# Patient Record
Sex: Female | Born: 1940 | Race: White | Hispanic: No | Smoking: Never smoker
Health system: Southern US, Community
[De-identification: ages and names within clinical notes are randomized; demographics above are authoritative.]

## PROBLEM LIST (undated history)

## (undated) DIAGNOSIS — C801 Malignant (primary) neoplasm, unspecified: Secondary | ICD-10-CM

## (undated) DIAGNOSIS — F329 Major depressive disorder, single episode, unspecified: Secondary | ICD-10-CM

## (undated) DIAGNOSIS — I209 Angina pectoris, unspecified: Secondary | ICD-10-CM

## (undated) DIAGNOSIS — M549 Dorsalgia, unspecified: Secondary | ICD-10-CM

## (undated) DIAGNOSIS — I10 Essential (primary) hypertension: Secondary | ICD-10-CM

## (undated) DIAGNOSIS — F419 Anxiety disorder, unspecified: Secondary | ICD-10-CM

## (undated) DIAGNOSIS — R1013 Epigastric pain: Secondary | ICD-10-CM

## (undated) DIAGNOSIS — C9110 Chronic lymphocytic leukemia of B-cell type not having achieved remission: Secondary | ICD-10-CM

## (undated) DIAGNOSIS — R519 Headache, unspecified: Secondary | ICD-10-CM

## (undated) DIAGNOSIS — N949 Unspecified condition associated with female genital organs and menstrual cycle: Secondary | ICD-10-CM

## (undated) DIAGNOSIS — F32A Depression, unspecified: Secondary | ICD-10-CM

## (undated) DIAGNOSIS — R7303 Prediabetes: Secondary | ICD-10-CM

## (undated) DIAGNOSIS — Z8669 Personal history of other diseases of the nervous system and sense organs: Secondary | ICD-10-CM

## (undated) DIAGNOSIS — D472 Monoclonal gammopathy: Secondary | ICD-10-CM

## (undated) DIAGNOSIS — E7800 Pure hypercholesterolemia, unspecified: Secondary | ICD-10-CM

## (undated) DIAGNOSIS — G4730 Sleep apnea, unspecified: Secondary | ICD-10-CM

## (undated) DIAGNOSIS — I739 Peripheral vascular disease, unspecified: Secondary | ICD-10-CM

## (undated) DIAGNOSIS — K649 Unspecified hemorrhoids: Secondary | ICD-10-CM

## (undated) DIAGNOSIS — H3530 Unspecified macular degeneration: Secondary | ICD-10-CM

## (undated) DIAGNOSIS — E785 Hyperlipidemia, unspecified: Secondary | ICD-10-CM

## (undated) DIAGNOSIS — E669 Obesity, unspecified: Secondary | ICD-10-CM

## (undated) DIAGNOSIS — M51369 Other intervertebral disc degeneration, lumbar region without mention of lumbar back pain or lower extremity pain: Secondary | ICD-10-CM

## (undated) DIAGNOSIS — G2581 Restless legs syndrome: Secondary | ICD-10-CM

## (undated) DIAGNOSIS — M1990 Unspecified osteoarthritis, unspecified site: Secondary | ICD-10-CM

## (undated) DIAGNOSIS — D47Z9 Other specified neoplasms of uncertain behavior of lymphoid, hematopoietic and related tissue: Secondary | ICD-10-CM

## (undated) DIAGNOSIS — N189 Chronic kidney disease, unspecified: Secondary | ICD-10-CM

## (undated) DIAGNOSIS — I2699 Other pulmonary embolism without acute cor pulmonale: Secondary | ICD-10-CM

## (undated) DIAGNOSIS — K219 Gastro-esophageal reflux disease without esophagitis: Secondary | ICD-10-CM

## (undated) DIAGNOSIS — R51 Headache: Secondary | ICD-10-CM

## (undated) HISTORY — PX: APPENDECTOMY: SHX54

## (undated) HISTORY — DX: Unspecified osteoarthritis, unspecified site: M19.90

## (undated) HISTORY — DX: Major depressive disorder, single episode, unspecified: F32.9

## (undated) HISTORY — DX: Chronic lymphocytic leukemia of B-cell type not having achieved remission: C91.10

## (undated) HISTORY — DX: Anxiety disorder, unspecified: F41.9

## (undated) HISTORY — DX: Unspecified hemorrhoids: K64.9

## (undated) HISTORY — DX: Depression, unspecified: F32.A

## (undated) HISTORY — PX: DILATION AND CURETTAGE OF UTERUS: SHX78

## (undated) HISTORY — DX: Monoclonal gammopathy: D47.2

## (undated) HISTORY — PX: COLONOSCOPY: SHX174

## (undated) HISTORY — PX: OPEN REDUCTION INTERNAL FIXATION (ORIF) WRIST WITH ILIAC CREST BONE GRAFT: SHX5669

## (undated) HISTORY — DX: Obesity, unspecified: E66.9

## (undated) HISTORY — DX: Dorsalgia, unspecified: M54.9

## (undated) HISTORY — DX: Hyperlipidemia, unspecified: E78.5

## (undated) HISTORY — PX: OTHER SURGICAL HISTORY: SHX169

## (undated) HISTORY — PX: EYE SURGERY: SHX253

## (undated) HISTORY — DX: Other specified neoplasms of uncertain behavior of lymphoid, hematopoietic and related tissue: D47.Z9

---

## 1998-04-22 HISTORY — PX: WRIST SURGERY: SHX841

## 2004-07-18 ENCOUNTER — Ambulatory Visit: Payer: Self-pay | Admitting: Internal Medicine

## 2004-07-21 ENCOUNTER — Ambulatory Visit: Payer: Self-pay | Admitting: Internal Medicine

## 2004-07-23 ENCOUNTER — Ambulatory Visit: Payer: Self-pay | Admitting: Unknown Physician Specialty

## 2004-07-26 ENCOUNTER — Ambulatory Visit: Payer: Self-pay | Admitting: Unknown Physician Specialty

## 2004-08-19 HISTORY — PX: BREAST BIOPSY: SHX20

## 2004-09-24 ENCOUNTER — Ambulatory Visit: Payer: Self-pay | Admitting: Internal Medicine

## 2004-10-20 ENCOUNTER — Ambulatory Visit: Payer: Self-pay | Admitting: Internal Medicine

## 2004-11-28 ENCOUNTER — Ambulatory Visit: Payer: Self-pay | Admitting: Internal Medicine

## 2004-12-21 ENCOUNTER — Ambulatory Visit: Payer: Self-pay | Admitting: Internal Medicine

## 2005-01-25 ENCOUNTER — Ambulatory Visit: Payer: Self-pay | Admitting: General Surgery

## 2005-05-27 ENCOUNTER — Ambulatory Visit: Payer: Self-pay | Admitting: Internal Medicine

## 2005-06-15 ENCOUNTER — Emergency Department: Payer: Self-pay | Admitting: Emergency Medicine

## 2005-06-20 ENCOUNTER — Ambulatory Visit: Payer: Self-pay | Admitting: Internal Medicine

## 2005-07-30 ENCOUNTER — Ambulatory Visit: Payer: Self-pay | Admitting: Unknown Physician Specialty

## 2005-12-03 ENCOUNTER — Ambulatory Visit: Payer: Self-pay | Admitting: Internal Medicine

## 2005-12-21 ENCOUNTER — Ambulatory Visit: Payer: Self-pay | Admitting: Internal Medicine

## 2006-05-22 ENCOUNTER — Ambulatory Visit: Payer: Self-pay | Admitting: Internal Medicine

## 2006-05-23 ENCOUNTER — Ambulatory Visit: Payer: Self-pay | Admitting: Internal Medicine

## 2006-08-14 ENCOUNTER — Ambulatory Visit: Payer: Self-pay | Admitting: Unknown Physician Specialty

## 2006-08-28 ENCOUNTER — Ambulatory Visit: Payer: Self-pay | Admitting: Gastroenterology

## 2007-04-23 ENCOUNTER — Ambulatory Visit: Payer: Self-pay | Admitting: Internal Medicine

## 2007-05-22 ENCOUNTER — Ambulatory Visit: Payer: Self-pay | Admitting: Internal Medicine

## 2007-05-24 ENCOUNTER — Ambulatory Visit: Payer: Self-pay | Admitting: Internal Medicine

## 2007-07-29 ENCOUNTER — Ambulatory Visit: Payer: Self-pay | Admitting: Oncology

## 2007-08-17 ENCOUNTER — Ambulatory Visit: Payer: Self-pay | Admitting: Unknown Physician Specialty

## 2007-09-21 ENCOUNTER — Ambulatory Visit: Payer: Self-pay | Admitting: Internal Medicine

## 2008-01-01 ENCOUNTER — Other Ambulatory Visit: Payer: Self-pay

## 2008-01-01 ENCOUNTER — Ambulatory Visit: Payer: Self-pay | Admitting: Unknown Physician Specialty

## 2008-01-12 ENCOUNTER — Ambulatory Visit: Payer: Self-pay | Admitting: Unknown Physician Specialty

## 2008-04-22 ENCOUNTER — Ambulatory Visit: Payer: Self-pay | Admitting: Internal Medicine

## 2008-05-16 ENCOUNTER — Ambulatory Visit: Payer: Self-pay | Admitting: Internal Medicine

## 2008-05-23 ENCOUNTER — Ambulatory Visit: Payer: Self-pay | Admitting: Internal Medicine

## 2008-09-01 ENCOUNTER — Ambulatory Visit: Payer: Self-pay | Admitting: Unknown Physician Specialty

## 2009-04-22 ENCOUNTER — Ambulatory Visit: Payer: Self-pay | Admitting: Internal Medicine

## 2009-05-18 ENCOUNTER — Ambulatory Visit: Payer: Self-pay | Admitting: Internal Medicine

## 2009-05-23 ENCOUNTER — Ambulatory Visit: Payer: Self-pay | Admitting: Internal Medicine

## 2009-09-07 ENCOUNTER — Ambulatory Visit: Payer: Self-pay | Admitting: Unknown Physician Specialty

## 2010-01-22 ENCOUNTER — Ambulatory Visit: Payer: Self-pay | Admitting: Unknown Physician Specialty

## 2010-02-01 ENCOUNTER — Ambulatory Visit: Payer: Self-pay | Admitting: Unknown Physician Specialty

## 2010-02-02 LAB — PATHOLOGY REPORT

## 2010-05-17 ENCOUNTER — Ambulatory Visit: Payer: Self-pay | Admitting: Internal Medicine

## 2010-05-23 ENCOUNTER — Ambulatory Visit: Payer: Self-pay | Admitting: Internal Medicine

## 2010-09-11 ENCOUNTER — Ambulatory Visit: Payer: Self-pay | Admitting: Unknown Physician Specialty

## 2010-10-03 ENCOUNTER — Other Ambulatory Visit: Payer: Self-pay | Admitting: Cardiology

## 2010-10-04 ENCOUNTER — Ambulatory Visit: Payer: Self-pay | Admitting: Cardiology

## 2010-12-26 ENCOUNTER — Ambulatory Visit: Payer: Self-pay | Admitting: Unknown Physician Specialty

## 2011-02-26 ENCOUNTER — Inpatient Hospital Stay: Payer: Self-pay | Admitting: Internal Medicine

## 2011-06-24 ENCOUNTER — Ambulatory Visit: Payer: Self-pay | Admitting: Internal Medicine

## 2011-07-04 LAB — CBC CANCER CENTER
Basophil #: 0.1 x10 3/mm (ref 0.0–0.1)
Eosinophil #: 0.4 x10 3/mm (ref 0.0–0.7)
Eosinophil %: 2.2 %
Lymphocyte %: 63.9 %
MCV: 91.2 fL (ref 80–100)
Monocyte %: 4.7 %
Neutrophil %: 28.6 %
Platelet: 221 x10 3/mm (ref 150–440)
RBC: 4.5 10*6/uL (ref 3.80–5.20)
RDW: 14.2 % (ref 11.5–14.5)
WBC: 18.1 x10 3/mm — ABNORMAL HIGH (ref 3.6–11.0)

## 2011-07-04 LAB — CREATININE, SERUM
Creatinine: 1.24 mg/dL (ref 0.60–1.30)
EGFR (African American): 55 — ABNORMAL LOW
EGFR (Non-African Amer.): 46 — ABNORMAL LOW

## 2011-07-22 ENCOUNTER — Ambulatory Visit: Payer: Self-pay | Admitting: Internal Medicine

## 2011-07-24 ENCOUNTER — Ambulatory Visit: Payer: Self-pay | Admitting: Specialist

## 2011-09-13 ENCOUNTER — Ambulatory Visit: Payer: Self-pay | Admitting: Internal Medicine

## 2012-04-29 ENCOUNTER — Emergency Department: Payer: Self-pay | Admitting: Emergency Medicine

## 2012-04-29 LAB — URINALYSIS, COMPLETE
Bilirubin,UR: NEGATIVE
Blood: NEGATIVE
Nitrite: NEGATIVE
Ph: 5 (ref 4.5–8.0)
Protein: NEGATIVE
Specific Gravity: 1.016 (ref 1.003–1.030)
Squamous Epithelial: 2
WBC UR: 4 /HPF (ref 0–5)

## 2012-04-29 LAB — COMPREHENSIVE METABOLIC PANEL
Alkaline Phosphatase: 169 U/L — ABNORMAL HIGH (ref 50–136)
Anion Gap: 6 — ABNORMAL LOW (ref 7–16)
BUN: 21 mg/dL — ABNORMAL HIGH (ref 7–18)
Calcium, Total: 8.7 mg/dL (ref 8.5–10.1)
Chloride: 107 mmol/L (ref 98–107)
Co2: 28 mmol/L (ref 21–32)
EGFR (African American): >60
Glucose: 129 mg/dL — ABNORMAL HIGH (ref 65–99)
Osmolality: 286 (ref 275–301)
Potassium: 3.9 mmol/L (ref 3.5–5.1)
SGOT(AST): 20 U/L (ref 15–37)
Sodium: 141 mmol/L (ref 136–145)
Total Protein: 7.1 g/dL (ref 6.4–8.2)

## 2012-04-29 LAB — CBC WITH DIFFERENTIAL/PLATELET
Comment - H1-Com1: NORMAL
Comment - H1-Com2: NORMAL
HCT: 36.2 % (ref 35.0–47.0)
MCH: 30.8 pg (ref 26.0–34.0)
MCHC: 34.3 g/dL (ref 32.0–36.0)
MCV: 90 fL (ref 80–100)
Monocytes: 2 %
Platelet: 258 10*3/uL (ref 150–440)
RBC: 4.03 10*6/uL (ref 3.80–5.20)
WBC: 20.6 10*3/uL — ABNORMAL HIGH (ref 3.6–11.0)

## 2012-07-09 ENCOUNTER — Ambulatory Visit: Payer: Self-pay | Admitting: Ophthalmology

## 2012-07-16 ENCOUNTER — Ambulatory Visit: Payer: Self-pay | Admitting: Internal Medicine

## 2012-07-16 LAB — CBC CANCER CENTER
Basophil #: 0.1 x10 3/mm (ref 0.0–0.1)
Eosinophil #: 0.6 x10 3/mm (ref 0.0–0.7)
Eosinophil %: 3.1 %
HCT: 39.7 % (ref 35.0–47.0)
HGB: 12.7 g/dL (ref 12.0–16.0)
Lymphocyte #: 14.5 x10 3/mm — ABNORMAL HIGH (ref 1.0–3.6)
MCH: 29.3 pg (ref 26.0–34.0)
MCHC: 32 g/dL (ref 32.0–36.0)
Neutrophil #: 4.5 x10 3/mm (ref 1.4–6.5)
Neutrophil %: 21.8 %
Platelet: 253 x10 3/mm (ref 150–440)
RBC: 4.33 10*6/uL (ref 3.80–5.20)
RDW: 15.4 % — ABNORMAL HIGH (ref 11.5–14.5)

## 2012-07-16 LAB — CREATININE, SERUM
EGFR (African American): 54 — ABNORMAL LOW
EGFR (Non-African Amer.): 46 — ABNORMAL LOW

## 2012-07-16 LAB — LACTATE DEHYDROGENASE: LDH: 370 U/L — ABNORMAL HIGH (ref 81–246)

## 2012-07-21 ENCOUNTER — Ambulatory Visit: Payer: Self-pay | Admitting: Internal Medicine

## 2012-07-21 LAB — PROT IMMUNOELECTROPHORES(ARMC)

## 2012-07-27 ENCOUNTER — Ambulatory Visit: Payer: Self-pay | Admitting: Ophthalmology

## 2012-09-15 ENCOUNTER — Ambulatory Visit: Payer: Self-pay | Admitting: Internal Medicine

## 2013-07-22 ENCOUNTER — Ambulatory Visit: Payer: Self-pay | Admitting: Internal Medicine

## 2013-07-22 LAB — CREATININE, SERUM
Creatinine: 1.13 mg/dL (ref 0.60–1.30)
EGFR (African American): 57 — ABNORMAL LOW
GFR CALC NON AF AMER: 49 — AB

## 2013-07-22 LAB — CBC CANCER CENTER
BASOS ABS: 0.1 x10 3/mm (ref 0.0–0.1)
BASOS PCT: 0.5 %
EOS PCT: 1.5 %
Eosinophil #: 0.4 x10 3/mm (ref 0.0–0.7)
HCT: 39.8 % (ref 35.0–47.0)
HGB: 13 g/dL (ref 12.0–16.0)
LYMPHS ABS: 18.6 x10 3/mm — AB (ref 1.0–3.6)
LYMPHS PCT: 74.7 %
MCH: 30.3 pg (ref 26.0–34.0)
MCHC: 32.7 g/dL (ref 32.0–36.0)
MCV: 93 fL (ref 80–100)
Monocyte #: 1.1 x10 3/mm — ABNORMAL HIGH (ref 0.2–0.9)
Monocyte %: 4.5 %
NEUTROS ABS: 4.7 x10 3/mm (ref 1.4–6.5)
Neutrophil %: 18.8 %
Platelet: 219 x10 3/mm (ref 150–440)
RBC: 4.29 10*6/uL (ref 3.80–5.20)
RDW: 15 % — ABNORMAL HIGH (ref 11.5–14.5)
WBC: 24.9 x10 3/mm — AB (ref 3.6–11.0)

## 2013-07-22 LAB — LACTATE DEHYDROGENASE: LDH: 228 U/L (ref 81–246)

## 2013-08-17 ENCOUNTER — Ambulatory Visit: Payer: Self-pay | Admitting: Internal Medicine

## 2013-08-20 ENCOUNTER — Ambulatory Visit: Payer: Self-pay | Admitting: Internal Medicine

## 2013-08-20 ENCOUNTER — Ambulatory Visit: Admit: 2013-08-20 | Disposition: A | Discharge status: Home / Self Care | Payer: Self-pay | Admitting: Internal Medicine

## 2013-09-16 ENCOUNTER — Ambulatory Visit: Payer: Self-pay | Admitting: Internal Medicine

## 2014-01-20 ENCOUNTER — Ambulatory Visit: Payer: Self-pay | Admitting: Internal Medicine

## 2014-01-20 LAB — CBC CANCER CENTER
Basophil #: 0.1 x10 3/mm (ref 0.0–0.1)
Basophil %: 0.5 %
EOS ABS: 0.4 x10 3/mm (ref 0.0–0.7)
Eosinophil %: 1.6 %
HCT: 40.7 % (ref 35.0–47.0)
HGB: 13.1 g/dL (ref 12.0–16.0)
LYMPHS ABS: 17.5 x10 3/mm — AB (ref 1.0–3.6)
Lymphocyte %: 74 %
MCH: 30 pg (ref 26.0–34.0)
MCHC: 32.1 g/dL (ref 32.0–36.0)
MCV: 94 fL (ref 80–100)
Monocyte #: 1.1 x10 3/mm — ABNORMAL HIGH (ref 0.2–0.9)
Monocyte %: 4.5 %
Neutrophil #: 4.6 x10 3/mm (ref 1.4–6.5)
Neutrophil %: 19.4 %
Platelet: 252 x10 3/mm (ref 150–440)
RBC: 4.36 10*6/uL (ref 3.80–5.20)
RDW: 15.2 % — ABNORMAL HIGH (ref 11.5–14.5)
WBC: 23.6 x10 3/mm — ABNORMAL HIGH (ref 3.6–11.0)

## 2014-01-23 DIAGNOSIS — C9110 Chronic lymphocytic leukemia of B-cell type not having achieved remission: Secondary | ICD-10-CM | POA: Insufficient documentation

## 2014-01-23 DIAGNOSIS — Z8669 Personal history of other diseases of the nervous system and sense organs: Secondary | ICD-10-CM | POA: Insufficient documentation

## 2014-01-23 DIAGNOSIS — K219 Gastro-esophageal reflux disease without esophagitis: Secondary | ICD-10-CM | POA: Insufficient documentation

## 2014-01-23 DIAGNOSIS — I129 Hypertensive chronic kidney disease with stage 1 through stage 4 chronic kidney disease, or unspecified chronic kidney disease: Secondary | ICD-10-CM | POA: Insufficient documentation

## 2014-01-23 DIAGNOSIS — M159 Polyosteoarthritis, unspecified: Secondary | ICD-10-CM | POA: Insufficient documentation

## 2014-01-23 DIAGNOSIS — G4733 Obstructive sleep apnea (adult) (pediatric): Secondary | ICD-10-CM | POA: Insufficient documentation

## 2014-01-23 DIAGNOSIS — E7800 Pure hypercholesterolemia, unspecified: Secondary | ICD-10-CM | POA: Insufficient documentation

## 2014-01-23 DIAGNOSIS — N183 Chronic kidney disease, stage 3 unspecified: Secondary | ICD-10-CM | POA: Insufficient documentation

## 2014-02-06 DIAGNOSIS — F325 Major depressive disorder, single episode, in full remission: Secondary | ICD-10-CM | POA: Insufficient documentation

## 2014-02-20 ENCOUNTER — Ambulatory Visit: Payer: Self-pay | Admitting: Internal Medicine

## 2014-06-07 DIAGNOSIS — N183 Chronic kidney disease, stage 3 (moderate): Secondary | ICD-10-CM | POA: Diagnosis not present

## 2014-06-07 DIAGNOSIS — E780 Pure hypercholesterolemia: Secondary | ICD-10-CM | POA: Diagnosis not present

## 2014-06-07 DIAGNOSIS — I129 Hypertensive chronic kidney disease with stage 1 through stage 4 chronic kidney disease, or unspecified chronic kidney disease: Secondary | ICD-10-CM | POA: Diagnosis not present

## 2014-06-14 DIAGNOSIS — G4733 Obstructive sleep apnea (adult) (pediatric): Secondary | ICD-10-CM | POA: Diagnosis not present

## 2014-06-14 DIAGNOSIS — C9110 Chronic lymphocytic leukemia of B-cell type not having achieved remission: Secondary | ICD-10-CM | POA: Diagnosis not present

## 2014-06-14 DIAGNOSIS — I129 Hypertensive chronic kidney disease with stage 1 through stage 4 chronic kidney disease, or unspecified chronic kidney disease: Secondary | ICD-10-CM | POA: Diagnosis not present

## 2014-06-14 DIAGNOSIS — F3341 Major depressive disorder, recurrent, in partial remission: Secondary | ICD-10-CM | POA: Diagnosis not present

## 2014-07-28 ENCOUNTER — Ambulatory Visit
Admit: 2014-07-28 | Disposition: A | Discharge status: Home / Self Care | Payer: Self-pay | Attending: Internal Medicine | Admitting: Internal Medicine

## 2014-07-28 DIAGNOSIS — D759 Disease of blood and blood-forming organs, unspecified: Secondary | ICD-10-CM | POA: Diagnosis not present

## 2014-07-28 DIAGNOSIS — D472 Monoclonal gammopathy: Secondary | ICD-10-CM | POA: Diagnosis not present

## 2014-07-28 DIAGNOSIS — D72829 Elevated white blood cell count, unspecified: Secondary | ICD-10-CM | POA: Diagnosis not present

## 2014-07-28 DIAGNOSIS — M549 Dorsalgia, unspecified: Secondary | ICD-10-CM | POA: Diagnosis not present

## 2014-07-28 DIAGNOSIS — Z79899 Other long term (current) drug therapy: Secondary | ICD-10-CM | POA: Diagnosis not present

## 2014-07-28 DIAGNOSIS — E785 Hyperlipidemia, unspecified: Secondary | ICD-10-CM | POA: Diagnosis not present

## 2014-07-28 DIAGNOSIS — R232 Flushing: Secondary | ICD-10-CM | POA: Diagnosis not present

## 2014-07-28 DIAGNOSIS — G8929 Other chronic pain: Secondary | ICD-10-CM | POA: Diagnosis not present

## 2014-07-28 DIAGNOSIS — R5383 Other fatigue: Secondary | ICD-10-CM | POA: Diagnosis not present

## 2014-07-28 LAB — CBC CANCER CENTER
BASOS PCT: 0.6 %
Basophil #: 0.1 x10 3/mm (ref 0.0–0.1)
EOS PCT: 1.7 %
Eosinophil #: 0.4 x10 3/mm (ref 0.0–0.7)
HCT: 41.5 % (ref 35.0–47.0)
HGB: 13.3 g/dL (ref 12.0–16.0)
LYMPHS ABS: 17.7 x10 3/mm — AB (ref 1.0–3.6)
Lymphocyte %: 76.6 %
MCH: 29.5 pg (ref 26.0–34.0)
MCHC: 32.2 g/dL (ref 32.0–36.0)
MCV: 92 fL (ref 80–100)
MONOS PCT: 3.3 %
Monocyte #: 0.8 x10 3/mm (ref 0.2–0.9)
NEUTROS ABS: 4.1 x10 3/mm (ref 1.4–6.5)
Neutrophil %: 17.8 %
Platelet: 219 x10 3/mm (ref 150–440)
RBC: 4.52 10*6/uL (ref 3.80–5.20)
RDW: 14.9 % — ABNORMAL HIGH (ref 11.5–14.5)
WBC: 23.1 x10 3/mm — AB (ref 3.6–11.0)

## 2014-07-28 LAB — CREATININE, SERUM
Creatinine: 1.02 mg/dL — ABNORMAL HIGH
EGFR (African American): >60
EGFR (Non-African Amer.): 55 — ABNORMAL LOW

## 2014-07-28 LAB — LACTATE DEHYDROGENASE: LDH: 195 U/L — ABNORMAL HIGH

## 2014-08-01 LAB — PROT IMMUNOELECTROPHORES(ARMC)

## 2014-08-05 ENCOUNTER — Other Ambulatory Visit: Payer: Self-pay | Admitting: Internal Medicine

## 2014-08-05 DIAGNOSIS — Z1231 Encounter for screening mammogram for malignant neoplasm of breast: Secondary | ICD-10-CM

## 2014-08-12 NOTE — Op Note (Signed)
PATIENT NAME:  Jamie Phillips, Jamie Phillips MR#:  [id number] DATE OF BIRTH:  1940-07-14  DATE OF PROCEDURE:  07/27/2012  PREOPERATIVE DIAGNOSIS: Cataract , right eye.   POSTOPERATIVE DIAGNOSIS: Cataract , right eye.   PROCEDURE PERFORMED: Extracapsular cataract extraction using phacoemulsification with placement Alcon [id number], 23.0 diopter posterior chamber lens, serial number [id number].   SURGEON: Loura Back. Kira Hartl, MD  ANESTHESIA: 4% lidocaine and 0.75% Marcaine in a 50:50 mixture with 10 units/mL of Hylenex added, given as a peribulbar.   ANESTHESIOLOGIST: Dr. Andree Elk.   COMPLICATIONS: None.   ESTIMATED BLOOD LOSS: Less than 1 mL.   CDE: 28.70.  DESCRIPTION OF PROCEDURE:  The patient was brought to the operating room and given a peribulbar block.  The patient was then prepped and draped in the usual fashion.  The vertical rectus muscles were imbricated using 5-0 silk sutures.  These sutures were then clamped to the sterile drapes as bridle sutures.  A limbal peritomy was performed extending two clock hours and hemostasis was obtained with cautery.  A partial thickness scleral groove was made at the surgical limbus and dissected anteriorly in a lamellar dissection using an Alcon crescent knife.  The anterior chamber was entered superonasally with a Superblade and through the lamellar dissection with a 2.6 mm keratome.  DisCoVisc was used to replace the aqueous and a continuous tear capsulorrhexis was carried out.  Hydrodissection and hydrodelineation were carried out with balanced salt and a 27 gauge canula.  The nucleus was rotated to confirm the effectiveness of the hydrodissection.  Phacoemulsification was carried out using a divide-and-conquer technique.  Total ultrasound time was 1 minute and 7.6 seconds with an average power of 23 percent.  Irrigation/aspiration was used to remove the residual cortex.  DisCoVisc was used to inflate the capsule and the internal incision was enlarged to 3 mm  with the crescent knife.  The intraocular lens was folded and inserted into the capsular bag using the AcrySert delivery system.  Irrigation/aspiration was used to remove the residual DisCoVisc.  Miostat was injected into the anterior chamber through the paracentesis track to inflate the anterior chamber and induce miosis.  10 mL of cefuroxime was placed in the eye via the paracentesis track.  The wound was checked for leaks and none were found. The conjunctiva was closed with cautery and the bridle sutures were removed.  Two drops of 0.3% Vigamox were placed on the eye.   An eye shield was placed on the eye.  The patient was discharged to the recovery room in good condition.    ____________________________ Loura Back Jacari Kirsten, MD sad:es D: 07/27/2012 14:26:27 ET T: 07/27/2012 14:53:57 ET JOB#: [id number]  cc: Remo Lipps A. Daylene Vandenbosch, MD, <Dictator> Martie Lee MD ELECTRONICALLY SIGNED 08/03/2012 13:28

## 2014-09-20 ENCOUNTER — Ambulatory Visit
Admission: RE | Admit: 2014-09-20 | Discharge: 2014-09-20 | Disposition: A | Discharge status: Home / Self Care | Payer: Commercial Managed Care - HMO | Source: Ambulatory Visit | Attending: Internal Medicine | Admitting: Internal Medicine

## 2014-09-20 DIAGNOSIS — R922 Inconclusive mammogram: Secondary | ICD-10-CM | POA: Insufficient documentation

## 2014-09-20 DIAGNOSIS — Z1231 Encounter for screening mammogram for malignant neoplasm of breast: Secondary | ICD-10-CM | POA: Diagnosis not present

## 2014-09-20 HISTORY — DX: Malignant (primary) neoplasm, unspecified: C80.1

## 2014-10-06 DIAGNOSIS — N183 Chronic kidney disease, stage 3 (moderate): Secondary | ICD-10-CM | POA: Diagnosis not present

## 2014-10-06 DIAGNOSIS — R739 Hyperglycemia, unspecified: Secondary | ICD-10-CM | POA: Diagnosis not present

## 2014-10-06 DIAGNOSIS — I129 Hypertensive chronic kidney disease with stage 1 through stage 4 chronic kidney disease, or unspecified chronic kidney disease: Secondary | ICD-10-CM | POA: Diagnosis not present

## 2014-10-06 DIAGNOSIS — E780 Pure hypercholesterolemia: Secondary | ICD-10-CM | POA: Diagnosis not present

## 2014-10-13 DIAGNOSIS — R739 Hyperglycemia, unspecified: Secondary | ICD-10-CM | POA: Insufficient documentation

## 2014-10-13 DIAGNOSIS — F325 Major depressive disorder, single episode, in full remission: Secondary | ICD-10-CM | POA: Diagnosis not present

## 2014-10-13 DIAGNOSIS — Z6841 Body Mass Index (BMI) 40.0 and over, adult: Secondary | ICD-10-CM | POA: Diagnosis not present

## 2014-10-13 DIAGNOSIS — I129 Hypertensive chronic kidney disease with stage 1 through stage 4 chronic kidney disease, or unspecified chronic kidney disease: Secondary | ICD-10-CM | POA: Diagnosis not present

## 2014-10-13 DIAGNOSIS — E780 Pure hypercholesterolemia: Secondary | ICD-10-CM | POA: Diagnosis not present

## 2014-10-13 DIAGNOSIS — R7303 Prediabetes: Secondary | ICD-10-CM | POA: Insufficient documentation

## 2015-01-10 DIAGNOSIS — M898X6 Other specified disorders of bone, lower leg: Secondary | ICD-10-CM | POA: Diagnosis not present

## 2015-01-10 DIAGNOSIS — M7052 Other bursitis of knee, left knee: Secondary | ICD-10-CM | POA: Diagnosis not present

## 2015-01-10 DIAGNOSIS — M7051 Other bursitis of knee, right knee: Secondary | ICD-10-CM | POA: Diagnosis not present

## 2015-01-24 ENCOUNTER — Telehealth: Payer: Self-pay | Admitting: Internal Medicine

## 2015-01-24 ENCOUNTER — Other Ambulatory Visit: Payer: Self-pay | Admitting: *Deleted

## 2015-01-24 DIAGNOSIS — D47Z9 Other specified neoplasms of uncertain behavior of lymphoid, hematopoietic and related tissue: Secondary | ICD-10-CM

## 2015-01-24 NOTE — Telephone Encounter (Signed)
Jamie Phillips is very concerned because Jamie Phillips has been feeling very weak, sweating, said Jamie Phillips "doesn't feel right" and knows "something is wrong with me." Jamie Phillips said Jamie Phillips really wants to see Dr. Ma Hillock. There is already an appointment for her scheduled for labwork this Thursday. Can I add her on to see MD as well? Please advise.

## 2015-01-24 NOTE — Telephone Encounter (Signed)
Dr. Ma Hillock is ok with you putting her on the schedule.  Can you put her on for 11 am on Thursday. That would be great.

## 2015-01-26 ENCOUNTER — Inpatient Hospital Stay (HOSPITAL_BASED_OUTPATIENT_CLINIC_OR_DEPARTMENT_OTHER): Payer: Commercial Managed Care - HMO | Admitting: Internal Medicine

## 2015-01-26 ENCOUNTER — Encounter: Payer: Self-pay | Admitting: Internal Medicine

## 2015-01-26 ENCOUNTER — Other Ambulatory Visit: Payer: Commercial Managed Care - HMO

## 2015-01-26 ENCOUNTER — Inpatient Hospital Stay: Payer: Commercial Managed Care - HMO | Attending: Internal Medicine

## 2015-01-26 VITALS — BP 150/67 | HR 55 | Temp 97.2°F | Resp 18 | Wt 281.5 lb

## 2015-01-26 DIAGNOSIS — M549 Dorsalgia, unspecified: Secondary | ICD-10-CM | POA: Diagnosis not present

## 2015-01-26 DIAGNOSIS — R61 Generalized hyperhidrosis: Secondary | ICD-10-CM | POA: Insufficient documentation

## 2015-01-26 DIAGNOSIS — F329 Major depressive disorder, single episode, unspecified: Secondary | ICD-10-CM | POA: Insufficient documentation

## 2015-01-26 DIAGNOSIS — R0602 Shortness of breath: Secondary | ICD-10-CM | POA: Insufficient documentation

## 2015-01-26 DIAGNOSIS — Z79899 Other long term (current) drug therapy: Secondary | ICD-10-CM | POA: Diagnosis not present

## 2015-01-26 DIAGNOSIS — G4730 Sleep apnea, unspecified: Secondary | ICD-10-CM | POA: Insufficient documentation

## 2015-01-26 DIAGNOSIS — R5383 Other fatigue: Secondary | ICD-10-CM | POA: Insufficient documentation

## 2015-01-26 DIAGNOSIS — G8929 Other chronic pain: Secondary | ICD-10-CM

## 2015-01-26 DIAGNOSIS — D472 Monoclonal gammopathy: Secondary | ICD-10-CM | POA: Diagnosis not present

## 2015-01-26 DIAGNOSIS — E785 Hyperlipidemia, unspecified: Secondary | ICD-10-CM | POA: Insufficient documentation

## 2015-01-26 DIAGNOSIS — M7989 Other specified soft tissue disorders: Secondary | ICD-10-CM | POA: Insufficient documentation

## 2015-01-26 DIAGNOSIS — F419 Anxiety disorder, unspecified: Secondary | ICD-10-CM | POA: Insufficient documentation

## 2015-01-26 DIAGNOSIS — E669 Obesity, unspecified: Secondary | ICD-10-CM | POA: Diagnosis not present

## 2015-01-26 DIAGNOSIS — D72829 Elevated white blood cell count, unspecified: Secondary | ICD-10-CM

## 2015-01-26 DIAGNOSIS — C9110 Chronic lymphocytic leukemia of B-cell type not having achieved remission: Secondary | ICD-10-CM

## 2015-01-26 DIAGNOSIS — M129 Arthropathy, unspecified: Secondary | ICD-10-CM

## 2015-01-26 DIAGNOSIS — D47Z9 Other specified neoplasms of uncertain behavior of lymphoid, hematopoietic and related tissue: Secondary | ICD-10-CM

## 2015-01-26 LAB — CBC WITH DIFFERENTIAL/PLATELET
Basophils Absolute: 0.1 10*3/uL (ref 0–0.1)
Basophils Relative: 1 %
Eosinophils Absolute: 0.3 10*3/uL (ref 0–0.7)
Eosinophils Relative: 2 %
HCT: 39 % (ref 35.0–47.0)
Hemoglobin: 12.7 g/dL (ref 12.0–16.0)
LYMPHS ABS: 15.6 10*3/uL — AB (ref 1.0–3.6)
LYMPHS PCT: 72 %
MCH: 30.1 pg (ref 26.0–34.0)
MCHC: 32.6 g/dL (ref 32.0–36.0)
MCV: 92.2 fL (ref 80.0–100.0)
MONO ABS: 0.8 10*3/uL (ref 0.2–0.9)
MONOS PCT: 4 %
Neutro Abs: 4.9 10*3/uL (ref 1.4–6.5)
Neutrophils Relative %: 23 %
Platelets: 232 10*3/uL (ref 150–440)
RBC: 4.23 MIL/uL (ref 3.80–5.20)
RDW: 14.7 % — ABNORMAL HIGH (ref 11.5–14.5)
WBC: 21.7 10*3/uL — AB (ref 3.6–11.0)

## 2015-01-26 LAB — CREATININE, SERUM
Creatinine, Ser: 1.02 mg/dL — ABNORMAL HIGH (ref 0.44–1.00)
GFR calc non Af Amer: 53 mL/min — ABNORMAL LOW (ref >60)

## 2015-01-26 LAB — HEPATIC FUNCTION PANEL
ALK PHOS: 90 U/L (ref 38–126)
ALT: 15 U/L (ref 14–54)
AST: 15 U/L (ref 15–41)
Albumin: 3.6 g/dL (ref 3.5–5.0)
TOTAL PROTEIN: 6.4 g/dL — AB (ref 6.5–8.1)
Total Bilirubin: 0.6 mg/dL (ref 0.3–1.2)

## 2015-01-26 LAB — LACTATE DEHYDROGENASE: LDH: 180 U/L (ref 98–192)

## 2015-01-26 NOTE — Progress Notes (Signed)
Pt tired all the time, she sleeps a lot and never feels rested.  She has to take care of her 73 yr old grandchild that is the only thing that keeps her going.  She ahs some bursitis in her knees. She gets up several times at night to urinate which is normal. No infections, no lymph nodes enlarged.  She has hot flashes several times a day and then at night.

## 2015-01-27 LAB — PROTEIN ELECTROPHORESIS, SERUM
A/G Ratio: 1.3 (ref 0.7–1.7)
ALBUMIN ELP: 3 g/dL (ref 2.9–4.4)
ALPHA-1-GLOBULIN: 0.2 g/dL (ref 0.0–0.4)
ALPHA-2-GLOBULIN: 0.8 g/dL (ref 0.4–1.0)
Beta Globulin: 1 g/dL (ref 0.7–1.3)
Gamma Globulin: 0.4 g/dL (ref 0.4–1.8)
Globulin, Total: 2.4 g/dL (ref 2.2–3.9)
TOTAL PROTEIN ELP: 5.4 g/dL — AB (ref 6.0–8.5)

## 2015-01-27 LAB — TSH: TSH: 1.156 u[IU]/mL (ref 0.350–4.500)

## 2015-01-28 NOTE — Progress Notes (Signed)
Jamie Phillips  [phone] [phone]     ID: [name] OB: 10-10-1940  MR#: [id number]  AST#:[id number]  Patient Care Team: Kirk Ruths, MD as PCP - General (Internal Medicine)  CHIEF COMPLAINT/DIAGNOSIS:  1. 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 2. 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 Negative for CD 10, CD 23, 25, and 38.  SIEP showed monoclonal spike of 0.2 gm/dL IgG 698, IgM 34, IgA 412.  WBC 17,300, ALC 10,300.   HPI:   Patient seen as work in appointment today since she came for lab monitoring and c/o progressive fatigue, dyspnea on exertion and continued sweats both during night and during the day. She otherwise denies any fevers, chills or symptoms of infection. Eating steady, no unintentional weight loss. She denies feeling any enlarged lymph node masses on self exam. Has chronic back pain. No recurrent pneumonia, urinary tract infection or other infections. No new bone pains. No new paresthesias  in extremities.    Review of Systems   As in HPI above. In addition, no fevers or chills. Denies new headaches or focal weakness. No sore throat, cough, shortness of breath, sputum, hemoptysis or chest pain. No dizziness or palpitation. No abdominal pain, diarrhea, dysuria or hematuria. No new bone pain. No skin rash or bleeding symptoms. No new paresthesias in extremities. No polyuria polydipsia.  ROS  PAST MEDICAL HISTORY: Reviewed. Past Medical History  Diagnosis Date  . Cancer ([place])     cll  . CLL (chronic lymphocytic leukemia) ([place])   . MGUS (monoclonal gammopathy of unknown significance)   . Hyperlipidemia   . Arthritis   . Hemorrhoids   . Depression   . Obesity   . Anxiety   . Back pain     PAST SURGICAL HISTORY:  Reviewed. Past Surgical History  Procedure Laterality Date  . Breast biopsy Left 08/19/04    lt bx/clip-neg  . Wrist surgery Left 2000    FAMILY HISTORY: Reviewed. Family History  Problem Relation Age of Onset  . Pulmonary embolism Mother   . Congestive Heart Failure Father     SOCIAL HISTORY: Reviewed. Social History  Substance Use Topics  . Smoking status: Never Smoker   . Smokeless tobacco: Never Used  . Alcohol Use: No    Allergies  Allergen Reactions  . Oxycodone     Other reaction(s): Other (See Comments) Hallucinations  . Morphine     Other reaction(s): Hallucination    Current Outpatient Prescriptions  Medication Sig Dispense Refill  . diclofenac sodium (VOLTAREN) 1 % GEL Apply topically.    Marland Kitchen losartan-hydrochlorothiazide (HYZAAR) 100-12.5 MG tablet Take by mouth.    . pantoprazole (PROTONIX) 40 MG tablet Take by mouth.    . pravastatin (PRAVACHOL) 40 MG tablet TAKE 1 TABLET BY MOUTH ONCE A DAY    . propranolol (INDERAL) 40 MG tablet Take by mouth.    . escitalopram (LEXAPRO) 10 MG tablet     . Multiple Vitamin (MULTI-VITAMINS) TABS Take by mouth.    . VOLTAREN 1 % GEL      No current facility-administered medications for this visit.    PHYSICAL EXAM: Filed Vitals:   01/26/15 1054  BP: 150/67  Pulse: 55  Temp: 97.2 F (36.2 C)  Resp: 18     There is no height  on file to calculate BMI.    GENERAL: Patient is alert and oriented and in no acute distress. There is no icterus or pallor. HEENT: EOMs intact. Oral exam negative for thrush or lesions. No cervical lymphadenopathy. CVS: S1S2, regular LUNGS: Bilaterally clear to auscultation, no rhonchi. ABDOMEN: Soft, nontender. No hepatosplenomegaly clinically.  NEURO: grossly nonfocal, cranial nerves are intact.   EXTREMITIES: b/l mild pedal edema. LYMPHATICS: No palpable adenopathy in axillary or inguinal areas.   LAB RESULTS:    Component Value Date/Time   NA 141 04/29/2012 1822   K 3.9 04/29/2012  1822   CL 107 04/29/2012 1822   CO2 28 04/29/2012 1822   GLUCOSE 129* 04/29/2012 1822   BUN 21* 04/29/2012 1822   CREATININE 1.02* 01/26/2015 1004   CREATININE 1.02* 07/28/2014 0950   CALCIUM 8.7 04/29/2012 1822   PROT 6.4* 01/26/2015 1004   PROT 7.1 04/29/2012 1822   ALBUMIN 3.6 01/26/2015 1004   ALBUMIN 3.3* 04/29/2012 1822   AST 15 01/26/2015 1004   AST 20 04/29/2012 1822   ALT 15 01/26/2015 1004   ALT 33 04/29/2012 1822   ALKPHOS 90 01/26/2015 1004   ALKPHOS 169* 04/29/2012 1822   BILITOT 0.6 01/26/2015 1004   BILITOT 0.3 04/29/2012 1822   GFRNONAA 53* 01/26/2015 1004   GFRNONAA 55* 07/28/2014 0950   GFRNONAA 46* 07/04/2011 0949   GFRAA >60 01/26/2015 1004   GFRAA >60 07/28/2014 0950   GFRAA 55* 07/04/2011 0949    Lab Results  Component Value Date   WBC 21.7* 01/26/2015   NEUTROABS 4.9 01/26/2015   HGB 12.7 01/26/2015   HCT 39.0 01/26/2015   MCV 92.2 01/26/2015   PLT 232 01/26/2015  Today - SIEP negative for M-spike, LDH normal (180), LFT unremarkable except slightly low total protein level, Cr 1.02, TSH normal at 1.156.  STUDIES: 01/26/15 - SIEP negative for M spike (M-spike of 0.1 g/dL in March 2014).  07/28/14 - SIEP negative for M-spike, but Immunofixation shows IgA monoclonal protein with kappa light chain specificity.. Serum IgG slightly low at 585. Serum IgA and IgM are normal.   ASSESSMENT / PLAN:   1. 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 Clinically patient does not have any fevers, unintentional weight loss or recurrent infections. Leukocytosis/lymphocytosis is within a steady range. No anemia, thrombocytopenia, lymphadenopathy, or hepatosplenomegaly. Patient was explained that she does not need any CLL treatment at this time, plan is continued surveillance. She does have sweats both during daytime and nighttime, unclear etiology for this but given that she also has fatigue,  dyspnea on exertion, leg swelling, I have advised patient that she will likely need evaluation to rule out cardiac etiology and also needs to make sure her sleep apnea is under control, patient states that she has appointment with primary physician and will discuss these issues. Will repeat CBC upon her next visit in April.   2. MGUS - prior SIEP showed very small monoclonal spike of 0.1 - 0.2 g/dL, likely related to #1. Repeat SIEP done today does not report quantifiable M-spike. Previously, serum IgG slightly below range. Otherwise no new bone pains, anemia. Serum creatinine is minimally above normal range, patient advised to maintain adequate oral fluid intake. Continue to monitor MGUS upon next MD visit in April. 3. In between she was advised to call MD or come to ER in case of any fevers, lymph nodes on self-exam, unintentional weight loss, worsening symptoms or acute sickness.  Patient is agreeable to this plan.    Leia Alf, MD   01/28/2015 8:40 AM

## 2015-01-30 DIAGNOSIS — F325 Major depressive disorder, single episode, in full remission: Secondary | ICD-10-CM | POA: Diagnosis not present

## 2015-01-30 DIAGNOSIS — I129 Hypertensive chronic kidney disease with stage 1 through stage 4 chronic kidney disease, or unspecified chronic kidney disease: Secondary | ICD-10-CM | POA: Diagnosis not present

## 2015-01-30 DIAGNOSIS — R739 Hyperglycemia, unspecified: Secondary | ICD-10-CM | POA: Diagnosis not present

## 2015-01-30 DIAGNOSIS — C9110 Chronic lymphocytic leukemia of B-cell type not having achieved remission: Secondary | ICD-10-CM | POA: Diagnosis not present

## 2015-01-30 DIAGNOSIS — E7800 Pure hypercholesterolemia, unspecified: Secondary | ICD-10-CM | POA: Diagnosis not present

## 2015-01-30 DIAGNOSIS — N183 Chronic kidney disease, stage 3 (moderate): Secondary | ICD-10-CM | POA: Diagnosis not present

## 2015-01-30 DIAGNOSIS — R5383 Other fatigue: Secondary | ICD-10-CM | POA: Diagnosis not present

## 2015-02-09 DIAGNOSIS — H353130 Nonexudative age-related macular degeneration, bilateral, stage unspecified: Secondary | ICD-10-CM | POA: Diagnosis not present

## 2015-02-14 DIAGNOSIS — G4733 Obstructive sleep apnea (adult) (pediatric): Secondary | ICD-10-CM | POA: Diagnosis not present

## 2015-02-27 ENCOUNTER — Emergency Department: Payer: Commercial Managed Care - HMO

## 2015-02-27 ENCOUNTER — Emergency Department
Admission: EM | Admit: 2015-02-27 | Discharge: 2015-02-27 | Disposition: A | Discharge status: Home / Self Care | Payer: Commercial Managed Care - HMO | Attending: Emergency Medicine | Admitting: Emergency Medicine

## 2015-02-27 ENCOUNTER — Encounter: Payer: Self-pay | Admitting: *Deleted

## 2015-02-27 ENCOUNTER — Ambulatory Visit (INDEPENDENT_AMBULATORY_CARE_PROVIDER_SITE_OTHER)
Admission: EM | Admit: 2015-02-27 | Discharge: 2015-02-27 | Disposition: A | Discharge status: Other Acute Inpatient Hospital | Payer: Commercial Managed Care - HMO | Source: Home / Self Care | Attending: Family Medicine | Admitting: Family Medicine

## 2015-02-27 DIAGNOSIS — R51 Headache: Secondary | ICD-10-CM | POA: Diagnosis not present

## 2015-02-27 DIAGNOSIS — R079 Chest pain, unspecified: Secondary | ICD-10-CM | POA: Diagnosis not present

## 2015-02-27 DIAGNOSIS — R0602 Shortness of breath: Secondary | ICD-10-CM | POA: Diagnosis not present

## 2015-02-27 DIAGNOSIS — R519 Headache, unspecified: Secondary | ICD-10-CM

## 2015-02-27 DIAGNOSIS — I209 Angina pectoris, unspecified: Secondary | ICD-10-CM | POA: Diagnosis not present

## 2015-02-27 HISTORY — DX: Gastro-esophageal reflux disease without esophagitis: K21.9

## 2015-02-27 HISTORY — DX: Essential (primary) hypertension: I10

## 2015-02-27 HISTORY — DX: Pure hypercholesterolemia, unspecified: E78.00

## 2015-02-27 LAB — COMPREHENSIVE METABOLIC PANEL
ALBUMIN: 3.4 g/dL — AB (ref 3.5–5.0)
ALK PHOS: 85 U/L (ref 38–126)
ALT: 16 U/L (ref 14–54)
AST: 19 U/L (ref 15–41)
Anion gap: 10 (ref 5–15)
BUN: 24 mg/dL — ABNORMAL HIGH (ref 6–20)
CHLORIDE: 107 mmol/L (ref 101–111)
CO2: 25 mmol/L (ref 22–32)
Calcium: 8.6 mg/dL — ABNORMAL LOW (ref 8.9–10.3)
Creatinine, Ser: 0.87 mg/dL (ref 0.44–1.00)
GFR calc non Af Amer: >60 mL/min (ref >60)
GLUCOSE: 104 mg/dL — AB (ref 65–99)
Potassium: 4 mmol/L (ref 3.5–5.1)
SODIUM: 142 mmol/L (ref 135–145)
Total Bilirubin: 0.7 mg/dL (ref 0.3–1.2)
Total Protein: 5.9 g/dL — ABNORMAL LOW (ref 6.5–8.1)

## 2015-02-27 LAB — CBC
HEMATOCRIT: 39.7 % (ref 35.0–47.0)
HEMOGLOBIN: 12.9 g/dL (ref 12.0–16.0)
MCH: 30.5 pg (ref 26.0–34.0)
MCHC: 32.5 g/dL (ref 32.0–36.0)
MCV: 93.7 fL (ref 80.0–100.0)
Platelets: 200 10*3/uL (ref 150–440)
RBC: 4.24 MIL/uL (ref 3.80–5.20)
RDW: 15.3 % — ABNORMAL HIGH (ref 11.5–14.5)
WBC: 15 10*3/uL — ABNORMAL HIGH (ref 3.6–11.0)

## 2015-02-27 LAB — TROPONIN I

## 2015-02-27 MED ORDER — HYDROMORPHONE HCL 1 MG/ML IJ SOLN
0.5000 mg | Freq: Once | INTRAMUSCULAR | Status: AC
  Administered 2015-02-27: 0.5 mg via INTRAVENOUS
  Filled 2015-02-27: qty 1

## 2015-02-27 MED ORDER — ACETAMINOPHEN 500 MG PO TABS
1000.0000 mg | ORAL_TABLET | Freq: Once | ORAL | Status: AC
  Administered 2015-02-27: 1000 mg via ORAL
  Filled 2015-02-27: qty 2

## 2015-02-27 MED ORDER — GI COCKTAIL ~~LOC~~
30.0000 mL | Freq: Once | ORAL | Status: AC
  Administered 2015-02-27: 30 mL via ORAL
  Filled 2015-02-27: qty 30

## 2015-02-27 NOTE — Discharge Instructions (Signed)
Nonspecific Chest Pain  °Chest pain can be caused by many different conditions. There is always a chance that your pain could be related to something serious, such as a heart attack or a blood clot in your lungs. Chest pain can also be caused by conditions that are not life-threatening. If you have chest pain, it is very important to follow up with your health care provider. °CAUSES  °Chest pain can be caused by: °· Heartburn. °· Pneumonia or bronchitis. °· Anxiety or stress. °· Inflammation around your heart (pericarditis) or lung (pleuritis or pleurisy). °· A blood clot in your lung. °· A collapsed lung (pneumothorax). It can develop suddenly on its own (spontaneous pneumothorax) or from trauma to the chest. °· Shingles infection (varicella-zoster virus). °· Heart attack. °· Damage to the bones, muscles, and cartilage that make up your chest wall. This can include: °¨ Bruised bones due to injury. °¨ Strained muscles or cartilage due to frequent or repeated coughing or overwork. °¨ Fracture to one or more ribs. °¨ Sore cartilage due to inflammation (costochondritis). °RISK FACTORS  °Risk factors for chest pain may include: °· Activities that increase your risk for trauma or injury to your chest. °· Respiratory infections or conditions that cause frequent coughing. °· Medical conditions or overeating that can cause heartburn. °· Heart disease or family history of heart disease. °· Conditions or health behaviors that increase your risk of developing a blood clot. °· Having had chicken pox (varicella zoster). °SIGNS AND SYMPTOMS °Chest pain can feel like: °· Burning or tingling on the surface of your chest or deep in your chest. °· Crushing, pressure, aching, or squeezing pain. °· Dull or sharp pain that is worse when you move, cough, or take a deep breath. °· Pain that is also felt in your back, neck, shoulder, or arm, or pain that spreads to any of these areas. °Your chest pain may come and go, or it may stay  constant. °DIAGNOSIS °Lab tests or other studies may be needed to find the cause of your pain. Your health care provider may have you take a test called an ambulatory ECG (electrocardiogram). An ECG records your heartbeat patterns at the time the test is performed. You may also have other tests, such as: °· Transthoracic echocardiogram (TTE). During echocardiography, sound waves are used to create a picture of all of the heart structures and to look at how blood flows through your heart. °· Transesophageal echocardiogram (TEE). This is a more advanced imaging test that obtains images from inside your body. It allows your health care provider to see your heart in finer detail. °· Cardiac monitoring. This allows your health care provider to monitor your heart rate and rhythm in real time. °· Holter monitor. This is a portable device that records your heartbeat and can help to diagnose abnormal heartbeats. It allows your health care provider to track your heart activity for several days, if needed. °· Stress tests. These can be done through exercise or by taking medicine that makes your heart beat more quickly. °· Blood tests. °· Imaging tests. °TREATMENT  °Your treatment depends on what is causing your chest pain. Treatment may include: °· Medicines. These may include: °¨ Acid blockers for heartburn. °¨ Anti-inflammatory medicine. °¨ Pain medicine for inflammatory conditions. °¨ Antibiotic medicine, if an infection is present. °¨ Medicines to dissolve blood clots. °¨ Medicines to treat coronary artery disease. °· Supportive care for conditions that do not require medicines. This may include: °¨ Resting. °¨ Applying heat   or cold packs to injured areas. °¨ Limiting activities until pain decreases. °HOME CARE INSTRUCTIONS °· If you were prescribed an antibiotic medicine, finish it all even if you start to feel better. °· Avoid any activities that bring on chest pain. °· Do not use any tobacco products, including  cigarettes, chewing tobacco, or electronic cigarettes. If you need help quitting, ask your health care provider. °· Do not drink alcohol. °· Take medicines only as directed by your health care provider. °· Keep all follow-up visits as directed by your health care provider. This is important. This includes any further testing if your chest pain does not go away. °· If heartburn is the cause for your chest pain, you may be told to keep your head raised (elevated) while sleeping. This reduces the chance that acid will go from your stomach into your esophagus. °· Make lifestyle changes as directed by your health care provider. These may include: °¨ Getting regular exercise. Ask your health care provider to suggest some activities that are safe for you. °¨ Eating a heart-healthy diet. A registered dietitian can help you to learn healthy eating options. °¨ Maintaining a healthy weight. °¨ Managing diabetes, if necessary. °¨ Reducing stress. °SEEK MEDICAL CARE IF: °· Your chest pain does not go away after treatment. °· You have a rash with blisters on your chest. °· You have a fever. °SEEK IMMEDIATE MEDICAL CARE IF:  °· Your chest pain is worse. °· You have an increasing cough, or you cough up blood. °· You have severe abdominal pain. °· You have severe weakness. °· You faint. °· You have chills. °· You have sudden, unexplained chest discomfort. °· You have sudden, unexplained discomfort in your arms, back, neck, or jaw. °· You have shortness of breath at any time. °· You suddenly start to sweat, or your skin gets clammy. °· You feel nauseous or you vomit. °· You suddenly feel light-headed or dizzy. °· Your heart begins to beat quickly, or it feels like it is skipping beats. °These symptoms may represent a serious problem that is an emergency. Do not wait to see if the symptoms will go away. Get medical help right away. Call your local emergency services (911 in the [place] Do not drive yourself to the hospital. °  °This  information is not intended to replace advice given to you by your health care provider. Make sure you discuss any questions you have with your health care provider. °  °Document Released: 01/16/2005 Document Revised: 04/29/2014 Document Reviewed: 11/12/2013 °Elsevier Interactive Patient Education ©2016 [employer] ° °

## 2015-02-27 NOTE — ED Provider Notes (Signed)
[facility] Emergency Department Provider Note     Time seen: ----------------------------------------- 11:15 AM on 02/27/2015 -----------------------------------------    I have reviewed the triage vital signs and the nursing notes.   HISTORY  Chief Complaint No chief complaint on file.    HPI Jamie Phillips is a 74 y.o. female who presents ER with chest pain. Patient states chest pain started acutely this morning around 6 AM, nothing made it better or worse. She did receive nitroglycerin around which did not help her symptoms, she had a little bit of nausea and shortness of breath. She states she has never had a heart attack.   Past Medical History  Diagnosis Date  . Cancer ([place])     cll  . CLL (chronic lymphocytic leukemia) ([place])   . MGUS (monoclonal gammopathy of unknown significance)   . Hyperlipidemia   . Arthritis   . Hemorrhoids   . Depression   . Obesity   . Anxiety   . Back pain     There are no active problems to display for this patient.   Past Surgical History  Procedure Laterality Date  . Breast biopsy Left 08/19/04    lt bx/clip-neg  . Wrist surgery Left 2000    Allergies Oxycodone and Morphine  Social History Social History  Substance Use Topics  . Smoking status: Never Smoker   . Smokeless tobacco: Never Used  . Alcohol Use: No    Review of Systems Constitutional: Negative for fever. Eyes: Negative for visual changes. ENT: Negative for sore throat. Cardiovascular: Positive for chest pain Respiratory: Positive shortness of breath Gastrointestinal: Negative for abdominal pain, vomiting and diarrhea. Genitourinary: Negative for dysuria. Musculoskeletal: Negative for back pain. Skin: Negative for rash. Neurological: Negative for headaches, focal weakness or numbness.  10-point ROS otherwise negative.  ____________________________________________   PHYSICAL EXAM:  VITAL SIGNS: ED Triage Vitals  Enc  Vitals Group     BP --      Pulse --      Resp --      Temp --      Temp src --      SpO2 --      Weight --      Height --      Head Cir --      Peak Flow --      Pain Score --      Pain Loc --      Pain Edu? --      Excl. in [place]? --     Constitutional: Alert and oriented. Well appearing and in no distress. Eyes: Conjunctivae are normal. PERRL. Normal extraocular movements. ENT   Head: Normocephalic and atraumatic.   Nose: No congestion/rhinnorhea.   Mouth/Throat: Mucous membranes are moist.   Neck: No stridor. Cardiovascular: Normal rate, regular rhythm. Normal and symmetric distal pulses are present in all extremities. No murmurs, rubs, or gallops. Respiratory: Normal respiratory effort without tachypnea nor retractions. Breath sounds are clear and equal bilaterally. No wheezes/rales/rhonchi. Gastrointestinal: Soft and nontender. No distention. No abdominal bruits.  Musculoskeletal: Nontender with normal range of motion in all extremities. No joint effusions.  No lower extremity tenderness nor edema. Neurologic:  Normal speech and language. No gross focal neurologic deficits are appreciated. Speech is normal. No gait instability. Skin:  Skin is warm, dry and intact. No rash noted. Psychiatric: Mood and affect are normal. Speech and behavior are normal. Patient exhibits appropriate insight and judgment. ____________________________________________  EKG: Interpreted by me. Normal  sinus rhythm with a rate of 60 bpm, normal PR interval, normal QRS with, normal QT interval. Likely inferior infarct age indeterminate.  ____________________________________________  ED COURSE:  Pertinent labs & imaging results that were available during my care of the patient were reviewed by me and considered in my medical decision making (see chart for details). We'll check cardiac labs and reevaluate. Patient received morphine for pain ____________________________________________     LABS (pertinent positives/negatives)  Labs Reviewed  CBC - Abnormal; Notable for the following:    WBC 15.0 (*)    RDW 15.3 (*)    All other components within normal limits  COMPREHENSIVE METABOLIC PANEL - Abnormal; Notable for the following:    Glucose, Bld 104 (*)    BUN 24 (*)    Calcium 8.6 (*)    Total Protein 5.9 (*)    Albumin 3.4 (*)    All other components within normal limits  TROPONIN I  TROPONIN I    RADIOLOGY Chest x-ray IMPRESSION: Mild cardiac prominence. No edema or consolidation. ____________________________________________  FINAL ASSESSMENT AND PLAN  Chest pain  Plan: Patient with labs and imaging as dictated above. Patient is in no acute distress, low risk according to heart score. Troponin is negative 2, she'll be discharged after receiving a GI cocktail and encouraged to have close follow-up with cardiology for reevaluation.    Earleen Newport, MD   Earleen Newport, MD 02/27/15 [phone]

## 2015-02-27 NOTE — ED Notes (Signed)
Yesterday develioped heavy feeling in chest  Lasted few hours, today had mid chest pain radiated to back

## 2015-02-27 NOTE — ED Notes (Signed)
States when woke at 6am with midsternal chest pain and SOB. Pain Non radiating. Sleeps with C-Pap. C/o severe headache on assessment also. + exertional SOB Color good, skin warm and dry

## 2015-02-27 NOTE — ED Provider Notes (Signed)
CSN: [id number]     Arrival date & time 02/27/15  1004 History   First MD Initiated Contact with Patient 02/27/15 1017    Nurses notes were reviewed. Chief Complaint  Patient presents with  . Chest Pain  . Shortness of Breath  patient is here because of chest pain. She states she had chest pain yesterday chest pain went away. She woke up this morning with more chest pain about 6:00 and 7:00 this morning. She states she is now taken 2 full aspirins she has had some relief of the chest pain now since she is on O2 as well. She still has a headache he states the headache is severe. The chest pain started off at about a 7 is now about a 3. She denies having history of heart disease however she did have a stress test several years ago which she can't tell us results of. She also reports having history of sleep apnea and hypertension. She denies any history of diabetes. States she does not smoke. Father died at age 18 because of cardiac disease. (Consider location/radiation/quality/duration/timing/severity/associated sxs/prior Treatment) Patient is a 74 y.o. female presenting with chest pain and shortness of breath. The history is provided by the patient. No language interpreter was used.  Chest Pain Pain location:  L chest and substernal area Pain quality: radiating   Pain radiates to:  Upper back Pain radiates to the back: yes   Pain severity:  Moderate Onset quality:  Sudden Timing:  Constant Context: at rest   Relieved by:  Aspirin Worsened by:  Nothing tried Ineffective treatments:  None tried Associated symptoms: diaphoresis, headache and shortness of breath   Headaches:    Severity:  Severe   Onset quality:  Sudden   Progression:  Partially resolved   Chronicity:  New Risk factors: hypertension and obesity   Risk factors: no smoking   Shortness of Breath Associated symptoms: chest pain, diaphoresis and headaches     Past Medical History  Diagnosis Date  . Hypertension   . GERD  (gastroesophageal reflux disease)   . Hypercholesteremia    Past Surgical History  Procedure Laterality Date  . Neck fusion     Family History  Problem Relation Age of Onset  . Pulmonary embolism Mother   . Heart failure Father    Social History  Substance Use Topics  . Smoking status: Never Smoker   . Smokeless tobacco: None  . Alcohol Use: No   OB History    No data available     Review of Systems  Constitutional: Positive for diaphoresis. Negative for activity change and appetite change.  Respiratory: Positive for shortness of breath.   Cardiovascular: Positive for chest pain and leg swelling.  Neurological: Positive for headaches.  All other systems reviewed and are negative.   Allergies  Morphine and related and Oxycodone  Home Medications   Prior to Admission medications   Medication Sig Start Date End Date Taking? Authorizing Provider  escitalopram (LEXAPRO) 10 MG tablet Take 10 mg by mouth daily.   Yes Historical Provider, MD  losartan-hydrochlorothiazide (HYZAAR) 100-12.5 MG tablet Take 1 tablet by mouth daily.   Yes Historical Provider, MD  pantoprazole (PROTONIX) 40 MG tablet Take 40 mg by mouth daily.   Yes Historical Provider, MD  pravastatin (PRAVACHOL) 40 MG tablet Take 40 mg by mouth daily.   Yes Historical Provider, MD  propranolol (INDERAL) 40 MG tablet Take 40 mg by mouth 3 (three) times daily.   Yes Historical Provider,  MD   Meds Ordered and Administered this Visit  Medications - No data to display  BP 136/84 mmHg  Pulse 72  Resp 24  Ht 5\' 5"  (1.651 m)  Wt 269 lb (122.018 kg)  BMI 44.76 kg/m2  SpO2 95% No data found. obese white female resting comfortably at this time with oxygen going.  Physical Exam  Constitutional: She is oriented to person, place, and time. She appears well-developed.  HENT:  Head: Normocephalic and atraumatic.  Eyes: Pupils are equal, round, and reactive to light.  Neck: Normal range of motion. Neck supple. No  thyromegaly present.  Cardiovascular: Normal rate, regular rhythm and normal heart sounds.   Pulmonary/Chest: Effort normal and breath sounds normal. No respiratory distress.  Musculoskeletal: Normal range of motion.  Neurological: She is alert and oriented to person, place, and time.  Skin: Skin is warm and dry. No erythema.  Psychiatric: She has a normal mood and affect.  Vitals reviewed.   ED Course  Procedures (including critical care time)  Labs Review Labs Reviewed - No data to display  Imaging Review No results found.   Visual Acuity Review  Right Eye Distance:   Left Eye Distance:   Bilateral Distance:    Right Eye Near:   Left Eye Near:    Bilateral Near:      ED ECG REPORT   Date: 02/27/2015  EKG Time: 10:41 AM  Rate: 62  Rhythm: normal sinus rhythm,    Axis:45  Intervals:none  ST&T Change:none   Narrative Interpretation: Voltage criteria for LVH and possible old anterior infarct to an age           MDM   25. Ischemic chest pain ([place])   2. Acute nonintractable headache, unspecified headache type     Patient will be transferred to [facility] ED for further evaluation of heart and headache. Report given to charge nurse Nira Conn. Patient transferred by EMS  Frederich Cha, MD 02/27/15 1041

## 2015-02-27 NOTE — OR Nursing (Signed)
Took two 324 mg asa around 0630.  Chest pain 4 over 10, still has headache.  22 g in right hand sl, oxugen.  EMS called for transport to [facility].  ekg reviewed by Dr Alveta Heimlich.  Family aware of transport to [facility].

## 2015-02-27 NOTE — Discharge Instructions (Signed)
Heart Attack A heart attack (myocardial infarction, MI) causes damage to the heart that cannot be fixed. A heart attack often happens when a blood clot or other blockage cuts blood flow to the heart. When this happens, certain areas of the heart begin to die. This causes the pain you feel during a heart attack. HOME CARE  Take medicine as told by your doctor. You may need medicine to:  Keep your blood from clotting too easily.  Control your blood pressure.  Lower your cholesterol.  Control abnormal heart rhythms.  Change certain behaviors as told by your doctor. This may include:  Quitting smoking.  Being active.  Eating a heart-healthy diet. Ask your doctor for help with this diet.  Keeping a healthy weight.  Keeping your diabetes under control.  Lessening stress.  Limiting how much alcohol you drink. Do not take these medicines unless your doctor says that you can:  Nonsteroidal anti-inflammatory drugs (NSAIDs). These include:  Ibuprofen.  Naproxen.  Celecoxib.  Vitamin supplements that have vitamin A, vitamin E, or both.  Hormone therapy that contains estrogen with or without progestin. GET HELP RIGHT AWAY IF:  You have sudden chest discomfort.  You have sudden discomfort in your:  Arms.  Back.  Neck.  Jaw.  You have shortness of breath at any time.  You have sudden sweating or clammy skin.  You feel sick to your stomach (nauseous) or throw up (vomit).  You suddenly get light-headed or dizzy.  You feel your heart beating fast or skipping beats. These symptoms may be an emergency. Do not wait to see if the symptoms will go away. Get medical help right away. Call your local emergency services (911 in the [place] Do not drive yourself to the hospital.   This information is not intended to replace advice given to you by your health care provider. Make sure you discuss any questions you have with your health care provider.   Document Released:  10/08/2011 Document Revised: 08/23/2014 Document Reviewed: 06/11/2013 Elsevier Interactive Patient Education [employer].

## 2015-02-27 NOTE — ED Notes (Signed)
Wheelchair offered to patient, patient refused.

## 2015-03-01 DIAGNOSIS — G4733 Obstructive sleep apnea (adult) (pediatric): Secondary | ICD-10-CM | POA: Diagnosis not present

## 2015-03-01 DIAGNOSIS — M7051 Other bursitis of knee, right knee: Secondary | ICD-10-CM | POA: Diagnosis not present

## 2015-03-09 DIAGNOSIS — Z6841 Body Mass Index (BMI) 40.0 and over, adult: Secondary | ICD-10-CM | POA: Diagnosis not present

## 2015-03-09 DIAGNOSIS — J9801 Acute bronchospasm: Secondary | ICD-10-CM | POA: Diagnosis not present

## 2015-03-09 DIAGNOSIS — G4733 Obstructive sleep apnea (adult) (pediatric): Secondary | ICD-10-CM | POA: Diagnosis not present

## 2015-03-09 DIAGNOSIS — R0609 Other forms of dyspnea: Secondary | ICD-10-CM | POA: Diagnosis not present

## 2015-04-13 DIAGNOSIS — E7800 Pure hypercholesterolemia, unspecified: Secondary | ICD-10-CM | POA: Diagnosis not present

## 2015-04-13 DIAGNOSIS — R739 Hyperglycemia, unspecified: Secondary | ICD-10-CM | POA: Diagnosis not present

## 2015-04-20 DIAGNOSIS — R739 Hyperglycemia, unspecified: Secondary | ICD-10-CM | POA: Diagnosis not present

## 2015-04-20 DIAGNOSIS — N183 Chronic kidney disease, stage 3 (moderate): Secondary | ICD-10-CM | POA: Diagnosis not present

## 2015-04-20 DIAGNOSIS — M159 Polyosteoarthritis, unspecified: Secondary | ICD-10-CM | POA: Diagnosis not present

## 2015-04-20 DIAGNOSIS — Z6841 Body Mass Index (BMI) 40.0 and over, adult: Secondary | ICD-10-CM | POA: Diagnosis not present

## 2015-04-20 DIAGNOSIS — I129 Hypertensive chronic kidney disease with stage 1 through stage 4 chronic kidney disease, or unspecified chronic kidney disease: Secondary | ICD-10-CM | POA: Diagnosis not present

## 2015-04-20 DIAGNOSIS — Z0000 Encounter for general adult medical examination without abnormal findings: Secondary | ICD-10-CM | POA: Diagnosis not present

## 2015-04-20 DIAGNOSIS — F325 Major depressive disorder, single episode, in full remission: Secondary | ICD-10-CM | POA: Diagnosis not present

## 2015-05-04 ENCOUNTER — Ambulatory Visit (INDEPENDENT_AMBULATORY_CARE_PROVIDER_SITE_OTHER): Payer: Commercial Managed Care - HMO | Admitting: Cardiovascular Disease

## 2015-05-04 ENCOUNTER — Encounter: Payer: Self-pay | Admitting: Cardiovascular Disease

## 2015-05-04 ENCOUNTER — Telehealth: Payer: Self-pay

## 2015-05-04 VITALS — BP 128/68 | HR 53 | Ht 65.0 in | Wt 293.2 lb

## 2015-05-04 DIAGNOSIS — I10 Essential (primary) hypertension: Secondary | ICD-10-CM | POA: Diagnosis not present

## 2015-05-04 DIAGNOSIS — E785 Hyperlipidemia, unspecified: Secondary | ICD-10-CM | POA: Diagnosis not present

## 2015-05-04 DIAGNOSIS — R0602 Shortness of breath: Secondary | ICD-10-CM | POA: Diagnosis not present

## 2015-05-04 DIAGNOSIS — R079 Chest pain, unspecified: Secondary | ICD-10-CM | POA: Diagnosis not present

## 2015-05-04 NOTE — Progress Notes (Signed)
Primary care physician: Dr. Ouida Sills.  HPI  This is a 75 year old female who was referred from the emergency room at [facility] for evaluation of chest pain. She has no previous cardiac history and reports being seen by Dr. Ubaldo Glassing many years ago with negative workup. The details are not available. 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 She has no history of diabetes or tobacco use. She has no family history of premature coronary artery disease. She was evaluated in November in the emergency room at [facility] for chest pain which was described as an elephant sitting on her chest but at the same time she was having a lot of GI symptoms including nausea, vomiting, diarrhea. There was a viral illness circulating in her house and she was sick for 3 days. She had no recurrent symptoms of chest pain. Her basic workup in the emergency room was unremarkable. Her biggest complaint today is dyspnea with minimal activities which has been getting worse. She reports gradual weight gain over the last 10 years which has significantly affected her ability to perform activities of daily living. No orthopnea or PND. She has mild leg edema.  Allergies  Allergen Reactions  . Oxycodone     Other reaction(s): Other (See Comments) Hallucinations  . Morphine     Other reaction(s): Hallucination  . Morphine And Related Other (See Comments)    Hallucinations  . Oxycodone Other (See Comments)    hallucinations     Current Outpatient Prescriptions on File Prior to Visit  Medication Sig Dispense Refill  . aspirin 325 MG EC tablet Take 650 mg by mouth daily.    Marland Kitchen escitalopram (LEXAPRO) 10 MG tablet Take 10 mg by mouth daily.    Marland Kitchen losartan-hydrochlorothiazide (HYZAAR) 100-12.5 MG tablet Take 1 tablet by mouth daily.    . Multiple Vitamin (MULTI-VITAMINS) TABS Take 1 tablet by mouth daily.     . pravastatin (PRAVACHOL) 40 MG  tablet TAKE 1 TABLET BY MOUTH ONCE A DAY    . pravastatin (PRAVACHOL) 40 MG tablet Take 40 mg by mouth daily.    . propranolol (INDERAL) 40 MG tablet Take 40 mg by mouth 2 (two) times daily.     . VOLTAREN 1 % GEL Apply 2 g topically 4 (four) times daily.      No current facility-administered medications on file prior to visit.     Past Medical History  Diagnosis Date  . Hypertension   . GERD (gastroesophageal reflux disease)   . Hypercholesteremia   . Cancer ([place])     cll  . CLL (chronic lymphocytic leukemia) ([place])   . MGUS (monoclonal gammopathy of unknown significance)   . Hyperlipidemia   . Arthritis   . Hemorrhoids   . Depression   . Obesity   . Anxiety   . Back pain      Past Surgical History  Procedure Laterality Date  . Neck fusion    . Breast biopsy Left 08/19/04    lt bx/clip-neg  . Wrist surgery Left 2000     Family History  Problem Relation Age of Onset  . Heart failure Father   . Pulmonary embolism Mother   . Congestive Heart Failure Father      Social History   Social History  . Marital Status: Widowed    Spouse Name: N/A  . Number of Children: N/A  . Years of Education: N/A   Occupational History  . Not on  file.   Social History Main Topics  . Smoking status: Never Smoker   . Smokeless tobacco: Never Used  . Alcohol Use: No  . Drug Use: No  . Sexual Activity: Not on file   Other Topics Concern  . Not on file   Social History Narrative   ** Merged History Encounter **         ROS A 10 point review of system was performed. It is negative other than that mentioned in the history of present illness.   PHYSICAL EXAM   BP 128/68 mmHg  Pulse 53  Ht 5\' 5"  (1.651 m)  Wt 293 lb 4 oz (133.017 kg)  BMI 48.80 kg/m2 Constitutional: She is oriented to person, place, and time. She appears well-developed and well-nourished. No distress.  HENT: No nasal discharge.  Head: Normocephalic and atraumatic.  Eyes: Pupils are equal and round.  No discharge.  Neck: Normal range of motion. Neck supple. No JVD present. No thyromegaly present.  Cardiovascular: Normal rate, regular rhythm, normal heart sounds. Exam reveals no gallop and no friction rub. No murmur heard.  Pulmonary/Chest: Effort normal and breath sounds normal. No stridor. No respiratory distress. She has no wheezes. She has no rales. She exhibits no tenderness.  Abdominal: Soft. Bowel sounds are normal. She exhibits no distension. There is no tenderness. There is no rebound and no guarding.  Musculoskeletal: Normal range of motion. She exhibits no edema and no tenderness.  Neurological: She is alert and oriented to person, place, and time. Coordination normal.  Skin: Skin is warm and dry. No rash noted. She is not diaphoretic. No erythema. No pallor.  Psychiatric: She has a normal mood and affect. Her behavior is normal. Judgment and thought content normal.     EKG: Sinus bradycardia with prolonged QT interval. No significant ST or T wave changes.   ASSESSMENT AND PLAN

## 2015-05-04 NOTE — Assessment & Plan Note (Signed)
Blood pressure is reasonably controlled on current medications. 

## 2015-05-04 NOTE — Patient Instructions (Addendum)
Medication Instructions:  Your physician recommends that you continue on your current medications as directed. Please refer to the Current Medication list given to you today.   Labwork: none  Testing/Procedures: Your physician has requested that you have a lexiscan myoview. For further information please visit [email or web address]. Please follow instruction sheet, as given.  Jamie Phillips  Your caregiver has ordered a Stress Test with nuclear imaging. The purpose of this test is to evaluate the blood supply to your heart muscle. This procedure is referred to as a "Non-Invasive Stress Test." This is because other than having an IV started in your vein, nothing is inserted or "invades" your body. Cardiac stress tests are done to find areas of poor blood flow to the heart by determining the extent of coronary artery disease (CAD). Some patients exercise on a treadmill, which naturally increases the blood flow to your heart, while others who are  unable to walk on a treadmill due to physical limitations have a pharmacologic/chemical stress agent called Lexiscan . This medicine will mimic walking on a treadmill by temporarily increasing your coronary blood flow.   Please note: these test may take anywhere between 2-4 hours to complete  PLEASE REPORT TO [place] AT THE FIRST DESK WILL DIRECT YOU WHERE TO GO  Date of Procedure: Monday, Jan 16  Arrival Time for Procedure: 8:15am  Instructions regarding medication:      __xx__:  Hold other medications as follows: losartan/HCTZ the morning of your procedure. You may resume after the test.  PLEASE NOTIFY THE OFFICE AT LEAST 24 HOURS IN ADVANCE IF YOU ARE UNABLE TO KEEP YOUR APPOINTMENT.  [phone] AND  PLEASE NOTIFY NUCLEAR MEDICINE AT [facility] AT LEAST 24 HOURS IN ADVANCE IF YOU ARE UNABLE TO KEEP YOUR APPOINTMENT. [phone]  How to prepare for your Myoview test:   Do not eat or drink after midnight  No  caffeine for 24 hours prior to test  No smoking 24 hours prior to test.  Your medication may be taken with water.  If your doctor stopped a medication because of this test, do not take that medication.  Ladies, please do not wear dresses.  Skirts or pants are appropriate. Please wear a short sleeve shirt.  No perfume, cologne or lotion.  Wear comfortable walking shoes. No heels!       Your physician has requested that you have an echocardiogram. Echocardiography is a painless test that uses sound waves to create images of your heart. It provides your doctor with information about the size and shape of your heart and how well your heart's chambers and valves are working. This procedure takes approximately one hour. There are no restrictions for this procedure.       Follow-Up: Your physician recommends that you schedule a follow-up appointment as needed.    Any Other Special Instructions Will Be Listed Below (If Applicable).     If you need a refill on your cardiac medications before your next appointment, please call your pharmacy.  Echocardiogram An echocardiogram, or echocardiography, uses sound waves (ultrasound) to produce an image of your heart. The echocardiogram is simple, painless, obtained within a short period of time, and offers valuable information to your health care provider. The images from an echocardiogram can provide information such as:  Evidence of coronary artery disease (CAD).  Heart size.  Heart muscle function.  Heart valve function.  Aneurysm detection.  Evidence of a past heart attack.  Fluid buildup around the  heart.  Heart muscle thickening.  Assess heart valve function. LET [facility] CARE PROVIDER KNOW ABOUT:  Any allergies you have.  All medicines you are taking, including vitamins, herbs, eye drops, creams, and over-the-counter medicines.  Previous problems you or members of your family have had with the use of  anesthetics.  Any blood disorders you have.  Previous surgeries you have had.  Medical conditions you have.  Possibility of pregnancy, if this applies. BEFORE THE PROCEDURE  No special preparation is needed. Eat and drink normally.  PROCEDURE   In order to produce an image of your heart, gel will be applied to your chest and a wand-like tool (transducer) will be moved over your chest. The gel will help transmit the sound waves from the transducer. The sound waves will harmlessly bounce off your heart to allow the heart images to be captured in real-time motion. These images will then be recorded.  You may need an IV to receive a medicine that improves the quality of the pictures. AFTER THE PROCEDURE You may return to your normal schedule including diet, activities, and medicines, unless your health care provider tells you otherwise.   This information is not intended to replace advice given to you by your health care provider. Make sure you discuss any questions you have with your health care provider.   Document Released: 04/05/2000 Document Revised: 04/29/2014 Document Reviewed: 12/14/2012 Elsevier Interactive Patient Education 2016 [place]. Cardiac Nuclear Scanning A cardiac nuclear scan is used to check your heart for problems, such as the following:  A portion of the heart is not getting enough blood.  Part of the heart muscle has died, which happens with a heart attack.  The heart wall is not working normally.  In this test, a radioactive dye (tracer) is injected into your bloodstream. After the tracer has traveled to your heart, a scanning device is used to measure how much of the tracer is absorbed by or distributed to various areas of your heart. LET [facility] CARE PROVIDER KNOW ABOUT:  Any allergies you have.  All medicines you are taking, including vitamins, herbs, eye drops, creams, and over-the-counter medicines.  Previous problems you or members of your  family have had with the use of anesthetics.  Any blood disorders you have.  Previous surgeries you have had.  Medical conditions you have.  RISKS AND COMPLICATIONS Generally, this is a safe procedure. However, as with any procedure, problems can occur. Possible problems include:   Serious chest pain.  Rapid heartbeat.  Sensation of warmth in your chest. This usually passes quickly. BEFORE THE PROCEDURE Ask your health care provider about changing or stopping your regular medicines. PROCEDURE This procedure is usually done at a hospital and takes 2-4 hours.  An IV tube is inserted into one of your veins.  Your health care provider will inject a small amount of radioactive tracer through the tube.  You will then wait for 20-40 minutes while the tracer travels through your bloodstream.  You will lie down on an exam table so images of your heart can be taken. Images will be taken for about 15-20 minutes.  You will exercise on a treadmill or stationary bike. While you exercise, your heart activity will be monitored with an electrocardiogram (ECG), and your blood pressure will be checked.  If you are unable to exercise, you may be given a medicine to make your heart beat faster.  When blood flow to your heart has peaked, tracer will  again be injected through the IV tube.  After 20-40 minutes, you will get back on the exam table and have more images taken of your heart.  When the procedure is over, your IV tube will be removed. AFTER THE PROCEDURE  You will likely be able to leave shortly after the test. Unless your health care provider tells you otherwise, you may return to your normal schedule, including diet, activities, and medicines.  Make sure you find out how and when you will get your test results.   This information is not intended to replace advice given to you by your health care provider. Make sure you discuss any questions you have with your health care provider.    Document Released: 05/03/2004 Document Revised: 04/13/2013 Document Reviewed: 03/17/2013 Elsevier Interactive Patient Education [employer].

## 2015-05-04 NOTE — Assessment & Plan Note (Signed)
The patient's chest pain was in the setting of what seems to be a viral GI illness. She reports no recurrent episodes of chest pain continues to have dyspnea with minimal activities. Given her multiple risk factors for coronary artery disease, I recommend evaluation with a pharmacologic nuclear stress test. She is not able to exercise on a treadmill due to arthritis. Continue treatment of risk factors.

## 2015-05-04 NOTE — Telephone Encounter (Signed)
S/w pt to inform her to hold propranolol the evening before and morning of her lexi myoview. Pt verbalized understanding with no questions.

## 2015-05-04 NOTE — Assessment & Plan Note (Signed)
The patient's biggest complaint is dyspnea with minimal activities which could be due to obesity and physical deconditioning. I requested an echocardiogram to evaluate diastolic function and pulmonary pressure.

## 2015-05-05 ENCOUNTER — Other Ambulatory Visit: Payer: Self-pay

## 2015-05-05 ENCOUNTER — Telehealth: Payer: Self-pay

## 2015-05-05 ENCOUNTER — Ambulatory Visit (INDEPENDENT_AMBULATORY_CARE_PROVIDER_SITE_OTHER): Payer: Commercial Managed Care - HMO

## 2015-05-05 DIAGNOSIS — R0602 Shortness of breath: Secondary | ICD-10-CM

## 2015-05-05 NOTE — Telephone Encounter (Signed)
Left message on pt home VM to call back to review lexi myoview instructions.

## 2015-05-08 ENCOUNTER — Ambulatory Visit
Admission: RE | Admit: 2015-05-08 | Discharge: 2015-05-08 | Disposition: A | Discharge status: Home / Self Care | Payer: Commercial Managed Care - HMO | Source: Ambulatory Visit | Attending: Cardiovascular Disease | Admitting: Cardiovascular Disease

## 2015-05-08 DIAGNOSIS — R001 Bradycardia, unspecified: Secondary | ICD-10-CM | POA: Diagnosis not present

## 2015-05-08 DIAGNOSIS — R6511 Systemic inflammatory response syndrome (SIRS) of non-infectious origin with acute organ dysfunction: Secondary | ICD-10-CM | POA: Diagnosis not present

## 2015-05-08 DIAGNOSIS — J209 Acute bronchitis, unspecified: Secondary | ICD-10-CM | POA: Diagnosis not present

## 2015-05-08 DIAGNOSIS — K219 Gastro-esophageal reflux disease without esophagitis: Secondary | ICD-10-CM | POA: Diagnosis not present

## 2015-05-08 DIAGNOSIS — E7800 Pure hypercholesterolemia, unspecified: Secondary | ICD-10-CM | POA: Diagnosis present

## 2015-05-08 DIAGNOSIS — E669 Obesity, unspecified: Secondary | ICD-10-CM | POA: Diagnosis present

## 2015-05-08 DIAGNOSIS — I10 Essential (primary) hypertension: Secondary | ICD-10-CM | POA: Diagnosis not present

## 2015-05-08 DIAGNOSIS — R6510 Systemic inflammatory response syndrome (SIRS) of non-infectious origin without acute organ dysfunction: Secondary | ICD-10-CM | POA: Diagnosis present

## 2015-05-08 DIAGNOSIS — Z7982 Long term (current) use of aspirin: Secondary | ICD-10-CM | POA: Diagnosis not present

## 2015-05-08 DIAGNOSIS — F329 Major depressive disorder, single episode, unspecified: Secondary | ICD-10-CM | POA: Diagnosis present

## 2015-05-08 DIAGNOSIS — Z981 Arthrodesis status: Secondary | ICD-10-CM | POA: Diagnosis not present

## 2015-05-08 DIAGNOSIS — I959 Hypotension, unspecified: Secondary | ICD-10-CM | POA: Diagnosis not present

## 2015-05-08 DIAGNOSIS — R0602 Shortness of breath: Secondary | ICD-10-CM | POA: Diagnosis not present

## 2015-05-08 DIAGNOSIS — J9601 Acute respiratory failure with hypoxia: Secondary | ICD-10-CM | POA: Diagnosis not present

## 2015-05-08 DIAGNOSIS — R079 Chest pain, unspecified: Secondary | ICD-10-CM | POA: Diagnosis not present

## 2015-05-08 DIAGNOSIS — F419 Anxiety disorder, unspecified: Secondary | ICD-10-CM | POA: Diagnosis present

## 2015-05-08 DIAGNOSIS — M1990 Unspecified osteoarthritis, unspecified site: Secondary | ICD-10-CM | POA: Diagnosis present

## 2015-05-08 DIAGNOSIS — E785 Hyperlipidemia, unspecified: Secondary | ICD-10-CM | POA: Diagnosis present

## 2015-05-08 DIAGNOSIS — C9110 Chronic lymphocytic leukemia of B-cell type not having achieved remission: Secondary | ICD-10-CM | POA: Diagnosis not present

## 2015-05-08 DIAGNOSIS — R05 Cough: Secondary | ICD-10-CM | POA: Diagnosis not present

## 2015-05-08 DIAGNOSIS — Z885 Allergy status to narcotic agent status: Secondary | ICD-10-CM | POA: Diagnosis not present

## 2015-05-08 DIAGNOSIS — J111 Influenza due to unidentified influenza virus with other respiratory manifestations: Secondary | ICD-10-CM | POA: Diagnosis not present

## 2015-05-08 DIAGNOSIS — Z6841 Body Mass Index (BMI) 40.0 and over, adult: Secondary | ICD-10-CM | POA: Diagnosis not present

## 2015-05-08 MED ORDER — TECHNETIUM TC 99M SESTAMIBI - CARDIOLITE
34.7320 | Freq: Once | INTRAVENOUS | Status: AC | PRN
  Administered 2015-05-08: 34.732 via INTRAVENOUS

## 2015-05-08 MED ORDER — REGADENOSON 0.4 MG/5ML IV SOLN
0.4000 mg | Freq: Once | INTRAVENOUS | Status: AC
  Administered 2015-05-08: 0.4 mg via INTRAVENOUS

## 2015-05-08 MED ORDER — TECHNETIUM TC 99M SESTAMIBI - CARDIOLITE
13.4000 | Freq: Once | INTRAVENOUS | Status: AC | PRN
  Administered 2015-05-08: 13.4 via INTRAVENOUS

## 2015-05-09 LAB — NM MYOCAR MULTI W/SPECT W/WALL MOTION / EF
CHL CUP NUCLEAR SRS: 21
CHL CUP RESTING HR STRESS: 64 {beats}/min
CHL CUP STRESS STAGE 5 GRADE: 0 %
CHL CUP STRESS STAGE 5 HR: 68 {beats}/min
CHL CUP STRESS STAGE 5 SBP: 159 mmHg
CHL CUP STRESS STAGE 5 SPEED: 0 mph
CSEPHR: 46 %
CSEPPMHR: 44 %
Estimated workload: 1 METS
LV sys vol: 60 mL
LVDIAVOL: 120 mL
NUC STRESS TID: 0.87
Peak HR: 65 {beats}/min
SDS: 5
SSS: 22
Stage 1 HR: 64 {beats}/min
Stage 2 DBP: 66 mmHg
Stage 2 Grade: 0 %
Stage 2 HR: 63 {beats}/min
Stage 2 SBP: 144 mmHg
Stage 2 Speed: 0 mph
Stage 3 Grade: 0 %
Stage 3 HR: 63 {beats}/min
Stage 3 Speed: 0 mph
Stage 4 Grade: 0 %
Stage 4 HR: 65 {beats}/min
Stage 4 Speed: 0 mph
Stage 5 DBP: 73 mmHg

## 2015-05-11 ENCOUNTER — Encounter: Payer: Self-pay | Admitting: Emergency Medicine

## 2015-05-11 ENCOUNTER — Inpatient Hospital Stay
Admission: EM | Admit: 2015-05-11 | Discharge: 2015-05-12 | DRG: 202 | Disposition: A | Discharge status: Home / Self Care | Payer: Commercial Managed Care - HMO | Attending: Specialist | Admitting: Specialist

## 2015-05-11 ENCOUNTER — Emergency Department: Payer: Commercial Managed Care - HMO

## 2015-05-11 DIAGNOSIS — J9601 Acute respiratory failure with hypoxia: Secondary | ICD-10-CM | POA: Diagnosis present

## 2015-05-11 DIAGNOSIS — E669 Obesity, unspecified: Secondary | ICD-10-CM | POA: Diagnosis present

## 2015-05-11 DIAGNOSIS — Z981 Arthrodesis status: Secondary | ICD-10-CM

## 2015-05-11 DIAGNOSIS — J111 Influenza due to unidentified influenza virus with other respiratory manifestations: Secondary | ICD-10-CM | POA: Diagnosis not present

## 2015-05-11 DIAGNOSIS — K219 Gastro-esophageal reflux disease without esophagitis: Secondary | ICD-10-CM | POA: Diagnosis present

## 2015-05-11 DIAGNOSIS — F419 Anxiety disorder, unspecified: Secondary | ICD-10-CM | POA: Diagnosis present

## 2015-05-11 DIAGNOSIS — M1990 Unspecified osteoarthritis, unspecified site: Secondary | ICD-10-CM | POA: Diagnosis present

## 2015-05-11 DIAGNOSIS — E7800 Pure hypercholesterolemia, unspecified: Secondary | ICD-10-CM | POA: Diagnosis present

## 2015-05-11 DIAGNOSIS — J209 Acute bronchitis, unspecified: Secondary | ICD-10-CM | POA: Diagnosis present

## 2015-05-11 DIAGNOSIS — F329 Major depressive disorder, single episode, unspecified: Secondary | ICD-10-CM | POA: Diagnosis present

## 2015-05-11 DIAGNOSIS — R6510 Systemic inflammatory response syndrome (SIRS) of non-infectious origin without acute organ dysfunction: Secondary | ICD-10-CM

## 2015-05-11 DIAGNOSIS — R69 Illness, unspecified: Secondary | ICD-10-CM

## 2015-05-11 DIAGNOSIS — R001 Bradycardia, unspecified: Secondary | ICD-10-CM | POA: Diagnosis present

## 2015-05-11 DIAGNOSIS — R05 Cough: Secondary | ICD-10-CM | POA: Diagnosis not present

## 2015-05-11 DIAGNOSIS — Z7982 Long term (current) use of aspirin: Secondary | ICD-10-CM

## 2015-05-11 DIAGNOSIS — E785 Hyperlipidemia, unspecified: Secondary | ICD-10-CM | POA: Diagnosis present

## 2015-05-11 DIAGNOSIS — I959 Hypotension, unspecified: Secondary | ICD-10-CM | POA: Diagnosis present

## 2015-05-11 DIAGNOSIS — Z6841 Body Mass Index (BMI) 40.0 and over, adult: Secondary | ICD-10-CM

## 2015-05-11 DIAGNOSIS — I10 Essential (primary) hypertension: Secondary | ICD-10-CM | POA: Diagnosis present

## 2015-05-11 DIAGNOSIS — R6511 Systemic inflammatory response syndrome (SIRS) of non-infectious origin with acute organ dysfunction: Secondary | ICD-10-CM | POA: Diagnosis not present

## 2015-05-11 DIAGNOSIS — Z885 Allergy status to narcotic agent status: Secondary | ICD-10-CM

## 2015-05-11 DIAGNOSIS — Z79899 Other long term (current) drug therapy: Secondary | ICD-10-CM

## 2015-05-11 DIAGNOSIS — C9110 Chronic lymphocytic leukemia of B-cell type not having achieved remission: Secondary | ICD-10-CM | POA: Diagnosis present

## 2015-05-11 DIAGNOSIS — R0602 Shortness of breath: Secondary | ICD-10-CM | POA: Diagnosis not present

## 2015-05-11 LAB — COMPREHENSIVE METABOLIC PANEL
ALBUMIN: 3.4 g/dL — AB (ref 3.5–5.0)
ALK PHOS: 92 U/L (ref 38–126)
ALT: 22 U/L (ref 14–54)
AST: 23 U/L (ref 15–41)
Anion gap: 6 (ref 5–15)
BILIRUBIN TOTAL: 0.2 mg/dL — AB (ref 0.3–1.2)
BUN: 15 mg/dL (ref 6–20)
CALCIUM: 8.5 mg/dL — AB (ref 8.9–10.3)
CO2: 30 mmol/L (ref 22–32)
CREATININE: 1.07 mg/dL — AB (ref 0.44–1.00)
Chloride: 105 mmol/L (ref 101–111)
GFR calc Af Amer: 58 mL/min — ABNORMAL LOW (ref >60)
GFR, EST NON AFRICAN AMERICAN: 50 mL/min — AB (ref >60)
GLUCOSE: 97 mg/dL (ref 65–99)
POTASSIUM: 4 mmol/L (ref 3.5–5.1)
Sodium: 141 mmol/L (ref 135–145)
TOTAL PROTEIN: 6.4 g/dL — AB (ref 6.5–8.1)

## 2015-05-11 LAB — CBC WITH DIFFERENTIAL/PLATELET
BASOS ABS: 0.1 10*3/uL (ref 0–0.1)
BASOS PCT: 1 %
Eosinophils Absolute: 0.3 10*3/uL (ref 0–0.7)
Eosinophils Relative: 3 %
HEMATOCRIT: 38.8 % (ref 35.0–47.0)
HEMOGLOBIN: 12.5 g/dL (ref 12.0–16.0)
LYMPHS ABS: 7 10*3/uL — AB (ref 1.0–3.6)
LYMPHS PCT: 63 %
MCH: 30.1 pg (ref 26.0–34.0)
MCHC: 32.2 g/dL (ref 32.0–36.0)
MCV: 93.4 fL (ref 80.0–100.0)
MONOS PCT: 9 %
Monocytes Absolute: 1 10*3/uL — ABNORMAL HIGH (ref 0.2–0.9)
NEUTROS PCT: 24 %
Neutro Abs: 2.7 10*3/uL (ref 1.4–6.5)
Platelets: 195 10*3/uL (ref 150–440)
RBC: 4.16 MIL/uL (ref 3.80–5.20)
RDW: 15.8 % — AB (ref 11.5–14.5)
WBC: 11.1 10*3/uL — AB (ref 3.6–11.0)

## 2015-05-11 LAB — LACTIC ACID, PLASMA: Lactic Acid, Venous: 0.9 mmol/L (ref 0.5–2.0)

## 2015-05-11 LAB — TROPONIN I

## 2015-05-11 LAB — RAPID INFLUENZA A&B ANTIGENS
Influenza A (ARMC): NEGATIVE
Influenza B (ARMC): NEGATIVE

## 2015-05-11 MED ORDER — SODIUM CHLORIDE 0.9 % IV SOLN: 250.0000 mL | INTRAVENOUS | Status: DC | PRN

## 2015-05-11 MED ORDER — ONDANSETRON HCL 4 MG PO TABS: 4.0000 mg | ORAL_TABLET | Freq: Four times a day (QID) | ORAL | Status: DC | PRN

## 2015-05-11 MED ORDER — DEXTROSE 5 % IV SOLN
1.0000 g | Freq: Once | INTRAVENOUS | Status: AC
  Administered 2015-05-11: 1 g via INTRAVENOUS
  Filled 2015-05-11: qty 10

## 2015-05-11 MED ORDER — DEXTROSE 5 % IV SOLN
500.0000 mg | INTRAVENOUS | Status: DC
  Filled 2015-05-11: qty 500

## 2015-05-11 MED ORDER — SODIUM CHLORIDE 0.9 % IV BOLUS (SEPSIS)
1000.0000 mL | INTRAVENOUS | Status: DC
  Administered 2015-05-11: 1000 mL via INTRAVENOUS

## 2015-05-11 MED ORDER — AZITHROMYCIN 250 MG PO TABS
500.0000 mg | ORAL_TABLET | Freq: Every day | ORAL | Status: DC
Start: 2015-05-12 — End: 2015-05-12
  Administered 2015-05-12: 500 mg via ORAL
  Filled 2015-05-11: qty 2

## 2015-05-11 MED ORDER — ADULT MULTIVITAMIN W/MINERALS CH
1.0000 | ORAL_TABLET | Freq: Every day | ORAL | Status: DC
  Administered 2015-05-12: 1 via ORAL
  Filled 2015-05-11: qty 1

## 2015-05-11 MED ORDER — IPRATROPIUM-ALBUTEROL 0.5-2.5 (3) MG/3ML IN SOLN
3.0000 mL | RESPIRATORY_TRACT | Status: DC
  Administered 2015-05-11 – 2015-05-12 (×4): 3 mL via RESPIRATORY_TRACT
  Filled 2015-05-11 (×4): qty 3

## 2015-05-11 MED ORDER — POLYETHYLENE GLYCOL 3350 17 G PO PACK: 17.0000 g | PACK | Freq: Every day | ORAL | Status: DC | PRN

## 2015-05-11 MED ORDER — ACETAMINOPHEN 650 MG RE SUPP: 650.0000 mg | Freq: Four times a day (QID) | RECTAL | Status: DC | PRN

## 2015-05-11 MED ORDER — ASPIRIN EC 81 MG PO TBEC
81.0000 mg | DELAYED_RELEASE_TABLET | Freq: Every day | ORAL | Status: DC
  Administered 2015-05-11 – 2015-05-12 (×2): 81 mg via ORAL
  Filled 2015-05-11 (×2): qty 1

## 2015-05-11 MED ORDER — GABAPENTIN 100 MG PO CAPS
100.0000 mg | ORAL_CAPSULE | Freq: Three times a day (TID) | ORAL | Status: DC
  Administered 2015-05-11 – 2015-05-12 (×2): 100 mg via ORAL
  Filled 2015-05-11 (×2): qty 1

## 2015-05-11 MED ORDER — DEXTROSE 5 % IV SOLN
500.0000 mg | Freq: Once | INTRAVENOUS | Status: AC
  Administered 2015-05-11: 500 mg via INTRAVENOUS
  Filled 2015-05-11: qty 500

## 2015-05-11 MED ORDER — PRAVASTATIN SODIUM 40 MG PO TABS
40.0000 mg | ORAL_TABLET | Freq: Every day | ORAL | Status: DC
  Administered 2015-05-12: 10:00:00 40 mg via ORAL
  Filled 2015-05-11: qty 1

## 2015-05-11 MED ORDER — SODIUM CHLORIDE 0.9 % IJ SOLN: 3.0000 mL | INTRAMUSCULAR | Status: DC | PRN

## 2015-05-11 MED ORDER — ESCITALOPRAM OXALATE 10 MG PO TABS
10.0000 mg | ORAL_TABLET | Freq: Every day | ORAL | Status: DC
  Administered 2015-05-12: 10:00:00 10 mg via ORAL
  Filled 2015-05-11: qty 1

## 2015-05-11 MED ORDER — ASPIRIN EC 325 MG PO TBEC: 650.0000 mg | DELAYED_RELEASE_TABLET | Freq: Every day | ORAL | Status: DC

## 2015-05-11 MED ORDER — METHYLPREDNISOLONE SODIUM SUCC 125 MG IJ SOLR
60.0000 mg | INTRAMUSCULAR | Status: DC
  Administered 2015-05-11: 60 mg via INTRAVENOUS
  Filled 2015-05-11 (×2): qty 2

## 2015-05-11 MED ORDER — ALBUTEROL SULFATE (2.5 MG/3ML) 0.083% IN NEBU
5.0000 mg | INHALATION_SOLUTION | Freq: Once | RESPIRATORY_TRACT | Status: AC
  Administered 2015-05-11: 5 mg via RESPIRATORY_TRACT
  Filled 2015-05-11: qty 6

## 2015-05-11 MED ORDER — HYDRALAZINE HCL 20 MG/ML IJ SOLN: 10.0000 mg | Freq: Four times a day (QID) | INTRAMUSCULAR | Status: DC | PRN

## 2015-05-11 MED ORDER — SODIUM CHLORIDE 0.9 % IJ SOLN
3.0000 mL | Freq: Two times a day (BID) | INTRAMUSCULAR | Status: DC
  Administered 2015-05-11: 3 mL via INTRAVENOUS

## 2015-05-11 MED ORDER — ONDANSETRON HCL 4 MG/2ML IJ SOLN: 4.0000 mg | Freq: Four times a day (QID) | INTRAMUSCULAR | Status: DC | PRN

## 2015-05-11 MED ORDER — ACETAMINOPHEN 325 MG PO TABS: 650.0000 mg | ORAL_TABLET | Freq: Four times a day (QID) | ORAL | Status: DC | PRN

## 2015-05-11 MED ORDER — METHYLPREDNISOLONE SODIUM SUCC 125 MG IJ SOLR
125.0000 mg | INTRAMUSCULAR | Status: AC
  Administered 2015-05-11: 125 mg via INTRAVENOUS
  Filled 2015-05-11: qty 2

## 2015-05-11 MED ORDER — ENOXAPARIN SODIUM 40 MG/0.4ML ~~LOC~~ SOLN
40.0000 mg | SUBCUTANEOUS | Status: DC
  Administered 2015-05-11: 22:00:00 40 mg via SUBCUTANEOUS
  Filled 2015-05-11 (×2): qty 0.4

## 2015-05-11 MED ORDER — DEXTROSE 5 % IV SOLN: 1.0000 g | INTRAVENOUS | Status: DC

## 2015-05-11 MED ORDER — BISACODYL 5 MG PO TBEC: 5.0000 mg | DELAYED_RELEASE_TABLET | Freq: Every day | ORAL | Status: DC | PRN

## 2015-05-11 MED ORDER — GUAIFENESIN-DM 100-10 MG/5ML PO SYRP: 5.0000 mL | ORAL_SOLUTION | ORAL | Status: DC | PRN

## 2015-05-11 NOTE — ED Notes (Signed)
Patient complaining of not being able to breathe.   Symptoms started 1/16 PM States she can't breathe laying down. Congested cough - non-productive. Body aches, "I don't feel like I've got a temperature"

## 2015-05-11 NOTE — Progress Notes (Signed)
ANTIBIOTIC CONSULT NOTE - INITIAL  Pharmacy Consult for ceftriaxone & azithromycin Indication: pneumonia (CAP)  Allergies  Allergen Reactions  . Oxycodone     Other reaction(s): Other (See Comments) Hallucinations  . Morphine     Other reaction(s): Hallucination  . Morphine And Related Other (See Comments)    Hallucinations  . Oxycodone Other (See Comments)    hallucinations   Patient Measurements: Height: 5\' 5"  (165.1 cm) Weight: 286 lb (129.729 kg) IBW/kg (Calculated) : 57   Vital Signs: Temp: 98 F (36.7 C) (01/19 1252) Temp Source: Oral (01/19 1252) BP: 81/53 mmHg (01/19 1252) Pulse Rate: 65 (01/19 1252)  Labs: No results for input(s): WBC, HGB, PLT, LABCREA, CREATININE in the last 72 hours.  Microbiology: No results found for this or any previous visit (from the past 720 hour(s)).  Assessment: Pharmacy consulted to dose ceftriaxone and azithromycin in this 75 year old female for CAP. Patient was ordered ceftriaxone 1 g and azithromycin 500 mg IV x 1 dose in the ED.  Plan:  Measure antibiotic drug levels at steady state Follow up culture results  Anticipated duration of therapy of 5 days Continue with ceftriaxone 1 g IV daily and azithromycin 500 mg IV daily Monitor for change to PO  Antibiotics when possible  Pharmacy will continue to monitor, thank you for the consult.  Lenis Noon, PharmD Clinical Pharmacist 05/11/2015,2:13 PM

## 2015-05-11 NOTE — ED Provider Notes (Signed)
CSN: [id number]     Arrival date & time 05/11/15  1236 History   First MD Initiated Contact with Patient 05/11/15 1349     Chief Complaint  Patient presents with  . Shortness of Breath     (Consider location/radiation/quality/duration/timing/severity/associated sxs/prior Treatment) The history is provided by the patient.  Jamie Phillips is a 75 y.o. female hx of reflux, CLL in remission, HL, here with cough, trouble breathing, subjective fevers. Patient states that she has been coughing and short of breath for the last 2-3 days. Has some subjective chills as well. Has pretty poor appetite but no vomiting. He called her doctor's office yesterday and the doctor called in Tamiflu for her. She picked up her Tamiflu this morning and took a dose but did not help. She has worsening shortness of breath and cough today. She was noted to be hypotensive, hypoxic in triage    Past Medical History  Diagnosis Date  . Hypertension   . GERD (gastroesophageal reflux disease)   . Hypercholesteremia   . Cancer ([place])     cll  . CLL (chronic lymphocytic leukemia) ([place])   . MGUS (monoclonal gammopathy of unknown significance)   . Hyperlipidemia   . Arthritis   . Hemorrhoids   . Depression   . Obesity   . Anxiety   . Back pain    Past Surgical History  Procedure Laterality Date  . Neck fusion    . Breast biopsy Left 08/19/04    lt bx/clip-neg  . Wrist surgery Left 2000   Family History  Problem Relation Age of Onset  . Heart failure Father   . Pulmonary embolism Mother   . Congestive Heart Failure Father    Social History  Substance Use Topics  . Smoking status: Never Smoker   . Smokeless tobacco: Never Used  . Alcohol Use: No   OB History    Gravida Para Term Preterm AB TAB SAB Ectopic Multiple Living   0 0 0 0 0 0 0 0       Review of Systems  Constitutional: Positive for chills.  Respiratory: Positive for cough, shortness of breath and wheezing.   All other systems reviewed and  are negative.     Allergies  Oxycodone; Morphine; Morphine and related; and Oxycodone  Home Medications   Prior to Admission medications   Medication Sig Start Date End Date Taking? Authorizing Provider  aspirin 325 MG EC tablet Take 650 mg by mouth daily.    Historical Provider, MD  escitalopram (LEXAPRO) 10 MG tablet Take 10 mg by mouth daily.    Historical Provider, MD  gabapentin (NEURONTIN) 100 MG capsule Take 100 mg by mouth 3 (three) times daily.    Historical Provider, MD  losartan-hydrochlorothiazide (HYZAAR) 100-12.5 MG tablet Take 1 tablet by mouth daily.    Historical Provider, MD  Multiple Vitamin (MULTI-VITAMINS) TABS Take 1 tablet by mouth daily.     Historical Provider, MD  Multiple Vitamins-Minerals (PRESERVISION AREDS 2 PO) Take by mouth 2 (two) times daily.    Historical Provider, MD  pravastatin (PRAVACHOL) 40 MG tablet TAKE 1 TABLET BY MOUTH ONCE A DAY 07/01/14   Historical Provider, MD  pravastatin (PRAVACHOL) 40 MG tablet Take 40 mg by mouth daily.    Historical Provider, MD  propranolol (INDERAL) 40 MG tablet Take 40 mg by mouth 2 (two) times daily.  11/24/14   Historical Provider, MD  VOLTAREN 1 % GEL Apply 2 g topically 4 (four) times daily.  12/14/14   Historical Provider, MD   BP 118/59 mmHg  Pulse 51  Temp(Src) 98 F (36.7 C) (Oral)  Resp 17  Ht 5\' 5"  (1.651 m)  Wt 285 lb 4.4 oz (129.4 kg)  BMI 47.47 kg/m2  SpO2 96% Physical Exam  Constitutional: She is oriented to person, place, and time.  Coughing   HENT:  Head: Normocephalic.  Eyes: Conjunctivae are normal. Pupils are equal, round, and reactive to light.  Neck: Normal range of motion. Neck supple.  Cardiovascular: Normal rate, regular rhythm and normal heart sounds.   Pulmonary/Chest:  Slightly tachypneic, mild diffuse wheezing, no retractions   Abdominal: Soft. Bowel sounds are normal. She exhibits no distension. There is no tenderness. There is no rebound.  Musculoskeletal: Normal range of  motion. She exhibits no edema or tenderness.  Neurological: She is alert and oriented to person, place, and time.  Skin: Skin is warm and dry.  Psychiatric: She has a normal mood and affect. Her behavior is normal. Judgment and thought content normal.  Nursing note and vitals reviewed.   ED Course  Procedures (including critical care time) Labs Review Labs Reviewed  CBC WITH DIFFERENTIAL/PLATELET - Abnormal; Notable for the following:    WBC 11.1 (*)    RDW 15.8 (*)    All other components within normal limits  CULTURE, BLOOD (ROUTINE X 2)  CULTURE, BLOOD (ROUTINE X 2)  COMPREHENSIVE METABOLIC PANEL  TROPONIN I  LACTIC ACID, PLASMA  INFLUENZA PANEL BY PCR (TYPE A & B, H1N1)    Imaging Review Dg Chest 2 View  05/11/2015  CLINICAL DATA:  Shortness of breath for 3 days with coughing congestion. EXAM: CHEST  2 VIEW COMPARISON:  02/27/2015 FINDINGS: The cardiac silhouette remains mildly enlarged. There is minimal atelectasis in the right lung base. No confluent airspace opacity, edema, pleural effusion, or pneumothorax is identified. Prior cervical spine fusion and thoracic spondylosis are noted. IMPRESSION: No active cardiopulmonary disease. Electronically Signed   By: Logan Bores M.D.   On: 05/11/2015 13:46   I have personally reviewed and evaluated these images and lab results as part of my medical decision-making.   EKG Interpretation None      MDM   Final diagnoses:  None   Jamie Phillips is a 75 y.o. female here with cough, wheezing, hypoxia, hypotension. I am concerned for pneumonia vs flu. Code sepsis initiated. Will give 30 cc/kg bolus, will start empiric abx. Will check labs, lactate, CXR. Will reassess. Likely need admission for hypoxia, hypotension.   3:16 PM CXR clear. BP improved in the room. Ox 96% on 2L. Flu ordered. Signed out to Dr. Joni Fears. Anticipate admission unless patient improves drastically.    Wandra Arthurs, MD 05/11/15 [phone]

## 2015-05-11 NOTE — ED Notes (Signed)
Patient started on Oseltamivir this AM.

## 2015-05-11 NOTE — ED Provider Notes (Signed)
Assumed care from Dr. Darl Householder at 3:00 PM. Workup essentially negative. Blood pressure remained stable after IV fluids, the patient also remains hypoxic on room air. We'll discuss with hospitalist for admission. Flu test negative but her symptoms do appear to be an influenza-like illness, with which she is having a particularly severe course and appears to warrant in-hospital treatment.  Carrie Mew, MD 05/11/15 [phone]

## 2015-05-11 NOTE — ED Notes (Signed)
Pt ambulated in room independently without oxygen. O2 sat 89% RA upon rest. Pt placed back on 2L oxygen. O2 sat returned to 94%.

## 2015-05-11 NOTE — H&P (Signed)
[facility] [place] NAME: Tierza Stores    MR#:  [id number]  DATE OF BIRTH:  [birth year]  DATE OF ADMISSION:  05/11/2015  PRIMARY CARE PHYSICIAN: Kirk Ruths., MD   REQUESTING/REFERRING PHYSICIAN: Dr. Joni Fears  CHIEF COMPLAINT:   Chief Complaint  Patient presents with  . Shortness of Breath    HISTORY OF PRESENT ILLNESS:  Jamie Phillips  is a 75 y.o. female with a known history of CLL, hypertension, obesity presents to the emergency room complaining of 2 days of shortness of breath, cough and wheezing. Her blood pressure initially on presentation to the emergency room was systolic of [id number] which improved up to systolic of [id number] with IV fluids. Influenza was negative. Chest x-ray showed no pneumonia. She did receive multiple nebulizer therapy in the emergency room. Saturations initially were 86% on room air which improved briefly but patient continues to be hypoxic on room air and is being admitted to the hospitalist service. No recent antibiotic use. No sick contacts. No orthopnea or edema. She does have clear sputum.Marland Kitchen  PAST MEDICAL HISTORY:   Past Medical History  Diagnosis Date  . Hypertension   . GERD (gastroesophageal reflux disease)   . Hypercholesteremia   . Cancer ([place])     cll  . CLL (chronic lymphocytic leukemia) ([place])   . MGUS (monoclonal gammopathy of unknown significance)   . Hyperlipidemia   . Arthritis   . Hemorrhoids   . Depression   . Obesity   . Anxiety   . Back pain     PAST SURGICAL HISTORY:   Past Surgical History  Procedure Laterality Date  . Neck fusion    . Breast biopsy Left 08/19/04    lt bx/clip-neg  . Wrist surgery Left 2000    SOCIAL HISTORY:   Social History  Substance Use Topics  . Smoking status: Never Smoker   . Smokeless tobacco: Never Used  . Alcohol Use: No    FAMILY HISTORY:   Family History  Problem Relation Age of Onset  . Heart failure Father   . Pulmonary  embolism Mother   . Congestive Heart Failure Father     DRUG ALLERGIES:   Allergies  Allergen Reactions  . Oxycodone     Other reaction(s): Other (See Comments) Hallucinations  . Morphine     Other reaction(s): Hallucination    REVIEW OF SYSTEMS:   Review of Systems  Constitutional: Positive for chills and malaise/fatigue. Negative for fever and weight loss.  HENT: Negative for hearing loss and nosebleeds.   Eyes: Negative for blurred vision, double vision and pain.  Respiratory: Positive for cough and shortness of breath. Negative for hemoptysis, sputum production and wheezing.   Cardiovascular: Negative for chest pain, palpitations, orthopnea and leg swelling.  Gastrointestinal: Negative for nausea, vomiting, abdominal pain, diarrhea and constipation.  Genitourinary: Negative for dysuria and hematuria.  Musculoskeletal: Positive for myalgias. Negative for back pain and falls.  Skin: Negative for rash.  Neurological: Positive for weakness. Negative for dizziness, tremors, sensory change, speech change, focal weakness, seizures and headaches.  Endo/Heme/Allergies: Does not bruise/bleed easily.  Psychiatric/Behavioral: Negative for depression and memory loss. The patient is not nervous/anxious.     MEDICATIONS AT HOME:   Prior to Admission medications   Medication Sig Start Date End Date Taking? Authorizing Provider  aspirin 325 MG EC tablet Take 650 mg by mouth daily.   Yes Historical Provider, MD  escitalopram (LEXAPRO) 10 MG tablet Take 10 mg  by mouth daily.   Yes Historical Provider, MD  gabapentin (NEURONTIN) 100 MG capsule Take 100 mg by mouth 3 (three) times daily.   Yes Historical Provider, MD  losartan-hydrochlorothiazide (HYZAAR) 100-12.5 MG tablet Take 1 tablet by mouth daily.   Yes Historical Provider, MD  Multiple Vitamin (MULTI-VITAMINS) TABS Take 1 tablet by mouth daily.    Yes Historical Provider, MD  Multiple Vitamins-Minerals (PRESERVISION AREDS 2 PO) Take by  mouth 2 (two) times daily.   Yes Historical Provider, MD  oseltamivir (TAMIFLU) 75 MG capsule Take 75 mg by mouth 2 (two) times daily.   Yes Historical Provider, MD  pravastatin (PRAVACHOL) 40 MG tablet TAKE 1 TABLET BY MOUTH ONCE A DAY 07/01/14  Yes Historical Provider, MD  propranolol (INDERAL) 40 MG tablet Take 40 mg by mouth 2 (two) times daily.  11/24/14  Yes Historical Provider, MD      VITAL SIGNS:  Blood pressure 154/84, pulse 54, temperature 98 F (36.7 C), temperature source Oral, resp. rate 20, height 5\' 5"  (1.651 m), weight 129.4 kg (285 lb 4.4 oz), SpO2 95 %.  PHYSICAL EXAMINATION:  Physical Exam  GENERAL:  75 y.o.-year-old patient lying in the bed with no acute distress. Morbidly obese. Conversational dyspnea EYES: Pupils equal, round, reactive to light and accommodation. No scleral icterus. Extraocular muscles intact.  HEENT: Head atraumatic, normocephalic. Oropharynx and nasopharynx clear. No oropharyngeal erythema, moist oral mucosa  NECK:  Supple, no jugular venous distention. No thyroid enlargement, no tenderness.  LUNGS: Increased work of breathing. Bilateral wheezing with poor air entry CARDIOVASCULAR: S1, S2 normal. No murmurs, rubs, or gallops.  ABDOMEN: Soft, nontender, nondistended. Bowel sounds present. No organomegaly or mass.  EXTREMITIES: No pedal edema, cyanosis, or clubbing. + 2 pedal & radial pulses b/l.   NEUROLOGIC: Cranial nerves II through XII are intact. No focal Motor or sensory deficits appreciated b/l PSYCHIATRIC: The patient is alert and oriented x 3. Good affect.  SKIN: No obvious rash, lesion, or ulcer.   LABORATORY PANEL:   CBC  Recent Labs Lab 05/11/15 1417  WBC 11.1*  HGB 12.5  HCT 38.8  PLT 195   ------------------------------------------------------------------------------------------------------------------  Chemistries   Recent Labs Lab 05/11/15 1417  NA 141  K 4.0  CL 105  CO2 30  GLUCOSE 97  BUN 15  CREATININE 1.07*   CALCIUM 8.5*  AST 23  ALT 22  ALKPHOS 92  BILITOT 0.2*   ------------------------------------------------------------------------------------------------------------------  Cardiac Enzymes  Recent Labs Lab 05/11/15 1417  TROPONINI <0.03   ------------------------------------------------------------------------------------------------------------------  RADIOLOGY:  Dg Chest 2 View  05/11/2015  CLINICAL DATA:  Shortness of breath for 3 days with coughing congestion. EXAM: CHEST  2 VIEW COMPARISON:  02/27/2015 FINDINGS: The cardiac silhouette remains mildly enlarged. There is minimal atelectasis in the right lung base. No confluent airspace opacity, edema, pleural effusion, or pneumothorax is identified. Prior cervical spine fusion and thoracic spondylosis are noted. IMPRESSION: No active cardiopulmonary disease. Electronically Signed   By: Logan Bores M.D.   On: 05/11/2015 13:46     IMPRESSION AND PLAN:   * Acute bronchitis with acute hypoxic respiratory failure -IV steroids, Antibiotics - Scheduled Nebulizers - Inhalers -Wean O2 as tolerated - Consult pulmonary if no improvement  * HTN patient was hypotensive on presentation. Also has mild bradycardia. Hold her propranolol and other blood pressure medications at this time.  * CLL Stable. Follows with oncology as outpatient.  * DVT prophylaxis with Lovenox   All the records are reviewed and case  discussed with ED provider. Management plans discussed with the patient, family and they are in agreement.  CODE STATUS: FULL  TOTAL TIME TAKING CARE OF THIS PATIENT: 40 minutes.    Hillary Bow R M.D on 05/11/2015 at 6:56 PM  Between 7am to 6pm - Pager - [phone]  After 6pm go to [email or web address] - password [email or web address] [facility]  Office  [phone]  CC: Primary care physician; Kirk Ruths., MD   Note: This dictation was prepared with Dragon dictation along with smaller phrase  technology. Any transcriptional errors that result from this process are unintentional.

## 2015-05-12 MED ORDER — IPRATROPIUM-ALBUTEROL 0.5-2.5 (3) MG/3ML IN SOLN: 3.0000 mL | Freq: Two times a day (BID) | RESPIRATORY_TRACT | Status: DC

## 2015-05-12 MED ORDER — PREDNISONE 10 MG PO TABS: ORAL_TABLET | ORAL | Status: DC

## 2015-05-12 MED ORDER — AZITHROMYCIN 250 MG PO TABS: 250.0000 mg | ORAL_TABLET | Freq: Every day | ORAL | Status: AC

## 2015-05-12 NOTE — Plan of Care (Signed)
Problem: Education: Goal: Knowledge of [facility] Education information/materials will improve Outcome: Progressing Education provided this shift about care plan, nebulizer treatments and medications given.    Problem: Safety: Goal: Ability to remain free from injury will improve Outcome: Progressing Patient with strong dorsi and plantar flexion and steady on feet.  She is stand-by assist because her O2 desats quickly with exertion and she becomes lightheaded.  Patient compliant with calling for assistance.

## 2015-05-12 NOTE — Discharge Summary (Signed)
[facility] [place] NAME: Jamie Phillips    MR#:  [id number]  DATE OF BIRTH:  1941/02/09  DATE OF ADMISSION:  05/11/2015 ADMITTING PHYSICIAN: Hillary Bow, MD  DATE OF DISCHARGE: 05/12/2015  PRIMARY CARE PHYSICIAN: Kirk Ruths., MD    ADMISSION DIAGNOSIS:  SIRS (systemic inflammatory response syndrome) (HCC) [R65.10] Acute respiratory failure with hypoxia (HCC) [J96.01] Influenza-like illness [J11.1]  DISCHARGE DIAGNOSIS:  Active Problems:   Acute bronchitis   SECONDARY DIAGNOSIS:   Past Medical History  Diagnosis Date  . Hypertension   . GERD (gastroesophageal reflux disease)   . Hypercholesteremia   . Cancer ([place])     cll  . CLL (chronic lymphocytic leukemia) ([place])   . MGUS (monoclonal gammopathy of unknown significance)   . Hyperlipidemia   . Arthritis   . Hemorrhoids   . Depression   . Obesity   . Anxiety   . Back pain     HOSPITAL COURSE:   75 year old female with past medical history of hypertension, GERD, hyperlipidemia, history of CLL, depression, obesity who presented to the hospital shortness of breath cough and wheezing and noted to have acute bronchitis.  #1 acute bronchitis-this is the cause of patient's acute hypoxic respiratory failure. -Patient was treated with IV steroids, IV ceftriaxone/Zithromax. Her chest x-ray did not show any evidence of acute pneumonia. -She was also given nebulizers and placed on oxygen supplementation. After aggressive therapy patient's clinical symptoms have improved and she is now being discharged on oral prednisone taper and oral Zithromax. She was ambulating on room air and did not desaturate below 92%.  #2 acute respiratory failure with hypoxia-due to #1. -After aggressive therapy patient is clinically improved. She was no longer hypoxic upon ambulation therefore being discharged home.  #3 hypertension-patient will resume her propranolol, losartan/HCTZ.  #4  history of CLL-patient will continue follow-up with oncology as an outpatient.  #5 depression-she will continue her Lexapro.  #6 hyperlipidemia-she will continue her Pravachol.  DISCHARGE CONDITIONS:   Stable  CONSULTS OBTAINED:     DRUG ALLERGIES:   Allergies  Allergen Reactions  . Oxycodone     Other reaction(s): Other (See Comments) Hallucinations  . Morphine     Other reaction(s): Hallucination    DISCHARGE MEDICATIONS:   Current Discharge Medication List    START taking these medications   Details  azithromycin (ZITHROMAX) 250 MG tablet Take 1 tablet (250 mg total) by mouth daily. Qty: 5 each, Refills: 0    predniSONE (DELTASONE) 10 MG tablet Label  & dispense according to the schedule below. 5 Pills PO for 1 day then, 4 Pills PO for 1 day, 3 Pills PO for 1 day, 2 Pills PO for 1 day, 1 Pill PO for 1 days then STOP. Qty: 15 tablet, Refills: 0      CONTINUE these medications which have NOT CHANGED   Details  aspirin 325 MG EC tablet Take 650 mg by mouth daily.    escitalopram (LEXAPRO) 10 MG tablet Take 10 mg by mouth daily.    gabapentin (NEURONTIN) 100 MG capsule Take 100 mg by mouth 3 (three) times daily.   Associated Diagnoses: Chest pain, unspecified chest pain type; SOB (shortness of breath)    losartan-hydrochlorothiazide (HYZAAR) 100-12.5 MG tablet Take 1 tablet by mouth daily.    Multiple Vitamin (MULTI-VITAMINS) TABS Take 1 tablet by mouth daily.    Associated Diagnoses: CLL (chronic lymphocytic leukemia) (HCC)    Multiple Vitamins-Minerals (PRESERVISION AREDS 2 PO)  Take by mouth 2 (two) times daily.   Associated Diagnoses: Chest pain, unspecified chest pain type; SOB (shortness of breath)    pravastatin (PRAVACHOL) 40 MG tablet TAKE 1 TABLET BY MOUTH ONCE A DAY   Associated Diagnoses: CLL (chronic lymphocytic leukemia) (HCC)    propranolol (INDERAL) 40 MG tablet Take 40 mg by mouth 2 (two) times daily.    Associated Diagnoses: CLL (chronic  lymphocytic leukemia) (HCC)      STOP taking these medications     oseltamivir (TAMIFLU) 75 MG capsule          DISCHARGE INSTRUCTIONS:   DIET:  Cardiac diet  DISCHARGE CONDITION:  Stable  ACTIVITY:  Activity as tolerated  OXYGEN:  Home Oxygen: No.   Oxygen Delivery: room air  DISCHARGE LOCATION:  home   If you experience worsening of your admission symptoms, develop shortness of breath, life threatening emergency, suicidal or homicidal thoughts you must seek medical attention immediately by calling 911 or calling your MD immediately  if symptoms less severe.  You Must read complete instructions/literature along with all the possible adverse reactions/side effects for all the Medicines you take and that have been prescribed to you. Take any new Medicines after you have completely understood and accpet all the possible adverse reactions/side effects.   Please note  You were cared for by a hospitalist during your hospital stay. If you have any questions about your discharge medications or the care you received while you were in the hospital after you are discharged, you can call the unit and asked to speak with the hospitalist on call if the hospitalist that took care of you is not available. Once you are discharged, your primary care physician will handle any further medical issues. Please note that NO REFILLS for any discharge medications will be authorized once you are discharged, as it is imperative that you return to your primary care physician (or establish a relationship with a primary care physician if you do not have one) for your aftercare needs so that they can reassess your need for medications and monitor your lab values.     Today   Shortness of breath, wheezing much improved. Feels much better.  VITAL SIGNS:  Blood pressure 151/74, pulse 61, temperature 98.2 F (36.8 C), temperature source Oral, resp. rate 18, height 5\' 5"  (1.651 m), weight 130.001 kg (286  lb 9.6 oz), SpO2 92 %.  I/O:   Intake/Output Summary (Last 24 hours) at 05/12/15 1545 Last data filed at 05/12/15 1200  Gross per 24 hour  Intake    480 ml  Output      0 ml  Net    480 ml    PHYSICAL EXAMINATION:  GENERAL:  75 y.o.-year-old patient lying in the bed in no acute distress.  EYES: Pupils equal, round, reactive to light and accommodation. No scleral icterus. Extraocular muscles intact.  HEENT: Head atraumatic, normocephalic. Oropharynx and nasopharynx clear.  NECK:  Supple, no jugular venous distention. No thyroid enlargement, no tenderness.  LUNGS: Normal breath sounds bilaterally, minimal end-exp. wheezing, No rales, rhonchi. No use of accessory muscles of respiration.  CARDIOVASCULAR: S1, S2 normal. No murmurs, rubs, or gallops.  ABDOMEN: Soft, non-tender, non-distended. Bowel sounds present. No organomegaly or mass.  EXTREMITIES: No pedal edema, cyanosis, or clubbing.  NEUROLOGIC: Cranial nerves II through XII are intact. No focal motor or sensory defecits b/l.  PSYCHIATRIC: The patient is alert and oriented x 3. Good affect.  SKIN: No obvious rash, lesion, or  ulcer.   DATA REVIEW:   CBC  Recent Labs Lab 05/11/15 1417  WBC 11.1*  HGB 12.5  HCT 38.8  PLT 195    Chemistries   Recent Labs Lab 05/11/15 1417  NA 141  K 4.0  CL 105  CO2 30  GLUCOSE 97  BUN 15  CREATININE 1.07*  CALCIUM 8.5*  AST 23  ALT 22  ALKPHOS 92  BILITOT 0.2*    Cardiac Enzymes  Recent Labs Lab 05/11/15 1417  TROPONINI <0.03    Microbiology Results  Results for orders placed or performed during the hospital encounter of 05/11/15  Blood Culture (routine x 2)     Status: None (Preliminary result)   Collection Time: 05/11/15  2:18 PM  Result Value Ref Range Status   Specimen Description BLOOD RIGHT HAND  Final   Special Requests BOTTLES DRAWN AEROBIC AND ANAEROBIC 4CC  Final   Culture NO GROWTH < 24 HOURS  Final   Report Status PENDING  Incomplete  Rapid  Influenza A&B Antigens ([facility] only)     Status: None   Collection Time: 05/11/15  2:19 PM  Result Value Ref Range Status   Influenza A ([place]) NEGATIVE  Final   Influenza B ([facility]) NEGATIVE  Final  Blood Culture (routine x 2)     Status: None (Preliminary result)   Collection Time: 05/11/15  2:23 PM  Result Value Ref Range Status   Specimen Description BLOOD LEFT FATTY CASTS  Final   Special Requests BOTTLES DRAWN AEROBIC AND ANAEROBIC 1CC  Final   Culture NO GROWTH < 24 HOURS  Final   Report Status PENDING  Incomplete    RADIOLOGY:  Dg Chest 2 View  05/11/2015  CLINICAL DATA:  Shortness of breath for 3 days with coughing congestion. EXAM: CHEST  2 VIEW COMPARISON:  02/27/2015 FINDINGS: The cardiac silhouette remains mildly enlarged. There is minimal atelectasis in the right lung base. No confluent airspace opacity, edema, pleural effusion, or pneumothorax is identified. Prior cervical spine fusion and thoracic spondylosis are noted. IMPRESSION: No active cardiopulmonary disease. Electronically Signed   By: Logan Bores M.D.   On: 05/11/2015 13:46      Management plans discussed with the patient, family and they are in agreement.  CODE STATUS:     Code Status Orders        Start     Ordered   05/11/15 1842  Full code   Continuous     05/11/15 1844    Code Status History    Date Active Date Inactive Code Status Order ID Comments User Context   This patient has a current code status but no historical code status.      TOTAL TIME TAKING CARE OF THIS PATIENT: 40 minutes.    Henreitta Leber M.D on 05/12/2015 at 3:45 PM  Between 7am to 6pm - Pager - [phone]  After 6pm go to [email or web address] - password [email or web address] [facility]  Office  [phone]  CC: Primary care physician; Kirk Ruths., MD

## 2015-05-12 NOTE — Progress Notes (Signed)
Pt on room air. No distress. Vss. Eager to go home. Dc instructions, meds and follow up given. Dc home

## 2015-05-16 LAB — CULTURE, BLOOD (ROUTINE X 2)
Culture: NO GROWTH
Culture: NO GROWTH

## 2015-05-22 DIAGNOSIS — E7800 Pure hypercholesterolemia, unspecified: Secondary | ICD-10-CM | POA: Diagnosis not present

## 2015-05-22 DIAGNOSIS — R739 Hyperglycemia, unspecified: Secondary | ICD-10-CM | POA: Diagnosis not present

## 2015-05-22 DIAGNOSIS — C9110 Chronic lymphocytic leukemia of B-cell type not having achieved remission: Secondary | ICD-10-CM | POA: Diagnosis not present

## 2015-05-22 DIAGNOSIS — I129 Hypertensive chronic kidney disease with stage 1 through stage 4 chronic kidney disease, or unspecified chronic kidney disease: Secondary | ICD-10-CM | POA: Diagnosis not present

## 2015-05-22 DIAGNOSIS — N183 Chronic kidney disease, stage 3 (moderate): Secondary | ICD-10-CM | POA: Diagnosis not present

## 2015-05-22 DIAGNOSIS — J45991 Cough variant asthma: Secondary | ICD-10-CM | POA: Diagnosis not present

## 2015-05-22 DIAGNOSIS — R05 Cough: Secondary | ICD-10-CM | POA: Diagnosis not present

## 2015-05-22 DIAGNOSIS — F325 Major depressive disorder, single episode, in full remission: Secondary | ICD-10-CM | POA: Diagnosis not present

## 2015-05-22 DIAGNOSIS — Z6841 Body Mass Index (BMI) 40.0 and over, adult: Secondary | ICD-10-CM | POA: Diagnosis not present

## 2015-06-28 DIAGNOSIS — M7052 Other bursitis of knee, left knee: Secondary | ICD-10-CM | POA: Diagnosis not present

## 2015-07-25 ENCOUNTER — Other Ambulatory Visit: Payer: Self-pay | Admitting: Orthopedic Surgery

## 2015-07-25 DIAGNOSIS — M898X6 Other specified disorders of bone, lower leg: Secondary | ICD-10-CM | POA: Diagnosis not present

## 2015-07-27 ENCOUNTER — Inpatient Hospital Stay: Payer: Commercial Managed Care - HMO

## 2015-07-27 ENCOUNTER — Ambulatory Visit: Payer: Commercial Managed Care - HMO

## 2015-08-01 ENCOUNTER — Inpatient Hospital Stay: Payer: Commercial Managed Care - HMO | Attending: Internal Medicine

## 2015-08-01 ENCOUNTER — Encounter: Payer: Self-pay | Admitting: *Deleted

## 2015-08-01 ENCOUNTER — Inpatient Hospital Stay (HOSPITAL_BASED_OUTPATIENT_CLINIC_OR_DEPARTMENT_OTHER): Payer: Commercial Managed Care - HMO | Admitting: Internal Medicine

## 2015-08-01 ENCOUNTER — Other Ambulatory Visit: Payer: Self-pay | Admitting: *Deleted

## 2015-08-01 VITALS — BP 191/79 | HR 57 | Temp 95.9°F | Resp 18 | Wt 282.9 lb

## 2015-08-01 DIAGNOSIS — M129 Arthropathy, unspecified: Secondary | ICD-10-CM | POA: Diagnosis not present

## 2015-08-01 DIAGNOSIS — E669 Obesity, unspecified: Secondary | ICD-10-CM

## 2015-08-01 DIAGNOSIS — I898 Other specified noninfective disorders of lymphatic vessels and lymph nodes: Secondary | ICD-10-CM | POA: Insufficient documentation

## 2015-08-01 DIAGNOSIS — N189 Chronic kidney disease, unspecified: Secondary | ICD-10-CM | POA: Diagnosis not present

## 2015-08-01 DIAGNOSIS — D472 Monoclonal gammopathy: Secondary | ICD-10-CM

## 2015-08-01 DIAGNOSIS — K219 Gastro-esophageal reflux disease without esophagitis: Secondary | ICD-10-CM | POA: Insufficient documentation

## 2015-08-01 DIAGNOSIS — F418 Other specified anxiety disorders: Secondary | ICD-10-CM | POA: Insufficient documentation

## 2015-08-01 DIAGNOSIS — M25561 Pain in right knee: Secondary | ICD-10-CM | POA: Diagnosis not present

## 2015-08-01 DIAGNOSIS — Z7982 Long term (current) use of aspirin: Secondary | ICD-10-CM

## 2015-08-01 DIAGNOSIS — E7800 Pure hypercholesterolemia, unspecified: Secondary | ICD-10-CM

## 2015-08-01 DIAGNOSIS — I129 Hypertensive chronic kidney disease with stage 1 through stage 4 chronic kidney disease, or unspecified chronic kidney disease: Secondary | ICD-10-CM | POA: Diagnosis not present

## 2015-08-01 DIAGNOSIS — Z79899 Other long term (current) drug therapy: Secondary | ICD-10-CM | POA: Insufficient documentation

## 2015-08-01 DIAGNOSIS — R5383 Other fatigue: Secondary | ICD-10-CM | POA: Diagnosis present

## 2015-08-01 DIAGNOSIS — D47Z9 Other specified neoplasms of uncertain behavior of lymphoid, hematopoietic and related tissue: Secondary | ICD-10-CM

## 2015-08-01 DIAGNOSIS — E785 Hyperlipidemia, unspecified: Secondary | ICD-10-CM | POA: Diagnosis not present

## 2015-08-01 DIAGNOSIS — G8929 Other chronic pain: Secondary | ICD-10-CM

## 2015-08-01 LAB — COMPREHENSIVE METABOLIC PANEL
ALBUMIN: 3.8 g/dL (ref 3.5–5.0)
ALT: 14 U/L (ref 14–54)
AST: 16 U/L (ref 15–41)
Alkaline Phosphatase: 108 U/L (ref 38–126)
Anion gap: 11 (ref 5–15)
BUN: 24 mg/dL — AB (ref 6–20)
CHLORIDE: 104 mmol/L (ref 101–111)
CO2: 27 mmol/L (ref 22–32)
Calcium: 9.2 mg/dL (ref 8.9–10.3)
Creatinine, Ser: 1.16 mg/dL — ABNORMAL HIGH (ref 0.44–1.00)
GFR calc Af Amer: 53 mL/min — ABNORMAL LOW (ref >60)
GFR calc non Af Amer: 46 mL/min — ABNORMAL LOW (ref >60)
GLUCOSE: 122 mg/dL — AB (ref 65–99)
POTASSIUM: 4.2 mmol/L (ref 3.5–5.1)
Sodium: 142 mmol/L (ref 135–145)
Total Bilirubin: 0.4 mg/dL (ref 0.3–1.2)
Total Protein: 6.7 g/dL (ref 6.5–8.1)

## 2015-08-01 LAB — CBC WITH DIFFERENTIAL/PLATELET
BASOS ABS: 0.1 10*3/uL (ref 0–0.1)
Basophils Relative: 1 %
EOS PCT: 3 %
Eosinophils Absolute: 0.5 10*3/uL (ref 0–0.7)
HEMATOCRIT: 42.1 % (ref 35.0–47.0)
HEMOGLOBIN: 13.4 g/dL (ref 12.0–16.0)
LYMPHS ABS: 14.6 10*3/uL — AB (ref 1.0–3.6)
LYMPHS PCT: 72 %
MCH: 29.3 pg (ref 26.0–34.0)
MCHC: 31.8 g/dL — ABNORMAL LOW (ref 32.0–36.0)
MCV: 92.1 fL (ref 80.0–100.0)
Monocytes Absolute: 0.8 10*3/uL (ref 0.2–0.9)
Monocytes Relative: 4 %
NEUTROS ABS: 4 10*3/uL (ref 1.4–6.5)
NEUTROS PCT: 20 %
PLATELETS: 240 10*3/uL (ref 150–440)
RBC: 4.57 MIL/uL (ref 3.80–5.20)
RDW: 15.7 % — ABNORMAL HIGH (ref 11.5–14.5)
WBC: 20 10*3/uL — AB (ref 3.6–11.0)

## 2015-08-01 LAB — LACTATE DEHYDROGENASE: LDH: 234 U/L — ABNORMAL HIGH (ref 98–192)

## 2015-08-01 NOTE — Progress Notes (Signed)
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 I reviewed the patient's prior charts/pertinent labs/imaging in detail; findings are summarized above.   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  Patient complains of chronic fatigue. Chronic right knee pain. She is awaiting to have an MRI of the right knee/probable surgery. Her appetite is good. She is not losing any weight. She denies any unusual night sweats. She denies any unusual fevers. Denies any frequent infections. She was in the hospital in January 2017 because of bronchitis.   REVIEW OF SYSTEMS:  A complete 10 point review of system is done which is negative except mentioned above/history of present illness.   PAST MEDICAL HISTORY :  Past Medical History  Diagnosis Date  . Hypertension   . GERD (gastroesophageal reflux disease)   . Hypercholesteremia   . Cancer ([place])     cll  . CLL (chronic lymphocytic leukemia) ([place])   . MGUS (monoclonal gammopathy of unknown significance)   . Hyperlipidemia   . Arthritis   . Hemorrhoids   . Depression   . Obesity   . Anxiety   . Back pain   . 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 Neck fusion    . Breast biopsy Left 08/19/04    lt bx/clip-neg  . Wrist surgery Left 2000    FAMILY HISTORY :   Family History  Problem Relation  Age of Onset  . Heart failure Father   . Pulmonary embolism Mother   . Congestive Heart Failure Father     SOCIAL HISTORY:   Social History  Substance Use Topics  . Smoking status: Never Smoker   . Smokeless tobacco: Never Used  . Alcohol Use: No    ALLERGIES:  is allergic to oxycodone and morphine.  MEDICATIONS:  Current Outpatient Prescriptions  Medication Sig Dispense Refill  . albuterol (PROAIR HFA) 108 (90 Base) MCG/ACT inhaler Inhale into the lungs.    Marland Kitchen aspirin 325 MG EC tablet Take 650 mg by mouth daily.    Marland Kitchen escitalopram (LEXAPRO) 10 MG tablet Take 10 mg by mouth daily.    Marland Kitchen gabapentin (NEURONTIN) 100 MG capsule Take 100 mg by mouth 3 (three) times daily.    Marland Kitchen HYDROcodone-acetaminophen (NORCO/VICODIN) 5-325 MG tablet Take by mouth.    . losartan-hydrochlorothiazide (HYZAAR) 100-12.5 MG tablet Take 1 tablet by mouth daily.    . Multiple Vitamin (MULTI-VITAMINS) TABS Take 1 tablet by mouth daily.     . Multiple Vitamins-Minerals (PRESERVISION AREDS 2 PO) Take by mouth 2 (two) times daily.    . pravastatin (PRAVACHOL) 40 MG tablet TAKE 1 TABLET BY MOUTH ONCE A DAY    . propranolol (INDERAL) 40 MG tablet Take 40 mg by mouth 2 (two) times daily.      No current facility-administered medications for this visit.    PHYSICAL EXAMINATION:   BP 191/79 mmHg  Pulse 57  Temp(Src) 95.9 F (35.5 C) (Tympanic)  Resp 18  Wt 282 lb 13.6 oz (128.3 kg)  Filed Weights   08/01/15 1015  Weight: 282 lb 13.6 oz (128.3 kg)    GENERAL: Well-nourished well-developed; Alert, no distress and comfortable.   Obese. Walks with a rolling walker.  EYES: no pallor or icterus OROPHARYNX: no thrush or ulceration; good dentition  NECK: supple, no masses felt LYMPH:  no palpable lymphadenopathy in the cervical, axillary or inguinal regions LUNGS: clear to auscultation and  No wheeze or crackles HEART/CVS: regular rate & rhythm and no murmurs; No lower extremity edema ABDOMEN:abdomen soft,  non-tender and normal bowel sounds Musculoskeletal:no cyanosis of digits and no clubbing  PSYCH: alert & oriented x 3 with fluent speech NEURO: no focal motor/sensory deficits SKIN:  no rashes or significant lesions  LABORATORY DATA:  I have reviewed the data as listed    Component Value Date/Time   NA 142 08/01/2015 0943   NA 141 04/29/2012 1822   K 4.2 08/01/2015 0943   K 3.9 04/29/2012 1822   CL 104 08/01/2015 0943   CL 107 04/29/2012 1822   CO2 27 08/01/2015 0943   CO2 28 04/29/2012 1822   GLUCOSE 122* 08/01/2015 0943   GLUCOSE 129* 04/29/2012 1822   BUN 24* 08/01/2015 0943   BUN 21* 04/29/2012 1822   CREATININE 1.16* 08/01/2015 0943   CREATININE 1.02* 07/28/2014 0950   CALCIUM 9.2 08/01/2015 0943   CALCIUM 8.7 04/29/2012 1822   PROT 6.7 08/01/2015 0943   PROT 7.1 04/29/2012 1822   ALBUMIN 3.8 08/01/2015 0943   ALBUMIN 3.3* 04/29/2012 1822   AST 16 08/01/2015 0943   AST 20 04/29/2012 1822   ALT 14 08/01/2015 0943   ALT 33 04/29/2012 1822   ALKPHOS 108 08/01/2015 0943   ALKPHOS 169* 04/29/2012 1822   BILITOT 0.4 08/01/2015 0943   BILITOT 0.3 04/29/2012 1822   GFRNONAA 46* 08/01/2015 0943   GFRNONAA 55* 07/28/2014 0950   GFRNONAA 46* 07/04/2011 0949   GFRAA 53* 08/01/2015 0943   GFRAA >60 07/28/2014 0950   GFRAA 55* 07/04/2011 0949    No results found for: SPEP, UPEP  Lab Results  Component Value Date   WBC 20.0* 08/01/2015   NEUTROABS 4.0 08/01/2015   HGB 13.4 08/01/2015   HCT 42.1 08/01/2015   MCV 92.1 08/01/2015   PLT 240 08/01/2015      Chemistry      Component Value Date/Time   NA 142 08/01/2015 0943   NA 141 04/29/2012 1822   K 4.2 08/01/2015 0943   K 3.9 04/29/2012 1822   CL 104 08/01/2015 0943   CL 107 04/29/2012 1822   CO2 27 08/01/2015 0943   CO2 28 04/29/2012 1822   BUN 24* 08/01/2015 0943   BUN 21* 04/29/2012 1822   CREATININE 1.16* 08/01/2015 0943   CREATININE 1.02* 07/28/2014 0950      Component Value Date/Time   CALCIUM 9.2  08/01/2015 0943   CALCIUM 8.7 04/29/2012 1822   ALKPHOS 108 08/01/2015 0943   ALKPHOS 169* 04/29/2012 1822   AST 16 08/01/2015 0943   AST 20 04/29/2012 1822   ALT 14 08/01/2015 0943   ALT 33 04/29/2012 1822   BILITOT 0.4 08/01/2015 0943   BILITOT 0.3 04/29/2012 1822        ASSESSMENT & PLAN:   # Chronic low-grade B-cell lymphoproliferative disorder- based on peripheral blood flow cytometry. Today the white count is 20,000/absolute lymphocyte count of 14,000  with normal hemoglobin and platelets. Patient white count has been chronically elevated/unchanged. I do not think patient is vague symptoms of fatigue are related to the B cell proliferative disorder. Continue recommend surveillance at this time.  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 However most recent protein electrophoresis in October 2016 with the normal limits. I think patient has faint positive monoclonal protein only on immunofixation. Labs from today are pending. I think this is again asymptomatic.  # Chronic kidney disease creatinine 1.16/ stable.   # Patient follow-up with [name] in approximately one year/CBC CMP SIEP/repeat flow cytometry prior to next viisit.Cammie Sickle, MD 08/01/2015 10:45 AM

## 2015-08-02 DIAGNOSIS — R6883 Chills (without fever): Secondary | ICD-10-CM | POA: Diagnosis not present

## 2015-08-02 DIAGNOSIS — M25562 Pain in left knee: Secondary | ICD-10-CM | POA: Diagnosis not present

## 2015-08-02 LAB — KAPPA/LAMBDA LIGHT CHAINS
Kappa free light chain: 32.73 mg/L — ABNORMAL HIGH (ref 3.30–19.40)
Kappa, lambda light chain ratio: 4.41 — ABNORMAL HIGH (ref 0.26–1.65)
Lambda free light chains: 7.42 mg/L (ref 5.71–26.30)

## 2015-08-02 LAB — PROTEIN ELECTROPHORESIS, SERUM
A/G RATIO SPE: 1.3 (ref 0.7–1.7)
ALPHA-2-GLOBULIN: 0.9 g/dL (ref 0.4–1.0)
Albumin ELP: 3.5 g/dL (ref 2.9–4.4)
Alpha-1-Globulin: 0.2 g/dL (ref 0.0–0.4)
BETA GLOBULIN: 1.1 g/dL (ref 0.7–1.3)
Gamma Globulin: 0.5 g/dL (ref 0.4–1.8)
Globulin, Total: 2.7 g/dL (ref 2.2–3.9)
Total Protein ELP: 6.2 g/dL (ref 6.0–8.5)

## 2015-08-08 DIAGNOSIS — H353130 Nonexudative age-related macular degeneration, bilateral, stage unspecified: Secondary | ICD-10-CM | POA: Diagnosis not present

## 2015-08-09 ENCOUNTER — Other Ambulatory Visit: Payer: Self-pay | Admitting: Internal Medicine

## 2015-08-09 DIAGNOSIS — Z1231 Encounter for screening mammogram for malignant neoplasm of breast: Secondary | ICD-10-CM

## 2015-08-10 DIAGNOSIS — E7800 Pure hypercholesterolemia, unspecified: Secondary | ICD-10-CM | POA: Diagnosis not present

## 2015-08-10 DIAGNOSIS — R739 Hyperglycemia, unspecified: Secondary | ICD-10-CM | POA: Diagnosis not present

## 2015-08-11 ENCOUNTER — Ambulatory Visit
Admission: RE | Admit: 2015-08-11 | Discharge: 2015-08-11 | Disposition: A | Discharge status: Home / Self Care | Payer: Commercial Managed Care - HMO | Source: Ambulatory Visit | Attending: Orthopedic Surgery | Admitting: Orthopedic Surgery

## 2015-08-11 DIAGNOSIS — M79662 Pain in left lower leg: Secondary | ICD-10-CM | POA: Insufficient documentation

## 2015-08-11 DIAGNOSIS — M1712 Unilateral primary osteoarthritis, left knee: Secondary | ICD-10-CM | POA: Insufficient documentation

## 2015-08-11 DIAGNOSIS — M179 Osteoarthritis of knee, unspecified: Secondary | ICD-10-CM | POA: Diagnosis not present

## 2015-08-11 DIAGNOSIS — M7122 Synovial cyst of popliteal space [Baker], left knee: Secondary | ICD-10-CM | POA: Diagnosis not present

## 2015-08-11 DIAGNOSIS — M898X6 Other specified disorders of bone, lower leg: Secondary | ICD-10-CM

## 2015-08-14 ENCOUNTER — Observation Stay: Payer: Commercial Managed Care - HMO

## 2015-08-14 ENCOUNTER — Inpatient Hospital Stay
Admission: EM | Admit: 2015-08-14 | Discharge: 2015-08-16 | DRG: 175 | Disposition: A | Discharge status: Home / Self Care | Payer: Commercial Managed Care - HMO | Attending: Internal Medicine | Admitting: Internal Medicine

## 2015-08-14 ENCOUNTER — Emergency Department: Payer: Commercial Managed Care - HMO

## 2015-08-14 DIAGNOSIS — C9110 Chronic lymphocytic leukemia of B-cell type not having achieved remission: Secondary | ICD-10-CM | POA: Diagnosis present

## 2015-08-14 DIAGNOSIS — I2699 Other pulmonary embolism without acute cor pulmonale: Principal | ICD-10-CM | POA: Diagnosis present

## 2015-08-14 DIAGNOSIS — J9600 Acute respiratory failure, unspecified whether with hypoxia or hypercapnia: Secondary | ICD-10-CM | POA: Diagnosis not present

## 2015-08-14 DIAGNOSIS — I82441 Acute embolism and thrombosis of right tibial vein: Secondary | ICD-10-CM | POA: Diagnosis present

## 2015-08-14 DIAGNOSIS — G4733 Obstructive sleep apnea (adult) (pediatric): Secondary | ICD-10-CM | POA: Diagnosis present

## 2015-08-14 DIAGNOSIS — Z6837 Body mass index (BMI) 37.0-37.9, adult: Secondary | ICD-10-CM | POA: Diagnosis not present

## 2015-08-14 DIAGNOSIS — I214 Non-ST elevation (NSTEMI) myocardial infarction: Secondary | ICD-10-CM

## 2015-08-14 DIAGNOSIS — D472 Monoclonal gammopathy: Secondary | ICD-10-CM | POA: Diagnosis present

## 2015-08-14 DIAGNOSIS — K219 Gastro-esophageal reflux disease without esophagitis: Secondary | ICD-10-CM | POA: Diagnosis present

## 2015-08-14 DIAGNOSIS — I509 Heart failure, unspecified: Secondary | ICD-10-CM | POA: Diagnosis not present

## 2015-08-14 DIAGNOSIS — E7800 Pure hypercholesterolemia, unspecified: Secondary | ICD-10-CM | POA: Diagnosis present

## 2015-08-14 DIAGNOSIS — R0600 Dyspnea, unspecified: Secondary | ICD-10-CM | POA: Diagnosis not present

## 2015-08-14 DIAGNOSIS — M7122 Synovial cyst of popliteal space [Baker], left knee: Secondary | ICD-10-CM | POA: Diagnosis not present

## 2015-08-14 DIAGNOSIS — E785 Hyperlipidemia, unspecified: Secondary | ICD-10-CM | POA: Diagnosis present

## 2015-08-14 DIAGNOSIS — Z981 Arthrodesis status: Secondary | ICD-10-CM

## 2015-08-14 DIAGNOSIS — J9601 Acute respiratory failure with hypoxia: Secondary | ICD-10-CM | POA: Diagnosis present

## 2015-08-14 DIAGNOSIS — Z9889 Other specified postprocedural states: Secondary | ICD-10-CM

## 2015-08-14 DIAGNOSIS — M1990 Unspecified osteoarthritis, unspecified site: Secondary | ICD-10-CM | POA: Diagnosis present

## 2015-08-14 DIAGNOSIS — R0602 Shortness of breath: Secondary | ICD-10-CM | POA: Diagnosis present

## 2015-08-14 DIAGNOSIS — Z8249 Family history of ischemic heart disease and other diseases of the circulatory system: Secondary | ICD-10-CM | POA: Diagnosis not present

## 2015-08-14 DIAGNOSIS — R748 Abnormal levels of other serum enzymes: Secondary | ICD-10-CM | POA: Diagnosis not present

## 2015-08-14 DIAGNOSIS — Z79899 Other long term (current) drug therapy: Secondary | ICD-10-CM | POA: Diagnosis not present

## 2015-08-14 DIAGNOSIS — Z885 Allergy status to narcotic agent status: Secondary | ICD-10-CM | POA: Diagnosis not present

## 2015-08-14 DIAGNOSIS — E669 Obesity, unspecified: Secondary | ICD-10-CM | POA: Diagnosis present

## 2015-08-14 DIAGNOSIS — I10 Essential (primary) hypertension: Secondary | ICD-10-CM | POA: Diagnosis not present

## 2015-08-14 DIAGNOSIS — I110 Hypertensive heart disease with heart failure: Secondary | ICD-10-CM | POA: Diagnosis present

## 2015-08-14 DIAGNOSIS — I2609 Other pulmonary embolism with acute cor pulmonale: Secondary | ICD-10-CM | POA: Diagnosis not present

## 2015-08-14 DIAGNOSIS — Z7982 Long term (current) use of aspirin: Secondary | ICD-10-CM | POA: Diagnosis not present

## 2015-08-14 LAB — URINALYSIS COMPLETE WITH MICROSCOPIC (ARMC ONLY)
BILIRUBIN URINE: NEGATIVE
Bacteria, UA: NONE SEEN
Glucose, UA: NEGATIVE mg/dL
Hgb urine dipstick: NEGATIVE
KETONES UR: NEGATIVE mg/dL
Nitrite: NEGATIVE
PH: 5 (ref 5.0–8.0)
Protein, ur: NEGATIVE mg/dL
SPECIFIC GRAVITY, URINE: 1.013 (ref 1.005–1.030)

## 2015-08-14 LAB — CBC
HEMATOCRIT: 41.5 % (ref 35.0–47.0)
Hemoglobin: 13.5 g/dL (ref 12.0–16.0)
MCH: 29.6 pg (ref 26.0–34.0)
MCHC: 32.6 g/dL (ref 32.0–36.0)
MCV: 90.6 fL (ref 80.0–100.0)
Platelets: 155 10*3/uL (ref 150–440)
RBC: 4.58 MIL/uL (ref 3.80–5.20)
RDW: 15.8 % — AB (ref 11.5–14.5)
WBC: 18.5 10*3/uL — AB (ref 3.6–11.0)

## 2015-08-14 LAB — CBC WITH DIFFERENTIAL/PLATELET
BASOS ABS: 0 10*3/uL (ref 0–0.1)
Basophils Relative: 0 %
EOS ABS: 0.2 10*3/uL (ref 0–0.7)
Eosinophils Relative: 1 %
HEMATOCRIT: 41.7 % (ref 35.0–47.0)
HEMOGLOBIN: 13.4 g/dL (ref 12.0–16.0)
LYMPHS PCT: 53 %
Lymphs Abs: 10.8 10*3/uL — ABNORMAL HIGH (ref 1.0–3.6)
MCH: 30.1 pg (ref 26.0–34.0)
MCHC: 32.3 g/dL (ref 32.0–36.0)
MCV: 93.2 fL (ref 80.0–100.0)
MONOS PCT: 3 %
Monocytes Absolute: 0.6 10*3/uL (ref 0.2–0.9)
NEUTROS PCT: 43 %
Neutro Abs: 8.7 10*3/uL — ABNORMAL HIGH (ref 1.4–6.5)
Platelets: 176 10*3/uL (ref 150–440)
RBC: 4.47 MIL/uL (ref 3.80–5.20)
RDW: 16.1 % — ABNORMAL HIGH (ref 11.5–14.5)
WBC: 20.3 10*3/uL — ABNORMAL HIGH (ref 3.6–11.0)

## 2015-08-14 LAB — BLOOD GAS, ARTERIAL
ACID-BASE EXCESS: 3.4 mmol/L — AB (ref 0.0–3.0)
Bicarbonate: 26.8 mEq/L (ref 21.0–28.0)
DELIVERY SYSTEMS: POSITIVE
Expiratory PAP: 5
FIO2: 0.3
Inspiratory PAP: 10
O2 Saturation: 94.4 %
PATIENT TEMPERATURE: 37
pCO2 arterial: 36 mmHg (ref 32.0–48.0)
pH, Arterial: 7.48 — ABNORMAL HIGH (ref 7.350–7.450)
pO2, Arterial: 67 mmHg — ABNORMAL LOW (ref 83.0–108.0)

## 2015-08-14 LAB — COMPREHENSIVE METABOLIC PANEL
ALT: 19 U/L (ref 14–54)
ANION GAP: 9 (ref 5–15)
AST: 24 U/L (ref 15–41)
Albumin: 3.6 g/dL (ref 3.5–5.0)
Alkaline Phosphatase: 91 U/L (ref 38–126)
BILIRUBIN TOTAL: 0.4 mg/dL (ref 0.3–1.2)
BUN: 20 mg/dL (ref 6–20)
CO2: 28 mmol/L (ref 22–32)
Calcium: 9.1 mg/dL (ref 8.9–10.3)
Chloride: 107 mmol/L (ref 101–111)
Creatinine, Ser: 0.98 mg/dL (ref 0.44–1.00)
GFR calc Af Amer: >60 mL/min (ref >60)
GFR calc non Af Amer: 56 mL/min — ABNORMAL LOW (ref >60)
GLUCOSE: 159 mg/dL — AB (ref 65–99)
Potassium: 4.3 mmol/L (ref 3.5–5.1)
SODIUM: 144 mmol/L (ref 135–145)
TOTAL PROTEIN: 6.2 g/dL — AB (ref 6.5–8.1)

## 2015-08-14 LAB — BRAIN NATRIURETIC PEPTIDE: B NATRIURETIC PEPTIDE 5: 463 pg/mL — AB (ref 0.0–100.0)

## 2015-08-14 LAB — TROPONIN I
TROPONIN I: 0.16 ng/mL — AB (ref <0.031)
Troponin I: 0.21 ng/mL — ABNORMAL HIGH (ref <0.031)

## 2015-08-14 LAB — CREATININE, SERUM
Creatinine, Ser: 1.11 mg/dL — ABNORMAL HIGH (ref 0.44–1.00)
GFR, EST AFRICAN AMERICAN: 56 mL/min — AB (ref >60)
GFR, EST NON AFRICAN AMERICAN: 48 mL/min — AB (ref >60)

## 2015-08-14 LAB — APTT: APTT: 30 s (ref 24–36)

## 2015-08-14 LAB — MRSA PCR SCREENING: MRSA BY PCR: NEGATIVE

## 2015-08-14 LAB — PROTIME-INR
INR: 1.14
PROTHROMBIN TIME: 14.8 s (ref 11.4–15.0)

## 2015-08-14 MED ORDER — GABAPENTIN 300 MG PO CAPS
300.0000 mg | ORAL_CAPSULE | Freq: Three times a day (TID) | ORAL | Status: DC
  Administered 2015-08-14 – 2015-08-16 (×4): 300 mg via ORAL
  Filled 2015-08-14 (×4): qty 1

## 2015-08-14 MED ORDER — ONDANSETRON HCL 4 MG/2ML IJ SOLN: 4.0000 mg | Freq: Four times a day (QID) | INTRAMUSCULAR | Status: DC | PRN

## 2015-08-14 MED ORDER — ONDANSETRON HCL 4 MG PO TABS: 4.0000 mg | ORAL_TABLET | Freq: Four times a day (QID) | ORAL | Status: DC | PRN

## 2015-08-14 MED ORDER — IPRATROPIUM-ALBUTEROL 0.5-2.5 (3) MG/3ML IN SOLN
3.0000 mL | RESPIRATORY_TRACT | Status: DC
  Administered 2015-08-15 – 2015-08-16 (×8): 3 mL via RESPIRATORY_TRACT
  Filled 2015-08-14 (×8): qty 3

## 2015-08-14 MED ORDER — OCUVITE-LUTEIN PO CAPS
1.0000 | ORAL_CAPSULE | Freq: Two times a day (BID) | ORAL | Status: DC
  Administered 2015-08-14 – 2015-08-16 (×4): 1 via ORAL
  Filled 2015-08-14 (×4): qty 1

## 2015-08-14 MED ORDER — ACETAMINOPHEN 325 MG PO TABS: 650.0000 mg | ORAL_TABLET | Freq: Four times a day (QID) | ORAL | Status: DC | PRN

## 2015-08-14 MED ORDER — SODIUM CHLORIDE 0.9% FLUSH: 3.0000 mL | INTRAVENOUS | Status: DC | PRN

## 2015-08-14 MED ORDER — ASPIRIN 325 MG PO TABS
325.0000 mg | ORAL_TABLET | Freq: Every day | ORAL | Status: DC
  Administered 2015-08-14 – 2015-08-15 (×2): 325 mg via ORAL
  Filled 2015-08-14 (×2): qty 1

## 2015-08-14 MED ORDER — LORAZEPAM 1 MG PO TABS
1.0000 mg | ORAL_TABLET | Freq: Once | ORAL | Status: AC
  Administered 2015-08-14: 1 mg via ORAL

## 2015-08-14 MED ORDER — LEVOFLOXACIN IN D5W 500 MG/100ML IV SOLN
500.0000 mg | INTRAVENOUS | Status: DC
  Filled 2015-08-14: qty 100

## 2015-08-14 MED ORDER — LORAZEPAM 1 MG PO TABS
ORAL_TABLET | ORAL | Status: AC
  Filled 2015-08-14: qty 1

## 2015-08-14 MED ORDER — HEPARIN (PORCINE) IN NACL 100-0.45 UNIT/ML-% IJ SOLN
1350.0000 [IU]/h | INTRAMUSCULAR | Status: DC
  Administered 2015-08-14: 1350 [IU]/h via INTRAVENOUS
  Filled 2015-08-14 (×2): qty 250

## 2015-08-14 MED ORDER — FUROSEMIDE 10 MG/ML IJ SOLN
10.0000 mg | Freq: Once | INTRAMUSCULAR | Status: AC
  Administered 2015-08-14: 10 mg via INTRAVENOUS
  Filled 2015-08-14: qty 4

## 2015-08-14 MED ORDER — IPRATROPIUM-ALBUTEROL 0.5-2.5 (3) MG/3ML IN SOLN
3.0000 mL | Freq: Once | RESPIRATORY_TRACT | Status: AC
  Administered 2015-08-14: 3 mL via RESPIRATORY_TRACT
  Filled 2015-08-14: qty 3

## 2015-08-14 MED ORDER — ADULT MULTIVITAMIN W/MINERALS CH
1.0000 | ORAL_TABLET | Freq: Every day | ORAL | Status: DC
  Administered 2015-08-14 – 2015-08-16 (×3): 1 via ORAL
  Filled 2015-08-14 (×3): qty 1

## 2015-08-14 MED ORDER — PRAVASTATIN SODIUM 20 MG PO TABS
40.0000 mg | ORAL_TABLET | Freq: Every day | ORAL | Status: DC
  Administered 2015-08-14 – 2015-08-16 (×3): 40 mg via ORAL
  Filled 2015-08-14 (×3): qty 2

## 2015-08-14 MED ORDER — ASPIRIN 81 MG PO CHEW
324.0000 mg | CHEWABLE_TABLET | Freq: Once | ORAL | Status: AC
  Administered 2015-08-14: 324 mg via ORAL

## 2015-08-14 MED ORDER — METHYLPREDNISOLONE SODIUM SUCC 125 MG IJ SOLR
60.0000 mg | Freq: Four times a day (QID) | INTRAMUSCULAR | Status: DC
Start: 2015-08-14 — End: 2015-08-15
  Administered 2015-08-15 (×2): 60 mg via INTRAVENOUS
  Filled 2015-08-14 (×2): qty 2

## 2015-08-14 MED ORDER — ASPIRIN 81 MG PO CHEW
CHEWABLE_TABLET | ORAL | Status: AC
  Filled 2015-08-14: qty 4

## 2015-08-14 MED ORDER — HEPARIN BOLUS VIA INFUSION
5000.0000 [IU] | Freq: Once | INTRAVENOUS | Status: AC
  Administered 2015-08-14: 5000 [IU] via INTRAVENOUS
  Filled 2015-08-14: qty 5000

## 2015-08-14 MED ORDER — LEVOFLOXACIN IN D5W 750 MG/150ML IV SOLN
750.0000 mg | Freq: Once | INTRAVENOUS | Status: AC
  Administered 2015-08-14: 750 mg via INTRAVENOUS
  Filled 2015-08-14: qty 150

## 2015-08-14 MED ORDER — ENOXAPARIN SODIUM 40 MG/0.4ML ~~LOC~~ SOLN
40.0000 mg | SUBCUTANEOUS | Status: DC
  Administered 2015-08-14: 40 mg via SUBCUTANEOUS
  Filled 2015-08-14: qty 0.4

## 2015-08-14 MED ORDER — ESCITALOPRAM OXALATE 10 MG PO TABS
10.0000 mg | ORAL_TABLET | Freq: Every day | ORAL | Status: DC
  Administered 2015-08-14 – 2015-08-16 (×3): 10 mg via ORAL
  Filled 2015-08-14 (×3): qty 1

## 2015-08-14 MED ORDER — METHYLPREDNISOLONE SODIUM SUCC 125 MG IJ SOLR
125.0000 mg | Freq: Once | INTRAMUSCULAR | Status: AC
  Administered 2015-08-14: 125 mg via INTRAVENOUS
  Filled 2015-08-14: qty 2

## 2015-08-14 MED ORDER — HYDROCODONE-ACETAMINOPHEN 5-325 MG PO TABS: 1.0000 | ORAL_TABLET | Freq: Four times a day (QID) | ORAL | Status: DC | PRN

## 2015-08-14 MED ORDER — SODIUM CHLORIDE 0.9 % IV SOLN: 250.0000 mL | INTRAVENOUS | Status: DC | PRN

## 2015-08-14 MED ORDER — ACETAMINOPHEN 650 MG RE SUPP: 650.0000 mg | Freq: Four times a day (QID) | RECTAL | Status: DC | PRN

## 2015-08-14 MED ORDER — SODIUM CHLORIDE 0.9% FLUSH
3.0000 mL | Freq: Two times a day (BID) | INTRAVENOUS | Status: DC
Start: 2015-08-14 — End: 2015-08-16
  Administered 2015-08-14 – 2015-08-16 (×3): 3 mL via INTRAVENOUS

## 2015-08-14 MED ORDER — PANTOPRAZOLE SODIUM 40 MG PO TBEC
40.0000 mg | DELAYED_RELEASE_TABLET | Freq: Every day | ORAL | Status: DC
  Administered 2015-08-14 – 2015-08-15 (×2): 40 mg via ORAL
  Filled 2015-08-14 (×2): qty 1

## 2015-08-14 MED ORDER — IOPAMIDOL (ISOVUE-370) INJECTION 76%
75.0000 mL | Freq: Once | INTRAVENOUS | Status: AC | PRN
  Administered 2015-08-14: 75 mL via INTRAVENOUS

## 2015-08-14 NOTE — Progress Notes (Signed)
ANTICOAGULATION CONSULT NOTE - Initial Consult  Pharmacy Consult for heparin Indication: pulmonary embolus  Allergies  Allergen Reactions  . Oxycodone Other (See Comments)    Reaction:  Hallucinations  . Morphine Other (See Comments)    Reaction:  Hallucinations    Patient Measurements: Height: 5\' 5"  (165.1 cm) Weight: 224 lb (101.606 kg) IBW/kg (Calculated) : 57 Heparin Dosing Weight: 80.4 kg  Vital Signs: Temp: 97.6 F (36.4 C) (04/24 1745) Temp Source: Oral (04/24 1745) BP: 139/75 mmHg (04/24 2100) Pulse Rate: 67 (04/24 2100)  Labs:  Recent Labs  08/14/15 1753 08/14/15 2107  HGB 13.4 13.5  HCT 41.7 41.5  PLT 176 155  CREATININE 0.98 1.11*  TROPONINI 0.21*  --     Estimated Creatinine Clearance: 53.3 mL/min (by C-G formula based on Cr of 1.11).   Medical History: Past Medical History  Diagnosis Date  . Hypertension   . GERD (gastroesophageal reflux disease)   . Hypercholesteremia   . Cancer ([place])     cll  . CLL (chronic lymphocytic leukemia) ([place])   . MGUS (monoclonal gammopathy of unknown significance)   . Hyperlipidemia   . Arthritis   . Hemorrhoids   . Depression   . Obesity   . Anxiety   . Back pain   . 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 heparin      Assessment: 49 yof with PE on CTA chest. Pharmacy consulted to dose heparin. Patient did receive single dose of LMWH 40 mg x 1 this evening.  Goal of Therapy:  Heparin level 0.3-0.7 units/ml Monitor platelets by anticoagulation protocol: Yes   Plan:  Give 5000 units bolus x 1 Start heparin infusion at 1350 units/hr Check anti-Xa level in 8 hours and daily while on heparin Continue to monitor H&H and platelets  Laural Benes, Pharm.D., BCPS Clinical Pharmacist 08/14/2015,10:15 PM

## 2015-08-14 NOTE — ED Notes (Signed)
Respiratory called to go with pt to CT - They were to busy at this time and stated that they would call me when they were ready to come down - Ben in [place] aware

## 2015-08-14 NOTE — ED Notes (Signed)
Lab called troponin of 0.21 - reported to Dr Edd Fabian

## 2015-08-14 NOTE — Progress Notes (Addendum)
Contacted by radiology regarding results of CTA chest. Patient has pulmonary embolism with some right heart strain. Full review of imaging pending, however we'll start heparin drip and place routine consult intensivist. Patient's vital signs are stable at this time.    Unsure what Code PE is referred to in radiology note.  However, attempted to page number listed and could not connect.  Jacqulyn Bath [facility] 08/14/2015, 10:10 PM

## 2015-08-14 NOTE — Progress Notes (Signed)
Patient requesting to have bipap taken off.  Weaned patient from bipap post abg results.  Placed on 6 lpm [place].  Patient tol. Well spo2 96%

## 2015-08-14 NOTE — H&P (Signed)
[place] [facility] [place] NAME: Jamie Phillips    MR#:  [id number]  DATE OF BIRTH:  [birth year]  DATE OF ADMISSION:  08/14/2015  PRIMARY CARE PHYSICIAN: Kirk Ruths., MD   REQUESTING/REFERRING PHYSICIAN: Joanne Gavel MD  CHIEF COMPLAINT:   Chief Complaint  Patient presents with  . Shortness of Breath    HISTORY OF PRESENT ILLNESS: Jamie Phillips  is a 75 y.o. female with a known history of Sleep apnea, hypertension, GERD, hyperlipidemia and CLL presents with acute onset of shortness of breath. Patient reports that she's been short of breath for the past 3-4 days. She has not had any fevers chills or cough. She has not been wheezing. Patient had a chest x-ray which is negative. She denies history of having any lung disease but does see Dr. Gust Brooms. And is and is on inhaler.  No fevers chills no chest pains. He complains of recent gout attack     PAST MEDICAL HISTORY:   Past Medical History  Diagnosis Date  . Hypertension   . GERD (gastroesophageal reflux disease)   . Hypercholesteremia   . Cancer ([place])     cll  . CLL (chronic lymphocytic leukemia) ([place])   . MGUS (monoclonal gammopathy of unknown significance)   . Hyperlipidemia   . Arthritis   . Hemorrhoids   . Depression   . Obesity   . Anxiety   . Back pain   . 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 Neck fusion    . Breast biopsy Left 08/19/04    lt bx/clip-neg  . Wrist surgery Left 2000    SOCIAL HISTORY:  Social History  Substance Use Topics  . Smoking status: Never Smoker   . Smokeless tobacco: Never Used  . Alcohol Use: No    FAMILY HISTORY:  Family History  Problem Relation Age of Onset  . Heart failure Father   . Pulmonary embolism Mother   . Congestive Heart Failure Father     DRUG ALLERGIES:  Allergies  Allergen Reactions  . Oxycodone Other (See  Comments)    Reaction:  Hallucinations  . Morphine Other (See Comments)    Reaction:  Hallucinations    REVIEW OF SYSTEMS:   CONSTITUTIONAL: No fever, fatigue or weakness.  EYES: No blurred or double vision.  EARS, NOSE, AND THROAT: No tinnitus or ear pain.  RESPIRATORY: No cough,Positive shortness of breath, no wheezing or hemoptysis.  CARDIOVASCULAR: No chest pain, orthopnea, edema.  GASTROINTESTINAL: No nausea, vomiting, diarrhea or abdominal pain.  GENITOURINARY: No dysuria, hematuria.  ENDOCRINE: No polyuria, nocturia,  HEMATOLOGY: No anemia, easy bruising or bleeding SKIN: No rash or lesion. MUSCULOSKELETAL: No joint pain or arthritis.  Positive gout NEUROLOGIC: No tingling, numbness, weakness.  PSYCHIATRY: No anxiety or depression.   MEDICATIONS AT HOME:  Prior to Admission medications   Medication Sig Start Date End Date Taking? Authorizing Provider  albuterol (PROVENTIL HFA;VENTOLIN HFA) 108 (90 Base) MCG/ACT inhaler Inhale 2 puffs into the lungs every 6 (six) hours as needed for wheezing or shortness of breath.   Yes Historical Provider, MD  aspirin 325 MG tablet Take 325 mg by mouth daily.   Yes Historical Provider, MD  escitalopram (LEXAPRO) 10 MG tablet Take 10 mg by mouth daily.   Yes Historical Provider, MD  gabapentin (NEURONTIN) 300 MG capsule Take 300 mg by mouth 3 (three) times  daily.   Yes Historical Provider, MD  HYDROcodone-acetaminophen (NORCO/VICODIN) 5-325 MG tablet Take 1 tablet by mouth every 6 (six) hours as needed for moderate pain.    Yes Historical Provider, MD  losartan-hydrochlorothiazide (HYZAAR) 100-12.5 MG tablet Take 1 tablet by mouth daily.   Yes Historical Provider, MD  Multiple Vitamin (MULTIVITAMIN WITH MINERALS) TABS tablet Take 1 tablet by mouth daily.   Yes Historical Provider, MD  Multiple Vitamins-Minerals (PRESERVISION AREDS 2 PO) Take 1 capsule by mouth 2 (two) times daily.    Yes Historical Provider, MD  pantoprazole (PROTONIX) 40 MG  tablet Take 40 mg by mouth daily.   Yes Historical Provider, MD  pravastatin (PRAVACHOL) 40 MG tablet Take 40 mg by mouth daily.    Yes Historical Provider, MD  propranolol (INDERAL) 40 MG tablet Take 40 mg by mouth 2 (two) times daily.    Yes Historical Provider, MD      PHYSICAL EXAMINATION:   VITAL SIGNS: Blood pressure 133/81, pulse 66, temperature 97.6 F (36.4 C), temperature source Oral, resp. rate 31, height 5\' 5"  (1.651 m), weight 101.606 kg (224 lb), SpO2 94 %.  GENERAL:  75 y.o.-year-old patient lying in the bed with Acute respiratory distress  EYES: Pupils equal, round, reactive to light and accommodation. No scleral icterus. Extraocular muscles intact.  HEENT: Head atraumatic, normocephalic. Oropharynx and nasopharynx clear.  NECK:  Supple, no jugular venous distention. No thyroid enlargement, no tenderness.  LUNGS:  Decrease b/s , the sensory muscle usage CARDIOVASCULAR: S1, S2 normal. No murmurs, rubs, or gallops.  ABDOMEN: Soft, nontender, nondistended. Bowel sounds present. No organomegaly or mass.  EXTREMITIES: No pedal edema, cyanosis, or clubbing.  NEUROLOGIC: Cranial nerves II through XII are intact. Muscle strength 5/5 in all extremities. Sensation intact. Gait not checked.  PSYCHIATRIC: The patient is alert and oriented x 3.  SKIN: No obvious rash, lesion, or ulcer.   LABORATORY PANEL:   CBC  Recent Labs Lab 08/14/15 1753  WBC 20.3*  HGB 13.4  HCT 41.7  PLT 176  MCV 93.2  MCH 30.1  MCHC 32.3  RDW 16.1*  LYMPHSABS 10.8*  MONOABS 0.6  EOSABS 0.2  BASOSABS 0.0   ------------------------------------------------------------------------------------------------------------------  Chemistries   Recent Labs Lab 08/14/15 1753  NA 144  K 4.3  CL 107  CO2 28  GLUCOSE 159*  BUN 20  CREATININE 0.98  CALCIUM 9.1  AST 24  ALT 19  ALKPHOS 91  BILITOT 0.4    ------------------------------------------------------------------------------------------------------------------ estimated creatinine clearance is 60.4 mL/min (by C-G formula based on Cr of 0.98). ------------------------------------------------------------------------------------------------------------------ No results for input(s): TSH, T4TOTAL, T3FREE, THYROIDAB in the last 72 hours.  Invalid input(s): FREET3   Coagulation profile No results for input(s): INR, PROTIME in the last 168 hours. ------------------------------------------------------------------------------------------------------------------- No results for input(s): DDIMER in the last 72 hours. -------------------------------------------------------------------------------------------------------------------  Cardiac Enzymes  Recent Labs Lab 08/14/15 1753  TROPONINI 0.21*   ------------------------------------------------------------------------------------------------------------------ Invalid input(s): POCBNP  ---------------------------------------------------------------------------------------------------------------  Urinalysis    Component Value Date/Time   COLORURINE Yellow 04/29/2012 1921   APPEARANCEUR Hazy 04/29/2012 1921   LABSPEC 1.016 04/29/2012 1921   PHURINE 5.0 04/29/2012 1921   GLUCOSEU Negative 04/29/2012 1921   HGBUR Negative 04/29/2012 1921   BILIRUBINUR Negative 04/29/2012 1921   KETONESUR Negative 04/29/2012 1921   PROTEINUR Negative 04/29/2012 1921   NITRITE Negative 04/29/2012 1921   LEUKOCYTESUR Trace 04/29/2012 1921     RADIOLOGY: Dg Chest Portable 1 View  08/14/2015  CLINICAL DATA:  Short of breath EXAM: PORTABLE CHEST  1 VIEW COMPARISON:  05/11/2015 FINDINGS: Mild cardiomegaly. Normal vascularity. Lungs clear. No pneumothorax. No pleural effusion. IMPRESSION: No active disease. Electronically Signed   By: Marybelle Killings M.D.   On: 08/14/2015 18:31    EKG: Orders placed  or performed during the hospital encounter of 08/14/15  . EKG 12-Lead  . EKG 12-Lead    IMPRESSION AND PLAN: Patient is a 75 year old white female presenting with acute respiratory distress  1. Acute respiratory failure possibly related to acute COPD flare however no history of COPD no smoking At this time will treat with nebulizers, steroids and antibiotics I have discussed the case with pulmonary they will see the patient Obtain ABG Check a CT to rule out pulmonary embolism Obtain echo to evaluate for pulmonary hypertension  2. Elevated troponin possibly related to acute respiratory failure we'll cycle troponins check echo as patient on aspirin  3. Hypertension hold blood pressure medication in light of hypotension earlier  4. GERD continue PPI  5. Hypercholesterolemia continue Pravachol  6. Misc: lovenox      All the records are reviewed and case discussed with ED provider. Management plans discussed with the patient, family and they are in agreement.  CODE STATUS:    Code Status Orders        Start     Ordered   08/14/15 1947  Full code   Continuous     08/14/15 1948    Code Status History    Date Active Date Inactive Code Status Order ID Comments User Context   05/11/2015  6:45 PM 05/12/2015  7:41 PM Full Code [id number]  Hillary Bow, MD ED       TOTAL TIME TAKING CARE OF THIS PATIENT: 55 minutes.    Dustin Flock M.D on 08/14/2015 at 7:55 PM  Between 7am to 6pm - Pager - [phone]  After 6pm go to [email or web address] - password [email or web address] [facility]  Office  [phone]  CC: Primary care physician; Kirk Ruths., MD

## 2015-08-14 NOTE — ED Provider Notes (Signed)
[facility] Emergency Department Provider Note  ____________________________________________  Time seen: Approximately 5:57 PM  I have reviewed the triage vital signs and the nursing notes.   HISTORY  Chief Complaint Shortness of Breath    HPI Jamie Phillips is a 75 y.o. female history of GERD, hypertension, CLL, obesity, history of dyspnea on exertion with wheezing on albuterol and followed by pulmonology who presents for evaluation of 3 or 4 days of shortness of breath, gradual onset, constant since onset, severe, worse with exertion and lying flat. No chest pain. No cough, no vomiting, diarrhea, fevers or chills. She reports that earlier this month she was treated with prednisone for gout and reports that her breathing had significantly improved. She was on the prednisone but when she finished the prednisone her shortness of breath returned.   Past Medical History  Diagnosis Date  . Hypertension   . GERD (gastroesophageal reflux disease)   . Hypercholesteremia   . Cancer ([place])     cll  . CLL (chronic lymphocytic leukemia) ([place])   . MGUS (monoclonal gammopathy of unknown significance)   . Hyperlipidemia   . Arthritis   . Hemorrhoids   . Depression   . Obesity   . Anxiety   . Back pain   . 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 Acute bronchitis 05/11/2015  . Essential hypertension 05/04/2015  . Hyperlipidemia 05/04/2015  . Pain in the chest 05/04/2015  . SOB (shortness of breath) 05/04/2015    Past Surgical History  Procedure Laterality Date  . Neck fusion    . Breast biopsy Left 08/19/04    lt bx/clip-neg  . Wrist surgery Left 2000    Current Outpatient Rx  Name  Route  Sig  Dispense  Refill  . albuterol (PROAIR HFA) 108 (90 Base) MCG/ACT inhaler   Inhalation   Inhale into the lungs.         Marland Kitchen aspirin 325 MG EC tablet   Oral   Take 650 mg by mouth daily.          Marland Kitchen escitalopram (LEXAPRO) 10 MG tablet   Oral   Take 10 mg by mouth daily.         Marland Kitchen gabapentin (NEURONTIN) 100 MG capsule   Oral   Take 100 mg by mouth 3 (three) times daily.         Marland Kitchen HYDROcodone-acetaminophen (NORCO/VICODIN) 5-325 MG tablet   Oral   Take by mouth.         . losartan-hydrochlorothiazide (HYZAAR) 100-12.5 MG tablet   Oral   Take 1 tablet by mouth daily.         . Multiple Vitamin (MULTI-VITAMINS) TABS   Oral   Take 1 tablet by mouth daily.          . Multiple Vitamins-Minerals (PRESERVISION AREDS 2 PO)   Oral   Take by mouth 2 (two) times daily.         . pravastatin (PRAVACHOL) 40 MG tablet      TAKE 1 TABLET BY MOUTH ONCE A DAY         . propranolol (INDERAL) 40 MG tablet   Oral   Take 40 mg by mouth 2 (two) times daily.            Allergies Oxycodone and Morphine  Family History  Problem Relation Age of Onset  . Heart failure Father   .  Pulmonary embolism Mother   . Congestive Heart Failure Father     Social History Social History  Substance Use Topics  . Smoking status: Never Smoker   . Smokeless tobacco: Never Used  . Alcohol Use: No    Review of Systems Constitutional: No fever/chills Eyes: No visual changes. ENT: No sore throat. Cardiovascular: Denies chest pain. Respiratory: + shortness of breath. Gastrointestinal: No abdominal pain.  No nausea, no vomiting.  No diarrhea.  No constipation. Genitourinary: Negative for dysuria. Musculoskeletal: Negative for back pain. Skin: Negative for rash. Neurological: Negative for headaches, focal weakness or numbness.  10-point ROS otherwise negative.  ____________________________________________   PHYSICAL EXAM:  VITAL SIGNS: ED Triage Vitals  Enc Vitals Group     BP 08/14/15 1745 94/63 mmHg     Pulse Rate 08/14/15 1745 73     Resp 08/14/15 1745 30     Temp 08/14/15 1745 97.6 F (36.4 C)     Temp Source 08/14/15 1745 Oral     SpO2 08/14/15 1745 87 %      Weight 08/14/15 1745 224 lb (101.606 kg)     Height 08/14/15 1745 5\' 5"  (1.651 m)     Head Cir --      Peak Flow --      Pain Score 08/14/15 1748 0     Pain Loc --      Pain Edu? --      Excl. in [place]? --     Constitutional: Alert and oriented. In mild to moderate respiratory distress but able to speak in short sentences. Eyes: Conjunctivae are normal. PERRL. EOMI. Head: Atraumatic. Nose: No congestion/rhinnorhea. Mouth/Throat: Mucous membranes are moist.  Oropharynx non-erythematous. Neck: No stridor.  Supple without meningismus. Cardiovascular: Normal rate, regular rhythm. Grossly normal heart sounds.  Good peripheral circulation. Respiratory: Tachypnea with increased work of breathing, diminished breath sounds throughout all lung fields. Poor air movement. Gastrointestinal: Soft and nontender. No distention.No CVA tenderness. Genitourinary: Deferred Musculoskeletal: No pitting edema, calf swelling/asymmetry or tenderness. Neurologic:  Normal speech and language. No gross focal neurologic deficits are appreciated. No gait instability. Skin:  Skin is warm, dry and intact. No rash noted. Psychiatric: Mood and affect are normal. Speech and behavior are normal.  ____________________________________________   LABS (all labs ordered are listed, but only abnormal results are displayed)  Labs Reviewed  CBC WITH DIFFERENTIAL/PLATELET - Abnormal; Notable for the following:    WBC 20.3 (*)    RDW 16.1 (*)    All other components within normal limits  COMPREHENSIVE METABOLIC PANEL - Abnormal; Notable for the following:    Glucose, Bld 159 (*)    Total Protein 6.2 (*)    GFR calc non Af Amer 56 (*)    All other components within normal limits  TROPONIN I - Abnormal; Notable for the following:    Troponin I 0.21 (*)    All other components within normal limits  BRAIN NATRIURETIC PEPTIDE - Abnormal; Notable for the following:    B Natriuretic Peptide 463.0 (*)    All other  components within normal limits  CULTURE, BLOOD (ROUTINE X 2)  CULTURE, BLOOD (ROUTINE X 2)   ____________________________________________  EKG  ED ECG REPORT I, Joanne Gavel, the attending physician, personally viewed and interpreted this ECG.   Date: 08/14/2015  EKG Time: 17:51  Rate: 70  Rhythm: normal sinus rhythm  Axis: normal  Intervals:none  ST&T Change: No acute ST elevation. Q waves in lead 3 and aVF. T-wave inversion  in V2, V3, V4, V5, V6.  ____________________________________________  RADIOLOGY  CXR IMPRESSION: No active disease. ____________________________________________   PROCEDURES  Procedure(s) performed: None  Critical Care performed: Yes, see critical care note(s). Total critical care time spent 35 minutes.  ____________________________________________   INITIAL IMPRESSION / ASSESSMENT AND PLAN / ED COURSE  Pertinent labs & imaging results that were available during my care of the patient were reviewed by me and considered in my medical decision making (see chart for details).  Jamie Phillips is a 75 y.o. female history of GERD, hypertension, CLL, obesity, history of dyspnea on exertion with wheezing on albuterol and followed by pulmonology who presents for evaluation of 3 or 4 days of shortness of breath. On exam she is in moderate respiratory distress, tachypneic, O2 sat 87% on RA and she has no chronic O2 requirement. O2 sat minimally improved on supplemental oxygen, will start BiPAP, given duonebs, steroids, obtain screening labs and chest x-ray, anticipate admission.  ----------------------------------------- 7:22 PM on 08/14/2015 ----------------------------------------- Patient  with increased air movement after DuoNeb on BiPAP and appears much more comfortable at this time, able to speak over the BiPAP. Her troponin is elevated 0.21, aspirin ordered, no chest pain but this certainly could represent NSTEMI. Her BNP is elevated at 463, she  has no documented history of CHF and given her mild hypotension on arrival, will only give a very small dose of Lasix. Currently her blood pressure is 127/86. I discussed the case with the hospitalist, Dr. Posey Pronto, for admission. ____________________________________________   FINAL CLINICAL IMPRESSION(S) / ED DIAGNOSES  Final diagnoses:  SOB (shortness of breath)  Congestive heart failure, unspecified congestive heart failure chronicity, unspecified congestive heart failure type [facility])  NSTEMI (non-ST elevated myocardial infarction) (HCC)      Joanne Gavel, MD 08/14/15 1926

## 2015-08-14 NOTE — ED Notes (Signed)
Pt c/o being anxious and feels like the mask is causing her to not be able to breath - breathing techniques discussed with pt - pt O2 sat is 95% - Dr Edd Fabian notified of pt anxiousness - order received for Ativan to be given

## 2015-08-14 NOTE — ED Notes (Signed)
Respiratory in room with pt drawing labs - then will call and go to CT

## 2015-08-14 NOTE — ED Notes (Signed)
Pt c/o SOB intermittent over the past couple of weeks, states today the SOB is severe, pt is tachypnic on arrival.. States she is on bipap at night during sleep.. States she has been on prednisone and inhaler without any relief.Marland Kitchen

## 2015-08-15 ENCOUNTER — Encounter
Admission: EM | Disposition: A | Discharge status: Home / Self Care | Payer: Self-pay | Source: Home / Self Care | Attending: Internal Medicine

## 2015-08-15 ENCOUNTER — Observation Stay: Payer: Commercial Managed Care - HMO

## 2015-08-15 ENCOUNTER — Observation Stay (HOSPITAL_COMMUNITY)
Admit: 2015-08-15 | Discharge: 2015-08-15 | Disposition: A | Discharge status: Home / Self Care | Payer: Commercial Managed Care - HMO | Attending: Cardiovascular Disease | Admitting: Cardiovascular Disease

## 2015-08-15 ENCOUNTER — Observation Stay: Admit: 2015-08-15 | Payer: Commercial Managed Care - HMO

## 2015-08-15 DIAGNOSIS — Z79899 Other long term (current) drug therapy: Secondary | ICD-10-CM | POA: Diagnosis not present

## 2015-08-15 DIAGNOSIS — G4733 Obstructive sleep apnea (adult) (pediatric): Secondary | ICD-10-CM | POA: Diagnosis present

## 2015-08-15 DIAGNOSIS — Z885 Allergy status to narcotic agent status: Secondary | ICD-10-CM | POA: Diagnosis not present

## 2015-08-15 DIAGNOSIS — M7122 Synovial cyst of popliteal space [Baker], left knee: Secondary | ICD-10-CM | POA: Diagnosis not present

## 2015-08-15 DIAGNOSIS — E669 Obesity, unspecified: Secondary | ICD-10-CM | POA: Diagnosis present

## 2015-08-15 DIAGNOSIS — R0600 Dyspnea, unspecified: Secondary | ICD-10-CM | POA: Diagnosis not present

## 2015-08-15 DIAGNOSIS — I2699 Other pulmonary embolism without acute cor pulmonale: Secondary | ICD-10-CM | POA: Diagnosis present

## 2015-08-15 DIAGNOSIS — I2609 Other pulmonary embolism with acute cor pulmonale: Secondary | ICD-10-CM | POA: Diagnosis not present

## 2015-08-15 DIAGNOSIS — Z981 Arthrodesis status: Secondary | ICD-10-CM | POA: Diagnosis not present

## 2015-08-15 DIAGNOSIS — Z8249 Family history of ischemic heart disease and other diseases of the circulatory system: Secondary | ICD-10-CM | POA: Diagnosis not present

## 2015-08-15 DIAGNOSIS — E7800 Pure hypercholesterolemia, unspecified: Secondary | ICD-10-CM | POA: Diagnosis present

## 2015-08-15 DIAGNOSIS — R0602 Shortness of breath: Secondary | ICD-10-CM | POA: Diagnosis present

## 2015-08-15 DIAGNOSIS — M1990 Unspecified osteoarthritis, unspecified site: Secondary | ICD-10-CM | POA: Diagnosis present

## 2015-08-15 DIAGNOSIS — I110 Hypertensive heart disease with heart failure: Secondary | ICD-10-CM | POA: Diagnosis present

## 2015-08-15 DIAGNOSIS — I82441 Acute embolism and thrombosis of right tibial vein: Secondary | ICD-10-CM | POA: Diagnosis present

## 2015-08-15 DIAGNOSIS — J9601 Acute respiratory failure with hypoxia: Secondary | ICD-10-CM | POA: Diagnosis present

## 2015-08-15 DIAGNOSIS — C9110 Chronic lymphocytic leukemia of B-cell type not having achieved remission: Secondary | ICD-10-CM | POA: Diagnosis present

## 2015-08-15 DIAGNOSIS — J9600 Acute respiratory failure, unspecified whether with hypoxia or hypercapnia: Secondary | ICD-10-CM | POA: Diagnosis not present

## 2015-08-15 DIAGNOSIS — Z9889 Other specified postprocedural states: Secondary | ICD-10-CM | POA: Diagnosis not present

## 2015-08-15 DIAGNOSIS — K219 Gastro-esophageal reflux disease without esophagitis: Secondary | ICD-10-CM | POA: Diagnosis present

## 2015-08-15 DIAGNOSIS — D472 Monoclonal gammopathy: Secondary | ICD-10-CM | POA: Diagnosis present

## 2015-08-15 DIAGNOSIS — Z7982 Long term (current) use of aspirin: Secondary | ICD-10-CM | POA: Diagnosis not present

## 2015-08-15 DIAGNOSIS — Z6837 Body mass index (BMI) 37.0-37.9, adult: Secondary | ICD-10-CM | POA: Diagnosis not present

## 2015-08-15 DIAGNOSIS — E785 Hyperlipidemia, unspecified: Secondary | ICD-10-CM | POA: Diagnosis present

## 2015-08-15 HISTORY — PX: PERIPHERAL VASCULAR CATHETERIZATION: SHX172C

## 2015-08-15 LAB — BASIC METABOLIC PANEL
ANION GAP: 12 (ref 5–15)
BUN: 22 mg/dL — ABNORMAL HIGH (ref 6–20)
CALCIUM: 9.3 mg/dL (ref 8.9–10.3)
CHLORIDE: 105 mmol/L (ref 101–111)
CO2: 25 mmol/L (ref 22–32)
Creatinine, Ser: 1.03 mg/dL — ABNORMAL HIGH (ref 0.44–1.00)
GFR calc non Af Amer: 53 mL/min — ABNORMAL LOW (ref >60)
Glucose, Bld: 167 mg/dL — ABNORMAL HIGH (ref 65–99)
POTASSIUM: 4.1 mmol/L (ref 3.5–5.1)
Sodium: 142 mmol/L (ref 135–145)

## 2015-08-15 LAB — CBC
HEMATOCRIT: 41.1 % (ref 35.0–47.0)
Hemoglobin: 13.5 g/dL (ref 12.0–16.0)
MCH: 29.8 pg (ref 26.0–34.0)
MCHC: 32.8 g/dL (ref 32.0–36.0)
MCV: 90.6 fL (ref 80.0–100.0)
Platelets: 172 10*3/uL (ref 150–440)
RBC: 4.54 MIL/uL (ref 3.80–5.20)
RDW: 15.8 % — AB (ref 11.5–14.5)
WBC: 19.1 10*3/uL — ABNORMAL HIGH (ref 3.6–11.0)

## 2015-08-15 LAB — ECHOCARDIOGRAM COMPLETE
HEIGHTINCHES: 65 in
Weight: 3584 oz

## 2015-08-15 LAB — HEPARIN LEVEL (UNFRACTIONATED): HEPARIN UNFRACTIONATED: 0.88 [IU]/mL — AB (ref 0.30–0.70)

## 2015-08-15 LAB — GLUCOSE, CAPILLARY: Glucose-Capillary: 146 mg/dL — ABNORMAL HIGH (ref 65–99)

## 2015-08-15 LAB — TROPONIN I: Troponin I: 0.12 ng/mL — ABNORMAL HIGH (ref <0.031)

## 2015-08-15 SURGERY — THROMBECTOMY
Anesthesia: Moderate Sedation

## 2015-08-15 MED ORDER — CHLORHEXIDINE GLUCONATE 0.12 % MT SOLN
15.0000 mL | Freq: Two times a day (BID) | OROMUCOSAL | Status: DC
  Administered 2015-08-15 – 2015-08-16 (×4): 15 mL via OROMUCOSAL
  Filled 2015-08-15 (×3): qty 15

## 2015-08-15 MED ORDER — ALTEPLASE 2 MG IJ SOLR
INTRAMUSCULAR | Status: AC
  Filled 2015-08-15: qty 24

## 2015-08-15 MED ORDER — HEPARIN SODIUM (PORCINE) 1000 UNIT/ML IJ SOLN
INTRAMUSCULAR | Status: AC
  Filled 2015-08-15: qty 1

## 2015-08-15 MED ORDER — HEPARIN (PORCINE) IN NACL 2-0.9 UNIT/ML-% IJ SOLN
INTRAMUSCULAR | Status: AC
  Filled 2015-08-15: qty 1000

## 2015-08-15 MED ORDER — FENTANYL CITRATE (PF) 100 MCG/2ML IJ SOLN
INTRAMUSCULAR | Status: AC
  Filled 2015-08-15: qty 2

## 2015-08-15 MED ORDER — HEPARIN (PORCINE) IN NACL 100-0.45 UNIT/ML-% IJ SOLN
1150.0000 [IU]/h | INTRAMUSCULAR | Status: DC
  Administered 2015-08-15 – 2015-08-16 (×2): 1150 [IU]/h via INTRAVENOUS
  Filled 2015-08-15 (×2): qty 250

## 2015-08-15 MED ORDER — LOSARTAN POTASSIUM-HCTZ 100-12.5 MG PO TABS: 1.0000 | ORAL_TABLET | Freq: Every day | ORAL | Status: DC

## 2015-08-15 MED ORDER — MIDAZOLAM HCL 5 MG/5ML IJ SOLN
INTRAMUSCULAR | Status: AC
  Filled 2015-08-15: qty 5

## 2015-08-15 MED ORDER — FENTANYL CITRATE (PF) 100 MCG/2ML IJ SOLN
INTRAMUSCULAR | Status: DC | PRN
  Administered 2015-08-15: 25 ug via INTRAVENOUS
  Administered 2015-08-15: 50 ug via INTRAVENOUS
  Administered 2015-08-15 (×3): 25 ug via INTRAVENOUS

## 2015-08-15 MED ORDER — PREDNISONE 20 MG PO TABS: 30.0000 mg | ORAL_TABLET | Freq: Every day | ORAL | Status: DC

## 2015-08-15 MED ORDER — CETYLPYRIDINIUM CHLORIDE 0.05 % MT LIQD: 7.0000 mL | Freq: Two times a day (BID) | OROMUCOSAL | Status: DC

## 2015-08-15 MED ORDER — HEPARIN (PORCINE) IN NACL 100-0.45 UNIT/ML-% IJ SOLN
1150.0000 [IU]/h | INTRAMUSCULAR | Status: DC
  Filled 2015-08-15 (×2): qty 250

## 2015-08-15 MED ORDER — IOPAMIDOL (ISOVUE-300) INJECTION 61%
INTRAVENOUS | Status: DC | PRN
  Administered 2015-08-15: 70 mL via INTRA_ARTERIAL

## 2015-08-15 MED ORDER — LIDOCAINE HCL (PF) 1 % IJ SOLN
INTRAMUSCULAR | Status: AC
  Filled 2015-08-15: qty 30

## 2015-08-15 MED ORDER — ZOLPIDEM TARTRATE 5 MG PO TABS
5.0000 mg | ORAL_TABLET | Freq: Every evening | ORAL | Status: DC | PRN
  Administered 2015-08-15: 5 mg via ORAL
  Filled 2015-08-15 (×2): qty 1

## 2015-08-15 MED ORDER — HEPARIN SODIUM (PORCINE) 1000 UNIT/ML IJ SOLN
INTRAMUSCULAR | Status: DC | PRN
  Administered 2015-08-15: 2000 [IU] via INTRAVENOUS

## 2015-08-15 MED ORDER — HYDROCHLOROTHIAZIDE 12.5 MG PO CAPS
12.5000 mg | ORAL_CAPSULE | Freq: Every day | ORAL | Status: DC
  Administered 2015-08-15 – 2015-08-16 (×2): 12.5 mg via ORAL
  Filled 2015-08-15 (×2): qty 1

## 2015-08-15 MED ORDER — MIDAZOLAM HCL 2 MG/2ML IJ SOLN
INTRAMUSCULAR | Status: DC | PRN
  Administered 2015-08-15 (×2): 2 mg via INTRAVENOUS
  Administered 2015-08-15: 1 mg via INTRAVENOUS
  Administered 2015-08-15: 2 mg via INTRAVENOUS
  Administered 2015-08-15: 1 mg via INTRAVENOUS

## 2015-08-15 MED ORDER — LOSARTAN POTASSIUM 50 MG PO TABS
100.0000 mg | ORAL_TABLET | Freq: Every day | ORAL | Status: DC
  Administered 2015-08-15 – 2015-08-16 (×2): 100 mg via ORAL
  Filled 2015-08-15 (×2): qty 2

## 2015-08-15 MED ORDER — PROPRANOLOL HCL 40 MG PO TABS
40.0000 mg | ORAL_TABLET | Freq: Two times a day (BID) | ORAL | Status: DC
  Administered 2015-08-15: 40 mg via ORAL
  Filled 2015-08-15 (×3): qty 1

## 2015-08-15 MED ORDER — ASPIRIN EC 81 MG PO TBEC
81.0000 mg | DELAYED_RELEASE_TABLET | Freq: Every day | ORAL | Status: DC
  Administered 2015-08-16: 81 mg via ORAL
  Filled 2015-08-15: qty 1

## 2015-08-15 MED ORDER — FAMOTIDINE 20 MG PO TABS
20.0000 mg | ORAL_TABLET | Freq: Two times a day (BID) | ORAL | Status: DC
  Administered 2015-08-15 – 2015-08-16 (×2): 20 mg via ORAL
  Filled 2015-08-15 (×2): qty 1

## 2015-08-15 SURGICAL SUPPLY — 15 items
CATH ANGIO 5F 100CM .035 PIG (CATHETERS) ×2 IMPLANT
CATH G 5FX100 (CATHETERS) ×2 IMPLANT
CATH INFINITI 5 FR MPA2 (CATHETERS) ×4 IMPLANT
CATH INFINITI 5FR ANG PIGTAIL (CATHETERS) ×2 IMPLANT
CATH LANGSTON DUAL LUM PIG 6FR (CATHETERS) ×2 IMPLANT
GUIDEWIRE ANGLED .035X260CM (WIRE) ×2 IMPLANT
PACK ANGIOGRAPHY (CUSTOM PROCEDURE TRAY) ×2 IMPLANT
SET INTRO CAPELLA COAXIAL (SET/KITS/TRAYS/PACK) ×2 IMPLANT
SHEATH BRITE TIP 5FRX11 (SHEATH) ×2 IMPLANT
SHEATH RAABE 8FRX55 (SHEATH) ×2 IMPLANT
SYR MEDRAD MARK V 150ML (SYRINGE) ×2 IMPLANT
TUBING CONTRAST HIGH PRESS 72 (TUBING) ×2 IMPLANT
WIRE J 3MM .035X145CM (WIRE) ×2 IMPLANT
WIRE MAGIC TORQUE 260C (WIRE) ×2 IMPLANT
WIRE ROSEN-J .035X260CM (WIRE) ×2 IMPLANT

## 2015-08-15 NOTE — Progress Notes (Signed)
Pharmacist - Prescriber Communication  Per [place], the order for diphenhydramine 50 mg po at bedtime PRN sleep has been changed to zolpidem 5 mg for patient over 74 years old. Please call pharmacy if you have any questions about this substitution.  Laural Benes, Pharm.D., BCPS Clinical Pharmacist 08/15/2015 [phone]

## 2015-08-15 NOTE — Progress Notes (Signed)
ANTICOAGULATION CONSULT NOTE - Follow Up Consult  Pharmacy Consult for heparin drip Indication: pulmonary embolus and DVT  Allergies  Allergen Reactions  . Oxycodone Other (See Comments)    Reaction:  Hallucinations  . Morphine Other (See Comments)    Reaction:  Hallucinations    Patient Measurements: Height: 5\' 5"  (165.1 cm) Weight: 224 lb (101.606 kg) IBW/kg (Calculated) : 57 Heparin Dosing Weight: 80.4 kg  Vital Signs: Temp: 97.3 F (36.3 C) (04/25 1235) Temp Source: Oral (04/25 1235) BP: 165/67 mmHg (04/25 1800) Pulse Rate: 55 (04/25 1800)  Labs:  Recent Labs  08/14/15 1753 08/14/15 2107 08/14/15 2218 08/15/15 0339 08/15/15 0741  HGB 13.4 13.5  --  13.5  --   HCT 41.7 41.5  --  41.1  --   PLT 176 155  --  172  --   APTT  --   --  30  --   --   LABPROT  --   --  14.8  --   --   INR  --   --  1.14  --   --   HEPARINUNFRC  --   --   --   --  0.88*  CREATININE 0.98 1.11*  --  1.03*  --   TROPONINI 0.21* 0.16*  --  0.12*  --    Estimated Creatinine Clearance: 57.4 mL/min (by C-G formula based on Cr of 1.03).  Medications:  Scheduled:  . antiseptic oral rinse  7 mL Mouth Rinse q12n4p  . [START ON 08/16/2015] aspirin EC  81 mg Oral Daily  . chlorhexidine  15 mL Mouth Rinse BID  . escitalopram  10 mg Oral Daily  . famotidine  20 mg Oral BID  . gabapentin  300 mg Oral TID  . losartan  100 mg Oral Daily   And  . hydrochlorothiazide  12.5 mg Oral Daily  . ipratropium-albuterol  3 mL Nebulization Q4H  . multivitamin with minerals  1 tablet Oral Daily  . multivitamin-lutein  1 capsule Oral BID  . pravastatin  40 mg Oral Daily  . propranolol  40 mg Oral BID  . sodium chloride flush  3 mL Intravenous Q12H   Infusions:  . heparin      Assessment: Pharmacy monitoring and dosing heparin drip in this 75 year old female with PE and right calf DVT. Patient was not taking anticoagulants prior to admission.   Goal of Therapy:  Heparin level 0.3-0.7  units/ml Monitor platelets by anticoagulation protocol: Yes   Plan:  Heparin resumed after vascular procedure at previous rate of 1150 units/hr without bolus as per vascular orders. Will check a HL 8 hours after resuming heparin drip.   Napoleon Form, PharmD Clinical Pharmacist 08/15/2015,6:51 PM

## 2015-08-15 NOTE — Progress Notes (Signed)
Patient states she wears cpap at home. Has been using bipap with this admission. Patient c/o unit being too noisy in bipap mode when unit switches between inspiratory and exhalation.  I changed mode to cpap..placed on 10 since bipap setting inhalation was such. Patient did not know what her cpap setting is on her home unit. Patient tolerating well. Will conitnue to monitor

## 2015-08-15 NOTE — Consult Note (Signed)
Name: Jamie Phillips MRN: [id number] DOB: [birth year]    ADMISSION DATE:  08/14/2015 CONSULTATION DATE: 08/15/14  REFERRING MD :  EDP  CHIEF COMPLAINT:  Acute shortness of breath  BRIEF PATIENT DESCRIPTION: Jamie Phillips is a 75 years old female with a history of hypertension ,GERD cancer, hyperlipidemia ,depression ,obesity, anxiety, sleep apnea presenting to the ED with pulmonary embolism .  SIGNIFICANT EVENTS  None  STUDIES:  4/24>> CT angiographically was concerning for acute and subacute PE with right heart strain   HISTORY OF PRESENT ILLNESS:  Jamie Phillips is a 75 years old female with a known history of sleep apnea ,hypertension ,GERD ,cancer(CLL) and hyperlipidemia.  she presents to the ED on 4/24 with acute shortness of breath, tachypneic. She denies any chest pain, no coughing up of blood , no vomiting ,no diarrhea , denies any recent hospitalization, fevers, chills . Her troponins where mildly elevated on arrival .patient was placed on nasal cannula . CT angiographically was positive for acute and subacute pulmonary emboli with evidence of right heart strain .  Therefore PCCM team was consulted on 4/25 for further management.   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  has past surgical history that includes neck fusion; Breast biopsy (Left, 08/19/04); and Wrist surgery (Left, 2000). Prior to Admission medications   Medication Sig Start Date End Date Taking? Authorizing Provider  albuterol (PROVENTIL HFA;VENTOLIN HFA) 108 (90 Base) MCG/ACT inhaler Inhale 2 puffs into the lungs every 6 (six) hours as needed for wheezing or shortness of breath.    Yes Historical Provider, MD  aspirin 325 MG tablet Take 325 mg by mouth daily.   Yes Historical Provider, MD  escitalopram (LEXAPRO) 10 MG tablet Take 10 mg by mouth daily.   Yes Historical Provider, MD  gabapentin (NEURONTIN) 300 MG capsule Take 300 mg by mouth 3 (three) times daily.   Yes Historical Provider, MD  HYDROcodone-acetaminophen (NORCO/VICODIN) 5-325 MG tablet Take 1 tablet by mouth every 6 (six) hours as needed for moderate pain.    Yes Historical Provider, MD  losartan-hydrochlorothiazide (HYZAAR) 100-12.5 MG tablet Take 1 tablet by mouth daily.   Yes Historical Provider, MD  Multiple Vitamin (MULTIVITAMIN WITH MINERALS) TABS tablet Take 1 tablet by mouth daily.   Yes Historical Provider, MD  Multiple Vitamins-Minerals (PRESERVISION AREDS 2 PO) Take 1 capsule by mouth 2 (two) times daily.    Yes Historical Provider, MD  pantoprazole (PROTONIX) 40 MG tablet Take 40 mg by mouth daily.   Yes Historical Provider, MD  pravastatin (PRAVACHOL) 40 MG tablet Take 40 mg by mouth daily.    Yes Historical Provider, MD  propranolol (INDERAL) 40 MG tablet Take 40 mg by mouth 2 (two) times daily.    Yes Historical Provider, MD   Allergies  Allergen Reactions  . Oxycodone Other (See Comments)    Reaction:  Hallucinations  . Morphine Other (See Comments)    Reaction:  Hallucinations    FAMILY HISTORY:  family history includes Congestive Heart Failure in her father; Heart failure in her father; Pulmonary embolism in her mother. SOCIAL HISTORY:  reports that she has never smoked. She has never used smokeless tobacco. She reports that she does not drink alcohol or use illicit drugs.  REVIEW OF SYSTEMS:  Constitutional: Negative for fever, chills, weight loss, malaise/fatigue and diaphoresis.  HENT: Negative for hearing loss, ear pain, nosebleeds, congestion, sore throat, neck pain, tinnitus and ear discharge.   Eyes: Negative for blurred vision, double vision, photophobia, pain, discharge and  redness.  Respiratory: Negative for cough, hemoptysis, sputum production, shortness of breath, wheezing and stridor.   Cardiovascular: Negative for chest pain, palpitations, orthopnea, claudication, leg swelling and PND.  Gastrointestinal: Negative for heartburn, nausea, vomiting, abdominal pain, diarrhea, constipation, blood in stool and melena.  Genitourinary: Negative for dysuria, urgency, frequency, hematuria and flank pain.  Musculoskeletal: Negative for myalgias, back pain, joint pain and falls.  Skin: Negative for itching and rash.  Neurological: Negative for dizziness, tingling, tremors, sensory change, speech change, focal weakness, seizures, loss of consciousness, weakness and headaches.  Endo/Heme/Allergies: Negative for environmental allergies and polydipsia. Does not bruise/bleed easily.  SUBJECTIVE: Patient states that she feels much better and not in any acute distress  VITAL SIGNS: Temp:  [97.5 F (36.4 C)-98.5 F (36.9 C)] 97.5 F (36.4 C) (04/25 0800) Pulse Rate:  [56-102] 58 (04/25 0828) Resp:  [11-35] 11 (04/25 0828) BP: (94-164)/(63-101) 160/82 mmHg (04/25 0800) SpO2:  [87 %-100 %] 100 % (04/25 0828) FiO2 (%):  [40 %] 40 % (04/25 0025) Weight:  [101.606 kg (224 lb)] 101.606 kg (224 lb) (04/24 1745)  PHYSICAL EXAMINATION: General:  Elderly white female, found on the bed in no acute distress  Neuro:  Awake, alert, oriented,, follows commands  HEENT: Atraumatic, normocephalic, no discharge  Cardiovascular: S1 and S2 , regular rate and rhythm .no murmur or gallop noted  Lungs:  Diminished on the right but otherwise clear  Abdomen:  Soft, nontender, good bowel sounds  Musculoskeletal:No inflammation or deformity noted  Skin:  Grossly intact   Recent Labs Lab 08/14/15 1753 08/14/15 2107 08/15/15 0339  NA 144  --  142  K 4.3  --  4.1  CL 107  --  105  CO2 28  --  25  BUN 20  --  22*  CREATININE 0.98 1.11* 1.03*  GLUCOSE 159*  --  167*    Recent Labs Lab  08/14/15 1753 08/14/15 2107 08/15/15 0339  HGB 13.4 13.5 13.5  HCT 41.7 41.5 41.1  WBC 20.3* 18.5* 19.1*  PLT 176 155 172   Ct Angio Chest Pe W/cm &/or Wo Cm  08/14/2015  CLINICAL DATA:  Shortness of breath for the past couple of weeks, severe today. EXAM: CT ANGIOGRAPHY CHEST WITH CONTRAST TECHNIQUE: Multidetector CT imaging of the chest was performed using the standard protocol during bolus administration of intravenous contrast. Multiplanar CT image reconstructions and MIPs were obtained to evaluate the vascular anatomy. CONTRAST:  75 cc Isovue 370 intravenous COMPARISON:  02/25/2011 FINDINGS: THORACIC INLET/BODY WALL: No acute abnormality. MEDIASTINUM: There is central/acute pulmonary embolism in all lobes with bilateral lobar clot and multiple occlusive segmental and subsegmental emboli. Some clot has a peripheral marginated appearance suggesting more subacute timing. Dilated right ventricle compared to the left with a ratio of 1.2. No pericardial effusion. The presence of right heart strain has been associated with an increased risk of morbidity and mortality. Atherosclerosis, including the left coronary circulation. LUNG WINDOWS: No lung infarct or pulmonary edema Right costophrenic sulcus nodule is partly calcified and likely chronic/ stable when allowing for motion on the previous study. 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 UPPER ABDOMEN: No acute findings. OSSEOUS: No acute finding. Critical Value/emergent results were called by telephone at the time of interpretation  on 08/14/2015 at 10:06 pm to Dr. Jannifer Franklin, who verbally acknowledged these results. Review of the MIP images confirms the above findings. IMPRESSION: Positive for acute and subacute pulmonary emboli with evidence of right heart strain (RV/LV Ratio = 1.2) consistent with at least submassive (intermediate risk) PE. Please activate Code PE by paging [phone]. Electronically Signed   By: Monte Fantasia M.D.   On: 08/14/2015 22:08    Dg Chest Portable 1 View  08/14/2015  CLINICAL DATA:  Short of breath EXAM: PORTABLE CHEST 1 VIEW COMPARISON:  05/11/2015 FINDINGS: Mild cardiomegaly. Normal vascularity. Lungs clear. No pneumothorax. No pleural effusion. IMPRESSION: No active disease. Electronically Signed   By: Marybelle Killings M.D.   On: 08/14/2015 18:31    ASSESSMENT / PLAN:  Pulmonary  Embolism and Hx of sleep apnea  Plan  -Continue heparin gtt>> if clinically stable then can transition to oral agent on 4/27   -Clinical status reasonably stable.>> No indication for systemic or catheter direct at thrombolytics -Doppler legs  -Home CPAP as needed     [facility]   Patient seen and examined with the nurse practitioner, I was present for the history, physical and formulated the assessment and plan. The patient was doing better, the lungs were clear to auscultation, the patient's heparin was therapeutic. The case was discussed with vascular surgery in regards to catheter directed thrombolytics, who will assess the patient. Following their assessment. The patient can be transitioned to oral anticoagulation for 3-6 months. Lower extremity Dopplers are pending.  Marda Stalker M.D. Critical Care Attestation.  I have personally obtained a history, examined the patient, evaluated laboratory and imaging results, formulated the assessment and plan and placed orders. The Patient requires high complexity decision making for assessment and support, frequent evaluation and titration of therapies, application of advanced monitoring technologies and extensive interpretation of multiple databases. The patient has critical illness that could lead imminently to failure of 1 or more organ systems and requires the highest level of physician preparedness to intervene.  Critical Care Time devoted to patient care services described in this note is 35 minutes and is exclusive of time spent in  procedures.

## 2015-08-15 NOTE — Op Note (Signed)
[facility]  Percutaneous Study/Intervention Procedural Note   Date of Surgery: 08/15/2015,5:52 PM  Surgeon:Jihaad Bruschi, Dolores Lory   Pre-operative Diagnosis: Submassive bilateral pulmonary emboli with right heart strain; hypoxia; DVT  Post-operative diagnosis:  Same  Procedure(s) Performed:  1.  Introduction catheter into left pulmonary artery  2.  Infusion thrombolysis  3.  Pulmonary angiogram   Anesthesia: Conscious sedation was administered under my direct supervision. IV Versed plus fentanyl were utilized. Continuous ECG, pulse oximetry and blood pressure was monitored throughout the entire procedure. A total of 8 milligrams of Versed and 150 micrograms of fentanyl were utilized.  Conscious sedation was administered for a total of 90 minutes.  Sheath: 8 French right common femoral vein  Contrast: 70 cc   Fluoroscopy Time: 32.3 minutes  Indications:  Patient presented yesterday with the abrupt onset of shortness of breath and hypoxemia. Workup has demonstrated submassive bilateral pulmonary emboli with severe right heart strain. Risks and benefits for thrombolysis were reviewed with the patient and family all questions were answered alternative therapies were discussed. The patient wishes to proceed with angiography and intervention.  Procedure:  Jamie Phillips a 76 y.o. female who was identified and appropriate procedural time out was performed.  The patient was then placed supine on the table and prepped and draped in the usual sterile fashion.  Ultrasound was used to evaluate the right common femoral vein.  It was patent .  A digital ultrasound image was acquired.  Amicropuncture needle was used to access the right common femoral vein under direct ultrasound guidance and a permanent image wassaved for the record.microwire was then advanced under fluoroscopic guidance followed by micro-sheath.  A 0.035 J wire was advanced without resistance and a 5Fr sheath was  placed.    Angled pigtail catheter was then negotiated into the right atrium and subsequently into the ventricle. However given the rather massive dilatation of the right ventricle negotiating the wire into the pulmonary artery was fairly difficult. After several different attempts I elected to position the catheter in the right ventricle and infuse 24 mg of TPA over several minutes. This initiated the thrombolysis and while I continued to work toward getting the catheter into the pulmonary vascular bed the TPA was dwelling. Subsequently using a variety different catheters the the 145 pigtail catheter was negotiated over the wire and into the main pulmonary artery. Bolus injection of contrast was then utilized to demonstrate the main trunk and the right and left main pulmonary arteries. At this point they appeared free of thrombus. The catheter was then negotiated into the left distal pulmonary artery and hand injection contrast demonstrated minimal residual thrombus. There is very little flow limitation noted. Further attempts at negotiating the catheter into the right side were unsuccessful and given the success on the left side I did not feel the persisting in my attempts to select the right would add any advantage and I elected to terminate the case.  Sheath was pulled and pressure was held and a safeguard was applied.  Findings:   Initial image demonstrates both the right atrium and right ventricle are markedly enlarged consistent with the heart strain noted on the previous CT scan. The main pulmonary artery is widely patent as is the right and left pulmonary arteries. Imaging of the distal pulmonary vascular demonstrates significant improvement with minimal thrombus identified    Disposition: Patient was taken to the recovery room in stable condition having tolerated the procedure well.  Jamie Phillips, Dolores Lory 08/15/2015,5:52  PM

## 2015-08-15 NOTE — Progress Notes (Signed)
Hospitalist notified of labs inability to obtain lab stick. Lab techs attempted x 3 orders received to hold labs and reevaluate 4/26 during rounds. Will continue to assess for changes.

## 2015-08-15 NOTE — Progress Notes (Signed)
*  PRELIMINARY RESULTS* Echocardiogram 2D Echocardiogram has been performed.  Jamie Phillips 08/15/2015, 11:54 AM

## 2015-08-15 NOTE — Progress Notes (Signed)
[place] [place] NAME: Jamie Phillips    MR#:  [id number]  DATE OF BIRTH:  April 04, 1941  SUBJECTIVE:   Patient is doing better this morning. Shortness of breath has improved.  REVIEW OF SYSTEMS:    Review of Systems  Constitutional: Negative for fever, chills and malaise/fatigue.  HENT: Negative for ear discharge, ear pain, hearing loss, nosebleeds and sore throat.   Eyes: Negative for blurred vision and pain.  Respiratory: Positive for shortness of breath. Negative for cough, hemoptysis and wheezing.   Cardiovascular: Negative for chest pain, palpitations and leg swelling.  Gastrointestinal: Negative for nausea, vomiting, abdominal pain, diarrhea and blood in stool.  Genitourinary: Negative for dysuria.  Musculoskeletal: Negative for back pain.  Neurological: Negative for dizziness, tremors, speech change, focal weakness, seizures and headaches.  Endo/Heme/Allergies: Does not bruise/bleed easily.  Psychiatric/Behavioral: Negative for depression, suicidal ideas and hallucinations.    Tolerating Diet:yes      DRUG ALLERGIES:   Allergies  Allergen Reactions  . Oxycodone Other (See Comments)    Reaction:  Hallucinations  . Morphine Other (See Comments)    Reaction:  Hallucinations    VITALS:  Blood pressure 155/132, pulse 69, temperature 97.5 F (36.4 C), temperature source Oral, resp. rate 25, height 5\' 5"  (1.651 m), weight 101.606 kg (224 lb), SpO2 100 %.  PHYSICAL EXAMINATION:   Physical Exam  Constitutional: She is oriented to person, place, and time and well-developed, well-nourished, and in no distress. No distress.  HENT:  Head: Normocephalic.  Eyes: No scleral icterus.  Neck: Normal range of motion. Neck supple. No JVD present. No tracheal deviation present.  Cardiovascular: Normal rate, regular rhythm and normal heart sounds.  Exam reveals no gallop and no friction rub.   No murmur heard. Pulmonary/Chest: Effort  normal and breath sounds normal. No respiratory distress. She has no wheezes. She has no rales. She exhibits no tenderness.  Abdominal: Soft. Bowel sounds are normal. She exhibits no distension and no mass. There is no tenderness. There is no rebound and no guarding.  Musculoskeletal: Normal range of motion. She exhibits no edema.  Neurological: She is alert and oriented to person, place, and time.  Skin: Skin is warm. No rash noted. No erythema.  Psychiatric: Affect and judgment normal.      LABORATORY PANEL:   CBC  Recent Labs Lab 08/15/15 0339  WBC 19.1*  HGB 13.5  HCT 41.1  PLT 172   ------------------------------------------------------------------------------------------------------------------  Chemistries   Recent Labs Lab 08/14/15 1753  08/15/15 0339  NA 144  --  142  K 4.3  --  4.1  CL 107  --  105  CO2 28  --  25  GLUCOSE 159*  --  167*  BUN 20  --  22*  CREATININE 0.98  < > 1.03*  CALCIUM 9.1  --  9.3  AST 24  --   --   ALT 19  --   --   ALKPHOS 91  --   --   BILITOT 0.4  --   --   < > = values in this interval not displayed. ------------------------------------------------------------------------------------------------------------------  Cardiac Enzymes  Recent Labs Lab 08/14/15 1753 08/14/15 2107 08/15/15 0339  TROPONINI 0.21* 0.16* 0.12*   ------------------------------------------------------------------------------------------------------------------  RADIOLOGY:  Ct Angio Chest Pe W/cm &/or Wo Cm  08/14/2015  CLINICAL DATA:  Shortness of breath for the past couple of weeks, severe today. EXAM: CT ANGIOGRAPHY CHEST WITH CONTRAST TECHNIQUE: Multidetector CT imaging  of the chest was performed using the standard protocol during bolus administration of intravenous contrast. Multiplanar CT image reconstructions and MIPs were obtained to evaluate the vascular anatomy. CONTRAST:  75 cc Isovue 370 intravenous COMPARISON:  02/25/2011 FINDINGS:  THORACIC INLET/BODY WALL: No acute abnormality. MEDIASTINUM: There is central/acute pulmonary embolism in all lobes with bilateral lobar clot and multiple occlusive segmental and subsegmental emboli. Some clot has a peripheral marginated appearance suggesting more subacute timing. Dilated right ventricle compared to the left with a ratio of 1.2. No pericardial effusion. The presence of right heart strain has been associated with an increased risk of morbidity and mortality. Atherosclerosis, including the left coronary circulation. LUNG WINDOWS: No lung infarct or pulmonary edema Right costophrenic sulcus nodule is partly calcified and likely chronic/ stable when allowing for motion on the previous study. 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 UPPER ABDOMEN: No acute findings. OSSEOUS: No acute finding. Critical Value/emergent results were called by telephone at the time of interpretation on 08/14/2015 at 10:06 pm to Dr. Jannifer Franklin, who verbally acknowledged these results. Review of the MIP images confirms the above findings. IMPRESSION: Positive for acute and subacute pulmonary emboli with evidence of right heart strain (RV/LV Ratio = 1.2) consistent with at least submassive (intermediate risk) PE. Please activate Code PE by paging [phone]. Electronically Signed   By: Monte Fantasia M.D.   On: 08/14/2015 22:08   US Venous Img Lower Bilateral  08/15/2015  CLINICAL DATA:  Shortness of breath. Bilateral pain and edema left greater than right. Positive PE on chest CTA. History of CLL. EXAM: BILATERAL LOWER EXTREMITY VENOUS DOPPLER ULTRASOUND TECHNIQUE: Gray-scale sonography with compression, as well as color and duplex ultrasound, were performed to evaluate the deep venous system from the level of the common femoral vein through the popliteal and proximal calf veins. COMPARISON:  09/16/2002 by report only FINDINGS: On the left, Normal compressibility of the common femoral, superficial femoral, and popliteal veins, as  well as the proximal calf veins. No filling defects to suggest DVT on grayscale or color Doppler imaging. Doppler waveforms show normal direction of venous flow, normal respiratory phasicity and response to augmentation. There is an elongated 9 x 42 x 63 mm cyst in the posterior popliteal fossa. On the right, thrombosis of a posterior tibial vein, which is noncompressible. Limited visualization of peroneal veins. Normal compressibility of the common femoral, superficial femoral, and popliteal veins. IMPRESSION: 1. Isolated right calf (posterior tibial) DVT. 2. Negative for left lower extremity DVT. 3. Moderate left  Baker's cyst. Electronically Signed   By: Lucrezia Europe M.D.   On: 08/15/2015 10:43   Dg Chest Portable 1 View  08/14/2015  CLINICAL DATA:  Short of breath EXAM: PORTABLE CHEST 1 VIEW COMPARISON:  05/11/2015 FINDINGS: Mild cardiomegaly. Normal vascularity. Lungs clear. No pneumothorax. No pleural effusion. IMPRESSION: No active disease. Electronically Signed   By: Marybelle Killings M.D.   On: 08/14/2015 18:31     ASSESSMENT AND PLAN:   75 year old female with a history of sleep apnea, essential hypertension and CLL who presented with shortness of breath and found to have submassive pulmonary emboli.  1. Acute hypoxic respiratory failure in the setting of submassive pulmonary emboli with right heart strain: Continue heparin drip Order hypercoagulable panel. Risk factor may be CLL. Follow up on ECHO Follow up on VASCULAR Consult  2. Elevated troponin: This is due to submassive pulmonary emboli and right heart strain and not ACS.  3. Essential hypertension: Continue losartan/HCTZ and propanolol.  4.  Hyperlipidemia: Continue pravastatin.    Management plans discussed with the patient and she is in agreement.  CODE STATUS: FULL  TOTAL TIME TAKING CARE OF THIS PATIENT: 34 minutes.     POSSIBLE D/C 4-5 days, DEPENDING ON CLINICAL CONDITION.   Shaquila Sigman M.D on 08/15/2015 at 12:21  PM  Between 7am to 6pm - Pager - [phone] After 6pm go to [email or web address] - password [email or web address] [name]  Office  [phone]  CC: Primary care physician; Kirk Ruths., MD  Note: This dictation was prepared with Dragon dictation along with smaller phrase technology. Any transcriptional errors that result from this process are unintentional.

## 2015-08-15 NOTE — Progress Notes (Signed)
ANTICOAGULATION CONSULT NOTE - Follow Up Consult  Pharmacy Consult for heparin drip Indication: pulmonary embolus and DVT  Allergies  Allergen Reactions  . Oxycodone Other (See Comments)    Reaction:  Hallucinations  . Morphine Other (See Comments)    Reaction:  Hallucinations    Patient Measurements: Height: 5\' 5"  (165.1 cm) Weight: 224 lb (101.606 kg) IBW/kg (Calculated) : 57 Heparin Dosing Weight: 80.4 kg  Vital Signs: Temp: 97.5 F (36.4 C) (04/25 0800) Temp Source: Oral (04/25 0800) BP: 155/132 mmHg (04/25 1100) Pulse Rate: 69 (04/25 1100)  Labs:  Recent Labs  08/14/15 1753 08/14/15 2107 08/14/15 2218 08/15/15 0339 08/15/15 0741  HGB 13.4 13.5  --  13.5  --   HCT 41.7 41.5  --  41.1  --   PLT 176 155  --  172  --   APTT  --   --  30  --   --   LABPROT  --   --  14.8  --   --   INR  --   --  1.14  --   --   HEPARINUNFRC  --   --   --   --  0.88*  CREATININE 0.98 1.11*  --  1.03*  --   TROPONINI 0.21* 0.16*  --  0.12*  --    Estimated Creatinine Clearance: 57.4 mL/min (by C-G formula based on Cr of 1.03).  Medications:  Scheduled:  . antiseptic oral rinse  7 mL Mouth Rinse q12n4p  . [START ON 08/16/2015] aspirin EC  81 mg Oral Daily  . chlorhexidine  15 mL Mouth Rinse BID  . escitalopram  10 mg Oral Daily  . famotidine  20 mg Oral BID  . gabapentin  300 mg Oral TID  . ipratropium-albuterol  3 mL Nebulization Q4H  . losartan-hydrochlorothiazide  1 tablet Oral Daily  . multivitamin with minerals  1 tablet Oral Daily  . multivitamin-lutein  1 capsule Oral BID  . pravastatin  40 mg Oral Daily  . [START ON 08/16/2015] predniSONE  30 mg Oral Q breakfast  . propranolol  40 mg Oral BID  . sodium chloride flush  3 mL Intravenous Q12H   Infusions:  . heparin 1,150 Units/hr (08/15/15 0900)    Assessment: Pharmacy monitoring and dosing heparin drip in this 75 year old female with PE and right calf DVT. Patient was not taking anticoagulants prior to admission.    Heparin level drawn this morning was supratherapeutic at 0.88 at an infusion rate of 1350 units/hr (~16.5 units/kg/hr).  Goal of Therapy:  Heparin level 0.3-0.7 units/ml Monitor platelets by anticoagulation protocol: Yes   Plan:  Decreased heparin infusion rate to 1150 units/hr (~14.5 units/kg/hr) per protocol. Heparin level ordered for 1700 this evening (8 hours from dose change).  CBC ordered with AM labs tomorrow. Thank you for allowing pharmacy to be part of this patient's care.  Lenis Noon, PharmD Clinical Pharmacist 08/15/2015,12:24 PM

## 2015-08-16 ENCOUNTER — Inpatient Hospital Stay: Admit: 2015-08-16 | Payer: Commercial Managed Care - HMO

## 2015-08-16 ENCOUNTER — Encounter: Payer: Self-pay | Admitting: Vascular Surgery

## 2015-08-16 DIAGNOSIS — J9601 Acute respiratory failure with hypoxia: Secondary | ICD-10-CM

## 2015-08-16 DIAGNOSIS — I2609 Other pulmonary embolism with acute cor pulmonale: Secondary | ICD-10-CM

## 2015-08-16 LAB — CBC
HEMATOCRIT: 37.8 % (ref 35.0–47.0)
Hemoglobin: 12.3 g/dL (ref 12.0–16.0)
MCH: 29.6 pg (ref 26.0–34.0)
MCHC: 32.6 g/dL (ref 32.0–36.0)
MCV: 90.9 fL (ref 80.0–100.0)
Platelets: 163 10*3/uL (ref 150–440)
RBC: 4.16 MIL/uL (ref 3.80–5.20)
RDW: 16.2 % — AB (ref 11.5–14.5)
WBC: 31.6 10*3/uL — ABNORMAL HIGH (ref 3.6–11.0)

## 2015-08-16 LAB — HEPARIN LEVEL (UNFRACTIONATED): Heparin Unfractionated: 0.58 IU/mL (ref 0.30–0.70)

## 2015-08-16 MED ORDER — APIXABAN 5 MG PO TABS: 5.0000 mg | ORAL_TABLET | Freq: Two times a day (BID) | ORAL | Status: DC

## 2015-08-16 MED ORDER — IPRATROPIUM-ALBUTEROL 0.5-2.5 (3) MG/3ML IN SOLN: 3.0000 mL | RESPIRATORY_TRACT | Status: DC | PRN

## 2015-08-16 MED ORDER — APIXABAN 5 MG PO TABS
10.0000 mg | ORAL_TABLET | Freq: Two times a day (BID) | ORAL | Status: DC
  Administered 2015-08-16: 10 mg via ORAL
  Filled 2015-08-16: qty 2

## 2015-08-16 NOTE — Care Management (Signed)
Observed patient ambulating independently with nursing.  She maintained room air sats of at least 93%. Gait steady.  Patient without complaints but respiration do appear slightly tachypneic  Discharge order present but informed by nursing this is dependent on respiratory status.  Provided patient with 30 day coupon for Eliquis.  She does have a medicare d coverage plan.

## 2015-08-16 NOTE — Progress Notes (Signed)
Pt found on floor beside commode by Chaplain, Percival Spanish. Pt stated she ambulated to commode independently and slid off of commode onto floor. Pt denies any pain, injury or hitting head. MD, Mody notified and states to continue with discharge plans at this time.

## 2015-08-16 NOTE — Discharge Summary (Signed)
[facility] NAME: Jamie Phillips    MR#:  [id number]  DATE OF BIRTH:  [birth year]  DATE OF ADMISSION:  08/14/2015 ADMITTING PHYSICIAN: Dustin Flock, MD  DATE OF DISCHARGE: 08/16/2015  PRIMARY CARE PHYSICIAN: Kirk Ruths., MD    ADMISSION DIAGNOSIS:  SOB (shortness of breath) [R06.02] NSTEMI (non-ST elevated myocardial infarction) (HCC) [I21.4] Congestive heart failure, unspecified congestive heart failure chronicity, unspecified congestive heart failure type ([place]) [I50.9]  DISCHARGE DIAGNOSIS:  Active Problems:   Acute respiratory failure ([place])   SECONDARY DIAGNOSIS:   Past Medical History  Diagnosis Date  . Hypertension   . GERD (gastroesophageal reflux disease)   . Hypercholesteremia   . Cancer ([place])     cll  . CLL (chronic lymphocytic leukemia) ([place])   . MGUS (monoclonal gammopathy of unknown significance)   . Hyperlipidemia   . Arthritis   . Hemorrhoids   . Depression   . Obesity   . Anxiety   . Back pain   . Low grade B cell lymphoproliferative disorder [facility])     HOSPITAL COURSE:   75 year old female with a history of sleep apnea, essential hypertension and CLL who presented with shortness of breath and found to have submassive pulmonary emboli.  1. Acute hypoxic respiratory failure in the setting of submassive pulmonary emboli with right heart strainAnd right calf DVT: She was initially placed on heparin drip. Vascular surgery was consulted. She underwent thrombolyzed this on 08/15/2015. She has done quite well with this. She is no longer requiring oxygen. Heparin was transitioned to oral apixaban. She will need this for at least 1 year possibly lifelong. She also has a DVT in the right calf. She'll follow up with vascular surgery in 10 days. She'll also follow-up with her oncologist. I'm suspecting that diagnosis of CLL may be one of her risk factors for PE/DVT.  2. Elevated troponin: This is due to  submassive pulmonary emboli and right heart strain and not ACS.  3. Essential hypertension: Continue losartan/HCTZ and propanolol at discharge.  4. Hyperlipidemia: Continue pravastatin at discharge. 5. CLL: White blood cell count is elevated discharge. This is due to CLL. We will have her follow-up with her oncologist within the next 3-5 days.  DISCHARGE CONDITIONS AND DIET:   Patient is stable for discharge on a heart healthy diet.  CONSULTS OBTAINED:  Treatment Team:  Laverle Hobby, MD Algernon Huxley, MD  DRUG ALLERGIES:   Allergies  Allergen Reactions  . Oxycodone Other (See Comments)    Reaction:  Hallucinations  . Morphine Other (See Comments)    Reaction:  Hallucinations    DISCHARGE MEDICATIONS:   Current Discharge Medication List    START taking these medications   Details  apixaban (ELIQUIS) 5 MG TABS tablet Take 1 tablet (5 mg total) by mouth 2 (two) times daily. Take 10 mg Twice a day for 7 days (first dose given in hospital) Then 5 mg twice a day Qty: 80 tablet, Refills: 0      CONTINUE these medications which have NOT CHANGED   Details  albuterol (PROVENTIL HFA;VENTOLIN HFA) 108 (90 Base) MCG/ACT inhaler Inhale 2 puffs into the lungs every 6 (six) hours as needed for wheezing or shortness of breath.    escitalopram (LEXAPRO) 10 MG tablet Take 10 mg by mouth daily.    gabapentin (NEURONTIN) 300 MG capsule Take 300 mg by mouth 3 (three) times daily.    HYDROcodone-acetaminophen (NORCO/VICODIN) 5-325 MG tablet Take 1  tablet by mouth every 6 (six) hours as needed for moderate pain.    Associated Diagnoses: MGUS (monoclonal gammopathy of unknown significance); 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)    losartan-hydrochlorothiazide (HYZAAR) 100-12.5 MG tablet Take 1 tablet by mouth daily.    Multiple Vitamin (MULTIVITAMIN WITH MINERALS) TABS tablet Take 1 tablet by mouth daily.    Multiple Vitamins-Minerals (PRESERVISION AREDS 2 PO) Take 1  capsule by mouth 2 (two) times daily.    Associated Diagnoses: Chest pain, unspecified chest pain type; SOB (shortness of breath)    pantoprazole (PROTONIX) 40 MG tablet Take 40 mg by mouth daily.    pravastatin (PRAVACHOL) 40 MG tablet Take 40 mg by mouth daily.     propranolol (INDERAL) 40 MG tablet Take 40 mg by mouth 2 (two) times daily.    Associated Diagnoses: CLL (chronic lymphocytic leukemia) (HCC)      STOP taking these medications     aspirin 325 MG tablet               Today   CHIEF COMPLAINT:   Patient doing well this point. Patient denies shortness of breath.   VITAL SIGNS:  Blood pressure 143/89, pulse 55, temperature 98.4 F (36.9 C), temperature source Oral, resp. rate 21, height 5\' 5"  (1.651 m), weight 128.6 kg (283 lb 8.2 oz), SpO2 100 %.   REVIEW OF SYSTEMS:  Review of Systems  Constitutional: Negative for fever, chills and malaise/fatigue.  HENT: Negative for ear discharge, ear pain, hearing loss, nosebleeds and sore throat.   Eyes: Negative for blurred vision and pain.  Respiratory: Negative for cough, hemoptysis, shortness of breath and wheezing.   Cardiovascular: Negative for chest pain, palpitations and leg swelling.  Gastrointestinal: Negative for nausea, vomiting, abdominal pain, diarrhea and blood in stool.  Genitourinary: Negative for dysuria.  Musculoskeletal: Negative for back pain.  Neurological: Negative for dizziness, tremors, speech change, focal weakness, seizures and headaches.  Endo/Heme/Allergies: Does not bruise/bleed easily.  Psychiatric/Behavioral: Negative for depression, suicidal ideas and hallucinations.     PHYSICAL EXAMINATION:  GENERAL:  75 y.o.-year-old patient lying in the bed with no acute distress.  NECK:  Supple, no jugular venous distention. No thyroid enlargement, no tenderness.  LUNGS: Normal breath sounds bilaterally, no wheezing, rales,rhonchi  No use of accessory muscles of respiration.  CARDIOVASCULAR:  S1, S2 normal. No murmurs, rubs, or gallops.  ABDOMEN: Soft, non-tender, non-distended. Bowel sounds present. No organomegaly or mass.  EXTREMITIES: No pedal edema, cyanosis, or clubbing.  PSYCHIATRIC: The patient is alert And oriented 3 SKIN: No obvious rash, lesion, or ulcer.   DATA REVIEW:   CBC  Recent Labs Lab 08/16/15 0546  WBC 31.6*  HGB 12.3  HCT 37.8  PLT 163    Chemistries   Recent Labs Lab 08/14/15 1753  08/15/15 0339  NA 144  --  142  K 4.3  --  4.1  CL 107  --  105  CO2 28  --  25  GLUCOSE 159*  --  167*  BUN 20  --  22*  CREATININE 0.98  < > 1.03*  CALCIUM 9.1  --  9.3  AST 24  --   --   ALT 19  --   --   ALKPHOS 91  --   --   BILITOT 0.4  --   --   < > = values in this interval not displayed.  Cardiac Enzymes  Recent Labs Lab 08/14/15 1753 08/14/15 2107 08/15/15 [id number]  TROPONINI 0.21* 0.16* 0.12*    Microbiology Results  @[email or web address] @  RADIOLOGY:  Ct Angio Chest Pe W/cm &/or Wo Cm  08/14/2015  CLINICAL DATA:  Shortness of breath for the past couple of weeks, severe today. EXAM: CT ANGIOGRAPHY CHEST WITH CONTRAST TECHNIQUE: Multidetector CT imaging of the chest was performed using the standard protocol during bolus administration of intravenous contrast. Multiplanar CT image reconstructions and MIPs were obtained to evaluate the vascular anatomy. CONTRAST:  75 cc Isovue 370 intravenous COMPARISON:  02/25/2011 FINDINGS: THORACIC INLET/BODY WALL: No acute abnormality. MEDIASTINUM: There is central/acute pulmonary embolism in all lobes with bilateral lobar clot and multiple occlusive segmental and subsegmental emboli. Some clot has a peripheral marginated appearance suggesting more subacute timing. Dilated right ventricle compared to the left with a ratio of 1.2. No pericardial effusion. The presence of right heart strain has been associated with an increased risk of morbidity and mortality. Atherosclerosis, including the left coronary circulation.  LUNG WINDOWS: No lung infarct or pulmonary edema Right costophrenic sulcus nodule is partly calcified and likely chronic/ stable when allowing for motion on the previous study. 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 UPPER ABDOMEN: No acute findings. OSSEOUS: No acute finding. Critical Value/emergent results were called by telephone at the time of interpretation on 08/14/2015 at 10:06 pm to Dr. Jannifer Franklin, who verbally acknowledged these results. Review of the MIP images confirms the above findings. IMPRESSION: Positive for acute and subacute pulmonary emboli with evidence of right heart strain (RV/LV Ratio = 1.2) consistent with at least submassive (intermediate risk) PE. Please activate Code PE by paging [phone]. Electronically Signed   By: Monte Fantasia M.D.   On: 08/14/2015 22:08   US Venous Img Lower Bilateral  08/15/2015  CLINICAL DATA:  Shortness of breath. Bilateral pain and edema left greater than right. Positive PE on chest CTA. History of CLL. EXAM: BILATERAL LOWER EXTREMITY VENOUS DOPPLER ULTRASOUND TECHNIQUE: Gray-scale sonography with compression, as well as color and duplex ultrasound, were performed to evaluate the deep venous system from the level of the common femoral vein through the popliteal and proximal calf veins. COMPARISON:  09/16/2002 by report only FINDINGS: On the left, Normal compressibility of the common femoral, superficial femoral, and popliteal veins, as well as the proximal calf veins. No filling defects to suggest DVT on grayscale or color Doppler imaging. Doppler waveforms show normal direction of venous flow, normal respiratory phasicity and response to augmentation. There is an elongated 9 x 42 x 63 mm cyst in the posterior popliteal fossa. On the right, thrombosis of a posterior tibial vein, which is noncompressible. Limited visualization of peroneal veins. Normal compressibility of the common femoral, superficial femoral, and popliteal veins. IMPRESSION: 1. Isolated right  calf (posterior tibial) DVT. 2. Negative for left lower extremity DVT. 3. Moderate left  Baker's cyst. Electronically Signed   By: Lucrezia Europe M.D.   On: 08/15/2015 10:43   Dg Chest Portable 1 View  08/14/2015  CLINICAL DATA:  Short of breath EXAM: PORTABLE CHEST 1 VIEW COMPARISON:  05/11/2015 FINDINGS: Mild cardiomegaly. Normal vascularity. Lungs clear. No pneumothorax. No pleural effusion. IMPRESSION: No active disease. Electronically Signed   By: Marybelle Killings M.D.   On: 08/14/2015 18:31      Management plans discussed with the patient and she is in agreement. Stable for discharge home  Patient should follow up with PCP in one week. Vascular surgery in 10 days. Oncologist in 3-5 days.  CODE STATUS:     Code Status Orders  Start     Ordered   08/14/15 1947  Full code   Continuous     08/14/15 1948    Code Status History    Date Active Date Inactive Code Status Order ID Comments User Context   05/11/2015  6:45 PM 05/12/2015  7:41 PM Full Code [id number]  Hillary Bow, MD ED      TOTAL TIME TAKING CARE OF THIS PATIENT: 37 minutes.    Note: This dictation was prepared with Dragon dictation along with smaller phrase technology. Any transcriptional errors that result from this process are unintentional.  Payden Bonus M.D on 08/16/2015 at 12:17 PM  Between 7am to 6pm - Pager - [phone] After 6pm go to [email or web address] - password [email or web address]  Office  [phone]  CC: Primary care physician; Kirk Ruths., MD

## 2015-08-16 NOTE — Progress Notes (Signed)
[facility] Progess Note    ASSESSMENT/PLAN    Acute pulmonary embolism. -Submassive PE, with evidence of right heart strain, now doing much better. -Currently being transitioned from IV heparin to oral anticoagulation, to be continued for 3-6 months. -Status post catheter directed thrombolysis by vascular surgery, now appears to be doing well.  Right calf vein thrombosis. -Likely contributory.  Obesity. -May be a contributing factor to thromboembolic disease. Weight loss would be beneficial.  Obstructive sleep apnea. -Continue CPAP.  Patient is stable to transfer out of the intensive care unit or to be discharged home from a respiratory standpoint, can follow up with [name] on an outpatient basis.  ---------------------------------------   ----------------------------------------   Name: Jamie Phillips MRN: [id number] DOB: [birth year]    ADMISSION DATE:  08/14/2015   SUBJECTIVE:   Patient is feeling significantly better.  Review of Systems:  Constitutional: Feels well. Cardiovascular: No chest pain.  Pulmonary: Denies dyspnea.   The remainder of systems were reviewed and were found to be negative other than what is documented in the HPI.    VITAL SIGNS: Temp:  [97.9 F (36.6 C)-98.5 F (36.9 C)] 98.4 F (36.9 C) (04/26 0800) Pulse Rate:  [48-61] 55 (04/26 0605) Resp:  [14-22] 21 (04/26 0605) BP: (119-165)/(60-93) 143/89 mmHg (04/26 0832) SpO2:  [93 %-100 %] 100 % (04/26 1030) Weight:  [283 lb 8.2 oz (128.6 kg)] 283 lb 8.2 oz (128.6 kg) (04/26 0722) HEMODYNAMICS:   VENTILATOR SETTINGS:   INTAKE / OUTPUT:  Intake/Output Summary (Last 24 hours) at 08/16/15 1241 Last data filed at 08/16/15 0800  Gross per 24 hour  Intake 360.17 ml  Output   1650 ml  Net -1289.83 ml    PHYSICAL EXAMINATION: Physical Examination:   VS: BP 143/89 mmHg  Pulse 55  Temp(Src) 98.4 F (36.9 C) (Oral)  Resp 21  Ht 5\' 5"  (1.651 m)  Wt 283 lb 8.2 oz  (128.6 kg)  BMI 47.18 kg/m2  SpO2 100%  General Appearance: No distress  Neuro:without focal findings, mental status normal. HEENT: PERRLA, EOM intact. Pulmonary: normal breath sounds-decreased air entry both bases.   CardiovascularNormal S1,S2.  No m/r/g.   Abdomen: Benign, Soft, non-tender. Renal:  No costovertebral tenderness  GU:  Not performed at this time. Endocrine: No evident thyromegaly. Skin:   warm, no rashes, no ecchymosis  Extremities: normal, no cyanosis, clubbing.   LABS:   LABORATORY PANEL:   CBC  Recent Labs Lab 08/16/15 0546  WBC 31.6*  HGB 12.3  HCT 37.8  PLT 163    Chemistries   Recent Labs Lab 08/14/15 1753  08/15/15 0339  NA 144  --  142  K 4.3  --  4.1  CL 107  --  105  CO2 28  --  25  GLUCOSE 159*  --  167*  BUN 20  --  22*  CREATININE 0.98  < > 1.03*  CALCIUM 9.1  --  9.3  AST 24  --   --   ALT 19  --   --   ALKPHOS 91  --   --   BILITOT 0.4  --   --   < > = values in this interval not displayed.   Recent Labs Lab 08/14/15 2145  GLUCAP 146*    Recent Labs Lab 08/14/15 2104  PHART 7.48*  PCO2ART 36  PO2ART 67*    Recent Labs Lab 08/14/15 1753  AST 24  ALT 19  ALKPHOS 91  BILITOT  0.4  ALBUMIN 3.6    Cardiac Enzymes  Recent Labs Lab 08/15/15 0339  TROPONINI 0.12*    RADIOLOGY:  Ct Angio Chest Pe W/cm &/or Wo Cm  08/14/2015  CLINICAL DATA:  Shortness of breath for the past couple of weeks, severe today. EXAM: CT ANGIOGRAPHY CHEST WITH CONTRAST TECHNIQUE: Multidetector CT imaging of the chest was performed using the standard protocol during bolus administration of intravenous contrast. Multiplanar CT image reconstructions and MIPs were obtained to evaluate the vascular anatomy. CONTRAST:  75 cc Isovue 370 intravenous COMPARISON:  02/25/2011 FINDINGS: THORACIC INLET/BODY WALL: No acute abnormality. MEDIASTINUM: There is central/acute pulmonary embolism in all lobes with bilateral lobar clot and multiple  occlusive segmental and subsegmental emboli. Some clot has a peripheral marginated appearance suggesting more subacute timing. Dilated right ventricle compared to the left with a ratio of 1.2. No pericardial effusion. The presence of right heart strain has been associated with an increased risk of morbidity and mortality. Atherosclerosis, including the left coronary circulation. LUNG WINDOWS: No lung infarct or pulmonary edema Right costophrenic sulcus nodule is partly calcified and likely chronic/ stable when allowing for motion on the previous study. 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 UPPER ABDOMEN: No acute findings. OSSEOUS: No acute finding. Critical Value/emergent results were called by telephone at the time of interpretation on 08/14/2015 at 10:06 pm to Dr. Jannifer Franklin, who verbally acknowledged these results. Review of the MIP images confirms the above findings. IMPRESSION: Positive for acute and subacute pulmonary emboli with evidence of right heart strain (RV/LV Ratio = 1.2) consistent with at least submassive (intermediate risk) PE. Please activate Code PE by paging [phone]. Electronically Signed   By: Monte Fantasia M.D.   On: 08/14/2015 22:08   US Venous Img Lower Bilateral  08/15/2015  CLINICAL DATA:  Shortness of breath. Bilateral pain and edema left greater than right. Positive PE on chest CTA. History of CLL. EXAM: BILATERAL LOWER EXTREMITY VENOUS DOPPLER ULTRASOUND TECHNIQUE: Gray-scale sonography with compression, as well as color and duplex ultrasound, were performed to evaluate the deep venous system from the level of the common femoral vein through the popliteal and proximal calf veins. COMPARISON:  09/16/2002 by report only FINDINGS: On the left, Normal compressibility of the common femoral, superficial femoral, and popliteal veins, as well as the proximal calf veins. No filling defects to suggest DVT on grayscale or color Doppler imaging. Doppler waveforms show normal direction of venous  flow, normal respiratory phasicity and response to augmentation. There is an elongated 9 x 42 x 63 mm cyst in the posterior popliteal fossa. On the right, thrombosis of a posterior tibial vein, which is noncompressible. Limited visualization of peroneal veins. Normal compressibility of the common femoral, superficial femoral, and popliteal veins. IMPRESSION: 1. Isolated right calf (posterior tibial) DVT. 2. Negative for left lower extremity DVT. 3. Moderate left  Baker's cyst. Electronically Signed   By: Lucrezia Europe M.D.   On: 08/15/2015 10:43   Dg Chest Portable 1 View  08/14/2015  CLINICAL DATA:  Short of breath EXAM: PORTABLE CHEST 1 VIEW COMPARISON:  05/11/2015 FINDINGS: Mild cardiomegaly. Normal vascularity. Lungs clear. No pneumothorax. No pleural effusion. IMPRESSION: No active disease. Electronically Signed   By: Marybelle Killings M.D.   On: 08/14/2015 18:31       --Marda Stalker, MD.  ICU Pager: [phone]  Pulmonary and Critical Care Office Number: [phone]  Patricia Pesa, M.D.  Vilinda Boehringer, M.D.  Merton Border, M.D  08/16/2015

## 2015-08-16 NOTE — Progress Notes (Signed)
Chaplain rounded the unit and provided a compassionate presence and support to the patient. Chaplain [place] [phone] 

## 2015-08-16 NOTE — Progress Notes (Signed)
ANTICOAGULATION CONSULT NOTE - Follow Up Consult  Pharmacy Consult for heparin drip Indication: pulmonary embolus and DVT  Allergies  Allergen Reactions  . Oxycodone Other (See Comments)    Reaction:  Hallucinations  . Morphine Other (See Comments)    Reaction:  Hallucinations    Patient Measurements: Height: 5\' 5"  (165.1 cm) Weight: 224 lb (101.606 kg) IBW/kg (Calculated) : 57 Heparin Dosing Weight: 80.4 kg  Vital Signs: Temp: 97.9 F (36.6 C) (04/26 0400) Temp Source: Oral (04/26 0400) BP: 125/62 mmHg (04/26 0400) Pulse Rate: 53 (04/26 0400)  Labs:  Recent Labs  08/14/15 1753 08/14/15 2107 08/14/15 2218 08/15/15 0339 08/15/15 0741 08/16/15 0546  HGB 13.4 13.5  --  13.5  --  12.3  HCT 41.7 41.5  --  41.1  --  37.8  PLT 176 155  --  172  --  163  APTT  --   --  30  --   --   --   LABPROT  --   --  14.8  --   --   --   INR  --   --  1.14  --   --   --   HEPARINUNFRC  --   --   --   --  0.88* 0.58  CREATININE 0.98 1.11*  --  1.03*  --   --   TROPONINI 0.21* 0.16*  --  0.12*  --   --    Estimated Creatinine Clearance: 57.4 mL/min (by C-G formula based on Cr of 1.03).  Medications:  Scheduled:  . antiseptic oral rinse  7 mL Mouth Rinse q12n4p  . aspirin EC  81 mg Oral Daily  . chlorhexidine  15 mL Mouth Rinse BID  . escitalopram  10 mg Oral Daily  . famotidine  20 mg Oral BID  . gabapentin  300 mg Oral TID  . losartan  100 mg Oral Daily   And  . hydrochlorothiazide  12.5 mg Oral Daily  . ipratropium-albuterol  3 mL Nebulization Q4H  . multivitamin with minerals  1 tablet Oral Daily  . multivitamin-lutein  1 capsule Oral BID  . pravastatin  40 mg Oral Daily  . propranolol  40 mg Oral BID  . sodium chloride flush  3 mL Intravenous Q12H   Infusions:  . heparin 1,150 Units/hr (08/16/15 0300)    Assessment: Pharmacy monitoring and dosing heparin drip in this 75 year old female with PE and right calf DVT. Patient was not taking anticoagulants prior to  admission.   Goal of Therapy:  Heparin level 0.3-0.7 units/ml Monitor platelets by anticoagulation protocol: Yes   Plan:  Heparin level therapeutic x 1. Continue current rate. Will recheck heparin level in 8 hours.  Laural Benes, Pharm.D., BCPS Clinical Pharmacist 08/16/2015,6:19 AM

## 2015-08-16 NOTE — Progress Notes (Signed)
ANTICOAGULATION CONSULT NOTE - Initial Consult  Pharmacy Consult for Apixaban Indication: pulmonary embolus and DVT  Allergies  Allergen Reactions  . Oxycodone Other (See Comments)    Reaction:  Hallucinations  . Morphine Other (See Comments)    Reaction:  Hallucinations    Patient Measurements: Height: 5\' 5"  (165.1 cm) Weight: 283 lb 8.2 oz (128.6 kg) IBW/kg (Calculated) : 57  Vital Signs: Temp: 98.4 F (36.9 C) (04/26 0800) Temp Source: Oral (04/26 0800) BP: 143/89 mmHg (04/26 0832) Pulse Rate: 55 (04/26 0605)  Labs:  Recent Labs  08/14/15 1753 08/14/15 2107 08/14/15 2218 08/15/15 0339 08/15/15 0741 08/16/15 0546  HGB 13.4 13.5  --  13.5  --  12.3  HCT 41.7 41.5  --  41.1  --  37.8  PLT 176 155  --  172  --  163  APTT  --   --  30  --   --   --   LABPROT  --   --  14.8  --   --   --   INR  --   --  1.14  --   --   --   HEPARINUNFRC  --   --   --   --  0.88* 0.58  CREATININE 0.98 1.11*  --  1.03*  --   --   TROPONINI 0.21* 0.16*  --  0.12*  --   --     Estimated Creatinine Clearance: 65.7 mL/min (by C-G formula based on Cr of 1.03).   Medical History: Past Medical History  Diagnosis Date  . Hypertension   . GERD (gastroesophageal reflux disease)   . Hypercholesteremia   . Cancer ([place])     cll  . CLL (chronic lymphocytic leukemia) ([place])   . MGUS (monoclonal gammopathy of unknown significance)   . Hyperlipidemia   . Arthritis   . Hemorrhoids   . Depression   . Obesity   . Anxiety   . Back pain   . 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)     Medications:  Scheduled:  . antiseptic oral rinse  7 mL Mouth Rinse q12n4p  . apixaban  10 mg Oral BID  . [START ON 08/23/2015] apixaban  5 mg Oral BID  . aspirin EC  81 mg Oral Daily  . chlorhexidine  15 mL Mouth Rinse BID  . escitalopram  10 mg Oral Daily  . famotidine  20 mg Oral BID  . gabapentin  300 mg Oral TID  . losartan  100 mg Oral Daily   And  . hydrochlorothiazide  12.5 mg Oral  Daily  . multivitamin with minerals  1 tablet Oral Daily  . multivitamin-lutein  1 capsule Oral BID  . pravastatin  40 mg Oral Daily  . propranolol  40 mg Oral BID  . sodium chloride flush  3 mL Intravenous Q12H   Infusions:    Assessment: 75 y/o F with DVT and submassive PE to transition from heparin drip to apixaban.   Plan:  Apixaban 10 mg bid x 7 days then 5 mg bid.   Ulice Dash D 08/16/2015,2:19 PM

## 2015-08-16 NOTE — Discharge Instructions (Signed)
Hold all BP meds until seen by Primary Care Physician

## 2015-08-17 ENCOUNTER — Telehealth: Payer: Self-pay | Admitting: *Deleted

## 2015-08-17 NOTE — Telephone Encounter (Signed)
-----   Message from Laverle Hobby, MD sent at 08/16/2015 12:53 PM EDT ----- Regarding: hfu Pt needs follow up with me in 2-3 months for PE and OSA.

## 2015-08-17 NOTE — Telephone Encounter (Signed)
appt scheduled

## 2015-08-19 LAB — CULTURE, BLOOD (ROUTINE X 2)
CULTURE: NO GROWTH
Culture: NO GROWTH

## 2015-08-22 ENCOUNTER — Inpatient Hospital Stay: Payer: Commercial Managed Care - HMO | Attending: Internal Medicine | Admitting: Internal Medicine

## 2015-08-22 VITALS — BP 137/86 | HR 85 | Temp 97.1°F | Resp 18 | Wt 282.2 lb

## 2015-08-22 DIAGNOSIS — E785 Hyperlipidemia, unspecified: Secondary | ICD-10-CM | POA: Diagnosis not present

## 2015-08-22 DIAGNOSIS — Z8579 Personal history of other malignant neoplasms of lymphoid, hematopoietic and related tissues: Secondary | ICD-10-CM | POA: Diagnosis not present

## 2015-08-22 DIAGNOSIS — Z79899 Other long term (current) drug therapy: Secondary | ICD-10-CM

## 2015-08-22 DIAGNOSIS — M7122 Synovial cyst of popliteal space [Baker], left knee: Secondary | ICD-10-CM | POA: Insufficient documentation

## 2015-08-22 DIAGNOSIS — K219 Gastro-esophageal reflux disease without esophagitis: Secondary | ICD-10-CM | POA: Diagnosis not present

## 2015-08-22 DIAGNOSIS — D472 Monoclonal gammopathy: Secondary | ICD-10-CM | POA: Insufficient documentation

## 2015-08-22 DIAGNOSIS — I129 Hypertensive chronic kidney disease with stage 1 through stage 4 chronic kidney disease, or unspecified chronic kidney disease: Secondary | ICD-10-CM | POA: Insufficient documentation

## 2015-08-22 DIAGNOSIS — E7800 Pure hypercholesterolemia, unspecified: Secondary | ICD-10-CM | POA: Insufficient documentation

## 2015-08-22 DIAGNOSIS — Z7901 Long term (current) use of anticoagulants: Secondary | ICD-10-CM | POA: Diagnosis not present

## 2015-08-22 DIAGNOSIS — I10 Essential (primary) hypertension: Secondary | ICD-10-CM | POA: Diagnosis not present

## 2015-08-22 DIAGNOSIS — I82401 Acute embolism and thrombosis of unspecified deep veins of right lower extremity: Secondary | ICD-10-CM | POA: Diagnosis not present

## 2015-08-22 DIAGNOSIS — M129 Arthropathy, unspecified: Secondary | ICD-10-CM | POA: Diagnosis not present

## 2015-08-22 DIAGNOSIS — I2699 Other pulmonary embolism without acute cor pulmonale: Secondary | ICD-10-CM

## 2015-08-22 DIAGNOSIS — E669 Obesity, unspecified: Secondary | ICD-10-CM | POA: Insufficient documentation

## 2015-08-22 DIAGNOSIS — F418 Other specified anxiety disorders: Secondary | ICD-10-CM | POA: Diagnosis not present

## 2015-08-22 MED ORDER — APIXABAN 5 MG PO TABS: ORAL_TABLET | ORAL | Status: DC

## 2015-08-22 NOTE — Progress Notes (Signed)
Patient here today as hospital follow up for blood clots in bilateral lungs.

## 2015-08-22 NOTE — Progress Notes (Signed)
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   INTERVAL HISTORY:  A pleasant 75 year old female patient with above history of likely low-grade lymphoproliferative disorder; and history of MGUS- was recently admitted to the hospital for worsening shortness of breath/acute respiratory failure- noted to have bilateral massive PE during thrombolysis. Patient discharged on Eliquis BID.   Patient breathing is improved. Denies any chest pain. She is not on oxygen.  She is swelling in the left knee/posteriorly- noted to have a Baker's cyst with extravasation. She is awaiting to see rheumatologist.   REVIEW OF SYSTEMS:  A complete 10 point review of system is done which is negative except mentioned above/history of present illness.   PAST MEDICAL HISTORY :  Past Medical History  Diagnosis Date  . Hypertension   . GERD (gastroesophageal reflux disease)   . Hypercholesteremia   . Cancer ([place])     cll  . CLL (chronic lymphocytic leukemia) ([place])   . MGUS (monoclonal gammopathy of unknown significance)   . Hyperlipidemia   . Arthritis   . Hemorrhoids   . Depression   . Obesity   . Anxiety   . Back pain   . 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 Neck fusion    . Breast biopsy Left 08/19/04    lt bx/clip-neg  . Wrist surgery Left 2000  . Peripheral vascular catheterization N/A 08/15/2015    Procedure: pulmonary  angiogram with lysis;  Surgeon: Katha Cabal, MD;  Location: [place] [facility];  Service: Cardiovascular;  Laterality: N/A;    FAMILY HISTORY :   Family History  Problem Relation Age of Onset  . Heart failure Father   . Pulmonary embolism Mother   . Congestive Heart Failure Father     SOCIAL HISTORY:   Social History  Substance Use Topics  . Smoking status: Never Smoker   . Smokeless tobacco: Never Used  . Alcohol Use: No    ALLERGIES:  is allergic to oxycodone and morphine.  MEDICATIONS:  Current Outpatient Prescriptions  Medication Sig Dispense Refill  . albuterol (PROVENTIL HFA;VENTOLIN HFA) 108 (90 Base) MCG/ACT inhaler Inhale 2 puffs into the lungs every 6 (six) hours as needed for wheezing or shortness of breath.    Marland Kitchen apixaban (ELIQUIS) 5 MG TABS tablet Take 1 tablet (5 mg total) by mouth 2 (two) times daily. Take 10 mg Twice a day for 7 days (first dose given in hospital) Then 5 mg twice a day 80 tablet 0  . escitalopram (LEXAPRO) 10 MG tablet Take 10 mg by mouth daily.    Marland Kitchen gabapentin (NEURONTIN) 300 MG capsule Take 300 mg by mouth 3 (three) times daily.    Marland Kitchen HYDROcodone-acetaminophen (NORCO/VICODIN) 5-325 MG tablet Take 1 tablet by mouth every 6 (six) hours as needed for moderate pain.     . Multiple Vitamin (MULTIVITAMIN WITH MINERALS) TABS tablet Take 1 tablet by mouth daily.    . Multiple Vitamins-Minerals (PRESERVISION  AREDS 2 PO) Take 1 capsule by mouth 2 (two) times daily.     . pantoprazole (PROTONIX) 40 MG tablet Take 40 mg by mouth daily.    . pravastatin (PRAVACHOL) 40 MG tablet Take 40 mg by mouth daily.     Marland Kitchen losartan-hydrochlorothiazide (HYZAAR) 100-12.5 MG tablet Take 1 tablet by mouth daily. Reported on 08/22/2015    . propranolol (INDERAL) 40 MG tablet Take 40 mg by mouth 2 (two) times daily. Reported on 08/22/2015     No current facility-administered medications for this visit.    PHYSICAL EXAMINATION:   BP 137/86 mmHg  Pulse 85  Temp(Src)  97.1 F (36.2 C) (Tympanic)  Resp 18  Wt 282 lb 3 oz (128 kg)  Filed Weights   08/22/15 1120  Weight: 282 lb 3 oz (128 kg)    GENERAL: Well-nourished well-developed; Alert, no distress and comfortable.   Obese. Walking herself. EYES: no pallor or icterus OROPHARYNX: no thrush or ulceration; good dentition  NECK: supple, no masses felt LYMPH:  no palpable lymphadenopathy in the cervical, axillary or inguinal regions LUNGS: clear to auscultation and  No wheeze or crackles HEART/CVS: regular rate & rhythm and no murmurs;  ABDOMEN:abdomen soft, non-tender and normal bowel sounds Musculoskeletal:no cyanosis of digits and no clubbing. Left lower extremity knee swollen. PSYCH: alert & oriented x 3 with fluent speech NEURO: no focal motor/sensory deficits SKIN:  Multiple ecchymosis noted.  LABORATORY DATA:  I have reviewed the data as listed    Component Value Date/Time   NA 142 08/15/2015 0339   NA 141 04/29/2012 1822   K 4.1 08/15/2015 0339   K 3.9 04/29/2012 1822   CL 105 08/15/2015 0339   CL 107 04/29/2012 1822   CO2 25 08/15/2015 0339   CO2 28 04/29/2012 1822   GLUCOSE 167* 08/15/2015 0339   GLUCOSE 129* 04/29/2012 1822   BUN 22* 08/15/2015 0339   BUN 21* 04/29/2012 1822   CREATININE 1.03* 08/15/2015 0339   CREATININE 1.02* 07/28/2014 0950   CALCIUM 9.3 08/15/2015 0339   CALCIUM 8.7 04/29/2012 1822   PROT 6.2* 08/14/2015 1753   PROT 7.1 04/29/2012 1822   ALBUMIN 3.6 08/14/2015 1753   ALBUMIN 3.3* 04/29/2012 1822   AST 24 08/14/2015 1753   AST 20 04/29/2012 1822   ALT 19 08/14/2015 1753   ALT 33 04/29/2012 1822   ALKPHOS 91 08/14/2015 1753   ALKPHOS 169* 04/29/2012 1822   BILITOT 0.4 08/14/2015 1753   BILITOT 0.3 04/29/2012 1822   GFRNONAA 53* 08/15/2015 0339   GFRNONAA 55* 07/28/2014 0950   GFRNONAA 46* 07/04/2011 0949   GFRAA >60 08/15/2015 0339   GFRAA >60 07/28/2014 0950   GFRAA 55* 07/04/2011 0949    No results found for: SPEP, UPEP  Lab Results   Component Value Date   WBC 31.6* 08/16/2015   NEUTROABS 8.7* 08/14/2015   HGB 12.3 08/16/2015   HCT 37.8 08/16/2015   MCV 90.9 08/16/2015   PLT 163 08/16/2015      Chemistry      Component Value Date/Time   NA 142 08/15/2015 0339   NA 141 04/29/2012 1822   K 4.1 08/15/2015 0339   K 3.9 04/29/2012 1822   CL 105 08/15/2015 0339   CL 107 04/29/2012 1822   CO2 25 08/15/2015 0339   CO2 28 04/29/2012 1822   BUN 22* 08/15/2015 0339   BUN 21* 04/29/2012 1822   CREATININE 1.03* 08/15/2015 0339   CREATININE 1.02* 07/28/2014 0950  Component Value Date/Time   CALCIUM 9.3 08/15/2015 0339   CALCIUM 8.7 04/29/2012 1822   ALKPHOS 91 08/14/2015 1753   ALKPHOS 169* 04/29/2012 1822   AST 24 08/14/2015 1753   AST 20 04/29/2012 1822   ALT 19 08/14/2015 1753   ALT 33 04/29/2012 1822   BILITOT 0.4 08/14/2015 1753   BILITOT 0.3 04/29/2012 1822        ASSESSMENT & PLAN:   # Massive bilateral Pulmonary Embolus- s/p Thrombolysis on Eliquis- Unprovoked. Unclear etiology. 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 Patient would require at least one year of anticoagulation; probably indefinite based upon her clinical situation. A new prescription for  Eliquis was given. I also discussed the importance of avoiding falls trauma especially as this cannot be reversed.  # Chronic low-grade B-cell lymphoproliferative disorder- based on peripheral blood flow cytometry. I do not think this is the reason for her blood clots. Most recent white count was 30,000 in the hospital. Will repeat labs again in 3 months/next visit  # MGUS- 0.2 g/dL IgA kappa in 2006. However most recent protein electrophoresis in October 2016 with the normal limits. I think patient has faint positive monoclonal protein only on immunofixation. We will repeat labs at next visit.  # Chronic kidney disease creatinine 1.16/ stable.   # Patient follow-up with me in approximately 3 months up on labs.  # 25 minutes  face-to-face with the patient discussing the above plan of care; more than 50% of time spent on prognosis/ natural history; counseling and coordination.     Cammie Sickle, MD 08/22/2015 11:36 AM

## 2015-08-23 DIAGNOSIS — M25562 Pain in left knee: Secondary | ICD-10-CM | POA: Diagnosis not present

## 2015-08-23 DIAGNOSIS — G8929 Other chronic pain: Secondary | ICD-10-CM | POA: Diagnosis not present

## 2015-08-25 DIAGNOSIS — I2782 Chronic pulmonary embolism: Secondary | ICD-10-CM | POA: Insufficient documentation

## 2015-08-25 DIAGNOSIS — I2699 Other pulmonary embolism without acute cor pulmonale: Secondary | ICD-10-CM | POA: Diagnosis not present

## 2015-08-28 DIAGNOSIS — I2699 Other pulmonary embolism without acute cor pulmonale: Secondary | ICD-10-CM | POA: Diagnosis not present

## 2015-08-28 DIAGNOSIS — M79609 Pain in unspecified limb: Secondary | ICD-10-CM | POA: Diagnosis not present

## 2015-08-28 DIAGNOSIS — I890 Lymphedema, not elsewhere classified: Secondary | ICD-10-CM | POA: Diagnosis not present

## 2015-08-28 DIAGNOSIS — M7989 Other specified soft tissue disorders: Secondary | ICD-10-CM | POA: Diagnosis not present

## 2015-08-28 DIAGNOSIS — I872 Venous insufficiency (chronic) (peripheral): Secondary | ICD-10-CM | POA: Diagnosis not present

## 2015-08-28 DIAGNOSIS — I87093 Postthrombotic syndrome with other complications of bilateral lower extremity: Secondary | ICD-10-CM | POA: Diagnosis not present

## 2015-09-21 ENCOUNTER — Ambulatory Visit
Admission: RE | Admit: 2015-09-21 | Discharge: 2015-09-21 | Disposition: A | Discharge status: Home / Self Care | Payer: Commercial Managed Care - HMO | Source: Ambulatory Visit | Attending: Internal Medicine | Admitting: Internal Medicine

## 2015-09-21 DIAGNOSIS — M898X6 Other specified disorders of bone, lower leg: Secondary | ICD-10-CM | POA: Diagnosis not present

## 2015-09-21 DIAGNOSIS — M1712 Unilateral primary osteoarthritis, left knee: Secondary | ICD-10-CM | POA: Diagnosis not present

## 2015-09-21 DIAGNOSIS — M1711 Unilateral primary osteoarthritis, right knee: Secondary | ICD-10-CM | POA: Diagnosis not present

## 2015-09-21 DIAGNOSIS — Z1231 Encounter for screening mammogram for malignant neoplasm of breast: Secondary | ICD-10-CM

## 2015-10-05 DIAGNOSIS — M1712 Unilateral primary osteoarthritis, left knee: Secondary | ICD-10-CM | POA: Diagnosis not present

## 2015-10-11 DIAGNOSIS — G4733 Obstructive sleep apnea (adult) (pediatric): Secondary | ICD-10-CM | POA: Diagnosis not present

## 2015-10-12 ENCOUNTER — Encounter: Payer: Self-pay | Admitting: Internal Medicine

## 2015-10-12 ENCOUNTER — Ambulatory Visit (INDEPENDENT_AMBULATORY_CARE_PROVIDER_SITE_OTHER): Payer: Commercial Managed Care - HMO | Admitting: Internal Medicine

## 2015-10-12 VITALS — BP 134/88 | HR 54 | Ht 65.0 in | Wt 289.0 lb

## 2015-10-12 DIAGNOSIS — I2609 Other pulmonary embolism with acute cor pulmonale: Secondary | ICD-10-CM

## 2015-10-12 NOTE — Patient Instructions (Signed)
Continue eliquis  ?

## 2015-10-12 NOTE — Progress Notes (Signed)
* [facility]     Assessment and Plan:  Pulmonary embolism --Continue anticoagulation for at least 6 months from incident and then can consider discontinuation.  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  --Has a family history of PE, continue workup of PE as per oncology.   Right lower extremity DVT. --She is on eliquis, to continue.  --Continue LE compression stockings.   Obstructive sleep apnea. -Currently on CPAP, to continue.   Lymphoproliferative disorder. -Low-grade CLL, may be contributing to thromboembolic disease.  Obesity.  --Likely contributing to dyspnea. --She is very frustrated that she is eating very little, and gaining a lot of weight.    Date: 10/12/2015  MRN# [id number] ANYCIA Phillips 1940/08/13   Jamie Phillips is a 75 y.o. old female seen in follow up for chief complaint of  Chief Complaint  Patient presents with  . Advice Only    SOB w/any activity; chest tightness at times;  had recent PE; No cough, CP     HPI:   Patient is a 75 year old female seen in the hospital for development of acute pulmonary embolism, discharged on 08/16/15 on Eliquis. The patient was found to have large pulmonary embolism with evidence of RV strain and right lower extremity DVT, she underwent anticoagulation and underwent catheter directed thrombolysis. She was also noted to have a history of obstructive sleep apnea on CPAP. She is also noted to have a history of low-grade lymphoproliferative disorder being followed by oncology.   She notes that her breathing has been ok, she has been limited primarily by knee pain most in her right knee. She is wearing compression stocking on both feet. She is still driving, she is able to do her housework, she takes care of a 80 yo grand daughter.     Medication:   Outpatient Encounter Prescriptions as of 10/12/2015  Medication Sig  . albuterol  (PROVENTIL HFA;VENTOLIN HFA) 108 (90 Base) MCG/ACT inhaler Inhale 2 puffs into the lungs every 6 (six) hours as needed for wheezing or shortness of breath.  Marland Kitchen apixaban (ELIQUIS) 5 MG TABS tablet 5 mg twice a day  . escitalopram (LEXAPRO) 10 MG tablet Take 10 mg by mouth daily.  Marland Kitchen gabapentin (NEURONTIN) 300 MG capsule Take 300 mg by mouth 3 (three) times daily.  Marland Kitchen HYDROcodone-acetaminophen (NORCO/VICODIN) 5-325 MG tablet Take 1 tablet by mouth every 6 (six) hours as needed for moderate pain.   Marland Kitchen losartan-hydrochlorothiazide (HYZAAR) 100-12.5 MG tablet Take 1 tablet by mouth daily. Reported on 08/22/2015  . Multiple Vitamin (MULTIVITAMIN WITH MINERALS) TABS tablet Take 1 tablet by mouth daily.  . Multiple Vitamins-Minerals (PRESERVISION AREDS 2 PO) Take 1 capsule by mouth 2 (two) times daily.   . pantoprazole (PROTONIX) 40 MG tablet Take 40 mg by mouth daily.  . pravastatin (PRAVACHOL) 40 MG tablet Take 40 mg by mouth daily.   . propranolol (INDERAL) 40 MG tablet Take 40 mg by mouth 2 (two) times daily. Reported on 08/22/2015   No facility-administered encounter medications on file as of 10/12/2015.     Allergies:  Oxycodone and Morphine  Review of Systems: Gen:  Denies  fever, sweats. HEENT: Denies blurred vision. Cvc:  No dizziness, chest pain or heaviness Resp:   Denies cough or sputum porduction. Gi: Denies swallowing difficulty, stomach pain. constipation, bowel incontinence Gu:  Denies bladder incontinence, burning urine Ext:   No Joint pain, stiffness. Skin: No skin rash, easy bruising.  Endoc:  No polyuria, polydipsia. Psych: No depression, insomnia. Other:  All other systems were reviewed and found to be negative other than what is mentioned in the HPI.   Physical Examination:   VS: BP 134/88 mmHg  Pulse 54  Ht 5\' 5"  (1.651 m)  Wt 289 lb (131.09 kg)  BMI 48.09 kg/m2  SpO2 96%   General Appearance: No distress  Neuro:without focal findings,  speech normal,  HEENT: PERRLA, EOM  intact. Pulmonary: normal breath sounds, No wheezing.  Pinpoint tenderness at right flank. CardiovascularNormal S1,S2.  No m/r/g.   Abdomen: Benign, Soft, non-tender. Renal:  No costovertebral tenderness  GU:  Not performed at this time. Endoc: No evident thyromegaly, no signs of acromegaly. Skin:   warm, no rash. Extremities: normal, no cyanosis, clubbing. 2+ bilateral edema.    LABORATORY PANEL:   CBC No results for input(s): WBC, HGB, HCT, PLT in the last 168 hours. ------------------------------------------------------------------------------------------------------------------  Chemistries  No results for input(s): NA, K, CL, CO2, GLUCOSE, BUN, CREATININE, CALCIUM, MG, AST, ALT, ALKPHOS, BILITOT in the last 168 hours.  Invalid input(s): GFRCGP ------------------------------------------------------------------------------------------------------------------  Cardiac Enzymes No results for input(s): TROPONINI in the last 168 hours. ------------------------------------------------------------  RADIOLOGY:   No results found for this or any previous visit. Results for orders placed during the hospital encounter of 05/11/15  DG Chest 2 View   Narrative CLINICAL DATA:  Shortness of breath for 3 days with coughing congestion.  EXAM: CHEST  2 VIEW  COMPARISON:  02/27/2015  FINDINGS: The cardiac silhouette remains mildly enlarged. There is minimal atelectasis in the right lung base. No confluent airspace opacity, edema, pleural effusion, or pneumothorax is identified. Prior cervical spine fusion and thoracic spondylosis are noted.  IMPRESSION: No active cardiopulmonary disease.   Electronically Signed   By: Logan Bores M.D.   On: 05/11/2015 13:46    ------------------------------------------------------------------------------------------------------------------  Thank  you for allowing [facility] to assist in the care of your patient.  Our recommendations are noted above.  Please contact us if we can be of further service.   Marda Stalker, MD.  [facility] Office Number: [phone]  Patricia Pesa, M.D.  Vilinda Boehringer, M.D.  Merton Border, M.D  10/12/2015

## 2015-10-16 DIAGNOSIS — M1711 Unilateral primary osteoarthritis, right knee: Secondary | ICD-10-CM | POA: Diagnosis not present

## 2015-11-02 DIAGNOSIS — M1711 Unilateral primary osteoarthritis, right knee: Secondary | ICD-10-CM | POA: Diagnosis not present

## 2015-11-02 DIAGNOSIS — M1712 Unilateral primary osteoarthritis, left knee: Secondary | ICD-10-CM | POA: Diagnosis not present

## 2015-11-03 ENCOUNTER — Other Ambulatory Visit: Payer: Self-pay | Admitting: Unknown Physician Specialty

## 2015-11-03 DIAGNOSIS — M1712 Unilateral primary osteoarthritis, left knee: Secondary | ICD-10-CM

## 2015-11-12 ENCOUNTER — Emergency Department
Admission: EM | Admit: 2015-11-12 | Discharge: 2015-11-13 | Disposition: A | Discharge status: Home / Self Care | Payer: Commercial Managed Care - HMO | Attending: Emergency Medicine | Admitting: Emergency Medicine

## 2015-11-12 ENCOUNTER — Encounter: Payer: Self-pay | Admitting: Emergency Medicine

## 2015-11-12 ENCOUNTER — Emergency Department: Payer: Commercial Managed Care - HMO

## 2015-11-12 DIAGNOSIS — Z79899 Other long term (current) drug therapy: Secondary | ICD-10-CM | POA: Diagnosis not present

## 2015-11-12 DIAGNOSIS — Z7982 Long term (current) use of aspirin: Secondary | ICD-10-CM | POA: Diagnosis not present

## 2015-11-12 DIAGNOSIS — R0602 Shortness of breath: Secondary | ICD-10-CM | POA: Diagnosis not present

## 2015-11-12 DIAGNOSIS — I10 Essential (primary) hypertension: Secondary | ICD-10-CM | POA: Insufficient documentation

## 2015-11-12 HISTORY — DX: Other pulmonary embolism without acute cor pulmonale: I26.99

## 2015-11-12 LAB — BASIC METABOLIC PANEL WITH GFR
Anion gap: 7 (ref 5–15)
BUN: 21 mg/dL — ABNORMAL HIGH (ref 6–20)
CO2: 30 mmol/L (ref 22–32)
Calcium: 9.2 mg/dL (ref 8.9–10.3)
Chloride: 106 mmol/L (ref 101–111)
Creatinine, Ser: 1.03 mg/dL — ABNORMAL HIGH (ref 0.44–1.00)
GFR calc Af Amer: >60 mL/min
GFR calc non Af Amer: 53 mL/min — ABNORMAL LOW
Glucose, Bld: 121 mg/dL — ABNORMAL HIGH (ref 65–99)
Potassium: 4.9 mmol/L (ref 3.5–5.1)
Sodium: 143 mmol/L (ref 135–145)

## 2015-11-12 LAB — CBC
HCT: 41.8 % (ref 35.0–47.0)
HEMOGLOBIN: 13.9 g/dL (ref 12.0–16.0)
MCH: 30.7 pg (ref 26.0–34.0)
MCHC: 33.3 g/dL (ref 32.0–36.0)
MCV: 92.2 fL (ref 80.0–100.0)
PLATELETS: 231 10*3/uL (ref 150–440)
RBC: 4.54 MIL/uL (ref 3.80–5.20)
RDW: 15.4 % — ABNORMAL HIGH (ref 11.5–14.5)
WBC: 23 10*3/uL — AB (ref 3.6–11.0)

## 2015-11-12 LAB — HEPATIC FUNCTION PANEL
ALT: 18 U/L (ref 14–54)
AST: 20 U/L (ref 15–41)
Albumin: 3.8 g/dL (ref 3.5–5.0)
Alkaline Phosphatase: 102 U/L (ref 38–126)
Bilirubin, Direct: <0.1 mg/dL — ABNORMAL LOW (ref 0.1–0.5)
Total Bilirubin: 0.4 mg/dL (ref 0.3–1.2)
Total Protein: 6.5 g/dL (ref 6.5–8.1)

## 2015-11-12 LAB — TROPONIN I: Troponin I: <0.03 ng/mL

## 2015-11-12 LAB — LIPASE, BLOOD: Lipase: 36 U/L (ref 11–51)

## 2015-11-12 MED ORDER — GI COCKTAIL ~~LOC~~: 30.0000 mL | Freq: Once | ORAL | Status: DC

## 2015-11-12 NOTE — ED Triage Notes (Signed)
Pt presents to ED with sob. Pt states she has been feeling badly all day but had a sudden onset around 1900. Hx of PE. Denies chest pain. Increase work of breathing noted. Pt appears anxious and states she didn't want to wait to long to come because of what happened last time when she had her PE. Pt reports using her c-pap at home with no relief.

## 2015-11-13 ENCOUNTER — Encounter: Payer: Self-pay | Admitting: Radiology

## 2015-11-13 ENCOUNTER — Emergency Department: Payer: Commercial Managed Care - HMO

## 2015-11-13 DIAGNOSIS — R0602 Shortness of breath: Secondary | ICD-10-CM | POA: Diagnosis not present

## 2015-11-13 LAB — TROPONIN I: Troponin I: <0.03 ng/mL (ref <0.03)

## 2015-11-13 MED ORDER — TECHNETIUM TO 99M ALBUMIN AGGREGATED
4.0000 | Freq: Once | INTRAVENOUS | Status: AC | PRN
  Administered 2015-11-13: 3.771 via INTRAVENOUS

## 2015-11-13 MED ORDER — TECHNETIUM TC 99M DIETHYLENETRIAME-PENTAACETIC ACID
31.8200 | Freq: Once | INTRAVENOUS | Status: AC | PRN
  Administered 2015-11-13: 31.82 via INTRAVENOUS

## 2015-11-13 NOTE — ED Provider Notes (Signed)
[facility] Emergency Department Provider Note   ____________________________________________  Time seen: Approximately 930pm  I have reviewed the triage vital signs and the nursing notes.   HISTORY  Chief Complaint Shortness of Breath  HPI Jamie Phillips is a 75 y.o. female with a history of pulmonary embolus on eliquis who is presenting to the emergency department today with shortness of breath. She is also describing a burning epigastric feeling. Said that she has a history of GERD and takes Protonix. Denies any chest pain. Denies any cough. Said that she tried putting her CPAP earlier tonight seemed to help but it did not. She came to the emergency department because she says that she felt similarly the last time she had a pulmonary embolus. She says that she has been compliant with her eliquis.Also with a history of CLL.   Past Medical History:  Diagnosis Date  . Anxiety   . Arthritis   . Back pain   . Cancer ([place])    cll  . CLL (chronic lymphocytic leukemia) ([place])   . Depression   . GERD (gastroesophageal reflux disease)   . Hemorrhoids   . Hypercholesteremia   . Hyperlipidemia   . Hypertension   . 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 MGUS (monoclonal gammopathy of unknown significance)   . Obesity   . Pulmonary embolism [facility])     Patient Active Problem List   Diagnosis Date Noted  . Acute respiratory failure ([place]) 08/14/2015  . Acute bronchitis 05/11/2015  . Essential hypertension 05/04/2015  . Hyperlipidemia 05/04/2015  . Pain in the chest 05/04/2015  . SOB (shortness of breath) 05/04/2015    Past Surgical History:  Procedure Laterality Date  . BREAST BIOPSY Left 08/19/04   lt bx/clip-neg  . neck fusion    . PERIPHERAL VASCULAR CATHETERIZATION N/A 08/15/2015   Procedure: pulmonary angiogram with lysis;  Surgeon: Katha Cabal, MD;  Location: [place] [facility];  Service: Cardiovascular;  Laterality: N/A;   . WRIST SURGERY Left 2000    Current Outpatient Rx  . Order #: [id number] Class: Historical Med  . Order #: [id number] Class: Print  . Order #: [id number] Class: Historical Med  . Order #: [id number] Class: Historical Med  . Order #: [id number] Class: Historical Med  . Order #: [id number] Class: Historical Med  . Order #: [id number] Class: Historical Med  . Order #: [id number] Class: Historical Med  . Order #: [id number] Class: Historical Med  . Order #: [id number] Class: Historical Med  . Order #: [id number] Class: Historical Med  . Order #: [id number] Class: Historical Med    Allergies Oxycodone and Morphine  Family History  Problem Relation Age of Onset  . Heart failure Father   . Congestive Heart Failure Father   . Pulmonary embolism Mother   . Breast cancer Neg Hx     Social History Social History  Substance Use Topics  . Smoking status: Never Smoker  . Smokeless tobacco: Never Used  . Alcohol use No    Review of Systems Constitutional: No fever/chills Eyes: No visual changes. ENT: No sore throat. Cardiovascular: Denies chest pain. Respiratory:As above Gastrointestinal: No abdominal pain.  No nausea, no vomiting.  No diarrhea.  No constipation. Genitourinary: Negative for dysuria. Musculoskeletal: Negative for back pain. Skin: Negative for rash. Neurological: Negative for headaches, focal weakness or numbness.  10-point ROS otherwise negative.  ____________________________________________   PHYSICAL EXAM:  VITAL SIGNS: ED Triage Vitals  Enc Vitals Group     BP 11/12/15 2130 129/73  Pulse Rate 11/12/15 2108 70     Resp 11/12/15 2108 (!) 28     Temp 11/12/15 2108 98 F (36.7 C)     Temp Source 11/12/15 2108 Oral     SpO2 11/12/15 2108 98 %     Weight 11/12/15 2108 270 lb (122.5 kg)     Height 11/12/15 2108 5\' 5"  (1.651 m)     Head Circumference --      Peak Flow --      Pain Score 11/12/15 2109 0     Pain Loc --      Pain Edu? --      Excl. in [place]? --      Constitutional: Alert and oriented. Well appearing and in no acute distress. Eyes: Conjunctivae are normal. PERRL. EOMI. Head: Atraumatic. Nose: No congestion/rhinnorhea. Mouth/Throat: Mucous membranes are moist.   Neck: No stridor.   Cardiovascular: Normal rate, regular rhythm. Grossly normal heart sounds.   Respiratory: Normal respiratory effort.  No retractions. Lungs CTAB. Gastrointestinal: Soft and nontender. No distention.  Musculoskeletal: No lower extremity tenderness nor edema.  No joint effusions. Neurologic:  Normal speech and language. No gross focal neurologic deficits are appreciated.  Skin:  Skin is warm, dry and intact. No rash noted. Psychiatric: Mood and affect are normal. Speech and behavior are normal.  ____________________________________________   LABS (all labs ordered are listed, but only abnormal results are displayed)  Labs Reviewed  BASIC METABOLIC PANEL - Abnormal; Notable for the following:       Result Value   Glucose, Bld 121 (*)    BUN 21 (*)    Creatinine, Ser 1.03 (*)    GFR calc non Af Amer 53 (*)    All other components within normal limits  CBC - Abnormal; Notable for the following:    WBC 23.0 (*)    RDW 15.4 (*)    All other components within normal limits  HEPATIC FUNCTION PANEL - Abnormal; Notable for the following:    Bilirubin, Direct <0.1 (*)    All other components within normal limits  TROPONIN I  LIPASE, BLOOD   ____________________________________________  EKG  ED ECG REPORT I, [place],  Youlanda Roys, the attending physician, personally viewed and interpreted this ECG.   Date: 11/13/2015  EKG Time: 2110  Rate: 69  Rhythm: normal sinus rhythm  Axis: Left axis deviation  Intervals:none  ST&T Change: No ST segment elevation. T-wave inversions in V2 and V3. EKG machine read as lateral lead depression but this is likely related to baseline service.  T-wave inversion similar to previous EKGs from April  2017.  ____________________________________________  RADIOLOGY  CLINICAL DATA:  Initial evaluation for acute shortness of breath. EXAM: CHEST  2 VIEW COMPARISON:  Prior radiograph from 08/14/2015. FINDINGS: The cardiac and mediastinal silhouettes are stable in size and contour, and remain within normal limits. The lungs are normally inflated. No airspace consolidation, pleural effusion, or pulmonary edema is identified. There is no pneumothorax. No acute osseous abnormality. Cervical ACDF noted. Multilevel degenerative spurring noted within the thoracic spine. Degenerative changes also noted about the shoulders. IMPRESSION: No active cardiopulmonary disease. Electronically Signed   By: Jeannine Boga M.D.   On: 11/12/2015 22:19 ____________________________________________   PROCEDURES   Procedures  ____________________________________________   INITIAL IMPRESSION / ASSESSMENT AND PLAN / ED COURSE  Pertinent labs & imaging results that were available during my care of the patient were reviewed by me and considered in my medical decision making (see chart for  details).  ----------------------------------------- 12:10 AM on 11/13/2015 -----------------------------------------  Patient says that she is feeling much better at this time. Will proceed with VQ scan.  Signed out to Dr. Kerman Passey. ____________________________________________   FINAL CLINICAL IMPRESSION(S) / ED DIAGNOSES  Final diagnoses:  Shortness of breath      NEW MEDICATIONS STARTED DURING THIS VISIT:  New Prescriptions   No medications on file     Note:  This document was prepared using Dragon voice recognition software and may include unintentional dictation errors.    Orbie Pyo, MD 11/13/15 [phone]

## 2015-11-13 NOTE — ED Notes (Signed)
Reviewed d/c instructions, follow-up instructions with pt. Pt verbalized understanding

## 2015-11-13 NOTE — ED Provider Notes (Signed)
-----------------------------------------   4:24 AM on 11/13/2015 -----------------------------------------  VQ scan shows low probability for PE. Patient is requesting discharge home at this time. Overall she looks very well with baseline labs, clear chest x-ray and a low probability VQ scan. We will discharge with PCP follow-up.   Harvest Dark, MD 11/13/15 [phone]

## 2015-11-13 NOTE — ED Notes (Signed)
Pt denies current SOB. Pt in no apparent distress. Pt clear all lung fields

## 2015-11-14 ENCOUNTER — Inpatient Hospital Stay: Payer: Commercial Managed Care - HMO | Attending: Internal Medicine

## 2015-11-14 DIAGNOSIS — D472 Monoclonal gammopathy: Secondary | ICD-10-CM

## 2015-11-14 DIAGNOSIS — I2699 Other pulmonary embolism without acute cor pulmonale: Secondary | ICD-10-CM | POA: Diagnosis not present

## 2015-11-14 DIAGNOSIS — D47Z9 Other specified neoplasms of uncertain behavior of lymphoid, hematopoietic and related tissue: Secondary | ICD-10-CM | POA: Insufficient documentation

## 2015-11-14 LAB — CBC WITH DIFFERENTIAL/PLATELET
BASOS ABS: 0.1 10*3/uL (ref 0–0.1)
Basophils Relative: 1 %
EOS PCT: 1 %
Eosinophils Absolute: 0.2 10*3/uL (ref 0–0.7)
HCT: 40.7 % (ref 35.0–47.0)
Hemoglobin: 13.4 g/dL (ref 12.0–16.0)
LYMPHS PCT: 76 %
Lymphs Abs: 16.1 10*3/uL — ABNORMAL HIGH (ref 1.0–3.6)
MCH: 30.5 pg (ref 26.0–34.0)
MCHC: 32.9 g/dL (ref 32.0–36.0)
MCV: 92.9 fL (ref 80.0–100.0)
MONO ABS: 0.9 10*3/uL (ref 0.2–0.9)
Monocytes Relative: 4 %
Neutro Abs: 3.8 10*3/uL (ref 1.4–6.5)
Neutrophils Relative %: 18 %
PLATELETS: 218 10*3/uL (ref 150–440)
RBC: 4.38 MIL/uL (ref 3.80–5.20)
RDW: 15.1 % — AB (ref 11.5–14.5)
WBC: 21.2 10*3/uL — ABNORMAL HIGH (ref 3.6–11.0)

## 2015-11-14 LAB — COMPREHENSIVE METABOLIC PANEL
ALT: 16 U/L (ref 14–54)
AST: 15 U/L (ref 15–41)
Albumin: 3.7 g/dL (ref 3.5–5.0)
Alkaline Phosphatase: 92 U/L (ref 38–126)
Anion gap: 6 (ref 5–15)
BUN: 21 mg/dL — AB (ref 6–20)
CHLORIDE: 104 mmol/L (ref 101–111)
CO2: 30 mmol/L (ref 22–32)
Calcium: 9.2 mg/dL (ref 8.9–10.3)
Creatinine, Ser: 1.13 mg/dL — ABNORMAL HIGH (ref 0.44–1.00)
GFR, EST AFRICAN AMERICAN: 54 mL/min — AB (ref >60)
GFR, EST NON AFRICAN AMERICAN: 47 mL/min — AB (ref >60)
Glucose, Bld: 117 mg/dL — ABNORMAL HIGH (ref 65–99)
POTASSIUM: 4 mmol/L (ref 3.5–5.1)
Sodium: 140 mmol/L (ref 135–145)
Total Bilirubin: 0.7 mg/dL (ref 0.3–1.2)
Total Protein: 6.2 g/dL — ABNORMAL LOW (ref 6.5–8.1)

## 2015-11-14 LAB — LACTATE DEHYDROGENASE: LDH: 180 U/L (ref 98–192)

## 2015-11-16 ENCOUNTER — Ambulatory Visit
Admission: RE | Admit: 2015-11-16 | Discharge: 2015-11-16 | Disposition: A | Discharge status: Home / Self Care | Payer: Commercial Managed Care - HMO | Source: Ambulatory Visit | Attending: Unknown Physician Specialty | Admitting: Unknown Physician Specialty

## 2015-11-16 DIAGNOSIS — M7122 Synovial cyst of popliteal space [Baker], left knee: Secondary | ICD-10-CM | POA: Diagnosis not present

## 2015-11-16 DIAGNOSIS — S83242A Other tear of medial meniscus, current injury, left knee, initial encounter: Secondary | ICD-10-CM | POA: Diagnosis not present

## 2015-11-16 DIAGNOSIS — M25562 Pain in left knee: Secondary | ICD-10-CM | POA: Diagnosis not present

## 2015-11-16 DIAGNOSIS — M1712 Unilateral primary osteoarthritis, left knee: Secondary | ICD-10-CM | POA: Diagnosis not present

## 2015-11-16 DIAGNOSIS — X58XXXA Exposure to other specified factors, initial encounter: Secondary | ICD-10-CM | POA: Diagnosis not present

## 2015-11-16 LAB — MULTIPLE MYELOMA PANEL, SERUM
ALBUMIN SERPL ELPH-MCNC: 3.2 g/dL (ref 2.9–4.4)
ALPHA 1: 0.2 g/dL (ref 0.0–0.4)
Albumin/Glob SerPl: 1.3 (ref 0.7–1.7)
Alpha2 Glob SerPl Elph-Mcnc: 0.9 g/dL (ref 0.4–1.0)
B-GLOBULIN SERPL ELPH-MCNC: 1.1 g/dL (ref 0.7–1.3)
GAMMA GLOB SERPL ELPH-MCNC: 0.4 g/dL (ref 0.4–1.8)
GLOBULIN, TOTAL: 2.6 g/dL (ref 2.2–3.9)
IgA: 165 mg/dL (ref 64–422)
IgG (Immunoglobin G), Serum: 441 mg/dL — ABNORMAL LOW (ref 700–1600)
IgM, Serum: 36 mg/dL (ref 26–217)
Total Protein ELP: 5.8 g/dL — ABNORMAL LOW (ref 6.0–8.5)

## 2015-11-21 ENCOUNTER — Encounter: Payer: Self-pay | Admitting: Internal Medicine

## 2015-11-21 ENCOUNTER — Inpatient Hospital Stay: Payer: Commercial Managed Care - HMO | Attending: Internal Medicine | Admitting: Internal Medicine

## 2015-11-21 DIAGNOSIS — M25562 Pain in left knee: Secondary | ICD-10-CM

## 2015-11-21 DIAGNOSIS — K219 Gastro-esophageal reflux disease without esophagitis: Secondary | ICD-10-CM | POA: Diagnosis not present

## 2015-11-21 DIAGNOSIS — M129 Arthropathy, unspecified: Secondary | ICD-10-CM | POA: Diagnosis not present

## 2015-11-21 DIAGNOSIS — Z7982 Long term (current) use of aspirin: Secondary | ICD-10-CM | POA: Diagnosis not present

## 2015-11-21 DIAGNOSIS — M25561 Pain in right knee: Secondary | ICD-10-CM

## 2015-11-21 DIAGNOSIS — E669 Obesity, unspecified: Secondary | ICD-10-CM

## 2015-11-21 DIAGNOSIS — M549 Dorsalgia, unspecified: Secondary | ICD-10-CM | POA: Insufficient documentation

## 2015-11-21 DIAGNOSIS — F329 Major depressive disorder, single episode, unspecified: Secondary | ICD-10-CM | POA: Diagnosis not present

## 2015-11-21 DIAGNOSIS — Z86711 Personal history of pulmonary embolism: Secondary | ICD-10-CM

## 2015-11-21 DIAGNOSIS — E785 Hyperlipidemia, unspecified: Secondary | ICD-10-CM | POA: Diagnosis not present

## 2015-11-21 DIAGNOSIS — N189 Chronic kidney disease, unspecified: Secondary | ICD-10-CM | POA: Insufficient documentation

## 2015-11-21 DIAGNOSIS — Z7901 Long term (current) use of anticoagulants: Secondary | ICD-10-CM | POA: Diagnosis not present

## 2015-11-21 DIAGNOSIS — F419 Anxiety disorder, unspecified: Secondary | ICD-10-CM | POA: Diagnosis not present

## 2015-11-21 DIAGNOSIS — C9110 Chronic lymphocytic leukemia of B-cell type not having achieved remission: Secondary | ICD-10-CM | POA: Diagnosis not present

## 2015-11-21 DIAGNOSIS — M1712 Unilateral primary osteoarthritis, left knee: Secondary | ICD-10-CM | POA: Diagnosis not present

## 2015-11-21 DIAGNOSIS — E7800 Pure hypercholesterolemia, unspecified: Secondary | ICD-10-CM | POA: Insufficient documentation

## 2015-11-21 DIAGNOSIS — Z86718 Personal history of other venous thrombosis and embolism: Secondary | ICD-10-CM | POA: Insufficient documentation

## 2015-11-21 DIAGNOSIS — Z79899 Other long term (current) drug therapy: Secondary | ICD-10-CM | POA: Insufficient documentation

## 2015-11-21 DIAGNOSIS — I129 Hypertensive chronic kidney disease with stage 1 through stage 4 chronic kidney disease, or unspecified chronic kidney disease: Secondary | ICD-10-CM | POA: Diagnosis not present

## 2015-11-21 LAB — FACTOR 5 LEIDEN

## 2015-11-21 LAB — COMP PANEL: LEUKEMIA/LYMPHOMA

## 2015-11-21 LAB — PROTHROMBIN GENE MUTATION

## 2015-11-21 NOTE — Progress Notes (Signed)
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      CLL (chronic lymphocytic leukemia) ([place])   11/21/2015 Initial Diagnosis    CLL (chronic lymphocytic leukemia) (HCC)       INTERVAL HISTORY:  A pleasant 75 year old female patient with above history of History of CLL- on surveillance; and history of bilateral PE- on Eliquis is here for follow-up.  Patient's biggest complaint is bilateral knee pain/ chronic arthritis. She is awaiting to see Dr. Jefm Bryant.   Patient breathing is improved. Denies any chest pain. She is not on oxygen. No bleeding.   REVIEW OF SYSTEMS:  A complete 10 point review of system is done which is negative except mentioned above/history of present illness.   PAST MEDICAL HISTORY :  Past Medical History:  Diagnosis Date  . Anxiety   . Arthritis   . Back pain   . Cancer ([place])    cll  . CLL (chronic lymphocytic leukemia) ([place])   . Depression   . GERD (gastroesophageal reflux disease)   . Hemorrhoids   . Hypercholesteremia   . Hyperlipidemia   . Hypertension   . 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 MGUS (monoclonal gammopathy of unknown significance)   . Obesity   . Pulmonary embolism ([place])     PAST SURGICAL HISTORY :   Past Surgical History:  Procedure Laterality Date  . BREAST BIOPSY Left 08/19/04   lt bx/clip-neg  . neck fusion    . PERIPHERAL VASCULAR  CATHETERIZATION N/A 08/15/2015   Procedure: pulmonary angiogram with lysis;  Surgeon: Katha Cabal, MD;  Location: [facility];  Service: Cardiovascular;  Laterality: N/A;  . WRIST SURGERY Left 2000    FAMILY HISTORY :   Family History  Problem Relation Age of Onset  . Heart failure Father   . Congestive Heart Failure Father   . Pulmonary embolism Mother   . Breast cancer Neg Hx     SOCIAL HISTORY:   Social History  Substance Use Topics  . Smoking status: Never Smoker  . Smokeless tobacco: Never Used  . Alcohol use No    ALLERGIES:  is allergic to oxycodone and morphine.  MEDICATIONS:  Current Outpatient Prescriptions  Medication Sig Dispense Refill  . acetaminophen (TYLENOL) 650 MG CR tablet Take 650 mg by mouth every 8 (eight) hours as needed for pain.    Marland Kitchen albuterol (PROVENTIL HFA;VENTOLIN HFA) 108 (90 Base) MCG/ACT inhaler Inhale 2 puffs into the lungs every 6 (six) hours as needed for wheezing or shortness of breath.    Marland Kitchen apixaban (ELIQUIS) 5 MG TABS tablet 5 mg twice a day 60 tablet 6  . aspirin 81 MG chewable tablet Chew 81 mg by mouth daily.    Marland Kitchen escitalopram (LEXAPRO) 10 MG tablet Take 10 mg by mouth daily.    Marland Kitchen gabapentin (NEURONTIN) 300 MG capsule Take 300 mg by mouth 3 (three) times daily.    Marland Kitchen HYDROcodone-acetaminophen (NORCO/VICODIN) 5-325  MG tablet Take 1 tablet by mouth every 6 (six) hours as needed for moderate pain.     Marland Kitchen losartan-hydrochlorothiazide (HYZAAR) 100-12.5 MG tablet Take 1 tablet by mouth daily. Reported on 08/22/2015    . Multiple Vitamin (MULTIVITAMIN WITH MINERALS) TABS tablet Take 1 tablet by mouth daily.    . Multiple Vitamins-Minerals (PRESERVISION AREDS 2 PO) Take 1 capsule by mouth 2 (two) times daily.     . pantoprazole (PROTONIX) 40 MG tablet Take 40 mg by mouth daily.    . pravastatin (PRAVACHOL) 40 MG tablet Take 40 mg by mouth daily.     . propranolol (INDERAL) 40 MG tablet Take 40 mg by mouth 2 (two) times daily. Reported  on 08/22/2015     No current facility-administered medications for this visit.     PHYSICAL EXAMINATION:   BP 131/83 (BP Location: Left Arm, Patient Position: Sitting)   Pulse 60   Temp (!) 95.5 F (35.3 C) (Tympanic)   Resp 18   Wt 282 lb 4 oz (128 kg)   BMI 46.97 kg/m   Filed Weights   11/21/15 0911  Weight: 282 lb 4 oz (128 kg)    GENERAL: Well-nourished well-developed; Alert, no distress and comfortable.   Obese. Walking with a rolling walker. Hyperventilating.  EYES: no pallor or icterus OROPHARYNX: no thrush or ulceration; good dentition  NECK: supple, no masses felt LYMPH:  no palpable lymphadenopathy in the cervical, axillary or inguinal regions LUNGS: clear to auscultation and  No wheeze or crackles HEART/CVS: regular rate & rhythm and no murmurs;  ABDOMEN:abdomen soft, non-tender and normal bowel sounds Musculoskeletal:no cyanosis of digits and no clubbing. Left lower extremity knee swollen. PSYCH: alert & oriented x 3 with fluent speech NEURO: no focal motor/sensory deficits SKIN:  Multiple ecchymosis noted.  LABORATORY DATA:  I have reviewed the data as listed    Component Value Date/Time   NA 140 11/14/2015 0949   NA 141 04/29/2012 1822   K 4.0 11/14/2015 0949   K 3.9 04/29/2012 1822   CL 104 11/14/2015 0949   CL 107 04/29/2012 1822   CO2 30 11/14/2015 0949   CO2 28 04/29/2012 1822   GLUCOSE 117 (H) 11/14/2015 0949   GLUCOSE 129 (H) 04/29/2012 1822   BUN 21 (H) 11/14/2015 0949   BUN 21 (H) 04/29/2012 1822   CREATININE 1.13 (H) 11/14/2015 0949   CREATININE 1.02 (H) 07/28/2014 0950   CALCIUM 9.2 11/14/2015 0949   CALCIUM 8.7 04/29/2012 1822   PROT 6.2 (L) 11/14/2015 0949   PROT 7.1 04/29/2012 1822   ALBUMIN 3.7 11/14/2015 0949   ALBUMIN 3.3 (L) 04/29/2012 1822   AST 15 11/14/2015 0949   AST 20 04/29/2012 1822   ALT 16 11/14/2015 0949   ALT 33 04/29/2012 1822   ALKPHOS 92 11/14/2015 0949   ALKPHOS 169 (H) 04/29/2012 1822   BILITOT 0.7  11/14/2015 0949   BILITOT 0.3 04/29/2012 1822   GFRNONAA 47 (L) 11/14/2015 0949   GFRNONAA 55 (L) 07/28/2014 0950   GFRAA 54 (L) 11/14/2015 0949   GFRAA >60 07/28/2014 0950    No results found for: SPEP, UPEP  Lab Results  Component Value Date   WBC 21.2 (H) 11/14/2015   NEUTROABS 3.8 11/14/2015   HGB 13.4 11/14/2015   HCT 40.7 11/14/2015   MCV 92.9 11/14/2015   PLT 218 11/14/2015      Chemistry      Component Value Date/Time   NA 140 11/14/2015 0949   NA  141 04/29/2012 1822   K 4.0 11/14/2015 0949   K 3.9 04/29/2012 1822   CL 104 11/14/2015 0949   CL 107 04/29/2012 1822   CO2 30 11/14/2015 0949   CO2 28 04/29/2012 1822   BUN 21 (H) 11/14/2015 0949   BUN 21 (H) 04/29/2012 1822   CREATININE 1.13 (H) 11/14/2015 0949   CREATININE 1.02 (H) 07/28/2014 0950      Component Value Date/Time   CALCIUM 9.2 11/14/2015 0949   CALCIUM 8.7 04/29/2012 1822   ALKPHOS 92 11/14/2015 0949   ALKPHOS 169 (H) 04/29/2012 1822   AST 15 11/14/2015 0949   AST 20 04/29/2012 1822   ALT 16 11/14/2015 0949   ALT 33 04/29/2012 1822   BILITOT 0.7 11/14/2015 0949   BILITOT 0.3 04/29/2012 1822        ASSESSMENT & PLAN:   CLL (chronic lymphocytic leukemia) ([place]) # Massive bilateral Pulmonary Embolus- s/p Thrombolysis on Eliquis- Unprovoked/ Unclear etiology.  Recommend indefinite anti-coagulation.   # Chronic low-grade B-cell lymphoproliferative disorder/CLL-  Stable ~ 20k. Monitor for now.   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 Present on Immunofix; Not quantifiable. Monitor for now.   # Chronic kidney disease creatinine 1.16/ stable.   # Bil knee pain- defer to [place].   # Patient follow-up with me in approximately 6 months up on labs.        Cammie Sickle, MD 11/21/2015 10:01 AM

## 2015-11-21 NOTE — Progress Notes (Signed)
Patient states she is having bilateral knee pain to the point that she is not sleeping. States she is seeing Dr. Jefm Bryant tomorrow. Went to ED on Sunday evening.  States she had CT scan.  No findings.

## 2015-11-21 NOTE — Assessment & Plan Note (Addendum)
#   Massive bilateral Pulmonary Embolus- s/p Thrombolysis on Eliquis- Unprovoked/ Unclear etiology.  Recommend indefinite anti-coagulation.   # Chronic low-grade B-cell lymphoproliferative disorder/CLL-  Stable ~ 20k. Monitor for now.   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 Present on Immunofix; Not quantifiable. Monitor for now.   # Chronic kidney disease creatinine 1.16/ stable.   # Bil knee pain- defer to [place].   # Patient follow-up with me in approximately 6 months up on labs.

## 2015-11-30 DIAGNOSIS — I872 Venous insufficiency (chronic) (peripheral): Secondary | ICD-10-CM | POA: Diagnosis not present

## 2015-11-30 DIAGNOSIS — F325 Major depressive disorder, single episode, in full remission: Secondary | ICD-10-CM | POA: Diagnosis not present

## 2015-11-30 DIAGNOSIS — R739 Hyperglycemia, unspecified: Secondary | ICD-10-CM | POA: Diagnosis not present

## 2015-11-30 DIAGNOSIS — I129 Hypertensive chronic kidney disease with stage 1 through stage 4 chronic kidney disease, or unspecified chronic kidney disease: Secondary | ICD-10-CM | POA: Diagnosis not present

## 2015-11-30 DIAGNOSIS — I87093 Postthrombotic syndrome with other complications of bilateral lower extremity: Secondary | ICD-10-CM | POA: Diagnosis not present

## 2015-11-30 DIAGNOSIS — N183 Chronic kidney disease, stage 3 (moderate): Secondary | ICD-10-CM | POA: Diagnosis not present

## 2015-11-30 DIAGNOSIS — Z6841 Body Mass Index (BMI) 40.0 and over, adult: Secondary | ICD-10-CM | POA: Diagnosis not present

## 2015-11-30 DIAGNOSIS — I2699 Other pulmonary embolism without acute cor pulmonale: Secondary | ICD-10-CM | POA: Diagnosis not present

## 2015-11-30 DIAGNOSIS — I890 Lymphedema, not elsewhere classified: Secondary | ICD-10-CM | POA: Diagnosis not present

## 2015-11-30 DIAGNOSIS — G4733 Obstructive sleep apnea (adult) (pediatric): Secondary | ICD-10-CM | POA: Diagnosis not present

## 2015-11-30 DIAGNOSIS — Z9989 Dependence on other enabling machines and devices: Secondary | ICD-10-CM | POA: Diagnosis not present

## 2015-11-30 DIAGNOSIS — M79609 Pain in unspecified limb: Secondary | ICD-10-CM | POA: Diagnosis not present

## 2015-11-30 DIAGNOSIS — M7989 Other specified soft tissue disorders: Secondary | ICD-10-CM | POA: Diagnosis not present

## 2015-12-19 DIAGNOSIS — M76892 Other specified enthesopathies of left lower limb, excluding foot: Secondary | ICD-10-CM | POA: Diagnosis not present

## 2015-12-19 DIAGNOSIS — M2392 Unspecified internal derangement of left knee: Secondary | ICD-10-CM | POA: Diagnosis not present

## 2015-12-19 DIAGNOSIS — M1712 Unilateral primary osteoarthritis, left knee: Secondary | ICD-10-CM | POA: Diagnosis not present

## 2015-12-27 DIAGNOSIS — M7050 Other bursitis of knee, unspecified knee: Secondary | ICD-10-CM | POA: Diagnosis not present

## 2015-12-27 DIAGNOSIS — M6281 Muscle weakness (generalized): Secondary | ICD-10-CM | POA: Diagnosis not present

## 2016-01-02 DIAGNOSIS — M7050 Other bursitis of knee, unspecified knee: Secondary | ICD-10-CM | POA: Diagnosis not present

## 2016-01-02 DIAGNOSIS — M6281 Muscle weakness (generalized): Secondary | ICD-10-CM | POA: Diagnosis not present

## 2016-01-03 ENCOUNTER — Other Ambulatory Visit: Payer: Self-pay | Admitting: Internal Medicine

## 2016-01-03 DIAGNOSIS — I2699 Other pulmonary embolism without acute cor pulmonale: Secondary | ICD-10-CM

## 2016-01-05 ENCOUNTER — Other Ambulatory Visit: Payer: Self-pay | Admitting: *Deleted

## 2016-01-05 NOTE — Patient Outreach (Addendum)
[facility]) Care Management  01/05/2016  Jamie Phillips 1940-11-30 [id number]   Subjective: Telephone call to patient's home number, no answer, left HIPAA compliant voicemail message for patient, and requested call back.   Objective: Per chart review: Patient has ED visit on 11/12/15 for shortness of breath.   Patient hospitalized 08/14/15 - 08/16/15 for shortness of breath, NSTEMI (non-ST elevated myocardial infarction), congestive heart failure, and respiratory failure.   Patient also has a history of hypertension, hyperlipidemia, and CLL (chronic lymphocytic leukemia).     Assessment: Received EMMI Prevent referral on 12/22/15.  Referral reason: Patient needs knee surgery, high risk due to weight, and will be starting physical therapy.  Telephone screen, pending patient contact.    Plan: RNCM will call patient for 2nd telephone outreach attempt, telephone screen, within 10 business days, if no return call.   Jamie Phillips H. Annia Friendly, BSN, [place] Management [facility] Telephonic CM Phone: [phone] Fax: [phone]

## 2016-01-08 ENCOUNTER — Encounter: Payer: Self-pay | Admitting: *Deleted

## 2016-01-08 ENCOUNTER — Other Ambulatory Visit: Payer: Self-pay | Admitting: *Deleted

## 2016-01-08 NOTE — Patient Outreach (Addendum)
[place] [facility]) Care Management  01/08/2016  Jamie Phillips 15-Apr-1941 [id number]  Subjective: Telephone call to patient's home number, spoke with patient, and HIPAA verified.  Discussed [facility] Care Management and patient in agreement to receive services.   Patient states she is very irritated right now due to a call she just received from [facility] mail order pharmacy regarding her Eliquis refill and it will cost her  $468 on her next refill.   States she can not afford that, usually pay around $100 for refills of this medication and she cancelled the refill of Eliquis due to cost.  States she was told by [employer] that she would be in the donut hole and that is why the cost of the medication had increases.  RNCM educated patient on the prescription donut hole, importance of Eliquis, discussing her concerns with MD prior discontinuing medication,  patient voices understanding, and states she will continuing taking medication.  States she has enough Eliquis for another month.  Patient in agreement to [name] referral for medication review, questions regarding Eliquis cost, donut hole options, other medication options for pain, and medication education.   Patient states she bilateral knee pain, it is bone on bone, needs knee replacement surgery, currently attending physical therapy, is on blood thinner due to blood clot in lungs (August 12, 2015) and has nothing ordered for pain except tylenol and she does not want to take any  "heavy narcotics".  States she needs surgery and surgeon will not do because of her weight. States the physical therapy was ordered by Dr. Skip Estimable and he wants her to loose weights.  States she has a follow up appointment with Dr. Marry Guan but does want to see him again for any services.  States the physical therapy is not working and it is increasing her pain.  States she has a physical therapy appointment on 01/09/16 and therapist is planning to follow up  with provider regarding surgery after physical therapy treatments options have been exhausted.  Patient states has also seen Dr. Leanor Kail for her knee issues and is willing to follow up with him for additional guidance regarding surgery. RNCM educated patient of the importance of care coordination, how providers manage high risk surgery, and patient communication with provider regarding quality of life goals.   Patient voiced understanding and is in agreement for Telephonic RNCM to follow for knee pain care coordination and pain management.   Patient states she has fallen 2-3 times over the last 3 months without injury due to knee pain.   Patient declined Community RNCM home safety evaluation at this time.  RNCM educated patient on fall prevention due to decreased mobility, patient voices understanding, states she will follow up with MD as needed , and continue with physical therapy for strengthening. regarding Patient states does not have any hypertension or cancer disease monitoring, disease educational needs at this time.  States she takes her blood pressure medications as prescribed and blood pressure has always been good at the MD's office.  States she does not have a blood pressure cuff and does not wish to obtain.  Patient will continue to receive [place] Management services.  Objective: Per chart review: Patient has ED visit on 11/12/15 for shortness of breath.   Patient hospitalized 08/14/15 - 08/16/15 for shortness of breath, NSTEMI (non-ST elevated myocardial infarction), congestive heart failure, and respiratory failure.   Patient also has a history of hypertension, hyperlipidemia, and CLL (chronic lymphocytic leukemia).  Assessment: Received EMMI Prevent referral on 12/22/15.  Referral reason: Patient needs knee surgery, high risk due to weight, and will be starting physical therapy.  Telephone screen completed.   Patient will be followed by [place] Management Telephonic [facility] for knee  pain care coordination.  Patient has also been referred to [place] for medication review and questions.    Plan: RNCM will refer patient to [place] referral for medication review, questions regarding Eliquis cost, donut hole options, other medication options for pain, and medication education.  RNCM will send patient successful outreach letter, [place] Management Welcome packet, and Advanced Directive packet.   RNCM will send case activation request to [place] [facility] at [place] Management.  RNCM will call patient within 10 business days to complete initial assessment and verify care coordinations needs.    Dotty Gonzalo H. Annia Friendly, BSN, [place] Management [facility] Telephonic CM Phone: [phone] Fax: [phone]

## 2016-01-09 DIAGNOSIS — M7050 Other bursitis of knee, unspecified knee: Secondary | ICD-10-CM | POA: Diagnosis not present

## 2016-01-15 ENCOUNTER — Ambulatory Visit: Payer: Self-pay | Admitting: *Deleted

## 2016-01-16 DIAGNOSIS — M7050 Other bursitis of knee, unspecified knee: Secondary | ICD-10-CM | POA: Diagnosis not present

## 2016-01-22 ENCOUNTER — Other Ambulatory Visit: Payer: Self-pay | Admitting: *Deleted

## 2016-01-22 NOTE — Patient Outreach (Signed)
[facility]) Care Management  01/22/2016  Jamie Phillips 10/05/1940 [id number]  Case transfer from [facility] 01/16/16.  Follow up call to patient who was advised of call & of [employer] care management services. [place] verification received from patient.   Patient voices that she is currently receiving outpatient physical therapy as recommended by her MD. States she has been limited in activity at therapy sessions because she has bone on bone in right knee. States MD has advised her that she has inflammation in tendons. States current orthopedist has referred her to [facility] orthopedist and she has appointment scheduled for Oct 10th.  States she plans to attend appointment & listen to MD recommendations and ask questions.   States she currently does not wish to have services of Community case Freight forwarder or telephone case manager at this time. . States if she decides to have surgery she may wish to talk to someone after she gets home.   States she is interested in talking with Pharmacist about being in donut hole.  Patient thanked RN CM for calling & states she has contact information if she decides she needs further services.   Referral to pharmacy is in place. Telephonic will sign off.    Sherrin Daisy, RN BSN [place] Management Coordinator [facility] Care Management  [phone]

## 2016-01-23 ENCOUNTER — Other Ambulatory Visit: Payer: Self-pay

## 2016-01-23 DIAGNOSIS — M7050 Other bursitis of knee, unspecified knee: Secondary | ICD-10-CM | POA: Diagnosis not present

## 2016-01-23 DIAGNOSIS — M159 Polyosteoarthritis, unspecified: Secondary | ICD-10-CM | POA: Diagnosis not present

## 2016-01-23 NOTE — Patient Outreach (Signed)
I tried calling the patient to discuss needs around Eliquis and affordability.  I had to leave a HIPPA compliant message for the patient to call me back.  If I do not hear back from the patient today, I will call the patient back at a later date.   Deanne Coffer, PharmD, [place] [phone]

## 2016-01-25 ENCOUNTER — Other Ambulatory Visit: Payer: Self-pay

## 2016-01-25 NOTE — Patient Outreach (Signed)
I received a phone call back from Ms. Luchetti in regards to her need with assistance for Eliquis.  I verified her PHI.  She stated she cannot afford her Eliquis.  She stated it usually cost her $125 from [employer].  She stated the next refill will be over $400.  I reviewed the qualifications for Eliquis patient assistance and she may qualify.  She does not know if she has spent 3% of her income to date.  She is going to contact [facility] to determine if she has.  She will call me back once she has determine how much she has spent out of pocket.  She will have to have spent $720 to qualify.  I have also asked her to contact her provider to see if they have any samples that will help her get through the rest of the year as well.  I will follow up in a few weeks if she does not call me back.    Deanne Coffer, PharmD, [place] [phone]

## 2016-01-30 DIAGNOSIS — M7050 Other bursitis of knee, unspecified knee: Secondary | ICD-10-CM | POA: Insufficient documentation

## 2016-01-30 DIAGNOSIS — M7051 Other bursitis of knee, right knee: Secondary | ICD-10-CM | POA: Diagnosis not present

## 2016-01-30 DIAGNOSIS — M1712 Unilateral primary osteoarthritis, left knee: Secondary | ICD-10-CM | POA: Diagnosis not present

## 2016-02-02 ENCOUNTER — Other Ambulatory Visit: Payer: Self-pay

## 2016-02-02 ENCOUNTER — Ambulatory Visit: Payer: Self-pay

## 2016-02-02 NOTE — Patient Outreach (Signed)
I called Jamie Phillips to follow up on to see if she was able to determine how much she had spent out of pocket on her medications.  I had to leave a HIPPA compliant message for the patient to call me back.  If I do not hear back from the patient today, I will call the patient back at a later date.   Deanne Coffer, PharmD, [place] [phone]

## 2016-02-06 ENCOUNTER — Ambulatory Visit: Payer: Self-pay

## 2016-02-13 ENCOUNTER — Other Ambulatory Visit: Payer: Self-pay | Admitting: Pharmacist

## 2016-02-13 NOTE — Patient Outreach (Signed)
[place] [facility]) Care Management  02/13/2016  MASINA RATZLOFF Aug 25, 1940 [id number]  [facility] Pharmacist made unsuccessful outreach call to patient to see if she determined her out-of-pocket prescription drug spend as [place], had previously spoken with patient about patient assistance with [place] for [employer].    No answer, left a HIPAA compliant voicemail requesting return call from patient.   Plan:  If no return call, will make another outreach attempt to patient within the next week.    Karrie Meres, PharmD, [place] [phone]

## 2016-02-15 ENCOUNTER — Other Ambulatory Visit: Payer: Self-pay | Admitting: Pharmacist

## 2016-02-15 NOTE — Patient Outreach (Signed)
[place] [facility]) Care Management  02/15/2016  Jamie Phillips 05-26-1940 [id number]  Successful phone outreach to patient. She verified HIPAA details.  Patient was referred to [place] for patient assistance evaluation for apixaban and was previously contacted by [facility] of Pharmacy, Arrie Aran, regarding [place] Patient [employer] requirements.   Today, patient reports she is no longer needing assistance with apixaban as she reports her PCP states she will have completed her therapy course and patient reports PCP is stopping medication.   Patient was counseled that she can contact [place] should she end up on apixaban longer and need help applying for manufacturer patient assistance.    Patient asked about the signs/symptoms of a blood clot and was counseled they can include but are not limited to:  Unusual swelling, pain, or redness/skin warmth of the leg, or unusual cough, chest pain, sudden shortness of breath.  She verbalized understanding.     She denies other pharmacy related questions/concerns.    Plan:  Will close case at this time.   Will send message to [place] assistant regarding case closure.    Karrie Meres, PharmD, [place] [phone]

## 2016-02-27 DIAGNOSIS — M1711 Unilateral primary osteoarthritis, right knee: Secondary | ICD-10-CM | POA: Diagnosis not present

## 2016-02-27 DIAGNOSIS — M1712 Unilateral primary osteoarthritis, left knee: Secondary | ICD-10-CM | POA: Diagnosis not present

## 2016-02-27 DIAGNOSIS — M7050 Other bursitis of knee, unspecified knee: Secondary | ICD-10-CM | POA: Diagnosis not present

## 2016-03-04 ENCOUNTER — Encounter (INDEPENDENT_AMBULATORY_CARE_PROVIDER_SITE_OTHER): Payer: Self-pay | Admitting: Vascular Surgery

## 2016-03-04 ENCOUNTER — Ambulatory Visit (INDEPENDENT_AMBULATORY_CARE_PROVIDER_SITE_OTHER): Payer: Commercial Managed Care - HMO | Admitting: Vascular Surgery

## 2016-03-04 DIAGNOSIS — I2609 Other pulmonary embolism with acute cor pulmonale: Secondary | ICD-10-CM

## 2016-03-04 DIAGNOSIS — M170 Bilateral primary osteoarthritis of knee: Secondary | ICD-10-CM | POA: Diagnosis not present

## 2016-03-04 DIAGNOSIS — M79604 Pain in right leg: Secondary | ICD-10-CM | POA: Diagnosis not present

## 2016-03-04 DIAGNOSIS — I872 Venous insufficiency (chronic) (peripheral): Secondary | ICD-10-CM | POA: Insufficient documentation

## 2016-03-04 DIAGNOSIS — I2782 Chronic pulmonary embolism: Secondary | ICD-10-CM

## 2016-03-04 DIAGNOSIS — M1990 Unspecified osteoarthritis, unspecified site: Secondary | ICD-10-CM | POA: Insufficient documentation

## 2016-03-04 DIAGNOSIS — M79609 Pain in unspecified limb: Secondary | ICD-10-CM | POA: Insufficient documentation

## 2016-03-04 DIAGNOSIS — M79605 Pain in left leg: Secondary | ICD-10-CM

## 2016-03-04 DIAGNOSIS — Z86718 Personal history of other venous thrombosis and embolism: Secondary | ICD-10-CM | POA: Insufficient documentation

## 2016-03-04 DIAGNOSIS — I82542 Chronic embolism and thrombosis of left tibial vein: Secondary | ICD-10-CM

## 2016-03-04 DIAGNOSIS — G8929 Other chronic pain: Secondary | ICD-10-CM | POA: Insufficient documentation

## 2016-03-04 NOTE — Progress Notes (Signed)
MRN : [id number]  Jamie Phillips is a 75 y.o. (07/14/1940) female who presents with chief complaint of  Chief Complaint  Patient presents with  . Re-evaluation    Evaluate for knee surgery  .  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  DVT was identified years ago and was treated with anticoagulation.  The presenting symptoms were pain and swelling in the lower extremity.  The patient notes the leg continues to be mildly painful with dependency and still swells quite a bite.  Symptoms are much better with elevation.  The patient notes minimal edema in the morning which steadily worsens throughout the day.    The patient has not been using compression therapy at this point.  No SOB or pleuritic chest pains.  No cough or hemoptysis.  No blood per rectum or blood in any sputum.  No excessive bruising per the patient.     Current Meds  Medication Sig  . acetaminophen (TYLENOL) 650 MG CR tablet Take 650 mg by mouth every 8 (eight) hours as needed for pain.  Marland Kitchen albuterol (PROVENTIL HFA;VENTOLIN HFA) 108 (90 Base) MCG/ACT inhaler Inhale 2 puffs into the lungs every 6 (six) hours as needed for wheezing or shortness of breath.  Marland Kitchen aspirin 81 MG chewable tablet Chew 81 mg by mouth daily.  Marland Kitchen ELIQUIS 5 MG TABS tablet TAKE 1 TABLET TWICE DAILY  . escitalopram (LEXAPRO) 10 MG tablet Take 10 mg by mouth daily.  Marland Kitchen gabapentin (NEURONTIN) 300 MG capsule Take 300 mg by mouth 3 (three) times daily.  Marland Kitchen losartan-hydrochlorothiazide (HYZAAR) 100-12.5 MG tablet Take 1 tablet by mouth daily. Reported on 08/22/2015  . Multiple Vitamin (MULTIVITAMIN WITH MINERALS) TABS tablet Take 1 tablet by mouth daily.  . Multiple Vitamins-Minerals (PRESERVISION AREDS 2 PO) Take 1 capsule by mouth 2 (two) times daily.   . pantoprazole (PROTONIX) 40 MG tablet Take 40 mg by mouth daily.  . pravastatin (PRAVACHOL) 40 MG  tablet Take 40 mg by mouth daily.   . propranolol (INDERAL) 40 MG tablet Take 40 mg by mouth 2 (two) times daily. Reported on 08/22/2015    Past Medical History:  Diagnosis Date  . Anxiety   . Arthritis   . Back pain   . Cancer ([place])    cll  . CLL (chronic lymphocytic leukemia) ([place])   . Depression   . GERD (gastroesophageal reflux disease)   . Hemorrhoids   . Hypercholesteremia   . Hyperlipidemia   . Hypertension   . 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 MGUS (monoclonal gammopathy of unknown significance)   . Obesity   . Pulmonary embolism [facility])     Past Surgical History:  Procedure Laterality Date  . BREAST BIOPSY Left 08/19/04   lt bx/clip-neg  . neck fusion    . PERIPHERAL VASCULAR CATHETERIZATION N/A 08/15/2015   Procedure: pulmonary angiogram with lysis;  Surgeon: Katha Cabal, MD;  Location: [facility];  Service: Cardiovascular;  Laterality: N/A;  . WRIST SURGERY Left 2000    Social History Social History  Substance Use Topics  . Smoking status: Never Smoker  . Smokeless tobacco: Never Used  . Alcohol use No    Family History Family History  Problem Relation Age of Onset  . Heart failure Father   . Congestive Heart Failure Father   . Pulmonary embolism Mother   .  Breast cancer Neg Hx   No family history of bleeding/clotting disorders, porphyria or autoimmune disease   Allergies  Allergen Reactions  . Oxycodone Other (See Comments)    Reaction:  Hallucinations  . Morphine Other (See Comments)    Reaction:  Hallucinations     REVIEW OF SYSTEMS (Negative unless checked)  Constitutional: [] Weight loss  [] Fever  [] Chills Cardiac: [] Chest pain   [] Chest pressure   [] Palpitations   [] Shortness of breath when laying flat   [] Shortness of breath with exertion. Vascular:  [] Pain in legs with walking   [] Pain in legs at rest  [x] History of DVT   [] Phlebitis   [] Swelling in legs   [] Varicose veins   [] Non-healing  ulcers Pulmonary:   [] Uses home oxygen   [] Productive cough   [] Hemoptysis   [] Wheeze  [] COPD   [] Asthma Neurologic:  [] Dizziness   [] Seizures   [] History of stroke   [] History of TIA  [] Aphasia   [] Vissual changes   [] Weakness or numbness in arm   [] Weakness or numbness in leg Musculoskeletal:   [x] Joint swelling   [x] Joint pain   [] Low back pain Hematologic:  [] Easy bruising  [] Easy bleeding   [] Hypercoagulable state   [] Anemic Gastrointestinal:  [] Diarrhea   [] Vomiting  [] Gastroesophageal reflux/heartburn   [] Difficulty swallowing. Genitourinary:  [] Chronic kidney disease   [] Difficult urination  [] Frequent urination   [] Blood in urine Skin:  [] Rashes   [] Ulcers  Psychological:  [] History of anxiety   []  History of major depression.  Physical Examination  Vitals:   03/04/16 0908  BP: (!) 171/84  Pulse: 65  Resp: 17  Weight: 288 lb (130.6 kg)  Height: 5\' 5"  (1.651 m)   Body mass index is 47.93 kg/m. Gen: WD/WN, NAD Head: /AT, No temporalis wasting.  Ear/Nose/Throat: Hearing grossly intact, nares w/o erythema or drainage, poor dentition Eyes: PER, EOMI, sclera nonicteric.  Neck: Supple, no masses.  No bruit or JVD.  Pulmonary:  Good air movement, clear to auscultation bilaterally, no use of accessory muscles.  Cardiac: RRR, normal S1, S2, no Murmurs. Vascular: mild venous changes with 2+ edema Vessel Right Left  Radial Palpable Palpable  Ulnar Palpable Palpable  Brachial Palpable Palpable  Carotid Palpable Palpable  Femoral Palpable Palpable  Popliteal Palpable Palpable  PT Palpable Palpable  DP Palpable Palpable   Gastrointestinal: soft, non-distended. No guarding/no peritoneal signs.  Musculoskeletal: M/S 5/5 throughout.  Marked degenerative deformity multiple joints, no atrophy.  Neurologic: CN 2-12 intact. Pain and light touch intact in extremities.  Symmetrical.  Speech is fluent. Motor exam as listed above. Psychiatric: Judgment intact, Mood & affect appropriate  for pt's clinical situation. Dermatologic: No rashes or ulcers noted.  No changes consistent with cellulitis. Lymph : No Cervical lymphadenopathy, no lichenification or skin changes of chronic lymphedema.  CBC Lab Results  Component Value Date   WBC 21.2 (H) 11/14/2015   HGB 13.4 11/14/2015   HCT 40.7 11/14/2015   MCV 92.9 11/14/2015   PLT 218 11/14/2015    BMET    Component Value Date/Time   NA 140 11/14/2015 0949   NA 141 04/29/2012 1822   K 4.0 11/14/2015 0949   K 3.9 04/29/2012 1822   CL 104 11/14/2015 0949   CL 107 04/29/2012 1822   CO2 30 11/14/2015 0949   CO2 28 04/29/2012 1822   GLUCOSE 117 (H) 11/14/2015 0949   GLUCOSE 129 (H) 04/29/2012 1822   BUN 21 (H) 11/14/2015 0949   BUN 21 (H) 04/29/2012 1822  CREATININE 1.13 (H) 11/14/2015 0949   CREATININE 1.02 (H) 07/28/2014 0950   CALCIUM 9.2 11/14/2015 0949   CALCIUM 8.7 04/29/2012 1822   GFRNONAA 47 (L) 11/14/2015 0949   GFRNONAA 55 (L) 07/28/2014 0950   GFRAA 54 (L) 11/14/2015 0949   GFRAA >60 07/28/2014 0950   CrCl cannot be calculated (Patient's most recent lab result is older than the maximum 21 days allowed.).  COAG Lab Results  Component Value Date   INR 1.14 08/14/2015    Radiology No results found.  Assessment/Plan 1. 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  IVC filter is strongly indicated prior to high risk orthopedic surgery.  Especially given the history of PE with the past DVT.  IVC filter placement will be done the week for surgery. Risk and benefits were reviewed the patient.  Indications for the procedure were reviewed.  All questions were answered, the patient agrees to proceed.   I have had a long discussion with the patient regarding DVT and post phlebitic changes such as swelling and why it  causes symptoms such as pain.  The patient will wear  graduated compression stockings class 1 (20-30 mmHg) on a daily basis a prescription was given. The patient will  beginning wearing the stockings first thing in the morning and removing them in the evening. The patient is instructed specifically not to sleep in the stockings.  In addition, behavioral modification including elevation during the day and avoidance of prolonged dependency will be initiated.    The patient will follow-up with me in 3 months after the joint replacement surgery to discuss removal (this was also discussed today and the patient agrees with the plan to have the filter removed).    2. 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 Primary osteoarthritis of both knees Continue NSAID medications as already ordered and reviewed, no changes at this time.  Await date of surgery  4. Pain in both lower extremities See #3  5. Venous insufficiency  No surgery or intervention at this point in time.    I have reviewed my previous discussion with the patient regarding swelling and why it  causes symptoms.  The patient is doing well with compression and will continue wearing graduated compression stockings class 1 (20-30 mmHg) on a daily basis a prescription was given. The patient will  beginning wearing the stockings first thing in the morning and removing them in the evening. The patient is instructed specifically not to sleep in the stockings.  In addition, behavioral modification including elevation during the day will be initiated.    Patient should follow-up on an annual basis for this problem    Hortencia Pilar, MD  03/04/2016 1:08 PM

## 2016-03-07 DIAGNOSIS — Z6841 Body Mass Index (BMI) 40.0 and over, adult: Secondary | ICD-10-CM | POA: Diagnosis not present

## 2016-03-07 DIAGNOSIS — Z9989 Dependence on other enabling machines and devices: Secondary | ICD-10-CM | POA: Diagnosis not present

## 2016-03-07 DIAGNOSIS — G4733 Obstructive sleep apnea (adult) (pediatric): Secondary | ICD-10-CM | POA: Diagnosis not present

## 2016-03-07 DIAGNOSIS — I2782 Chronic pulmonary embolism: Secondary | ICD-10-CM | POA: Diagnosis not present

## 2016-03-07 DIAGNOSIS — C9110 Chronic lymphocytic leukemia of B-cell type not having achieved remission: Secondary | ICD-10-CM | POA: Diagnosis not present

## 2016-03-28 DIAGNOSIS — N183 Chronic kidney disease, stage 3 (moderate): Secondary | ICD-10-CM | POA: Diagnosis not present

## 2016-03-28 DIAGNOSIS — I129 Hypertensive chronic kidney disease with stage 1 through stage 4 chronic kidney disease, or unspecified chronic kidney disease: Secondary | ICD-10-CM | POA: Diagnosis not present

## 2016-03-28 DIAGNOSIS — Z6841 Body Mass Index (BMI) 40.0 and over, adult: Secondary | ICD-10-CM | POA: Diagnosis not present

## 2016-03-28 DIAGNOSIS — R739 Hyperglycemia, unspecified: Secondary | ICD-10-CM | POA: Diagnosis not present

## 2016-04-04 DIAGNOSIS — G4733 Obstructive sleep apnea (adult) (pediatric): Secondary | ICD-10-CM | POA: Diagnosis not present

## 2016-04-04 DIAGNOSIS — I129 Hypertensive chronic kidney disease with stage 1 through stage 4 chronic kidney disease, or unspecified chronic kidney disease: Secondary | ICD-10-CM | POA: Diagnosis not present

## 2016-04-04 DIAGNOSIS — E7800 Pure hypercholesterolemia, unspecified: Secondary | ICD-10-CM | POA: Diagnosis not present

## 2016-04-04 DIAGNOSIS — I2782 Chronic pulmonary embolism: Secondary | ICD-10-CM | POA: Diagnosis not present

## 2016-04-04 DIAGNOSIS — F325 Major depressive disorder, single episode, in full remission: Secondary | ICD-10-CM | POA: Diagnosis not present

## 2016-04-04 DIAGNOSIS — Z0000 Encounter for general adult medical examination without abnormal findings: Secondary | ICD-10-CM | POA: Diagnosis not present

## 2016-04-04 DIAGNOSIS — R739 Hyperglycemia, unspecified: Secondary | ICD-10-CM | POA: Diagnosis not present

## 2016-04-04 DIAGNOSIS — C9110 Chronic lymphocytic leukemia of B-cell type not having achieved remission: Secondary | ICD-10-CM | POA: Diagnosis not present

## 2016-05-21 DIAGNOSIS — M1712 Unilateral primary osteoarthritis, left knee: Secondary | ICD-10-CM | POA: Diagnosis not present

## 2016-05-29 ENCOUNTER — Other Ambulatory Visit (INDEPENDENT_AMBULATORY_CARE_PROVIDER_SITE_OTHER): Payer: Self-pay

## 2016-06-03 DIAGNOSIS — G4730 Sleep apnea, unspecified: Secondary | ICD-10-CM | POA: Diagnosis not present

## 2016-06-03 DIAGNOSIS — G4733 Obstructive sleep apnea (adult) (pediatric): Secondary | ICD-10-CM | POA: Diagnosis not present

## 2016-06-03 DIAGNOSIS — M1712 Unilateral primary osteoarthritis, left knee: Secondary | ICD-10-CM | POA: Diagnosis not present

## 2016-06-03 DIAGNOSIS — Z01810 Encounter for preprocedural cardiovascular examination: Secondary | ICD-10-CM | POA: Diagnosis not present

## 2016-06-03 DIAGNOSIS — I10 Essential (primary) hypertension: Secondary | ICD-10-CM | POA: Diagnosis not present

## 2016-06-10 ENCOUNTER — Other Ambulatory Visit (INDEPENDENT_AMBULATORY_CARE_PROVIDER_SITE_OTHER): Payer: Self-pay

## 2016-06-10 MED ORDER — CEFAZOLIN IN D5W 1 GM/50ML IV SOLN: 1.0000 g | Freq: Once | INTRAVENOUS | Status: DC

## 2016-06-11 ENCOUNTER — Encounter
Admission: RE | Disposition: A | Discharge status: Home / Self Care | Payer: Self-pay | Source: Ambulatory Visit | Attending: Vascular Surgery

## 2016-06-11 ENCOUNTER — Ambulatory Visit
Admission: RE | Admit: 2016-06-11 | Discharge: 2016-06-11 | Disposition: A | Discharge status: Home / Self Care | Payer: Medicare HMO | Source: Ambulatory Visit | Attending: Vascular Surgery | Admitting: Vascular Surgery

## 2016-06-11 DIAGNOSIS — I872 Venous insufficiency (chronic) (peripheral): Secondary | ICD-10-CM | POA: Diagnosis not present

## 2016-06-11 DIAGNOSIS — K219 Gastro-esophageal reflux disease without esophagitis: Secondary | ICD-10-CM | POA: Diagnosis not present

## 2016-06-11 DIAGNOSIS — Z7982 Long term (current) use of aspirin: Secondary | ICD-10-CM | POA: Diagnosis not present

## 2016-06-11 DIAGNOSIS — Z8249 Family history of ischemic heart disease and other diseases of the circulatory system: Secondary | ICD-10-CM | POA: Diagnosis not present

## 2016-06-11 DIAGNOSIS — E669 Obesity, unspecified: Secondary | ICD-10-CM | POA: Diagnosis not present

## 2016-06-11 DIAGNOSIS — F329 Major depressive disorder, single episode, unspecified: Secondary | ICD-10-CM | POA: Insufficient documentation

## 2016-06-11 DIAGNOSIS — E7800 Pure hypercholesterolemia, unspecified: Secondary | ICD-10-CM | POA: Insufficient documentation

## 2016-06-11 DIAGNOSIS — Z9889 Other specified postprocedural states: Secondary | ICD-10-CM | POA: Insufficient documentation

## 2016-06-11 DIAGNOSIS — Z6841 Body Mass Index (BMI) 40.0 and over, adult: Secondary | ICD-10-CM | POA: Insufficient documentation

## 2016-06-11 DIAGNOSIS — Z7901 Long term (current) use of anticoagulants: Secondary | ICD-10-CM | POA: Diagnosis not present

## 2016-06-11 DIAGNOSIS — Z885 Allergy status to narcotic agent status: Secondary | ICD-10-CM | POA: Diagnosis not present

## 2016-06-11 DIAGNOSIS — I82442 Acute embolism and thrombosis of left tibial vein: Secondary | ICD-10-CM | POA: Insufficient documentation

## 2016-06-11 DIAGNOSIS — Z981 Arthrodesis status: Secondary | ICD-10-CM | POA: Diagnosis not present

## 2016-06-11 DIAGNOSIS — I2699 Other pulmonary embolism without acute cor pulmonale: Secondary | ICD-10-CM | POA: Diagnosis not present

## 2016-06-11 DIAGNOSIS — I2609 Other pulmonary embolism with acute cor pulmonale: Secondary | ICD-10-CM | POA: Insufficient documentation

## 2016-06-11 DIAGNOSIS — I82409 Acute embolism and thrombosis of unspecified deep veins of unspecified lower extremity: Secondary | ICD-10-CM | POA: Diagnosis not present

## 2016-06-11 DIAGNOSIS — F419 Anxiety disorder, unspecified: Secondary | ICD-10-CM | POA: Insufficient documentation

## 2016-06-11 DIAGNOSIS — M170 Bilateral primary osteoarthritis of knee: Secondary | ICD-10-CM | POA: Insufficient documentation

## 2016-06-11 DIAGNOSIS — I10 Essential (primary) hypertension: Secondary | ICD-10-CM | POA: Diagnosis not present

## 2016-06-11 DIAGNOSIS — Z01818 Encounter for other preprocedural examination: Secondary | ICD-10-CM | POA: Diagnosis not present

## 2016-06-11 HISTORY — PX: IVC FILTER INSERTION: CATH118245

## 2016-06-11 SURGERY — IVC FILTER INSERTION
Anesthesia: Moderate Sedation

## 2016-06-11 MED ORDER — IOPAMIDOL (ISOVUE-300) INJECTION 61%
INTRAVENOUS | Status: DC | PRN
  Administered 2016-06-11: 15 mL via INTRA_ARTERIAL

## 2016-06-11 MED ORDER — HEPARIN SODIUM (PORCINE) 1000 UNIT/ML IJ SOLN
INTRAMUSCULAR | Status: AC
  Filled 2016-06-11: qty 1

## 2016-06-11 MED ORDER — LIDOCAINE HCL (PF) 1 % IJ SOLN
INTRAMUSCULAR | Status: AC
  Filled 2016-06-11: qty 30

## 2016-06-11 MED ORDER — SODIUM CHLORIDE 0.9 % IV SOLN: INTRAVENOUS | Status: DC

## 2016-06-11 MED ORDER — CEFAZOLIN IN D5W 1 GM/50ML IV SOLN
INTRAVENOUS | Status: AC
  Filled 2016-06-11: qty 50

## 2016-06-11 MED ORDER — MIDAZOLAM HCL 2 MG/2ML IJ SOLN
INTRAMUSCULAR | Status: DC | PRN
  Administered 2016-06-11: 2 mg via INTRAVENOUS
  Administered 2016-06-11: 1 mg via INTRAVENOUS

## 2016-06-11 MED ORDER — SODIUM CHLORIDE 0.9 % IV SOLN
INTRAVENOUS | Status: DC
  Administered 2016-06-11: 20 mL/h via INTRAVENOUS

## 2016-06-11 MED ORDER — HEPARIN (PORCINE) IN NACL 2-0.9 UNIT/ML-% IJ SOLN
INTRAMUSCULAR | Status: AC
  Filled 2016-06-11: qty 500

## 2016-06-11 MED ORDER — FENTANYL CITRATE (PF) 100 MCG/2ML IJ SOLN
INTRAMUSCULAR | Status: AC
  Filled 2016-06-11: qty 2

## 2016-06-11 MED ORDER — CEFAZOLIN IN D5W 1 GM/50ML IV SOLN
1.0000 g | Freq: Once | INTRAVENOUS | Status: AC
  Administered 2016-06-11: 1 g via INTRAVENOUS

## 2016-06-11 MED ORDER — FENTANYL CITRATE (PF) 100 MCG/2ML IJ SOLN
INTRAMUSCULAR | Status: DC | PRN
  Administered 2016-06-11 (×2): 50 ug via INTRAVENOUS

## 2016-06-11 MED ORDER — MIDAZOLAM HCL 5 MG/5ML IJ SOLN
INTRAMUSCULAR | Status: AC
  Filled 2016-06-11: qty 5

## 2016-06-11 SURGICAL SUPPLY — 6 items
GUIDEWIRE J TIP .035 (WIRE) ×2 IMPLANT
KIT FEMORAL DEL DENALI (Miscellaneous) ×2 IMPLANT
NDL ENTRY 21GA 7CM ECHOTIP (NEEDLE) IMPLANT
NEEDLE ENTRY 21GA 7CM ECHOTIP (NEEDLE) ×3 IMPLANT
PACK ANGIOGRAPHY (CUSTOM PROCEDURE TRAY) ×2 IMPLANT
SET INTRO CAPELLA COAXIAL (SET/KITS/TRAYS/PACK) ×2 IMPLANT

## 2016-06-11 NOTE — Progress Notes (Signed)
Patient is alert and oriented. Reporting no pain. Bedrest over at 1130, patient has ambulated to BR since termination of bedrest with no complications. Right groin site CDI. Discharge instructions reviewed with patient and family. Awaiting MD visit to patient before discharge.

## 2016-06-11 NOTE — Op Note (Signed)
[facility]   OPERATIVE NOTE    PRE-OPERATIVE DIAGNOSIS: DVT with PE  POST-OPERATIVE DIAGNOSIS: Same  PROCEDURE: 1.   Ultrasound guidance for vascular access to the right common femoral vein 2.   Catheter placement into the inferior vena cava 3.   Inferior venacavogram 4.   Placement of a [place] IVC filter  SURGEON: Hortencia Pilar  ASSISTANT(S): None  ANESTHESIA: Conscious sedation was administered under my direct supervision. IV Versed plus fentanyl were utilized. Continuous ECG, pulse oximetry and blood pressure was monitored throughout the entire procedure. Conscious sedation was for a total of 20 minutes.  ESTIMATED BLOOD LOSS: minimal  FINDING(S): 1.  Patent IVC  SPECIMEN(S):  none  INDICATIONS:   Jamie Phillips is a 76 y.o. y.o. female who presents with history of saddle pulmonary embolism associated with DVT.  She underwent successful pulmonary thrombolysis in April 2017. She is now scheduled for orthopedic surgery and is at high risk for recurrent pulmonary embolism perioperatively.  Inferior vena cava filter is indicated for this reason.  Risks and benefits including filter thrombosis, migration, fracture, bleeding, and infection were all discussed.  We discussed that all IVC filters that we place can be removed if desired from the patient once the need for the filter has passed.    DESCRIPTION: After obtaining full informed written consent, the patient was brought back to the vascular suite. The skin was sterilely prepped and draped in a sterile surgical field was created. The right common femoral vein was accessed under direct ultrasound guidance without difficulty with a Seldinger needle and a J-wire was then placed. The dilator is passed over the wire and the delivery sheath was placed into the inferior vena cava.  Inferior venacavogram was performed. This demonstrated a patent IVC with the level of the renal veins at L1.  The filter was then  deployed into the inferior vena cava at the level of L2-L3 just below the renal veins. The delivery sheath was then removed. Pressure was held. Sterile dressings were placed. The patient tolerated the procedure well and was taken to the recovery room in stable condition.  Interpretation: IVC widely patent measuring approximately 18 mm.  Infrarenal filter in excellent position  COMPLICATIONS: None  CONDITION: Stable  Hortencia Pilar  06/11/2016, 9:29 AM

## 2016-06-11 NOTE — H&P (Signed)
MRN : [id number]  Jamie Phillips is a 76 y.o. (1940-11-15) female who presents with chief complaint of      Chief Complaint  Patient presents with  . Re-evaluation    Evaluate for knee surgery  .  History of Present Illness: The patient presents today for insertion of her IVC filter prior to joint surgery.  She was seen by Dr Jefm Bryant and is now moving forward with surgery.  Past evaluation showed prior DVT in association with PE.  DVT was identified years ago and was treated with anticoagulation.  The presenting symptoms were pain and swelling in the lower extremity and SOB.  Last April she had successful thrombectomy of the PE and has done well.  The patient notes the leg continues to be mildly painful with dependency and still swells quite a bite.  Symptoms are much better with elevation.  The patient notes minimal edema in the morning which steadily worsens throughout the day.    The patient has not been using compression therapy at this point.  No SOB or pleuritic chest pains.  No cough or hemoptysis.  No blood per rectum or blood in any sputum.  No excessive bruising per the patient.     ActiveMedications      Current Meds  Medication Sig  . acetaminophen (TYLENOL) 650 MG CR tablet Take 650 mg by mouth every 8 (eight) hours as needed for pain.  Marland Kitchen albuterol (PROVENTIL HFA;VENTOLIN HFA) 108 (90 Base) MCG/ACT inhaler Inhale 2 puffs into the lungs every 6 (six) hours as needed for wheezing or shortness of breath.  Marland Kitchen aspirin 81 MG chewable tablet Chew 81 mg by mouth daily.  Marland Kitchen ELIQUIS 5 MG TABS tablet TAKE 1 TABLET TWICE DAILY  . escitalopram (LEXAPRO) 10 MG tablet Take 10 mg by mouth daily.  Marland Kitchen gabapentin (NEURONTIN) 300 MG capsule Take 300 mg by mouth 3 (three) times daily.  Marland Kitchen losartan-hydrochlorothiazide (HYZAAR) 100-12.5 MG tablet Take 1 tablet by mouth daily. Reported on 08/22/2015  . Multiple Vitamin (MULTIVITAMIN WITH MINERALS) TABS tablet Take 1 tablet by mouth  daily.  . Multiple Vitamins-Minerals (PRESERVISION AREDS 2 PO) Take 1 capsule by mouth 2 (two) times daily.   . pantoprazole (PROTONIX) 40 MG tablet Take 40 mg by mouth daily.  . pravastatin (PRAVACHOL) 40 MG tablet Take 40 mg by mouth daily.   . propranolol (INDERAL) 40 MG tablet Take 40 mg by mouth 2 (two) times daily. Reported on 08/22/2015          Past Medical History:  Diagnosis Date  . Anxiety   . Arthritis   . Back pain   . Cancer ([place])    cll  . CLL (chronic lymphocytic leukemia) ([place])   . Depression   . GERD (gastroesophageal reflux disease)   . Hemorrhoids   . Hypercholesteremia   . Hyperlipidemia   . Hypertension   . 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 MGUS (monoclonal gammopathy of unknown significance)   . Obesity   . Pulmonary embolism [facility])          Past Surgical History:  Procedure Laterality Date  . BREAST BIOPSY Left 08/19/04   lt bx/clip-neg  . neck fusion    . PERIPHERAL VASCULAR CATHETERIZATION N/A 08/15/2015   Procedure: pulmonary angiogram with lysis;  Surgeon: Katha Cabal, MD;  Location: [facility];  Service: Cardiovascular;  Laterality: N/A;  . WRIST SURGERY Left 2000    Social History  Social History  Substance Use Topics  . Smoking status: Never Smoker  . Smokeless tobacco: Never Used  . Alcohol use No    Family History      Family History  Problem Relation Age of Onset  . Heart failure Father   . Congestive Heart Failure Father   . Pulmonary embolism Mother   . Breast cancer Neg Hx   No family history of bleeding/clotting disorders, porphyria or autoimmune disease        Allergies  Allergen Reactions  . Oxycodone Other (See Comments)    Reaction:  Hallucinations  . Morphine Other (See Comments)    Reaction:  Hallucinations     REVIEW OF SYSTEMS (Negative unless checked)  Constitutional: [] Weight loss  [] Fever  [] Chills Cardiac: [] Chest pain    [] Chest pressure   [] Palpitations   [] Shortness of breath when laying flat   [] Shortness of breath with exertion. Vascular:  [] Pain in legs with walking   [] Pain in legs at rest  [x] History of DVT   [] Phlebitis   [] Swelling in legs   [] Varicose veins   [] Non-healing ulcers Pulmonary:   [] Uses home oxygen   [] Productive cough   [] Hemoptysis   [] Wheeze  [] COPD   [] Asthma Neurologic:  [] Dizziness   [] Seizures   [] History of stroke   [] History of TIA  [] Aphasia   [] Vissual changes   [] Weakness or numbness in arm   [] Weakness or numbness in leg Musculoskeletal:   [x] Joint swelling   [x] Joint pain   [] Low back pain Hematologic:  [] Easy bruising  [] Easy bleeding   [] Hypercoagulable state   [] Anemic Gastrointestinal:  [] Diarrhea   [] Vomiting  [] Gastroesophageal reflux/heartburn   [] Difficulty swallowing. Genitourinary:  [] Chronic kidney disease   [] Difficult urination  [] Frequent urination   [] Blood in urine Skin:  [] Rashes   [] Ulcers  Psychological:  [] History of anxiety   []  History of major depression.  Physical Examination     Vitals:   03/04/16 0908  BP: (!) 171/84  Pulse: 65  Resp: 17  Weight: 288 lb (130.6 kg)  Height: 5\' 5"  (1.651 m)   Body mass index is 47.93 kg/m. Gen: WD/WN, NAD Head: [place]/AT, No temporalis wasting.  Ear/Nose/Throat: Hearing grossly intact, nares w/o erythema or drainage, poor dentition Eyes: PER, EOMI, sclera nonicteric.  Neck: Supple, no masses.  No bruit or JVD.  Pulmonary:  Good air movement, clear to auscultation bilaterally, no use of accessory muscles.  Cardiac: RRR, normal S1, S2, no Murmurs. Vascular: mild venous changes with 2+ edema Vessel Right Left  Radial Palpable Palpable  Ulnar Palpable Palpable  Brachial Palpable Palpable  Carotid Palpable Palpable  Femoral Palpable Palpable  Popliteal Palpable Palpable  PT Palpable Palpable  DP Palpable Palpable   Gastrointestinal: soft, non-distended. No guarding/no peritoneal signs.    Musculoskeletal: M/S 5/5 throughout.  Marked degenerative deformity multiple joints, no atrophy.  Neurologic: CN 2-12 intact. Pain and light touch intact in extremities.  Symmetrical.  Speech is fluent. Motor exam as listed above. Psychiatric: Judgment intact, Mood & affect appropriate for pt's clinical situation. Dermatologic: No rashes or ulcers noted.  No changes consistent with cellulitis. Lymph : No Cervical lymphadenopathy, no lichenification or skin changes of chronic lymphedema.  CBC RecentLabs       Lab Results  Component Value Date   WBC 21.2 (H) 11/14/2015   HGB 13.4 11/14/2015   HCT 40.7 11/14/2015   MCV 92.9 11/14/2015   PLT 218 11/14/2015      BMET Labs(Brief)  Component Value Date/Time   NA 140 11/14/2015 0949   NA 141 04/29/2012 1822   K 4.0 11/14/2015 0949   K 3.9 04/29/2012 1822   CL 104 11/14/2015 0949   CL 107 04/29/2012 1822   CO2 30 11/14/2015 0949   CO2 28 04/29/2012 1822   GLUCOSE 117 (H) 11/14/2015 0949   GLUCOSE 129 (H) 04/29/2012 1822   BUN 21 (H) 11/14/2015 0949   BUN 21 (H) 04/29/2012 1822   CREATININE 1.13 (H) 11/14/2015 0949   CREATININE 1.02 (H) 07/28/2014 0950   CALCIUM 9.2 11/14/2015 0949   CALCIUM 8.7 04/29/2012 1822   GFRNONAA 47 (L) 11/14/2015 0949   GFRNONAA 55 (L) 07/28/2014 0950   GFRAA 54 (L) 11/14/2015 0949   GFRAA >60 07/28/2014 0950     CrCl cannot be calculated (Patient's most recent lab result is older than the maximum 21 days allowed.).  COAG RecentLabs       Lab Results  Component Value Date   INR 1.14 08/14/2015      Radiology ImagingResults  No results found.    Assessment/Plan 1. 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  IVC filter is strongly indicated prior to high risk orthopedic surgery.  Especially given the  history of PE with the past DVT.  IVC filter placement will be done the week for surgery. Risk and benefits were reviewed the patient.  Indications for the procedure were reviewed.  All questions were answered, the patient agrees to proceed.   I have had a long discussion with the patient regarding DVT and post phlebitic changes such as swelling and why it  causes symptoms such as pain.  The patient will wear graduated compression stockings class 1 (20-30 mmHg) on a daily basis a prescription was given. The patient will  beginning wearing the stockings first thing in the morning and removing them in the evening. The patient is instructed specifically not to sleep in the stockings.  In addition, behavioral modification including elevation during the day and avoidance of prolonged dependency will be initiated.    The patient will follow-up with me in 3 months after the joint replacement surgery to discuss removal (this was also discussed today and the patient agrees with the plan to have the filter removed).    2. 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 Primary osteoarthritis of both knees Continue NSAID medications as already ordered and reviewed, no changes at this time.  Await date of surgery  4. Pain in both lower extremities See #3  5. Venous insufficiency  No surgery or intervention at this point in time.    I have reviewed my previous discussion with the patient regarding swelling and why it  causes symptoms.  The patient is doing well with compression and will continue wearing graduated compression stockings class 1 (20-30 mmHg) on a daily basis a prescription was given. The patient will  beginning wearing the stockings first thing in the morning and removing them in the evening. The patient is instructed specifically not to sleep in the stockings.  In addition, behavioral modification including elevation during the day will be  initiated.    Patient should follow-up on an annual basis for this problem    Hortencia Pilar, MD  06/11/2016 8:48 AM

## 2016-06-12 ENCOUNTER — Encounter: Payer: Self-pay | Admitting: Vascular Surgery

## 2016-06-17 DIAGNOSIS — M25562 Pain in left knee: Secondary | ICD-10-CM | POA: Diagnosis not present

## 2016-06-17 DIAGNOSIS — Z86718 Personal history of other venous thrombosis and embolism: Secondary | ICD-10-CM | POA: Diagnosis not present

## 2016-06-17 DIAGNOSIS — Z7901 Long term (current) use of anticoagulants: Secondary | ICD-10-CM | POA: Diagnosis not present

## 2016-06-17 DIAGNOSIS — L299 Pruritus, unspecified: Secondary | ICD-10-CM | POA: Diagnosis not present

## 2016-06-17 DIAGNOSIS — K219 Gastro-esophageal reflux disease without esophagitis: Secondary | ICD-10-CM | POA: Diagnosis not present

## 2016-06-17 DIAGNOSIS — F419 Anxiety disorder, unspecified: Secondary | ICD-10-CM | POA: Diagnosis not present

## 2016-06-17 DIAGNOSIS — F418 Other specified anxiety disorders: Secondary | ICD-10-CM | POA: Diagnosis not present

## 2016-06-17 DIAGNOSIS — M1712 Unilateral primary osteoarthritis, left knee: Secondary | ICD-10-CM | POA: Diagnosis not present

## 2016-06-17 DIAGNOSIS — I10 Essential (primary) hypertension: Secondary | ICD-10-CM | POA: Diagnosis not present

## 2016-06-17 DIAGNOSIS — D72820 Lymphocytosis (symptomatic): Secondary | ICD-10-CM | POA: Diagnosis not present

## 2016-06-17 DIAGNOSIS — G8918 Other acute postprocedural pain: Secondary | ICD-10-CM | POA: Diagnosis not present

## 2016-06-17 DIAGNOSIS — N189 Chronic kidney disease, unspecified: Secondary | ICD-10-CM | POA: Diagnosis not present

## 2016-06-17 DIAGNOSIS — I129 Hypertensive chronic kidney disease with stage 1 through stage 4 chronic kidney disease, or unspecified chronic kidney disease: Secondary | ICD-10-CM | POA: Diagnosis not present

## 2016-06-17 DIAGNOSIS — G4733 Obstructive sleep apnea (adult) (pediatric): Secondary | ICD-10-CM | POA: Diagnosis not present

## 2016-06-17 HISTORY — PX: JOINT REPLACEMENT: SHX530

## 2016-06-21 DIAGNOSIS — Z96652 Presence of left artificial knee joint: Secondary | ICD-10-CM | POA: Diagnosis not present

## 2016-06-21 DIAGNOSIS — N189 Chronic kidney disease, unspecified: Secondary | ICD-10-CM | POA: Diagnosis not present

## 2016-06-21 DIAGNOSIS — M1711 Unilateral primary osteoarthritis, right knee: Secondary | ICD-10-CM | POA: Diagnosis not present

## 2016-06-21 DIAGNOSIS — M5090 Cervical disc disorder, unspecified, unspecified cervical region: Secondary | ICD-10-CM | POA: Diagnosis not present

## 2016-06-21 DIAGNOSIS — I739 Peripheral vascular disease, unspecified: Secondary | ICD-10-CM | POA: Diagnosis not present

## 2016-06-21 DIAGNOSIS — Z471 Aftercare following joint replacement surgery: Secondary | ICD-10-CM | POA: Diagnosis not present

## 2016-06-21 DIAGNOSIS — I129 Hypertensive chronic kidney disease with stage 1 through stage 4 chronic kidney disease, or unspecified chronic kidney disease: Secondary | ICD-10-CM | POA: Diagnosis not present

## 2016-06-24 DIAGNOSIS — M1711 Unilateral primary osteoarthritis, right knee: Secondary | ICD-10-CM | POA: Diagnosis not present

## 2016-06-24 DIAGNOSIS — I129 Hypertensive chronic kidney disease with stage 1 through stage 4 chronic kidney disease, or unspecified chronic kidney disease: Secondary | ICD-10-CM | POA: Diagnosis not present

## 2016-06-24 DIAGNOSIS — N189 Chronic kidney disease, unspecified: Secondary | ICD-10-CM | POA: Diagnosis not present

## 2016-06-24 DIAGNOSIS — I739 Peripheral vascular disease, unspecified: Secondary | ICD-10-CM | POA: Diagnosis not present

## 2016-06-24 DIAGNOSIS — Z471 Aftercare following joint replacement surgery: Secondary | ICD-10-CM | POA: Diagnosis not present

## 2016-06-24 DIAGNOSIS — M5090 Cervical disc disorder, unspecified, unspecified cervical region: Secondary | ICD-10-CM | POA: Diagnosis not present

## 2016-06-26 DIAGNOSIS — N189 Chronic kidney disease, unspecified: Secondary | ICD-10-CM | POA: Diagnosis not present

## 2016-06-26 DIAGNOSIS — Z471 Aftercare following joint replacement surgery: Secondary | ICD-10-CM | POA: Diagnosis not present

## 2016-06-26 DIAGNOSIS — I739 Peripheral vascular disease, unspecified: Secondary | ICD-10-CM | POA: Diagnosis not present

## 2016-06-26 DIAGNOSIS — M1711 Unilateral primary osteoarthritis, right knee: Secondary | ICD-10-CM | POA: Diagnosis not present

## 2016-06-26 DIAGNOSIS — I129 Hypertensive chronic kidney disease with stage 1 through stage 4 chronic kidney disease, or unspecified chronic kidney disease: Secondary | ICD-10-CM | POA: Diagnosis not present

## 2016-06-26 DIAGNOSIS — M5090 Cervical disc disorder, unspecified, unspecified cervical region: Secondary | ICD-10-CM | POA: Diagnosis not present

## 2016-06-28 DIAGNOSIS — M1711 Unilateral primary osteoarthritis, right knee: Secondary | ICD-10-CM | POA: Diagnosis not present

## 2016-06-28 DIAGNOSIS — M5090 Cervical disc disorder, unspecified, unspecified cervical region: Secondary | ICD-10-CM | POA: Diagnosis not present

## 2016-06-28 DIAGNOSIS — I739 Peripheral vascular disease, unspecified: Secondary | ICD-10-CM | POA: Diagnosis not present

## 2016-06-28 DIAGNOSIS — N189 Chronic kidney disease, unspecified: Secondary | ICD-10-CM | POA: Diagnosis not present

## 2016-06-28 DIAGNOSIS — I129 Hypertensive chronic kidney disease with stage 1 through stage 4 chronic kidney disease, or unspecified chronic kidney disease: Secondary | ICD-10-CM | POA: Diagnosis not present

## 2016-06-28 DIAGNOSIS — Z471 Aftercare following joint replacement surgery: Secondary | ICD-10-CM | POA: Diagnosis not present

## 2016-07-02 DIAGNOSIS — M5090 Cervical disc disorder, unspecified, unspecified cervical region: Secondary | ICD-10-CM | POA: Diagnosis not present

## 2016-07-02 DIAGNOSIS — M1711 Unilateral primary osteoarthritis, right knee: Secondary | ICD-10-CM | POA: Diagnosis not present

## 2016-07-02 DIAGNOSIS — Z471 Aftercare following joint replacement surgery: Secondary | ICD-10-CM | POA: Diagnosis not present

## 2016-07-02 DIAGNOSIS — I129 Hypertensive chronic kidney disease with stage 1 through stage 4 chronic kidney disease, or unspecified chronic kidney disease: Secondary | ICD-10-CM | POA: Diagnosis not present

## 2016-07-02 DIAGNOSIS — I739 Peripheral vascular disease, unspecified: Secondary | ICD-10-CM | POA: Diagnosis not present

## 2016-07-02 DIAGNOSIS — N189 Chronic kidney disease, unspecified: Secondary | ICD-10-CM | POA: Diagnosis not present

## 2016-07-03 DIAGNOSIS — M1711 Unilateral primary osteoarthritis, right knee: Secondary | ICD-10-CM | POA: Diagnosis not present

## 2016-07-03 DIAGNOSIS — N189 Chronic kidney disease, unspecified: Secondary | ICD-10-CM | POA: Diagnosis not present

## 2016-07-03 DIAGNOSIS — M5090 Cervical disc disorder, unspecified, unspecified cervical region: Secondary | ICD-10-CM | POA: Diagnosis not present

## 2016-07-03 DIAGNOSIS — I739 Peripheral vascular disease, unspecified: Secondary | ICD-10-CM | POA: Diagnosis not present

## 2016-07-03 DIAGNOSIS — Z471 Aftercare following joint replacement surgery: Secondary | ICD-10-CM | POA: Diagnosis not present

## 2016-07-03 DIAGNOSIS — I129 Hypertensive chronic kidney disease with stage 1 through stage 4 chronic kidney disease, or unspecified chronic kidney disease: Secondary | ICD-10-CM | POA: Diagnosis not present

## 2016-07-03 DIAGNOSIS — M25562 Pain in left knee: Secondary | ICD-10-CM | POA: Diagnosis not present

## 2016-07-05 DIAGNOSIS — M5090 Cervical disc disorder, unspecified, unspecified cervical region: Secondary | ICD-10-CM | POA: Diagnosis not present

## 2016-07-05 DIAGNOSIS — M1711 Unilateral primary osteoarthritis, right knee: Secondary | ICD-10-CM | POA: Diagnosis not present

## 2016-07-05 DIAGNOSIS — I739 Peripheral vascular disease, unspecified: Secondary | ICD-10-CM | POA: Diagnosis not present

## 2016-07-05 DIAGNOSIS — Z471 Aftercare following joint replacement surgery: Secondary | ICD-10-CM | POA: Diagnosis not present

## 2016-07-05 DIAGNOSIS — N189 Chronic kidney disease, unspecified: Secondary | ICD-10-CM | POA: Diagnosis not present

## 2016-07-05 DIAGNOSIS — I129 Hypertensive chronic kidney disease with stage 1 through stage 4 chronic kidney disease, or unspecified chronic kidney disease: Secondary | ICD-10-CM | POA: Diagnosis not present

## 2016-07-08 DIAGNOSIS — Z471 Aftercare following joint replacement surgery: Secondary | ICD-10-CM | POA: Diagnosis not present

## 2016-07-08 DIAGNOSIS — N189 Chronic kidney disease, unspecified: Secondary | ICD-10-CM | POA: Diagnosis not present

## 2016-07-08 DIAGNOSIS — I739 Peripheral vascular disease, unspecified: Secondary | ICD-10-CM | POA: Diagnosis not present

## 2016-07-08 DIAGNOSIS — M1711 Unilateral primary osteoarthritis, right knee: Secondary | ICD-10-CM | POA: Diagnosis not present

## 2016-07-08 DIAGNOSIS — M5090 Cervical disc disorder, unspecified, unspecified cervical region: Secondary | ICD-10-CM | POA: Diagnosis not present

## 2016-07-08 DIAGNOSIS — I129 Hypertensive chronic kidney disease with stage 1 through stage 4 chronic kidney disease, or unspecified chronic kidney disease: Secondary | ICD-10-CM | POA: Diagnosis not present

## 2016-07-10 DIAGNOSIS — I129 Hypertensive chronic kidney disease with stage 1 through stage 4 chronic kidney disease, or unspecified chronic kidney disease: Secondary | ICD-10-CM | POA: Diagnosis not present

## 2016-07-10 DIAGNOSIS — Z471 Aftercare following joint replacement surgery: Secondary | ICD-10-CM | POA: Diagnosis not present

## 2016-07-10 DIAGNOSIS — M5090 Cervical disc disorder, unspecified, unspecified cervical region: Secondary | ICD-10-CM | POA: Diagnosis not present

## 2016-07-10 DIAGNOSIS — I739 Peripheral vascular disease, unspecified: Secondary | ICD-10-CM | POA: Diagnosis not present

## 2016-07-10 DIAGNOSIS — N189 Chronic kidney disease, unspecified: Secondary | ICD-10-CM | POA: Diagnosis not present

## 2016-07-10 DIAGNOSIS — M1711 Unilateral primary osteoarthritis, right knee: Secondary | ICD-10-CM | POA: Diagnosis not present

## 2016-07-12 DIAGNOSIS — Z471 Aftercare following joint replacement surgery: Secondary | ICD-10-CM | POA: Diagnosis not present

## 2016-07-12 DIAGNOSIS — M1711 Unilateral primary osteoarthritis, right knee: Secondary | ICD-10-CM | POA: Diagnosis not present

## 2016-07-12 DIAGNOSIS — I129 Hypertensive chronic kidney disease with stage 1 through stage 4 chronic kidney disease, or unspecified chronic kidney disease: Secondary | ICD-10-CM | POA: Diagnosis not present

## 2016-07-12 DIAGNOSIS — I739 Peripheral vascular disease, unspecified: Secondary | ICD-10-CM | POA: Diagnosis not present

## 2016-07-12 DIAGNOSIS — N189 Chronic kidney disease, unspecified: Secondary | ICD-10-CM | POA: Diagnosis not present

## 2016-07-12 DIAGNOSIS — M5090 Cervical disc disorder, unspecified, unspecified cervical region: Secondary | ICD-10-CM | POA: Diagnosis not present

## 2016-07-15 DIAGNOSIS — Z471 Aftercare following joint replacement surgery: Secondary | ICD-10-CM | POA: Diagnosis not present

## 2016-07-15 DIAGNOSIS — I129 Hypertensive chronic kidney disease with stage 1 through stage 4 chronic kidney disease, or unspecified chronic kidney disease: Secondary | ICD-10-CM | POA: Diagnosis not present

## 2016-07-15 DIAGNOSIS — M5090 Cervical disc disorder, unspecified, unspecified cervical region: Secondary | ICD-10-CM | POA: Diagnosis not present

## 2016-07-15 DIAGNOSIS — I739 Peripheral vascular disease, unspecified: Secondary | ICD-10-CM | POA: Diagnosis not present

## 2016-07-15 DIAGNOSIS — M1711 Unilateral primary osteoarthritis, right knee: Secondary | ICD-10-CM | POA: Diagnosis not present

## 2016-07-15 DIAGNOSIS — N189 Chronic kidney disease, unspecified: Secondary | ICD-10-CM | POA: Diagnosis not present

## 2016-07-17 DIAGNOSIS — I129 Hypertensive chronic kidney disease with stage 1 through stage 4 chronic kidney disease, or unspecified chronic kidney disease: Secondary | ICD-10-CM | POA: Diagnosis not present

## 2016-07-17 DIAGNOSIS — I739 Peripheral vascular disease, unspecified: Secondary | ICD-10-CM | POA: Diagnosis not present

## 2016-07-17 DIAGNOSIS — N189 Chronic kidney disease, unspecified: Secondary | ICD-10-CM | POA: Diagnosis not present

## 2016-07-17 DIAGNOSIS — M1711 Unilateral primary osteoarthritis, right knee: Secondary | ICD-10-CM | POA: Diagnosis not present

## 2016-07-17 DIAGNOSIS — Z471 Aftercare following joint replacement surgery: Secondary | ICD-10-CM | POA: Diagnosis not present

## 2016-07-17 DIAGNOSIS — M5090 Cervical disc disorder, unspecified, unspecified cervical region: Secondary | ICD-10-CM | POA: Diagnosis not present

## 2016-07-18 ENCOUNTER — Encounter (INDEPENDENT_AMBULATORY_CARE_PROVIDER_SITE_OTHER): Payer: Self-pay | Admitting: Vascular Surgery

## 2016-07-18 ENCOUNTER — Ambulatory Visit (INDEPENDENT_AMBULATORY_CARE_PROVIDER_SITE_OTHER): Payer: Medicare HMO | Admitting: Vascular Surgery

## 2016-07-18 ENCOUNTER — Encounter (INDEPENDENT_AMBULATORY_CARE_PROVIDER_SITE_OTHER): Payer: Self-pay

## 2016-07-18 VITALS — BP 113/61 | HR 58 | Resp 17 | Wt 283.0 lb

## 2016-07-18 DIAGNOSIS — I2609 Other pulmonary embolism with acute cor pulmonale: Secondary | ICD-10-CM

## 2016-07-18 DIAGNOSIS — I10 Essential (primary) hypertension: Secondary | ICD-10-CM | POA: Diagnosis not present

## 2016-07-18 DIAGNOSIS — I82542 Chronic embolism and thrombosis of left tibial vein: Secondary | ICD-10-CM | POA: Diagnosis not present

## 2016-07-18 DIAGNOSIS — E782 Mixed hyperlipidemia: Secondary | ICD-10-CM

## 2016-07-18 DIAGNOSIS — I2782 Chronic pulmonary embolism: Secondary | ICD-10-CM | POA: Diagnosis not present

## 2016-07-19 DIAGNOSIS — N189 Chronic kidney disease, unspecified: Secondary | ICD-10-CM | POA: Diagnosis not present

## 2016-07-19 DIAGNOSIS — M1711 Unilateral primary osteoarthritis, right knee: Secondary | ICD-10-CM | POA: Diagnosis not present

## 2016-07-19 DIAGNOSIS — M5090 Cervical disc disorder, unspecified, unspecified cervical region: Secondary | ICD-10-CM | POA: Diagnosis not present

## 2016-07-19 DIAGNOSIS — I129 Hypertensive chronic kidney disease with stage 1 through stage 4 chronic kidney disease, or unspecified chronic kidney disease: Secondary | ICD-10-CM | POA: Diagnosis not present

## 2016-07-19 DIAGNOSIS — Z471 Aftercare following joint replacement surgery: Secondary | ICD-10-CM | POA: Diagnosis not present

## 2016-07-19 DIAGNOSIS — I739 Peripheral vascular disease, unspecified: Secondary | ICD-10-CM | POA: Diagnosis not present

## 2016-07-22 DIAGNOSIS — I739 Peripheral vascular disease, unspecified: Secondary | ICD-10-CM | POA: Diagnosis not present

## 2016-07-22 DIAGNOSIS — M5090 Cervical disc disorder, unspecified, unspecified cervical region: Secondary | ICD-10-CM | POA: Diagnosis not present

## 2016-07-22 DIAGNOSIS — Z471 Aftercare following joint replacement surgery: Secondary | ICD-10-CM | POA: Diagnosis not present

## 2016-07-22 DIAGNOSIS — M1711 Unilateral primary osteoarthritis, right knee: Secondary | ICD-10-CM | POA: Diagnosis not present

## 2016-07-22 DIAGNOSIS — N189 Chronic kidney disease, unspecified: Secondary | ICD-10-CM | POA: Diagnosis not present

## 2016-07-22 DIAGNOSIS — I129 Hypertensive chronic kidney disease with stage 1 through stage 4 chronic kidney disease, or unspecified chronic kidney disease: Secondary | ICD-10-CM | POA: Diagnosis not present

## 2016-07-22 NOTE — Progress Notes (Signed)
MRN : [id number]  Jamie Phillips is a 76 y.o. (1940/11/17) female who presents with chief complaint of  Chief Complaint  Patient presents with  . Follow-up    ivc placement  .  History of Present Illness:  The patient presents to the office for evaluation of DVT.  DVT was identified at [facility] by Duplex ultrasound om 06/11/2016 at which time an IVC filter was placed.  The initial symptoms were pain and swelling in the lower extremity.  The patient notes the leg swells just a little.  Symptoms are much better with elevation.  The patient notes minimal edema in the morning which steadily worsens throughout the day.    The patient has been using compression therapy at this point.  No SOB or pleuritic chest pains.  No cough or hemoptysis.  No blood per rectum or blood in any sputum.  No excessive bruising per the patient.       Current Meds  Medication Sig  . acetaminophen (TYLENOL) 650 MG CR tablet Take 650 mg by mouth every 8 (eight) hours as needed for pain.  Marland Kitchen albuterol (PROVENTIL HFA;VENTOLIN HFA) 108 (90 Base) MCG/ACT inhaler Inhale 2 puffs into the lungs every 6 (six) hours as needed for wheezing or shortness of breath.  Marland Kitchen aspirin 81 MG chewable tablet Chew 81 mg by mouth daily.  Marland Kitchen ELIQUIS 5 MG TABS tablet TAKE 1 TABLET TWICE DAILY  . escitalopram (LEXAPRO) 10 MG tablet Take 10 mg by mouth daily.  Marland Kitchen gabapentin (NEURONTIN) 300 MG capsule Take 300 mg by mouth 2 (two) times daily.   Marland Kitchen HYDROcodone-acetaminophen (NORCO/VICODIN) 5-325 MG tablet Take 1 tablet by mouth at bedtime as needed for moderate pain.  Marland Kitchen losartan-hydrochlorothiazide (HYZAAR) 100-12.5 MG tablet Take 1 tablet by mouth daily. Reported on 08/22/2015  . Multiple Vitamins-Minerals (PRESERVISION AREDS 2 PO) Take 1 capsule by mouth 2 (two) times daily.   . pantoprazole (PROTONIX) 40 MG tablet Take 40 mg by mouth daily.  . pravastatin (PRAVACHOL) 40 MG tablet Take 40 mg by mouth daily.   . propranolol (INDERAL) 40 MG  tablet Take 40 mg by mouth 2 (two) times daily. Reported on 08/22/2015   Current Facility-Administered Medications for the 07/18/16 encounter (Office Visit) with Katha Cabal, MD  Medication  . ceFAZolin (ANCEF) IVPB 1 g/50 mL premix    Past Medical History:  Diagnosis Date  . Anxiety   . Arthritis   . Back pain   . Cancer ([place])    cll  . CLL (chronic lymphocytic leukemia) ([place])   . Depression   . GERD (gastroesophageal reflux disease)   . Hemorrhoids   . Hypercholesteremia   . Hyperlipidemia   . Hypertension   . 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 MGUS (monoclonal gammopathy of unknown significance)   . Obesity   . Pulmonary embolism [facility])     Past Surgical History:  Procedure Laterality Date  . BREAST BIOPSY Left 08/19/04   lt bx/clip-neg  . IVC FILTER INSERTION N/A 06/11/2016   Procedure: IVC Filter Insertion;  Surgeon: Katha Cabal, MD;  Location: [place] [facility];  Service: Cardiovascular;  Laterality: N/A;  . neck fusion    . PERIPHERAL VASCULAR CATHETERIZATION N/A 08/15/2015   Procedure: pulmonary angiogram with lysis;  Surgeon: Katha Cabal, MD;  Location: [place] [facility];  Service: Cardiovascular;  Laterality: N/A;  . WRIST SURGERY Left 2000    Social History Social History  Substance  Use Topics  . Smoking status: Never Smoker  . Smokeless tobacco: Never Used  . Alcohol use No    Family History Family History  Problem Relation Age of Onset  . Heart failure Father   . Congestive Heart Failure Father   . Pulmonary embolism Mother   . Breast cancer Neg Hx   No family history of bleeding/clotting disorders, porphyria or autoimmune disease   Allergies  Allergen Reactions  . Oxycodone Other (See Comments)    Reaction:  Hallucinations  . Morphine Other (See Comments)    Reaction:  Hallucinations     REVIEW OF SYSTEMS (Negative unless checked)  Constitutional: [] Weight loss  [] Fever  [] Chills Cardiac: [] Chest  pain   [] Chest pressure   [] Palpitations   [] Shortness of breath when laying flat   [] Shortness of breath with exertion. Vascular:  [] Pain in legs with walking   [x] Pain in legs with standing  [x] History of DVT   [] Phlebitis   [x] Swelling in legs   [] Varicose veins   [] Non-healing ulcers Pulmonary:   [] Uses home oxygen   [] Productive cough   [] Hemoptysis   [] Wheeze  [] COPD   [] Asthma Neurologic:  [] Dizziness   [] Seizures   [] History of stroke   [] History of TIA  [] Aphasia   [] Vissual changes   [] Weakness or numbness in arm   [] Weakness or numbness in leg Musculoskeletal:   [] Joint swelling   [x] Joint pain   [] Low back pain Hematologic:  [] Easy bruising  [] Easy bleeding   [] Hypercoagulable state   [] Anemic Gastrointestinal:  [] Diarrhea   [] Vomiting  [] Gastroesophageal reflux/heartburn   [] Difficulty swallowing. Genitourinary:  [] Chronic kidney disease   [] Difficult urination  [] Frequent urination   [] Blood in urine Skin:  [] Rashes   [] Ulcers  Psychological:  [] History of anxiety   []  History of major depression.  Physical Examination  Vitals:   07/18/16 1011  BP: 113/61  Pulse: (!) 58  Resp: 17  Weight: 283 lb (128.4 kg)   Body mass index is 47.09 kg/m. Gen: WD/WN, NAD Head: [place]/AT, No temporalis wasting.  Ear/Nose/Throat: Hearing grossly intact, nares w/o erythema or drainage, poor dentition Eyes: PER, EOMI, sclera nonicteric.  Neck: Supple, no masses.  No bruit or JVD.  Pulmonary:  Good air movement, clear to auscultation bilaterally, no use of accessory muscles.  Cardiac: RRR, normal S1, S2, no Murmurs. Vascular:  2-3+ edema of the legs Vessel Right Left  Radial Palpable Palpable  Ulnar Palpable Palpable  Brachial Palpable Palpable  Carotid Palpable Palpable  Femoral Palpable Palpable  Popliteal Palpable Palpable  PT Palpable Palpable  DP Palpable Palpable   Gastrointestinal: soft, non-distended. No guarding/no peritoneal signs.  Musculoskeletal: M/S 5/5 throughout.  No  deformity or atrophy.  Neurologic: CN 2-12 intact. Pain and light touch intact in extremities.  Symmetrical.  Speech is fluent. Motor exam as listed above. Psychiatric: Judgment intact, Mood & affect appropriate for pt's clinical situation. Dermatologic: No rashes or ulcers noted.  No changes consistent with cellulitis. Lymph : No Cervical lymphadenopathy, no lichenification or skin changes of chronic lymphedema.  CBC Lab Results  Component Value Date   WBC 21.2 (H) 11/14/2015   HGB 13.4 11/14/2015   HCT 40.7 11/14/2015   MCV 92.9 11/14/2015   PLT 218 11/14/2015    BMET    Component Value Date/Time   NA 140 11/14/2015 0949   NA 141 04/29/2012 1822   K 4.0 11/14/2015 0949   K 3.9 04/29/2012 1822   CL 104 11/14/2015 0949   CL  107 04/29/2012 1822   CO2 30 11/14/2015 0949   CO2 28 04/29/2012 1822   GLUCOSE 117 (H) 11/14/2015 0949   GLUCOSE 129 (H) 04/29/2012 1822   BUN 21 (H) 11/14/2015 0949   BUN 21 (H) 04/29/2012 1822   CREATININE 1.13 (H) 11/14/2015 0949   CREATININE 1.02 (H) 07/28/2014 0950   CALCIUM 9.2 11/14/2015 0949   CALCIUM 8.7 04/29/2012 1822   GFRNONAA 47 (L) 11/14/2015 0949   GFRNONAA 55 (L) 07/28/2014 0950   GFRAA 54 (L) 11/14/2015 0949   GFRAA >60 07/28/2014 0950   CrCl cannot be calculated (Patient's most recent lab result is older than the maximum 21 days allowed.).  COAG Lab Results  Component Value Date   INR 1.14 08/14/2015    Radiology No results found.  Assessment/Plan 1. 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  IVC filter is not yet indicated to be removed.  Patient should have a follow up duplex ultrasound of the venous system in 2 months.  The patient is continuing anticoagulation.   Elevation was stressed, use of a recliner was discussed.  I have had a long discussion with the patient regarding DVT and post phlebitic changes such  as swelling and why it  causes symptoms such as pain.  The patient will wear graduated compression stockings class 1 (20-30 mmHg), beginning after three full days of anticoagulation, on a daily basis a prescription was given. The patient will  beginning wearing the stockings first thing in the morning and removing them in the evening. The patient is instructed specifically not to sleep in the stockings.  In addition, behavioral modification including elevation during the day and avoidance of prolonged dependency will be initiated.    The patient will continue anticoagulation for now as there have not been any problems or complications at this point.    2. 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 Essential hypertension Continue antihypertensive medications as already ordered, these medications have been reviewed and there are no changes at this time.   4. Mixed hyperlipidemia Continue statin as ordered and reviewed, no changes at this time   Hortencia Pilar, MD  07/22/2016 8:19 PM

## 2016-07-24 DIAGNOSIS — I739 Peripheral vascular disease, unspecified: Secondary | ICD-10-CM | POA: Diagnosis not present

## 2016-07-24 DIAGNOSIS — M1711 Unilateral primary osteoarthritis, right knee: Secondary | ICD-10-CM | POA: Diagnosis not present

## 2016-07-24 DIAGNOSIS — Z471 Aftercare following joint replacement surgery: Secondary | ICD-10-CM | POA: Diagnosis not present

## 2016-07-24 DIAGNOSIS — M5090 Cervical disc disorder, unspecified, unspecified cervical region: Secondary | ICD-10-CM | POA: Diagnosis not present

## 2016-07-24 DIAGNOSIS — N189 Chronic kidney disease, unspecified: Secondary | ICD-10-CM | POA: Diagnosis not present

## 2016-07-24 DIAGNOSIS — I129 Hypertensive chronic kidney disease with stage 1 through stage 4 chronic kidney disease, or unspecified chronic kidney disease: Secondary | ICD-10-CM | POA: Diagnosis not present

## 2016-07-26 DIAGNOSIS — M5090 Cervical disc disorder, unspecified, unspecified cervical region: Secondary | ICD-10-CM | POA: Diagnosis not present

## 2016-07-26 DIAGNOSIS — I129 Hypertensive chronic kidney disease with stage 1 through stage 4 chronic kidney disease, or unspecified chronic kidney disease: Secondary | ICD-10-CM | POA: Diagnosis not present

## 2016-07-26 DIAGNOSIS — Z471 Aftercare following joint replacement surgery: Secondary | ICD-10-CM | POA: Diagnosis not present

## 2016-07-26 DIAGNOSIS — I739 Peripheral vascular disease, unspecified: Secondary | ICD-10-CM | POA: Diagnosis not present

## 2016-07-26 DIAGNOSIS — N189 Chronic kidney disease, unspecified: Secondary | ICD-10-CM | POA: Diagnosis not present

## 2016-07-26 DIAGNOSIS — M1711 Unilateral primary osteoarthritis, right knee: Secondary | ICD-10-CM | POA: Diagnosis not present

## 2016-07-30 ENCOUNTER — Inpatient Hospital Stay (HOSPITAL_BASED_OUTPATIENT_CLINIC_OR_DEPARTMENT_OTHER): Payer: Medicare HMO | Admitting: Internal Medicine

## 2016-07-30 ENCOUNTER — Inpatient Hospital Stay: Payer: Medicare HMO | Attending: Internal Medicine

## 2016-07-30 VITALS — BP 118/75 | HR 61 | Temp 96.3°F | Resp 16 | Wt 283.1 lb

## 2016-07-30 DIAGNOSIS — Z7901 Long term (current) use of anticoagulants: Secondary | ICD-10-CM | POA: Diagnosis not present

## 2016-07-30 DIAGNOSIS — E669 Obesity, unspecified: Secondary | ICD-10-CM | POA: Diagnosis not present

## 2016-07-30 DIAGNOSIS — E785 Hyperlipidemia, unspecified: Secondary | ICD-10-CM | POA: Diagnosis not present

## 2016-07-30 DIAGNOSIS — Z7982 Long term (current) use of aspirin: Secondary | ICD-10-CM

## 2016-07-30 DIAGNOSIS — Z86718 Personal history of other venous thrombosis and embolism: Secondary | ICD-10-CM

## 2016-07-30 DIAGNOSIS — F419 Anxiety disorder, unspecified: Secondary | ICD-10-CM | POA: Diagnosis not present

## 2016-07-30 DIAGNOSIS — Z86711 Personal history of pulmonary embolism: Secondary | ICD-10-CM | POA: Insufficient documentation

## 2016-07-30 DIAGNOSIS — C9110 Chronic lymphocytic leukemia of B-cell type not having achieved remission: Secondary | ICD-10-CM

## 2016-07-30 DIAGNOSIS — M129 Arthropathy, unspecified: Secondary | ICD-10-CM

## 2016-07-30 DIAGNOSIS — C9190 Lymphoid leukemia, unspecified not having achieved remission: Secondary | ICD-10-CM | POA: Diagnosis not present

## 2016-07-30 DIAGNOSIS — M549 Dorsalgia, unspecified: Secondary | ICD-10-CM | POA: Insufficient documentation

## 2016-07-30 DIAGNOSIS — F329 Major depressive disorder, single episode, unspecified: Secondary | ICD-10-CM

## 2016-07-30 DIAGNOSIS — Z79899 Other long term (current) drug therapy: Secondary | ICD-10-CM | POA: Insufficient documentation

## 2016-07-30 DIAGNOSIS — M25562 Pain in left knee: Secondary | ICD-10-CM | POA: Diagnosis not present

## 2016-07-30 DIAGNOSIS — I10 Essential (primary) hypertension: Secondary | ICD-10-CM

## 2016-07-30 DIAGNOSIS — E7800 Pure hypercholesterolemia, unspecified: Secondary | ICD-10-CM

## 2016-07-30 DIAGNOSIS — I129 Hypertensive chronic kidney disease with stage 1 through stage 4 chronic kidney disease, or unspecified chronic kidney disease: Secondary | ICD-10-CM | POA: Diagnosis not present

## 2016-07-30 DIAGNOSIS — N183 Chronic kidney disease, stage 3 (moderate): Secondary | ICD-10-CM | POA: Diagnosis not present

## 2016-07-30 DIAGNOSIS — R739 Hyperglycemia, unspecified: Secondary | ICD-10-CM | POA: Diagnosis not present

## 2016-07-30 LAB — COMPREHENSIVE METABOLIC PANEL
ALT: 12 U/L — ABNORMAL LOW (ref 14–54)
ANION GAP: 7 (ref 5–15)
AST: 15 U/L (ref 15–41)
Albumin: 3.5 g/dL (ref 3.5–5.0)
Alkaline Phosphatase: 106 U/L (ref 38–126)
BILIRUBIN TOTAL: 0.6 mg/dL (ref 0.3–1.2)
BUN: 17 mg/dL (ref 6–20)
CHLORIDE: 103 mmol/L (ref 101–111)
CO2: 31 mmol/L (ref 22–32)
Calcium: 9.2 mg/dL (ref 8.9–10.3)
Creatinine, Ser: 1.05 mg/dL — ABNORMAL HIGH (ref 0.44–1.00)
GFR calc Af Amer: 59 mL/min — ABNORMAL LOW (ref >60)
GFR calc non Af Amer: 51 mL/min — ABNORMAL LOW (ref >60)
Glucose, Bld: 120 mg/dL — ABNORMAL HIGH (ref 65–99)
POTASSIUM: 3.7 mmol/L (ref 3.5–5.1)
Sodium: 141 mmol/L (ref 135–145)
TOTAL PROTEIN: 6.4 g/dL — AB (ref 6.5–8.1)

## 2016-07-30 LAB — CBC WITH DIFFERENTIAL/PLATELET
BASOS ABS: 0.1 10*3/uL (ref 0–0.1)
BASOS PCT: 1 %
EOS PCT: 2 %
Eosinophils Absolute: 0.4 10*3/uL (ref 0–0.7)
HEMATOCRIT: 37.7 % (ref 35.0–47.0)
Hemoglobin: 12 g/dL (ref 12.0–16.0)
Lymphocytes Relative: 66 %
Lymphs Abs: 13 10*3/uL — ABNORMAL HIGH (ref 1.0–3.6)
MCH: 29.8 pg (ref 26.0–34.0)
MCHC: 32 g/dL (ref 32.0–36.0)
MCV: 93.3 fL (ref 80.0–100.0)
MONO ABS: 0.9 10*3/uL (ref 0.2–0.9)
MONOS PCT: 5 %
NEUTROS ABS: 5 10*3/uL (ref 1.4–6.5)
Neutrophils Relative %: 26 %
PLATELETS: 285 10*3/uL (ref 150–440)
RBC: 4.04 MIL/uL (ref 3.80–5.20)
RDW: 15.5 % — AB (ref 11.5–14.5)
WBC: 19.4 10*3/uL — ABNORMAL HIGH (ref 3.6–11.0)

## 2016-07-30 NOTE — Assessment & Plan Note (Addendum)
#   Massive bilateral Pulmonary Embolus- s/p Thrombolysis on Eliquis- Unprovoked/ Unclear etiology.  Continued indefinite anti-coagulation. No more new blood clots noted.  # Chronic low-grade B-cell lymphoproliferative disorder/CLL-  Stable ~ 19 k; normal hemoglobin and platelets. Asymptomatic. Monitor for now.  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 Present on Immunofix; Not quantifiable. Monitor for now.   # Bil knee pain; s/p Left knee surgery- improved.  # Patient follow-up with me in approximately 6 months up on labs.

## 2016-07-30 NOTE — Progress Notes (Signed)
[facility] OFFICE PROGRESS NOTE  Patient Care Team: Kirk Ruths, MD as PCP - General (Internal Medicine); Dr.harold Jefm Bryant;    SUMMARY OF ONCOLOGIC HISTORY:  Oncology History   # April 2017- Bil Massive Pulmonary embolus/R LE DVT- s/p Thrombolysis [Dr.Schnier, [facility]]-Eliquis; NEG factor V leide/ Prothrombin gene.   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      CLL (chronic lymphocytic leukemia) ([place])   11/21/2015 Initial Diagnosis    CLL (chronic lymphocytic leukemia) (HCC)        INTERVAL HISTORY:  [name] with above history of History of CLL- on surveillance; and history of bilateral PE- on Eliquis is here for follow-up.  In the interim patient underwent left knee replacement with Dr. Jefm Bryant. Recovery has been uneventful Pain improving.  She continues to be on anticoagulation. No bleeding. No worsening shortness of breath or cough. No hemoptysis. Denies any weight loss or night sweats or fevers. Denies new lumps or bumps.   REVIEW OF SYSTEMS:  A complete 10 point review of system is done which is negative except mentioned above/history of present illness.   PAST MEDICAL HISTORY :  Past Medical History:  Diagnosis Date  . Anxiety   . Arthritis   . Back pain   . Cancer ([place])    cll  . CLL (chronic lymphocytic leukemia) ([place])   . Depression   . GERD (gastroesophageal reflux disease)   . Hemorrhoids   . Hypercholesteremia   . Hyperlipidemia   . Hypertension   . 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 MGUS (monoclonal gammopathy of unknown significance)   . Obesity   . Pulmonary embolism ([place])     PAST SURGICAL HISTORY :   Past Surgical History:  Procedure  Laterality Date  . BREAST BIOPSY Left 08/19/04   lt bx/clip-neg  . IVC FILTER INSERTION N/A 06/11/2016   Procedure: IVC Filter Insertion;  Surgeon: Katha Cabal, MD;  Location: [place] [facility];  Service: Cardiovascular;  Laterality: N/A;  . neck fusion    . PERIPHERAL VASCULAR CATHETERIZATION N/A 08/15/2015   Procedure: pulmonary angiogram with lysis;  Surgeon: Katha Cabal, MD;  Location: [place] [facility];  Service: Cardiovascular;  Laterality: N/A;  . WRIST SURGERY Left 2000    FAMILY HISTORY :   Family History  Problem Relation Age of Onset  . Heart failure Father   . Congestive Heart Failure Father   . Pulmonary embolism Mother   . Breast cancer Neg Hx     SOCIAL HISTORY:   Social History  Substance Use Topics  . Smoking status: Never Smoker  . Smokeless tobacco: Never Used  . Alcohol use No    ALLERGIES:  is allergic to oxycodone and morphine.  MEDICATIONS:  Current Outpatient Prescriptions  Medication Sig Dispense Refill  . acetaminophen (TYLENOL) 650 MG CR tablet Take 650 mg by mouth every 8 (eight) hours as needed for pain.    Marland Kitchen aspirin 81 MG chewable tablet Chew 81 mg by mouth daily.    Marland Kitchen ELIQUIS 5 MG TABS tablet TAKE 1 TABLET TWICE DAILY 180 tablet 6  . escitalopram (LEXAPRO) 10 MG tablet Take 10 mg by mouth daily.    Marland Kitchen gabapentin (NEURONTIN) 300 MG capsule  Take 300 mg by mouth 2 (two) times daily.     Marland Kitchen losartan-hydrochlorothiazide (HYZAAR) 100-12.5 MG tablet Take 1 tablet by mouth daily. Reported on 08/22/2015    . Multiple Vitamins-Minerals (PRESERVISION AREDS 2 PO) Take 1 capsule by mouth 2 (two) times daily.     Marland Kitchen oxyCODONE (OXY IR/ROXICODONE) 5 MG immediate release tablet Take 5 mg by mouth daily as needed.    . pantoprazole (PROTONIX) 40 MG tablet Take 40 mg by mouth daily.    . pravastatin (PRAVACHOL) 40 MG tablet Take 40 mg by mouth daily.     . propranolol (INDERAL) 40 MG tablet Take 40 mg by mouth 2 (two) times daily. Reported on 08/22/2015     . albuterol (PROVENTIL HFA;VENTOLIN HFA) 108 (90 Base) MCG/ACT inhaler Inhale 2 puffs into the lungs every 6 (six) hours as needed for wheezing or shortness of breath.     Current Facility-Administered Medications  Medication Dose Route Frequency Provider Last Rate Last Dose  . ceFAZolin (ANCEF) IVPB 1 g/50 mL premix  1 g Intravenous Once Katha Cabal, MD        PHYSICAL EXAMINATION:   BP 118/75 (BP Location: Left Arm, Patient Position: Sitting)   Pulse 61   Temp (!) 96.3 F (35.7 C) (Tympanic)   Resp 16   Wt 283 lb 1.1 oz (128.4 kg)   BMI 47.11 kg/m   Filed Weights   07/30/16 1013  Weight: 283 lb 1.1 oz (128.4 kg)    GENERAL: Well-nourished well-developed; Alert, no distress and comfortable.   Obese. Walking with a rolling walker. Hyperventilating.  EYES: no pallor or icterus OROPHARYNX: no thrush or ulceration; good dentition  NECK: supple, no masses felt LYMPH:  no palpable lymphadenopathy in the cervical, axillary or inguinal regions LUNGS: clear to auscultation and  No wheeze or crackles HEART/CVS: regular rate & rhythm and no murmurs;  ABDOMEN:abdomen soft, non-tender and normal bowel sounds Musculoskeletal:no cyanosis of digits and no clubbing. Left lower extremity knee swollen. PSYCH: alert & oriented x 3 with fluent speech NEURO: no focal motor/sensory deficits SKIN:  Multiple ecchymosis noted.  LABORATORY DATA:  I have reviewed the data as listed    Component Value Date/Time   NA 141 07/30/2016 0948   NA 141 04/29/2012 1822   K 3.7 07/30/2016 0948   K 3.9 04/29/2012 1822   CL 103 07/30/2016 0948   CL 107 04/29/2012 1822   CO2 31 07/30/2016 0948   CO2 28 04/29/2012 1822   GLUCOSE 120 (H) 07/30/2016 0948   GLUCOSE 129 (H) 04/29/2012 1822   BUN 17 07/30/2016 0948   BUN 21 (H) 04/29/2012 1822   CREATININE 1.05 (H) 07/30/2016 0948   CREATININE 1.02 (H) 07/28/2014 0950   CALCIUM 9.2 07/30/2016 0948   CALCIUM 8.7 04/29/2012 1822   PROT 6.4 (L)  07/30/2016 0948   PROT 7.1 04/29/2012 1822   ALBUMIN 3.5 07/30/2016 0948   ALBUMIN 3.3 (L) 04/29/2012 1822   AST 15 07/30/2016 0948   AST 20 04/29/2012 1822   ALT 12 (L) 07/30/2016 0948   ALT 33 04/29/2012 1822   ALKPHOS 106 07/30/2016 0948   ALKPHOS 169 (H) 04/29/2012 1822   BILITOT 0.6 07/30/2016 0948   BILITOT 0.3 04/29/2012 1822   GFRNONAA 51 (L) 07/30/2016 0948   GFRNONAA 55 (L) 07/28/2014 0950   GFRAA 59 (L) 07/30/2016 0948   GFRAA >60 07/28/2014 0950    No results found for: SPEP, UPEP  Lab Results  Component Value Date  WBC 19.4 (H) 07/30/2016   NEUTROABS 5.0 07/30/2016   HGB 12.0 07/30/2016   HCT 37.7 07/30/2016   MCV 93.3 07/30/2016   PLT 285 07/30/2016      Chemistry      Component Value Date/Time   NA 141 07/30/2016 0948   NA 141 04/29/2012 1822   K 3.7 07/30/2016 0948   K 3.9 04/29/2012 1822   CL 103 07/30/2016 0948   CL 107 04/29/2012 1822   CO2 31 07/30/2016 0948   CO2 28 04/29/2012 1822   BUN 17 07/30/2016 0948   BUN 21 (H) 04/29/2012 1822   CREATININE 1.05 (H) 07/30/2016 0948   CREATININE 1.02 (H) 07/28/2014 0950      Component Value Date/Time   CALCIUM 9.2 07/30/2016 0948   CALCIUM 8.7 04/29/2012 1822   ALKPHOS 106 07/30/2016 0948   ALKPHOS 169 (H) 04/29/2012 1822   AST 15 07/30/2016 0948   AST 20 04/29/2012 1822   ALT 12 (L) 07/30/2016 0948   ALT 33 04/29/2012 1822   BILITOT 0.6 07/30/2016 0948   BILITOT 0.3 04/29/2012 1822        ASSESSMENT & PLAN:   CLL (chronic lymphocytic leukemia) ([place]) # Massive bilateral Pulmonary Embolus- s/p Thrombolysis on Eliquis- Unprovoked/ Unclear etiology.  Continued indefinite anti-coagulation. No more new blood clots noted.  # Chronic low-grade B-cell lymphoproliferative disorder/CLL-  Stable ~ 19 k; normal hemoglobin and platelets. Asymptomatic. Monitor for now.  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 Present on Immunofix; Not quantifiable. Monitor for now.   # Bil knee pain; s/p Left knee  surgery- improved.  # Patient follow-up with me in approximately 6 months up on labs.    Cammie Sickle, MD 07/30/2016 10:53 AM

## 2016-07-30 NOTE — Progress Notes (Signed)
Patient here today for follow up.   

## 2016-07-31 DIAGNOSIS — Z96652 Presence of left artificial knee joint: Secondary | ICD-10-CM | POA: Diagnosis not present

## 2016-07-31 DIAGNOSIS — M6281 Muscle weakness (generalized): Secondary | ICD-10-CM | POA: Diagnosis not present

## 2016-07-31 DIAGNOSIS — M25662 Stiffness of left knee, not elsewhere classified: Secondary | ICD-10-CM | POA: Diagnosis not present

## 2016-07-31 DIAGNOSIS — M25562 Pain in left knee: Secondary | ICD-10-CM | POA: Diagnosis not present

## 2016-07-31 LAB — KAPPA/LAMBDA LIGHT CHAINS
KAPPA FREE LGHT CHN: 26.2 mg/L — AB (ref 3.3–19.4)
KAPPA, LAMDA LIGHT CHAIN RATIO: 4.44 — AB (ref 0.26–1.65)
LAMDA FREE LIGHT CHAINS: 5.9 mg/L (ref 5.7–26.3)

## 2016-08-01 LAB — MULTIPLE MYELOMA PANEL, SERUM
ALBUMIN SERPL ELPH-MCNC: 3.3 g/dL (ref 2.9–4.4)
ALPHA 1: 0.2 g/dL (ref 0.0–0.4)
Albumin/Glob SerPl: 1.3 (ref 0.7–1.7)
Alpha2 Glob SerPl Elph-Mcnc: 0.9 g/dL (ref 0.4–1.0)
B-Globulin SerPl Elph-Mcnc: 1.1 g/dL (ref 0.7–1.3)
GLOBULIN, TOTAL: 2.7 g/dL (ref 2.2–3.9)
Gamma Glob SerPl Elph-Mcnc: 0.4 g/dL (ref 0.4–1.8)
IGA: 196 mg/dL (ref 64–422)
IgG (Immunoglobin G), Serum: 481 mg/dL — ABNORMAL LOW (ref 700–1600)
IgM, Serum: 39 mg/dL (ref 26–217)
M Protein SerPl Elph-Mcnc: 0.3 g/dL — ABNORMAL HIGH
TOTAL PROTEIN ELP: 6 g/dL (ref 6.0–8.5)

## 2016-08-06 DIAGNOSIS — R739 Hyperglycemia, unspecified: Secondary | ICD-10-CM | POA: Diagnosis not present

## 2016-08-06 DIAGNOSIS — Z0000 Encounter for general adult medical examination without abnormal findings: Secondary | ICD-10-CM | POA: Insufficient documentation

## 2016-08-06 DIAGNOSIS — E7800 Pure hypercholesterolemia, unspecified: Secondary | ICD-10-CM | POA: Diagnosis not present

## 2016-08-06 DIAGNOSIS — F325 Major depressive disorder, single episode, in full remission: Secondary | ICD-10-CM | POA: Diagnosis not present

## 2016-08-06 DIAGNOSIS — I2782 Chronic pulmonary embolism: Secondary | ICD-10-CM | POA: Diagnosis not present

## 2016-08-06 DIAGNOSIS — G4733 Obstructive sleep apnea (adult) (pediatric): Secondary | ICD-10-CM | POA: Diagnosis not present

## 2016-08-06 DIAGNOSIS — R1013 Epigastric pain: Secondary | ICD-10-CM | POA: Diagnosis not present

## 2016-08-06 DIAGNOSIS — I129 Hypertensive chronic kidney disease with stage 1 through stage 4 chronic kidney disease, or unspecified chronic kidney disease: Secondary | ICD-10-CM | POA: Diagnosis not present

## 2016-08-07 DIAGNOSIS — Z96652 Presence of left artificial knee joint: Secondary | ICD-10-CM | POA: Diagnosis not present

## 2016-08-07 DIAGNOSIS — M25662 Stiffness of left knee, not elsewhere classified: Secondary | ICD-10-CM | POA: Diagnosis not present

## 2016-08-07 DIAGNOSIS — M6281 Muscle weakness (generalized): Secondary | ICD-10-CM | POA: Diagnosis not present

## 2016-08-07 DIAGNOSIS — M25562 Pain in left knee: Secondary | ICD-10-CM | POA: Diagnosis not present

## 2016-08-08 ENCOUNTER — Other Ambulatory Visit: Payer: Self-pay | Admitting: Internal Medicine

## 2016-08-08 DIAGNOSIS — R1013 Epigastric pain: Secondary | ICD-10-CM

## 2016-08-09 DIAGNOSIS — Z96652 Presence of left artificial knee joint: Secondary | ICD-10-CM | POA: Diagnosis not present

## 2016-08-09 DIAGNOSIS — M25562 Pain in left knee: Secondary | ICD-10-CM | POA: Diagnosis not present

## 2016-08-09 DIAGNOSIS — M6281 Muscle weakness (generalized): Secondary | ICD-10-CM | POA: Diagnosis not present

## 2016-08-09 DIAGNOSIS — M25662 Stiffness of left knee, not elsewhere classified: Secondary | ICD-10-CM | POA: Diagnosis not present

## 2016-08-12 DIAGNOSIS — R1013 Epigastric pain: Secondary | ICD-10-CM | POA: Diagnosis not present

## 2016-08-13 ENCOUNTER — Other Ambulatory Visit: Payer: Self-pay | Admitting: Internal Medicine

## 2016-08-13 DIAGNOSIS — M6281 Muscle weakness (generalized): Secondary | ICD-10-CM | POA: Diagnosis not present

## 2016-08-13 DIAGNOSIS — Z1231 Encounter for screening mammogram for malignant neoplasm of breast: Secondary | ICD-10-CM

## 2016-08-13 DIAGNOSIS — M25662 Stiffness of left knee, not elsewhere classified: Secondary | ICD-10-CM | POA: Diagnosis not present

## 2016-08-13 DIAGNOSIS — Z96652 Presence of left artificial knee joint: Secondary | ICD-10-CM | POA: Diagnosis not present

## 2016-08-13 DIAGNOSIS — M25562 Pain in left knee: Secondary | ICD-10-CM | POA: Diagnosis not present

## 2016-08-15 ENCOUNTER — Ambulatory Visit
Admission: RE | Admit: 2016-08-15 | Discharge: 2016-08-15 | Disposition: A | Discharge status: Home / Self Care | Payer: Medicare HMO | Source: Ambulatory Visit | Attending: Internal Medicine | Admitting: Internal Medicine

## 2016-08-15 DIAGNOSIS — R1013 Epigastric pain: Secondary | ICD-10-CM | POA: Diagnosis not present

## 2016-08-15 DIAGNOSIS — K8020 Calculus of gallbladder without cholecystitis without obstruction: Secondary | ICD-10-CM | POA: Diagnosis not present

## 2016-08-15 DIAGNOSIS — M6281 Muscle weakness (generalized): Secondary | ICD-10-CM | POA: Diagnosis not present

## 2016-08-15 DIAGNOSIS — Z96652 Presence of left artificial knee joint: Secondary | ICD-10-CM | POA: Diagnosis not present

## 2016-08-15 DIAGNOSIS — M25562 Pain in left knee: Secondary | ICD-10-CM | POA: Diagnosis not present

## 2016-08-15 DIAGNOSIS — M25662 Stiffness of left knee, not elsewhere classified: Secondary | ICD-10-CM | POA: Diagnosis not present

## 2016-08-16 ENCOUNTER — Encounter: Payer: Self-pay | Admitting: General Surgery

## 2016-08-20 ENCOUNTER — Encounter: Payer: Self-pay | Admitting: *Deleted

## 2016-08-20 DIAGNOSIS — Z96652 Presence of left artificial knee joint: Secondary | ICD-10-CM | POA: Diagnosis not present

## 2016-08-20 DIAGNOSIS — M25562 Pain in left knee: Secondary | ICD-10-CM | POA: Diagnosis not present

## 2016-08-20 DIAGNOSIS — M25662 Stiffness of left knee, not elsewhere classified: Secondary | ICD-10-CM | POA: Diagnosis not present

## 2016-08-20 DIAGNOSIS — M6281 Muscle weakness (generalized): Secondary | ICD-10-CM | POA: Diagnosis not present

## 2016-08-20 DIAGNOSIS — H2512 Age-related nuclear cataract, left eye: Secondary | ICD-10-CM | POA: Diagnosis not present

## 2016-08-22 DIAGNOSIS — M25662 Stiffness of left knee, not elsewhere classified: Secondary | ICD-10-CM | POA: Diagnosis not present

## 2016-08-22 DIAGNOSIS — Z96652 Presence of left artificial knee joint: Secondary | ICD-10-CM | POA: Diagnosis not present

## 2016-08-22 DIAGNOSIS — M25562 Pain in left knee: Secondary | ICD-10-CM | POA: Diagnosis not present

## 2016-08-22 DIAGNOSIS — M6281 Muscle weakness (generalized): Secondary | ICD-10-CM | POA: Diagnosis not present

## 2016-08-26 ENCOUNTER — Ambulatory Visit (INDEPENDENT_AMBULATORY_CARE_PROVIDER_SITE_OTHER): Payer: Medicare HMO | Admitting: General Surgery

## 2016-08-26 ENCOUNTER — Encounter: Payer: Self-pay | Admitting: General Surgery

## 2016-08-26 VITALS — BP 138/72 | HR 94 | Resp 16 | Ht 63.0 in | Wt 277.0 lb

## 2016-08-26 DIAGNOSIS — Z9989 Dependence on other enabling machines and devices: Secondary | ICD-10-CM | POA: Diagnosis not present

## 2016-08-26 DIAGNOSIS — I2782 Chronic pulmonary embolism: Secondary | ICD-10-CM | POA: Diagnosis not present

## 2016-08-26 DIAGNOSIS — K8020 Calculus of gallbladder without cholecystitis without obstruction: Secondary | ICD-10-CM

## 2016-08-26 DIAGNOSIS — G4733 Obstructive sleep apnea (adult) (pediatric): Secondary | ICD-10-CM | POA: Diagnosis not present

## 2016-08-26 NOTE — Progress Notes (Signed)
Patient ID: Jamie Phillips, female   DOB: May 05, 1940, 76 y.o.   MRN: [id number]  Chief Complaint  Patient presents with  . Abdominal Pain    HPI Jamie Phillips is a 76 y.o. female here today for a evaluation of Abdominal pain. Patient states she has been having pain in her epigastric area for about two months. She states there is a burning feeling up in her epigastric area, but this is different than what she is experienced with reflux in the past.. The burning is constant. In the past, she would occasionally have fatty food intolerance. When initially questioned about exacerbation of symptoms she at first denied any worsening with diet, but later in the interview suggested fatty foods or salads could increase the discomfort. The patient is expressing some radiation of the back bilaterally. No nausea or vomiting. Moves her bowels daily.  Patient has a ultrasound done on 08/15/2016.  HPI  Past Medical History:  Diagnosis Date  . Anxiety   . Arthritis   . Back pain   . Cancer ([place])    cll  . CLL (chronic lymphocytic leukemia) ([place])   . Depression   . GERD (gastroesophageal reflux disease)   . Hemorrhoids   . Hypercholesteremia   . Hyperlipidemia   . Hypertension   . 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 MGUS (monoclonal gammopathy of unknown significance)   . Obesity   . Pulmonary embolism [facility])     Past Surgical History:  Procedure Laterality Date  . BREAST BIOPSY Left 08/19/04   lt bx/clip-neg  . COLONOSCOPY    . IVC FILTER INSERTION N/A 06/11/2016   Procedure: IVC Filter Insertion;  Surgeon: Katha Cabal, MD;  Location: [place] [facility];  Service: Cardiovascular;  Laterality: N/A;  . JOINT REPLACEMENT Left 06/17/2016  . neck fusion    . PERIPHERAL VASCULAR CATHETERIZATION N/A 08/15/2015   Procedure: pulmonary angiogram with lysis;  Surgeon: Katha Cabal, MD;  Location: [place] [facility];  Service: Cardiovascular;  Laterality: N/A;  . WRIST  SURGERY Left 2000    Family History  Problem Relation Age of Onset  . Heart failure Father   . Congestive Heart Failure Father   . Pulmonary embolism Mother   . Breast cancer Neg Hx     Social History Social History  Substance Use Topics  . Smoking status: Never Smoker  . Smokeless tobacco: Never Used  . Alcohol use No    Allergies  Allergen Reactions  . Oxycodone Other (See Comments)    Reaction:  Hallucinations  . Morphine Other (See Comments)    Reaction:  Hallucinations    Current Outpatient Prescriptions  Medication Sig Dispense Refill  . acetaminophen (TYLENOL) 650 MG CR tablet Take 650 mg by mouth every 8 (eight) hours as needed for pain.    Marland Kitchen albuterol (PROVENTIL HFA;VENTOLIN HFA) 108 (90 Base) MCG/ACT inhaler Inhale 2 puffs into the lungs every 6 (six) hours as needed for wheezing or shortness of breath.    Marland Kitchen aspirin 81 MG chewable tablet Chew 81 mg by mouth daily.    Marland Kitchen ELIQUIS 5 MG TABS tablet TAKE 1 TABLET TWICE DAILY 180 tablet 6  . escitalopram (LEXAPRO) 10 MG tablet Take 10 mg by mouth daily.    Marland Kitchen gabapentin (NEURONTIN) 300 MG capsule Take 300 mg by mouth 2 (two) times daily.     Marland Kitchen losartan-hydrochlorothiazide (HYZAAR) 100-12.5 MG tablet Take 1 tablet by mouth daily. Reported on 08/22/2015    .  Multiple Vitamins-Minerals (PRESERVISION AREDS 2 PO) Take 1 capsule by mouth 2 (two) times daily.     . pantoprazole (PROTONIX) 40 MG tablet Take 40 mg by mouth daily.    . pravastatin (PRAVACHOL) 40 MG tablet Take 40 mg by mouth daily.     . propranolol (INDERAL) 40 MG tablet Take 40 mg by mouth 2 (two) times daily. Reported on 08/22/2015     Current Facility-Administered Medications  Medication Dose Route Frequency Provider Last Rate Last Dose  . ceFAZolin (ANCEF) IVPB 1 g/50 mL premix  1 g Intravenous Once Schnier, Dolores Lory, MD        Review of Systems Review of Systems  Constitutional: Negative.   Respiratory: Negative.   Cardiovascular: Negative.    Gastrointestinal: Positive for abdominal pain. Negative for constipation, diarrhea, nausea and vomiting.    Blood pressure 138/72, pulse 94, resp. rate 16, height 5\' 3"  (1.6 m), weight 277 lb (125.6 kg).  Physical Exam Physical Exam  Constitutional: She is oriented to person, place, and time. She appears well-developed and well-nourished.  Eyes: Conjunctivae are normal. No scleral icterus.  Neck: Neck supple.  Cardiovascular: Normal rate, regular rhythm and normal heart sounds.   Pulmonary/Chest: Effort normal and breath sounds normal.  Tender at T 11.  Abdominal: Soft. Bowel sounds are normal.  Lymphadenopathy:    She has no cervical adenopathy.  Neurological: She is alert and oriented to person, place, and time.  Skin: Skin is warm and dry.    Data Reviewed Abdominal ultrasound of 08/15/2016: IMPRESSION: 1. Multiple small gallstones within the gallbladder. No pain over the gallbladder currently. 2. Echogenic inhomogeneous liver parenchyma consistent with fatty infiltration. No focal abnormality. 3. No hydronephrosis. Hematology note of 07/30/2016 reviewed: # Massive bilateral Pulmonary Embolus- s/p Thrombolysis on Eliquis- Unprovoked/ Unclear etiology.  Continued indefinite anti-coagulation. No more new blood clots noted.  # Chronic low-grade B-cell lymphoproliferative disorder/CLL-  Stable ~ 19 k; normal hemoglobin and platelets. Asymptomatic. Monitor for now.  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 Present on Immunofix; Not quantifiable. Monitor for now.   # Bil knee pain; s/p Left knee surgery- improved.  # Patient follow-up with me in approximately 6 months up on labs.  Vascular surgery assessment of 07/18/2016  1. 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  IVC filter is not yet indicated to be removed.  Patient should have a follow up duplex ultrasound of the  venous system in 2 months.  The patient is continuing anticoagulation.   Elevation was stressed, use of a recliner was discussed.  I have had a long discussion with the patient regarding DVT and post phlebitic changes such as swelling and why it  causes symptoms such as pain.  The patient will wear graduated compression stockings class 1 (20-30 mmHg), beginning after three full days of anticoagulation, on a daily basis a prescription was given. The patient will  beginning wearing the stockings first thing in the morning and removing them in the evening. The patient is instructed specifically not to sleep in the stockings.  In addition, behavioral modification including elevation during the day and avoidance of prolonged dependency will be initiated.    The patient will continue anticoagulation for now as there have not been any problems or complications at this point.   08/14/2015 CTA when the patient presented with shortness of breath: IMPRESSION: Positive for acute and  subacute pulmonary emboli with evidence of right heart strain (RV/LV Ratio = 1.2) consistent with at least submassive (intermediate risk) PE Patient subsequently underwent catheter-based thrombolyzes.  Assessment    The patient's symptoms are very atypical for biliary tract disease with the constant pain with minimal dietary influence.  Recent (10 week) DVT while on anticoagulation requiring vena cava filter placement prior to knee replacement.  Anticipated contralateral total knee replacement.    Plan    At this time, there is no evidence that the patient's present symptoms are related to the biliary tract.  I will need to review the case with Dr. Ouida Sills.   Follow up appointment to be announced.   HPI, Physical Exam, Assessment and Plan have been scribed under the direction and in the presence of Hervey Ard, MD.  Gaspar Cola, CMA\  I have completed the exam and reviewed the above documentation  for accuracy and completeness.  I agree with the above.  Haematologist has been used and any errors in dictation or transcription are unintentional.  Hervey Ard, M.D., F.A.C.S.  Jamie Phillips 08/27/2016, 8:39 PM

## 2016-08-26 NOTE — Patient Instructions (Signed)
Follow up appointment to be announced.  

## 2016-08-27 DIAGNOSIS — K8020 Calculus of gallbladder without cholecystitis without obstruction: Secondary | ICD-10-CM | POA: Insufficient documentation

## 2016-09-06 ENCOUNTER — Emergency Department
Admission: EM | Admit: 2016-09-06 | Discharge: 2016-09-06 | Disposition: A | Discharge status: Home / Self Care | Payer: Medicare HMO | Attending: Emergency Medicine | Admitting: Emergency Medicine

## 2016-09-06 ENCOUNTER — Encounter: Payer: Self-pay | Admitting: Emergency Medicine

## 2016-09-06 DIAGNOSIS — Z7982 Long term (current) use of aspirin: Secondary | ICD-10-CM | POA: Diagnosis not present

## 2016-09-06 DIAGNOSIS — R1084 Generalized abdominal pain: Secondary | ICD-10-CM | POA: Diagnosis not present

## 2016-09-06 DIAGNOSIS — I10 Essential (primary) hypertension: Secondary | ICD-10-CM | POA: Diagnosis not present

## 2016-09-06 DIAGNOSIS — Z79899 Other long term (current) drug therapy: Secondary | ICD-10-CM | POA: Insufficient documentation

## 2016-09-06 DIAGNOSIS — R109 Unspecified abdominal pain: Secondary | ICD-10-CM | POA: Diagnosis present

## 2016-09-06 DIAGNOSIS — M549 Dorsalgia, unspecified: Secondary | ICD-10-CM | POA: Diagnosis not present

## 2016-09-06 DIAGNOSIS — Z856 Personal history of leukemia: Secondary | ICD-10-CM | POA: Insufficient documentation

## 2016-09-06 DIAGNOSIS — M545 Low back pain: Secondary | ICD-10-CM | POA: Diagnosis not present

## 2016-09-06 LAB — URINALYSIS, COMPLETE (UACMP) WITH MICROSCOPIC
Bilirubin Urine: NEGATIVE
GLUCOSE, UA: NEGATIVE mg/dL
Hgb urine dipstick: NEGATIVE
KETONES UR: NEGATIVE mg/dL
LEUKOCYTES UA: NEGATIVE
NITRITE: NEGATIVE
PH: 5 (ref 5.0–8.0)
Protein, ur: NEGATIVE mg/dL
SPECIFIC GRAVITY, URINE: 1.018 (ref 1.005–1.030)

## 2016-09-06 LAB — COMPREHENSIVE METABOLIC PANEL
ALBUMIN: 3.6 g/dL (ref 3.5–5.0)
ALT: 11 U/L — ABNORMAL LOW (ref 14–54)
ANION GAP: 7 (ref 5–15)
AST: 19 U/L (ref 15–41)
Alkaline Phosphatase: 95 U/L (ref 38–126)
BILIRUBIN TOTAL: 0.3 mg/dL (ref 0.3–1.2)
BUN: 15 mg/dL (ref 6–20)
CHLORIDE: 107 mmol/L (ref 101–111)
CO2: 30 mmol/L (ref 22–32)
Calcium: 8.9 mg/dL (ref 8.9–10.3)
Creatinine, Ser: 1.06 mg/dL — ABNORMAL HIGH (ref 0.44–1.00)
GFR calc Af Amer: 58 mL/min — ABNORMAL LOW (ref >60)
GFR, EST NON AFRICAN AMERICAN: 50 mL/min — AB (ref >60)
Glucose, Bld: 143 mg/dL — ABNORMAL HIGH (ref 65–99)
POTASSIUM: 3.8 mmol/L (ref 3.5–5.1)
Sodium: 144 mmol/L (ref 135–145)
TOTAL PROTEIN: 6.3 g/dL — AB (ref 6.5–8.1)

## 2016-09-06 LAB — CBC
HEMATOCRIT: 37.7 % (ref 35.0–47.0)
HEMOGLOBIN: 12.4 g/dL (ref 12.0–16.0)
MCH: 30.8 pg (ref 26.0–34.0)
MCHC: 33 g/dL (ref 32.0–36.0)
MCV: 93.2 fL (ref 80.0–100.0)
Platelets: 216 10*3/uL (ref 150–440)
RBC: 4.04 MIL/uL (ref 3.80–5.20)
RDW: 15.4 % — AB (ref 11.5–14.5)
WBC: 15.7 10*3/uL — AB (ref 3.6–11.0)

## 2016-09-06 LAB — LIPASE, BLOOD: LIPASE: 36 U/L (ref 11–51)

## 2016-09-06 MED ORDER — IBUPROFEN 600 MG PO TABS
600.0000 mg | ORAL_TABLET | Freq: Once | ORAL | Status: AC
  Administered 2016-09-06: 600 mg via ORAL
  Filled 2016-09-06: qty 1

## 2016-09-06 MED ORDER — DICYCLOMINE HCL 10 MG PO CAPS
20.0000 mg | ORAL_CAPSULE | Freq: Once | ORAL | Status: AC
  Administered 2016-09-06: 20 mg via ORAL
  Filled 2016-09-06: qty 2

## 2016-09-06 MED ORDER — HYDROCODONE-ACETAMINOPHEN 5-325 MG PO TABS: 1.0000 | ORAL_TABLET | Freq: Four times a day (QID) | ORAL | 0 refills | Status: DC | PRN

## 2016-09-06 MED ORDER — ONDANSETRON 4 MG PO TBDP
8.0000 mg | ORAL_TABLET | Freq: Once | ORAL | Status: AC
  Administered 2016-09-06: 8 mg via ORAL
  Filled 2016-09-06: qty 2

## 2016-09-06 NOTE — ED Triage Notes (Signed)
Pt to ED for abdominal pain and back pain. Pt states that she was diagnosed with Gallstones, was sent to see a surgeon, he referred her to see GI but her appt is not until June 18th. Pt states that she started having pain in her mid to lower back that started hurting ver y bad a few hours ago. Pt does not appear to be in distress at this time.

## 2016-09-06 NOTE — Discharge Instructions (Signed)
Please keep your follow-up with your gastroenterologist and her primary care physician as scheduled. Return to the emergency department for any concerns such as worsening pain, fevers, if he cannot eat or drink, etc.  It was a pleasure to take care of you today, and thank you for coming to our emergency department.  If you have any questions or concerns before leaving please ask the nurse to grab me and I'm more than happy to go through your aftercare instructions again.  If you were prescribed any opioid pain medication today such as Norco, Vicodin, Percocet, morphine, hydrocodone, or oxycodone please make sure you do not drive when you are taking this medication as it can alter your ability to drive safely.  If you have any concerns once you are home that you are not improving or are in fact getting worse before you can make it to your follow-up appointment, please do not hesitate to call 911 and come back for further evaluation.  Darel Hong MD  Results for orders placed or performed during the hospital encounter of 09/06/16  Lipase, blood  Result Value Ref Range   Lipase 36 11 - 51 U/L  Comprehensive metabolic panel  Result Value Ref Range   Sodium 144 135 - 145 mmol/L   Potassium 3.8 3.5 - 5.1 mmol/L   Chloride 107 101 - 111 mmol/L   CO2 30 22 - 32 mmol/L   Glucose, Bld 143 (H) 65 - 99 mg/dL   BUN 15 6 - 20 mg/dL   Creatinine, Ser 1.06 (H) 0.44 - 1.00 mg/dL   Calcium 8.9 8.9 - 10.3 mg/dL   Total Protein 6.3 (L) 6.5 - 8.1 g/dL   Albumin 3.6 3.5 - 5.0 g/dL   AST 19 15 - 41 U/L   ALT 11 (L) 14 - 54 U/L   Alkaline Phosphatase 95 38 - 126 U/L   Total Bilirubin 0.3 0.3 - 1.2 mg/dL   GFR calc non Af Amer 50 (L) >60 mL/min   GFR calc Af Amer 58 (L) >60 mL/min   Anion gap 7 5 - 15  CBC  Result Value Ref Range   WBC 15.7 (H) 3.6 - 11.0 K/uL   RBC 4.04 3.80 - 5.20 MIL/uL   Hemoglobin 12.4 12.0 - 16.0 g/dL   HCT 37.7 35.0 - 47.0 %   MCV 93.2 80.0 - 100.0 fL   MCH 30.8 26.0 - 34.0  pg   MCHC 33.0 32.0 - 36.0 g/dL   RDW 15.4 (H) 11.5 - 14.5 %   Platelets 216 150 - 440 K/uL   US Abdomen Complete  Result Date: 08/15/2016 CLINICAL DATA:  Epigastric pain for 2 months EXAM: ABDOMEN ULTRASOUND COMPLETE COMPARISON:  None. FINDINGS: Gallbladder: The gallbladder is visualized and multiple gallstones are present. The largest gallstone measures 1.3 cm. There is no pain over the gallbladder with compression. Common bile duct: Diameter: The common bile duct is normal measuring 3.0 mm in diameter. Liver: The liver is somewhat echogenic and inhomogeneous consistent with fatty infiltration. No focal hepatic abnormality is noted. IVC: No abnormality visualized. Pancreas: The pancreas appears normal in the head and body with only the distal tail obscured by bowel gas. Spleen: The spleen measures 8.4 cm. Right Kidney: Length: 8.9 cm.  No hydronephrosis is seen. Left Kidney: Length: 10.6 cm.  No hydronephrosis is noted. Abdominal aorta: The abdominal aorta is normal in caliber. Other findings: None. IMPRESSION: 1. Multiple small gallstones within the gallbladder. No pain over the gallbladder currently. 2.  Echogenic inhomogeneous liver parenchyma consistent with fatty infiltration. No focal abnormality. 3. No hydronephrosis. Electronically Signed   By: Ivar Drape M.D.   On: 08/15/2016 10:47

## 2016-09-06 NOTE — ED Provider Notes (Signed)
[facility] Emergency Department Provider Note  ____________________________________________   First MD Initiated Contact with Patient 09/06/16 1648     (approximate)  I have reviewed the triage vital signs and the nursing notes.   HISTORY  Chief Complaint Abdominal Pain    HPI Jamie Phillips is a 76 y.o. female who comes to the emergency department with several weeks of abdominal pain and right back pain. Pain is mild to moderate severity worse with movement. Not postprandial. She initially went to her primary care physician who ordered an ultrasound of her abdomen outpatient and noted several gallstones. She was then referred on to see general surgery who felt that the patient's symptoms were not related to biliary disease and refer her on to gastroenterology. The patient has GI follow-up in the next couple weeks but comes to the emergency department today frustrated because she says that no one has explained to her exactly what gallstones R and she does not know why she has been sent from surgery to GI. She denies fevers or chills. She denies chest pain or shortness of breath. She denies flank pain. She denies dysuria frequency or hesitancy.Her symptoms are intermittent aching cramping. Nothing in particular makes it better or worse.   Past Medical History:  Diagnosis Date  . Anxiety   . Arthritis   . Back pain   . Cancer ([place])    cll  . CLL (chronic lymphocytic leukemia) ([place])   . Depression   . GERD (gastroesophageal reflux disease)   . Hemorrhoids   . Hypercholesteremia   . Hyperlipidemia   . Hypertension   . 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 MGUS (monoclonal gammopathy of unknown significance)   . Obesity   . Pulmonary embolism [facility])     Patient Active Problem List   Diagnosis Date Noted  . Calculus of gallbladder without cholecystitis without obstruction 08/27/2016  . 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  DVT (deep venous thrombosis) ([place]) 03/04/2016  . DJD (degenerative joint disease) 03/04/2016  . Pain in limb 03/04/2016  . Venous insufficiency 03/04/2016  . CLL (chronic lymphocytic leukemia) ([place]) 11/21/2015  . Acute respiratory failure ([place]) 08/14/2015  . Acute bronchitis 05/11/2015  . Essential hypertension 05/04/2015  . Hyperlipidemia 05/04/2015  . Pain in the chest 05/04/2015  . SOB (shortness of breath) 05/04/2015    Past Surgical History:  Procedure Laterality Date  . BREAST BIOPSY Left 08/19/04   lt bx/clip-neg  . COLONOSCOPY    . IVC FILTER INSERTION N/A 06/11/2016   Procedure: IVC Filter Insertion;  Surgeon: Katha Cabal, MD;  Location: [place] [facility];  Service: Cardiovascular;  Laterality: N/A;  . JOINT REPLACEMENT Left 06/17/2016  . neck fusion    . PERIPHERAL VASCULAR CATHETERIZATION N/A 08/15/2015   Procedure: pulmonary angiogram with lysis;  Surgeon: Katha Cabal, MD;  Location: [place] [facility];  Service: Cardiovascular;  Laterality: N/A;  . WRIST SURGERY Left 2000    Prior to Admission medications   Medication Sig Start Date End Date Taking? Authorizing Provider  acetaminophen (TYLENOL) 650 MG CR tablet Take 650 mg by mouth every 8 (eight) hours as needed for pain.    [provider]  albuterol (PROVENTIL HFA;VENTOLIN HFA) 108 (90 Base) MCG/ACT inhaler Inhale 2 puffs into the lungs every 6 (six) hours as needed for wheezing or shortness of breath.    [provider]  aspirin 81 MG chewable tablet Chew 81 mg by  mouth daily.    [provider]  ELIQUIS 5 MG TABS tablet TAKE 1 TABLET TWICE DAILY 01/03/16   Cammie Sickle, MD  escitalopram (LEXAPRO) 10 MG tablet Take 10 mg by mouth daily.    [provider]  gabapentin (NEURONTIN) 300 MG capsule Take 300 mg by mouth 2 (two) times daily.     [provider]  HYDROcodone-acetaminophen (NORCO) 5-325 MG tablet Take 1 tablet by mouth every 6 (six) hours as  needed for severe pain. 09/06/16   Darel Hong, MD  losartan-hydrochlorothiazide (HYZAAR) 100-12.5 MG tablet Take 1 tablet by mouth daily. Reported on 08/22/2015    [provider]  Multiple Vitamins-Minerals (PRESERVISION AREDS 2 PO) Take 1 capsule by mouth 2 (two) times daily.     [provider]  pantoprazole (PROTONIX) 40 MG tablet Take 40 mg by mouth daily.    [provider]  pravastatin (PRAVACHOL) 40 MG tablet Take 40 mg by mouth daily.     [provider]  propranolol (INDERAL) 40 MG tablet Take 40 mg by mouth 2 (two) times daily. Reported on 08/22/2015    [provider]    Allergies Oxycodone and Morphine  Family History  Problem Relation Age of Onset  . Heart failure Father   . Congestive Heart Failure Father   . Pulmonary embolism Mother   . Breast cancer Neg Hx     Social History Social History  Substance Use Topics  . Smoking status: Never Smoker  . Smokeless tobacco: Never Used  . Alcohol use No    Review of Systems Constitutional: No fever/chills Eyes: No visual changes. ENT: No sore throat. Cardiovascular: Denies chest pain. Respiratory: Denies shortness of breath. Gastrointestinal: Positive abdominal pain.  No nausea, no vomiting.  No diarrhea.  No constipation. Genitourinary: Negative for dysuria. Musculoskeletal: Positive for back pain. Skin: Negative for rash. Neurological: Negative for headaches, focal weakness or numbness.   ____________________________________________   PHYSICAL EXAM:  VITAL SIGNS: ED Triage Vitals [09/06/16 1322]  Enc Vitals Group     BP 139/62     Pulse Rate 97     Resp 20     Temp 98.4 F (36.9 C)     Temp Source Oral     SpO2 96 %     Weight 276 lb (125.2 kg)     Height 5\' 5"  (1.651 m)     Head Circumference      Peak Flow      Pain Score 6     Pain Loc      Pain Edu?      Excl. in [place]?     Constitutional: Alert and oriented x 4 well appearing nontoxic no  diaphoresis speaks in full, clear sentences Eyes: PERRL EOMI. Head: Atraumatic. Nose: No congestion/rhinnorhea. Mouth/Throat: No trismus Neck: No stridor.   Cardiovascular: Normal rate, regular rhythm. Grossly normal heart sounds.  Good peripheral circulation. Respiratory: Normal respiratory effort.  No retractions. Lungs CTAB and moving good air Gastrointestinal: Soft and obese nondistended nontender no rebound or guarding no peritonitis no McBurney's tenderness negative Rovsing's no costovertebral tenderness negative Murphy's Musculoskeletal: No lower extremity edema   Neurologic:  Normal speech and language. No gross focal neurologic deficits are appreciated. Skin:  Skin is warm, dry and intact. No rash noted. Psychiatric: Mood and affect are normal. Speech and behavior are normal.    ____________________________________________   LABS (all labs ordered are listed, but only abnormal results are displayed)  Labs Reviewed  COMPREHENSIVE METABOLIC PANEL - Abnormal; Notable for the following:       Result Value   Glucose, Bld 143 (*)    Creatinine, Ser 1.06 (*)    Total Protein 6.3 (*)    ALT 11 (*)    GFR calc non Af Amer 50 (*)    GFR calc Af Amer 58 (*)    All other components within normal limits  CBC - Abnormal; Notable for the following:    WBC 15.7 (*)    RDW 15.4 (*)    All other components within normal limits  URINALYSIS, COMPLETE (UACMP) WITH MICROSCOPIC - Abnormal; Notable for the following:    Color, Urine YELLOW (*)    APPearance HAZY (*)    All other components within normal limits  LIPASE, BLOOD    Labs unremarkable __________________________________________  EKG   ____________________________________________  RADIOLOGY   ____________________________________________   PROCEDURES  Procedure(s) performed: no  Procedures  Critical Care performed: no  Observation: no ____________________________________________   INITIAL IMPRESSION /  ASSESSMENT AND PLAN / ED COURSE  Pertinent labs & imaging results that were available during my care of the patient were reviewed by me and considered in my medical decision making (see chart for details).  I agree with the patient's surgeon that her symptoms are likely not secondary to biliary disease to her gallstones are an incidental finding. She does report lifting something heavy at home recently and she feels like she may have tweaked her back. Her pain is not improved after NSAIDs or Bentyl. Had a lengthy discussion with the patient showing her diagrams explaining the etiology of gallstones and however feel that she is getting appropriate follow-up. At this point she would like to go home. She is eating and drinking and remains afebrile. She is medically stable for outpatient management.      ____________________________________________   FINAL CLINICAL IMPRESSION(S) / ED DIAGNOSES  Final diagnoses:  Generalized abdominal pain  Back pain, unspecified back location, unspecified back pain laterality, unspecified chronicity      NEW MEDICATIONS STARTED DURING THIS VISIT:  Discharge Medication List as of 09/06/2016  6:38 PM    START taking these medications   Details  HYDROcodone-acetaminophen (NORCO) 5-325 MG tablet Take 1 tablet by mouth every 6 (six) hours as needed for severe pain., Starting Fri 09/06/2016, Print         Note:  This document was prepared using Dragon voice recognition software and may include unintentional dictation errors.     Darel Hong, MD 09/06/16 1958

## 2016-09-06 NOTE — ED Notes (Signed)
ED Provider at bedside. 

## 2016-09-23 ENCOUNTER — Ambulatory Visit
Admission: RE | Admit: 2016-09-23 | Discharge: 2016-09-23 | Disposition: A | Discharge status: Home / Self Care | Payer: Medicare HMO | Source: Ambulatory Visit | Attending: Internal Medicine | Admitting: Internal Medicine

## 2016-09-23 DIAGNOSIS — Z1231 Encounter for screening mammogram for malignant neoplasm of breast: Secondary | ICD-10-CM | POA: Diagnosis not present

## 2016-09-26 ENCOUNTER — Ambulatory Visit (INDEPENDENT_AMBULATORY_CARE_PROVIDER_SITE_OTHER): Payer: Medicare HMO

## 2016-09-26 ENCOUNTER — Ambulatory Visit (INDEPENDENT_AMBULATORY_CARE_PROVIDER_SITE_OTHER): Payer: Medicare HMO | Admitting: Vascular Surgery

## 2016-09-26 ENCOUNTER — Other Ambulatory Visit (INDEPENDENT_AMBULATORY_CARE_PROVIDER_SITE_OTHER): Payer: Self-pay | Admitting: Vascular Surgery

## 2016-09-26 ENCOUNTER — Encounter (INDEPENDENT_AMBULATORY_CARE_PROVIDER_SITE_OTHER): Payer: Self-pay | Admitting: Vascular Surgery

## 2016-09-26 VITALS — BP 138/78 | HR 64 | Resp 16 | Ht 65.0 in | Wt 277.0 lb

## 2016-09-26 DIAGNOSIS — Z86718 Personal history of other venous thrombosis and embolism: Secondary | ICD-10-CM | POA: Diagnosis not present

## 2016-09-26 DIAGNOSIS — M79605 Pain in left leg: Secondary | ICD-10-CM

## 2016-09-26 DIAGNOSIS — I824Z2 Acute embolism and thrombosis of unspecified deep veins of left distal lower extremity: Secondary | ICD-10-CM

## 2016-09-26 DIAGNOSIS — I10 Essential (primary) hypertension: Secondary | ICD-10-CM | POA: Diagnosis not present

## 2016-09-26 DIAGNOSIS — I872 Venous insufficiency (chronic) (peripheral): Secondary | ICD-10-CM

## 2016-09-26 DIAGNOSIS — M79604 Pain in right leg: Secondary | ICD-10-CM

## 2016-09-28 NOTE — Progress Notes (Signed)
MRN : [id number]  Jamie Phillips is a 76 y.o. (08-20-1940) female who presents with chief complaint of  Chief Complaint  Patient presents with  . Routine Post Op    2 month venous duplex u/s  .  History of Present Illness: The patient presents to the office for follow up regarding DVT.  DVT was identified at [facility] by Duplex ultrasound om 06/11/2016 at which time an IVC filter was placed.  The initial symptoms were pain and swelling in the lower extremity.  She is now s/p successful left TKR but is planning to do the right knee in the next couple months.  The patient notes the leg continues to be very painful with dependency and swells quite a bite.  Symptoms are much better with elevation.  The patient notes minimal edema in the morning which steadily worsens throughout the day.    The patient has been using compression therapy at this point.  She has done very well in PT and is ambulating well.  No SOB or pleuritic chest pains.  No cough or hemoptysis.  No blood per rectum or blood in any sputum.  No excessive bruising per the patient.     No outpatient prescriptions have been marked as taking for the 09/26/16 encounter (Office Visit) with Delana Meyer, Dolores Lory, MD.   Current Facility-Administered Medications for the 09/26/16 encounter (Office Visit) with Delana Meyer, Dolores Lory, MD  Medication  . ceFAZolin (ANCEF) IVPB 1 g/50 mL premix    Past Medical History:  Diagnosis Date  . Anxiety   . Arthritis   . Back pain   . Cancer ([place])    cll  . CLL (chronic lymphocytic leukemia) ([place])   . Depression   . GERD (gastroesophageal reflux disease)   . Hemorrhoids   . Hypercholesteremia   . Hyperlipidemia   . Hypertension   . 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 MGUS (monoclonal gammopathy of unknown significance)   . Obesity   . Pulmonary embolism [facility])     Past Surgical History:  Procedure Laterality Date  . BREAST BIOPSY Left 08/19/04   lt bx/clip-neg  .  COLONOSCOPY    . IVC FILTER INSERTION N/A 06/11/2016   Procedure: IVC Filter Insertion;  Surgeon: Katha Cabal, MD;  Location: [place] [facility];  Service: Cardiovascular;  Laterality: N/A;  . JOINT REPLACEMENT Left 06/17/2016  . neck fusion    . PERIPHERAL VASCULAR CATHETERIZATION N/A 08/15/2015   Procedure: pulmonary angiogram with lysis;  Surgeon: Katha Cabal, MD;  Location: [place] [facility];  Service: Cardiovascular;  Laterality: N/A;  . WRIST SURGERY Left 2000    Social History Social History  Substance Use Topics  . Smoking status: Never Smoker  . Smokeless tobacco: Never Used  . Alcohol use No    Family History Family History  Problem Relation Age of Onset  . Heart failure Father   . Congestive Heart Failure Father   . Pulmonary embolism Mother   . Breast cancer Neg Hx     Allergies  Allergen Reactions  . Oxycodone Other (See Comments)    Reaction:  Hallucinations  . Morphine Other (See Comments)    Reaction:  Hallucinations     REVIEW OF SYSTEMS (Negative unless checked)  Constitutional: [] Weight loss  [] Fever  [] Chills Cardiac: [] Chest pain   [] Chest pressure   [] Palpitations   [] Shortness of breath when laying flat   [] Shortness of breath with exertion. Vascular:  [] Pain  in legs with walking   [x] Pain in legs with standing  [x] History of DVT   [] Phlebitis   [x] Swelling in legs   [] Varicose veins   [] Non-healing ulcers Pulmonary:   [] Uses home oxygen   [] Productive cough   [] Hemoptysis   [] Wheeze  [] COPD   [] Asthma Neurologic:  [] Dizziness   [] Seizures   [] History of stroke   [] History of TIA  [] Aphasia   [] Vissual changes   [] Weakness or numbness in arm   [] Weakness or numbness in leg Musculoskeletal:   [] Joint swelling   [] Joint pain   [] Low back pain Hematologic:  [] Easy bruising  [] Easy bleeding   [] Hypercoagulable state   [] Anemic Gastrointestinal:  [] Diarrhea   [] Vomiting  [] Gastroesophageal reflux/heartburn   [] Difficulty  swallowing. Genitourinary:  [] Chronic kidney disease   [] Difficult urination  [] Frequent urination   [] Blood in urine Skin:  [] Rashes   [] Ulcers  Psychological:  [] History of anxiety   []  History of major depression.  Physical Examination  Vitals:   09/26/16 1036  BP: 138/78  Pulse: 64  Resp: 16  Weight: 125.6 kg (277 lb)  Height: 5\' 5"  (1.651 m)   Body mass index is 46.1 kg/m. Gen: WD/WN, NAD Head: [place]/AT, No temporalis wasting.  Ear/Nose/Throat: Hearing grossly intact, nares w/o erythema or drainage Eyes: PER, EOMI, sclera nonicteric.  Neck: Supple, no large masses.   Pulmonary:  Good air movement, no audible wheezing bilaterally, no use of accessory muscles.  Cardiac: RRR, no JVD Vascular: 2-3+ swelling soft pitting  Diffuse small varicose veins Vessel Right Left  Radial Palpable Palpable  PT Palpable Palpable  DP Palpable Palpable  Gastrointestinal: Non-distended. No guarding/no peritoneal signs.  Musculoskeletal: M/S 5/5 throughout.  No deformity or atrophy.  Neurologic: CN 2-12 intact. Symmetrical.  Speech is fluent. Motor exam as listed above. Psychiatric: Judgment intact, Mood & affect appropriate for pt's clinical situation. Dermatologic: mild venous changes no ulcers noted.  No changes consistent with cellulitis. Lymph : No lichenification or skin changes of chronic lymphedema.  CBC Lab Results  Component Value Date   WBC 15.7 (H) 09/06/2016   HGB 12.4 09/06/2016   HCT 37.7 09/06/2016   MCV 93.2 09/06/2016   PLT 216 09/06/2016    BMET    Component Value Date/Time   NA 144 09/06/2016 1325   NA 141 04/29/2012 1822   K 3.8 09/06/2016 1325   K 3.9 04/29/2012 1822   CL 107 09/06/2016 1325   CL 107 04/29/2012 1822   CO2 30 09/06/2016 1325   CO2 28 04/29/2012 1822   GLUCOSE 143 (H) 09/06/2016 1325   GLUCOSE 129 (H) 04/29/2012 1822   BUN 15 09/06/2016 1325   BUN 21 (H) 04/29/2012 1822   CREATININE 1.06 (H) 09/06/2016 1325   CREATININE 1.02 (H) 07/28/2014  0950   CALCIUM 8.9 09/06/2016 1325   CALCIUM 8.7 04/29/2012 1822   GFRNONAA 50 (L) 09/06/2016 1325   GFRNONAA 55 (L) 07/28/2014 0950   GFRAA 58 (L) 09/06/2016 1325   GFRAA >60 07/28/2014 0950   CrCl cannot be calculated (Patient's most recent lab result is older than the maximum 21 days allowed.).  COAG Lab Results  Component Value Date   INR 1.14 08/14/2015    Radiology Mm Screening Breast Tomo Bilateral  Result Date: 09/23/2016 CLINICAL DATA:  Screening. EXAM: 2D DIGITAL SCREENING BILATERAL MAMMOGRAM WITH CAD AND ADJUNCT TOMO COMPARISON:  Previous exam(s). ACR Breast Density Category b: There are scattered areas of fibroglandular density. FINDINGS: There are no findings suspicious  for malignancy. Images were processed with CAD. IMPRESSION: No mammographic evidence of malignancy. A result letter of this screening mammogram will be mailed directly to the patient. RECOMMENDATION: Screening mammogram in one year. (Code:SM-B-01Y) BI-RADS CATEGORY  1: Negative. Electronically Signed   By: Lillia Mountain M.D.   On: 09/23/2016 10:06    Assessment/Plan 1. Venous insufficiency No surgery or intervention at this point in time.    I have had a long discussion with the patient regarding venous insufficiency and why it  causes symptoms. I have discussed with the patient the chronic skin changes that accompany venous insufficiency and the long term sequela such as infection and ulceration.  Patient will begin wearing graduated compression stockings class 1 (20-30 mmHg) or compression wraps on a daily basis a prescription was given. The patient will put the stockings on first thing in the morning and removing them in the evening. The patient is instructed specifically not to sleep in the stockings.    In addition, behavioral modification including several periods of elevation of the lower extremities during the day will be continued. I have demonstrated that proper elevation is a position with the ankles  at heart level.  The patient is instructed to begin routine exercise, especially walking on a daily basis  Following the review of the ultrasound the patient will follow up in 2-3 months to reassess the degree of swelling and the control that graduated compression stockings or compression wraps  is offering.   The patient can be assessed for a Lymph Pump at that time  2. History of DVT in adulthood See #1  3. Pain in both lower extremities See #1  4. Essential hypertension Continue antihypertensive medications as already ordered, these medications have been reviewed and there are no changes at this time.    Hortencia Pilar, MD  09/28/2016 4:35 PM

## 2016-10-07 ENCOUNTER — Other Ambulatory Visit: Payer: Self-pay | Admitting: Gastroenterology

## 2016-10-07 DIAGNOSIS — R1084 Generalized abdominal pain: Secondary | ICD-10-CM | POA: Diagnosis not present

## 2016-10-07 DIAGNOSIS — M5489 Other dorsalgia: Secondary | ICD-10-CM | POA: Diagnosis not present

## 2016-10-14 ENCOUNTER — Ambulatory Visit
Admission: RE | Admit: 2016-10-14 | Discharge: 2016-10-14 | Disposition: A | Discharge status: Home / Self Care | Payer: Medicare HMO | Source: Ambulatory Visit | Attending: Gastroenterology | Admitting: Gastroenterology

## 2016-10-14 DIAGNOSIS — R1084 Generalized abdominal pain: Secondary | ICD-10-CM | POA: Insufficient documentation

## 2016-10-14 DIAGNOSIS — D259 Leiomyoma of uterus, unspecified: Secondary | ICD-10-CM | POA: Diagnosis not present

## 2016-10-14 DIAGNOSIS — N83292 Other ovarian cyst, left side: Secondary | ICD-10-CM | POA: Diagnosis not present

## 2016-10-14 DIAGNOSIS — K5790 Diverticulosis of intestine, part unspecified, without perforation or abscess without bleeding: Secondary | ICD-10-CM | POA: Insufficient documentation

## 2016-10-14 DIAGNOSIS — K8020 Calculus of gallbladder without cholecystitis without obstruction: Secondary | ICD-10-CM | POA: Diagnosis not present

## 2016-10-14 DIAGNOSIS — R109 Unspecified abdominal pain: Secondary | ICD-10-CM | POA: Diagnosis not present

## 2016-10-14 MED ORDER — IOPAMIDOL (ISOVUE-300) INJECTION 61%
100.0000 mL | Freq: Once | INTRAVENOUS | Status: AC | PRN
  Administered 2016-10-14: 100 mL via INTRAVENOUS

## 2016-10-29 DIAGNOSIS — M1711 Unilateral primary osteoarthritis, right knee: Secondary | ICD-10-CM | POA: Diagnosis not present

## 2016-10-29 DIAGNOSIS — Z6841 Body Mass Index (BMI) 40.0 and over, adult: Secondary | ICD-10-CM | POA: Diagnosis not present

## 2016-10-29 DIAGNOSIS — Z96652 Presence of left artificial knee joint: Secondary | ICD-10-CM | POA: Diagnosis not present

## 2016-11-03 ENCOUNTER — Emergency Department: Payer: Medicare HMO

## 2016-11-03 ENCOUNTER — Emergency Department
Admission: EM | Admit: 2016-11-03 | Discharge: 2016-11-03 | Disposition: A | Discharge status: Home / Self Care | Payer: Medicare HMO | Attending: Emergency Medicine | Admitting: Emergency Medicine

## 2016-11-03 ENCOUNTER — Encounter: Payer: Self-pay | Admitting: Emergency Medicine

## 2016-11-03 DIAGNOSIS — Z856 Personal history of leukemia: Secondary | ICD-10-CM | POA: Diagnosis not present

## 2016-11-03 DIAGNOSIS — Z7901 Long term (current) use of anticoagulants: Secondary | ICD-10-CM | POA: Diagnosis not present

## 2016-11-03 DIAGNOSIS — I10 Essential (primary) hypertension: Secondary | ICD-10-CM | POA: Diagnosis not present

## 2016-11-03 DIAGNOSIS — Z7982 Long term (current) use of aspirin: Secondary | ICD-10-CM | POA: Insufficient documentation

## 2016-11-03 DIAGNOSIS — Z7902 Long term (current) use of antithrombotics/antiplatelets: Secondary | ICD-10-CM | POA: Insufficient documentation

## 2016-11-03 DIAGNOSIS — R0602 Shortness of breath: Secondary | ICD-10-CM | POA: Diagnosis not present

## 2016-11-03 DIAGNOSIS — Z79899 Other long term (current) drug therapy: Secondary | ICD-10-CM | POA: Insufficient documentation

## 2016-11-03 LAB — TROPONIN I: Troponin I: <0.03 ng/mL (ref <0.03)

## 2016-11-03 LAB — CBC WITH DIFFERENTIAL/PLATELET
Basophils Absolute: 0 10*3/uL (ref 0–0.1)
Basophils Relative: 0 %
EOS PCT: 2 %
Eosinophils Absolute: 0.4 10*3/uL (ref 0–0.7)
HEMATOCRIT: 37 % (ref 35.0–47.0)
Hemoglobin: 12.2 g/dL (ref 12.0–16.0)
LYMPHS ABS: 11.4 10*3/uL — AB (ref 1.0–3.6)
Lymphocytes Relative: 65 %
MCH: 29.9 pg (ref 26.0–34.0)
MCHC: 32.8 g/dL (ref 32.0–36.0)
MCV: 90.9 fL (ref 80.0–100.0)
Monocytes Absolute: 0.9 10*3/uL (ref 0.2–0.9)
Monocytes Relative: 5 %
NEUTROS ABS: 5 10*3/uL (ref 1.4–6.5)
Neutrophils Relative %: 28 %
PLATELETS: 205 10*3/uL (ref 150–440)
RBC: 4.07 MIL/uL (ref 3.80–5.20)
RDW: 15.7 % — AB (ref 11.5–14.5)
WBC: 17.7 10*3/uL — ABNORMAL HIGH (ref 3.6–11.0)

## 2016-11-03 LAB — BASIC METABOLIC PANEL
Anion gap: 8 (ref 5–15)
BUN: 24 mg/dL — AB (ref 6–20)
CO2: 29 mmol/L (ref 22–32)
Calcium: 9.3 mg/dL (ref 8.9–10.3)
Chloride: 108 mmol/L (ref 101–111)
Creatinine, Ser: 0.96 mg/dL (ref 0.44–1.00)
GFR calc Af Amer: >60 mL/min (ref >60)
GFR, EST NON AFRICAN AMERICAN: 57 mL/min — AB (ref >60)
GLUCOSE: 119 mg/dL — AB (ref 65–99)
POTASSIUM: 3.8 mmol/L (ref 3.5–5.1)
Sodium: 145 mmol/L (ref 135–145)

## 2016-11-03 MED ORDER — IPRATROPIUM-ALBUTEROL 0.5-2.5 (3) MG/3ML IN SOLN
3.0000 mL | Freq: Once | RESPIRATORY_TRACT | Status: AC
  Administered 2016-11-03: 3 mL via RESPIRATORY_TRACT

## 2016-11-03 MED ORDER — IOPAMIDOL (ISOVUE-370) INJECTION 76%
75.0000 mL | Freq: Once | INTRAVENOUS | Status: AC | PRN
  Administered 2016-11-03: 75 mL via INTRAVENOUS

## 2016-11-03 MED ORDER — IPRATROPIUM-ALBUTEROL 0.5-2.5 (3) MG/3ML IN SOLN
RESPIRATORY_TRACT | Status: AC
  Administered 2016-11-03: 3 mL via RESPIRATORY_TRACT
  Filled 2016-11-03: qty 3

## 2016-11-03 MED ORDER — ALBUTEROL SULFATE HFA 108 (90 BASE) MCG/ACT IN AERS: 2.0000 | INHALATION_SPRAY | Freq: Four times a day (QID) | RESPIRATORY_TRACT | 0 refills | Status: DC | PRN

## 2016-11-03 NOTE — ED Notes (Signed)
FIRST NURSE NOTE-Pt helped from car. Labored breathing. C/o SHOB. Hx PE with filter and blood thinners

## 2016-11-03 NOTE — ED Triage Notes (Addendum)
Pt reports shortness of breath and feeling bad since last night. Pt short of breath even when speaking in full sentences. Pt c/o back pain also.  Pt has hx of PE and has an IVC filter. Pt taking Eliquis took AM dose.  Pt had knee replacement surgery in Feb 2018. No recent long travel.

## 2016-11-03 NOTE — ED Notes (Signed)
Patient transported to X-ray 

## 2016-11-03 NOTE — Discharge Instructions (Signed)
Please seek medical attention for any high fevers, chest pain, shortness of breath, change in behavior, persistent vomiting, bloody stool or any other new or concerning symptoms.  

## 2016-11-03 NOTE — ED Provider Notes (Signed)
[facility] Emergency Department Provider Note   ____________________________________________   I have reviewed the triage vital signs and the nursing notes.   HISTORY  Chief Complaint Shortness of Breath   History limited by: Not Limited   HPI Jamie Phillips is a 76 y.o. female who presents to the emergency department today because of concern for shortness of breath. It started last night. It has been constant since then. She tried using her CPAP machine which did not give any relief. She denies any associated chest pain or cough. No recent fevers. She does have a history of PE. Has an IVC filter. Is on eliquis.    Past Medical History:  Diagnosis Date  . Anxiety   . Arthritis   . Back pain   . Cancer ([place])    cll  . CLL (chronic lymphocytic leukemia) ([place])   . Depression   . GERD (gastroesophageal reflux disease)   . Hemorrhoids   . Hypercholesteremia   . Hyperlipidemia   . Hypertension   . 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 MGUS (monoclonal gammopathy of unknown significance)   . Obesity   . Pulmonary embolism [facility])     Patient Active Problem List   Diagnosis Date Noted  . Calculus of gallbladder without cholecystitis without obstruction 08/27/2016  . 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 History of DVT in adulthood 03/04/2016  . DJD (degenerative joint disease) 03/04/2016  . Pain in limb 03/04/2016  . Venous insufficiency 03/04/2016  . CLL (chronic lymphocytic leukemia) ([place]) 11/21/2015  . Acute respiratory failure ([place]) 08/14/2015  . Acute bronchitis 05/11/2015  . Essential hypertension 05/04/2015  . Hyperlipidemia 05/04/2015  . Pain in the chest 05/04/2015  . SOB (shortness of breath) 05/04/2015    Past Surgical History:  Procedure Laterality Date  . BREAST BIOPSY Left 08/19/04   lt bx/clip-neg  . COLONOSCOPY    . IVC FILTER INSERTION N/A 06/11/2016   Procedure: IVC Filter Insertion;  Surgeon:  Katha Cabal, MD;  Location: [place] [facility];  Service: Cardiovascular;  Laterality: N/A;  . JOINT REPLACEMENT Left 06/17/2016  . neck fusion    . PERIPHERAL VASCULAR CATHETERIZATION N/A 08/15/2015   Procedure: pulmonary angiogram with lysis;  Surgeon: Katha Cabal, MD;  Location: [place] [facility];  Service: Cardiovascular;  Laterality: N/A;  . WRIST SURGERY Left 2000    Prior to Admission medications   Medication Sig Start Date End Date Taking? Authorizing Provider  acetaminophen (TYLENOL) 650 MG CR tablet Take 650 mg by mouth every 8 (eight) hours as needed for pain.    [provider]  albuterol (PROVENTIL HFA;VENTOLIN HFA) 108 (90 Base) MCG/ACT inhaler Inhale 2 puffs into the lungs every 6 (six) hours as needed for wheezing or shortness of breath.    [provider]  aspirin 81 MG chewable tablet Chew 81 mg by mouth daily.    [provider]  ELIQUIS 5 MG TABS tablet TAKE 1 TABLET TWICE DAILY 01/03/16   Cammie Sickle, MD  escitalopram (LEXAPRO) 10 MG tablet Take 10 mg by mouth daily.    [provider]  gabapentin (NEURONTIN) 300 MG capsule Take 300 mg by mouth 2 (two) times daily.     [provider]  HYDROcodone-acetaminophen (NORCO) 5-325 MG tablet Take 1 tablet by mouth every 6 (six) hours as needed for severe pain. 09/06/16   Darel Hong, MD  losartan-hydrochlorothiazide (HYZAAR) 100-12.5 MG tablet Take 1 tablet  by mouth daily. Reported on 08/22/2015    [provider]  Multiple Vitamins-Minerals (PRESERVISION AREDS 2 PO) Take 1 capsule by mouth 2 (two) times daily.     [provider]  pantoprazole (PROTONIX) 40 MG tablet Take 40 mg by mouth daily.    [provider]  pravastatin (PRAVACHOL) 40 MG tablet Take 40 mg by mouth daily.     [provider]  propranolol (INDERAL) 40 MG tablet Take 40 mg by mouth 2 (two) times daily. Reported on 08/22/2015    [provider]     Allergies Oxycodone and Morphine  Family History  Problem Relation Age of Onset  . Heart failure Father   . Congestive Heart Failure Father   . Pulmonary embolism Mother   . Breast cancer Neg Hx     Social History Social History  Substance Use Topics  . Smoking status: Never Smoker  . Smokeless tobacco: Never Used  . Alcohol use No    Review of Systems Constitutional: No fever/chills Eyes: No visual changes. ENT: No sore throat. Cardiovascular: Denies chest pain. Respiratory: Positive for shortness of breath.  Gastrointestinal: No abdominal pain.  No nausea, no vomiting.  No diarrhea.   Genitourinary: Negative for dysuria. Musculoskeletal: Negative for back pain. Skin: Negative for rash. Neurological: Negative for headaches, focal weakness or numbness.  ____________________________________________   PHYSICAL EXAM:  VITAL SIGNS: ED Triage Vitals  Enc Vitals Group     BP 11/03/16 1539 126/76     Pulse Rate 11/03/16 1539 (!) 59     Resp 11/03/16 1539 (!) 28     Temp 11/03/16 1541 97.7 F (36.5 C)     Temp Source 11/03/16 1539 Oral     SpO2 11/03/16 1539 97 %     Weight 11/03/16 1539 276 lb (125.2 kg)     Height 11/03/16 1539 5\' 5"  (1.651 m)     Head Circumference --      Peak Flow --      Pain Score 11/03/16 1539 4   Constitutional: Alert and oriented. Well appearing and in no distress. Eyes: Conjunctivae are normal.  ENT   Head: Normocephalic and atraumatic.   Nose: No congestion/rhinnorhea.   Mouth/Throat: Mucous membranes are moist.   Neck: No stridor. Hematological/Lymphatic/Immunilogical: No cervical lymphadenopathy. Cardiovascular: Normal rate, regular rhythm.  No murmurs, rubs, or gallops. Respiratory: Normal respiratory effort without tachypnea nor retractions. Breath sounds are clear and equal bilaterally. No wheezes/rales/rhonchi. Gastrointestinal: Soft and non tender. No rebound. No guarding.  Genitourinary:  Deferred Musculoskeletal: Normal range of motion in all extremities. No lower extremity edema. Neurologic:  Normal speech and language. No gross focal neurologic deficits are appreciated.  Skin:  Skin is warm, dry and intact. No rash noted. Psychiatric: Mood and affect are normal. Speech and behavior are normal. Patient exhibits appropriate insight and judgment.  ____________________________________________    LABS (pertinent positives/negatives)  Labs Reviewed  CBC WITH DIFFERENTIAL/PLATELET - Abnormal; Notable for the following:       Result Value   WBC 17.7 (*)    RDW 15.7 (*)    Lymphs Abs 11.4 (*)    All other components within normal limits  BASIC METABOLIC PANEL - Abnormal; Notable for the following:    Glucose, Bld 119 (*)    BUN 24 (*)    GFR calc non Af Amer 57 (*)    All other components within normal limits  TROPONIN I    ____________________________________________   EKG  I, Carter Kitten  Archie Balboa, attending physician, personally viewed and interpreted this EKG  EKG Time: 1547 Rate: 57 Rhythm: sinus bradycardia Axis: left axis deviation Intervals: qtc 485 QRS: narrow ST changes: no st elevation Impression: normal ekg   ____________________________________________    RADIOLOGY  CXR   IMPRESSION:  No acute abnormality seen.      ____________________________________________   PROCEDURES  Procedures  ____________________________________________   INITIAL IMPRESSION / ASSESSMENT AND PLAN / ED COURSE  Pertinent labs & imaging results that were available during my care of the patient were reviewed by me and considered in my medical decision making (see chart for details).  Patient presented to the emergency department today because of concerns for shortness of breath. Patient's chest x-ray and CT scan without concerning findings per patient did have a leukocytosis however this does appear to be patient's baseline. Patient did feel better after  DuoNeb treatment. Will plan on discharging home.  ____________________________________________   FINAL CLINICAL IMPRESSION(S) / ED DIAGNOSES  Final diagnoses:  SOB (shortness of breath)     Note: This dictation was prepared with Dragon dictation. Any transcriptional errors that result from this process are unintentional     Nance Pear, MD 11/03/16 [phone]

## 2016-12-09 DIAGNOSIS — Z6841 Body Mass Index (BMI) 40.0 and over, adult: Secondary | ICD-10-CM | POA: Diagnosis not present

## 2016-12-09 DIAGNOSIS — F325 Major depressive disorder, single episode, in full remission: Secondary | ICD-10-CM | POA: Diagnosis not present

## 2016-12-09 DIAGNOSIS — I2782 Chronic pulmonary embolism: Secondary | ICD-10-CM | POA: Diagnosis not present

## 2016-12-09 DIAGNOSIS — G4733 Obstructive sleep apnea (adult) (pediatric): Secondary | ICD-10-CM | POA: Diagnosis not present

## 2016-12-09 DIAGNOSIS — C9190 Lymphoid leukemia, unspecified not having achieved remission: Secondary | ICD-10-CM | POA: Diagnosis not present

## 2016-12-09 DIAGNOSIS — E7800 Pure hypercholesterolemia, unspecified: Secondary | ICD-10-CM | POA: Diagnosis not present

## 2016-12-09 DIAGNOSIS — I129 Hypertensive chronic kidney disease with stage 1 through stage 4 chronic kidney disease, or unspecified chronic kidney disease: Secondary | ICD-10-CM | POA: Diagnosis not present

## 2016-12-09 DIAGNOSIS — R739 Hyperglycemia, unspecified: Secondary | ICD-10-CM | POA: Diagnosis not present

## 2016-12-17 DIAGNOSIS — G4733 Obstructive sleep apnea (adult) (pediatric): Secondary | ICD-10-CM | POA: Diagnosis not present

## 2017-01-06 DIAGNOSIS — Z01818 Encounter for other preprocedural examination: Secondary | ICD-10-CM | POA: Diagnosis not present

## 2017-01-06 DIAGNOSIS — Z01812 Encounter for preprocedural laboratory examination: Secondary | ICD-10-CM | POA: Diagnosis not present

## 2017-01-06 DIAGNOSIS — M1711 Unilateral primary osteoarthritis, right knee: Secondary | ICD-10-CM | POA: Diagnosis not present

## 2017-01-13 DIAGNOSIS — F3289 Other specified depressive episodes: Secondary | ICD-10-CM | POA: Diagnosis not present

## 2017-01-13 DIAGNOSIS — D72829 Elevated white blood cell count, unspecified: Secondary | ICD-10-CM | POA: Diagnosis not present

## 2017-01-13 DIAGNOSIS — J449 Chronic obstructive pulmonary disease, unspecified: Secondary | ICD-10-CM | POA: Diagnosis not present

## 2017-01-13 DIAGNOSIS — F419 Anxiety disorder, unspecified: Secondary | ICD-10-CM | POA: Diagnosis not present

## 2017-01-13 DIAGNOSIS — F329 Major depressive disorder, single episode, unspecified: Secondary | ICD-10-CM | POA: Diagnosis not present

## 2017-01-13 DIAGNOSIS — R52 Pain, unspecified: Secondary | ICD-10-CM | POA: Diagnosis not present

## 2017-01-13 DIAGNOSIS — G8918 Other acute postprocedural pain: Secondary | ICD-10-CM | POA: Diagnosis not present

## 2017-01-13 DIAGNOSIS — Z6841 Body Mass Index (BMI) 40.0 and over, adult: Secondary | ICD-10-CM | POA: Diagnosis not present

## 2017-01-13 DIAGNOSIS — M1711 Unilateral primary osteoarthritis, right knee: Secondary | ICD-10-CM | POA: Diagnosis not present

## 2017-01-13 DIAGNOSIS — G4733 Obstructive sleep apnea (adult) (pediatric): Secondary | ICD-10-CM | POA: Diagnosis not present

## 2017-01-13 DIAGNOSIS — I129 Hypertensive chronic kidney disease with stage 1 through stage 4 chronic kidney disease, or unspecified chronic kidney disease: Secondary | ICD-10-CM | POA: Diagnosis not present

## 2017-01-13 DIAGNOSIS — I10 Essential (primary) hypertension: Secondary | ICD-10-CM | POA: Diagnosis not present

## 2017-01-13 DIAGNOSIS — M25561 Pain in right knee: Secondary | ICD-10-CM | POA: Diagnosis not present

## 2017-01-13 DIAGNOSIS — M25562 Pain in left knee: Secondary | ICD-10-CM | POA: Diagnosis not present

## 2017-01-13 DIAGNOSIS — Z86711 Personal history of pulmonary embolism: Secondary | ICD-10-CM | POA: Diagnosis not present

## 2017-01-13 DIAGNOSIS — N189 Chronic kidney disease, unspecified: Secondary | ICD-10-CM | POA: Diagnosis not present

## 2017-01-16 ENCOUNTER — Other Ambulatory Visit: Payer: Self-pay

## 2017-01-16 NOTE — Patient Outreach (Signed)
[facility]) Care Management  01/16/2017  JEDA PARDUE 1940/11/18 [id number]     Transition of Care Referral  Referral Date: 01/16/17 Referral Source: [facility] Discharge Report Date of Admission: unknown Diagnosis: unknown Date of Discharge: 01/15/17 Facility: [place]: [facility]    Outreach attempt # 1 to patient. Spoke with patient. She sounded irritated and voiced that she has been having a lot of pain since hospitalization. She states she is taking Oxycodone q6hrs prn as ordered. Patient not interested in completing call further. States she has no THN needs or concerns at this time.    Plan: RN CM will notify [facility] administrative assistant of case status.   Enzo Montgomery, RN,BSN,CCM [facility] Telephonic Care Management Coordinator Direct Phone: [phone] Toll Free: [phone] Fax: [phone]

## 2017-01-17 DIAGNOSIS — I129 Hypertensive chronic kidney disease with stage 1 through stage 4 chronic kidney disease, or unspecified chronic kidney disease: Secondary | ICD-10-CM | POA: Diagnosis not present

## 2017-01-17 DIAGNOSIS — G8929 Other chronic pain: Secondary | ICD-10-CM | POA: Diagnosis not present

## 2017-01-17 DIAGNOSIS — I739 Peripheral vascular disease, unspecified: Secondary | ICD-10-CM | POA: Diagnosis not present

## 2017-01-17 DIAGNOSIS — M5090 Cervical disc disorder, unspecified, unspecified cervical region: Secondary | ICD-10-CM | POA: Diagnosis not present

## 2017-01-17 DIAGNOSIS — N189 Chronic kidney disease, unspecified: Secondary | ICD-10-CM | POA: Diagnosis not present

## 2017-01-17 DIAGNOSIS — Z471 Aftercare following joint replacement surgery: Secondary | ICD-10-CM | POA: Diagnosis not present

## 2017-01-17 DIAGNOSIS — M25561 Pain in right knee: Secondary | ICD-10-CM | POA: Diagnosis not present

## 2017-01-20 DIAGNOSIS — N189 Chronic kidney disease, unspecified: Secondary | ICD-10-CM | POA: Diagnosis not present

## 2017-01-20 DIAGNOSIS — I129 Hypertensive chronic kidney disease with stage 1 through stage 4 chronic kidney disease, or unspecified chronic kidney disease: Secondary | ICD-10-CM | POA: Diagnosis not present

## 2017-01-20 DIAGNOSIS — I739 Peripheral vascular disease, unspecified: Secondary | ICD-10-CM | POA: Diagnosis not present

## 2017-01-20 DIAGNOSIS — M5090 Cervical disc disorder, unspecified, unspecified cervical region: Secondary | ICD-10-CM | POA: Diagnosis not present

## 2017-01-20 DIAGNOSIS — Z471 Aftercare following joint replacement surgery: Secondary | ICD-10-CM | POA: Diagnosis not present

## 2017-01-20 DIAGNOSIS — G8929 Other chronic pain: Secondary | ICD-10-CM | POA: Diagnosis not present

## 2017-01-22 DIAGNOSIS — G8929 Other chronic pain: Secondary | ICD-10-CM | POA: Diagnosis not present

## 2017-01-22 DIAGNOSIS — I129 Hypertensive chronic kidney disease with stage 1 through stage 4 chronic kidney disease, or unspecified chronic kidney disease: Secondary | ICD-10-CM | POA: Diagnosis not present

## 2017-01-22 DIAGNOSIS — N189 Chronic kidney disease, unspecified: Secondary | ICD-10-CM | POA: Diagnosis not present

## 2017-01-22 DIAGNOSIS — Z471 Aftercare following joint replacement surgery: Secondary | ICD-10-CM | POA: Diagnosis not present

## 2017-01-22 DIAGNOSIS — I739 Peripheral vascular disease, unspecified: Secondary | ICD-10-CM | POA: Diagnosis not present

## 2017-01-22 DIAGNOSIS — M5090 Cervical disc disorder, unspecified, unspecified cervical region: Secondary | ICD-10-CM | POA: Diagnosis not present

## 2017-01-24 DIAGNOSIS — Z471 Aftercare following joint replacement surgery: Secondary | ICD-10-CM | POA: Diagnosis not present

## 2017-01-24 DIAGNOSIS — I129 Hypertensive chronic kidney disease with stage 1 through stage 4 chronic kidney disease, or unspecified chronic kidney disease: Secondary | ICD-10-CM | POA: Diagnosis not present

## 2017-01-24 DIAGNOSIS — M5090 Cervical disc disorder, unspecified, unspecified cervical region: Secondary | ICD-10-CM | POA: Diagnosis not present

## 2017-01-24 DIAGNOSIS — N189 Chronic kidney disease, unspecified: Secondary | ICD-10-CM | POA: Diagnosis not present

## 2017-01-24 DIAGNOSIS — G8929 Other chronic pain: Secondary | ICD-10-CM | POA: Diagnosis not present

## 2017-01-24 DIAGNOSIS — I739 Peripheral vascular disease, unspecified: Secondary | ICD-10-CM | POA: Diagnosis not present

## 2017-01-27 DIAGNOSIS — G8929 Other chronic pain: Secondary | ICD-10-CM | POA: Diagnosis not present

## 2017-01-27 DIAGNOSIS — I129 Hypertensive chronic kidney disease with stage 1 through stage 4 chronic kidney disease, or unspecified chronic kidney disease: Secondary | ICD-10-CM | POA: Diagnosis not present

## 2017-01-27 DIAGNOSIS — I739 Peripheral vascular disease, unspecified: Secondary | ICD-10-CM | POA: Diagnosis not present

## 2017-01-27 DIAGNOSIS — N189 Chronic kidney disease, unspecified: Secondary | ICD-10-CM | POA: Diagnosis not present

## 2017-01-27 DIAGNOSIS — M5090 Cervical disc disorder, unspecified, unspecified cervical region: Secondary | ICD-10-CM | POA: Diagnosis not present

## 2017-01-27 DIAGNOSIS — Z471 Aftercare following joint replacement surgery: Secondary | ICD-10-CM | POA: Diagnosis not present

## 2017-01-28 ENCOUNTER — Inpatient Hospital Stay: Payer: Medicare HMO | Attending: Internal Medicine | Admitting: Internal Medicine

## 2017-01-28 ENCOUNTER — Inpatient Hospital Stay: Payer: Medicare HMO

## 2017-01-28 ENCOUNTER — Other Ambulatory Visit: Payer: Self-pay

## 2017-01-28 VITALS — BP 120/55 | HR 56 | Temp 98.0°F | Resp 22 | Ht 65.0 in | Wt 275.6 lb

## 2017-01-28 DIAGNOSIS — I10 Essential (primary) hypertension: Secondary | ICD-10-CM | POA: Insufficient documentation

## 2017-01-28 DIAGNOSIS — F419 Anxiety disorder, unspecified: Secondary | ICD-10-CM | POA: Diagnosis not present

## 2017-01-28 DIAGNOSIS — E7800 Pure hypercholesterolemia, unspecified: Secondary | ICD-10-CM | POA: Diagnosis not present

## 2017-01-28 DIAGNOSIS — C9110 Chronic lymphocytic leukemia of B-cell type not having achieved remission: Secondary | ICD-10-CM

## 2017-01-28 DIAGNOSIS — Z86718 Personal history of other venous thrombosis and embolism: Secondary | ICD-10-CM

## 2017-01-28 DIAGNOSIS — E785 Hyperlipidemia, unspecified: Secondary | ICD-10-CM | POA: Insufficient documentation

## 2017-01-28 DIAGNOSIS — Z86711 Personal history of pulmonary embolism: Secondary | ICD-10-CM | POA: Diagnosis not present

## 2017-01-28 DIAGNOSIS — M129 Arthropathy, unspecified: Secondary | ICD-10-CM | POA: Insufficient documentation

## 2017-01-28 DIAGNOSIS — F329 Major depressive disorder, single episode, unspecified: Secondary | ICD-10-CM | POA: Diagnosis not present

## 2017-01-28 DIAGNOSIS — Z79899 Other long term (current) drug therapy: Secondary | ICD-10-CM | POA: Diagnosis not present

## 2017-01-28 DIAGNOSIS — D472 Monoclonal gammopathy: Secondary | ICD-10-CM | POA: Diagnosis not present

## 2017-01-28 DIAGNOSIS — Z7901 Long term (current) use of anticoagulants: Secondary | ICD-10-CM | POA: Diagnosis not present

## 2017-01-28 DIAGNOSIS — M25561 Pain in right knee: Secondary | ICD-10-CM | POA: Diagnosis not present

## 2017-01-28 DIAGNOSIS — K219 Gastro-esophageal reflux disease without esophagitis: Secondary | ICD-10-CM | POA: Diagnosis not present

## 2017-01-28 DIAGNOSIS — Z7982 Long term (current) use of aspirin: Secondary | ICD-10-CM

## 2017-01-28 DIAGNOSIS — M25562 Pain in left knee: Secondary | ICD-10-CM | POA: Diagnosis not present

## 2017-01-28 DIAGNOSIS — E669 Obesity, unspecified: Secondary | ICD-10-CM | POA: Diagnosis not present

## 2017-01-28 DIAGNOSIS — C9190 Lymphoid leukemia, unspecified not having achieved remission: Secondary | ICD-10-CM | POA: Diagnosis not present

## 2017-01-28 LAB — COMPREHENSIVE METABOLIC PANEL
ALT: 6 U/L — AB (ref 14–54)
AST: 14 U/L — AB (ref 15–41)
Albumin: 3.4 g/dL — ABNORMAL LOW (ref 3.5–5.0)
Alkaline Phosphatase: 102 U/L (ref 38–126)
Anion gap: 9 (ref 5–15)
BILIRUBIN TOTAL: 0.4 mg/dL (ref 0.3–1.2)
BUN: 19 mg/dL (ref 6–20)
CO2: 30 mmol/L (ref 22–32)
CREATININE: 1.04 mg/dL — AB (ref 0.44–1.00)
Calcium: 9.1 mg/dL (ref 8.9–10.3)
Chloride: 102 mmol/L (ref 101–111)
GFR calc Af Amer: 59 mL/min — ABNORMAL LOW (ref >60)
GFR, EST NON AFRICAN AMERICAN: 51 mL/min — AB (ref >60)
Glucose, Bld: 128 mg/dL — ABNORMAL HIGH (ref 65–99)
POTASSIUM: 4.2 mmol/L (ref 3.5–5.1)
Sodium: 141 mmol/L (ref 135–145)
TOTAL PROTEIN: 6.4 g/dL — AB (ref 6.5–8.1)

## 2017-01-28 LAB — LACTATE DEHYDROGENASE: LDH: 193 U/L — AB (ref 98–192)

## 2017-01-28 LAB — CBC WITH DIFFERENTIAL/PLATELET
BASOS ABS: 0.2 10*3/uL — AB (ref 0–0.1)
Basophils Relative: 1 %
Eosinophils Absolute: 0.3 10*3/uL (ref 0–0.7)
Eosinophils Relative: 1 %
HEMATOCRIT: 32.3 % — AB (ref 35.0–47.0)
Hemoglobin: 10.5 g/dL — ABNORMAL LOW (ref 12.0–16.0)
Lymphocytes Relative: 66 %
Lymphs Abs: 14.8 10*3/uL — ABNORMAL HIGH (ref 1.0–3.6)
MCH: 30 pg (ref 26.0–34.0)
MCHC: 32.4 g/dL (ref 32.0–36.0)
MCV: 92.8 fL (ref 80.0–100.0)
MONO ABS: 0.9 10*3/uL (ref 0.2–0.9)
Monocytes Relative: 4 %
NEUTROS ABS: 6.3 10*3/uL (ref 1.4–6.5)
Neutrophils Relative %: 28 %
Platelets: 415 10*3/uL (ref 150–440)
RBC: 3.48 MIL/uL — ABNORMAL LOW (ref 3.80–5.20)
RDW: 15.6 % — AB (ref 11.5–14.5)
WBC: 22.5 10*3/uL — ABNORMAL HIGH (ref 3.6–11.0)

## 2017-01-28 NOTE — Assessment & Plan Note (Addendum)
#   Massive bilateral Pulmonary Embolus- s/p Thrombolysis on Eliquis- Unprovoked/ Unclear etiology.  Continued indefinite anti-coagulation. No more new blood clots noted. Pt says IVC filter was placed prior to knee sx w/ planned removal after one year.   # Chronic low-grade B-cell lymphoproliferative disorder/CLL-  Wbc 22.5,  lymp 14.5. Hemoglobin 10.5 consistent with post-op. Platelets normal 415. Asymptomatic.Marland Kitchen Continue to monitor.   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 Present on Immunofix; Not quantifiable. Monitor for now. Will repeat labs in 6 months  # Bil knee pain; s/p right knee surgery- controlled w/ oxycodone. Walker for ambulation  # RTC in 6 months with labs prior

## 2017-01-28 NOTE — Progress Notes (Signed)
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      CLL (chronic lymphocytic leukemia) ([place])   11/21/2015 Initial Diagnosis    CLL (chronic lymphocytic leukemia) (HCC)        INTERVAL HISTORY:  [name] with above history of history of CLL- on surveillance; and history of bilateral PE- on Eliquis is here for follow-up.   In the interim, patient underwent right knee replacement with Dr. Jefm Bryant. Recovery has been uneventful and her pain is improving. She ambulates with walker today and uses oxycodone for pain.   She continues to be on anticoagulation. No bleeding. No worsening shortness of breath or cough. No hemoptysis. Denies any weight loss or night sweats or fevers. Reports bump on her left scalp but says it has been there for 'years'.   REVIEW OF SYSTEMS:  A complete 10 point review of system is done which is negative except mentioned above/history of present illness.   PAST MEDICAL HISTORY :  Past Medical History:  Diagnosis Date  . Anxiety   . Arthritis   . Back pain   . Cancer ([place])    cll  . CLL (chronic lymphocytic leukemia) ([place])   . Depression   . GERD (gastroesophageal reflux disease)   . Hemorrhoids   . Hypercholesteremia   . Hyperlipidemia   . Hypertension   . 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 MGUS (monoclonal gammopathy of unknown significance)   . Obesity   . Pulmonary embolism ([place])     PAST SURGICAL HISTORY :   Past Surgical History:  Procedure Laterality Date  . BREAST BIOPSY Left 08/19/04   lt bx/clip-neg  . COLONOSCOPY    . IVC FILTER INSERTION N/A 06/11/2016   Procedure: IVC Filter Insertion;  Surgeon: Katha Cabal, MD;  Location: [facility];  Service: Cardiovascular;  Laterality: N/A;  . JOINT REPLACEMENT Left 06/17/2016  . neck fusion    . PERIPHERAL VASCULAR CATHETERIZATION N/A 08/15/2015   Procedure: pulmonary angiogram with lysis;  Surgeon: Katha Cabal, MD;  Location: [facility];  Service: Cardiovascular;  Laterality: N/A;  . WRIST SURGERY Left 2000    FAMILY HISTORY :   Family History  Problem Relation Age of Onset  . Heart failure Father   . Congestive Heart Failure Father   . Pulmonary embolism Mother   . Breast cancer Neg Hx     SOCIAL HISTORY:   Social History  Substance Use Topics  . Smoking status: Never Smoker  . Smokeless tobacco: Never Used  . Alcohol use No    ALLERGIES:  is allergic to oxycodone and morphine.  MEDICATIONS:  Current Outpatient Prescriptions  Medication Sig Dispense Refill  . acetaminophen (TYLENOL) 650 MG CR tablet Take 650 mg by mouth every 8 (eight) hours as needed for pain.    Marland Kitchen albuterol (PROVENTIL HFA;VENTOLIN  HFA) 108 (90 Base) MCG/ACT inhaler Inhale 2 puffs into the lungs every 6 (six) hours as needed for wheezing or shortness of breath.    Marland Kitchen aspirin 81 MG tablet Take 81 mg by mouth 3 (three) times a week.     Marland Kitchen ELIQUIS 5 MG TABS tablet TAKE 1 TABLET TWICE DAILY 180 tablet 6  . escitalopram (LEXAPRO) 10 MG tablet Take 10 mg by mouth daily.    Marland Kitchen gabapentin (NEURONTIN) 300 MG capsule Take 300 mg by mouth 2 (two) times daily.     Marland Kitchen losartan-hydrochlorothiazide (HYZAAR) 100-12.5 MG tablet Take 1 tablet by mouth daily. Reported on 08/22/2015     . Multiple Vitamins-Minerals (PRESERVISION AREDS 2 PO) Take 1 capsule by mouth 2 (two) times daily.     Marland Kitchen oxyCODONE (OXY IR/ROXICODONE) 5 MG immediate release tablet Take 1 tablet by mouth every 4 (four) hours as needed for pain.  0  . pantoprazole (PROTONIX) 40 MG tablet Take 40 mg by mouth daily.    . pravastatin (PRAVACHOL) 40 MG tablet Take 40 mg by mouth daily.     . propranolol (INDERAL) 20 MG tablet Take 20 mg by mouth 2 (two) times daily.     Current Facility-Administered Medications  Medication Dose Route Frequency Provider Last Rate Last Dose  . ceFAZolin (ANCEF) IVPB 1 g/50 mL premix  1 g Intravenous Once Schnier, Dolores Lory, MD        PHYSICAL EXAMINATION:  BP (!) 120/55 (BP Location: Right Arm, Patient Position: Sitting)   Pulse (!) 56   Temp 98 F (36.7 C) (Oral)   Resp (!) 22   Ht 5\' 5"  (1.651 m)   Wt 275 lb 9.2 oz (125 kg)   SpO2 96% Comment: RA  BMI 45.86 kg/m   Filed Weights   01/28/17 1026  Weight: 275 lb 9.2 oz (125 kg)    GENERAL: Well-nourished well-developed. Alert, no distress and comfortable. Obese. Walking with a walker.  EYES: no pallor or icterus OROPHARYNX: no thrush or ulceration; good dentition  NECK: supple, no masses felt LYMPH:  no palpable lymphadenopathy in the cervical, axillary or inguinal regions LUNGS: clear to auscultation and  No wheeze or crackles HEART/CVS: regular rate & rhythm and no murmurs;  ABDOMEN:abdomen soft, non-tender and normal bowel sounds Musculoskeletal:no cyanosis of digits and no clubbing. Right knee with healing surgical wound; dressing in place. Well healed surgical scar on left knee PSYCH: alert & oriented x 3 with fluent speech NEURO: no focal motor/sensory deficits SKIN:  Multiple ecchymosis noted. Dime sized, lump on left scalp with well circumscribed margin, soft in texture.   LABORATORY DATA:  I have reviewed the data as listed    Component Value Date/Time   NA 141 01/28/2017 0959   NA 141 04/29/2012  1822   K 4.2 01/28/2017 0959   K 3.9 04/29/2012 1822   CL 102 01/28/2017 0959   CL 107 04/29/2012 1822   CO2 30 01/28/2017 0959   CO2 28 04/29/2012 1822   GLUCOSE 128 (H) 01/28/2017 0959   GLUCOSE 129 (H) 04/29/2012 1822   BUN 19 01/28/2017 0959   BUN 21 (H) 04/29/2012 1822   CREATININE 1.04 (H) 01/28/2017 0959   CREATININE 1.02 (H) 07/28/2014 0950   CALCIUM 9.1 01/28/2017 0959   CALCIUM 8.7 04/29/2012 1822   PROT 6.4 (L) 01/28/2017 0959   PROT 7.1 04/29/2012 1822   ALBUMIN 3.4 (L) 01/28/2017 0959   ALBUMIN 3.3 (L) 04/29/2012 1822   AST 14 (L)  01/28/2017 0959   AST 20 04/29/2012 1822   ALT 6 (L) 01/28/2017 0959   ALT 33 04/29/2012 1822   ALKPHOS 102 01/28/2017 0959   ALKPHOS 169 (H) 04/29/2012 1822   BILITOT 0.4 01/28/2017 0959   BILITOT 0.3 04/29/2012 1822   GFRNONAA 51 (L) 01/28/2017 0959   GFRNONAA 55 (L) 07/28/2014 0950   GFRAA 59 (L) 01/28/2017 0959   GFRAA >60 07/28/2014 0950    No results found for: SPEP, UPEP  Lab Results  Component Value Date   WBC 22.5 (H) 01/28/2017   NEUTROABS 6.3 01/28/2017   HGB 10.5 (L) 01/28/2017   HCT 32.3 (L) 01/28/2017   MCV 92.8 01/28/2017   PLT 415 01/28/2017      Chemistry      Component Value Date/Time   NA 141 01/28/2017 0959   NA 141 04/29/2012 1822   K 4.2 01/28/2017 0959   K 3.9 04/29/2012 1822   CL 102 01/28/2017 0959   CL 107 04/29/2012 1822   CO2 30 01/28/2017 0959   CO2 28 04/29/2012 1822   BUN 19 01/28/2017 0959   BUN 21 (H) 04/29/2012 1822   CREATININE 1.04 (H) 01/28/2017 0959   CREATININE 1.02 (H) 07/28/2014 0950      Component Value Date/Time   CALCIUM 9.1 01/28/2017 0959   CALCIUM 8.7 04/29/2012 1822   ALKPHOS 102 01/28/2017 0959   ALKPHOS 169 (H) 04/29/2012 1822   AST 14 (L) 01/28/2017 0959   AST 20 04/29/2012 1822   ALT 6 (L) 01/28/2017 0959   ALT 33 04/29/2012 1822   BILITOT 0.4 01/28/2017 0959   BILITOT 0.3 04/29/2012 1822        ASSESSMENT & PLAN:   CLL (chronic lymphocytic  leukemia) (HCC) # Massive bilateral Pulmonary Embolus- s/p Thrombolysis on Eliquis- Unprovoked/ Unclear etiology.  Continued indefinite anti-coagulation. No more new blood clots noted. Pt says IVC filter was placed prior to knee sx w/ planned removal after one year.   # Chronic low-grade B-cell lymphoproliferative disorder/CLL-  Wbc 22.5,  lymp 14.5. Hemoglobin 10.5 consistent with post-op. Platelets normal 415. Asymptomatic.Marland Kitchen Continue to monitor.   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 Present on Immunofix; Not quantifiable. Monitor for now. Will repeat labs in 6 months  # Bil knee pain; s/p right knee surgery- controlled w/ oxycodone. Walker for ambulation  # RTC in 6 months with labs prior    Jamie Au, NP 01/28/2017 11:33 AM  Jamie Phillips. Jamie Resides, DNP AGNP-C 01/28/17 11:33 AM

## 2017-01-28 NOTE — Progress Notes (Signed)
Patient here for followup for h/o CLL. Patient reports recent knee surgery-total knee (right). Reports chronic joint stiffness. She is currently on her Elqiuis and is also reporting a recent IV filter placement.

## 2017-01-29 DIAGNOSIS — N189 Chronic kidney disease, unspecified: Secondary | ICD-10-CM | POA: Diagnosis not present

## 2017-01-29 DIAGNOSIS — I129 Hypertensive chronic kidney disease with stage 1 through stage 4 chronic kidney disease, or unspecified chronic kidney disease: Secondary | ICD-10-CM | POA: Diagnosis not present

## 2017-01-29 DIAGNOSIS — Z471 Aftercare following joint replacement surgery: Secondary | ICD-10-CM | POA: Diagnosis not present

## 2017-01-29 DIAGNOSIS — I739 Peripheral vascular disease, unspecified: Secondary | ICD-10-CM | POA: Diagnosis not present

## 2017-01-29 DIAGNOSIS — M5090 Cervical disc disorder, unspecified, unspecified cervical region: Secondary | ICD-10-CM | POA: Diagnosis not present

## 2017-01-29 DIAGNOSIS — G8929 Other chronic pain: Secondary | ICD-10-CM | POA: Diagnosis not present

## 2017-01-30 DIAGNOSIS — Z6841 Body Mass Index (BMI) 40.0 and over, adult: Secondary | ICD-10-CM | POA: Insufficient documentation

## 2017-01-31 DIAGNOSIS — I129 Hypertensive chronic kidney disease with stage 1 through stage 4 chronic kidney disease, or unspecified chronic kidney disease: Secondary | ICD-10-CM | POA: Diagnosis not present

## 2017-01-31 DIAGNOSIS — Z471 Aftercare following joint replacement surgery: Secondary | ICD-10-CM | POA: Diagnosis not present

## 2017-01-31 DIAGNOSIS — I739 Peripheral vascular disease, unspecified: Secondary | ICD-10-CM | POA: Diagnosis not present

## 2017-01-31 DIAGNOSIS — N189 Chronic kidney disease, unspecified: Secondary | ICD-10-CM | POA: Diagnosis not present

## 2017-01-31 DIAGNOSIS — G8929 Other chronic pain: Secondary | ICD-10-CM | POA: Diagnosis not present

## 2017-01-31 DIAGNOSIS — M5090 Cervical disc disorder, unspecified, unspecified cervical region: Secondary | ICD-10-CM | POA: Diagnosis not present

## 2017-02-05 DIAGNOSIS — Z96651 Presence of right artificial knee joint: Secondary | ICD-10-CM | POA: Diagnosis not present

## 2017-02-05 DIAGNOSIS — M25661 Stiffness of right knee, not elsewhere classified: Secondary | ICD-10-CM | POA: Diagnosis not present

## 2017-02-05 DIAGNOSIS — M6281 Muscle weakness (generalized): Secondary | ICD-10-CM | POA: Diagnosis not present

## 2017-02-05 DIAGNOSIS — M25561 Pain in right knee: Secondary | ICD-10-CM | POA: Diagnosis not present

## 2017-02-10 DIAGNOSIS — M6281 Muscle weakness (generalized): Secondary | ICD-10-CM | POA: Diagnosis not present

## 2017-02-10 DIAGNOSIS — Z96651 Presence of right artificial knee joint: Secondary | ICD-10-CM | POA: Diagnosis not present

## 2017-02-10 DIAGNOSIS — M25561 Pain in right knee: Secondary | ICD-10-CM | POA: Diagnosis not present

## 2017-02-10 DIAGNOSIS — M25661 Stiffness of right knee, not elsewhere classified: Secondary | ICD-10-CM | POA: Diagnosis not present

## 2017-02-12 DIAGNOSIS — Z96651 Presence of right artificial knee joint: Secondary | ICD-10-CM | POA: Diagnosis not present

## 2017-02-12 DIAGNOSIS — M25561 Pain in right knee: Secondary | ICD-10-CM | POA: Diagnosis not present

## 2017-02-12 DIAGNOSIS — M6281 Muscle weakness (generalized): Secondary | ICD-10-CM | POA: Diagnosis not present

## 2017-02-12 DIAGNOSIS — M25661 Stiffness of right knee, not elsewhere classified: Secondary | ICD-10-CM | POA: Diagnosis not present

## 2017-02-14 DIAGNOSIS — M25661 Stiffness of right knee, not elsewhere classified: Secondary | ICD-10-CM | POA: Diagnosis not present

## 2017-02-14 DIAGNOSIS — M25561 Pain in right knee: Secondary | ICD-10-CM | POA: Diagnosis not present

## 2017-02-14 DIAGNOSIS — M6281 Muscle weakness (generalized): Secondary | ICD-10-CM | POA: Diagnosis not present

## 2017-02-14 DIAGNOSIS — Z96651 Presence of right artificial knee joint: Secondary | ICD-10-CM | POA: Diagnosis not present

## 2017-02-18 DIAGNOSIS — M25561 Pain in right knee: Secondary | ICD-10-CM | POA: Diagnosis not present

## 2017-02-18 DIAGNOSIS — M6281 Muscle weakness (generalized): Secondary | ICD-10-CM | POA: Diagnosis not present

## 2017-02-18 DIAGNOSIS — M25661 Stiffness of right knee, not elsewhere classified: Secondary | ICD-10-CM | POA: Diagnosis not present

## 2017-02-18 DIAGNOSIS — Z96651 Presence of right artificial knee joint: Secondary | ICD-10-CM | POA: Diagnosis not present

## 2017-02-19 DIAGNOSIS — M25561 Pain in right knee: Secondary | ICD-10-CM | POA: Diagnosis not present

## 2017-02-19 DIAGNOSIS — Z96651 Presence of right artificial knee joint: Secondary | ICD-10-CM | POA: Diagnosis not present

## 2017-02-19 DIAGNOSIS — M25661 Stiffness of right knee, not elsewhere classified: Secondary | ICD-10-CM | POA: Diagnosis not present

## 2017-02-19 DIAGNOSIS — M6281 Muscle weakness (generalized): Secondary | ICD-10-CM | POA: Diagnosis not present

## 2017-02-21 DIAGNOSIS — Z96651 Presence of right artificial knee joint: Secondary | ICD-10-CM | POA: Diagnosis not present

## 2017-02-21 DIAGNOSIS — M6281 Muscle weakness (generalized): Secondary | ICD-10-CM | POA: Diagnosis not present

## 2017-02-21 DIAGNOSIS — M25561 Pain in right knee: Secondary | ICD-10-CM | POA: Diagnosis not present

## 2017-02-21 DIAGNOSIS — M25661 Stiffness of right knee, not elsewhere classified: Secondary | ICD-10-CM | POA: Diagnosis not present

## 2017-02-24 DIAGNOSIS — I272 Pulmonary hypertension, unspecified: Secondary | ICD-10-CM | POA: Diagnosis not present

## 2017-02-24 DIAGNOSIS — Z6841 Body Mass Index (BMI) 40.0 and over, adult: Secondary | ICD-10-CM | POA: Diagnosis not present

## 2017-02-24 DIAGNOSIS — Z96651 Presence of right artificial knee joint: Secondary | ICD-10-CM | POA: Diagnosis not present

## 2017-02-24 DIAGNOSIS — M25561 Pain in right knee: Secondary | ICD-10-CM | POA: Diagnosis not present

## 2017-02-24 DIAGNOSIS — R0609 Other forms of dyspnea: Secondary | ICD-10-CM | POA: Diagnosis not present

## 2017-02-24 DIAGNOSIS — M25661 Stiffness of right knee, not elsewhere classified: Secondary | ICD-10-CM | POA: Diagnosis not present

## 2017-02-24 DIAGNOSIS — G4733 Obstructive sleep apnea (adult) (pediatric): Secondary | ICD-10-CM | POA: Diagnosis not present

## 2017-02-24 DIAGNOSIS — M6281 Muscle weakness (generalized): Secondary | ICD-10-CM | POA: Diagnosis not present

## 2017-02-26 DIAGNOSIS — M25561 Pain in right knee: Secondary | ICD-10-CM | POA: Diagnosis not present

## 2017-02-26 DIAGNOSIS — M6281 Muscle weakness (generalized): Secondary | ICD-10-CM | POA: Diagnosis not present

## 2017-02-26 DIAGNOSIS — M25661 Stiffness of right knee, not elsewhere classified: Secondary | ICD-10-CM | POA: Diagnosis not present

## 2017-02-26 DIAGNOSIS — Z96651 Presence of right artificial knee joint: Secondary | ICD-10-CM | POA: Diagnosis not present

## 2017-02-28 DIAGNOSIS — Z96651 Presence of right artificial knee joint: Secondary | ICD-10-CM | POA: Diagnosis not present

## 2017-02-28 DIAGNOSIS — M25661 Stiffness of right knee, not elsewhere classified: Secondary | ICD-10-CM | POA: Diagnosis not present

## 2017-02-28 DIAGNOSIS — M6281 Muscle weakness (generalized): Secondary | ICD-10-CM | POA: Diagnosis not present

## 2017-02-28 DIAGNOSIS — M25561 Pain in right knee: Secondary | ICD-10-CM | POA: Diagnosis not present

## 2017-03-03 DIAGNOSIS — R0609 Other forms of dyspnea: Secondary | ICD-10-CM | POA: Diagnosis not present

## 2017-03-05 DIAGNOSIS — M25561 Pain in right knee: Secondary | ICD-10-CM | POA: Diagnosis not present

## 2017-03-05 DIAGNOSIS — Z96651 Presence of right artificial knee joint: Secondary | ICD-10-CM | POA: Diagnosis not present

## 2017-03-05 DIAGNOSIS — M25661 Stiffness of right knee, not elsewhere classified: Secondary | ICD-10-CM | POA: Diagnosis not present

## 2017-03-05 DIAGNOSIS — M6281 Muscle weakness (generalized): Secondary | ICD-10-CM | POA: Diagnosis not present

## 2017-03-07 ENCOUNTER — Encounter: Payer: Self-pay | Admitting: *Deleted

## 2017-03-07 DIAGNOSIS — M6281 Muscle weakness (generalized): Secondary | ICD-10-CM | POA: Diagnosis not present

## 2017-03-07 DIAGNOSIS — M25561 Pain in right knee: Secondary | ICD-10-CM | POA: Diagnosis not present

## 2017-03-07 DIAGNOSIS — M25661 Stiffness of right knee, not elsewhere classified: Secondary | ICD-10-CM | POA: Diagnosis not present

## 2017-03-07 DIAGNOSIS — Z96651 Presence of right artificial knee joint: Secondary | ICD-10-CM | POA: Diagnosis not present

## 2017-03-10 ENCOUNTER — Ambulatory Visit: Payer: Medicare HMO | Admitting: Anesthesiology

## 2017-03-10 ENCOUNTER — Encounter: Payer: Self-pay | Admitting: *Deleted

## 2017-03-10 ENCOUNTER — Ambulatory Visit
Admission: RE | Admit: 2017-03-10 | Discharge: 2017-03-10 | Disposition: A | Discharge status: Home / Self Care | Payer: Medicare HMO | Source: Ambulatory Visit | Attending: Gastroenterology | Admitting: Gastroenterology

## 2017-03-10 ENCOUNTER — Encounter
Admission: RE | Disposition: A | Discharge status: Home / Self Care | Payer: Self-pay | Source: Ambulatory Visit | Attending: Gastroenterology

## 2017-03-10 DIAGNOSIS — I129 Hypertensive chronic kidney disease with stage 1 through stage 4 chronic kidney disease, or unspecified chronic kidney disease: Secondary | ICD-10-CM | POA: Diagnosis not present

## 2017-03-10 DIAGNOSIS — R1084 Generalized abdominal pain: Secondary | ICD-10-CM | POA: Insufficient documentation

## 2017-03-10 DIAGNOSIS — Z7901 Long term (current) use of anticoagulants: Secondary | ICD-10-CM | POA: Diagnosis not present

## 2017-03-10 DIAGNOSIS — Z86711 Personal history of pulmonary embolism: Secondary | ICD-10-CM | POA: Insufficient documentation

## 2017-03-10 DIAGNOSIS — Z856 Personal history of leukemia: Secondary | ICD-10-CM | POA: Insufficient documentation

## 2017-03-10 DIAGNOSIS — Z6841 Body Mass Index (BMI) 40.0 and over, adult: Secondary | ICD-10-CM | POA: Insufficient documentation

## 2017-03-10 DIAGNOSIS — E785 Hyperlipidemia, unspecified: Secondary | ICD-10-CM | POA: Diagnosis not present

## 2017-03-10 DIAGNOSIS — G4730 Sleep apnea, unspecified: Secondary | ICD-10-CM | POA: Insufficient documentation

## 2017-03-10 DIAGNOSIS — D124 Benign neoplasm of descending colon: Secondary | ICD-10-CM | POA: Diagnosis not present

## 2017-03-10 DIAGNOSIS — R1011 Right upper quadrant pain: Secondary | ICD-10-CM | POA: Diagnosis not present

## 2017-03-10 DIAGNOSIS — F329 Major depressive disorder, single episode, unspecified: Secondary | ICD-10-CM | POA: Diagnosis not present

## 2017-03-10 DIAGNOSIS — K5730 Diverticulosis of large intestine without perforation or abscess without bleeding: Secondary | ICD-10-CM | POA: Diagnosis not present

## 2017-03-10 DIAGNOSIS — N189 Chronic kidney disease, unspecified: Secondary | ICD-10-CM | POA: Insufficient documentation

## 2017-03-10 DIAGNOSIS — K635 Polyp of colon: Secondary | ICD-10-CM | POA: Diagnosis not present

## 2017-03-10 DIAGNOSIS — I10 Essential (primary) hypertension: Secondary | ICD-10-CM | POA: Diagnosis not present

## 2017-03-10 DIAGNOSIS — E7800 Pure hypercholesterolemia, unspecified: Secondary | ICD-10-CM | POA: Diagnosis not present

## 2017-03-10 DIAGNOSIS — F419 Anxiety disorder, unspecified: Secondary | ICD-10-CM | POA: Insufficient documentation

## 2017-03-10 DIAGNOSIS — Z79899 Other long term (current) drug therapy: Secondary | ICD-10-CM | POA: Insufficient documentation

## 2017-03-10 DIAGNOSIS — I739 Peripheral vascular disease, unspecified: Secondary | ICD-10-CM | POA: Diagnosis not present

## 2017-03-10 DIAGNOSIS — Z79891 Long term (current) use of opiate analgesic: Secondary | ICD-10-CM | POA: Insufficient documentation

## 2017-03-10 DIAGNOSIS — K219 Gastro-esophageal reflux disease without esophagitis: Secondary | ICD-10-CM | POA: Diagnosis not present

## 2017-03-10 DIAGNOSIS — K5790 Diverticulosis of intestine, part unspecified, without perforation or abscess without bleeding: Secondary | ICD-10-CM | POA: Diagnosis not present

## 2017-03-10 HISTORY — DX: Angina pectoris, unspecified: I20.9

## 2017-03-10 HISTORY — PX: COLONOSCOPY WITH PROPOFOL: SHX5780

## 2017-03-10 HISTORY — DX: Unspecified macular degeneration: H35.30

## 2017-03-10 HISTORY — DX: Sleep apnea, unspecified: G47.30

## 2017-03-10 HISTORY — DX: Headache: R51

## 2017-03-10 HISTORY — DX: Headache, unspecified: R51.9

## 2017-03-10 HISTORY — DX: Chronic kidney disease, unspecified: N18.9

## 2017-03-10 HISTORY — DX: Peripheral vascular disease, unspecified: I73.9

## 2017-03-10 SURGERY — COLONOSCOPY WITH PROPOFOL
Anesthesia: General

## 2017-03-10 MED ORDER — SODIUM CHLORIDE 0.9 % IV SOLN
INTRAVENOUS | Status: DC
  Administered 2017-03-10: 1000 mL via INTRAVENOUS

## 2017-03-10 MED ORDER — AMPICILLIN SODIUM 2 G IJ SOLR
INTRAMUSCULAR | Status: AC
  Filled 2017-03-10: qty 2000

## 2017-03-10 MED ORDER — PROPOFOL 500 MG/50ML IV EMUL
INTRAVENOUS | Status: AC
Start: 2017-03-10 — End: 2017-03-10
  Filled 2017-03-10: qty 50

## 2017-03-10 MED ORDER — SODIUM CHLORIDE 0.9 % IV SOLN
2.0000 g | Freq: Once | INTRAVENOUS | Status: AC
  Administered 2017-03-10: 2 g via INTRAVENOUS

## 2017-03-10 MED ORDER — PROPOFOL 500 MG/50ML IV EMUL
INTRAVENOUS | Status: DC | PRN
  Administered 2017-03-10: 120 ug/kg/min via INTRAVENOUS

## 2017-03-10 MED ORDER — LIDOCAINE HCL (CARDIAC) 20 MG/ML IV SOLN
INTRAVENOUS | Status: DC | PRN
  Administered 2017-03-10: 50 mg via INTRAVENOUS

## 2017-03-10 MED ORDER — PROPOFOL 10 MG/ML IV BOLUS
INTRAVENOUS | Status: DC | PRN
  Administered 2017-03-10 (×3): 20 mg via INTRAVENOUS
  Administered 2017-03-10: 50 mg via INTRAVENOUS
  Administered 2017-03-10: 20 mg via INTRAVENOUS

## 2017-03-10 MED ORDER — LIDOCAINE HCL (PF) 2 % IJ SOLN
INTRAMUSCULAR | Status: AC
  Filled 2017-03-10: qty 10

## 2017-03-10 NOTE — Anesthesia Preprocedure Evaluation (Signed)
Anesthesia Evaluation  Patient identified by MRN, date of birth, ID band Patient awake    Reviewed: Allergy & Precautions, NPO status , Patient's Chart, lab work & pertinent test results  Airway Mallampati: II       Dental  (+) Teeth Intact   Pulmonary sleep apnea ,     + decreased breath sounds      Cardiovascular hypertension, Pt. on medications + angina + Peripheral Vascular Disease and + DOE   Rhythm:Regular Rate:Normal     Neuro/Psych  Headaches, Anxiety Depression    GI/Hepatic Neg liver ROS, GERD  Medicated,  Endo/Other  Morbid obesity  Renal/GU      Musculoskeletal negative musculoskeletal ROS (+)   Abdominal (+) + obese,   Peds negative pediatric ROS (+)  Hematology negative hematology ROS (+)   Anesthesia Other Findings   Reproductive/Obstetrics                             Anesthesia Physical Anesthesia Plan  ASA: III  Anesthesia Plan: General   Post-op Pain Management:    Induction: Intravenous  PONV Risk Score and Plan: 0  Airway Management Planned: Natural Airway and Nasal Cannula  Additional Equipment:   Intra-op Plan:   Post-operative Plan:   Informed Consent: I have reviewed the patients History and Physical, chart, labs and discussed the procedure including the risks, benefits and alternatives for the proposed anesthesia with the patient or authorized representative who has indicated his/her understanding and acceptance.     Plan Discussed with: CRNA  Anesthesia Plan Comments:         Anesthesia Quick Evaluation

## 2017-03-10 NOTE — H&P (Signed)
Outpatient short stay form Pre-procedure 03/10/2017 11:07 AM Jamie Sails MD  Primary Physician: Dr. Frazier Richards  Reason for visit:  Colonoscopy  History of present illness:  Patient is a 76 year old female presenting today as above. She has a diffuse abdominal discomfort at times. This is burning in nature. It has been 10 years or so since her last colonoscopy. She tolerated her prep well. She does take eliquis however spelled that medication for over 3 days. She takes no aspirin products or other blood thinning agents. She tolerated her prep well.    Current Facility-Administered Medications:  .  0.9 %  sodium chloride infusion, , Intravenous, Continuous, Jamie Sails, MD, Last Rate: 20 mL/hr at 03/10/17 1002, 1,000 mL at 03/10/17 1002 .  sodium chloride 0.9 % with ampicillin (OMNIPEN) ADS Med, , , ,   Facility-Administered Medications Prior to Admission  Medication Dose Route Frequency Provider Last Rate Last Dose  . ceFAZolin (ANCEF) IVPB 1 g/50 mL premix  1 g Intravenous Once Schnier, Dolores Lory, MD       Medications Prior to Admission  Medication Sig Dispense Refill Last Dose  . albuterol (PROVENTIL HFA;VENTOLIN HFA) 108 (90 Base) MCG/ACT inhaler Inhale 2 puffs into the lungs every 6 (six) hours as needed for wheezing or shortness of breath.   Past Week at Unknown time  . escitalopram (LEXAPRO) 10 MG tablet Take 10 mg by mouth daily.   03/09/2017 at Unknown time  . gabapentin (NEURONTIN) 300 MG capsule Take 300 mg by mouth 2 (two) times daily.    03/09/2017 at Unknown time  . losartan-hydrochlorothiazide (HYZAAR) 100-12.5 MG tablet Take 1 tablet by mouth daily. Reported on 08/22/2015   03/10/2017 at Unknown time  . Multiple Vitamins-Minerals (PRESERVISION AREDS 2 PO) Take 1 capsule by mouth 2 (two) times daily.    Past Week at Unknown time  . pantoprazole (PROTONIX) 40 MG tablet Take 40 mg by mouth daily.   03/09/2017 at Unknown time  . pravastatin (PRAVACHOL) 40 MG  tablet Take 40 mg by mouth daily.    03/09/2017 at Unknown time  . propranolol (INDERAL) 20 MG tablet Take 20 mg by mouth 2 (two) times daily.   03/10/2017 at Unknown time  . [DISCONTINUED] dicyclomine (BENTYL) 10 MG capsule Take 10 mg 4 (four) times daily by mouth.     Marland Kitchen acetaminophen (TYLENOL) 650 MG CR tablet Take 650 mg by mouth every 8 (eight) hours as needed for pain.   Taking  . ELIQUIS 5 MG TABS tablet TAKE 1 TABLET TWICE DAILY 180 tablet 6 03/05/2017  . oxyCODONE (OXY IR/ROXICODONE) 5 MG immediate release tablet Take 1 tablet by mouth every 4 (four) hours as needed for pain.  0 Taking  . [DISCONTINUED] aspirin 81 MG tablet Take 81 mg by mouth 3 (three) times a week.    Taking     Allergies  Allergen Reactions  . Oxycodone Other (See Comments)    Reaction:  Hallucinations  . Morphine Other (See Comments)    Reaction:  Hallucinations     Past Medical History:  Diagnosis Date  . Anginal pain ([place])    atypical chest pain  . Anxiety   . Arthritis   . Back pain   . Cancer ([place])    cll  . Chronic kidney disease   . CLL (chronic lymphocytic leukemia) ([place])   . Depression   . GERD (gastroesophageal reflux disease)   . Headache   . Hemorrhoids   . Hypercholesteremia   .  Hyperlipidemia   . Hypertension   . 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 Macular degeneration   . MGUS (monoclonal gammopathy of unknown significance)   . Obesity   . Pulmonary embolism ([place])   . PVD (peripheral vascular disease) ([place])   . Sleep apnea     Review of systems:      Physical Exam    Heart and lungs: Regular rate and rhythm without rub or gallop, lungs are bilaterally clear.    HEENT: Normocephalic atraumatic eyes are anicteric    Other:     Pertinant exam for procedure: Soft mild discomfort palpation in the right upper quadrant. There are no masses or rebound. Bowel sounds are positive normoactive.    Planned proceedures: Colonoscopy and indicated procedures.  I have discussed the risks benefits and complications of procedures to include not limited to bleeding, infection, perforation and the risk of sedation and the patient wishes to proceed.    Jamie Sails, MD Gastroenterology 03/10/2017  11:07 AM

## 2017-03-10 NOTE — Transfer of Care (Signed)
Immediate Anesthesia Transfer of Care Note  Patient: Jamie Phillips  Procedure(s) Performed: COLONOSCOPY WITH PROPOFOL (N/A )  Patient Location: PACU and Endoscopy Unit  Anesthesia Type:General  Level of Consciousness: awake  Airway & Oxygen Therapy: Patient Spontanous Breathing  Post-op Assessment: Report given to RN and Post -op Vital signs reviewed and stable  Post vital signs: Reviewed and stable  Last Vitals:  Vitals:   03/10/17 0936 03/10/17 1153  BP: (!) 143/60 (P) 102/87  Pulse: (!) 59 (P) 69  Resp: 20 (P) 12  Temp: (!) 36.4 C (!) (P) 36.2 C  SpO2: 97% (P) 100%    Last Pain:  Vitals:   03/10/17 1153  TempSrc: (P) Tympanic         Complications: No apparent anesthesia complications

## 2017-03-10 NOTE — Anesthesia Post-op Follow-up Note (Signed)
Anesthesia QCDR form completed.        

## 2017-03-10 NOTE — Op Note (Signed)
[facility] Gastroenterology Patient Name: Jamie Phillips Procedure Date: 03/10/2017 10:56 AM MRN: [id number] Account #: [id number] Date of Birth: [birth year] Admit Type: Outpatient Age: 76 Room: [facility] ENDO ROOM 1 Gender: Female Note Status: Finalized Procedure:            Colonoscopy Indications:          Generalized abdominal pain, Abdominal pain in the right                        upper quadrant Providers:            [name], MD Referring MD:         Ocie Cornfield. Ouida Sills MD, MD (Referring MD) Medicines:            Monitored Anesthesia Care Complications:        No immediate complications. Procedure:            Pre-Anesthesia Assessment:                       - ASA Grade Assessment: III - A patient with severe                        systemic disease.                       After obtaining informed consent, the colonoscope was                        passed under direct vision. Throughout the procedure,                        the patient's blood pressure, pulse, and oxygen                        saturations were monitored continuously. The                        Colonoscope was introduced through the anus and                        advanced to the the cecum, identified by the                        appendiceal orifice, ileocecal valve and palpation. The                        colonoscopy was unusually difficult due to significant                        looping. Successful completion of the procedure was                        aided by changing the patient to a supine position,                        changing the patient to a prone position and using                        manual pressure. The patient tolerated the procedure  well. The quality of the bowel preparation was fair. Findings:      A 3 mm polyp was found in the proximal descending colon. The polyp was       sessile.      Multiple small and large-mouthed diverticula were  found in the sigmoid       colon, descending colon, transverse colon and ascending colon.      The digital rectal exam was normal.      The retroflexed view of the distal rectum and anal verge was normal and       showed no anal or rectal abnormalities. Impression:           - Preparation of the colon was fair.                       - One 3 mm polyp in the proximal descending colon.                       - Diverticulosis in the sigmoid colon, in the                        descending colon, in the transverse colon and in the                        ascending colon.                       - The distal rectum and anal verge are normal on                        retroflexion view.                       - No specimens collected. Recommendation:       - Discharge patient to home.                       - Await pathology results.                       - Telephone GI clinic for pathology results. Procedure Code(s):    --- Professional ---                       [id number], Colonoscopy, flexible; diagnostic, including                        collection of specimen(s) by brushing or washing, when                        performed (separate procedure) Diagnosis Code(s):    --- Professional ---                       D12.4, Benign neoplasm of descending colon                       R10.84, Generalized abdominal pain                       R10.11, Right upper quadrant pain                       K57.30, Diverticulosis of  large intestine without                        perforation or abscess without bleeding CPT copyright 2016 American Medical Association. All rights reserved. The codes documented in this report are preliminary and upon coder review may  be revised to meet current compliance requirements. [name], MD 03/10/2017 11:52:54 AM This report has been signed electronically. Number of Addenda: 0 Note Initiated On: 03/10/2017 10:56 AM Scope Withdrawal Time: 0 hours 8 minutes 12 seconds  Total  Procedure Duration: 0 hours 30 minutes 9 seconds       [facility]

## 2017-03-10 NOTE — Anesthesia Postprocedure Evaluation (Signed)
Anesthesia Post Note  Patient: Jamie Phillips  Procedure(s) Performed: COLONOSCOPY WITH PROPOFOL (N/A )  Patient location during evaluation: PACU Anesthesia Type: General Level of consciousness: awake Pain management: pain level controlled Vital Signs Assessment: post-procedure vital signs reviewed and stable Respiratory status: spontaneous breathing Cardiovascular status: stable Anesthetic complications: no     Last Vitals:  Vitals:   03/10/17 1213 03/10/17 1221  BP: 138/86 125/61  Pulse: (!) 56 (!) 57  Resp: 19 (!) 21  Temp:    SpO2: 100% 100%    Last Pain:  Vitals:   03/10/17 1153  TempSrc: Tympanic                 VAN STAVEREN,Jazsmine Macari

## 2017-03-11 ENCOUNTER — Encounter: Payer: Self-pay | Admitting: Gastroenterology

## 2017-03-11 LAB — SURGICAL PATHOLOGY

## 2017-03-18 DIAGNOSIS — I2510 Atherosclerotic heart disease of native coronary artery without angina pectoris: Secondary | ICD-10-CM | POA: Diagnosis not present

## 2017-03-18 DIAGNOSIS — E782 Mixed hyperlipidemia: Secondary | ICD-10-CM | POA: Diagnosis not present

## 2017-03-18 DIAGNOSIS — G4733 Obstructive sleep apnea (adult) (pediatric): Secondary | ICD-10-CM | POA: Diagnosis not present

## 2017-03-18 DIAGNOSIS — I10 Essential (primary) hypertension: Secondary | ICD-10-CM | POA: Diagnosis not present

## 2017-03-18 DIAGNOSIS — K219 Gastro-esophageal reflux disease without esophagitis: Secondary | ICD-10-CM | POA: Diagnosis not present

## 2017-03-18 DIAGNOSIS — R0609 Other forms of dyspnea: Secondary | ICD-10-CM | POA: Diagnosis not present

## 2017-03-18 DIAGNOSIS — I2699 Other pulmonary embolism without acute cor pulmonale: Secondary | ICD-10-CM | POA: Diagnosis not present

## 2017-03-18 DIAGNOSIS — N183 Chronic kidney disease, stage 3 (moderate): Secondary | ICD-10-CM | POA: Diagnosis not present

## 2017-03-18 DIAGNOSIS — I509 Heart failure, unspecified: Secondary | ICD-10-CM | POA: Diagnosis not present

## 2017-03-26 DIAGNOSIS — I509 Heart failure, unspecified: Secondary | ICD-10-CM | POA: Diagnosis not present

## 2017-03-26 DIAGNOSIS — I2510 Atherosclerotic heart disease of native coronary artery without angina pectoris: Secondary | ICD-10-CM | POA: Diagnosis not present

## 2017-03-31 ENCOUNTER — Ambulatory Visit (INDEPENDENT_AMBULATORY_CARE_PROVIDER_SITE_OTHER): Payer: Medicare HMO | Admitting: Vascular Surgery

## 2017-04-04 DIAGNOSIS — C9190 Lymphoid leukemia, unspecified not having achieved remission: Secondary | ICD-10-CM | POA: Diagnosis not present

## 2017-04-04 DIAGNOSIS — I129 Hypertensive chronic kidney disease with stage 1 through stage 4 chronic kidney disease, or unspecified chronic kidney disease: Secondary | ICD-10-CM | POA: Diagnosis not present

## 2017-04-04 DIAGNOSIS — E7800 Pure hypercholesterolemia, unspecified: Secondary | ICD-10-CM | POA: Diagnosis not present

## 2017-04-04 DIAGNOSIS — R739 Hyperglycemia, unspecified: Secondary | ICD-10-CM | POA: Diagnosis not present

## 2017-04-04 DIAGNOSIS — N183 Chronic kidney disease, stage 3 (moderate): Secondary | ICD-10-CM | POA: Diagnosis not present

## 2017-04-08 DIAGNOSIS — I2782 Chronic pulmonary embolism: Secondary | ICD-10-CM | POA: Diagnosis not present

## 2017-04-08 DIAGNOSIS — R739 Hyperglycemia, unspecified: Secondary | ICD-10-CM | POA: Diagnosis not present

## 2017-04-08 DIAGNOSIS — F325 Major depressive disorder, single episode, in full remission: Secondary | ICD-10-CM | POA: Diagnosis not present

## 2017-04-08 DIAGNOSIS — Z9989 Dependence on other enabling machines and devices: Secondary | ICD-10-CM | POA: Diagnosis not present

## 2017-04-08 DIAGNOSIS — I129 Hypertensive chronic kidney disease with stage 1 through stage 4 chronic kidney disease, or unspecified chronic kidney disease: Secondary | ICD-10-CM | POA: Diagnosis not present

## 2017-04-08 DIAGNOSIS — G4733 Obstructive sleep apnea (adult) (pediatric): Secondary | ICD-10-CM | POA: Diagnosis not present

## 2017-04-08 DIAGNOSIS — N183 Chronic kidney disease, stage 3 (moderate): Secondary | ICD-10-CM | POA: Diagnosis not present

## 2017-04-08 DIAGNOSIS — E7800 Pure hypercholesterolemia, unspecified: Secondary | ICD-10-CM | POA: Diagnosis not present

## 2017-04-17 ENCOUNTER — Encounter (INDEPENDENT_AMBULATORY_CARE_PROVIDER_SITE_OTHER): Payer: Self-pay | Admitting: Vascular Surgery

## 2017-04-17 ENCOUNTER — Ambulatory Visit (INDEPENDENT_AMBULATORY_CARE_PROVIDER_SITE_OTHER): Payer: Medicare HMO | Admitting: Vascular Surgery

## 2017-04-17 ENCOUNTER — Encounter (INDEPENDENT_AMBULATORY_CARE_PROVIDER_SITE_OTHER): Payer: Self-pay

## 2017-04-17 VITALS — BP 133/68 | HR 60 | Resp 17 | Wt 272.6 lb

## 2017-04-17 DIAGNOSIS — I872 Venous insufficiency (chronic) (peripheral): Secondary | ICD-10-CM

## 2017-04-17 DIAGNOSIS — Z86718 Personal history of other venous thrombosis and embolism: Secondary | ICD-10-CM

## 2017-04-17 DIAGNOSIS — I10 Essential (primary) hypertension: Secondary | ICD-10-CM

## 2017-04-17 DIAGNOSIS — Z6841 Body Mass Index (BMI) 40.0 and over, adult: Secondary | ICD-10-CM | POA: Diagnosis not present

## 2017-04-17 DIAGNOSIS — I2782 Chronic pulmonary embolism: Secondary | ICD-10-CM

## 2017-04-17 DIAGNOSIS — Z96651 Presence of right artificial knee joint: Secondary | ICD-10-CM | POA: Diagnosis not present

## 2017-04-18 ENCOUNTER — Encounter (INDEPENDENT_AMBULATORY_CARE_PROVIDER_SITE_OTHER): Payer: Self-pay | Admitting: Vascular Surgery

## 2017-04-18 ENCOUNTER — Other Ambulatory Visit (INDEPENDENT_AMBULATORY_CARE_PROVIDER_SITE_OTHER): Payer: Self-pay | Admitting: Vascular Surgery

## 2017-04-18 NOTE — Progress Notes (Signed)
MRN : [id number]  Jamie Phillips is a 76 y.o. (11-Dec-1940) female who presents with chief complaint of  Chief Complaint  Patient presents with  . Follow-up    81mth follow up  .  History of Present Illness:   The patient presents to the office for follow up regarding DVT. DVT was identified at [facility] by Duplex ultrasound om 06/11/2016 at which time an IVC filter was placed. The initial symptoms were pain and swelling in the lower extremity.  She is now s/p successful left and right TKR.  The patient notes her leg continues to be painful with dependency and swells quite a bite.  Symptoms are much better with elevation.  The patient notes minimal edema in the morning which steadily worsens throughout the day.    The patient has been using compression therapy at this point.  She has done very well in PT and is ambulating well.  No recent SOB or pleuritic chest pains.  No cough or hemoptysis.  No blood per rectum or blood in any sputum.  No excessive bruising per the patient.     Current Meds  Medication Sig  . acetaminophen (TYLENOL) 650 MG CR tablet Take 650 mg by mouth every 8 (eight) hours as needed for pain.  Marland Kitchen albuterol (PROVENTIL HFA;VENTOLIN HFA) 108 (90 Base) MCG/ACT inhaler Inhale 2 puffs into the lungs every 6 (six) hours as needed for wheezing or shortness of breath.  Arne Cleveland 5 MG TABS tablet TAKE 1 TABLET TWICE DAILY  . escitalopram (LEXAPRO) 10 MG tablet Take 10 mg by mouth daily.  Marland Kitchen gabapentin (NEURONTIN) 300 MG capsule Take 300 mg by mouth 2 (two) times daily.   Marland Kitchen losartan-hydrochlorothiazide (HYZAAR) 100-12.5 MG tablet Take 1 tablet by mouth daily. Reported on 08/22/2015  . Multiple Vitamins-Minerals (PRESERVISION AREDS 2 PO) Take 1 capsule by mouth 2 (two) times daily.   . pantoprazole (PROTONIX) 40 MG tablet Take 40 mg by mouth daily.  . pravastatin (PRAVACHOL) 40 MG tablet Take 40 mg by mouth daily.   . propranolol (INDERAL) 20 MG tablet Take 20 mg by mouth  2 (two) times daily.   Current Facility-Administered Medications for the 04/17/17 encounter (Office Visit) with Delana Meyer, Dolores Lory, MD  Medication  . ceFAZolin (ANCEF) IVPB 1 g/50 mL premix    Past Medical History:  Diagnosis Date  . Anginal pain ([place])    atypical chest pain  . Anxiety   . Arthritis   . Back pain   . Cancer ([place])    cll  . Chronic kidney disease   . CLL (chronic lymphocytic leukemia) ([place])   . Depression   . GERD (gastroesophageal reflux disease)   . Headache   . Hemorrhoids   . Hypercholesteremia   . Hyperlipidemia   . Hypertension   . 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 Macular degeneration   . MGUS (monoclonal gammopathy of unknown significance)   . Obesity   . Pulmonary embolism ([place])   . PVD (peripheral vascular disease) ([place])   . Sleep apnea     Past Surgical History:  Procedure Laterality Date  . BREAST BIOPSY Left 08/19/04   lt bx/clip-neg  . COLONOSCOPY    . COLONOSCOPY WITH PROPOFOL N/A 03/10/2017   Procedure: COLONOSCOPY WITH PROPOFOL;  Surgeon: [name], MD;  Location: [facility] ENDOSCOPY;  Service: Endoscopy;  Laterality: N/A;  . IVC FILTER INSERTION N/A 06/11/2016   Procedure: IVC Filter Insertion;  Surgeon: Dolores Lory  Inocente Krach, MD;  Location: [place] CV LAB;  Service: Cardiovascular;  Laterality: N/A;  . JOINT REPLACEMENT Left 06/17/2016  . neck fusion    . PERIPHERAL VASCULAR CATHETERIZATION N/A 08/15/2015   Procedure: pulmonary angiogram with lysis;  Surgeon: Katha Cabal, MD;  Location: [place] CV LAB;  Service: Cardiovascular;  Laterality: N/A;  . WRIST SURGERY Left 2000    Social History Social History   Tobacco Use  . Smoking status: Never Smoker  . Smokeless tobacco: Never Used  Substance Use Topics  . Alcohol use: No    Alcohol/week: 0.0 oz  . Drug use: No    Family History Family History  Problem Relation Age of Onset  . Heart failure Father   . Congestive Heart Failure  Father   . Pulmonary embolism Mother   . Breast cancer Neg Hx     Allergies  Allergen Reactions  . Oxycodone Other (See Comments)    Reaction:  Hallucinations  . Morphine Other (See Comments)    Reaction:  Hallucinations     REVIEW OF SYSTEMS (Negative unless checked)  Constitutional: [] Weight loss  [] Fever  [] Chills Cardiac: [] Chest pain   [] Chest pressure   [] Palpitations   [] Shortness of breath when laying flat   [x] Shortness of breath with exertion. Vascular:  [x] Pain in legs with walking   [] Pain in legs at rest  [] History of DVT   [] Phlebitis   [] Swelling in legs   [] Varicose veins   [] Non-healing ulcers Pulmonary:   [] Uses home oxygen   [] Productive cough   [] Hemoptysis   [] Wheeze  [] COPD   [] Asthma Neurologic:  [] Dizziness   [] Seizures   [] History of stroke   [] History of TIA  [] Aphasia   [] Vissual changes   [] Weakness or numbness in arm   [] Weakness or numbness in leg Musculoskeletal:   [x] Joint swelling   [x] Joint pain   [] Low back pain Hematologic:  [] Easy bruising  [] Easy bleeding   [] Hypercoagulable state   [] Anemic Gastrointestinal:  [] Diarrhea   [] Vomiting  [] Gastroesophageal reflux/heartburn   [] Difficulty swallowing. Genitourinary:  [] Chronic kidney disease   [] Difficult urination  [] Frequent urination   [] Blood in urine Skin:  [] Rashes   [] Ulcers  Psychological:  [] History of anxiety   []  History of major depression.  Physical Examination  Vitals:   04/17/17 1315  BP: 133/68  Pulse: 60  Resp: 17  Weight: 272 lb 9.6 oz (123.7 kg)   Body mass index is 45.36 kg/m. Gen: WD/WN, NAD Head: [place]/AT, No temporalis wasting.  Ear/Nose/Throat: Hearing grossly intact, nares w/o erythema or drainage Eyes: PER, EOMI, sclera nonicteric.  Neck: Supple, no large masses.   Pulmonary:  Good air movement, no audible wheezing bilaterally, no use of accessory muscles.  Cardiac: RRR, no JVD Vascular: scattered varicosities present bilaterally.  Mild venous stasis changes to the  legs bilaterally.  3+ soft pitting edema  Vessel Right Left  Radial Palpable Palpable  PT Palpable Palpable  DP Palpable Palpable  Gastrointestinal: Non-distended. No guarding/no peritoneal signs.  Musculoskeletal: M/S 5/5 throughout.  No deformity or atrophy.  Neurologic: CN 2-12 intact. Symmetrical.  Speech is fluent. Motor exam as listed above. Psychiatric: Judgment intact, Mood & affect appropriate for pt's clinical situation. Dermatologic: Venous rashes no ulcers noted.  No changes consistent with cellulitis. Lymph : No lichenification or skin changes of chronic lymphedema.  CBC Lab Results  Component Value Date   WBC 22.5 (H) 01/28/2017   HGB 10.5 (L) 01/28/2017   HCT 32.3 (L) 01/28/2017  MCV 92.8 01/28/2017   PLT 415 01/28/2017    BMET    Component Value Date/Time   NA 141 01/28/2017 0959   NA 141 04/29/2012 1822   K 4.2 01/28/2017 0959   K 3.9 04/29/2012 1822   CL 102 01/28/2017 0959   CL 107 04/29/2012 1822   CO2 30 01/28/2017 0959   CO2 28 04/29/2012 1822   GLUCOSE 128 (H) 01/28/2017 0959   GLUCOSE 129 (H) 04/29/2012 1822   BUN 19 01/28/2017 0959   BUN 21 (H) 04/29/2012 1822   CREATININE 1.04 (H) 01/28/2017 0959   CREATININE 1.02 (H) 07/28/2014 0950   CALCIUM 9.1 01/28/2017 0959   CALCIUM 8.7 04/29/2012 1822   GFRNONAA 51 (L) 01/28/2017 0959   GFRNONAA 55 (L) 07/28/2014 0950   GFRAA 59 (L) 01/28/2017 0959   GFRAA >60 07/28/2014 0950   CrCl cannot be calculated (Patient's most recent lab result is older than the maximum 21 days allowed.).  COAG Lab Results  Component Value Date   INR 1.14 08/14/2015    Radiology No results found.   Assessment/Plan 1. Other chronic pulmonary embolism without acute cor pulmonale (HCC) The patient will continue anticoagulation for now as there have not been any problems or complications at this point.  IVC filter should be removed.   Risk and benefits were reviewed the patient.  Indications for the procedure were  reviewed.  All questions were answered, the patient agrees to proceed.   I have had a long discussion with the patient regarding DVT and post phlebitic changes such as swelling and why it  causes symptoms such as pain.  The patient will wear graduated compression stockings class 1 (20-30 mmHg) on a daily basis a prescription was given. The patient will  beginning wearing the stockings first thing in the morning and removing them in the evening. The patient is instructed specifically not to sleep in the stockings.  In addition, behavioral modification including elevation during the day and avoidance of prolonged dependency will be initiated.    The patient will follow-up with me after the removal  2. Venous insufficiency No surgery or intervention at this point in time.    I have had a long discussion with the patient regarding venous insufficiency and why it  causes symptoms. I have discussed with the patient the chronic skin changes that accompany venous insufficiency and the long term sequela such as infection and ulceration.  Patient will begin wearing graduated compression stockings class 1 (20-30 mmHg) or compression wraps on a daily basis a prescription was given. The patient will put the stockings on first thing in the morning and removing them in the evening. The patient is instructed specifically not to sleep in the stockings.    In addition, behavioral modification including several periods of elevation of the lower extremities during the day will be continued. I have demonstrated that proper elevation is a position with the ankles at heart level.  The patient is instructed to begin routine exercise, especially walking on a daily basis  Following the review of the ultrasound the patient will follow up in 2-3 months to reassess the degree of swelling and the control that graduated compression stockings or compression wraps  is offering.   The patient can be assessed for a Lymph Pump at that  time  3. History of DVT in adulthood See #1 and 2  4. Essential hypertension Continue antihypertensive medications as already ordered, these medications have been reviewed and there are no  changes at this time.     Hortencia Pilar, MD  04/18/2017 10:39 AM

## 2017-05-01 DIAGNOSIS — G4733 Obstructive sleep apnea (adult) (pediatric): Secondary | ICD-10-CM | POA: Diagnosis not present

## 2017-05-05 MED ORDER — CEFAZOLIN SODIUM-DEXTROSE 2-4 GM/100ML-% IV SOLN
2.0000 g | Freq: Once | INTRAVENOUS | Status: AC
  Administered 2017-05-06: 2 g via INTRAVENOUS

## 2017-05-06 ENCOUNTER — Encounter
Admission: RE | Disposition: A | Discharge status: Home / Self Care | Payer: Self-pay | Source: Ambulatory Visit | Attending: Vascular Surgery

## 2017-05-06 ENCOUNTER — Ambulatory Visit
Admission: RE | Admit: 2017-05-06 | Discharge: 2017-05-06 | Disposition: A | Discharge status: Home / Self Care | Payer: Medicare HMO | Source: Ambulatory Visit | Attending: Vascular Surgery | Admitting: Vascular Surgery

## 2017-05-06 DIAGNOSIS — N189 Chronic kidney disease, unspecified: Secondary | ICD-10-CM | POA: Insufficient documentation

## 2017-05-06 DIAGNOSIS — E669 Obesity, unspecified: Secondary | ICD-10-CM | POA: Insufficient documentation

## 2017-05-06 DIAGNOSIS — Z452 Encounter for adjustment and management of vascular access device: Secondary | ICD-10-CM | POA: Diagnosis not present

## 2017-05-06 DIAGNOSIS — I2699 Other pulmonary embolism without acute cor pulmonale: Secondary | ICD-10-CM | POA: Diagnosis not present

## 2017-05-06 DIAGNOSIS — I82409 Acute embolism and thrombosis of unspecified deep veins of unspecified lower extremity: Secondary | ICD-10-CM

## 2017-05-06 DIAGNOSIS — E7800 Pure hypercholesterolemia, unspecified: Secondary | ICD-10-CM | POA: Diagnosis not present

## 2017-05-06 DIAGNOSIS — Z9660 Presence of unspecified orthopedic joint implant: Secondary | ICD-10-CM | POA: Insufficient documentation

## 2017-05-06 DIAGNOSIS — Z9889 Other specified postprocedural states: Secondary | ICD-10-CM | POA: Diagnosis not present

## 2017-05-06 DIAGNOSIS — I129 Hypertensive chronic kidney disease with stage 1 through stage 4 chronic kidney disease, or unspecified chronic kidney disease: Secondary | ICD-10-CM | POA: Insufficient documentation

## 2017-05-06 DIAGNOSIS — Z8249 Family history of ischemic heart disease and other diseases of the circulatory system: Secondary | ICD-10-CM | POA: Diagnosis not present

## 2017-05-06 DIAGNOSIS — Z6841 Body Mass Index (BMI) 40.0 and over, adult: Secondary | ICD-10-CM | POA: Insufficient documentation

## 2017-05-06 DIAGNOSIS — I739 Peripheral vascular disease, unspecified: Secondary | ICD-10-CM | POA: Insufficient documentation

## 2017-05-06 DIAGNOSIS — Z856 Personal history of leukemia: Secondary | ICD-10-CM | POA: Diagnosis not present

## 2017-05-06 DIAGNOSIS — M1990 Unspecified osteoarthritis, unspecified site: Secondary | ICD-10-CM | POA: Insufficient documentation

## 2017-05-06 DIAGNOSIS — I872 Venous insufficiency (chronic) (peripheral): Secondary | ICD-10-CM | POA: Diagnosis not present

## 2017-05-06 DIAGNOSIS — Z885 Allergy status to narcotic agent status: Secondary | ICD-10-CM | POA: Diagnosis not present

## 2017-05-06 DIAGNOSIS — Z981 Arthrodesis status: Secondary | ICD-10-CM | POA: Diagnosis not present

## 2017-05-06 DIAGNOSIS — D472 Monoclonal gammopathy: Secondary | ICD-10-CM | POA: Diagnosis not present

## 2017-05-06 HISTORY — PX: IVC FILTER REMOVAL: CATH118246

## 2017-05-06 SURGERY — IVC FILTER REMOVAL
Anesthesia: Moderate Sedation

## 2017-05-06 MED ORDER — MIDAZOLAM HCL 5 MG/5ML IJ SOLN
INTRAMUSCULAR | Status: AC
  Filled 2017-05-06: qty 5

## 2017-05-06 MED ORDER — SODIUM CHLORIDE 0.9 % IV SOLN: INTRAVENOUS | Status: DC

## 2017-05-06 MED ORDER — ONDANSETRON HCL 4 MG/2ML IJ SOLN: 4.0000 mg | Freq: Four times a day (QID) | INTRAMUSCULAR | Status: DC | PRN

## 2017-05-06 MED ORDER — FENTANYL CITRATE (PF) 100 MCG/2ML IJ SOLN
INTRAMUSCULAR | Status: AC
  Filled 2017-05-06: qty 2

## 2017-05-06 MED ORDER — HYDROMORPHONE HCL 1 MG/ML IJ SOLN: 1.0000 mg | Freq: Once | INTRAMUSCULAR | Status: DC | PRN

## 2017-05-06 MED ORDER — IOPAMIDOL (ISOVUE-300) INJECTION 61%
INTRAVENOUS | Status: DC | PRN
  Administered 2017-05-06: 15 mL via INTRAVENOUS

## 2017-05-06 MED ORDER — FENTANYL CITRATE (PF) 100 MCG/2ML IJ SOLN
INTRAMUSCULAR | Status: DC | PRN
  Administered 2017-05-06: 50 ug via INTRAVENOUS

## 2017-05-06 MED ORDER — HEPARIN (PORCINE) IN NACL 2-0.9 UNIT/ML-% IJ SOLN
INTRAMUSCULAR | Status: AC
  Filled 2017-05-06: qty 500

## 2017-05-06 MED ORDER — LIDOCAINE HCL (PF) 1 % IJ SOLN
INTRAMUSCULAR | Status: AC
  Filled 2017-05-06: qty 30

## 2017-05-06 MED ORDER — MIDAZOLAM HCL 2 MG/2ML IJ SOLN
INTRAMUSCULAR | Status: DC | PRN
  Administered 2017-05-06: 2 mg via INTRAVENOUS

## 2017-05-06 SURGICAL SUPPLY — 6 items
NDL ENTRY 21GA 7CM ECHOTIP (NEEDLE) IMPLANT
NEEDLE ENTRY 21GA 7CM ECHOTIP (NEEDLE) ×3 IMPLANT
PACK ANGIOGRAPHY (CUSTOM PROCEDURE TRAY) ×2 IMPLANT
SET INTRO CAPELLA COAXIAL (SET/KITS/TRAYS/PACK) ×2 IMPLANT
SET VENACAVA FILTER RETRIEVAL (MISCELLANEOUS) ×2 IMPLANT
WIRE J 3MM .035X145CM (WIRE) ×2 IMPLANT

## 2017-05-06 NOTE — Op Note (Signed)
  OPERATIVE NOTE   PRE-OPERATIVE DIAGNOSIS: DVT with PE  POST-OPERATIVE DIAGNOSIS: Same  PROCEDURE: 1. Retrieval of IVC Filter 2. Inferior Vena Cavagram  SURGEON: Katha Cabal, M.D.  ANESTHESIA:  Conscious sedation was administered under my direct supervision by the interventional radiology RN. IV Versed plus fentanyl were utilized. Continuous ECG, pulse oximetry and blood pressure was monitored throughout the entire procedure. Conscious sedation was for a total of 25 minutes.  ESTIMATED BLOOD LOSS: Minimal cc  FINDING(S):inferior vena cava is widely patent filter is in place in good position. Filter is removed without incident  SPECIMEN(S):  IVC filter intact  INDICATIONS:   Jamie Phillips is a 77 y.o. female who presents with DVT and PE. The patient has now tolerated anticoagulation for several months. Therefore, the IVC filter is recommended to be removed. The risks and benefits were reviewed with the patient all questions were answered and they agreed to proceed with IVC filter retrieval. Oral anticoagulation will be continued.  DESCRIPTION: After obtaining full informed written consent, the patient was brought back to the Special Procedure Suite and placed in the supine position.  The patient received IV antibiotics prior to induction.  After obtaining adequate sedation, the patient was prepped and draped in the standard fashion and appropriate time out is called.     Ultrasound was placed in a sterile sleeve.The right neck was then imaged with ultrasound.   Jugular vein was identified it is echolucent and homogeneous indicating patency. 1% lidocaine is then infiltrated under ultrasound visualization and subsequently a Seldinger needle is inserted under real-time ultrasound guidance.  J-wire is then advanced into the inferior vena cava under fluoroscopic guidance. With the tip of the sheath at the confluence of the iliac veins inferior vena caval imaging is performed.  After  review of the image the sheath is repositioned to above the filter and the snares introduced. Snares opened and the hook is secured without difficulty. The filter is then collapsed within the sheath and removed without difficulty.  Sheath is removed by pressures held the patient tolerated the procedure well and there were no immediate complications.  Interpretation: inferior vena cava is widely patent filter is in place in good position. Filter is removed without incident.     COMPLICATIONS: None  CONDITION: [name], M.D. [facility] Office: [phone]   05/06/2017, 11:07 AM

## 2017-05-22 ENCOUNTER — Encounter (INDEPENDENT_AMBULATORY_CARE_PROVIDER_SITE_OTHER): Payer: Self-pay | Admitting: Vascular Surgery

## 2017-05-22 ENCOUNTER — Ambulatory Visit (INDEPENDENT_AMBULATORY_CARE_PROVIDER_SITE_OTHER): Payer: Medicare HMO | Admitting: Vascular Surgery

## 2017-05-22 VITALS — BP 129/80 | HR 59 | Resp 16 | Wt 261.0 lb

## 2017-05-22 DIAGNOSIS — Z86718 Personal history of other venous thrombosis and embolism: Secondary | ICD-10-CM | POA: Diagnosis not present

## 2017-05-22 DIAGNOSIS — I872 Venous insufficiency (chronic) (peripheral): Secondary | ICD-10-CM

## 2017-05-22 DIAGNOSIS — I10 Essential (primary) hypertension: Secondary | ICD-10-CM

## 2017-05-22 DIAGNOSIS — E782 Mixed hyperlipidemia: Secondary | ICD-10-CM | POA: Diagnosis not present

## 2017-05-25 ENCOUNTER — Encounter (INDEPENDENT_AMBULATORY_CARE_PROVIDER_SITE_OTHER): Payer: Self-pay | Admitting: Vascular Surgery

## 2017-05-25 NOTE — Progress Notes (Signed)
MRN : [id number]  Jamie Phillips is a 77 y.o. (03-Feb-1941) female who presents with chief complaint of  Chief Complaint  Patient presents with  . Follow-up    2week follow up  .  History of Present Illness:  The patient presents to the office for evaluation of DVT.  DVT was identified at [facility] by Duplex ultrasound.  The initial symptoms were pain and swelling in the lower extremity.  The patient notes the leg continues to be very painful with dependency and swells quite a bite.  Symptoms are much better with elevation.  The patient notes minimal edema in the morning which steadily worsens throughout the day.    IVC filter was removed successfully on 05/06/2017  The patient has not been using compression therapy at this point.  No SOB or pleuritic chest pains.  No cough or hemoptysis.  No blood per rectum or blood in any sputum.  No excessive bruising per the patient.      Current Meds  Medication Sig  . acetaminophen (TYLENOL) 650 MG CR tablet Take 650-1,300 mg by mouth every 8 (eight) hours as needed for pain. TYLENOL ARTHRITIS  . albuterol (PROVENTIL HFA;VENTOLIN HFA) 108 (90 Base) MCG/ACT inhaler Inhale 2 puffs into the lungs every 6 (six) hours as needed for wheezing or shortness of breath.  Arne Cleveland 5 MG TABS tablet TAKE 1 TABLET TWICE DAILY  . escitalopram (LEXAPRO) 10 MG tablet Take 10 mg by mouth daily.  Marland Kitchen gabapentin (NEURONTIN) 300 MG capsule Take 300 mg by mouth 3 (three) times daily.   Marland Kitchen losartan-hydrochlorothiazide (HYZAAR) 100-12.5 MG tablet Take 1 tablet by mouth daily. Reported on 08/22/2015  . Multiple Vitamins-Minerals (PRESERVISION AREDS 2 PO) Take 1 capsule by mouth 2 (two) times daily.   . pantoprazole (PROTONIX) 40 MG tablet Take 40 mg by mouth daily.  . pravastatin (PRAVACHOL) 40 MG tablet Take 40 mg by mouth daily.   . propranolol (INDERAL) 20 MG tablet Take 20 mg by mouth 2 (two) times daily.   Current Facility-Administered Medications for the 05/22/17  encounter (Office Visit) with Delana Meyer, Dolores Lory, MD  Medication  . ceFAZolin (ANCEF) IVPB 1 g/50 mL premix    Past Medical History:  Diagnosis Date  . Anginal pain ([place])    atypical chest pain  . Anxiety   . Arthritis   . Back pain   . Cancer ([place])    cll  . Chronic kidney disease   . CLL (chronic lymphocytic leukemia) ([place])   . Depression   . GERD (gastroesophageal reflux disease)   . Headache   . Hemorrhoids   . Hypercholesteremia   . Hyperlipidemia   . Hypertension   . 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 Macular degeneration   . MGUS (monoclonal gammopathy of unknown significance)   . Obesity   . Pulmonary embolism ([place])   . PVD (peripheral vascular disease) ([place])   . Sleep apnea     Past Surgical History:  Procedure Laterality Date  . BREAST BIOPSY Left 08/19/04   lt bx/clip-neg  . COLONOSCOPY    . COLONOSCOPY WITH PROPOFOL N/A 03/10/2017   Procedure: COLONOSCOPY WITH PROPOFOL;  Surgeon: [name], MD;  Location[facility] ENDOSCOPY;  Service: Endoscopy;  Laterality: N/A;  . IVC FILTER INSERTION N/A 06/11/2016   Procedure: IVC Filter Insertion;  Surgeon: Katha Cabal, MD;  Location: [place] CV LAB;  Service: Cardiovascular;  Laterality: N/A;  . IVC FILTER REMOVAL N/A  05/06/2017   Procedure: IVC FILTER REMOVAL;  Surgeon: Katha Cabal, MD;  Location: [place] CV LAB;  Service: Cardiovascular;  Laterality: N/A;  . JOINT REPLACEMENT Left 06/17/2016  . neck fusion    . PERIPHERAL VASCULAR CATHETERIZATION N/A 08/15/2015   Procedure: pulmonary angiogram with lysis;  Surgeon: Katha Cabal, MD;  Location: [place] CV LAB;  Service: Cardiovascular;  Laterality: N/A;  . WRIST SURGERY Left 2000    Social History Social History   Tobacco Use  . Smoking status: Never Smoker  . Smokeless tobacco: Never Used  Substance Use Topics  . Alcohol use: No    Alcohol/week: 0.0 oz  . Drug use: No    Family History Family  History  Problem Relation Age of Onset  . Heart failure Father   . Congestive Heart Failure Father   . Pulmonary embolism Mother   . Breast cancer Neg Hx     Allergies  Allergen Reactions  . Oxycodone Other (See Comments)    Reaction:  Hallucinations  . Morphine Other (See Comments)    Reaction:  Hallucinations     REVIEW OF SYSTEMS (Negative unless checked)  Constitutional: [] Weight loss  [] Fever  [] Chills Cardiac: [] Chest pain   [] Chest pressure   [] Palpitations   [] Shortness of breath when laying flat   [] Shortness of breath with exertion. Vascular:  [] Pain in legs with walking   [x] Pain in legs with standing[x] History of DVT   [x] Phlebitis   [] Swelling in legs   [] Varicose veins   [] Non-healing ulcers Pulmonary:   [] Uses home oxygen   [] Productive cough   [] Hemoptysis   [] Wheeze  [] COPD   [] Asthma Neurologic:  [] Dizziness   [] Seizures   [] History of stroke   [] History of TIA  [] Aphasia   [] Vissual changes   [] Weakness or numbness in arm   [] Weakness or numbness in leg Musculoskeletal:   [] Joint swelling   [] Joint pain   [] Low back pain Hematologic:  [] Easy bruising  [] Easy bleeding   [] Hypercoagulable state   [] Anemic Gastrointestinal:  [] Diarrhea   [] Vomiting  [] Gastroesophageal reflux/heartburn   [] Difficulty swallowing. Genitourinary:  [] Chronic kidney disease   [] Difficult urination  [] Frequent urination   [] Blood in urine Skin:  [] Rashes   [] Ulcers  Psychological:  [] History of anxiety   []  History of major depression.  Physical Examination  Vitals:   05/22/17 1117  BP: 129/80  Pulse: (!) 59  Resp: 16  Weight: 261 lb (118.4 kg)   Body mass index is 43.43 kg/m. Gen: WD/WN, NAD Head: [place]/AT, No temporalis wasting.  Ear/Nose/Throat: Hearing grossly intact, nares w/o erythema or drainage Eyes: PER, EOMI, sclera nonicteric.  Neck: Supple, no large masses.   Pulmonary:  Good air movement, no audible wheezing bilaterally, no use of accessory muscles.  Cardiac: RRR, no  JVD Vascular: scattered varicosities present bilaterally.  Moderate venous stasis changes to the legs bilaterally.  2-3+ soft pitting edema Vessel Right Left  Radial Palpable Palpable  PT Palpable Palpable  DP Palpable Palpable  Gastrointestinal: Non-distended. No guarding/no peritoneal signs.  Musculoskeletal: M/S 5/5 throughout.  No deformity or atrophy.  Neurologic: CN 2-12 intact. Symmetrical.  Speech is fluent. Motor exam as listed above. Psychiatric: Judgment intact, Mood & affect appropriate for pt's clinical situation. Dermatologic: venous rashes no ulcers noted.  No changes consistent with cellulitis. Lymph : No lichenification or skin changes of chronic lymphedema.  CBC Lab Results  Component Value Date   WBC 22.5 (H) 01/28/2017   HGB 10.5 (L)  01/28/2017   HCT 32.3 (L) 01/28/2017   MCV 92.8 01/28/2017   PLT 415 01/28/2017    BMET    Component Value Date/Time   NA 141 01/28/2017 0959   NA 141 04/29/2012 1822   K 4.2 01/28/2017 0959   K 3.9 04/29/2012 1822   CL 102 01/28/2017 0959   CL 107 04/29/2012 1822   CO2 30 01/28/2017 0959   CO2 28 04/29/2012 1822   GLUCOSE 128 (H) 01/28/2017 0959   GLUCOSE 129 (H) 04/29/2012 1822   BUN 19 01/28/2017 0959   BUN 21 (H) 04/29/2012 1822   CREATININE 1.04 (H) 01/28/2017 0959   CREATININE 1.02 (H) 07/28/2014 0950   CALCIUM 9.1 01/28/2017 0959   CALCIUM 8.7 04/29/2012 1822   GFRNONAA 51 (L) 01/28/2017 0959   GFRNONAA 55 (L) 07/28/2014 0950   GFRAA 59 (L) 01/28/2017 0959   GFRAA >60 07/28/2014 0950   CrCl cannot be calculated (Patient's most recent lab result is older than the maximum 21 days allowed.).  COAG Lab Results  Component Value Date   INR 1.14 08/14/2015    Radiology No results found.  Assessment/Plan 1. Venous insufficiency No surgery or intervention at this point in time.    I have had a long discussion with the patient regarding venous insufficiency and why it  causes symptoms. I have discussed with  the patient the chronic skin changes that accompany venous insufficiency and the long term sequela such as infection and ulceration.  Patient will begin wearing graduated compression stockings class 1 (20-30 mmHg) or compression wraps on a daily basis a prescription was given. The patient will put the stockings on first thing in the morning and removing them in the evening. The patient is instructed specifically not to sleep in the stockings.    In addition, behavioral modification including several periods of elevation of the lower extremities during the day will be continued. I have demonstrated that proper elevation is a position with the ankles at heart level.  The patient is instructed to begin routine exercise, especially walking on a daily basis  The patient will follow up PRN to reassess the degree of swelling and the control that graduated compression stockings or compression wraps  is offering.   The patient can be assessed for a Lymph Pump at that time  2. Essential hypertension Continue antihypertensive medications as already ordered, these medications have been reviewed and there are no changes at this time.   3. Mixed hyperlipidemia Continue statin as ordered and reviewed, no changes at this time   4. History of DVT in adulthood See #1    Hortencia Pilar, MD  05/25/2017 10:28 AM

## 2017-05-27 DIAGNOSIS — R0602 Shortness of breath: Secondary | ICD-10-CM | POA: Diagnosis not present

## 2017-05-27 DIAGNOSIS — Z6841 Body Mass Index (BMI) 40.0 and over, adult: Secondary | ICD-10-CM | POA: Diagnosis not present

## 2017-05-27 DIAGNOSIS — Z9989 Dependence on other enabling machines and devices: Secondary | ICD-10-CM | POA: Diagnosis not present

## 2017-05-27 DIAGNOSIS — C9190 Lymphoid leukemia, unspecified not having achieved remission: Secondary | ICD-10-CM | POA: Diagnosis not present

## 2017-05-27 DIAGNOSIS — G4733 Obstructive sleep apnea (adult) (pediatric): Secondary | ICD-10-CM | POA: Diagnosis not present

## 2017-05-27 DIAGNOSIS — Z86711 Personal history of pulmonary embolism: Secondary | ICD-10-CM | POA: Diagnosis not present

## 2017-07-17 DIAGNOSIS — Z96651 Presence of right artificial knee joint: Secondary | ICD-10-CM | POA: Diagnosis not present

## 2017-07-17 DIAGNOSIS — Z6841 Body Mass Index (BMI) 40.0 and over, adult: Secondary | ICD-10-CM | POA: Diagnosis not present

## 2017-07-29 ENCOUNTER — Inpatient Hospital Stay: Payer: Medicare HMO | Attending: Internal Medicine | Admitting: Internal Medicine

## 2017-07-29 ENCOUNTER — Encounter: Payer: Self-pay | Admitting: Internal Medicine

## 2017-07-29 ENCOUNTER — Telehealth: Payer: Self-pay | Admitting: Internal Medicine

## 2017-07-29 ENCOUNTER — Inpatient Hospital Stay: Payer: Medicare HMO

## 2017-07-29 VITALS — BP 108/74 | HR 61 | Temp 97.4°F | Resp 16 | Wt 268.5 lb

## 2017-07-29 DIAGNOSIS — F329 Major depressive disorder, single episode, unspecified: Secondary | ICD-10-CM | POA: Insufficient documentation

## 2017-07-29 DIAGNOSIS — C9110 Chronic lymphocytic leukemia of B-cell type not having achieved remission: Secondary | ICD-10-CM

## 2017-07-29 DIAGNOSIS — Z7901 Long term (current) use of anticoagulants: Secondary | ICD-10-CM | POA: Diagnosis not present

## 2017-07-29 DIAGNOSIS — G4730 Sleep apnea, unspecified: Secondary | ICD-10-CM | POA: Insufficient documentation

## 2017-07-29 DIAGNOSIS — M549 Dorsalgia, unspecified: Secondary | ICD-10-CM | POA: Diagnosis not present

## 2017-07-29 DIAGNOSIS — E7800 Pure hypercholesterolemia, unspecified: Secondary | ICD-10-CM | POA: Diagnosis not present

## 2017-07-29 DIAGNOSIS — N189 Chronic kidney disease, unspecified: Secondary | ICD-10-CM | POA: Insufficient documentation

## 2017-07-29 DIAGNOSIS — M129 Arthropathy, unspecified: Secondary | ICD-10-CM | POA: Diagnosis not present

## 2017-07-29 DIAGNOSIS — Z79899 Other long term (current) drug therapy: Secondary | ICD-10-CM | POA: Insufficient documentation

## 2017-07-29 DIAGNOSIS — Z86711 Personal history of pulmonary embolism: Secondary | ICD-10-CM | POA: Insufficient documentation

## 2017-07-29 DIAGNOSIS — I739 Peripheral vascular disease, unspecified: Secondary | ICD-10-CM | POA: Insufficient documentation

## 2017-07-29 DIAGNOSIS — C9190 Lymphoid leukemia, unspecified not having achieved remission: Secondary | ICD-10-CM | POA: Diagnosis not present

## 2017-07-29 DIAGNOSIS — E785 Hyperlipidemia, unspecified: Secondary | ICD-10-CM | POA: Insufficient documentation

## 2017-07-29 DIAGNOSIS — I209 Angina pectoris, unspecified: Secondary | ICD-10-CM | POA: Diagnosis not present

## 2017-07-29 DIAGNOSIS — I129 Hypertensive chronic kidney disease with stage 1 through stage 4 chronic kidney disease, or unspecified chronic kidney disease: Secondary | ICD-10-CM | POA: Insufficient documentation

## 2017-07-29 DIAGNOSIS — F419 Anxiety disorder, unspecified: Secondary | ICD-10-CM | POA: Diagnosis not present

## 2017-07-29 DIAGNOSIS — E669 Obesity, unspecified: Secondary | ICD-10-CM | POA: Insufficient documentation

## 2017-07-29 DIAGNOSIS — D472 Monoclonal gammopathy: Secondary | ICD-10-CM | POA: Diagnosis not present

## 2017-07-29 DIAGNOSIS — Z86718 Personal history of other venous thrombosis and embolism: Secondary | ICD-10-CM | POA: Insufficient documentation

## 2017-07-29 LAB — CBC WITH DIFFERENTIAL/PLATELET
BASOS ABS: 0.1 10*3/uL (ref 0–0.1)
BASOS PCT: 1 %
Eosinophils Absolute: 0.3 10*3/uL (ref 0–0.7)
Eosinophils Relative: 2 %
HEMATOCRIT: 38.5 % (ref 35.0–47.0)
HEMOGLOBIN: 12.6 g/dL (ref 12.0–16.0)
Lymphocytes Relative: 69 %
Lymphs Abs: 11.4 10*3/uL — ABNORMAL HIGH (ref 1.0–3.6)
MCH: 29.8 pg (ref 26.0–34.0)
MCHC: 32.7 g/dL (ref 32.0–36.0)
MCV: 91.1 fL (ref 80.0–100.0)
Monocytes Absolute: 0.7 10*3/uL (ref 0.2–0.9)
Monocytes Relative: 4 %
NEUTROS ABS: 4 10*3/uL (ref 1.4–6.5)
NEUTROS PCT: 24 %
Platelets: 242 10*3/uL (ref 150–440)
RBC: 4.23 MIL/uL (ref 3.80–5.20)
RDW: 16.8 % — AB (ref 11.5–14.5)
WBC: 16.5 10*3/uL — AB (ref 3.6–11.0)

## 2017-07-29 LAB — COMPREHENSIVE METABOLIC PANEL
ALK PHOS: 112 U/L (ref 38–126)
ALT: 12 U/L — ABNORMAL LOW (ref 14–54)
ANION GAP: 8 (ref 5–15)
AST: 17 U/L (ref 15–41)
Albumin: 3.7 g/dL (ref 3.5–5.0)
BUN: 20 mg/dL (ref 6–20)
CALCIUM: 9 mg/dL (ref 8.9–10.3)
CO2: 31 mmol/L (ref 22–32)
Chloride: 103 mmol/L (ref 101–111)
Creatinine, Ser: 1.24 mg/dL — ABNORMAL HIGH (ref 0.44–1.00)
GFR calc non Af Amer: 41 mL/min — ABNORMAL LOW (ref >60)
GFR, EST AFRICAN AMERICAN: 48 mL/min — AB (ref >60)
Glucose, Bld: 127 mg/dL — ABNORMAL HIGH (ref 65–99)
POTASSIUM: 3.9 mmol/L (ref 3.5–5.1)
Sodium: 142 mmol/L (ref 135–145)
Total Bilirubin: 0.6 mg/dL (ref 0.3–1.2)
Total Protein: 6.7 g/dL (ref 6.5–8.1)

## 2017-07-29 LAB — LACTATE DEHYDROGENASE: LDH: 176 U/L (ref 98–192)

## 2017-07-29 NOTE — Progress Notes (Signed)
[place] OFFICE PROGRESS NOTE  Patient Care Team: Kirk Ruths, MD as PCP - General (Internal Medicine) Kirk Ruths, MD (Internal Medicine) Bary Castilla Forest Gleason, MD (General Surgery); Dr.harold Jefm Bryant;    SUMMARY OF ONCOLOGIC HISTORY:  Oncology History   # April 2017- Bil Massive Pulmonary embolus/R LE DVT- s/p Thrombolysis [Dr.Schnier, [facility]]-Eliquis; NEG factor V leide/ Prothrombin gene. S/p IV Filter explantation.   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  2) CD56 expression on monocytes.  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      CLL (chronic lymphocytic leukemia) (HCC)     INTERVAL HISTORY:  A pleasant 77 year old female patient with above history of History of CLL- on surveillance; and history of bilateral PE- on Eliquis is here for follow-up.  In the interim patient had her IVC filter explanted.   She continues to be on anticoagulation. No bleeding. No worsening shortness of breath or cough. No hemoptysis. Denies any weight loss or night sweats or fevers. Denies new lumps or bumps.   REVIEW OF SYSTEMS:  A complete 10 point review of system is done which is negative except mentioned above/history of present illness.   PAST MEDICAL HISTORY :  Past Medical History:  Diagnosis Date  . Anginal pain ([place])    atypical chest pain  . Anxiety   . Arthritis   . Back pain   . Cancer ([place])    cll  . Chronic kidney disease   . CLL (chronic lymphocytic leukemia) ([place])   . Depression   . GERD (gastroesophageal reflux disease)   . Headache   .  Hemorrhoids   . Hypercholesteremia   . Hyperlipidemia   . Hypertension   . 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 Macular degeneration   . MGUS (monoclonal gammopathy of unknown significance)   . Obesity   . Pulmonary embolism ([place])   . PVD (peripheral vascular disease) ([place])   . Sleep apnea     PAST SURGICAL HISTORY :   Past Surgical History:  Procedure Laterality Date  . BREAST BIOPSY Left 08/19/04   lt bx/clip-neg  . COLONOSCOPY    . COLONOSCOPY WITH PROPOFOL N/A 03/10/2017   Procedure: COLONOSCOPY WITH PROPOFOL;  Surgeon: [name], MD;  Location: [facility] ENDOSCOPY;  Service: Endoscopy;  Laterality: N/A;  . IVC FILTER INSERTION N/A 06/11/2016   Procedure: IVC Filter Insertion;  Surgeon: Katha Cabal, MD;  Location: [place] CV LAB;  Service: Cardiovascular;  Laterality: N/A;  . IVC FILTER REMOVAL N/A 05/06/2017   Procedure: IVC FILTER REMOVAL;  Surgeon: Katha Cabal, MD;  Location: [place] [facility];  Service: Cardiovascular;  Laterality: N/A;  . JOINT REPLACEMENT Left 06/17/2016  . neck fusion    . PERIPHERAL VASCULAR CATHETERIZATION N/A 08/15/2015   Procedure: pulmonary angiogram with lysis;  Surgeon: Katha Cabal, MD;  Location: [place] [facility];  Service: Cardiovascular;  Laterality: N/A;  . WRIST SURGERY Left 2000    FAMILY HISTORY :   Family History  Problem Relation Age of Onset  . Heart failure Father   . Congestive Heart Failure Father   . Pulmonary  embolism Mother   . Breast cancer Neg Hx     SOCIAL HISTORY:   Social History   Tobacco Use  . Smoking status: Never Smoker  . Smokeless tobacco: Never Used  Substance Use Topics  . Alcohol use: No    Alcohol/week: 0.0 oz  . Drug use: No    ALLERGIES:  is allergic to oxycodone and morphine.  MEDICATIONS:  Current Outpatient Medications  Medication Sig Dispense Refill  . acetaminophen (TYLENOL) 650 MG CR tablet Take 650-1,300 mg by mouth every 8 (eight)  hours as needed for pain. TYLENOL ARTHRITIS    . albuterol (PROVENTIL HFA;VENTOLIN HFA) 108 (90 Base) MCG/ACT inhaler Inhale 2 puffs into the lungs every 6 (six) hours as needed for wheezing or shortness of breath.    Arne Cleveland 5 MG TABS tablet TAKE 1 TABLET TWICE DAILY 180 tablet 6  . escitalopram (LEXAPRO) 10 MG tablet Take 10 mg by mouth daily.    Marland Kitchen gabapentin (NEURONTIN) 300 MG capsule Take 300 mg by mouth 3 (three) times daily.     Marland Kitchen losartan-hydrochlorothiazide (HYZAAR) 100-12.5 MG tablet Take 1 tablet by mouth daily. Reported on 08/22/2015    . Multiple Vitamins-Minerals (PRESERVISION AREDS 2 PO) Take 1 capsule by mouth 2 (two) times daily.     . pantoprazole (PROTONIX) 40 MG tablet Take 40 mg by mouth daily.    . pravastatin (PRAVACHOL) 40 MG tablet Take 40 mg by mouth daily.     . propranolol (INDERAL) 20 MG tablet Take 20 mg by mouth 2 (two) times daily.     Current Facility-Administered Medications  Medication Dose Route Frequency Provider Last Rate Last Dose  . ceFAZolin (ANCEF) IVPB 1 g/50 mL premix  1 g Intravenous Once Schnier, Dolores Lory, MD        PHYSICAL EXAMINATION:   BP 108/74 (BP Location: Left Arm, Patient Position: Sitting)   Pulse 61   Temp (!) 97.4 F (36.3 C) (Tympanic)   Resp 16   Wt 268 lb 8.3 oz (121.8 kg)   BMI 44.68 kg/m   Filed Weights   07/29/17 1043  Weight: 268 lb 8.3 oz (121.8 kg)    GENERAL: Well-nourished well-developed; Alert, no distress and comfortable.   Obese. Walking by herself. EYES: no pallor or icterus OROPHARYNX: no thrush or ulceration; good dentition  NECK: supple, no masses felt LYMPH:  no palpable lymphadenopathy in the cervical, axillary or inguinal regions LUNGS: clear to auscultation and  No wheeze or crackles HEART/CVS: regular rate & rhythm and no murmurs;  ABDOMEN:abdomen soft, non-tender and normal bowel sounds Musculoskeletal:no cyanosis of digits and no clubbing.  PSYCH: alert & oriented x 3 with fluent  speech NEURO: no focal motor/sensory deficits SKIN:  Multiple ecchymosis noted.  LABORATORY DATA:  I have reviewed the data as listed    Component Value Date/Time   NA 142 07/29/2017 1030   NA 141 04/29/2012 1822   K 3.9 07/29/2017 1030   K 3.9 04/29/2012 1822   CL 103 07/29/2017 1030   CL 107 04/29/2012 1822   CO2 31 07/29/2017 1030   CO2 28 04/29/2012 1822   GLUCOSE 127 (H) 07/29/2017 1030   GLUCOSE 129 (H) 04/29/2012 1822   BUN 20 07/29/2017 1030   BUN 21 (H) 04/29/2012 1822   CREATININE 1.24 (H) 07/29/2017 1030   CREATININE 1.02 (H) 07/28/2014 0950   CALCIUM 9.0 07/29/2017 1030   CALCIUM 8.7 04/29/2012 1822   PROT 6.7 07/29/2017 1030   PROT 7.1 04/29/2012  1822   ALBUMIN 3.7 07/29/2017 1030   ALBUMIN 3.3 (L) 04/29/2012 1822   AST 17 07/29/2017 1030   AST 20 04/29/2012 1822   ALT 12 (L) 07/29/2017 1030   ALT 33 04/29/2012 1822   ALKPHOS 112 07/29/2017 1030   ALKPHOS 169 (H) 04/29/2012 1822   BILITOT 0.6 07/29/2017 1030   BILITOT 0.3 04/29/2012 1822   GFRNONAA 41 (L) 07/29/2017 1030   GFRNONAA 55 (L) 07/28/2014 0950   GFRAA 48 (L) 07/29/2017 1030   GFRAA >60 07/28/2014 0950    No results found for: SPEP, UPEP  Lab Results  Component Value Date   WBC 16.5 (H) 07/29/2017   NEUTROABS 4.0 07/29/2017   HGB 12.6 07/29/2017   HCT 38.5 07/29/2017   MCV 91.1 07/29/2017   PLT 242 07/29/2017      Chemistry      Component Value Date/Time   NA 142 07/29/2017 1030   NA 141 04/29/2012 1822   K 3.9 07/29/2017 1030   K 3.9 04/29/2012 1822   CL 103 07/29/2017 1030   CL 107 04/29/2012 1822   CO2 31 07/29/2017 1030   CO2 28 04/29/2012 1822   BUN 20 07/29/2017 1030   BUN 21 (H) 04/29/2012 1822   CREATININE 1.24 (H) 07/29/2017 1030   CREATININE 1.02 (H) 07/28/2014 0950      Component Value Date/Time   CALCIUM 9.0 07/29/2017 1030   CALCIUM 8.7 04/29/2012 1822   ALKPHOS 112 07/29/2017 1030   ALKPHOS 169 (H) 04/29/2012 1822   AST 17 07/29/2017 1030   AST 20  04/29/2012 1822   ALT 12 (L) 07/29/2017 1030   ALT 33 04/29/2012 1822   BILITOT 0.6 07/29/2017 1030   BILITOT 0.3 04/29/2012 1822        ASSESSMENT & PLAN:   CLL (chronic lymphocytic leukemia) ([place]) # Massive bilateral Pulmonary Embolus- s/p Thrombolysis on Eliquis- Unprovoked/ Unclear etiology.  continue indefinite anti-coagulation with eliquis [will check with [facility] if she could get the drug cheaper].  Patient currently status post IVC filter explantation.  #Discussed with the patient that she should avoid falls especially given that she is on blood thinners.  Also inform us if she has any bleeding issues.  # Chronic low-grade B-cell lymphoproliferative disorder/CLL-  Wbc 16,  lymp 14.5. Hemoglobin 12/normal platelets.  Asymptomatic continue to monitor.  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 Present on Immunofix; Not quantifiable. Monitor for now. Awaiting labs today.  We will plan to check on annual basis.   #Slightly elevated creatinine at 1.2; recommend increased fluid intake.   # RTC in 6 months with labs prior.     Cammie Sickle, MD 07/29/2017 11:29 AM

## 2017-07-29 NOTE — Assessment & Plan Note (Addendum)
#   Massive bilateral Pulmonary Embolus- s/p Thrombolysis on Eliquis- Unprovoked/ Unclear etiology.  continue indefinite anti-coagulation with eliquis [will check with [facility] if she could get the drug cheaper].  Patient currently status post IVC filter explantation.  #Discussed with the patient that she should avoid falls especially given that she is on blood thinners.  Also inform us if she has any bleeding issues.  # Chronic low-grade B-cell lymphoproliferative disorder/CLL-  Wbc 16,  lymp 14.5. Hemoglobin 12/normal platelets.  Asymptomatic continue to monitor.  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 Present on Immunofix; Not quantifiable. Monitor for now. Awaiting labs today.  We will plan to check on annual basis.   #Slightly elevated creatinine at 1.2; recommend increased fluid intake.   # RTC in 6 months with labs prior.

## 2017-07-29 NOTE — Telephone Encounter (Signed)
Jamie Phillips- Please check if you can help with patient's Eliquis; as per patient it is quite expensive for her.  She clearly understands that we may not be able to help her financially. Thx

## 2017-07-30 LAB — KAPPA/LAMBDA LIGHT CHAINS
KAPPA, LAMDA LIGHT CHAIN RATIO: 4.96 — AB (ref 0.26–1.65)
Kappa free light chain: 36.7 mg/L — ABNORMAL HIGH (ref 3.3–19.4)
Lambda free light chains: 7.4 mg/L (ref 5.7–26.3)

## 2017-08-04 LAB — MULTIPLE MYELOMA PANEL, SERUM
ALBUMIN/GLOB SERPL: 1.3 (ref 0.7–1.7)
ALPHA2 GLOB SERPL ELPH-MCNC: 0.9 g/dL (ref 0.4–1.0)
Albumin SerPl Elph-Mcnc: 3.5 g/dL (ref 2.9–4.4)
Alpha 1: 0.2 g/dL (ref 0.0–0.4)
B-Globulin SerPl Elph-Mcnc: 1.2 g/dL (ref 0.7–1.3)
Gamma Glob SerPl Elph-Mcnc: 0.5 g/dL (ref 0.4–1.8)
Globulin, Total: 2.7 g/dL (ref 2.2–3.9)
IGA: 238 mg/dL (ref 64–422)
IGG (IMMUNOGLOBIN G), SERUM: 566 mg/dL — AB (ref 700–1600)
IGM (IMMUNOGLOBULIN M), SRM: 42 mg/dL (ref 26–217)
M Protein SerPl Elph-Mcnc: 0.4 g/dL — ABNORMAL HIGH
TOTAL PROTEIN ELP: 6.2 g/dL (ref 6.0–8.5)

## 2017-08-27 ENCOUNTER — Other Ambulatory Visit: Payer: Self-pay | Admitting: Internal Medicine

## 2017-08-27 DIAGNOSIS — Z1231 Encounter for screening mammogram for malignant neoplasm of breast: Secondary | ICD-10-CM

## 2017-09-24 ENCOUNTER — Ambulatory Visit
Admission: RE | Admit: 2017-09-24 | Discharge: 2017-09-24 | Disposition: A | Discharge status: Home / Self Care | Payer: Medicare HMO | Source: Ambulatory Visit | Attending: Internal Medicine | Admitting: Internal Medicine

## 2017-09-24 DIAGNOSIS — Z1231 Encounter for screening mammogram for malignant neoplasm of breast: Secondary | ICD-10-CM | POA: Diagnosis not present

## 2017-09-30 DIAGNOSIS — N183 Chronic kidney disease, stage 3 (moderate): Secondary | ICD-10-CM | POA: Diagnosis not present

## 2017-09-30 DIAGNOSIS — E7800 Pure hypercholesterolemia, unspecified: Secondary | ICD-10-CM | POA: Diagnosis not present

## 2017-09-30 DIAGNOSIS — I129 Hypertensive chronic kidney disease with stage 1 through stage 4 chronic kidney disease, or unspecified chronic kidney disease: Secondary | ICD-10-CM | POA: Diagnosis not present

## 2017-09-30 DIAGNOSIS — R739 Hyperglycemia, unspecified: Secondary | ICD-10-CM | POA: Diagnosis not present

## 2017-10-01 DIAGNOSIS — H353130 Nonexudative age-related macular degeneration, bilateral, stage unspecified: Secondary | ICD-10-CM | POA: Diagnosis not present

## 2017-10-01 DIAGNOSIS — H2512 Age-related nuclear cataract, left eye: Secondary | ICD-10-CM | POA: Diagnosis not present

## 2017-10-05 DIAGNOSIS — G4733 Obstructive sleep apnea (adult) (pediatric): Secondary | ICD-10-CM | POA: Diagnosis not present

## 2017-10-07 DIAGNOSIS — F325 Major depressive disorder, single episode, in full remission: Secondary | ICD-10-CM | POA: Diagnosis not present

## 2017-10-07 DIAGNOSIS — Z0000 Encounter for general adult medical examination without abnormal findings: Secondary | ICD-10-CM | POA: Diagnosis not present

## 2017-10-07 DIAGNOSIS — R739 Hyperglycemia, unspecified: Secondary | ICD-10-CM | POA: Diagnosis not present

## 2017-10-07 DIAGNOSIS — G2581 Restless legs syndrome: Secondary | ICD-10-CM | POA: Insufficient documentation

## 2017-10-07 DIAGNOSIS — G4733 Obstructive sleep apnea (adult) (pediatric): Secondary | ICD-10-CM | POA: Diagnosis not present

## 2017-10-07 DIAGNOSIS — C9190 Lymphoid leukemia, unspecified not having achieved remission: Secondary | ICD-10-CM | POA: Diagnosis not present

## 2017-10-07 DIAGNOSIS — E7800 Pure hypercholesterolemia, unspecified: Secondary | ICD-10-CM | POA: Diagnosis not present

## 2017-10-07 DIAGNOSIS — I129 Hypertensive chronic kidney disease with stage 1 through stage 4 chronic kidney disease, or unspecified chronic kidney disease: Secondary | ICD-10-CM | POA: Diagnosis not present

## 2017-10-07 DIAGNOSIS — I2692 Saddle embolus of pulmonary artery without acute cor pulmonale: Secondary | ICD-10-CM | POA: Diagnosis not present

## 2017-12-03 DIAGNOSIS — Z9989 Dependence on other enabling machines and devices: Secondary | ICD-10-CM | POA: Diagnosis not present

## 2017-12-03 DIAGNOSIS — R0609 Other forms of dyspnea: Secondary | ICD-10-CM | POA: Diagnosis not present

## 2017-12-03 DIAGNOSIS — G4733 Obstructive sleep apnea (adult) (pediatric): Secondary | ICD-10-CM | POA: Diagnosis not present

## 2017-12-03 DIAGNOSIS — Z86718 Personal history of other venous thrombosis and embolism: Secondary | ICD-10-CM | POA: Diagnosis not present

## 2017-12-10 DIAGNOSIS — R399 Unspecified symptoms and signs involving the genitourinary system: Secondary | ICD-10-CM | POA: Diagnosis not present

## 2017-12-10 DIAGNOSIS — M545 Low back pain: Secondary | ICD-10-CM | POA: Diagnosis not present

## 2018-01-15 DIAGNOSIS — M545 Low back pain: Secondary | ICD-10-CM | POA: Diagnosis not present

## 2018-01-15 DIAGNOSIS — G8929 Other chronic pain: Secondary | ICD-10-CM | POA: Diagnosis not present

## 2018-01-15 DIAGNOSIS — Z96651 Presence of right artificial knee joint: Secondary | ICD-10-CM | POA: Diagnosis not present

## 2018-01-15 DIAGNOSIS — Z6841 Body Mass Index (BMI) 40.0 and over, adult: Secondary | ICD-10-CM | POA: Diagnosis not present

## 2018-01-27 ENCOUNTER — Inpatient Hospital Stay: Payer: Medicare HMO | Attending: Internal Medicine | Admitting: Internal Medicine

## 2018-01-27 ENCOUNTER — Inpatient Hospital Stay: Payer: Medicare HMO

## 2018-01-27 ENCOUNTER — Other Ambulatory Visit: Payer: Self-pay

## 2018-01-27 VITALS — BP 118/66 | HR 73 | Temp 96.0°F | Resp 22 | Ht 65.0 in | Wt 274.0 lb

## 2018-01-27 DIAGNOSIS — I2699 Other pulmonary embolism without acute cor pulmonale: Secondary | ICD-10-CM | POA: Diagnosis not present

## 2018-01-27 DIAGNOSIS — D472 Monoclonal gammopathy: Secondary | ICD-10-CM

## 2018-01-27 DIAGNOSIS — G4733 Obstructive sleep apnea (adult) (pediatric): Secondary | ICD-10-CM | POA: Diagnosis not present

## 2018-01-27 DIAGNOSIS — C9110 Chronic lymphocytic leukemia of B-cell type not having achieved remission: Secondary | ICD-10-CM | POA: Insufficient documentation

## 2018-01-27 DIAGNOSIS — Z7901 Long term (current) use of anticoagulants: Secondary | ICD-10-CM | POA: Insufficient documentation

## 2018-01-27 LAB — CBC WITH DIFFERENTIAL/PLATELET
ABS IMMATURE GRANULOCYTES: 0.05 10*3/uL (ref 0.00–0.07)
BASOS ABS: 0.1 10*3/uL (ref 0.0–0.1)
Basophils Relative: 0 %
EOS ABS: 0.3 10*3/uL (ref 0.0–0.5)
Eosinophils Relative: 2 %
HEMATOCRIT: 40.8 % (ref 36.0–46.0)
HEMOGLOBIN: 12.9 g/dL (ref 12.0–15.0)
IMMATURE GRANULOCYTES: 0 %
LYMPHS ABS: 14.7 10*3/uL — AB (ref 0.7–4.0)
LYMPHS PCT: 69 %
MCH: 29.7 pg (ref 26.0–34.0)
MCHC: 31.6 g/dL (ref 30.0–36.0)
MCV: 94 fL (ref 80.0–100.0)
MONOS PCT: 4 %
Monocytes Absolute: 0.9 10*3/uL (ref 0.1–1.0)
NEUTROS PCT: 25 %
NRBC: 0 % (ref 0.0–0.2)
Neutro Abs: 5.3 10*3/uL (ref 1.7–7.7)
Platelets: 253 10*3/uL (ref 150–400)
RBC: 4.34 MIL/uL (ref 3.87–5.11)
RDW: 15.5 % (ref 11.5–15.5)
WBC: 21.4 10*3/uL — ABNORMAL HIGH (ref 4.0–10.5)

## 2018-01-27 LAB — COMPREHENSIVE METABOLIC PANEL
ALK PHOS: 99 U/L (ref 38–126)
ALT: 17 U/L (ref 0–44)
ANION GAP: 9 (ref 5–15)
AST: 16 U/L (ref 15–41)
Albumin: 3.7 g/dL (ref 3.5–5.0)
BILIRUBIN TOTAL: 0.9 mg/dL (ref 0.3–1.2)
BUN: 22 mg/dL (ref 8–23)
CALCIUM: 8.8 mg/dL — AB (ref 8.9–10.3)
CHLORIDE: 100 mmol/L (ref 98–111)
CO2: 29 mmol/L (ref 22–32)
Creatinine, Ser: 1.06 mg/dL — ABNORMAL HIGH (ref 0.44–1.00)
GFR calc non Af Amer: 50 mL/min — ABNORMAL LOW (ref >60)
GFR, EST AFRICAN AMERICAN: 58 mL/min — AB (ref >60)
Glucose, Bld: 128 mg/dL — ABNORMAL HIGH (ref 70–99)
Potassium: 3.9 mmol/L (ref 3.5–5.1)
Sodium: 138 mmol/L (ref 135–145)
Total Protein: 6.6 g/dL (ref 6.5–8.1)

## 2018-01-27 NOTE — Progress Notes (Signed)
[place] OFFICE PROGRESS NOTE  Patient Care Team: Kirk Ruths, MD as PCP - General (Internal Medicine) Kirk Ruths, MD (Internal Medicine) Bary Castilla Forest Gleason, MD (General Surgery); Dr.harold Jefm Bryant;    SUMMARY OF ONCOLOGIC HISTORY:  Oncology History   # April 2017- Bil Massive Pulmonary embolus/R LE DVT- s/p Thrombolysis [Dr.Schnier, [facility]]-Eliquis; NEG factor V leide/ Prothrombin gene. S/p IV Filter explantation.   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  2) CD56 expression on monocytes.  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  --------------------------------------------------------  DIAGNOSIS: CLL  STAGE: I        ;GOALS: COntrol  CURRENT/MOST RECENT THERAPY: surveillaince      CLL (chronic lymphocytic leukemia) (HCC)     INTERVAL HISTORY:  A pleasant 77 year old female patient with above history of History of CLL- on surveillance; and history of bilateral PE- on Eliquis is here for follow-up.  Patient denies any recent hospitalizations.  She does admit to easy bruising being on Eliquis.  Otherwise denies any blood in stools black or stools.  She states her knee pain is improved.  Complains of chronic worsening back pain. Denies any weight loss or night sweats or fevers. Denies new lumps or bumps.  Patient does complain of fatigue.  She wears a CPAP only few nights.  Review of Systems  Constitutional: Positive for malaise/fatigue. Negative for chills, diaphoresis, fever and weight loss.  HENT: Negative for nosebleeds and sore throat.   Eyes:  Negative for double vision.  Respiratory: Negative for cough, hemoptysis, sputum production, shortness of breath and wheezing.   Cardiovascular: Negative for chest pain, palpitations, orthopnea and leg swelling.  Gastrointestinal: Negative for abdominal pain, blood in stool, constipation, diarrhea, heartburn, melena, nausea and vomiting.  Genitourinary: Negative for dysuria, frequency and urgency.  Musculoskeletal: Positive for back pain. Negative for joint pain.  Skin: Negative.  Negative for itching and rash.  Neurological: Negative for dizziness, tingling, focal weakness, weakness and headaches.  Endo/Heme/Allergies: Does not bruise/bleed easily.  Psychiatric/Behavioral: Negative for depression. The patient is not nervous/anxious and does not have insomnia.     PAST MEDICAL HISTORY :  Past Medical History:  Diagnosis Date  . Anginal pain ([place])    atypical chest pain  . Anxiety   . Arthritis   . Back pain   . Cancer ([place])    cll  . Chronic kidney disease   . CLL (chronic lymphocytic leukemia) ([place])   . Depression   . GERD (gastroesophageal reflux disease)   . Headache   . Hemorrhoids   . Hypercholesteremia   . Hyperlipidemia   . Hypertension   . 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 Macular degeneration   . MGUS (monoclonal gammopathy of unknown significance)   . Obesity   . Pulmonary embolism ([place])   . PVD (peripheral vascular disease) ([place])   . Sleep apnea     PAST SURGICAL HISTORY :   Past Surgical History:  Procedure Laterality Date  . BREAST BIOPSY Left 08/19/04   lt bx/clip-neg  . COLONOSCOPY    . COLONOSCOPY WITH PROPOFOL N/A 03/10/2017   Procedure: COLONOSCOPY WITH PROPOFOL;  Surgeon:  [name], MD;  Location[facility] ENDOSCOPY;  Service: Endoscopy;  Laterality: N/A;  . IVC FILTER INSERTION N/A 06/11/2016   Procedure: IVC Filter Insertion;  Surgeon: Katha Cabal, MD;  Location: [place] [facility] LAB;  Service: Cardiovascular;   Laterality: N/A;  . IVC FILTER REMOVAL N/A 05/06/2017   Procedure: IVC FILTER REMOVAL;  Surgeon: Katha Cabal, MD;  Location: [place] [facility] LAB;  Service: Cardiovascular;  Laterality: N/A;  . JOINT REPLACEMENT Left 06/17/2016  . neck fusion    . PERIPHERAL VASCULAR CATHETERIZATION N/A 08/15/2015   Procedure: pulmonary angiogram with lysis;  Surgeon: Katha Cabal, MD;  Location: [place] [facility] LAB;  Service: Cardiovascular;  Laterality: N/A;  . WRIST SURGERY Left 2000    FAMILY HISTORY :   Family History  Problem Relation Age of Onset  . Heart failure Father   . Congestive Heart Failure Father   . Pulmonary embolism Mother   . Breast cancer Neg Hx     SOCIAL HISTORY:   Social History   Tobacco Use  . Smoking status: Never Smoker  . Smokeless tobacco: Never Used  Substance Use Topics  . Alcohol use: No    Alcohol/week: 0.0 standard drinks  . Drug use: No    ALLERGIES:  is allergic to oxycodone and morphine.  MEDICATIONS:  Current Outpatient Medications  Medication Sig Dispense Refill  . acetaminophen (TYLENOL) 650 MG CR tablet Take 650-1,300 mg by mouth every 8 (eight) hours as needed for pain. TYLENOL ARTHRITIS    . albuterol (PROVENTIL HFA;VENTOLIN HFA) 108 (90 Base) MCG/ACT inhaler Inhale 2 puffs into the lungs every 6 (six) hours as needed for wheezing or shortness of breath.    Arne Cleveland 5 MG TABS tablet TAKE 1 TABLET TWICE DAILY 180 tablet 6  . escitalopram (LEXAPRO) 10 MG tablet Take 10 mg by mouth daily.    Marland Kitchen gabapentin (NEURONTIN) 300 MG capsule Take 300 mg by mouth 3 (three) times daily.     Marland Kitchen losartan-hydrochlorothiazide (HYZAAR) 100-12.5 MG tablet Take 1 tablet by mouth daily. Reported on 08/22/2015    . Multiple Vitamins-Minerals (PRESERVISION AREDS 2 PO) Take 1 capsule by mouth 2 (two) times daily.     . pantoprazole (PROTONIX) 40 MG tablet Take 40 mg by mouth daily.    . pravastatin (PRAVACHOL) 40 MG tablet Take 40 mg by mouth daily.     .  propranolol (INDERAL) 20 MG tablet Take 20 mg by mouth 2 (two) times daily.     Current Facility-Administered Medications  Medication Dose Route Frequency Provider Last Rate Last Dose  . ceFAZolin (ANCEF) IVPB 1 g/50 mL premix  1 g Intravenous Once Schnier, Dolores Lory, MD        PHYSICAL EXAMINATION:   BP 118/66 (BP Location: Left Arm, Patient Position: Sitting)   Pulse 73   Temp (!) 96 F (35.6 C) (Tympanic)   Resp (!) 22   Ht 5\' 5"  (1.651 m)   Wt 274 lb (124.3 kg)   BMI 45.60 kg/m   Filed Weights   01/27/18 1107  Weight: 274 lb (124.3 kg)    Physical Exam  Constitutional: She is oriented to person, place, and time and well-developed, well-nourished, and in no distress.  Obese.  Walks by herself.  HENT:  Head: Normocephalic and atraumatic.  Mouth/Throat: Oropharynx is clear and moist. No oropharyngeal exudate.  Eyes: Pupils are equal, round, and reactive to light.  Neck: Normal range of motion. Neck supple.  Cardiovascular: Normal  rate and regular rhythm.  Pulmonary/Chest: No respiratory distress. She has no wheezes.  Abdominal: Soft. Bowel sounds are normal. She exhibits no distension and no mass. There is no tenderness. There is no rebound and no guarding.  Musculoskeletal: Normal range of motion. She exhibits no edema or tenderness.  Neurological: She is alert and oriented to person, place, and time.  Skin: Skin is warm.  Chronic bruises.  Psychiatric: Affect normal.     LABORATORY DATA:  I have reviewed the data as listed    Component Value Date/Time   NA 138 01/27/2018 1047   NA 141 04/29/2012 1822   K 3.9 01/27/2018 1047   K 3.9 04/29/2012 1822   CL 100 01/27/2018 1047   CL 107 04/29/2012 1822   CO2 29 01/27/2018 1047   CO2 28 04/29/2012 1822   GLUCOSE 128 (H) 01/27/2018 1047   GLUCOSE 129 (H) 04/29/2012 1822   BUN 22 01/27/2018 1047   BUN 21 (H) 04/29/2012 1822   CREATININE 1.06 (H) 01/27/2018 1047   CREATININE 1.02 (H) 07/28/2014 0950   CALCIUM  8.8 (L) 01/27/2018 1047   CALCIUM 8.7 04/29/2012 1822   PROT 6.6 01/27/2018 1047   PROT 7.1 04/29/2012 1822   ALBUMIN 3.7 01/27/2018 1047   ALBUMIN 3.3 (L) 04/29/2012 1822   AST 16 01/27/2018 1047   AST 20 04/29/2012 1822   ALT 17 01/27/2018 1047   ALT 33 04/29/2012 1822   ALKPHOS 99 01/27/2018 1047   ALKPHOS 169 (H) 04/29/2012 1822   BILITOT 0.9 01/27/2018 1047   BILITOT 0.3 04/29/2012 1822   GFRNONAA 50 (L) 01/27/2018 1047   GFRNONAA 55 (L) 07/28/2014 0950   GFRAA 58 (L) 01/27/2018 1047   GFRAA >60 07/28/2014 0950    No results found for: SPEP, UPEP  Lab Results  Component Value Date   WBC 21.4 (H) 01/27/2018   NEUTROABS 5.3 01/27/2018   HGB 12.9 01/27/2018   HCT 40.8 01/27/2018   MCV 94.0 01/27/2018   PLT 253 01/27/2018      Chemistry      Component Value Date/Time   NA 138 01/27/2018 1047   NA 141 04/29/2012 1822   K 3.9 01/27/2018 1047   K 3.9 04/29/2012 1822   CL 100 01/27/2018 1047   CL 107 04/29/2012 1822   CO2 29 01/27/2018 1047   CO2 28 04/29/2012 1822   BUN 22 01/27/2018 1047   BUN 21 (H) 04/29/2012 1822   CREATININE 1.06 (H) 01/27/2018 1047   CREATININE 1.02 (H) 07/28/2014 0950      Component Value Date/Time   CALCIUM 8.8 (L) 01/27/2018 1047   CALCIUM 8.7 04/29/2012 1822   ALKPHOS 99 01/27/2018 1047   ALKPHOS 169 (H) 04/29/2012 1822   AST 16 01/27/2018 1047   AST 20 04/29/2012 1822   ALT 17 01/27/2018 1047   ALT 33 04/29/2012 1822   BILITOT 0.9 01/27/2018 1047   BILITOT 0.3 04/29/2012 1822        ASSESSMENT & PLAN:   CLL (chronic lymphocytic leukemia) (HCC) # Massive bilateral Pulmonary Embolus-on indefinite anticoagulation.  Continue Eliquis.  # Chronic low-grade B-cell lymphoproliferative disorder/CLL-hemoglobin platelets normal.  White count is slightly up at 21.  Patient is fairly asymptomatic except for fatigue [see discussion below].  Discussed while fatigue could be a symptom from her underlying lymphoproliferative disorder;  unlikely.  However again fatigue is a diagnosis of exclusion.  Hard to justify treating the patient for her underlying lymphoproliferative disorder does present fatigue.  #  MGUS-0.04 g/dL IgA kappa April 2019.  Again repeat in 6 months.  # fatigue-history of obstructive sleep apnea.  Recommend using CPAP every night; if not improved then recommend reevaluation of fatigue.  # follow up in 6 months-MD labs-[ cbc/cmp/ldh/MM; K-light chains]    Jamie Sickle, MD 01/27/2018 7:22 PM

## 2018-01-27 NOTE — Assessment & Plan Note (Addendum)
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  Continue Eliquis.  # Chronic low-grade B-cell lymphoproliferative disorder/CLL-hemoglobin platelets normal.  White count is slightly up at 21.  Patient is fairly asymptomatic except for fatigue [see discussion below].  Discussed while fatigue could be a symptom from her underlying lymphoproliferative disorder; unlikely.  However again fatigue is a diagnosis of exclusion.  Hard to justify treating the patient for her underlying lymphoproliferative disorder does present fatigue.  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  Again repeat in 6 months.  # fatigue-history of obstructive sleep apnea.  Recommend using CPAP every night; if not improved then recommend reevaluation of fatigue.  # follow up in 6 months-MD labs-[ cbc/cmp/ldh/MM; K-light chains]

## 2018-03-03 DIAGNOSIS — C9110 Chronic lymphocytic leukemia of B-cell type not having achieved remission: Secondary | ICD-10-CM | POA: Diagnosis not present

## 2018-03-03 DIAGNOSIS — G4733 Obstructive sleep apnea (adult) (pediatric): Secondary | ICD-10-CM | POA: Diagnosis not present

## 2018-03-03 DIAGNOSIS — F325 Major depressive disorder, single episode, in full remission: Secondary | ICD-10-CM | POA: Diagnosis not present

## 2018-03-03 DIAGNOSIS — E7800 Pure hypercholesterolemia, unspecified: Secondary | ICD-10-CM | POA: Diagnosis not present

## 2018-03-03 DIAGNOSIS — R739 Hyperglycemia, unspecified: Secondary | ICD-10-CM | POA: Diagnosis not present

## 2018-03-03 DIAGNOSIS — I2692 Saddle embolus of pulmonary artery without acute cor pulmonale: Secondary | ICD-10-CM | POA: Diagnosis not present

## 2018-03-03 DIAGNOSIS — I129 Hypertensive chronic kidney disease with stage 1 through stage 4 chronic kidney disease, or unspecified chronic kidney disease: Secondary | ICD-10-CM | POA: Diagnosis not present

## 2018-03-03 DIAGNOSIS — Z23 Encounter for immunization: Secondary | ICD-10-CM | POA: Diagnosis not present

## 2018-03-23 DIAGNOSIS — I2699 Other pulmonary embolism without acute cor pulmonale: Secondary | ICD-10-CM | POA: Diagnosis not present

## 2018-03-23 DIAGNOSIS — I509 Heart failure, unspecified: Secondary | ICD-10-CM | POA: Diagnosis not present

## 2018-03-23 DIAGNOSIS — E782 Mixed hyperlipidemia: Secondary | ICD-10-CM | POA: Diagnosis not present

## 2018-03-23 DIAGNOSIS — N183 Chronic kidney disease, stage 3 (moderate): Secondary | ICD-10-CM | POA: Diagnosis not present

## 2018-03-23 DIAGNOSIS — I5040 Unspecified combined systolic (congestive) and diastolic (congestive) heart failure: Secondary | ICD-10-CM | POA: Diagnosis not present

## 2018-03-23 DIAGNOSIS — I2510 Atherosclerotic heart disease of native coronary artery without angina pectoris: Secondary | ICD-10-CM | POA: Diagnosis not present

## 2018-03-23 DIAGNOSIS — I10 Essential (primary) hypertension: Secondary | ICD-10-CM | POA: Diagnosis not present

## 2018-03-23 DIAGNOSIS — R0609 Other forms of dyspnea: Secondary | ICD-10-CM | POA: Diagnosis not present

## 2018-03-23 DIAGNOSIS — K219 Gastro-esophageal reflux disease without esophagitis: Secondary | ICD-10-CM | POA: Diagnosis not present

## 2018-03-27 DIAGNOSIS — M5136 Other intervertebral disc degeneration, lumbar region: Secondary | ICD-10-CM | POA: Diagnosis not present

## 2018-03-27 DIAGNOSIS — G8929 Other chronic pain: Secondary | ICD-10-CM | POA: Diagnosis not present

## 2018-03-27 DIAGNOSIS — M5137 Other intervertebral disc degeneration, lumbosacral region: Secondary | ICD-10-CM | POA: Insufficient documentation

## 2018-03-27 DIAGNOSIS — M545 Low back pain: Secondary | ICD-10-CM | POA: Diagnosis not present

## 2018-03-27 DIAGNOSIS — G4733 Obstructive sleep apnea (adult) (pediatric): Secondary | ICD-10-CM | POA: Diagnosis not present

## 2018-03-27 DIAGNOSIS — Z6841 Body Mass Index (BMI) 40.0 and over, adult: Secondary | ICD-10-CM | POA: Diagnosis not present

## 2018-03-30 ENCOUNTER — Other Ambulatory Visit: Payer: Self-pay | Admitting: Orthopedic Surgery

## 2018-03-30 DIAGNOSIS — M545 Low back pain, unspecified: Secondary | ICD-10-CM

## 2018-03-30 DIAGNOSIS — G8929 Other chronic pain: Secondary | ICD-10-CM

## 2018-03-30 DIAGNOSIS — M5136 Other intervertebral disc degeneration, lumbar region: Secondary | ICD-10-CM

## 2018-03-31 DIAGNOSIS — R739 Hyperglycemia, unspecified: Secondary | ICD-10-CM | POA: Diagnosis not present

## 2018-03-31 DIAGNOSIS — E7800 Pure hypercholesterolemia, unspecified: Secondary | ICD-10-CM | POA: Diagnosis not present

## 2018-03-31 DIAGNOSIS — N183 Chronic kidney disease, stage 3 (moderate): Secondary | ICD-10-CM | POA: Diagnosis not present

## 2018-03-31 DIAGNOSIS — I129 Hypertensive chronic kidney disease with stage 1 through stage 4 chronic kidney disease, or unspecified chronic kidney disease: Secondary | ICD-10-CM | POA: Diagnosis not present

## 2018-04-08 DIAGNOSIS — G4733 Obstructive sleep apnea (adult) (pediatric): Secondary | ICD-10-CM | POA: Diagnosis not present

## 2018-04-08 DIAGNOSIS — E7800 Pure hypercholesterolemia, unspecified: Secondary | ICD-10-CM | POA: Diagnosis not present

## 2018-04-08 DIAGNOSIS — R739 Hyperglycemia, unspecified: Secondary | ICD-10-CM | POA: Diagnosis not present

## 2018-04-08 DIAGNOSIS — M5136 Other intervertebral disc degeneration, lumbar region: Secondary | ICD-10-CM | POA: Diagnosis not present

## 2018-04-08 DIAGNOSIS — I129 Hypertensive chronic kidney disease with stage 1 through stage 4 chronic kidney disease, or unspecified chronic kidney disease: Secondary | ICD-10-CM | POA: Diagnosis not present

## 2018-04-08 DIAGNOSIS — F325 Major depressive disorder, single episode, in full remission: Secondary | ICD-10-CM | POA: Diagnosis not present

## 2018-04-08 DIAGNOSIS — C9110 Chronic lymphocytic leukemia of B-cell type not having achieved remission: Secondary | ICD-10-CM | POA: Diagnosis not present

## 2018-04-08 DIAGNOSIS — I2692 Saddle embolus of pulmonary artery without acute cor pulmonale: Secondary | ICD-10-CM | POA: Diagnosis not present

## 2018-04-10 ENCOUNTER — Ambulatory Visit
Admission: RE | Admit: 2018-04-10 | Discharge: 2018-04-10 | Disposition: A | Discharge status: Home / Self Care | Payer: Medicare HMO | Source: Ambulatory Visit | Attending: Orthopedic Surgery | Admitting: Orthopedic Surgery

## 2018-04-10 DIAGNOSIS — M48061 Spinal stenosis, lumbar region without neurogenic claudication: Secondary | ICD-10-CM | POA: Diagnosis not present

## 2018-04-10 DIAGNOSIS — M4316 Spondylolisthesis, lumbar region: Secondary | ICD-10-CM | POA: Diagnosis not present

## 2018-04-10 DIAGNOSIS — M545 Low back pain, unspecified: Secondary | ICD-10-CM

## 2018-04-10 DIAGNOSIS — G8929 Other chronic pain: Secondary | ICD-10-CM | POA: Diagnosis not present

## 2018-04-10 DIAGNOSIS — M5136 Other intervertebral disc degeneration, lumbar region: Secondary | ICD-10-CM

## 2018-05-11 DIAGNOSIS — M48062 Spinal stenosis, lumbar region with neurogenic claudication: Secondary | ICD-10-CM | POA: Diagnosis not present

## 2018-05-11 DIAGNOSIS — M5416 Radiculopathy, lumbar region: Secondary | ICD-10-CM | POA: Diagnosis not present

## 2018-05-11 DIAGNOSIS — M5136 Other intervertebral disc degeneration, lumbar region: Secondary | ICD-10-CM | POA: Diagnosis not present

## 2018-06-09 DIAGNOSIS — M48062 Spinal stenosis, lumbar region with neurogenic claudication: Secondary | ICD-10-CM | POA: Diagnosis not present

## 2018-06-09 DIAGNOSIS — M5416 Radiculopathy, lumbar region: Secondary | ICD-10-CM | POA: Diagnosis not present

## 2018-06-09 DIAGNOSIS — M5136 Other intervertebral disc degeneration, lumbar region: Secondary | ICD-10-CM | POA: Diagnosis not present

## 2018-07-07 DIAGNOSIS — M545 Low back pain: Secondary | ICD-10-CM | POA: Diagnosis not present

## 2018-07-07 DIAGNOSIS — M5136 Other intervertebral disc degeneration, lumbar region: Secondary | ICD-10-CM | POA: Diagnosis not present

## 2018-07-07 DIAGNOSIS — Z6841 Body Mass Index (BMI) 40.0 and over, adult: Secondary | ICD-10-CM | POA: Diagnosis not present

## 2018-07-07 DIAGNOSIS — M48061 Spinal stenosis, lumbar region without neurogenic claudication: Secondary | ICD-10-CM | POA: Diagnosis not present

## 2018-07-07 DIAGNOSIS — G8929 Other chronic pain: Secondary | ICD-10-CM | POA: Diagnosis not present

## 2018-08-04 ENCOUNTER — Other Ambulatory Visit: Payer: Medicare HMO

## 2018-08-04 ENCOUNTER — Ambulatory Visit: Payer: Medicare HMO | Admitting: Hematology and Oncology

## 2018-08-24 ENCOUNTER — Other Ambulatory Visit: Payer: Self-pay | Admitting: Internal Medicine

## 2018-08-24 DIAGNOSIS — Z1231 Encounter for screening mammogram for malignant neoplasm of breast: Secondary | ICD-10-CM

## 2018-08-30 ENCOUNTER — Other Ambulatory Visit: Payer: Self-pay

## 2018-08-31 ENCOUNTER — Inpatient Hospital Stay: Payer: Medicare HMO | Attending: Hematology and Oncology

## 2018-08-31 DIAGNOSIS — R5383 Other fatigue: Secondary | ICD-10-CM | POA: Diagnosis not present

## 2018-08-31 DIAGNOSIS — I509 Heart failure, unspecified: Secondary | ICD-10-CM | POA: Insufficient documentation

## 2018-08-31 DIAGNOSIS — I739 Peripheral vascular disease, unspecified: Secondary | ICD-10-CM | POA: Diagnosis not present

## 2018-08-31 DIAGNOSIS — C9110 Chronic lymphocytic leukemia of B-cell type not having achieved remission: Secondary | ICD-10-CM | POA: Diagnosis not present

## 2018-08-31 DIAGNOSIS — E7800 Pure hypercholesterolemia, unspecified: Secondary | ICD-10-CM | POA: Insufficient documentation

## 2018-08-31 DIAGNOSIS — G4730 Sleep apnea, unspecified: Secondary | ICD-10-CM | POA: Diagnosis not present

## 2018-08-31 DIAGNOSIS — F329 Major depressive disorder, single episode, unspecified: Secondary | ICD-10-CM | POA: Insufficient documentation

## 2018-08-31 DIAGNOSIS — I10 Essential (primary) hypertension: Secondary | ICD-10-CM | POA: Diagnosis not present

## 2018-08-31 DIAGNOSIS — N189 Chronic kidney disease, unspecified: Secondary | ICD-10-CM | POA: Insufficient documentation

## 2018-08-31 DIAGNOSIS — E669 Obesity, unspecified: Secondary | ICD-10-CM | POA: Diagnosis not present

## 2018-08-31 DIAGNOSIS — Z86711 Personal history of pulmonary embolism: Secondary | ICD-10-CM | POA: Insufficient documentation

## 2018-08-31 DIAGNOSIS — F419 Anxiety disorder, unspecified: Secondary | ICD-10-CM | POA: Insufficient documentation

## 2018-08-31 DIAGNOSIS — M549 Dorsalgia, unspecified: Secondary | ICD-10-CM | POA: Insufficient documentation

## 2018-08-31 DIAGNOSIS — Z86718 Personal history of other venous thrombosis and embolism: Secondary | ICD-10-CM | POA: Insufficient documentation

## 2018-08-31 DIAGNOSIS — E785 Hyperlipidemia, unspecified: Secondary | ICD-10-CM | POA: Insufficient documentation

## 2018-08-31 DIAGNOSIS — D472 Monoclonal gammopathy: Secondary | ICD-10-CM | POA: Insufficient documentation

## 2018-08-31 DIAGNOSIS — K219 Gastro-esophageal reflux disease without esophagitis: Secondary | ICD-10-CM | POA: Diagnosis not present

## 2018-08-31 DIAGNOSIS — Z79899 Other long term (current) drug therapy: Secondary | ICD-10-CM | POA: Insufficient documentation

## 2018-08-31 DIAGNOSIS — M25569 Pain in unspecified knee: Secondary | ICD-10-CM | POA: Insufficient documentation

## 2018-08-31 DIAGNOSIS — M129 Arthropathy, unspecified: Secondary | ICD-10-CM | POA: Insufficient documentation

## 2018-08-31 DIAGNOSIS — Z7901 Long term (current) use of anticoagulants: Secondary | ICD-10-CM | POA: Diagnosis not present

## 2018-08-31 DIAGNOSIS — R400 Somnolence: Secondary | ICD-10-CM | POA: Diagnosis not present

## 2018-08-31 LAB — COMPREHENSIVE METABOLIC PANEL
ALT: 14 U/L (ref 0–44)
AST: 17 U/L (ref 15–41)
Albumin: 3.9 g/dL (ref 3.5–5.0)
Alkaline Phosphatase: 93 U/L (ref 38–126)
Anion gap: 8 (ref 5–15)
BUN: 23 mg/dL (ref 8–23)
CO2: 29 mmol/L (ref 22–32)
Calcium: 8.8 mg/dL — ABNORMAL LOW (ref 8.9–10.3)
Chloride: 103 mmol/L (ref 98–111)
Creatinine, Ser: 1.12 mg/dL — ABNORMAL HIGH (ref 0.44–1.00)
GFR calc Af Amer: 55 mL/min — ABNORMAL LOW (ref >60)
GFR calc non Af Amer: 48 mL/min — ABNORMAL LOW (ref >60)
Glucose, Bld: 120 mg/dL — ABNORMAL HIGH (ref 70–99)
Potassium: 3.7 mmol/L (ref 3.5–5.1)
Sodium: 140 mmol/L (ref 135–145)
Total Bilirubin: 0.6 mg/dL (ref 0.3–1.2)
Total Protein: 6.6 g/dL (ref 6.5–8.1)

## 2018-08-31 LAB — CBC WITH DIFFERENTIAL/PLATELET
Abs Immature Granulocytes: 0.04 10*3/uL (ref 0.00–0.07)
Basophils Absolute: 0.1 10*3/uL (ref 0.0–0.1)
Basophils Relative: 1 %
Eosinophils Absolute: 0.4 10*3/uL (ref 0.0–0.5)
Eosinophils Relative: 3 %
HCT: 38.6 % (ref 36.0–46.0)
Hemoglobin: 12.5 g/dL (ref 12.0–15.0)
Immature Granulocytes: 0 %
Lymphocytes Relative: 68 %
Lymphs Abs: 11.8 10*3/uL — ABNORMAL HIGH (ref 0.7–4.0)
MCH: 30.9 pg (ref 26.0–34.0)
MCHC: 32.4 g/dL (ref 30.0–36.0)
MCV: 95.5 fL (ref 80.0–100.0)
Monocytes Absolute: 0.8 10*3/uL (ref 0.1–1.0)
Monocytes Relative: 5 %
Neutro Abs: 3.9 10*3/uL (ref 1.7–7.7)
Neutrophils Relative %: 23 %
Platelets: 233 10*3/uL (ref 150–400)
RBC: 4.04 MIL/uL (ref 3.87–5.11)
RDW: 15.4 % (ref 11.5–15.5)
WBC: 17 10*3/uL — ABNORMAL HIGH (ref 4.0–10.5)
nRBC: 0 % (ref 0.0–0.2)

## 2018-08-31 LAB — LACTATE DEHYDROGENASE: LDH: 176 U/L (ref 98–192)

## 2018-09-01 ENCOUNTER — Other Ambulatory Visit: Payer: Medicare HMO

## 2018-09-01 ENCOUNTER — Ambulatory Visit: Payer: Medicare HMO | Admitting: Hematology and Oncology

## 2018-09-01 LAB — KAPPA/LAMBDA LIGHT CHAINS
Kappa free light chain: 36.7 mg/L — ABNORMAL HIGH (ref 3.3–19.4)
Kappa, lambda light chain ratio: 5.24 — ABNORMAL HIGH (ref 0.26–1.65)
Lambda free light chains: 7 mg/L (ref 5.7–26.3)

## 2018-09-01 LAB — MULTIPLE MYELOMA PANEL, SERUM
Albumin SerPl Elph-Mcnc: 3.4 g/dL (ref 2.9–4.4)
Albumin/Glob SerPl: 1.4 (ref 0.7–1.7)
Alpha 1: 0.2 g/dL (ref 0.0–0.4)
Alpha2 Glob SerPl Elph-Mcnc: 0.8 g/dL (ref 0.4–1.0)
B-Globulin SerPl Elph-Mcnc: 1.1 g/dL (ref 0.7–1.3)
Gamma Glob SerPl Elph-Mcnc: 0.4 g/dL (ref 0.4–1.8)
Globulin, Total: 2.5 g/dL (ref 2.2–3.9)
IgA: 272 mg/dL (ref 64–422)
IgG (Immunoglobin G), Serum: 488 mg/dL — ABNORMAL LOW (ref 586–1602)
IgM (Immunoglobulin M), Srm: 50 mg/dL (ref 26–217)
Total Protein ELP: 5.9 g/dL — ABNORMAL LOW (ref 6.0–8.5)

## 2018-09-02 NOTE — Progress Notes (Signed)
[facility]  [address] [place], [place] [address] Phone: [phone]  Fax: [phone]   Clinic Day:  09/03/2018  Referring physician: Kirk Ruths, MD  Chief Complaint: Jamie Phillips is a 78 y.o. 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  HPI: She was diagnosed with CLL at least 15 years ago. She was asymptomatic.  An abnormality was noted on blood work ordered by Dr. Gorden Harms.  WBC has ranged between 15,700 - 31,600 without trend since 08/15/2015.  She has been followed without intervention.  She was diagnosed with a low grade lymphoproliferative disorder in 06/2004.  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 Peripheral blood flow cytometry in 06/2004 was positive for CD 19, CD 20, CD5 (dim), andCD11c; negative for CD10, CD23-CD25, CD38.  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  She was initially seen in the hematology clinic by Dr Ma Hillock.  She was seen by Dr Rogue Bussing on 08/01/2015 for a low grade lymphoproliferative disorder and MGUS. At that time, she complained of chronic fatigue and chronic knee pain. She has a history of chronic kidney disease and CHF. She was largely asymptomatic.  Surveillance continued.   She was admitted to [facility] from 08/14/2015 - 08/16/2015 with bilateral massive PE following respiratory distress. Chest CT angiogram on 08/14/2015 revealed anacute and subacute pulmonary emboli with evidence of right heart strain (RV/LV Ratio = 1.2) consistent with at least submassive (intermediate risk) PE.  Bilateral lower extremity duplex on 08/15/2015 revealed a right posterior tibial DVT.  She underwent pulmonary angiogram with lysis on 08/15/2015 by Dr. Delana Meyer. Work-up  was negative for Factor V Leiden and prothrombin gene mutation. She was put on Eliquis BID indefinitely.  She was scheduled for bilateral knee replacement.  She was felt at high risk for recurrent pulmonary embolism perioperatively.  An IVC filter was placed on 06/11/2016 and removed on 05/06/2017 by Dr. Delana Meyer.   Abdomen and pelvic CT on 10/14/2016 revealed no adenopathy or hepatosplenomegaly.  She has been followed every 6 months by Dr. Rogue Bussing.  Notes reviewed.  The patient was last seen in the hematology clinic on 01/27/2018.  At that time, she complained of knee pain, improving, and back pain. She bruised easily. She was wearing her CPAP intermittently. She denied any blood in her stool. Hematocrit was 40.8, hemoglobin 12.9, MCV 94, platelets 253,000, WBC 21,400 (ANC 5300; ALC 14,700).  M-spike has been followed (gm/dL):  0.1 on 07/16/2012, 0 on 07/28/2014, 0 on 01/26/2015, 0 on 08/01/2015, 0 on 11/14/2015, 0.3 on 07/30/2016, 0.4 on 07/29/2017, and 0 on 08/31/2018.  Immunofixation revealed an IgA monoclonal gammopathy with kappa light chain specificity.  Kappa free light chains have been followed:  32.73 (ratio 4.41) on 08/01/2015, 26.2 (ratio 4.44) on 07/30/2016, 36.7 (ratio 4.96) on 07/29/2017, and 36.7 (ratio 5.24) on 08/31/2018.  During the interim, she has been doing fairly well.  She is chronically fatigued. She has constant back pain. She takes gabapentin, which makes her drowsy.    She has sleep apnea and is unaware of the last time her CPAP machine was calibrated due to some issues with insurance coverage.  She thinks it was several years ago.    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)    Macular degeneration    MGUS (monoclonal gammopathy of unknown significance)    Obesity    Pulmonary embolism ([facility])    PVD (peripheral vascular disease) ([place])    Sleep apnea     Past Surgical History:  Procedure Laterality Date   BREAST BIOPSY Left 08/19/04   lt bx/clip-neg   COLONOSCOPY     COLONOSCOPY WITH PROPOFOL N/A 03/10/2017   Procedure: COLONOSCOPY WITH PROPOFOL;  Surgeon: [name], MD;  Location: [facility] ENDOSCOPY;  Service: Endoscopy;  Laterality: N/A;   IVC FILTER INSERTION N/A 06/11/2016   Procedure: IVC Filter Insertion;  Surgeon: Katha Cabal, MD;  Location: [place] [facility];  Service: Cardiovascular;  Laterality: N/A;   IVC FILTER REMOVAL N/A 05/06/2017   Procedure: IVC FILTER REMOVAL;  Surgeon: Katha Cabal, MD;  Location: [place] [facility];  Service: Cardiovascular;  Laterality: N/A;   JOINT REPLACEMENT Left 06/17/2016   neck fusion     PERIPHERAL VASCULAR CATHETERIZATION N/A 08/15/2015   Procedure: pulmonary angiogram with lysis;  Surgeon: Katha Cabal, MD;  Location: [place] [facility];  Service: Cardiovascular;  Laterality: N/A;   WRIST SURGERY Left 2000    Family History  Problem Relation Age of Onset   Heart failure Father    Congestive Heart Failure Father    Pulmonary embolism Mother    Breast cancer Neg Hx     Social History:  reports that she has never smoked. She has never used smokeless tobacco. She reports that she does not drink alcohol or use drugs.  She does not smoke or drink alcohol. She lives with her son in [place], [place]. 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  She denies any known exposure to radiation or toxins. She takes care of her daughter who has had 2 strokes. The patient is alone today.  Allergies:  Allergies    Allergen Reactions   Oxycodone Other (See Comments)    Reaction:  Hallucinations   Morphine Other (See Comments)    Reaction:  Hallucinations    Current Medications: Current Outpatient Medications  Medication Sig Dispense Refill   acetaminophen (TYLENOL) 650 MG CR tablet Take 650-1,300 mg by mouth every 8 (eight) hours as needed for pain. TYLENOL ARTHRITIS     amoxicillin (AMOXIL) 500 MG capsule Before Dental Appt.     ELIQUIS 5 MG TABS tablet TAKE 1 TABLET TWICE DAILY 180 tablet 6   escitalopram (LEXAPRO) 10 MG tablet Take 10 mg by mouth daily.     gabapentin (NEURONTIN) 300 MG capsule Take 300 mg by mouth 3 (three) times daily.      losartan-hydrochlorothiazide (HYZAAR) 100-12.5 MG tablet Take 1 tablet by mouth daily. Reported on 08/22/2015     Multiple Vitamins-Minerals (PRESERVISION AREDS 2 PO) Take 1 capsule by mouth 2 (two) times daily.      pantoprazole (PROTONIX) 40 MG tablet Take 40 mg by mouth daily.     pravastatin (PRAVACHOL) 40 MG tablet Take 40 mg by  mouth daily.      propranolol (INDERAL) 20 MG tablet Take 20 mg by mouth 2 (two) times daily.     tiZANidine (ZANAFLEX) 4 MG tablet Take 4 mg by mouth every 6 (six) hours as needed.      traMADol (ULTRAM) 50 MG tablet Take 50 mg by mouth every 6 (six) hours as needed.      albuterol (PROVENTIL HFA;VENTOLIN HFA) 108 (90 Base) MCG/ACT inhaler Inhale 2 puffs into the lungs every 6 (six) hours as needed for wheezing or shortness of breath.     Current Facility-Administered Medications  Medication Dose Route Frequency Provider Last Rate Last Dose   ceFAZolin (ANCEF) IVPB 1 g/50 mL premix  1 g Intravenous Once Schnier, Dolores Lory, MD       Review of Systems  Constitutional: Positive for malaise/fatigue (chronic). Negative for chills, diaphoresis, fever and weight loss.  HENT: Negative.  Negative for congestion, ear pain, hearing loss, nosebleeds, sinus pain and sore throat.   Eyes: Negative.  Negative for blurred  vision, double vision and pain.  Respiratory: Negative.  Negative for cough, hemoptysis, sputum production, shortness of breath and wheezing.        Sleep apnea.  Cardiovascular: Negative.  Negative for chest pain, palpitations, orthopnea, leg swelling and PND.  Gastrointestinal: Negative.  Negative for abdominal pain, blood in stool, constipation, diarrhea, heartburn, melena, nausea and vomiting.  Genitourinary: Negative.  Negative for dysuria, hematuria and urgency.  Musculoskeletal: Positive for back pain (chronic) and joint pain (knees). Negative for myalgias.  Skin: Negative.  Negative for itching and rash.  Neurological: Negative.  Negative for dizziness, tingling, sensory change, speech change, weakness and headaches.  Endo/Heme/Allergies: Bruises/bleeds easily (chronic bruises).  Psychiatric/Behavioral: Negative.  Negative for depression and memory loss. The patient is not nervous/anxious.   All other systems reviewed and are negative.  Performance status (ECOG): 1  Physical Exam  Constitutional: She is oriented to person, place, and time. She appears well-developed and well-nourished. No distress.  HENT:  Head: Normocephalic and atraumatic.  Mouth/Throat: Oropharynx is clear and moist. No oropharyngeal exudate.  Short curly white and gray hair. Wearing a face mask.  Eyes: Pupils are equal, round, and reactive to light. Conjunctivae and EOM are normal. No scleral icterus.  Glasses.  Blue eyes.  Neck: Normal range of motion. Neck supple. No JVD present.  Cardiovascular: Normal rate, regular rhythm and normal heart sounds.  No murmur heard. Pulmonary/Chest: Effort normal and breath sounds normal. No respiratory distress. She has no wheezes. She has no rales.  Abdominal: Soft. Bowel sounds are normal. She exhibits no distension and no mass. There is no abdominal tenderness. There is no rebound and no guarding.  Musculoskeletal: Normal range of motion.        General: No edema.      Lumbar back: She exhibits tenderness.  Lymphadenopathy:    She has no cervical adenopathy.    She has no axillary adenopathy.       Right: No supraclavicular adenopathy present.       Left: No supraclavicular adenopathy present.  Neurological: She is alert and oriented to person, place, and time.  Skin: Skin is warm and dry. No rash noted. She is not diaphoretic. No erythema. No pallor.  Psychiatric: She has a normal mood and affect. Her behavior is normal. Judgment and thought content normal.  Nursing note and vitals reviewed.   No visits with results within 3 Day(s) from this visit.  Latest known visit with results  is:  Appointment on 08/31/2018  Component Date Value Ref Range Status   Kappa free light chain 08/31/2018 36.7* 3.3 - 19.4 mg/L Final   Lamda free light chains 08/31/2018 7.0  5.7 - 26.3 mg/L Final   Kappa, lamda light chain ratio 08/31/2018 5.24* 0.26 - 1.65 Final   Comment: (NOTE) Performed At: [facility] [place], [place] [id number] Rush Farmer MD WU:[id number]    IgG (Immunoglobin G), Serum 08/31/2018 488* 586 - 1,602 mg/dL Final   IgA 08/31/2018 272  64 - 422 mg/dL Final   IgM (Immunoglobulin M), Srm 08/31/2018 50  26 - 217 mg/dL Final   Total Protein ELP 08/31/2018 5.9* 6.0 - 8.5 g/dL Corrected   Albumin SerPl Elph-Mcnc 08/31/2018 3.4  2.9 - 4.4 g/dL Corrected   Alpha 1 08/31/2018 0.2  0.0 - 0.4 g/dL Corrected   Alpha2 Glob SerPl Elph-Mcnc 08/31/2018 0.8  0.4 - 1.0 g/dL Corrected   B-Globulin SerPl Elph-Mcnc 08/31/2018 1.1  0.7 - 1.3 g/dL Corrected   Gamma Glob SerPl Elph-Mcnc 08/31/2018 0.4  0.4 - 1.8 g/dL Corrected   M Protein SerPl Elph-Mcnc 08/31/2018 Not Observed  Not Observed g/dL Corrected   Globulin, Total 08/31/2018 2.5  2.2 - 3.9 g/dL Corrected   Albumin/Glob SerPl 08/31/2018 1.4  0.7 - 1.7 Corrected   IFE 1 08/31/2018 Comment   Corrected   Comment: (NOTE) Immunofixation shows IgA monoclonal protein with  kappa light chain specificity.    Please Note 08/31/2018 Comment   Corrected   Comment: (NOTE) Protein electrophoresis scan will follow via computer, mail, or courier delivery. Performed At: [facility] [place], [place] [id number] Rush Farmer MD VH:[id number]    LDH 08/31/2018 176  98 - 192 U/L Final   Performed at [facility], [address], [place], [place] [address]   Sodium 08/31/2018 140  135 - 145 mmol/L Final   Potassium 08/31/2018 3.7  3.5 - 5.1 mmol/L Final   Chloride 08/31/2018 103  98 - 111 mmol/L Final   CO2 08/31/2018 29  22 - 32 mmol/L Final   Glucose, Bld 08/31/2018 120* 70 - 99 mg/dL Final   BUN 08/31/2018 23  8 - 23 mg/dL Final   Creatinine, Ser 08/31/2018 1.12* 0.44 - 1.00 mg/dL Final   Calcium 08/31/2018 8.8* 8.9 - 10.3 mg/dL Final   Total Protein 08/31/2018 6.6  6.5 - 8.1 g/dL Final   Albumin 08/31/2018 3.9  3.5 - 5.0 g/dL Final   AST 08/31/2018 17  15 - 41 U/L Final   ALT 08/31/2018 14  0 - 44 U/L Final   Alkaline Phosphatase 08/31/2018 93  38 - 126 U/L Final   Total Bilirubin 08/31/2018 0.6  0.3 - 1.2 mg/dL Final   GFR calc non Af Amer 08/31/2018 48* >60 mL/min Final   GFR calc Af Amer 08/31/2018 55* >60 mL/min Final   Anion gap 08/31/2018 8  5 - 15 Final   Performed at [facility], [address], [place], [place] [address]   WBC 08/31/2018 17.0* 4.0 - 10.5 K/uL Final   RBC 08/31/2018 4.04  3.87 - 5.11 MIL/uL Final   Hemoglobin 08/31/2018 12.5  12.0 - 15.0 g/dL Final   HCT 08/31/2018 38.6  36.0 - 46.0 % Final   MCV 08/31/2018 95.5  80.0 - 100.0 fL Final   MCH 08/31/2018 30.9  26.0 - 34.0 pg Final   MCHC 08/31/2018 32.4  30.0 - 36.0 g/dL Final   RDW  08/31/2018 15.4  11.5 - 15.5 % Final   Platelets 08/31/2018 233  150 - 400 K/uL Final   nRBC 08/31/2018 0.0  0.0 - 0.2 % Final   Neutrophils Relative % 08/31/2018 23  % Final   Neutro Abs 08/31/2018 3.9  1.7 - 7.7 K/uL  Final   Lymphocytes Relative 08/31/2018 68  % Final   Lymphs Abs 08/31/2018 11.8* 0.7 - 4.0 K/uL Final   Monocytes Relative 08/31/2018 5  % Final   Monocytes Absolute 08/31/2018 0.8  0.1 - 1.0 K/uL Final   Eosinophils Relative 08/31/2018 3  % Final   Eosinophils Absolute 08/31/2018 0.4  0.0 - 0.5 K/uL Final   Basophils Relative 08/31/2018 1  % Final   Basophils Absolute 08/31/2018 0.1  0.0 - 0.1 K/uL Final   Immature Granulocytes 08/31/2018 0  % Final   Abs Immature Granulocytes 08/31/2018 0.04  0.00 - 0.07 K/uL Final   Performed at [facility], [address], [place], [place] [address]    Assessment:  Jamie Phillips is a 78 y.o. 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  WBC has ranged between 15,700 - 31,600 without trend since 08/15/2015.  She was diagnosed with bilateral massive pulmonary emboli in 08/14/2015.  Chest CT angiogram on 08/14/2015 revealed anacute and subacute pulmonary emboli with evidence of right heart strain (RV/LV Ratio = 1.2) consistent with at least submassive (intermediate risk) PE.  Bilateral lower extremity duplex on 08/15/2015 revealed a right posterior tibial DVT.  She underwent pulmonary angiogram with lysis.  Factor V Leiden and prothrombin gene mutation were negative on 11/14/2015.  She is on chronic Eliquis.  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    M-spike has been followed (gm/dL):  0.1 on 07/16/2012, 0 on 07/28/2014, 0 on 01/26/2015, 0 on 08/01/2015, 0 on 11/14/2015, 0.3 on 07/30/2016, 0.4 on 07/29/2017, and 0 on 08/31/2018.  Kappa free light chains have been followed:  32.73 (ratio 4.41) on 08/01/2015, 26.2 (ratio 4.44) on 07/30/2016, 36.7 (ratio 4.96) on  07/29/2017, and 36.7 (ratio 5.24) on 08/31/2018.  Symptomatically, she denies any fevers, sweats or weight loss.  She has chronic fatigue.  She has sleep apnea and has not had her machine calibrated in several years.  Exam reveals no adenopathy or hepatosplenomegaly.  Plan: 1.   Review labs from 08/31/2018. 2.   Chronic lymphocytic leukemia (CLL)  Review entire medical history, diagnosis and management of CLL.  Patient has stage 0 CLL  Discuss indications for treatment.  Discuss plan for ongoing surveillance. 3.   Monoclonal gammopathy   IgA monoclonal gammopathy of unclear significance.  Discuss periodic assessment every 6 - 12 months. 4.   Pulmonary embolism and RLE DVT  Review diagnosis and management.  Patient had a massive pulmonary embolism requiring thrombolysis.  She is on indefinite anticoagulation.  Continue Eliquis. 5.  Chronic fatigue  Etiology is unclear.  Doubt related to CLL.  She has known sleep apnea without calibration of her CPAP machine in years.  Considering checking TSH (last checked 01/2015). 6.   RTC in 6 months for MD assessment and labs (CBC with diff, CMP, SPEP, LDH).  I discussed the assessment and treatment plan with the patient.  The patient was provided an opportunity to ask questions and all were answered.  The patient agreed with the plan and demonstrated an understanding of the instructions.  The patient was advised to call back if the symptoms worsen or if the condition fails to improve as anticipated.  I provided 25 minutes of face-to-face time during this this encounter and > 50% was spent counseling as documented under my assessment and plan.    Lequita Asal, MD, PhD    09/03/2018, 2:52 PM  I, Molly Dorshimer, am acting as Education administrator for [employer]. Mike Gip, MD, PhD.  I, Natanel Snavely C. Mike Gip, MD, have reviewed the above documentation for accuracy and completeness, and I agree with the above.

## 2018-09-03 ENCOUNTER — Inpatient Hospital Stay (HOSPITAL_BASED_OUTPATIENT_CLINIC_OR_DEPARTMENT_OTHER): Payer: Medicare HMO | Admitting: Hematology and Oncology

## 2018-09-03 ENCOUNTER — Encounter: Payer: Self-pay | Admitting: Hematology and Oncology

## 2018-09-03 ENCOUNTER — Other Ambulatory Visit: Payer: Self-pay

## 2018-09-03 VITALS — BP 151/83 | HR 53 | Temp 97.8°F | Resp 18 | Wt 272.6 lb

## 2018-09-03 DIAGNOSIS — E785 Hyperlipidemia, unspecified: Secondary | ICD-10-CM

## 2018-09-03 DIAGNOSIS — I2782 Chronic pulmonary embolism: Secondary | ICD-10-CM

## 2018-09-03 DIAGNOSIS — F329 Major depressive disorder, single episode, unspecified: Secondary | ICD-10-CM

## 2018-09-03 DIAGNOSIS — G4730 Sleep apnea, unspecified: Secondary | ICD-10-CM | POA: Diagnosis not present

## 2018-09-03 DIAGNOSIS — C9110 Chronic lymphocytic leukemia of B-cell type not having achieved remission: Secondary | ICD-10-CM

## 2018-09-03 DIAGNOSIS — Z79899 Other long term (current) drug therapy: Secondary | ICD-10-CM

## 2018-09-03 DIAGNOSIS — I509 Heart failure, unspecified: Secondary | ICD-10-CM

## 2018-09-03 DIAGNOSIS — F419 Anxiety disorder, unspecified: Secondary | ICD-10-CM

## 2018-09-03 DIAGNOSIS — N189 Chronic kidney disease, unspecified: Secondary | ICD-10-CM | POA: Diagnosis not present

## 2018-09-03 DIAGNOSIS — M129 Arthropathy, unspecified: Secondary | ICD-10-CM

## 2018-09-03 DIAGNOSIS — Z86711 Personal history of pulmonary embolism: Secondary | ICD-10-CM

## 2018-09-03 DIAGNOSIS — D472 Monoclonal gammopathy: Secondary | ICD-10-CM

## 2018-09-03 DIAGNOSIS — M549 Dorsalgia, unspecified: Secondary | ICD-10-CM

## 2018-09-03 DIAGNOSIS — R400 Somnolence: Secondary | ICD-10-CM

## 2018-09-03 DIAGNOSIS — R5383 Other fatigue: Secondary | ICD-10-CM | POA: Diagnosis not present

## 2018-09-03 DIAGNOSIS — Z7901 Long term (current) use of anticoagulants: Secondary | ICD-10-CM

## 2018-09-03 DIAGNOSIS — E669 Obesity, unspecified: Secondary | ICD-10-CM

## 2018-09-03 DIAGNOSIS — I739 Peripheral vascular disease, unspecified: Secondary | ICD-10-CM

## 2018-09-03 DIAGNOSIS — Z86718 Personal history of other venous thrombosis and embolism: Secondary | ICD-10-CM

## 2018-09-03 DIAGNOSIS — E7800 Pure hypercholesterolemia, unspecified: Secondary | ICD-10-CM

## 2018-09-03 DIAGNOSIS — I10 Essential (primary) hypertension: Secondary | ICD-10-CM

## 2018-09-03 DIAGNOSIS — M25569 Pain in unspecified knee: Secondary | ICD-10-CM

## 2018-09-03 DIAGNOSIS — K219 Gastro-esophageal reflux disease without esophagitis: Secondary | ICD-10-CM

## 2018-09-03 NOTE — Progress Notes (Signed)
Pt here for follow up. Previous Dr. Brahmanday patient. Denies any concerns.  

## 2018-09-07 ENCOUNTER — Ambulatory Visit
Admission: RE | Admit: 2018-09-07 | Discharge: 2018-09-07 | Disposition: A | Discharge status: Home / Self Care | Payer: Medicare HMO | Source: Ambulatory Visit | Attending: Physician Assistant | Admitting: Physician Assistant

## 2018-09-07 ENCOUNTER — Other Ambulatory Visit: Payer: Self-pay

## 2018-09-07 ENCOUNTER — Other Ambulatory Visit: Payer: Self-pay | Admitting: Physician Assistant

## 2018-09-07 DIAGNOSIS — N183 Chronic kidney disease, stage 3 (moderate): Secondary | ICD-10-CM | POA: Diagnosis not present

## 2018-09-07 DIAGNOSIS — C9110 Chronic lymphocytic leukemia of B-cell type not having achieved remission: Secondary | ICD-10-CM | POA: Diagnosis not present

## 2018-09-07 DIAGNOSIS — R079 Chest pain, unspecified: Secondary | ICD-10-CM

## 2018-09-07 DIAGNOSIS — I2699 Other pulmonary embolism without acute cor pulmonale: Secondary | ICD-10-CM

## 2018-09-07 DIAGNOSIS — Z86711 Personal history of pulmonary embolism: Secondary | ICD-10-CM | POA: Diagnosis not present

## 2018-09-07 DIAGNOSIS — R0602 Shortness of breath: Secondary | ICD-10-CM

## 2018-09-07 DIAGNOSIS — G4733 Obstructive sleep apnea (adult) (pediatric): Secondary | ICD-10-CM | POA: Diagnosis not present

## 2018-09-07 DIAGNOSIS — R0789 Other chest pain: Secondary | ICD-10-CM | POA: Diagnosis not present

## 2018-09-07 DIAGNOSIS — Z9989 Dependence on other enabling machines and devices: Secondary | ICD-10-CM | POA: Diagnosis not present

## 2018-09-07 DIAGNOSIS — I129 Hypertensive chronic kidney disease with stage 1 through stage 4 chronic kidney disease, or unspecified chronic kidney disease: Secondary | ICD-10-CM | POA: Diagnosis not present

## 2018-09-07 MED ORDER — IOHEXOL 350 MG/ML SOLN
75.0000 mL | Freq: Once | INTRAVENOUS | Status: AC | PRN
  Administered 2018-09-07: 75 mL via INTRAVENOUS

## 2018-09-08 ENCOUNTER — Ambulatory Visit: Admission: RE | Admit: 2018-09-08 | Payer: Medicare HMO | Source: Ambulatory Visit

## 2018-10-06 DIAGNOSIS — I129 Hypertensive chronic kidney disease with stage 1 through stage 4 chronic kidney disease, or unspecified chronic kidney disease: Secondary | ICD-10-CM | POA: Diagnosis not present

## 2018-10-06 DIAGNOSIS — R739 Hyperglycemia, unspecified: Secondary | ICD-10-CM | POA: Diagnosis not present

## 2018-10-06 DIAGNOSIS — E7800 Pure hypercholesterolemia, unspecified: Secondary | ICD-10-CM | POA: Diagnosis not present

## 2018-10-06 DIAGNOSIS — N183 Chronic kidney disease, stage 3 (moderate): Secondary | ICD-10-CM | POA: Diagnosis not present

## 2018-10-09 ENCOUNTER — Encounter: Payer: Self-pay | Admitting: Emergency Medicine

## 2018-10-09 ENCOUNTER — Inpatient Hospital Stay: Payer: Medicare HMO

## 2018-10-09 ENCOUNTER — Inpatient Hospital Stay
Admission: EM | Admit: 2018-10-09 | Discharge: 2018-10-17 | DRG: 330 | Disposition: A | Discharge status: Home / Self Care | Payer: Medicare HMO | Attending: General Surgery | Admitting: General Surgery

## 2018-10-09 ENCOUNTER — Emergency Department: Payer: Medicare HMO

## 2018-10-09 ENCOUNTER — Other Ambulatory Visit: Payer: Self-pay

## 2018-10-09 DIAGNOSIS — I517 Cardiomegaly: Secondary | ICD-10-CM | POA: Diagnosis not present

## 2018-10-09 DIAGNOSIS — K8020 Calculus of gallbladder without cholecystitis without obstruction: Secondary | ICD-10-CM | POA: Diagnosis present

## 2018-10-09 DIAGNOSIS — Z8249 Family history of ischemic heart disease and other diseases of the circulatory system: Secondary | ICD-10-CM | POA: Diagnosis not present

## 2018-10-09 DIAGNOSIS — I739 Peripheral vascular disease, unspecified: Secondary | ICD-10-CM | POA: Diagnosis present

## 2018-10-09 DIAGNOSIS — E7800 Pure hypercholesterolemia, unspecified: Secondary | ICD-10-CM | POA: Diagnosis not present

## 2018-10-09 DIAGNOSIS — M47816 Spondylosis without myelopathy or radiculopathy, lumbar region: Secondary | ICD-10-CM | POA: Diagnosis not present

## 2018-10-09 DIAGNOSIS — E785 Hyperlipidemia, unspecified: Secondary | ICD-10-CM | POA: Diagnosis not present

## 2018-10-09 DIAGNOSIS — H3530 Unspecified macular degeneration: Secondary | ICD-10-CM | POA: Diagnosis present

## 2018-10-09 DIAGNOSIS — N189 Chronic kidney disease, unspecified: Secondary | ICD-10-CM | POA: Diagnosis not present

## 2018-10-09 DIAGNOSIS — R1011 Right upper quadrant pain: Secondary | ICD-10-CM | POA: Diagnosis not present

## 2018-10-09 DIAGNOSIS — Z6841 Body Mass Index (BMI) 40.0 and over, adult: Secondary | ICD-10-CM | POA: Diagnosis not present

## 2018-10-09 DIAGNOSIS — I10 Essential (primary) hypertension: Secondary | ICD-10-CM | POA: Diagnosis not present

## 2018-10-09 DIAGNOSIS — M1990 Unspecified osteoarthritis, unspecified site: Secondary | ICD-10-CM | POA: Diagnosis present

## 2018-10-09 DIAGNOSIS — R1031 Right lower quadrant pain: Secondary | ICD-10-CM | POA: Diagnosis present

## 2018-10-09 DIAGNOSIS — Z981 Arthrodesis status: Secondary | ICD-10-CM

## 2018-10-09 DIAGNOSIS — K5720 Diverticulitis of large intestine with perforation and abscess without bleeding: Principal | ICD-10-CM | POA: Diagnosis present

## 2018-10-09 DIAGNOSIS — K3532 Acute appendicitis with perforation and localized peritonitis, without abscess: Secondary | ICD-10-CM | POA: Diagnosis not present

## 2018-10-09 DIAGNOSIS — D472 Monoclonal gammopathy: Secondary | ICD-10-CM | POA: Diagnosis present

## 2018-10-09 DIAGNOSIS — K219 Gastro-esophageal reflux disease without esophagitis: Secondary | ICD-10-CM | POA: Diagnosis not present

## 2018-10-09 DIAGNOSIS — F419 Anxiety disorder, unspecified: Secondary | ICD-10-CM | POA: Diagnosis present

## 2018-10-09 DIAGNOSIS — I129 Hypertensive chronic kidney disease with stage 1 through stage 4 chronic kidney disease, or unspecified chronic kidney disease: Secondary | ICD-10-CM | POA: Diagnosis present

## 2018-10-09 DIAGNOSIS — Z03818 Encounter for observation for suspected exposure to other biological agents ruled out: Secondary | ICD-10-CM | POA: Diagnosis not present

## 2018-10-09 DIAGNOSIS — R0902 Hypoxemia: Secondary | ICD-10-CM | POA: Diagnosis present

## 2018-10-09 DIAGNOSIS — K3580 Unspecified acute appendicitis: Secondary | ICD-10-CM | POA: Diagnosis not present

## 2018-10-09 DIAGNOSIS — E860 Dehydration: Secondary | ICD-10-CM | POA: Diagnosis not present

## 2018-10-09 DIAGNOSIS — C9110 Chronic lymphocytic leukemia of B-cell type not having achieved remission: Secondary | ICD-10-CM | POA: Diagnosis not present

## 2018-10-09 DIAGNOSIS — Z885 Allergy status to narcotic agent status: Secondary | ICD-10-CM | POA: Diagnosis not present

## 2018-10-09 DIAGNOSIS — Z7901 Long term (current) use of anticoagulants: Secondary | ICD-10-CM | POA: Diagnosis not present

## 2018-10-09 DIAGNOSIS — Z86711 Personal history of pulmonary embolism: Secondary | ICD-10-CM | POA: Diagnosis not present

## 2018-10-09 DIAGNOSIS — M4316 Spondylolisthesis, lumbar region: Secondary | ICD-10-CM | POA: Diagnosis not present

## 2018-10-09 DIAGNOSIS — Z79899 Other long term (current) drug therapy: Secondary | ICD-10-CM

## 2018-10-09 DIAGNOSIS — Z66 Do not resuscitate: Secondary | ICD-10-CM | POA: Diagnosis present

## 2018-10-09 DIAGNOSIS — R109 Unspecified abdominal pain: Secondary | ICD-10-CM | POA: Diagnosis not present

## 2018-10-09 DIAGNOSIS — N183 Chronic kidney disease, stage 3 (moderate): Secondary | ICD-10-CM | POA: Diagnosis not present

## 2018-10-09 DIAGNOSIS — J449 Chronic obstructive pulmonary disease, unspecified: Secondary | ICD-10-CM | POA: Diagnosis not present

## 2018-10-09 DIAGNOSIS — Z1159 Encounter for screening for other viral diseases: Secondary | ICD-10-CM

## 2018-10-09 DIAGNOSIS — G4730 Sleep apnea, unspecified: Secondary | ICD-10-CM | POA: Diagnosis present

## 2018-10-09 DIAGNOSIS — R0600 Dyspnea, unspecified: Secondary | ICD-10-CM

## 2018-10-09 LAB — CBC
HCT: 39.4 % (ref 36.0–46.0)
Hemoglobin: 12.5 g/dL (ref 12.0–15.0)
MCH: 30.6 pg (ref 26.0–34.0)
MCHC: 31.7 g/dL (ref 30.0–36.0)
MCV: 96.3 fL (ref 80.0–100.0)
Platelets: 242 10*3/uL (ref 150–400)
RBC: 4.09 MIL/uL (ref 3.87–5.11)
RDW: 15.1 % (ref 11.5–15.5)
WBC: 17.2 10*3/uL — ABNORMAL HIGH (ref 4.0–10.5)
nRBC: 0 % (ref 0.0–0.2)

## 2018-10-09 LAB — SARS CORONAVIRUS 2 BY RT PCR (HOSPITAL ORDER, PERFORMED IN ~~LOC~~ HOSPITAL LAB): SARS Coronavirus 2: NEGATIVE

## 2018-10-09 LAB — COMPREHENSIVE METABOLIC PANEL
ALT: 13 U/L (ref 0–44)
AST: 16 U/L (ref 15–41)
Albumin: 3.8 g/dL (ref 3.5–5.0)
Alkaline Phosphatase: 97 U/L (ref 38–126)
Anion gap: 10 (ref 5–15)
BUN: 17 mg/dL (ref 8–23)
CO2: 27 mmol/L (ref 22–32)
Calcium: 8.7 mg/dL — ABNORMAL LOW (ref 8.9–10.3)
Chloride: 105 mmol/L (ref 98–111)
Creatinine, Ser: 1.02 mg/dL — ABNORMAL HIGH (ref 0.44–1.00)
GFR calc Af Amer: >60 mL/min (ref >60)
GFR calc non Af Amer: 53 mL/min — ABNORMAL LOW (ref >60)
Glucose, Bld: 128 mg/dL — ABNORMAL HIGH (ref 70–99)
Potassium: 3.6 mmol/L (ref 3.5–5.1)
Sodium: 142 mmol/L (ref 135–145)
Total Bilirubin: 0.7 mg/dL (ref 0.3–1.2)
Total Protein: 6.4 g/dL — ABNORMAL LOW (ref 6.5–8.1)

## 2018-10-09 LAB — URINALYSIS, COMPLETE (UACMP) WITH MICROSCOPIC
Bilirubin Urine: NEGATIVE
Glucose, UA: NEGATIVE mg/dL
Hgb urine dipstick: NEGATIVE
Ketones, ur: NEGATIVE mg/dL
Nitrite: NEGATIVE
Protein, ur: NEGATIVE mg/dL
Specific Gravity, Urine: 1.019 (ref 1.005–1.030)
pH: 5 (ref 5.0–8.0)

## 2018-10-09 LAB — LIPASE, BLOOD: Lipase: 37 U/L (ref 11–51)

## 2018-10-09 MED ORDER — ALBUTEROL SULFATE (2.5 MG/3ML) 0.083% IN NEBU: 2.5000 mg | INHALATION_SOLUTION | Freq: Four times a day (QID) | RESPIRATORY_TRACT | Status: DC | PRN

## 2018-10-09 MED ORDER — ACETAMINOPHEN 650 MG RE SUPP: 650.0000 mg | Freq: Four times a day (QID) | RECTAL | Status: DC | PRN

## 2018-10-09 MED ORDER — DIPHENHYDRAMINE HCL 50 MG/ML IJ SOLN: 12.5000 mg | Freq: Four times a day (QID) | INTRAMUSCULAR | Status: DC | PRN

## 2018-10-09 MED ORDER — HYDROMORPHONE HCL 1 MG/ML IJ SOLN
1.0000 mg | INTRAMUSCULAR | Status: DC | PRN
  Administered 2018-10-09 – 2018-10-13 (×12): 1 mg via INTRAVENOUS
  Filled 2018-10-09 (×12): qty 1

## 2018-10-09 MED ORDER — LOSARTAN POTASSIUM-HCTZ 100-12.5 MG PO TABS: 1.0000 | ORAL_TABLET | Freq: Every day | ORAL | Status: DC

## 2018-10-09 MED ORDER — ACETAMINOPHEN 325 MG PO TABS
650.0000 mg | ORAL_TABLET | Freq: Four times a day (QID) | ORAL | Status: DC | PRN
  Administered 2018-10-16: 08:00:00 650 mg via ORAL
  Filled 2018-10-09: qty 2

## 2018-10-09 MED ORDER — GABAPENTIN 300 MG PO CAPS
300.0000 mg | ORAL_CAPSULE | Freq: Two times a day (BID) | ORAL | Status: DC
  Administered 2018-10-09 – 2018-10-14 (×10): 300 mg via ORAL
  Filled 2018-10-09 (×10): qty 1

## 2018-10-09 MED ORDER — SODIUM CHLORIDE 0.9 % IV BOLUS
1000.0000 mL | Freq: Once | INTRAVENOUS | Status: AC
  Administered 2018-10-09: 1000 mL via INTRAVENOUS

## 2018-10-09 MED ORDER — TRAMADOL HCL 50 MG PO TABS
50.0000 mg | ORAL_TABLET | Freq: Four times a day (QID) | ORAL | Status: DC | PRN
  Administered 2018-10-09 – 2018-10-15 (×7): 50 mg via ORAL
  Filled 2018-10-09 (×7): qty 1

## 2018-10-09 MED ORDER — FENTANYL CITRATE (PF) 100 MCG/2ML IJ SOLN
50.0000 ug | Freq: Once | INTRAMUSCULAR | Status: AC
  Administered 2018-10-09: 50 ug via INTRAVENOUS
  Filled 2018-10-09: qty 2

## 2018-10-09 MED ORDER — DIPHENHYDRAMINE HCL 12.5 MG/5ML PO ELIX
12.5000 mg | ORAL_SOLUTION | Freq: Four times a day (QID) | ORAL | Status: DC | PRN
  Filled 2018-10-09: qty 5

## 2018-10-09 MED ORDER — ONDANSETRON 4 MG PO TBDP: 4.0000 mg | ORAL_TABLET | Freq: Four times a day (QID) | ORAL | Status: DC | PRN

## 2018-10-09 MED ORDER — LOSARTAN POTASSIUM 50 MG PO TABS: 100.0000 mg | ORAL_TABLET | Freq: Every day | ORAL | Status: DC

## 2018-10-09 MED ORDER — PIPERACILLIN-TAZOBACTAM 3.375 G IVPB
3.3750 g | Freq: Three times a day (TID) | INTRAVENOUS | Status: DC
  Administered 2018-10-09 – 2018-10-17 (×24): 3.375 g via INTRAVENOUS
  Filled 2018-10-09 (×24): qty 50

## 2018-10-09 MED ORDER — ONDANSETRON HCL 4 MG/2ML IJ SOLN
4.0000 mg | Freq: Four times a day (QID) | INTRAMUSCULAR | Status: DC | PRN
  Administered 2018-10-11 – 2018-10-15 (×5): 4 mg via INTRAVENOUS
  Filled 2018-10-09 (×5): qty 2

## 2018-10-09 MED ORDER — HYDROCHLOROTHIAZIDE 12.5 MG PO CAPS: 12.5000 mg | ORAL_CAPSULE | Freq: Every day | ORAL | Status: DC

## 2018-10-09 MED ORDER — PANTOPRAZOLE SODIUM 40 MG IV SOLR
40.0000 mg | Freq: Every day | INTRAVENOUS | Status: DC
  Administered 2018-10-09 – 2018-10-13 (×5): 40 mg via INTRAVENOUS
  Filled 2018-10-09 (×5): qty 40

## 2018-10-09 MED ORDER — SODIUM CHLORIDE 0.9 % IV SOLN
INTRAVENOUS | Status: DC
  Administered 2018-10-09 – 2018-10-15 (×11): via INTRAVENOUS

## 2018-10-09 MED ORDER — ESCITALOPRAM OXALATE 10 MG PO TABS
10.0000 mg | ORAL_TABLET | Freq: Every day | ORAL | Status: DC
  Administered 2018-10-11 – 2018-10-17 (×7): 10 mg via ORAL
  Filled 2018-10-09 (×8): qty 1

## 2018-10-09 MED ORDER — PROPRANOLOL HCL 40 MG PO TABS
40.0000 mg | ORAL_TABLET | Freq: Two times a day (BID) | ORAL | Status: DC
  Administered 2018-10-09 – 2018-10-17 (×15): 40 mg via ORAL
  Filled 2018-10-09 (×3): qty 1
  Filled 2018-10-09: qty 2
  Filled 2018-10-09 (×2): qty 1
  Filled 2018-10-09: qty 2
  Filled 2018-10-09 (×5): qty 1
  Filled 2018-10-09: qty 2
  Filled 2018-10-09 (×4): qty 1

## 2018-10-09 MED ORDER — ONDANSETRON HCL 4 MG/2ML IJ SOLN
4.0000 mg | Freq: Once | INTRAMUSCULAR | Status: AC
  Administered 2018-10-09: 4 mg via INTRAVENOUS
  Filled 2018-10-09: qty 2

## 2018-10-09 MED ORDER — KETOROLAC TROMETHAMINE 30 MG/ML IJ SOLN
15.0000 mg | Freq: Once | INTRAMUSCULAR | Status: AC
  Administered 2018-10-09: 15 mg via INTRAVENOUS
  Filled 2018-10-09: qty 1

## 2018-10-09 NOTE — ED Notes (Signed)
ED TO INPATIENT HANDOFF REPORT  ED Nurse Name and Phone #: Vicente Males [place] Name/Age/Gender Jamie Phillips 78 y.o. female Room/Bed: ED02A/ED02A  Code Status   Code Status: Full Code  Home/SNF/Other Home Patient oriented to: self, place, time and situation Is this baseline? Yes   Triage Complete: Triage complete  Chief Complaint rt sided abd pain  Triage Note Pt to ER via POV with c/o right "side" and abdominal pain starting around 2:30 AM.  Pt denies n/v/d or urinary symptoms.  Pt states no hx of similar pain.  Pt leans over in pain during exam.   Allergies Allergies  Allergen Reactions  . Oxycodone Other (See Comments)    Reaction:  Hallucinations  . Morphine Other (See Comments)    Reaction:  Hallucinations    Level of Care/Admitting Diagnosis ED Disposition    ED Disposition Condition [facility] Area: [place] [[id number]]  Level of Care: Med-Surg [16]  Covid Evaluation: Screening Protocol (No Symptoms)  Diagnosis: Right lower quadrant pain [638756]  Admitting Physician: Herbert Pun [[id number]]  Attending Physician: Herbert Pun [[id number]]  Estimated length of stay: past midnight tomorrow  Certification:: I certify this patient will need inpatient services for at least 2 midnights  PT Class (Do Not Modify): Inpatient [101]  PT Acc Code (Do Not Modify): Private [1]       B Medical/Surgery History Past Medical History:  Diagnosis Date  . Anginal pain ([place])    atypical chest pain  . Anxiety   . Arthritis   . Back pain   . Cancer ([place])    cll  . Chronic kidney disease   . CLL (chronic lymphocytic leukemia) ([place])   . Depression   . GERD (gastroesophageal reflux disease)   . Headache   . Hemorrhoids   . Hypercholesteremia   . Hyperlipidemia   . Hypertension   . 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 Macular degeneration   . MGUS (monoclonal gammopathy of unknown significance)    . Obesity   . Pulmonary embolism ([place])   . PVD (peripheral vascular disease) ([place])   . Sleep apnea    Past Surgical History:  Procedure Laterality Date  . BREAST BIOPSY Left 08/19/04   lt bx/clip-neg  . COLONOSCOPY    . COLONOSCOPY WITH PROPOFOL N/A 03/10/2017   Procedure: COLONOSCOPY WITH PROPOFOL;  Surgeon: [name], MD;  Location: [facility] ENDOSCOPY;  Service: Endoscopy;  Laterality: N/A;  . IVC FILTER INSERTION N/A 06/11/2016   Procedure: IVC Filter Insertion;  Surgeon: Katha Cabal, MD;  Location: [place] CV LAB;  Service: Cardiovascular;  Laterality: N/A;  . IVC FILTER REMOVAL N/A 05/06/2017   Procedure: IVC FILTER REMOVAL;  Surgeon: Katha Cabal, MD;  Location: [place] CV LAB;  Service: Cardiovascular;  Laterality: N/A;  . JOINT REPLACEMENT Left 06/17/2016  . neck fusion    . PERIPHERAL VASCULAR CATHETERIZATION N/A 08/15/2015   Procedure: pulmonary angiogram with lysis;  Surgeon: Katha Cabal, MD;  Location: [place] CV LAB;  Service: Cardiovascular;  Laterality: N/A;  . WRIST SURGERY Left 2000     A IV Location/Drains/Wounds Patient Lines/Drains/Airways Status   Active Line/Drains/Airways    Name:   Placement date:   Placement time:   Site:   Days:   Peripheral IV 09/07/18 Left Antecubital   09/07/18    1457    Antecubital   32   Peripheral IV 10/09/18 Right Hand  10/09/18    1046    Hand   less than 1          Intake/Output Last 24 hours No intake or output data in the 24 hours ending 10/09/18 1439  Labs/Imaging Results for orders placed or performed during the hospital encounter of 10/09/18 (from the past 48 hour(s))  Lipase, blood     Status: None   Collection Time: 10/09/18 10:03 AM  Result Value Ref Range   Lipase 37 11 - 51 U/L    Comment: Performed at [facility], [address] [place], [place] [address]  Comprehensive metabolic panel     Status: Abnormal   Collection Time: 10/09/18 10:03 AM  Result Value  Ref Range   Sodium 142 135 - 145 mmol/L   Potassium 3.6 3.5 - 5.1 mmol/L   Chloride 105 98 - 111 mmol/L   CO2 27 22 - 32 mmol/L   Glucose, Bld 128 (H) 70 - 99 mg/dL   BUN 17 8 - 23 mg/dL   Creatinine, Ser 1.02 (H) 0.44 - 1.00 mg/dL   Calcium 8.7 (L) 8.9 - 10.3 mg/dL   Total Protein 6.4 (L) 6.5 - 8.1 g/dL   Albumin 3.8 3.5 - 5.0 g/dL   AST 16 15 - 41 U/L   ALT 13 0 - 44 U/L   Alkaline Phosphatase 97 38 - 126 U/L   Total Bilirubin 0.7 0.3 - 1.2 mg/dL   GFR calc non Af Amer 53 (L) >60 mL/min   GFR calc Af Amer >60 >60 mL/min   Anion gap 10 5 - 15    Comment: Performed at [facility], [address] [place], [place] [address]  CBC     Status: Abnormal   Collection Time: 10/09/18 10:03 AM  Result Value Ref Range   WBC 17.2 (H) 4.0 - 10.5 K/uL   RBC 4.09 3.87 - 5.11 MIL/uL   Hemoglobin 12.5 12.0 - 15.0 g/dL   HCT 39.4 36.0 - 46.0 %   MCV 96.3 80.0 - 100.0 fL   MCH 30.6 26.0 - 34.0 pg   MCHC 31.7 30.0 - 36.0 g/dL   RDW 15.1 11.5 - 15.5 %   Platelets 242 150 - 400 K/uL   nRBC 0.0 0.0 - 0.2 %    Comment: Performed at [facility], [address] [place], [place] [address]   Ct Renal Stone Study  Result Date: 10/09/2018 CLINICAL DATA:  Right-sided abdominal pain since this morning EXAM: CT ABDOMEN AND PELVIS WITHOUT CONTRAST TECHNIQUE: Multidetector CT imaging of the abdomen and pelvis was performed following the standard protocol without IV contrast. COMPARISON:  CT chest and previous FINDINGS: Lower chest: No pleural or pericardial effusion. Progressive coarse consolidation/subsegmental atelectasis in posterior and medial basal segments right lower lobe. Hepatobiliary: Multiple stones in the nondilated gallbladder. No focal liver lesion or biliary ductal dilatation. Pancreas: Unremarkable. No pancreatic ductal dilatation or surrounding inflammatory changes. Spleen: Normal in size without focal abnormality. Adrenals/Urinary Tract: 1.9 cm exophytic low-attenuation  lesion from the lower pole right kidney, stable since MR 04/10/2018, likely cyst but incompletely characterized. No urolithiasis or hydronephrosis. Adrenal glands unremarkable. Stomach/Bowel: The stomach is incompletely distended limiting gastric wall valuation. Small bowel is decompressed. Appendix is not identified. Mild inflammatory/edematous change in the lateral conal fascia adjacent to the cecum. No abscess. The colon is nondilated, with scattered diverticula. Vascular/Lymphatic: Moderate aortoiliac calcified atheromatous plaque without aneurysm. No abdominal or pelvic adenopathy. Reproductive: Lobular uterus with coarse calcifications probably  leiomyomatous. 3.4 cm left ovarian cyst, stable. Other: Bilateral pelvic phleboliths. No ascites. No free air. Musculoskeletal: Spondylitic changes in the lower lumbar spine with grade 1 anterolisthesis L3-4 and L4-5. IMPRESSION: 1. Mild inflammatory/edematous change in the lateral conal fascia adjacent to the cecum, without abscess. The appendix is not confidently identified. 2. Cholelithiasis. 3. Progressive coarse consolidation/atelectasis in the posterior and medial basal segments right lower lobe. Electronically Signed   By: Lucrezia Europe M.D.   On: 10/09/2018 11:41    Pending Labs Unresulted Labs (From admission, onward)    Start     Ordered   10/10/18 1610  Basic metabolic panel  Tomorrow morning,   STAT     10/09/18 1408   10/10/18 0500  CBC  Tomorrow morning,   STAT     10/09/18 1408   10/09/18 1409  SARS Coronavirus 2 (CEPHEID - Performed in [facility] hospital lab), [place] - STAT,   STAT    Question:  Rule Out  Answer:  Yes   10/09/18 1408   10/09/18 0950  Urinalysis, Complete w Microscopic  ONCE - STAT,   STAT     10/09/18 0951          Vitals/Pain Today's Vitals   10/09/18 1245 10/09/18 1300 10/09/18 1400 10/09/18 1430  BP:  124/66 (!) 123/59 120/70  Pulse:  65 62 63  Resp:   (!) 26 (!) 24  Temp:      SpO2:  (!) 87% 96%  97%  Weight:      Height:      PainSc: 3        Isolation Precautions No active isolations  Medications Medications  losartan-hydrochlorothiazide (HYZAAR) 100-12.5 MG per tablet 1 tablet (has no administration in time range)  propranolol (INDERAL) tablet 40 mg (has no administration in time range)  escitalopram (LEXAPRO) tablet 10 mg (has no administration in time range)  gabapentin (NEURONTIN) capsule 300 mg (has no administration in time range)  albuterol (VENTOLIN HFA) 108 (90 Base) MCG/ACT inhaler 2 puff (has no administration in time range)  piperacillin-tazobactam (ZOSYN) IVPB 3.375 g (has no administration in time range)  acetaminophen (TYLENOL) tablet 650 mg (has no administration in time range)    Or  acetaminophen (TYLENOL) suppository 650 mg (has no administration in time range)  HYDROmorphone (DILAUDID) injection 1 mg (has no administration in time range)  traMADol (ULTRAM) tablet 50 mg (has no administration in time range)  diphenhydrAMINE (BENADRYL) 12.5 MG/5ML elixir 12.5 mg (has no administration in time range)    Or  diphenhydrAMINE (BENADRYL) injection 12.5 mg (has no administration in time range)  ondansetron (ZOFRAN-ODT) disintegrating tablet 4 mg (has no administration in time range)    Or  ondansetron (ZOFRAN) injection 4 mg (has no administration in time range)  pantoprazole (PROTONIX) injection 40 mg (has no administration in time range)  ketorolac (TORADOL) 30 MG/ML injection 15 mg (15 mg Intravenous Given 10/09/18 1136)  ondansetron (ZOFRAN) injection 4 mg (4 mg Intravenous Given 10/09/18 1136)  sodium chloride 0.9 % bolus 1,000 mL (1,000 mLs Intravenous New Bag/Given 10/09/18 1136)  fentaNYL (SUBLIMAZE) injection 50 mcg (50 mcg Intravenous Given 10/09/18 1217)    Mobility walks Low fall risk   Focused Assessments GI assessment   R Recommendations: See Admitting Provider Note  Report given to:   Additional Notes:

## 2018-10-09 NOTE — H&P (Signed)
SURGICAL CONSULTATION NOTE   HISTORY OF PRESENT ILLNESS (HPI):  78 y.o. female presented to [facility] ED for evaluation of abdominal pain since last night. Patient reports having abdominal pain that started last night.  She reports that the pain has been on the right lower quadrant to right flank.  She denies any other radiation of the pain anywhere else.  Pain is aggravated by pressing the right side of the abdomen.  There is no alleviating factor.  Patient reports pain today is 10 out of 10.  Pain medication starting to work at this moment.  Patient denies having this pain before.  Patient denies fever or chills.  Patient states having nausea but no vomiting.  She denies previous abdominal surgery.  She denies diarrhea.  Denies coughing or shortness of breath.  She denies chest pain.  Surgery is consulted by Dr. Kerman Passey in this context for evaluation and management of suspected appendicitis.  PAST MEDICAL HISTORY (PMH):  Past Medical History:  Diagnosis Date  . Anginal pain ([place])    atypical chest pain  . Anxiety   . Arthritis   . Back pain   . Cancer ([place])    cll  . Chronic kidney disease   . CLL (chronic lymphocytic leukemia) ([place])   . Depression   . GERD (gastroesophageal reflux disease)   . Headache   . Hemorrhoids   . Hypercholesteremia   . Hyperlipidemia   . Hypertension   . 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 Macular degeneration   . MGUS (monoclonal gammopathy of unknown significance)   . Obesity   . Pulmonary embolism ([place])   . PVD (peripheral vascular disease) ([place])   . Sleep apnea      PAST SURGICAL HISTORY [facility]):  Past Surgical History:  Procedure Laterality Date  . BREAST BIOPSY Left 08/19/04   lt bx/clip-neg  . COLONOSCOPY    . COLONOSCOPY WITH PROPOFOL N/A 03/10/2017   Procedure: COLONOSCOPY WITH PROPOFOL;  Surgeon: [name], MD;  Location: [facility] ENDOSCOPY;  Service: Endoscopy;  Laterality: N/A;  . IVC FILTER INSERTION N/A  06/11/2016   Procedure: IVC Filter Insertion;  Surgeon: Katha Cabal, MD;  Location: [place] CV LAB;  Service: Cardiovascular;  Laterality: N/A;  . IVC FILTER REMOVAL N/A 05/06/2017   Procedure: IVC FILTER REMOVAL;  Surgeon: Katha Cabal, MD;  Location: [place] CV LAB;  Service: Cardiovascular;  Laterality: N/A;  . JOINT REPLACEMENT Left 06/17/2016  . neck fusion    . PERIPHERAL VASCULAR CATHETERIZATION N/A 08/15/2015   Procedure: pulmonary angiogram with lysis;  Surgeon: Katha Cabal, MD;  Location: [place] CV LAB;  Service: Cardiovascular;  Laterality: N/A;  . WRIST SURGERY Left 2000     MEDICATIONS:  Prior to Admission medications   Medication Sig Start Date End Date Taking? Authorizing Provider  acetaminophen (TYLENOL) 650 MG CR tablet Take 650-1,300 mg by mouth every 8 (eight) hours as needed for pain. TYLENOL ARTHRITIS   Yes [provider]  albuterol (PROVENTIL HFA;VENTOLIN HFA) 108 (90 Base) MCG/ACT inhaler Inhale 2 puffs into the lungs every 6 (six) hours as needed for wheezing or shortness of breath.   Yes [provider]  amoxicillin (AMOXIL) 500 MG capsule Take 2,000 mg by mouth. Prior to dental appointments 04/23/18  Yes [provider]  ELIQUIS 5 MG TABS tablet TAKE 1 TABLET TWICE DAILY Patient taking differently: Take 5 mg by mouth 2 (two) times daily.  01/03/16  Yes Brahmanday,  Elisha Headland, MD  escitalopram (LEXAPRO) 10 MG tablet Take 10 mg by mouth daily.   Yes [provider]  gabapentin (NEURONTIN) 300 MG capsule Take 300 mg by mouth 2 (two) times daily.    Yes [provider]  losartan-hydrochlorothiazide (HYZAAR) 100-12.5 MG tablet Take 1 tablet by mouth daily. Reported on 08/22/2015   Yes [provider]  Multiple Vitamins-Minerals (PRESERVISION AREDS 2 PO) Take 1 capsule by mouth 2 (two) times daily.    Yes [provider]  pantoprazole (PROTONIX) 40 MG tablet Take 40 mg by mouth  daily.   Yes [provider]  pravastatin (PRAVACHOL) 40 MG tablet Take 40 mg by mouth daily.    Yes [provider]  propranolol (INDERAL) 40 MG tablet Take 40 mg by mouth 2 (two) times a day.   Yes [provider]  tiZANidine (ZANAFLEX) 4 MG tablet Take 4 mg by mouth at bedtime as needed for muscle spasms.     [provider]     ALLERGIES:  Allergies  Allergen Reactions  . Oxycodone Other (See Comments)    Reaction:  Hallucinations  . Morphine Other (See Comments)    Reaction:  Hallucinations     SOCIAL HISTORY:  Social History   Socioeconomic History  . Marital status: Widowed    Spouse name: Not on file  . Number of children: Not on file  . Years of education: Not on file  . Highest education level: Not on file  Occupational History  . Not on file  Social Needs  . Financial resource strain: Not on file  . Food insecurity    Worry: Not on file    Inability: Not on file  . Transportation needs    Medical: Not on file    Non-medical: Not on file  Tobacco Use  . Smoking status: Never Smoker  . Smokeless tobacco: Never Used  Substance and Sexual Activity  . Alcohol use: No    Alcohol/week: 0.0 standard drinks  . Drug use: No  . Sexual activity: Never  Lifestyle  . Physical activity    Days per week: Not on file    Minutes per session: Not on file  . Stress: Not on file  Relationships  . Social Herbalist on phone: Not on file    Gets together: Not on file    Attends religious service: Not on file    Active member of club or organization: Not on file    Attends meetings of clubs or organizations: Not on file    Relationship status: Not on file  . Intimate partner violence    Fear of current or ex partner: Not on file    Emotionally abused: Not on file    Physically abused: Not on file    Forced sexual activity: Not on file  Other Topics Concern  . Not on file  Social History Narrative   ** Merged History  Encounter **        The patient currently resides (home / rehab facility / nursing home): Home The patient normally is (ambulatory / bedbound): Ambulatory   FAMILY HISTORY:  Family History  Problem Relation Age of Onset  . Heart failure Father   . Congestive Heart Failure Father   . Pulmonary embolism Mother   . Breast cancer Neg Hx      REVIEW OF SYSTEMS:  Constitutional: denies weight loss, fever, chills, or sweats  Eyes: denies any other vision changes, history  of eye injury  ENT: denies sore throat, hearing problems  Respiratory: denies shortness of breath, wheezing  Cardiovascular: denies chest pain, palpitations  Gastrointestinal: positive abdominal pain, positive nausea and negative vomiting Genitourinary: denies burning with urination or urinary frequency Musculoskeletal: denies any other joint pains or cramps  Skin: denies any other rashes or skin discolorations  Neurological: denies any other headache, dizziness, weakness  Psychiatric: denies any other depression, anxiety   All other review of systems were negative   VITAL SIGNS:  Temp:  [98.4 F (36.9 C)] 98.4 F (36.9 C) (06/19 0947) Pulse Rate:  [64-71] 66 (06/19 1230) Resp:  [16-18] 16 (06/19 1219) BP: (122-140)/(61-71) 122/67 (06/19 1230) SpO2:  [83 %-96 %] 83 % (06/19 1230) Weight:  [122 kg] 122 kg (06/19 0948)     Height: 5\' 5"  (165.1 cm) Weight: 122 kg BMI (Calculated): 44.76   INTAKE/OUTPUT:  This shift: No intake/output data recorded.  Last 2 shifts: @IOLAST2SHIFTS @   PHYSICAL EXAM:  Constitutional:  -- Normal body habitus  -- Awake, alert, and oriented x3  Eyes:  -- Pupils equally round and reactive to light  -- No scleral icterus  Ear, nose, and throat:  -- No jugular venous distension  Pulmonary:  -- No crackles  -- Equal breath sounds bilaterally -- Breathing non-labored at rest Cardiovascular:  -- S1, S2 present  -- No pericardial rubs Gastrointestinal:  -- Abdomen soft, tender on  right lower quadrant and right flank, non-distended, no guarding or rebound tenderness -- No abdominal masses appreciated, pulsatile or otherwise  Musculoskeletal and Integumentary:  -- Wounds or skin discoloration: None appreciated -- Extremities: B/L UE and LE FROM, hands and feet warm, no edema  Neurologic:  -- Motor function: intact and symmetric -- Sensation: intact and symmetric   Labs:  CBC Latest Ref Rng & Units 10/09/2018 08/31/2018 01/27/2018  WBC 4.0 - 10.5 K/uL 17.2(H) 17.0(H) 21.4(H)  Hemoglobin 12.0 - 15.0 g/dL 12.5 12.5 12.9  Hematocrit 36.0 - 46.0 % 39.4 38.6 40.8  Platelets 150 - 400 K/uL 242 233 253   CMP Latest Ref Rng & Units 10/09/2018 08/31/2018 01/27/2018  Glucose 70 - 99 mg/dL 128(H) 120(H) 128(H)  BUN 8 - 23 mg/dL 17 23 22   Creatinine 0.44 - 1.00 mg/dL 1.02(H) 1.12(H) 1.06(H)  Sodium 135 - 145 mmol/L 142 140 138  Potassium 3.5 - 5.1 mmol/L 3.6 3.7 3.9  Chloride 98 - 111 mmol/L 105 103 100  CO2 22 - 32 mmol/L 27 29 29   Calcium 8.9 - 10.3 mg/dL 8.7(L) 8.8(L) 8.8(L)  Total Protein 6.5 - 8.1 g/dL 6.4(L) 6.6 6.6  Total Bilirubin 0.3 - 1.2 mg/dL 0.7 0.6 0.9  Alkaline Phos 38 - 126 U/L 97 93 99  AST 15 - 41 U/L 16 17 16   ALT 0 - 44 U/L 13 14 17    Imaging studies:  EXAM: CT ABDOMEN AND PELVIS WITHOUT CONTRAST  TECHNIQUE: Multidetector CT imaging of the abdomen and pelvis was performed following the standard protocol without IV contrast.  COMPARISON:  CT chest and previous  FINDINGS: Lower chest: No pleural or pericardial effusion. Progressive coarse consolidation/subsegmental atelectasis in posterior and medial basal segments right lower lobe.  Hepatobiliary: Multiple stones in the nondilated gallbladder. No focal liver lesion or biliary ductal dilatation.  Pancreas: Unremarkable. No pancreatic ductal dilatation or surrounding inflammatory changes.  Spleen: Normal in size without focal abnormality.  Adrenals/Urinary Tract: 1.9 cm exophytic  low-attenuation lesion from the lower pole right kidney, stable since MR 04/10/2018, likely cyst  but incompletely characterized. No urolithiasis or hydronephrosis. Adrenal glands unremarkable.  Stomach/Bowel: The stomach is incompletely distended limiting gastric wall valuation. Small bowel is decompressed. Appendix is not identified. Mild inflammatory/edematous change in the lateral conal fascia adjacent to the cecum. No abscess. The colon is nondilated, with scattered diverticula.  Vascular/Lymphatic: Moderate aortoiliac calcified atheromatous plaque without aneurysm. No abdominal or pelvic adenopathy.  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 3.4 cm left ovarian cyst, stable.  Other: Bilateral pelvic phleboliths. No ascites. No free air.  Musculoskeletal: Spondylitic changes in the lower lumbar spine with grade 1 anterolisthesis L3-4 and L4-5.  IMPRESSION: 1. Mild inflammatory/edematous change in the lateral conal fascia adjacent to the cecum, without abscess. The appendix is not confidently identified. 2. Cholelithiasis. 3. Progressive coarse consolidation/atelectasis in the posterior and medial basal segments right lower lobe.   Electronically Signed   By: Lucrezia Europe M.D.   On: 10/09/2018 11:41  Assessment/Plan:  78 y.o. 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  The patient has history, physical exam that are concerning of acute appendicitis.  The CT scan shows inflammation around the cecal area but there is no appendix visualized.  I personally reviewed the images.  The CT scan was done without contrast.  There is no suspected fluid collection or abscess.  There is no free fluid.  I also evaluated a previous CT scan 2 years ago that did  not have the fat stranding but the appendix was nonvisualized either.  On the physical exam the pain is very localized on the right lower quadrant to the right flank.  With her obesity, the physical exam is not reliable.  Due to her history of bilateral PE and patient current anticoagulation with last dose of Eliquis this morning, I will start the patient on antibiotic therapy and pain management.  I will follow her closely with physical exam and vital signs to see how she responds to antibiotic therapy.  If the pain does not improve patient might need diagnostic laparoscopy with possible appendectomy.  Patient understand that this is a high risk surgery due to her current anticoagulation, her history of PE and her multiple medical conditions.  I will consult hospitalist due to persistent O2 low sat during my evaluation.  I will order rapid test for coronavirus due to the possibility of patient needing to go to the OR at any moment.  Again the plan is to give at least 24 hours of IV antibiotics still that the Eliquis get out of her system and possible appendectomy if pain does not improve and the suspicious of appendicitis continue to be high.  If patient get worse during the day today she will need to have organ appendectomy even under the effects of Eliquis.  Patient was oriented that surgical management will start with diagnostic laparoscopy and will try to complete the surgery laparoscopically but due to her high risk of bleeding the chances of converting to open surgery are much higher.  Patient oriented about goals of surgery and its risk including: bowel injury, infection, abscess, bleeding, leak from cecum, intestinal adhesions, bowel obstruction, fistula, injury to the ureter among others.  Patient understood and agreed to proceed with surgery if necessary.   Arnold Long, MD

## 2018-10-09 NOTE — ED Notes (Signed)
Dr. Bridgett Larsson in room to assess this patient at this time.

## 2018-10-09 NOTE — Consult Note (Signed)
[facility] NAME: Jamie Phillips    MR#:  [id number]  DATE OF BIRTH:  Jun 26, 1940  DATE OF ADMISSION:  10/09/2018  PRIMARY CARE PHYSICIAN: Kirk Ruths, MD   REQUESTING/REFERRING PHYSICIAN: Herbert Pun, MD  CHIEF COMPLAINT:   Chief Complaint  Patient presents with   Abdominal Pain   Abdominal pain today. HISTORY OF PRESENT ILLNESS:  Jamie Phillips  is a 78 y.o. female with a known history of anxiety, arthritis, back pain, CLL, CKD, PE, hypertension, hyperlipidemia and sleep apnea etc. The is being admitted for acute appendicitis.  Surgeon request medical consult due to shortness of breath.  The patient has hypoxia at 87% in the ED and put on oxygen by nasal cannula 2 L.  The patient complains of abdominal pain on the right lower quadrant with nausea.  She denies any shortness of breath or cough, no leg swelling. 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)    Macular degeneration    MGUS (monoclonal gammopathy of unknown significance)    Obesity    Pulmonary embolism (H[facility])    PVD (peripheral vascular disease) ([place])    Sleep apnea     PAST SURGICAL HISTORY:   Past Surgical History:  Procedure Laterality Date   BREAST BIOPSY Left 08/19/04   lt bx/clip-neg   COLONOSCOPY     COLONOSCOPY WITH PROPOFOL N/A 03/10/2017   Procedure: COLONOSCOPY WITH PROPOFOL;  Surgeon: [name], MD;  Location: [facility] ENDOSCOPY;  Service: Endoscopy;  Laterality: N/A;   IVC FILTER INSERTION N/A 06/11/2016   Procedure: IVC Filter Insertion;  Surgeon: Katha Cabal, MD;  Location: [facility] LAB;  Service: Cardiovascular;  Laterality: N/A;   IVC FILTER REMOVAL N/A 05/06/2017   Procedure: IVC FILTER REMOVAL;  Surgeon: Katha Cabal, MD;  Location: [facility];  Service: Cardiovascular;  Laterality: N/A;   JOINT REPLACEMENT Left 06/17/2016   neck fusion     PERIPHERAL VASCULAR CATHETERIZATION N/A 08/15/2015   Procedure: pulmonary angiogram with lysis;  Surgeon: Katha Cabal, MD;  Location: [place] [facility];  Service: Cardiovascular;  Laterality: N/A;   WRIST SURGERY Left 2000    SOCIAL HISTORY:   Social History   Tobacco Use   Smoking status: Never Smoker   Smokeless tobacco: Never Used  Substance Use Topics   Alcohol use: No    Alcohol/week: 0.0 standard drinks    FAMILY HISTORY:   Family History  Problem Relation Age of Onset   Heart failure Father    Congestive Heart Failure Father    Pulmonary embolism Mother    Breast cancer Neg Hx     DRUG ALLERGIES:   Allergies  Allergen Reactions   Oxycodone Other (See Comments)    Reaction:  Hallucinations   Morphine Other (See Comments)    Reaction:  Hallucinations    REVIEW OF SYSTEMS:   Review of Systems  Constitutional: Negative for chills,  fever and malaise/fatigue.  HENT: Negative for sore throat.   Eyes: Negative for blurred vision and double vision.  Respiratory: Negative for cough, hemoptysis, shortness of breath, wheezing and stridor.   Cardiovascular: Negative for chest pain, palpitations, orthopnea and leg swelling.  Gastrointestinal: Positive for nausea. Negative for abdominal pain, blood in stool, diarrhea, melena and vomiting.  Genitourinary: Negative for dysuria, flank pain and hematuria.  Musculoskeletal: Negative for back pain and joint pain.  Skin: Negative for rash.  Neurological: Negative for dizziness, sensory change, focal weakness, seizures, loss of  consciousness, weakness and headaches.  Endo/Heme/Allergies: Negative for polydipsia.  Psychiatric/Behavioral: Negative for depression. The patient is not nervous/anxious.     MEDICATIONS AT HOME:   Prior to Admission medications   Medication Sig Start Date End Date Taking? Authorizing Provider  acetaminophen (TYLENOL) 650 MG CR tablet Take 650-1,300 mg by mouth every 8 (eight) hours as needed for pain. TYLENOL ARTHRITIS   Yes [provider]  albuterol (PROVENTIL HFA;VENTOLIN HFA) 108 (90 Base) MCG/ACT inhaler Inhale 2 puffs into the lungs every 6 (six) hours as needed for wheezing or shortness of breath.   Yes [provider]  amoxicillin (AMOXIL) 500 MG capsule Take 2,000 mg by mouth. Prior to dental appointments 04/23/18  Yes [provider]  ELIQUIS 5 MG TABS tablet TAKE 1 TABLET TWICE DAILY Patient taking differently: Take 5 mg by mouth 2 (two) times daily.  01/03/16  Yes Cammie Sickle, MD  escitalopram (LEXAPRO) 10 MG tablet Take 10 mg by mouth daily.   Yes [provider]  gabapentin (NEURONTIN) 300 MG capsule Take 300 mg by mouth 2 (two) times daily.    Yes [provider]  losartan-hydrochlorothiazide (HYZAAR) 100-12.5 MG tablet Take 1 tablet by mouth daily. Reported on 08/22/2015   Yes [provider]  Multiple Vitamins-Minerals (PRESERVISION AREDS 2 PO) Take 1 capsule by mouth 2 (two) times daily.    Yes [provider]  pantoprazole (PROTONIX) 40 MG tablet Take 40 mg by mouth daily.   Yes [provider]  pravastatin (PRAVACHOL) 40 MG tablet Take 40 mg by mouth daily.    Yes [provider]  propranolol (INDERAL) 40 MG tablet Take 40 mg by mouth 2 (two) times a day.   Yes [provider]  tiZANidine (ZANAFLEX) 4 MG tablet Take 4 mg by mouth at bedtime as needed for muscle spasms.     [provider]      VITAL SIGNS:  Blood pressure 120/70, pulse 63, temperature 98.4 F (36.9  C), resp. rate (!) 24, height 5\' 5"  (1.651 m), weight 122 kg, SpO2 97 %.  PHYSICAL EXAMINATION:  Physical Exam  GENERAL:  78 y.o.-year-old patient lying in the bed with no acute distress.  EYES: Pupils equal, round, reactive to light and accommodation. No scleral icterus. Extraocular muscles intact.  HEENT: Head atraumatic, normocephalic. Oropharynx and nasopharynx clear.  NECK:  Supple, no jugular venous distention. No thyroid enlargement, no tenderness.  LUNGS: Normal breath sounds bilaterally, no wheezing, rales,rhonchi or crepitation. No use of accessory muscles of respiration.  CARDIOVASCULAR: S1, S2 normal. No murmurs, rubs, or gallops.  ABDOMEN: Soft, tenderness in RLQ, nondistended. Bowel sounds present. No organomegaly or mass.  EXTREMITIES: No pedal edema, cyanosis, or clubbing.  NEUROLOGIC: Cranial nerves II through XII are intact. Muscle strength 5/5 in all extremities. Sensation intact. Gait not checked.  PSYCHIATRIC: The patient is alert and oriented x 3.  SKIN: No obvious rash, lesion, or ulcer.  LABORATORY PANEL:   CBC Recent Labs  Lab 10/09/18 1003  WBC 17.2*  HGB 12.5  HCT 39.4  PLT 242   ------------------------------------------------------------------------------------------------------------------  Chemistries  Recent Labs  Lab 10/09/18 1003  NA 142  K 3.6  CL 105  CO2 27  GLUCOSE 128*  BUN 17  CREATININE 1.02*  CALCIUM 8.7*  AST 16  ALT 13  ALKPHOS 97  BILITOT 0.7   ------------------------------------------------------------------------------------------------------------------  Cardiac Enzymes No results for input(s): TROPONINI in the last 168 hours. ------------------------------------------------------------------------------------------------------------------  RADIOLOGY:  Ct Renal Stone Study  Result Date: 10/09/2018 CLINICAL DATA:  Right-sided abdominal pain since this morning EXAM: CT ABDOMEN AND PELVIS WITHOUT CONTRAST  TECHNIQUE: Multidetector CT imaging of the abdomen and pelvis was performed following the standard protocol without IV contrast. COMPARISON:  CT chest and previous FINDINGS: Lower chest: No pleural or pericardial effusion. Progressive coarse consolidation/subsegmental atelectasis in posterior and medial basal segments right lower lobe. Hepatobiliary: Multiple stones in the nondilated gallbladder. No focal liver lesion or biliary ductal dilatation. Pancreas: Unremarkable. No pancreatic ductal dilatation or surrounding inflammatory changes. Spleen: Normal in size without focal abnormality. Adrenals/Urinary Tract: 1.9 cm exophytic low-attenuation lesion from the lower pole right kidney, stable since MR 04/10/2018, likely cyst but incompletely characterized. No urolithiasis or hydronephrosis. Adrenal glands unremarkable. Stomach/Bowel: The stomach is incompletely distended limiting gastric wall valuation. Small bowel is decompressed. Appendix is not identified. Mild inflammatory/edematous change in the lateral conal fascia adjacent to the cecum. No abscess. The colon is nondilated, with scattered diverticula. Vascular/Lymphatic: Moderate aortoiliac calcified atheromatous plaque without aneurysm. No abdominal or pelvic adenopathy. 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 3.4 cm left ovarian cyst, stable. Other: Bilateral pelvic phleboliths. No ascites. No free air. Musculoskeletal: Spondylitic changes in the lower lumbar spine with grade 1 anterolisthesis L3-4 and L4-5. IMPRESSION: 1. Mild inflammatory/edematous change in the lateral conal fascia adjacent to the cecum, without abscess. The appendix is not confidently identified. 2. Cholelithiasis. 3. Progressive coarse consolidation/atelectasis in the posterior and medial basal segments right lower lobe. Electronically Signed   By: Lucrezia Europe M.D.   On: 10/09/2018 11:41      IMPRESSION AND PLAN:   Acute appendicitis. Patient  will admitted under surgical service. Continue antibiotics with IV fluid support, n.p.o., pain control and Zofran.  Follow CBC. Follow-up with surgeon for surgery.  Shortness of breath with temporary hypoxia. Oxygen by nasal cannula, albuterol as needed.  Chest x-ray.  History of PE.  Hold Eliquis at this time for surgery. Hypertension.  Blood pressure is controlled, hold p.o. hypertension medication for surgery.  History of CLL.  Follow-up oncologist as outpatient.  All the records are reviewed and case discussed with ED provider. Management plans discussed with the patient, family and they are in agreement.  CODE STATUS: DNR.  TOTAL TIME TAKING CARE OF THIS PATIENT: 40 minutes.    Demetrios Loll M.D on 10/09/2018 at 2:55 PM  Between 7am to 6pm - Pager - [phone]  After 6pm go to [email or web address] - [employer] [facility]  Office  [phone]  CC: Primary care physician; Kirk Ruths, MD   Note: This dictation was prepared with Dragon dictation along with smaller phrase technology. Any transcriptional errors that result from this process are unin

## 2018-10-09 NOTE — ED Triage Notes (Signed)
Pt to ER via POV with c/o right "side" and abdominal pain starting around 2:30 AM.  Pt denies n/v/d or urinary symptoms.  Pt states no hx of similar pain.  Pt leans over in pain during exam.

## 2018-10-09 NOTE — H&P (View-Only) (Signed)
SURGERY FOLLOW UP NOTE  Patient re evaluated and found with improvement of pain. Still with significant pain but improving with current pain medication and antibiotic therapy.   Today's Vitals   10/09/18 1300 10/09/18 1400 10/09/18 1430 10/09/18 1550  BP: 124/66 (!) 123/59 120/70 140/61  Pulse: 65 62 63 65  Resp:  (!) 26 (!) 24 20  Temp:    100 F (37.8 C)  TempSrc:    Oral  SpO2: (!) 87% 96% 97% 97%  Weight:      Height:      PainSc:       Body mass index is 44.76 kg/m.  General: alert, oriented x3 Abdomen: soft and depressible, tender to palpation on the right lower quadrant. Non distended. No guarding.   Assessment and plan:  Number evaluated and found with improved pain.  Even though the pain has improved still significant.  Due to the recent dose of full dose of anticoagulation this morning, we will continue with pain management and antibiotic therapy and try to wait 24 hours to proceed with surgical management.  Patient was oriented again about the possible need of diagnostic laparoscopy with possible appendectomy if the pain does not improve significantly in the next 24 hours.  We had a long talk about the risk of surgery in her case with higher rates of bleeding.  Also rates of DVT and pulmonary embolism due to her previous history.  Patient understand and agreed with current plan.  This follow-up encounter was more than 30 minutes most of the time discussing with patient and orienting her about her condition, the plan and the risks.  Herbert Pun, MD

## 2018-10-09 NOTE — Progress Notes (Signed)
Advanced Care Plan.  Purpose of Encounter: CODE STATUS. Parties in Attendance: The patient and me. Patient's Decisional Capacity: Yes. Medical Story: Jamie Phillips  is a 78 y.o. female with a known history of anxiety, arthritis, back pain, CLL, CKD, PE, hypertension, hyperlipidemia and sleep apnea etc. The patient is being admitted for appendicitis.  Discussed with patient about patient current condition, prognosis and CODE STATUS.  The patient does not want to be resuscitated or intubated if she has cardiopulmonary arrest. Plan:  Code Status: DNR Time spent discussing advance care planning: 17 minutes.

## 2018-10-09 NOTE — Progress Notes (Signed)
SURGERY FOLLOW UP NOTE  Patient re evaluated and found with improvement of pain. Still with significant pain but improving with current pain medication and antibiotic therapy.   Today's Vitals   10/09/18 1300 10/09/18 1400 10/09/18 1430 10/09/18 1550  BP: 124/66 (!) 123/59 120/70 140/61  Pulse: 65 62 63 65  Resp:  (!) 26 (!) 24 20  Temp:    100 F (37.8 C)  TempSrc:    Oral  SpO2: (!) 87% 96% 97% 97%  Weight:      Height:      PainSc:       Body mass index is 44.76 kg/m.  General: alert, oriented x3 Abdomen: soft and depressible, tender to palpation on the right lower quadrant. Non distended. No guarding.   Assessment and plan:  Number evaluated and found with improved pain.  Even though the pain has improved still significant.  Due to the recent dose of full dose of anticoagulation this morning, we will continue with pain management and antibiotic therapy and try to wait 24 hours to proceed with surgical management.  Patient was oriented again about the possible need of diagnostic laparoscopy with possible appendectomy if the pain does not improve significantly in the next 24 hours.  We had a long talk about the risk of surgery in her case with higher rates of bleeding.  Also rates of DVT and pulmonary embolism due to her previous history.  Patient understand and agreed with current plan.  This follow-up encounter was more than 30 minutes most of the time discussing with patient and orienting her about her condition, the plan and the risks.  Herbert Pun, MD

## 2018-10-09 NOTE — ED Provider Notes (Signed)
[facility] Emergency Department Provider Note  Time seen: 11:15 AM  I have reviewed the triage vital signs and the nursing notes.   HISTORY  Chief Complaint Abdominal Pain   HPI Jamie Phillips is a 78 y.o. female with a past medical history of anxiety, CLL, gastric reflux, hypertension, hyperlipidemia, presents to the emergency department for right-sided abdominal pain.  According to the patient early this morning she developed right-sided abdominal pain, moderate to severe sharp pain in the right mid abdomen.  Patient denies any dysuria or hematuria.  States nausea but denies any vomiting or diarrhea.  No fever, cough congestion or shortness of breath.  No chest pain.   Past Medical History:  Diagnosis Date  . Anginal pain ([place])    atypical chest pain  . Anxiety   . Arthritis   . Back pain   . Cancer ([place])    cll  . Chronic kidney disease   . CLL (chronic lymphocytic leukemia) ([place])   . Depression   . GERD (gastroesophageal reflux disease)   . Headache   . Hemorrhoids   . Hypercholesteremia   . Hyperlipidemia   . Hypertension   . 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 Macular degeneration   . MGUS (monoclonal gammopathy of unknown significance)   . Obesity   . Pulmonary embolism ([place])   . PVD (peripheral vascular disease) ([place])   . Sleep apnea     Patient Active Problem List   Diagnosis Date Noted  . Calculus of gallbladder without cholecystitis without obstruction 08/27/2016  . 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 History of DVT in adulthood 03/04/2016  . DJD (degenerative joint disease) 03/04/2016  . Pain in limb 03/04/2016  . Venous insufficiency 03/04/2016  . CLL (chronic lymphocytic leukemia) ([place]) 11/21/2015  . Acute respiratory failure ([place]) 08/14/2015  . Acute bronchitis 05/11/2015  . Essential hypertension 05/04/2015  . Hyperlipidemia 05/04/2015  . Pain in the chest 05/04/2015  . SOB (shortness of  breath) 05/04/2015    Past Surgical History:  Procedure Laterality Date  . BREAST BIOPSY Left 08/19/04   lt bx/clip-neg  . COLONOSCOPY    . COLONOSCOPY WITH PROPOFOL N/A 03/10/2017   Procedure: COLONOSCOPY WITH PROPOFOL;  Surgeon: [name], MD;  Location: [facility] ENDOSCOPY;  Service: Endoscopy;  Laterality: N/A;  . IVC FILTER INSERTION N/A 06/11/2016   Procedure: IVC Filter Insertion;  Surgeon: Katha Cabal, MD;  Location: [place] [facility] LAB;  Service: Cardiovascular;  Laterality: N/A;  . IVC FILTER REMOVAL N/A 05/06/2017   Procedure: IVC FILTER REMOVAL;  Surgeon: Katha Cabal, MD;  Location: [place] [facility];  Service: Cardiovascular;  Laterality: N/A;  . JOINT REPLACEMENT Left 06/17/2016  . neck fusion    . PERIPHERAL VASCULAR CATHETERIZATION N/A 08/15/2015   Procedure: pulmonary angiogram with lysis;  Surgeon: Katha Cabal, MD;  Location: [place] [facility];  Service: Cardiovascular;  Laterality: N/A;  . WRIST SURGERY Left 2000    Prior to Admission medications   Medication Sig Start Date End Date Taking? Authorizing Provider  acetaminophen (TYLENOL) 650 MG CR tablet Take 650-1,300 mg by mouth every 8 (eight) hours as needed for pain. TYLENOL ARTHRITIS    [provider]  albuterol (PROVENTIL HFA;VENTOLIN HFA) 108 (90 Base) MCG/ACT inhaler Inhale 2 puffs into the lungs every 6 (six) hours as needed for wheezing or shortness of breath.    [provider]  amoxicillin (AMOXIL) 500 MG capsule  Before Dental Appt. 04/23/18   [provider]  ELIQUIS 5 MG TABS tablet TAKE 1 TABLET TWICE DAILY 01/03/16   Cammie Sickle, MD  escitalopram (LEXAPRO) 10 MG tablet Take 10 mg by mouth daily.    [provider]  gabapentin (NEURONTIN) 300 MG capsule Take 300 mg by mouth 3 (three) times daily.     [provider]  losartan-hydrochlorothiazide (HYZAAR) 100-12.5 MG tablet Take 1 tablet by mouth daily. Reported on 08/22/2015     [provider]  Multiple Vitamins-Minerals (PRESERVISION AREDS 2 PO) Take 1 capsule by mouth 2 (two) times daily.     [provider]  pantoprazole (PROTONIX) 40 MG tablet Take 40 mg by mouth daily.    [provider]  pravastatin (PRAVACHOL) 40 MG tablet Take 40 mg by mouth daily.     [provider]  propranolol (INDERAL) 20 MG tablet Take 20 mg by mouth 2 (two) times daily. 10/11/16   [provider]  tiZANidine (ZANAFLEX) 4 MG tablet Take 4 mg by mouth every 6 (six) hours as needed.  07/07/18   [provider]  traMADol (ULTRAM) 50 MG tablet Take 50 mg by mouth every 6 (six) hours as needed.  07/07/18   [provider]    Allergies  Allergen Reactions  . Oxycodone Other (See Comments)    Reaction:  Hallucinations  . Morphine Other (See Comments)    Reaction:  Hallucinations    Family History  Problem Relation Age of Onset  . Heart failure Father   . Congestive Heart Failure Father   . Pulmonary embolism Mother   . Breast cancer Neg Hx     Social History Social History   Tobacco Use  . Smoking status: Never Smoker  . Smokeless tobacco: Never Used  Substance Use Topics  . Alcohol use: No    Alcohol/week: 0.0 standard drinks  . Drug use: No    Review of Systems Constitutional: Negative for fever. Cardiovascular: Negative for chest pain. Respiratory: Negative for shortness of breath. Gastrointestinal: Sharp right mid abdominal pain.  Positive nausea but negative for vomiting or diarrhea Genitourinary: Negative for urinary compaints Musculoskeletal: Negative for musculoskeletal complaints Skin: Negative for skin complaints  Neurological: Negative for headache All other ROS negative  ____________________________________________   PHYSICAL EXAM:  VITAL SIGNS: ED Triage Vitals  Enc Vitals Group     BP 10/09/18 0947 134/70     Pulse Rate 10/09/18 0947 71     Resp 10/09/18 0947 18     Temp 10/09/18 0947  98.4 F (36.9 C)     Temp src --      SpO2 10/09/18 0947 96 %     Weight 10/09/18 0948 269 lb (122 kg)     Height 10/09/18 0948 5\' 5"  (1.651 m)     Head Circumference --      Peak Flow --      Pain Score 10/09/18 0947 10     Pain Loc --      Pain Edu? --      Excl. in [place]? --     Constitutional: Alert and oriented. Well appearing and in no distress. Eyes: Normal exam ENT      Head: Normocephalic and atraumatic.      Mouth/Throat: Mucous membranes are moist. Cardiovascular: Normal rate, regular rhythm.  Respiratory: Normal respiratory effort without tachypnea nor retractions. Breath sounds are clear Gastrointestinal: Soft, moderate right lower and right mid abdominal tenderness, moderate right flank tenderness/CVA  tenderness.  No left-sided tenderness.  No rebound or guarding. Musculoskeletal: Nontender with normal range of motion in all extremities Neurologic:  Normal speech and language. No gross focal neurologic deficits  Skin:  Skin is warm, dry and intact.  Psychiatric: Mood and affect are normal.   ____________________________________________    EKG  EKG viewed and interpreted by myself shows a normal sinus rhythm at 63 bpm with a narrow QRS, left axis deviation, largely normal intervals with nonspecific ST changes.  ____________________________________________    RADIOLOGY  IMPRESSION:  1. Mild inflammatory/edematous change in the lateral conal fascia  adjacent to the cecum, without abscess. The appendix is not  confidently identified.  2. Cholelithiasis.  3. Progressive coarse consolidation/atelectasis in the posterior and  medial basal segments right lower lobe.   ____________________________________________   INITIAL IMPRESSION / ASSESSMENT AND PLAN / ED COURSE  Pertinent labs & imaging results that were available during my care of the patient were reviewed by me and considered in my medical decision making (see chart for details).   Patient presents to  the emergency department for moderate right mid abdominal pain.  Patient does appear somewhat uncomfortable at this time.  We will dose Toradol, Zofran and fluids.  Patient's lab work is largely nonrevealing besides leukocytosis however patient has a history of CLL and this was largely unchanged.  Differential this time would include ureterolithiasis, pyelonephritis, UTI, appendicitis, cholecystitis, colitis or diverticulitis.  We will obtain a CT scan to further evaluate.  Patient agreeable to plan of care.  CT shows inflammatory changes around the cecum.  Cannot identify the appendix.  Given the patient's significant tenderness to this area not being able to identify the appendix on CT I have consulted Dr. Peyton Najjar of general surgery who will see the patient in consultation.  Patient has been seen by general surgery who will be admitting to their service for continued monitoring and treatment.  Jamie Phillips was evaluated in Emergency Department on 10/09/2018 for the symptoms described in the history of present illness. She was evaluated in the context of the global COVID-19 pandemic, which necessitated consideration that the patient might be at risk for infection with the SARS-CoV-2 virus that causes COVID-19. Institutional protocols and algorithms that pertain to the evaluation of patients at risk for COVID-19 are in a state of rapid change based on information released by regulatory bodies including the CDC and federal and state organizations. These policies and algorithms were followed during the patient's care in the ED.  ____________________________________________   FINAL CLINICAL IMPRESSION(S) / ED DIAGNOSES  Right flank pain   Harvest Dark, MD 10/09/18 1326

## 2018-10-10 ENCOUNTER — Encounter
Admission: EM | Disposition: A | Discharge status: Home / Self Care | Payer: Self-pay | Source: Home / Self Care | Attending: General Surgery

## 2018-10-10 ENCOUNTER — Encounter: Payer: Self-pay | Admitting: Anesthesiology

## 2018-10-10 ENCOUNTER — Inpatient Hospital Stay: Payer: Medicare HMO | Admitting: Registered Nurse

## 2018-10-10 HISTORY — PX: PARTIAL COLECTOMY: SHX5273

## 2018-10-10 HISTORY — PX: LAPAROSCOPY: SHX197

## 2018-10-10 HISTORY — PX: LAPAROSCOPIC APPENDECTOMY: SHX408

## 2018-10-10 LAB — BASIC METABOLIC PANEL
Anion gap: 7 (ref 5–15)
BUN: 20 mg/dL (ref 8–23)
CO2: 29 mmol/L (ref 22–32)
Calcium: 8 mg/dL — ABNORMAL LOW (ref 8.9–10.3)
Chloride: 108 mmol/L (ref 98–111)
Creatinine, Ser: 1.27 mg/dL — ABNORMAL HIGH (ref 0.44–1.00)
GFR calc Af Amer: 47 mL/min — ABNORMAL LOW (ref >60)
GFR calc non Af Amer: 41 mL/min — ABNORMAL LOW (ref >60)
Glucose, Bld: 116 mg/dL — ABNORMAL HIGH (ref 70–99)
Potassium: 3.7 mmol/L (ref 3.5–5.1)
Sodium: 144 mmol/L (ref 135–145)

## 2018-10-10 LAB — CBC
HCT: 34.6 % — ABNORMAL LOW (ref 36.0–46.0)
Hemoglobin: 10.8 g/dL — ABNORMAL LOW (ref 12.0–15.0)
MCH: 30.6 pg (ref 26.0–34.0)
MCHC: 31.2 g/dL (ref 30.0–36.0)
MCV: 98 fL (ref 80.0–100.0)
Platelets: 225 10*3/uL (ref 150–400)
RBC: 3.53 MIL/uL — ABNORMAL LOW (ref 3.87–5.11)
RDW: 15.5 % (ref 11.5–15.5)
WBC: 18.3 10*3/uL — ABNORMAL HIGH (ref 4.0–10.5)
nRBC: 0 % (ref 0.0–0.2)

## 2018-10-10 LAB — SURGICAL PCR SCREEN
MRSA, PCR: NEGATIVE
Staphylococcus aureus: NEGATIVE

## 2018-10-10 SURGERY — LAPAROSCOPY, DIAGNOSTIC
Anesthesia: General | Laterality: Right

## 2018-10-10 MED ORDER — BUPIVACAINE LIPOSOME 1.3 % IJ SUSP
INTRAMUSCULAR | Status: AC
  Filled 2018-10-10: qty 20

## 2018-10-10 MED ORDER — PROPOFOL 10 MG/ML IV BOLUS
INTRAVENOUS | Status: AC
  Filled 2018-10-10: qty 20

## 2018-10-10 MED ORDER — FENTANYL CITRATE (PF) 100 MCG/2ML IJ SOLN
25.0000 ug | INTRAMUSCULAR | Status: DC | PRN
  Administered 2018-10-10: 25 ug via INTRAVENOUS

## 2018-10-10 MED ORDER — PROPOFOL 10 MG/ML IV BOLUS
INTRAVENOUS | Status: DC | PRN
  Administered 2018-10-10: 120 mg via INTRAVENOUS

## 2018-10-10 MED ORDER — FENTANYL CITRATE (PF) 100 MCG/2ML IJ SOLN
INTRAMUSCULAR | Status: AC
  Filled 2018-10-10: qty 2

## 2018-10-10 MED ORDER — BUPIVACAINE-EPINEPHRINE (PF) 0.5% -1:200000 IJ SOLN
INTRAMUSCULAR | Status: AC
  Filled 2018-10-10: qty 30

## 2018-10-10 MED ORDER — SEVOFLURANE IN SOLN
RESPIRATORY_TRACT | Status: AC
  Filled 2018-10-10: qty 250

## 2018-10-10 MED ORDER — HYDRALAZINE HCL 20 MG/ML IJ SOLN: 5.0000 mg | INTRAMUSCULAR | Status: DC | PRN

## 2018-10-10 MED ORDER — LACTATED RINGERS IV SOLN
INTRAVENOUS | Status: DC | PRN
  Administered 2018-10-10: 10:00:00 via INTRAVENOUS

## 2018-10-10 MED ORDER — LIDOCAINE HCL (CARDIAC) PF 100 MG/5ML IV SOSY
PREFILLED_SYRINGE | INTRAVENOUS | Status: DC | PRN
  Administered 2018-10-10: 100 mg via INTRAVENOUS

## 2018-10-10 MED ORDER — BUPIVACAINE-EPINEPHRINE 0.5% -1:200000 IJ SOLN
INTRAMUSCULAR | Status: DC | PRN
  Administered 2018-10-10: 19 mL
  Administered 2018-10-10: 11 mL

## 2018-10-10 MED ORDER — FENTANYL CITRATE (PF) 100 MCG/2ML IJ SOLN
INTRAMUSCULAR | Status: DC | PRN
  Administered 2018-10-10 (×2): 50 ug via INTRAVENOUS

## 2018-10-10 MED ORDER — EPHEDRINE SULFATE 50 MG/ML IJ SOLN
INTRAMUSCULAR | Status: DC | PRN
  Administered 2018-10-10 (×3): 10 mg via INTRAVENOUS
  Administered 2018-10-10 (×4): 5 mg via INTRAVENOUS

## 2018-10-10 MED ORDER — MENTHOL 3 MG MT LOZG
1.0000 | LOZENGE | OROMUCOSAL | Status: DC | PRN
  Administered 2018-10-10 – 2018-10-11 (×2): 3 mg via ORAL
  Filled 2018-10-10: qty 9

## 2018-10-10 MED ORDER — ROCURONIUM BROMIDE 100 MG/10ML IV SOLN
INTRAVENOUS | Status: DC | PRN
  Administered 2018-10-10: 20 mg via INTRAVENOUS
  Administered 2018-10-10: 30 mg via INTRAVENOUS
  Administered 2018-10-10: 20 mg via INTRAVENOUS

## 2018-10-10 MED ORDER — KETOROLAC TROMETHAMINE 15 MG/ML IJ SOLN
15.0000 mg | Freq: Four times a day (QID) | INTRAMUSCULAR | Status: DC | PRN
  Administered 2018-10-10: 15 mg via INTRAVENOUS
  Filled 2018-10-10: qty 1

## 2018-10-10 MED ORDER — CEFAZOLIN SODIUM-DEXTROSE 2-4 GM/100ML-% IV SOLN
2.0000 g | Freq: Once | INTRAVENOUS | Status: AC
  Administered 2018-10-10: 10:00:00 2 g via INTRAVENOUS
  Filled 2018-10-10: qty 100

## 2018-10-10 MED ORDER — ONDANSETRON HCL 4 MG/2ML IJ SOLN: 4.0000 mg | Freq: Once | INTRAMUSCULAR | Status: DC | PRN

## 2018-10-10 MED ORDER — SUGAMMADEX SODIUM 500 MG/5ML IV SOLN
INTRAVENOUS | Status: DC | PRN
  Administered 2018-10-10: 200 mg via INTRAVENOUS

## 2018-10-10 MED ORDER — BUPIVACAINE LIPOSOME 1.3 % IJ SUSP
INTRAMUSCULAR | Status: DC | PRN
  Administered 2018-10-10: 20 mL

## 2018-10-10 MED ORDER — ONDANSETRON HCL 4 MG/2ML IJ SOLN
INTRAMUSCULAR | Status: DC | PRN
  Administered 2018-10-10: 4 mg via INTRAVENOUS

## 2018-10-10 MED ORDER — SUCCINYLCHOLINE CHLORIDE 20 MG/ML IJ SOLN
INTRAMUSCULAR | Status: DC | PRN
  Administered 2018-10-10: 100 mg via INTRAVENOUS

## 2018-10-10 SURGICAL SUPPLY — 66 items
"PENCIL ELECTRO HAND CTR " (MISCELLANEOUS) ×2 IMPLANT
ADH SKN CLS APL DERMABOND .7 (GAUZE/BANDAGES/DRESSINGS) ×2
APL PRP STRL LF DISP 70% ISPRP (MISCELLANEOUS) ×2
APPLIER CLIP LOGIC TI 5 (MISCELLANEOUS) IMPLANT
APR CLP MED LRG 33X5 (MISCELLANEOUS)
BLADE SURG 15 STRL LF DISP TIS (BLADE) ×2 IMPLANT
BLADE SURG 15 STRL SS (BLADE) ×4
BLADE SURG SZ11 CARB STEEL (BLADE) ×4 IMPLANT
BULB RESERV EVAC DRAIN JP 100C (MISCELLANEOUS) ×2 IMPLANT
CANISTER SUCT 1200ML W/VALVE (MISCELLANEOUS) ×4 IMPLANT
CANNULA DILATOR 10 W/SLV (CANNULA) ×3 IMPLANT
CANNULA DILATOR 10MM W/SLV (CANNULA) ×1
CHLORAPREP W/TINT 26 (MISCELLANEOUS) ×4 IMPLANT
COVER WAND RF STERILE (DRAPES) ×4 IMPLANT
CUTTER FLEX LINEAR 45M (STAPLE) ×2 IMPLANT
DERMABOND ADVANCED (GAUZE/BANDAGES/DRESSINGS) ×2
DERMABOND ADVANCED .7 DNX12 (GAUZE/BANDAGES/DRESSINGS) ×2 IMPLANT
DRAIN CHANNEL JP 19F (MISCELLANEOUS) ×2 IMPLANT
DRSG AQUACEL AG ADV 3.5X 4 (GAUZE/BANDAGES/DRESSINGS) ×4 IMPLANT
ELECT REM PT RETURN 9FT ADLT (ELECTROSURGICAL) ×4
ELECTRODE REM PT RTRN 9FT ADLT (ELECTROSURGICAL) ×2 IMPLANT
GLOVE BIO SURGEON STRL SZ 6.5 (GLOVE) ×5 IMPLANT
GLOVE BIO SURGEONS STRL SZ 6.5 (GLOVE) ×3
GLOVE INDICATOR 6.5 STRL GRN (GLOVE) ×8 IMPLANT
GOWN STRL REUS W/ TWL LRG LVL3 (GOWN DISPOSABLE) ×6 IMPLANT
GOWN STRL REUS W/TWL LRG LVL3 (GOWN DISPOSABLE) ×8
GRASPER SUT TROCAR 14GX15 (MISCELLANEOUS) ×2 IMPLANT
HANDLE YANKAUER SUCT BULB TIP (MISCELLANEOUS) ×4 IMPLANT
IRRIGATION STRYKERFLOW (MISCELLANEOUS) ×2 IMPLANT
IRRIGATOR STRYKERFLOW (MISCELLANEOUS) ×4
IV NS 1000ML (IV SOLUTION) ×4
IV NS 1000ML BAXH (IV SOLUTION) ×2 IMPLANT
KIT TURNOVER KIT A (KITS) ×4 IMPLANT
LIGASURE IMPACT 36 18CM CVD LR (INSTRUMENTS) ×2 IMPLANT
LIGASURE LAP MARYLAND 5MM 37CM (ELECTROSURGICAL) IMPLANT
NDL INSUFF ACCESS 14 VERSASTEP (NEEDLE) ×4 IMPLANT
NEEDLE HYPO 22GX1.5 SAFETY (NEEDLE) ×4 IMPLANT
NEEDLE VERESS 14GA 120MM (NEEDLE) ×4 IMPLANT
NS IRRIG 500ML POUR BTL (IV SOLUTION) ×4 IMPLANT
PACK LAP CHOLECYSTECTOMY (MISCELLANEOUS) ×4 IMPLANT
PENCIL ELECTRO HAND CTR (MISCELLANEOUS) ×4 IMPLANT
POUCH ENDO CATCH 10MM SPEC (MISCELLANEOUS) ×4 IMPLANT
RELOAD 45 VASCULAR/THIN (ENDOMECHANICALS) IMPLANT
RELOAD PROXIMATE 75MM BLUE (ENDOMECHANICALS) ×16 IMPLANT
RELOAD STAPLE 45 2.5 WHT GRN (ENDOMECHANICALS) IMPLANT
RELOAD STAPLE 45 3.5 BLU ETS (ENDOMECHANICALS) ×2 IMPLANT
RELOAD STAPLE 75 3.8 BLU REG (ENDOMECHANICALS) IMPLANT
RELOAD STAPLE TA45 3.5 REG BLU (ENDOMECHANICALS) IMPLANT
SCISSORS METZENBAUM CVD 33 (INSTRUMENTS) ×4 IMPLANT
SET TUBE SMOKE EVAC HIGH FLOW (TUBING) ×4 IMPLANT
SLEEVE ENDOPATH XCEL 5M (ENDOMECHANICALS) ×4 IMPLANT
SPONGE GAUZE 2X2 8PLY STER LF (GAUZE/BANDAGES/DRESSINGS) ×1
SPONGE GAUZE 2X2 8PLY STRL LF (GAUZE/BANDAGES/DRESSINGS) ×3 IMPLANT
SPONGE LAP 18X18 RF (DISPOSABLE) ×4 IMPLANT
STAPLER PROXIMATE 75MM BLUE (STAPLE) ×2 IMPLANT
SUT MNCRL AB 4-0 PS2 18 (SUTURE) ×4 IMPLANT
SUT PDS AB 0 CT1 27 (SUTURE) ×4 IMPLANT
SUT SILK 3-0 (SUTURE) ×4 IMPLANT
SUT VICRYL 3-0 CR8 SH (SUTURE) ×2 IMPLANT
SUT VICRYL PLUS ABS 0 54 (SUTURE) ×4 IMPLANT
SYS LAPSCP GELPORT 120MM (MISCELLANEOUS) ×4
SYSTEM LAPSCP GELPORT 120MM (MISCELLANEOUS) IMPLANT
TRAY FOLEY MTR SLVR 16FR STAT (SET/KITS/TRAYS/PACK) ×4 IMPLANT
TROCAR XCEL 12X100 BLDLESS (ENDOMECHANICALS) ×4 IMPLANT
TROCAR XCEL BLUNT TIP 100MML (ENDOMECHANICALS) ×8 IMPLANT
TROCAR XCEL NON-BLD 5MMX100MML (ENDOMECHANICALS) ×4 IMPLANT

## 2018-10-10 NOTE — Progress Notes (Signed)
Patient sleeping soundly, woke to fire alarm going off.  States she is hurting, will give pain medications as ordered.  Patient comfortable, eyes closed and no acute distress.  Patient was complaining of pain on arrival prior to surgery.

## 2018-10-10 NOTE — Anesthesia Post-op Follow-up Note (Signed)
Anesthesia QCDR form completed.        

## 2018-10-10 NOTE — Anesthesia Procedure Notes (Signed)
Procedure Name: Intubation Performed by: Lesle Reek, CRNA Pre-anesthesia Checklist: Patient identified, Emergency Drugs available, Suction available, Patient being monitored and Timeout performed Patient Re-evaluated:Patient Re-evaluated prior to induction Oxygen Delivery Method: Circle system utilized Preoxygenation: Pre-oxygenation with 100% oxygen Induction Type: IV induction and Rapid sequence Laryngoscope Size: Mac and 4 Grade View: Grade I Tube type: Oral Tube size: 7.0 mm Number of attempts: 1 Airway Equipment and Method: Stylet Placement Confirmation: ETT inserted through vocal cords under direct vision,  positive ETCO2,  CO2 detector and breath sounds checked- equal and bilateral Secured at: 20 cm Tube secured with: Tape

## 2018-10-10 NOTE — Progress Notes (Signed)
15 minute call to floor. 

## 2018-10-10 NOTE — Plan of Care (Signed)
Patient went to OR today and had a colon resection.  Surgery went well.  Incisions are C/D/I.  Patient has still been in some pain but resting on and off.  She has not been awake long enough to drink anything.  Foley in place with adequate output.  I spoke to the patient's son after surgery and gave him an update.  No significant changes.

## 2018-10-10 NOTE — Transfer of Care (Signed)
Immediate Anesthesia Transfer of Care Note  Patient: Jamie Phillips  Procedure(s) Performed: LAPAROSCOPY DIAGNOSTIC ATTEMPTED CONVERTED TO OPEN (N/A ) APPENDECTOMY LAPAROSCOPIC ATTEMPTED CONVERTED TO OPEN (N/A ) PARTIAL COLECTOMY (Right )  Patient Location: PACU  Anesthesia Type:General  Level of Consciousness: awake  Airway & Oxygen Therapy: Patient Spontanous Breathing and Patient connected to face mask oxygen  Post-op Assessment: Report given to RN  Post vital signs: Reviewed and stable  Last Vitals:  Vitals Value Taken Time  BP 147/125 10/10/18 1255  Temp    Pulse    Resp 16 10/10/18 1254  SpO2    Vitals shown include unvalidated device data.  Last Pain:  Vitals:   10/10/18 0921  TempSrc: Oral  PainSc:          Complications: No apparent anesthesia complications

## 2018-10-10 NOTE — Op Note (Signed)
Preoperative diagnosis: Suspected appendicitis  Postoperative diagnosis: Perforation of the Cecum  Procedure: Diagnostic laparoscopy Laparoscopic (hand assisted) right hemicolectomy with primary anastomosis.   Anesthesia: GETA  Surgeon: Dr. Windell Moment, MD  Wound Classification: Dirty  Indications: Patient is a 78 y.o. female with symptoms right lower quadrant pain that did not improve with antibiotic therapy.  CT scan shows a localized area of stranding.  Nonvisualization of the appendix patient for acute appendicitis.  Diagnostic laparoscopy was elected and was found to have a cecal perforation, procedure was converted to laparoscopic right colectomy.  Findings: 1.  Localized area of the cecum with perforation 2. Tension free anastomosis 3. Adequate gross circulation on anastomosis 4.  Appendix was normal 5.  No other intra-abdominal pathology identified. 6.  Adequate hemostasis  Details of operation: The patient was brought to the operating room and placed on the operating table in the supine position. The abdomen was clipped, prepped with 2% chlorhexidine solution, and draped in the standard fashion. A time-out was completed verifying correct patient, procedure, site, positioning, and implant(s) and/or special equipment prior to beginning this procedure.  Using a Veress needle technique the abdomen was insufflated up to 15 mmHg. A 11 mm trocar was inserted supraumbilically.  No injuries were identified.  Another two 5 mm trochars were inserted in the suprapubic area and in the left lower quadrant.  With the use of the grasper the right colon was inspected and a focal area of ischemia was identified with necrotic tissue in the cecum.  At this point it was decided to proceed with a right colectomy. A vertical 5 cm periumbilical incision was used to access the peritoneal cavity and a Gelport was placed.  The patient was then positioned in 30 reverse Trendelenburg position with the  patient's right side up to allow for small bowel to drop clear of the operative field. With a hand assisted technique, the cecum was grasped with the and lifted up to tent the mesentery.  Dissection was carried down through the line of Toldt from the lateral aspect of the colon from the cecum to the the hepatic flexure until the duodenum was identified. The Duodenum and its attachments were swept downward. The C loop of the duodenum as well as a head of the pancreas were fully visualized with dissected and preserved. This dissection was continued in the avascular plane until the liver or gallbladder were encountered.  The appendix was identified and no pathology was seen.  The terminal ileum was mobilized from its attachment to the right lower quadrant. The Omentum attached to resected portions of colon was divided and resected with the specimen.  A grasper was placed on the ascending colon and then delivered and extracted through the Gelport incision. The small bowel and colon were then divided extracorporeally using a GIA device. The mesenteric dissection was completed from the small bowel to the colonic side with harmonic device and the specimen resection was thus completed. The specimen was removed. The two ends of the ileum and transverse colon were then aligned, ensuring that the mesentery was not twisted, and a stapled end-to-end anastomosis was created in the usual manner. The mesenteric defect was then closed with Vicryl 3-0 sutures and the bowel reintroduced into the peritoneal cavity.  The abdominal cavity was again insufflated for final inspection.  No bleeding was identified.  Anastomosis was seen relaxed on the right upper quadrant. The fascia was then closed with PDS 0. The skin was closed with 4-0 Monocryl sutures in a  running fashion and a dry sterile dressing was applied. The sponge and instrument count was correct, blood loss was minimal, and there were no complications. The patient tolerated  the procedure well and was transferred to the post-anesthesia care unit in stable condition.   Specimen: Right Colon and ileum.   Complications: None  Estimated Blood Loss: 100 mL

## 2018-10-10 NOTE — Progress Notes (Signed)
[place] at [place] NAME: Jamie Phillips    MR#:  [id number]  DATE OF BIRTH:  03-25-1941  SUBJECTIVE:   Patient states she is having a lot of pain after her surgery.  She denies any nausea, vomiting, fevers, chills.  REVIEW OF SYSTEMS:  Review of Systems  Constitutional: Negative for chills and fever.  HENT: Negative for congestion and sore throat.   Eyes: Negative for blurred vision and double vision.  Respiratory: Negative for cough and shortness of breath.   Cardiovascular: Negative for palpitations.  Gastrointestinal: Positive for abdominal pain. Negative for nausea and vomiting.  Genitourinary: Negative for dysuria and urgency.  Musculoskeletal: Negative for back pain and neck pain.  Neurological: Negative for dizziness and headaches.  Psychiatric/Behavioral: Negative for depression. The patient is not nervous/anxious.     DRUG ALLERGIES:   Allergies  Allergen Reactions  . Oxycodone Other (See Comments)    Reaction:  Hallucinations  . Morphine Other (See Comments)    Reaction:  Hallucinations   VITALS:  Blood pressure 126/67, pulse (!) 55, temperature 98 F (36.7 C), temperature source Oral, resp. rate 11, height 5\' 5"  (1.651 m), weight 122 kg, SpO2 91 %. PHYSICAL EXAMINATION:  Physical Exam  GENERAL:  Laying in the bed with no acute distress.  HEENT: Head atraumatic, normocephalic. Pupils equal, round, reactive to light and accommodation. No scleral icterus. Extraocular muscles intact. Oropharynx and nasopharynx clear.  NECK:  Supple, no jugular venous distention. No thyroid enlargement. LUNGS: Lungs are clear to auscultation bilaterally. No wheezes, crackles, rhonchi. No use of accessory muscles of respiration.  CARDIOVASCULAR: RRR, S1, S2 normal. No murmurs, rubs, or gallops.  ABDOMEN: Soft, nontender, nondistended. Bowel sounds present. +surgical incisions with dry dressings place. EXTREMITIES: No pedal edema,  cyanosis, or clubbing.  NEUROLOGIC: CN 2-12 intact, no focal deficits. +global weakness. Sensation intact throughout. Gait not checked.  PSYCHIATRIC: The patient is alert and oriented x 3.  SKIN: No obvious rash, lesion, or ulcer.  LABORATORY PANEL:  Female CBC Recent Labs  Lab 10/10/18 0728  WBC 18.3*  HGB 10.8*  HCT 34.6*  PLT 225   ------------------------------------------------------------------------------------------------------------------ Chemistries  Recent Labs  Lab 10/09/18 1003 10/10/18 0728  NA 142 144  K 3.6 3.7  CL 105 108  CO2 27 29  GLUCOSE 128* 116*  BUN 17 20  CREATININE 1.02* 1.27*  CALCIUM 8.7* 8.0*  AST 16  --   ALT 13  --   ALKPHOS 97  --   BILITOT 0.7  --    RADIOLOGY:  Dg Chest Port 1 View  Result Date: 10/09/2018 CLINICAL DATA:  Appendicitis, history of PE hypertension EXAM: PORTABLE CHEST 1 VIEW COMPARISON:  CT 09/07/2018, radiograph 11/03/2016 FINDINGS: Surgical hardware in the cervical spine. Mild cardiomegaly with aortic atherosclerosis. No acute airspace disease or effusion. No pneumothorax. IMPRESSION: No active disease.  Mild cardiomegaly. Electronically Signed   By: Donavan Foil M.D.   On: 10/09/2018 17:13   ASSESSMENT AND PLAN:   Perforation of cecum- initially thought to be acute appendicitis, but appendix was normal during surgery. -s/p laparoscopic right hemicolectomy with primary anastomosis 6/20 -Continue IV Zosyn -Pain control  Acute hypoxic respiratory failure-likely due to combination of above in addition to OSA.  Currently requiring 2 L O2.  Chest x-ray negative. -Wean O2 as able -CPAP qhs for OSA -Albuterol prn  History of PE- stable -Holding eliquis  Hypertension-BP well controlled -Holding home losartan-hydrochlorothiazide for now  History of  CLL-stable. -Follow-up with oncologist as outpatient.  All the records are reviewed and case discussed with Care Management/Social Worker. Management plans discussed  with the patient, family and they are in agreement.  CODE STATUS: DNR  TOTAL TIME TAKING CARE OF THIS PATIENT: 45 minutes.   More than 50% of the time was spent in counseling/coordination of care: YES  POSSIBLE D/C IN 3-4 DAYS, DEPENDING ON CLINICAL CONDITION.   Berna Spare Deyani Hegarty M.D on 10/10/2018 at 2:52 PM  Between 7am to 6pm - Pager [phone]  After 6pm go to [email or web address] - Technical brewer [facility]  Office  [phone]  CC: Primary care physician; Kirk Ruths, MD  Note: This dictation was prepared with Dragon dictation along with smaller phrase technology. Any transcriptional errors that result from this process are unintentional.

## 2018-10-10 NOTE — Anesthesia Preprocedure Evaluation (Signed)
Anesthesia Evaluation  Patient identified by MRN, date of birth, ID band Patient awake    Reviewed: Allergy & Precautions, NPO status , Patient's Chart, lab work & pertinent test results, reviewed documented beta blocker date and time   Airway Mallampati: II       Dental  (+) Teeth Intact   Pulmonary sleep apnea , COPD,  COPD inhaler,     + decreased breath sounds      Cardiovascular hypertension, Pt. on medications and Pt. on home beta blockers + angina + Peripheral Vascular Disease and + DOE   Rhythm:Regular Rate:Normal     Neuro/Psych  Headaches, PSYCHIATRIC DISORDERS Anxiety Depression    GI/Hepatic Neg liver ROS, GERD  Medicated,  Endo/Other  Morbid obesity  Renal/GU Renal InsufficiencyRenal disease     Musculoskeletal negative musculoskeletal ROS (+)   Abdominal (+) + obese,   Peds negative pediatric ROS (+)  Hematology negative hematology ROS (+)   Anesthesia Other Findings Past Medical History: No date: Anginal pain (HCC)     Comment:  atypical chest pain No date: Anxiety No date: Arthritis No date: Back pain No date: Cancer ([facility])     Comment:  cll No date: Chronic kidney disease No date: CLL (chronic lymphocytic leukemia) (HCC) No date: Depression No date: GERD (gastroesophageal reflux disease) No date: Headache No date: Hemorrhoids No date: Hypercholesteremia No date: Hyperlipidemia No date: Hypertension 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) No date: Macular degeneration 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) No date: Obesity No date: Pulmonary embolism (HCC) No date: PVD (peripheral vascular disease) (HCC) No date: Sleep apnea  Reproductive/Obstetrics                             Anesthesia Physical  Anesthesia Plan  ASA: III  Anesthesia Plan: General   Post-op Pain Management:    Induction: Intravenous  PONV  Risk Score and Plan: 0  Airway Management Planned: Oral ETT  Additional Equipment:   Intra-op Plan:   Post-operative Plan: Extubation in OR  Informed Consent: I have reviewed the patients History and Physical, chart, labs and discussed the procedure including the risks, benefits and alternatives for the proposed anesthesia with the patient or authorized representative who has indicated his/her understanding and acceptance.       Plan Discussed with: CRNA and Surgeon  Anesthesia Plan Comments:         Anesthesia Quick Evaluation

## 2018-10-10 NOTE — Progress Notes (Signed)
Pt remaining in pain after medication given. Primary nurse paged and spoke to Dr Sidney Ace. Orders received for Toradol 15 mg IV q 6 hr PRN. Primary nurse to continue to monitor

## 2018-10-10 NOTE — Interval H&P Note (Signed)
History and Physical Interval Note:  10/10/2018 9:37 AM  Jamie Phillips  has presented today for surgery, with the diagnosis of X.  The various methods of treatment have been discussed with the patient and family. After consideration of risks, benefits and other options for treatment, the patient has consented to  Procedure(s): LAPAROSCOPY DIAGNOSTIC (N/A) APPENDECTOMY LAPAROSCOPIC (N/A) as a surgical intervention.  The patient's history has been reviewed, patient examined, no change in status, stable for surgery.  I have reviewed the patient's chart and labs.  Questions were answered to the patient's satisfaction.  Pain has not improved, will proceed with diagnostic laparoscopy and possible appendectomy.    Herbert Pun

## 2018-10-11 ENCOUNTER — Encounter: Payer: Self-pay | Admitting: General Surgery

## 2018-10-11 LAB — BASIC METABOLIC PANEL
Anion gap: 7 (ref 5–15)
BUN: 16 mg/dL (ref 8–23)
CO2: 25 mmol/L (ref 22–32)
Calcium: 7.7 mg/dL — ABNORMAL LOW (ref 8.9–10.3)
Chloride: 107 mmol/L (ref 98–111)
Creatinine, Ser: 1.03 mg/dL — ABNORMAL HIGH (ref 0.44–1.00)
GFR calc Af Amer: >60 mL/min (ref >60)
GFR calc non Af Amer: 53 mL/min — ABNORMAL LOW (ref >60)
Glucose, Bld: 125 mg/dL — ABNORMAL HIGH (ref 70–99)
Potassium: 3.7 mmol/L (ref 3.5–5.1)
Sodium: 139 mmol/L (ref 135–145)

## 2018-10-11 LAB — CBC
HCT: 33.7 % — ABNORMAL LOW (ref 36.0–46.0)
Hemoglobin: 10.3 g/dL — ABNORMAL LOW (ref 12.0–15.0)
MCH: 30.4 pg (ref 26.0–34.0)
MCHC: 30.6 g/dL (ref 30.0–36.0)
MCV: 99.4 fL (ref 80.0–100.0)
Platelets: 225 10*3/uL (ref 150–400)
RBC: 3.39 MIL/uL — ABNORMAL LOW (ref 3.87–5.11)
RDW: 15.3 % (ref 11.5–15.5)
WBC: 18.4 10*3/uL — ABNORMAL HIGH (ref 4.0–10.5)
nRBC: 0 % (ref 0.0–0.2)

## 2018-10-11 MED ORDER — KETOROLAC TROMETHAMINE 30 MG/ML IJ SOLN
30.0000 mg | Freq: Three times a day (TID) | INTRAMUSCULAR | Status: AC
  Administered 2018-10-11 – 2018-10-13 (×6): 30 mg via INTRAVENOUS
  Filled 2018-10-11 (×6): qty 1

## 2018-10-11 MED ORDER — ENOXAPARIN SODIUM 40 MG/0.4ML ~~LOC~~ SOLN: 40.0000 mg | SUBCUTANEOUS | Status: DC

## 2018-10-11 MED ORDER — ENOXAPARIN SODIUM 40 MG/0.4ML ~~LOC~~ SOLN
40.0000 mg | Freq: Two times a day (BID) | SUBCUTANEOUS | Status: DC
  Administered 2018-10-11 – 2018-10-12 (×3): 40 mg via SUBCUTANEOUS
  Filled 2018-10-11 (×3): qty 0.4

## 2018-10-11 NOTE — Anesthesia Postprocedure Evaluation (Signed)
Anesthesia Post Note  Patient: Jamie Phillips  Procedure(s) Performed: LAPAROSCOPY DIAGNOSTIC ATTEMPTED CONVERTED TO OPEN (N/A ) APPENDECTOMY LAPAROSCOPIC ATTEMPTED CONVERTED TO OPEN (N/A ) PARTIAL COLECTOMY (Right )  Patient location during evaluation: PACU Anesthesia Type: General Level of consciousness: awake and alert and oriented Pain management: pain level controlled Vital Signs Assessment: post-procedure vital signs reviewed and stable Respiratory status: spontaneous breathing Cardiovascular status: blood pressure returned to baseline Anesthetic complications: no     Last Vitals:  Vitals:   10/11/18 0506 10/11/18 1008  BP:  (!) 111/55  Pulse:  73  Resp:    Temp: 36.7 C   SpO2:      Last Pain:  Vitals:   10/11/18 1000  TempSrc:   PainSc: 8                  Matison Nuccio

## 2018-10-11 NOTE — Progress Notes (Addendum)
[facility] Hospital Day(s): 2.   Post op day(s): 1 Day Post-Op.   Interval History: Patient seen and examined, no acute events or new complaints overnight. Patient reports feeling significant abdominal pain but slightly improved since yesterday.  He stated some nausea and when he tried to vomit but the pain restarted.  She has been able to sleep and rest.  There has been no vomiting.  Vital signs in last 24 hours: [min-max] current  Temp:  [97.9 F (36.6 C)-99 F (37.2 C)] 98.1 F (36.7 C) (06/21 0506) Pulse Rate:  [53-73] 73 (06/21 1008) Resp:  [11-18] 18 (06/21 0452) BP: (111-133)/(50-74) 111/55 (06/21 1008) SpO2:  [91 %-99 %] 92 % (06/21 0452)     Height: 5\' 5"  (165.1 cm) Weight: 122 kg BMI (Calculated): 44.76   Physical Exam:  Constitutional: alert, cooperative and no distress  Respiratory: breathing non-labored at rest  Cardiovascular: regular rate and sinus rhythm  Gastrointestinal: soft, tender on the right side, and non-distended  Labs:  CBC Latest Ref Rng & Units 10/11/2018 10/10/2018 10/09/2018  WBC 4.0 - 10.5 K/uL 18.4(H) 18.3(H) 17.2(H)  Hemoglobin 12.0 - 15.0 g/dL 10.3(L) 10.8(L) 12.5  Hematocrit 36.0 - 46.0 % 33.7(L) 34.6(L) 39.4  Platelets 150 - 400 K/uL 225 225 242   CMP Latest Ref Rng & Units 10/11/2018 10/10/2018 10/09/2018  Glucose 70 - 99 mg/dL 125(H) 116(H) 128(H)  BUN 8 - 23 mg/dL 16 20 17   Creatinine 0.44 - 1.00 mg/dL 1.03(H) 1.27(H) 1.02(H)  Sodium 135 - 145 mmol/L 139 144 142  Potassium 3.5 - 5.1 mmol/L 3.7 3.7 3.6  Chloride 98 - 111 mmol/L 107 108 105  CO2 22 - 32 mmol/L 25 29 27   Calcium 8.9 - 10.3 mg/dL 7.7(L) 8.0(L) 8.7(L)  Total Protein 6.5 - 8.1 g/dL - - 6.4(L)  Total Bilirubin 0.3 - 1.2 mg/dL - - 0.7  Alkaline Phos 38 - 126 U/L - - 97  AST 15 - 41 U/L - - 16  ALT 0 - 44 U/L - - 13    Imaging studies: No new pertinent imaging studies   Assessment/Plan:  78 y.o. 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 Patient today recovering slowly.  She had a cecal ischemia with a normal appendix.  Right colectomy was needed for treatment.  Surgery was uneventful with adequate hemostasis.  Patient still significantly tender on the right side of the abdomen.  I will improved pain medications regimen for better pain control.  Patient currently tolerating clear liquids and will keep her on the diet until the pain improves and bowel function restart.  The hemoglobin is stable.  The white blood cell count is at her baseline.  There is no significant electrolyte disturbance.  She still has low urine output.  We will keep Foley until her output improved.  We will keep her well hydrated with IV fluids.  We will continue DVT prophylaxis with Lovenox and sequential compression devices.  Son updated about patient's status.  Arnold Long, MD

## 2018-10-11 NOTE — Progress Notes (Addendum)
[place] at [place] NAME: Jamie Phillips    MR#:  [id number]  DATE OF BIRTH:  21-Mar-1941  SUBJECTIVE:   Patient states she is still having a lot of abdominal pain this morning, but she is doing better than yesterday.  She states she was pretty out of it yesterday, and feels more with it today.  No nausea or vomiting.  REVIEW OF SYSTEMS:  Review of Systems  Constitutional: Negative for chills and fever.  HENT: Negative for congestion and sore throat.   Eyes: Negative for blurred vision and double vision.  Respiratory: Negative for cough and shortness of breath.   Cardiovascular: Negative for palpitations.  Gastrointestinal: Positive for abdominal pain. Negative for nausea and vomiting.  Genitourinary: Negative for dysuria and urgency.  Musculoskeletal: Negative for back pain and neck pain.  Neurological: Negative for dizziness and headaches.  Psychiatric/Behavioral: Negative for depression. The patient is not nervous/anxious.     DRUG ALLERGIES:   Allergies  Allergen Reactions   Oxycodone Other (See Comments)    Reaction:  Hallucinations   Morphine Other (See Comments)    Reaction:  Hallucinations   VITALS:  Blood pressure (!) 111/55, pulse 73, temperature 98.1 F (36.7 C), temperature source Oral, resp. rate 18, height 5\' 5"  (1.651 m), weight 122 kg, SpO2 92 %. PHYSICAL EXAMINATION:  Physical Exam  GENERAL:  Laying in the bed with no acute distress.  HEENT: Head atraumatic, normocephalic. Pupils equal, round, reactive to light and accommodation. No scleral icterus. Extraocular muscles intact. Oropharynx and nasopharynx clear.  NECK:  Supple, no jugular venous distention. No thyroid enlargement. LUNGS: Lungs are clear to auscultation bilaterally. No wheezes, crackles, rhonchi. No use of accessory muscles of respiration.  CARDIOVASCULAR: RRR, S1, S2 normal. No murmurs, rubs, or gallops.  ABDOMEN: Soft, nondistended. Bowel sounds  present. +surgical incisions with dry dressings place. + Diffuse tenderness to palpation, no rebound or guarding. EXTREMITIES: No pedal edema, cyanosis, or clubbing.  NEUROLOGIC: CN 2-12 intact, no focal deficits. +global weakness. Sensation intact throughout. Gait not checked.  PSYCHIATRIC: The patient is alert and oriented x 3.  SKIN: No obvious rash, lesion, or ulcer.  LABORATORY PANEL:  Female CBC Recent Labs  Lab 10/11/18 0941  WBC 18.4*  HGB 10.3*  HCT 33.7*  PLT 225   ------------------------------------------------------------------------------------------------------------------ Chemistries  Recent Labs  Lab 10/09/18 1003  10/11/18 0525  NA 142   < > 139  K 3.6   < > 3.7  CL 105   < > 107  CO2 27   < > 25  GLUCOSE 128*   < > 125*  BUN 17   < > 16  CREATININE 1.02*   < > 1.03*  CALCIUM 8.7*   < > 7.7*  AST 16  --   --   ALT 13  --   --   ALKPHOS 97  --   --   BILITOT 0.7  --   --    < > = values in this interval not displayed.   RADIOLOGY:  No results found. ASSESSMENT AND PLAN:   Perforation of cecum- initially thought to be acute appendicitis, but appendix was normal during surgery. -s/p laparoscopic right hemicolectomy with primary anastomosis 6/20 -Continue IV Zosyn -Pain control   Acute hypoxic respiratory failure resolved. likely due to combination of above in addition to OSA.  Chest x-ray negative.  Now stable on room air. -CPAP qhs for OSA -Albuterol prn   History  of PE- stable -Holding eliquis  Hypertension- BP well-controlled -Holding home losartanhydrochlorothiazide for now, can restart as needed   History of CLL- stable. -Follow-up with oncologist as outpatient.  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 Please call with any additional questions.  All the records are reviewed and case discussed with Care Management/Social Worker. Management plans discussed with the patient, family and they are  in agreement.  CODE STATUS: DNR  TOTAL TIME TAKING CARE OF THIS PATIENT: 33 minutes.   More than 50% of the time was spent in counseling/coordination of care: YES  POSSIBLE D/C IN 3-4 DAYS, DEPENDING ON CLINICAL CONDITION.   Berna Spare Aryeh Butterfield M.D on 10/11/2018 at 12:13 PM  Between 7am to 6pm - Pager - [phone]  After 6pm go to [email or web address] - Technical brewer [facility]  Office  [phone]  CC: Primary care physician; Kirk Ruths, MD  Note: This dictation was prepared with Dragon dictation along with smaller phrase technology. Any transcriptional errors that result from this process are unintentional.

## 2018-10-11 NOTE — Plan of Care (Signed)
Patient doing well.  Patient still having some pain which is being controlled with medication.  Incisions C/D/I.  JP with minimal output.  Foley with adequate output.  Patient got up this afternoon and walked a short distance.  No significant changes.  I spoke to patients son today and gave him an update.

## 2018-10-12 LAB — CBC
HCT: 28.5 % — ABNORMAL LOW (ref 36.0–46.0)
Hemoglobin: 8.9 g/dL — ABNORMAL LOW (ref 12.0–15.0)
MCH: 30.7 pg (ref 26.0–34.0)
MCHC: 31.2 g/dL (ref 30.0–36.0)
MCV: 98.3 fL (ref 80.0–100.0)
Platelets: 169 10*3/uL (ref 150–400)
RBC: 2.9 MIL/uL — ABNORMAL LOW (ref 3.87–5.11)
RDW: 15.2 % (ref 11.5–15.5)
WBC: 13.9 10*3/uL — ABNORMAL HIGH (ref 4.0–10.5)
nRBC: 0 % (ref 0.0–0.2)

## 2018-10-12 LAB — PROTIME-INR
INR: 1.3 — ABNORMAL HIGH (ref 0.8–1.2)
Prothrombin Time: 15.9 seconds — ABNORMAL HIGH (ref 11.4–15.2)

## 2018-10-12 LAB — HEPARIN LEVEL (UNFRACTIONATED): Heparin Unfractionated: 0.79 IU/mL — ABNORMAL HIGH (ref 0.30–0.70)

## 2018-10-12 LAB — APTT
aPTT: 39 seconds — ABNORMAL HIGH (ref 24–36)
aPTT: 66 seconds — ABNORMAL HIGH (ref 24–36)

## 2018-10-12 MED ORDER — SODIUM CHLORIDE 0.9 % IV SOLN
INTRAVENOUS | Status: DC | PRN
  Administered 2018-10-12: 15:00:00 250 mL via INTRAVENOUS
  Administered 2018-10-16: 1000 mL via INTRAVENOUS
  Administered 2018-10-17: 05:00:00 250 mL via INTRAVENOUS

## 2018-10-12 MED ORDER — HEPARIN (PORCINE) 25000 UT/250ML-% IV SOLN
1450.0000 [IU]/h | INTRAVENOUS | Status: DC
  Administered 2018-10-12: 16:00:00 1300 [IU]/h via INTRAVENOUS
  Administered 2018-10-13: 12:00:00 1450 [IU]/h via INTRAVENOUS
  Filled 2018-10-12 (×2): qty 250

## 2018-10-12 NOTE — Progress Notes (Addendum)
ANTICOAGULATION CONSULT NOTE - Initial Consult  Pharmacy Consult for heparin Indication: pulmonary embolus (hx)  Allergies  Allergen Reactions  . Oxycodone Other (See Comments)    Reaction:  Hallucinations  . Morphine Other (See Comments)    Reaction:  Hallucinations    Patient Measurements: Height: 5\' 5"  (165.1 cm) Weight: 269 lb (122 kg) IBW/kg (Calculated) : 57 Heparin Dosing Weight: 86.5 kg  Vital Signs: Temp: 98 F (36.7 C) (06/22 1148) Temp Source: Oral (06/22 1148) BP: 146/88 (06/22 1148) Pulse Rate: 59 (06/22 1148)  Labs: Recent Labs    10/10/18 0728 10/11/18 0525 10/11/18 0941 10/12/18 1110  HGB 10.8*  --  10.3* 8.9*  HCT 34.6*  --  33.7* 28.5*  PLT 225  --  225 169  APTT  --   --   --  39*  LABPROT  --   --   --  15.9*  INR  --   --   --  1.3*  HEPARINUNFRC  --   --   --  0.79*  CREATININE 1.27* 1.03*  --   --     Estimated Creatinine Clearance: 60.9 mL/min (A) (by C-G formula based on SCr of 1.03 mg/dL (H)).   Assessment: 78 yo female on apixaban prior to admission for PE, which has been held since admission due to need for surgery/procedures. Starting heparin drip until apixaban can be restarted. Last dose of enoxaparin ppx at 0919 on 6/22. No s/sx of bleeding today per RN.   Goal of Therapy:  Heparin level 0.3-0.7 units/ml Monitor platelets by anticoagulation protocol: Yes   Plan:  No heparin bolus due to proximity to enoxaparin ppx dose this AM.  Baseline HL, aPTT, INR Will start heparin drip at 1300 units/hr (=13 ml/hr) APTT 8h after start of drip HL and CBC in AM  Pharmacy will continue to follow.   Rayna Sexton L 10/12/2018,11:58 AM

## 2018-10-12 NOTE — Progress Notes (Signed)
[facility] Hospital Day(s): 3.   Post op day(s): 2 Days Post-Op.   Interval History: Patient seen and examined, no acute events or new complaints overnight. Patient reports improved pain today.  Still with some pain but much better than yesterday.  She denies passing flatus.  Yesterday she was able to get out of bed to chair.  Vital signs in last 24 hours: [min-max] current  Temp:  [98.1 F (36.7 C)-98.5 F (36.9 C)] 98.5 F (36.9 C) (06/22 0452) Pulse Rate:  [55-73] 55 (06/22 0452) Resp:  [17-20] 20 (06/22 0452) BP: (94-111)/(55-59) 111/57 (06/22 0452) SpO2:  [93 %-94 %] 94 % (06/22 0452)     Height: 5\' 5"  (165.1 cm) Weight: 122 kg BMI (Calculated): 44.76   Drain: 190 cc in 24 hours (serous)  Physical Exam:  Constitutional: alert, cooperative and no distress  Respiratory: breathing non-labored at rest  Cardiovascular: regular rate and sinus rhythm  Gastrointestinal: soft, tender on right side of the abdomen, and non-distended.  Drain in place with serous fluid  Labs:  CBC Latest Ref Rng & Units 10/11/2018 10/10/2018 10/09/2018  WBC 4.0 - 10.5 K/uL 18.4(H) 18.3(H) 17.2(H)  Hemoglobin 12.0 - 15.0 g/dL 10.3(L) 10.8(L) 12.5  Hematocrit 36.0 - 46.0 % 33.7(L) 34.6(L) 39.4  Platelets 150 - 400 K/uL 225 225 242   CMP Latest Ref Rng & Units 10/11/2018 10/10/2018 10/09/2018  Glucose 70 - 99 mg/dL 125(H) 116(H) 128(H)  BUN 8 - 23 mg/dL 16 20 17   Creatinine 0.44 - 1.00 mg/dL 1.03(H) 1.27(H) 1.02(H)  Sodium 135 - 145 mmol/L 139 144 142  Potassium 3.5 - 5.1 mmol/L 3.7 3.7 3.6  Chloride 98 - 111 mmol/L 107 108 105  CO2 22 - 32 mmol/L 25 29 27   Calcium 8.9 - 10.3 mg/dL 7.7(L) 8.0(L) 8.7(L)  Total Protein 6.5 - 8.1 g/dL - - 6.4(L)  Total Bilirubin 0.3 - 1.2 mg/dL - - 0.7  Alkaline Phos 38 - 126 U/L - - 97  AST 15 - 41 U/L - - 16  ALT 0 - 44 U/L - - 13    Imaging studies: No new pertinent imaging studies   Assessment/Plan:  78 y.o. 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  Today with improved pain.  She was able to get out of bed.  She is still not passing flatus.  We will continue with pain management, n.p.o., will discontinue Foley.  We will continue with DVT prophylaxis.  Pending return of bowel function to be able to advance diet.  No fever, no tachycardia.  Will follow labs tomorrow morning.  Patient encouraged to get out of bed.  Arnold Long, MD

## 2018-10-12 NOTE — Care Management Important Message (Signed)
Important Message  Patient Details  Name: Jamie Phillips MRN: [id number] Date of Birth: Feb 06, 1941   Medicare Important Message Given:  Yes     Dannette Barbara 10/12/2018, 11:29 AM

## 2018-10-12 NOTE — Progress Notes (Signed)
ANTICOAGULATION CONSULT NOTE - Initial Consult  Pharmacy Consult for heparin Indication: pulmonary embolus (hx)  Allergies  Allergen Reactions  . Oxycodone Other (See Comments)    Reaction:  Hallucinations  . Morphine Other (See Comments)    Reaction:  Hallucinations    Patient Measurements: Height: 5\' 5"  (165.1 cm) Weight: 269 lb (122 kg) IBW/kg (Calculated) : 57 Heparin Dosing Weight: 86.5 kg  Vital Signs: Temp: 97.3 F (36.3 C) (06/22 2023) Temp Source: Oral (06/22 2023) BP: 116/68 (06/22 2023) Pulse Rate: 54 (06/22 2300)  Labs: Recent Labs    10/10/18 0728 10/11/18 0525 10/11/18 0941 10/12/18 1110 10/12/18 2219  HGB 10.8*  --  10.3* 8.9*  --   HCT 34.6*  --  33.7* 28.5*  --   PLT 225  --  225 169  --   APTT  --   --   --  39* 66*  LABPROT  --   --   --  15.9*  --   INR  --   --   --  1.3*  --   HEPARINUNFRC  --   --   --  0.79*  --   CREATININE 1.27* 1.03*  --   --   --     Estimated Creatinine Clearance: 60.9 mL/min (A) (by C-G formula based on SCr of 1.03 mg/dL (H)).   Assessment: 78 yo female on apixaban prior to admission for PE, which has been held since admission due to need for surgery/procedures. Starting heparin drip until apixaban can be restarted. Last dose of enoxaparin ppx at 0919 on 6/22. No s/sx of bleeding today per RN.   6/22 heparin infusion started @ 1300 units/hr.  6/22 @ 2219 aPTT 66    Goal of Therapy:  Heparin level 0.3-0.7 units/ml Monitor platelets by anticoagulation protocol: Yes  APTT: 66-102s   Plan:  6/22 @ 2219 aPTT: 66. Level just barely therapeutic.  Will increase heparin infusion to 1350 units/hr. Recheck HL and aPTT with AM labs  Pharmacy will continue to follow.   Pernell Dupre, PharmD, BCPS Clinical Pharmacist 10/12/2018 11:09 PM

## 2018-10-13 LAB — COMPREHENSIVE METABOLIC PANEL
ALT: 9 U/L (ref 0–44)
AST: 14 U/L — ABNORMAL LOW (ref 15–41)
Albumin: 2.5 g/dL — ABNORMAL LOW (ref 3.5–5.0)
Alkaline Phosphatase: 92 U/L (ref 38–126)
Anion gap: 8 (ref 5–15)
BUN: 15 mg/dL (ref 8–23)
CO2: 25 mmol/L (ref 22–32)
Calcium: 8 mg/dL — ABNORMAL LOW (ref 8.9–10.3)
Chloride: 110 mmol/L (ref 98–111)
Creatinine, Ser: 1.1 mg/dL — ABNORMAL HIGH (ref 0.44–1.00)
GFR calc Af Amer: 56 mL/min — ABNORMAL LOW (ref >60)
GFR calc non Af Amer: 49 mL/min — ABNORMAL LOW (ref >60)
Glucose, Bld: 112 mg/dL — ABNORMAL HIGH (ref 70–99)
Potassium: 3.6 mmol/L (ref 3.5–5.1)
Sodium: 143 mmol/L (ref 135–145)
Total Bilirubin: 0.5 mg/dL (ref 0.3–1.2)
Total Protein: 5.4 g/dL — ABNORMAL LOW (ref 6.5–8.1)

## 2018-10-13 LAB — CBC
HCT: 29.2 % — ABNORMAL LOW (ref 36.0–46.0)
Hemoglobin: 9 g/dL — ABNORMAL LOW (ref 12.0–15.0)
MCH: 30.9 pg (ref 26.0–34.0)
MCHC: 30.8 g/dL (ref 30.0–36.0)
MCV: 100.3 fL — ABNORMAL HIGH (ref 80.0–100.0)
Platelets: 224 10*3/uL (ref 150–400)
RBC: 2.91 MIL/uL — ABNORMAL LOW (ref 3.87–5.11)
RDW: 15.2 % (ref 11.5–15.5)
WBC: 15.8 10*3/uL — ABNORMAL HIGH (ref 4.0–10.5)
nRBC: 0 % (ref 0.0–0.2)

## 2018-10-13 LAB — HEPARIN LEVEL (UNFRACTIONATED): Heparin Unfractionated: 0.81 IU/mL — ABNORMAL HIGH (ref 0.30–0.70)

## 2018-10-13 LAB — APTT: aPTT: 62 seconds — ABNORMAL HIGH (ref 24–36)

## 2018-10-13 MED ORDER — HYDROMORPHONE HCL 1 MG/ML IJ SOLN
0.5000 mg | INTRAMUSCULAR | Status: DC | PRN
  Administered 2018-10-13 – 2018-10-15 (×5): 0.5 mg via INTRAVENOUS
  Filled 2018-10-13 (×6): qty 0.5

## 2018-10-13 MED ORDER — APIXABAN 5 MG PO TABS
5.0000 mg | ORAL_TABLET | Freq: Two times a day (BID) | ORAL | Status: DC
  Administered 2018-10-13 – 2018-10-17 (×6): 5 mg via ORAL
  Filled 2018-10-13 (×6): qty 1

## 2018-10-13 NOTE — Progress Notes (Signed)
[facility] Hospital Day(s): 4.   Post op day(s): 3 Days Post-Op.   Interval History: Patient seen and examined, no acute events or new complaints overnight. Patient reports having intermittent abdominal pain.  Pain is controlled with current pain medication.  She tolerated full liquid diet.  She was able to ambulate yesterday.  Denies fever or chills.  There is no tachycardia.  Adequate blood pressures.  Vital signs in last 24 hours: [min-max] current  Temp:  [97.3 F (36.3 C)-97.7 F (36.5 C)] 97.7 F (36.5 C) (06/23 1222) Pulse Rate:  [45-55] 45 (06/23 1222) Resp:  [18-20] 18 (06/23 0453) BP: (110-139)/(65-70) 110/65 (06/23 1222) SpO2:  [90 %-93 %] 91 % (06/23 1222)     Height: 5\' 5"  (165.1 cm) Weight: 122 kg BMI (Calculated): 44.76   Drain: 130 cc in 24 hours (serous)  Physical Exam:  Constitutional: alert, cooperative and no distress  Respiratory: breathing non-labored at rest  Cardiovascular: regular rate and sinus rhythm  Gastrointestinal: soft, non-tender, and non-distended.  Wound is dry and clean  Labs:  CBC Latest Ref Rng & Units 10/13/2018 10/12/2018 10/11/2018  WBC 4.0 - 10.5 K/uL 15.8(H) 13.9(H) 18.4(H)  Hemoglobin 12.0 - 15.0 g/dL 9.0(L) 8.9(L) 10.3(L)  Hematocrit 36.0 - 46.0 % 29.2(L) 28.5(L) 33.7(L)  Platelets 150 - 400 K/uL 224 169 225   CMP Latest Ref Rng & Units 10/13/2018 10/11/2018 10/10/2018  Glucose 70 - 99 mg/dL 112(H) 125(H) 116(H)  BUN 8 - 23 mg/dL 15 16 20   Creatinine 0.44 - 1.00 mg/dL 1.10(H) 1.03(H) 1.27(H)  Sodium 135 - 145 mmol/L 143 139 144  Potassium 3.5 - 5.1 mmol/L 3.6 3.7 3.7  Chloride 98 - 111 mmol/L 110 107 108  CO2 22 - 32 mmol/L 25 25 29   Calcium 8.9 - 10.3 mg/dL 8.0(L) 7.7(L) 8.0(L)  Total Protein 6.5 - 8.1 g/dL 5.4(L) - -  Total Bilirubin 0.3 - 1.2 mg/dL 0.5 - -  Alkaline Phos 38 - 126 U/L 92 - -  AST 15 - 41 U/L 14(L) - -  ALT 0 - 44 U/L 9 - -    Imaging studies: No new pertinent imaging  studies   Assessment/Plan:  77 y.o.femalewith cecum ischemia3 Day Post-Ops/p right colectomy, complicated by pertinent comorbidities includingCLL, 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 Patient continued to recover slowly.  Pain with better control but still intermittent and need of IV pain medication.  Patient tolerated full liquid diet.  Will advance diet to soft diet.  White blood cell count within normal limits for her.  Hemoglobin stable.  Renal function stable.  Electrolytes within normal limits.  There is no fever or tachycardia.  Will encourage patient to ambulate today again.  Will change anticoagulation to her home Eliquis.  Arnold Long, MD

## 2018-10-13 NOTE — Progress Notes (Addendum)
ANTICOAGULATION CONSULT NOTE - Initial Consult  Pharmacy Consult for heparin Indication: pulmonary embolus (hx)  Allergies  Allergen Reactions  . Oxycodone Other (See Comments)    Reaction:  Hallucinations  . Morphine Other (See Comments)    Reaction:  Hallucinations    Patient Measurements: Height: 5\' 5"  (165.1 cm) Weight: 269 lb (122 kg) IBW/kg (Calculated) : 57 Heparin Dosing Weight: 86.5 kg  Vital Signs: Temp: 97.7 F (36.5 C) (06/23 0453) Temp Source: Oral (06/23 0453) BP: 120/70 (06/23 0453) Pulse Rate: 50 (06/23 0453)  Labs: Recent Labs    10/10/18 0728 10/11/18 0525 10/11/18 0941 10/12/18 1110 10/12/18 2219 10/13/18 0606  HGB 10.8*  --  10.3* 8.9*  --  9.0*  HCT 34.6*  --  33.7* 28.5*  --  29.2*  PLT 225  --  225 169  --  224  APTT  --   --   --  39* 66* 62*  LABPROT  --   --   --  15.9*  --   --   INR  --   --   --  1.3*  --   --   HEPARINUNFRC  --   --   --  0.79*  --  0.81*  CREATININE 1.27* 1.03*  --   --   --  1.10*    Estimated Creatinine Clearance: 57 mL/min (A) (by C-G formula based on SCr of 1.1 mg/dL (H)).   Assessment: 78 yo female on apixaban prior to admission for PE, which has been held since admission due to need for surgery/procedures. Starting heparin drip until apixaban can be restarted. Last dose of enoxaparin ppx at 0919 on 6/22. No s/sx of bleeding today per RN.   6/22 heparin infusion started @ 1300 units/hr.  6/22 @ 2219 aPTT 66. Will increase heparin infusion to 1350 units/hr. 6/23 @ 0606 aPTT: 62  Goal of Therapy:  Heparin level 0.3-0.7 units/ml Monitor platelets by anticoagulation protocol: Yes  APTT: 66-102s   Plan:  6/23 @ 0606 aPTT: 62. Level is subtherapeutic.  Will increase heparin infusion to 1450 units/hr. Recheck aPTT level in 8 hours.  Continue to monitor CBC daily per protocol.   Pharmacy will continue to follow.   Pernell Dupre, PharmD, BCPS Clinical Pharmacist 10/13/2018 6:53 AM

## 2018-10-13 NOTE — Plan of Care (Signed)
Patient doing well.  Incisions C/D/I.  Patient with active bowel sounds.  She has also had some BMs today too.  MD advanced her diet to soft and she has tolerated it well.  Pain has been controlled with Tramadol today.  She ambulated once in the hallway today.  No significant changes.  Talked to patients son and gave him an update.

## 2018-10-14 ENCOUNTER — Inpatient Hospital Stay: Payer: Medicare HMO

## 2018-10-14 LAB — URINALYSIS, ROUTINE W REFLEX MICROSCOPIC
Bilirubin Urine: NEGATIVE
Glucose, UA: NEGATIVE mg/dL
Hgb urine dipstick: NEGATIVE
Ketones, ur: NEGATIVE mg/dL
Leukocytes,Ua: NEGATIVE
Nitrite: NEGATIVE
Protein, ur: NEGATIVE mg/dL
Specific Gravity, Urine: 1.019 (ref 1.005–1.030)
pH: 5 (ref 5.0–8.0)

## 2018-10-14 LAB — SURGICAL PATHOLOGY

## 2018-10-14 MED ORDER — KETOROLAC TROMETHAMINE 30 MG/ML IJ SOLN
15.0000 mg | Freq: Once | INTRAMUSCULAR | Status: AC
  Administered 2018-10-14: 15 mg via INTRAVENOUS
  Filled 2018-10-14: qty 1

## 2018-10-14 MED ORDER — SIMETHICONE 80 MG PO CHEW
80.0000 mg | CHEWABLE_TABLET | Freq: Once | ORAL | Status: AC
  Administered 2018-10-14: 80 mg via ORAL
  Filled 2018-10-14: qty 1

## 2018-10-14 MED ORDER — IOHEXOL 240 MG/ML SOLN
50.0000 mL | INTRAMUSCULAR | Status: AC
  Administered 2018-10-14 (×2): 50 mL via ORAL

## 2018-10-14 MED ORDER — PANTOPRAZOLE SODIUM 40 MG PO TBEC
40.0000 mg | DELAYED_RELEASE_TABLET | Freq: Every day | ORAL | Status: DC
  Administered 2018-10-14 – 2018-10-16 (×3): 40 mg via ORAL
  Filled 2018-10-14 (×3): qty 1

## 2018-10-14 MED ORDER — ALUM & MAG HYDROXIDE-SIMETH 200-200-20 MG/5ML PO SUSP
30.0000 mL | Freq: Four times a day (QID) | ORAL | Status: DC | PRN
  Administered 2018-10-14: 15:00:00 30 mL via ORAL
  Filled 2018-10-14: qty 30

## 2018-10-14 MED ORDER — IOHEXOL 300 MG/ML  SOLN
100.0000 mL | Freq: Once | INTRAMUSCULAR | Status: AC | PRN
  Administered 2018-10-14: 06:00:00 100 mL via INTRAVENOUS

## 2018-10-14 NOTE — Progress Notes (Addendum)
PHARMACIST - PHYSICIAN COMMUNICATION  DR:  Windell Moment  CONCERNING: IV to Oral Route Change Policy  RECOMMENDATION: This patient is receiving pantoprazole/diphenhydramine by the intravenous route.  Based on criteria approved by the Pharmacy and Therapeutics Committee, the intravenous medication(s) is/are being converted to the equivalent oral dose form(s).   DESCRIPTION: These criteria include:  The patient is eating (either orally or via tube) and/or has been taking other orally administered medications for a least 24 hours  The patient has no evidence of active gastrointestinal bleeding or impaired GI absorption (gastrectomy, short bowel, patient on TNA or NPO).  If you have questions about this conversion, please contact the Pharmacy Department  []   [phone] )  [facility] [x]   [phone] )  [facility] []   [phone] )  [facility] []   [phone] )  [facility] []   [phone] )  [place], PharmD, BCPS Clinical Pharmacist 10/14/2018 1:56 PM

## 2018-10-14 NOTE — Progress Notes (Signed)
[facility] Hospital Day(s): 5.   Post op day(s): 4 Days Post-Op.   Interval History: Patient seen and examined.  Overnight events reviewed.  Patient with on and off intense pains on the right side of the abdomen.  CT scan of the abdomen was done with oral contrast and shows no significant free air, no leak of contrast no significant inflammation of the area of the anastomosis or in the upper or lower abdomen.  There is no sign of bleeding there is no significant free fluid.  I personally reviewed the images.  Vital signs in last 24 hours: [min-max] current  Temp:  [97.5 F (36.4 C)-98.7 F (37.1 C)] 97.5 F (36.4 C) (06/24 0403) Pulse Rate:  [45-63] 54 (06/24 0403) Resp:  [18-24] 18 (06/24 0403) BP: (110-139)/(59-70) 122/59 (06/24 0403) SpO2:  [91 %-96 %] 93 % (06/24 0403)     Height: 5\' 5"  (165.1 cm) Weight: 122 kg BMI (Calculated): 44.76   Physical Exam:  Constitutional: alert, cooperative and no distress  Respiratory: breathing non-labored at rest  Cardiovascular: regular rate and sinus rhythm  Gastrointestinal: soft, non-tender, and non-distended  Labs:  CBC Latest Ref Rng & Units 10/13/2018 10/12/2018 10/11/2018  WBC 4.0 - 10.5 K/uL 15.8(H) 13.9(H) 18.4(H)  Hemoglobin 12.0 - 15.0 g/dL 9.0(L) 8.9(L) 10.3(L)  Hematocrit 36.0 - 46.0 % 29.2(L) 28.5(L) 33.7(L)  Platelets 150 - 400 K/uL 224 169 225   CMP Latest Ref Rng & Units 10/13/2018 10/11/2018 10/10/2018  Glucose 70 - 99 mg/dL 112(H) 125(H) 116(H)  BUN 8 - 23 mg/dL 15 16 20   Creatinine 0.44 - 1.00 mg/dL 1.10(H) 1.03(H) 1.27(H)  Sodium 135 - 145 mmol/L 143 139 144  Potassium 3.5 - 5.1 mmol/L 3.6 3.7 3.7  Chloride 98 - 111 mmol/L 110 107 108  CO2 22 - 32 mmol/L 25 25 29   Calcium 8.9 - 10.3 mg/dL 8.0(L) 7.7(L) 8.0(L)  Total Protein 6.5 - 8.1 g/dL 5.4(L) - -  Total Bilirubin 0.3 - 1.2 mg/dL 0.5 - -  Alkaline Phos 38 - 126 U/L 92 - -  AST 15 - 41 U/L 14(L) - -  ALT 0 - 44 U/L 9 - -    Imaging studies: No new  pertinent imaging studies   Assessment/Plan:  78 y.o.femalewith cecum ischemia4Day Post-Ops/p right colectomy, complicated by pertinent comorbidities includingCLL, 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 Patient with intermittent right abdominal pain.  Last night was intensified.  I appreciate Dr. Lysle Pearl evaluation and agree with adding Toradol to pain management.  Also agree with CT scan done.  CT scan did not show any acute intra-abdominal pathology that explain her intermittent pains.  There is no anastomosis leak, there is no free fluid or free air.  I am going to removing the drain to see if this is causing pain to the patient.  There is no sign of bleeding.  Patient has been tolerating soft diet.  The CT scan shows that the contrast past the anastomosis and is getting to the distal large intestine. Continue to follow her closely with serial physical exam vital signs.  There has been no fever or tachycardia.  The vital cell count has been normal for her baseline.  CMP yesterday shows no significant electrolyte, no liver enzyme elevation and normal bilirubin.  If pain persists and unable to be control and better consider HIDA scan for evaluation of the gallbladder due to his stones but  images and surgery did not reveal significant inflammation of the gallbladder.  Will hold Eliquis in case any interventional procedure needs to be done.  Arnold Long, MD

## 2018-10-14 NOTE — Progress Notes (Signed)
Notified dr.sakai of pt c/o severe pain in left side with no relief from pain meds. Acknowledged and stated on way in to see pt. Saw pt with new orders placed. Cont to monitor.

## 2018-10-14 NOTE — Plan of Care (Signed)
Patient doing better today since Dr. Peyton Najjar removed the JP drain.  She has still had some pain in the epigastric area which pain medication and Maalox has helped.  Patient has had some diarrhea that has had some blood in it but the MD is aware and said that is ok.  She has tolerated her diet.  She has also sat up in the chair for part of the shift.  No significant changes.  I spoke with the patient's son and gave him an update.

## 2018-10-14 NOTE — Progress Notes (Signed)
Subjective:  CC: Jamie Phillips is a 78 y.o. female  Hospital stay day 5, 4 Days Post-[place] hand assisted right hemicolectomy  HPI: Called by RN due to increased new onset right sided abdominal pain.  RN stated this is a new complaint since she has been taking care of patient for past few days.  At bedside, patient looks uncomfortable.  States this same type of pain is what brought her into the hospital, along with LUQ pain.  She states this same type of stabbing, intermittent pain has been present her entire hospital stay.  IV pain meds have made her sleepy, and made it better, but tonight has been the worst it has ever been and dilaudid not helping at all.  She has otherwise been tolerating a diet, having BMs.    ROS:  General: Denies weight loss, weight gain, fatigue, fevers, chills, and night sweats. Heart: Denies chest pain, palpitations, racing heart, irregular heartbeat, leg pain or swelling, and decreased activity tolerance. Respiratory: Denies breathing difficulty, shortness of breath, wheezing, cough, and sputum. GI: Denies change in appetite, heartburn, nausea, vomiting, constipation, diarrhea, and blood in stool. GU: Denies difficulty urinating, pain with urinating, urgency, frequency, blood in urine.   Objective:   Temp:  [97.5 F (36.4 C)-98.7 F (37.1 C)] 97.5 F (36.4 C) (06/24 0158) Pulse Rate:  [45-63] 63 (06/24 0158) Resp:  [18-24] 24 (06/24 0158) BP: (110-139)/(65-70) 122/70 (06/24 0158) SpO2:  [90 %-96 %] 92 % (06/24 0158)     Height: 5\' 5"  (165.1 cm) Weight: 122 kg BMI (Calculated): 44.76   Intake/Output this shift:   Intake/Output Summary (Last 24 hours) at 10/14/2018 4680 Last data filed at 10/13/2018 2342 Gross per 24 hour  Intake 2812.93 ml  Output 380 ml  Net 2432.93 ml  JP with serosanguinous fluid  Constitutional :  alert, cooperative, appears stated age and mild distress  Respiratory:  clear to auscultation bilaterally  Cardiovascular:  regular  rate and rhythm  Gastrointestinal: soft, but focal guarding in RLQ and RUQ with worsening pain upon palpation.  remaining quadrants remain soft, no pain at all.   Skin: Cool and moist. Incisions, clean/dry/intact staples.  No rash  Psychiatric: Normal affect, non-agitated, not confused       LABS:  CMP Latest Ref Rng & Units 10/13/2018 10/11/2018 10/10/2018  Glucose 70 - 99 mg/dL 112(H) 125(H) 116(H)  BUN 8 - 23 mg/dL 15 16 20   Creatinine 0.44 - 1.00 mg/dL 1.10(H) 1.03(H) 1.27(H)  Sodium 135 - 145 mmol/L 143 139 144  Potassium 3.5 - 5.1 mmol/L 3.6 3.7 3.7  Chloride 98 - 111 mmol/L 110 107 108  CO2 22 - 32 mmol/L 25 25 29   Calcium 8.9 - 10.3 mg/dL 8.0(L) 7.7(L) 8.0(L)  Total Protein 6.5 - 8.1 g/dL 5.4(L) - -  Total Bilirubin 0.3 - 1.2 mg/dL 0.5 - -  Alkaline Phos 38 - 126 U/L 92 - -  AST 15 - 41 U/L 14(L) - -  ALT 0 - 44 U/L 9 - -   CBC Latest Ref Rng & Units 10/13/2018 10/12/2018 10/11/2018  WBC 4.0 - 10.5 K/uL 15.8(H) 13.9(H) 18.4(H)  Hemoglobin 12.0 - 15.0 g/dL 9.0(L) 8.9(L) 10.3(L)  Hematocrit 36.0 - 46.0 % 29.2(L) 28.5(L) 33.7(L)  Platelets 150 - 400 K/uL 224 169 225    RADS: Pending CT a/p  Assessment:   S/p hand assisted right hemicolectomy for ischemic cecum, unknown cause.  Pain reported by patient to be present since admission, but RN states she  has not heard her complain of such pain while in her care.   Will try toradol x1 for pain control.  With advancement of diet, gas pain could be in differential, hard to assess for distention due to body habitus, will add simethicone x1 as well.  Will proceed with CT a/p as well, due to increase wbc today, and worsening pain around operative site per patient report.  Eliquis was restarted today.  Vitals stable at this time, so no need for immediate intervention.

## 2018-10-14 NOTE — Progress Notes (Signed)
Talked to Dr Peyton Najjar about patient having some loose stools with little bit of blood in it.  He acknowledged but no orders given.

## 2018-10-14 NOTE — Progress Notes (Signed)
Surgery Follow Up Note  Patient report feeling much better that last night. Pain now on epigastric area but the pain on the right side of the abdomen much more improved. No nausea or vomiting. Patient tolerating soft diet. Will continue with pain management. Patient was able to get out of bed to chair. Will continue with IV antibiotic therapy. Pathology came back reporting perforated diverticulitis of the cecum.   Vitals:   10/14/18 0931 10/14/18 1203  BP: (!) 152/74 (!) 141/68  Pulse: (!) 56 (!) 53  Resp:  18  Temp:  (!) 97.5 F (36.4 C)  SpO2:  94%   Physical Exam  Constitutional: She is well-developed, well-nourished, and in no distress.  Cardiovascular: Normal rate.  Pulmonary/Chest: Effort normal.  Abdominal: Soft. Bowel sounds are normal. She exhibits no distension and no mass. There is abdominal tenderness. There is no rebound and no guarding.  Wounds are dry and clean.   Herbert Pun, MD

## 2018-10-15 MED ORDER — HYDROCODONE-ACETAMINOPHEN 7.5-325 MG PO TABS
1.0000 | ORAL_TABLET | Freq: Four times a day (QID) | ORAL | Status: AC | PRN
  Administered 2018-10-15: 1 via ORAL
  Filled 2018-10-15: qty 1

## 2018-10-15 MED ORDER — GABAPENTIN 300 MG PO CAPS
300.0000 mg | ORAL_CAPSULE | Freq: Three times a day (TID) | ORAL | Status: DC
  Administered 2018-10-15 – 2018-10-17 (×7): 300 mg via ORAL
  Filled 2018-10-15 (×7): qty 1

## 2018-10-15 NOTE — Care Management Important Message (Signed)
Important Message  Patient Details  Name: Jamie Phillips MRN: [id number] Date of Birth: 04-22-1941   Medicare Important Message Given:  Yes     Dannette Barbara 10/15/2018, 12:02 PM

## 2018-10-15 NOTE — Progress Notes (Addendum)
[facility] Hospital Day(s): 6.   Post op day(s): 5 Days Post-Op.   Interval History: Patient seen and examined, no acute events or new complaints overnight. Patient reports she had a better night last night.  She still has abdominal pain normal more localized in the epigastric area.  Right-sided abdominal pain has improved.  Now in the afternoon she felt very nauseous and nausea medication was started.  She was able to sleep the lunch.  Denies fever or chills.  Reports continue passing gas and having bowel movements.  Vital signs in last 24 hours: [min-max] current  Temp:  [97.5 F (36.4 C)-98.6 F (37 C)] 97.5 F (36.4 C) (06/25 0405) Pulse Rate:  [54-63] 54 (06/25 1023) Resp:  [20] 20 (06/25 0405) BP: (121-152)/(62-82) 121/62 (06/25 1023) SpO2:  [93 %-95 %] 95 % (06/25 0405)     Height: 5\' 5"  (165.1 cm) Weight: 122 kg BMI (Calculated): 44.76   Physical Exam:  Constitutional: alert, cooperative and no distress  Respiratory: breathing non-labored at rest  Cardiovascular: regular rate and sinus rhythm  Gastrointestinal: soft, moderate tender in the epigastric and right side of the abdomen, and non-distended.  Wound is dry and clean.  Multiple ecchymosis on the abdominal area.  Labs:  CBC Latest Ref Rng & Units 10/13/2018 10/12/2018 10/11/2018  WBC 4.0 - 10.5 K/uL 15.8(H) 13.9(H) 18.4(H)  Hemoglobin 12.0 - 15.0 g/dL 9.0(L) 8.9(L) 10.3(L)  Hematocrit 36.0 - 46.0 % 29.2(L) 28.5(L) 33.7(L)  Platelets 150 - 400 K/uL 224 169 225   CMP Latest Ref Rng & Units 10/13/2018 10/11/2018 10/10/2018  Glucose 70 - 99 mg/dL 112(H) 125(H) 116(H)  BUN 8 - 23 mg/dL 15 16 20   Creatinine 0.44 - 1.00 mg/dL 1.10(H) 1.03(H) 1.27(H)  Sodium 135 - 145 mmol/L 143 139 144  Potassium 3.5 - 5.1 mmol/L 3.6 3.7 3.7  Chloride 98 - 111 mmol/L 110 107 108  CO2 22 - 32 mmol/L 25 25 29   Calcium 8.9 - 10.3 mg/dL 8.0(L) 7.7(L) 8.0(L)  Total Protein 6.5 - 8.1 g/dL 5.4(L) - -  Total Bilirubin 0.3 - 1.2  mg/dL 0.5 - -  Alkaline Phos 38 - 126 U/L 92 - -  AST 15 - 41 U/L 14(L) - -  ALT 0 - 44 U/L 9 - -    Imaging studies: No new pertinent imaging studies   Assessment/Plan:  78 y.o.femalewith perforated diverticulitis of the cecum5Day Post-Ops/p right colectomy, complicated by pertinent comorbidities includingCLL, 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 Patient  continue intermittent abdominal pain.  Now in the afternoon at a to be more nauseous.  Denies vomiting.  After medication was administrated.  The severe abdominal pain in the right side of the abdomen has improved.  Patient continue passing gas and having bowel movement.  There is no fever no tachycardia.  We will continue with IV antibiotic therapy.  We will continue with therapeutic anticoagulation.  We will continue with pain management.  Encourage patient to get out of bed and ambulate as tolerated.  Arnold Long, MD   Son updated of patient's status.

## 2018-10-16 ENCOUNTER — Encounter: Payer: Self-pay | Admitting: Radiology

## 2018-10-16 ENCOUNTER — Inpatient Hospital Stay: Payer: Medicare HMO

## 2018-10-16 MED ORDER — OXYCODONE HCL 5 MG PO TABS
5.0000 mg | ORAL_TABLET | ORAL | Status: DC | PRN
  Administered 2018-10-16 – 2018-10-17 (×3): 5 mg via ORAL
  Filled 2018-10-16 (×3): qty 1

## 2018-10-16 MED ORDER — WITCH HAZEL-GLYCERIN EX PADS
MEDICATED_PAD | CUTANEOUS | Status: DC | PRN
  Administered 2018-10-16: 21:00:00 via TOPICAL
  Filled 2018-10-16: qty 100

## 2018-10-16 MED ORDER — TECHNETIUM TC 99M MEBROFENIN IV KIT
5.2990 | PACK | Freq: Once | INTRAVENOUS | Status: AC | PRN
  Administered 2018-10-16: 5.299 via INTRAVENOUS

## 2018-10-16 MED ORDER — SUCRALFATE 1 GM/10ML PO SUSP
1.0000 g | Freq: Three times a day (TID) | ORAL | Status: DC
  Administered 2018-10-16 – 2018-10-17 (×4): 1 g via ORAL
  Filled 2018-10-16 (×4): qty 10

## 2018-10-16 NOTE — Progress Notes (Signed)
[facility] Hospital Day(s): 7.   Post op day(s): 6 Days Post-Op.   Interval History: Patient seen and examined, no acute events or new complaints overnight. Patient reports continue with significant abdominal pain. Pain now on the epigastric area that radiates to the right upper quadrant. Pain improving very slowly. Still needing IV pain medications. , denies nausea or vomiting. Reports passing gas.   Vital signs in last 24 hours: [min-max] current  Temp:  [97.8 F (36.6 C)-98.8 F (37.1 C)] 97.8 F (36.6 C) (06/26 1957) Pulse Rate:  [53-62] 57 (06/26 1957) Resp:  [16-20] 16 (06/26 1957) BP: (143-155)/(63-94) 143/63 (06/26 1957) SpO2:  [94 %-95 %] 95 % (06/26 1957)     Height: 5\' 5"  (165.1 cm) Weight: 122 kg BMI (Calculated): 44.76   Physical Exam:  Constitutional: alert, cooperative and no distress  Respiratory: breathing non-labored at rest  Cardiovascular: regular rate and sinus rhythm  Gastrointestinal: soft, moderate-tender, and non-distended. Multiple ecchymosis.   Labs:  CBC Latest Ref Rng & Units 10/13/2018 10/12/2018 10/11/2018  WBC 4.0 - 10.5 K/uL 15.8(H) 13.9(H) 18.4(H)  Hemoglobin 12.0 - 15.0 g/dL 9.0(L) 8.9(L) 10.3(L)  Hematocrit 36.0 - 46.0 % 29.2(L) 28.5(L) 33.7(L)  Platelets 150 - 400 K/uL 224 169 225   CMP Latest Ref Rng & Units 10/13/2018 10/11/2018 10/10/2018  Glucose 70 - 99 mg/dL 112(H) 125(H) 116(H)  BUN 8 - 23 mg/dL 15 16 20   Creatinine 0.44 - 1.00 mg/dL 1.10(H) 1.03(H) 1.27(H)  Sodium 135 - 145 mmol/L 143 139 144  Potassium 3.5 - 5.1 mmol/L 3.6 3.7 3.7  Chloride 98 - 111 mmol/L 110 107 108  CO2 22 - 32 mmol/L 25 25 29   Calcium 8.9 - 10.3 mg/dL 8.0(L) 7.7(L) 8.0(L)  Total Protein 6.5 - 8.1 g/dL 5.4(L) - -  Total Bilirubin 0.3 - 1.2 mg/dL 0.5 - -  Alkaline Phos 38 - 126 U/L 92 - -  AST 15 - 41 U/L 14(L) - -  ALT 0 - 44 U/L 9 - -    Imaging studies: No new pertinent imaging studies   Assessment/Plan:  78 y.o.femalewith perforated  diverticulitis of the cecum6Day Post-Ops/p right colectomy, complicated by pertinent comorbidities includingCLL, 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 Today patient with persistent significant abdominal pain. Pain somewhat better than yesterday. Pain now more on the epigastric area. Due to her finding of gallstone on CT scan will do HIDA to rule out cholecystitis. The HIDA was negative. Will add Carafate to treat pain due to gastritis vs PUD. No fever or tachycardia. No nausea or vomiting. Patient tolerating soft diet. Having gas and bowel movement. Will continue IV antibiotic, Eliquis for anticoagulation. Patient encourage to get out of bed. Will follow up pain improvement. Unable to be discharged with this pain.   Arnold Long, MD

## 2018-10-17 LAB — CBC
HCT: 27.9 % — ABNORMAL LOW (ref 36.0–46.0)
Hemoglobin: 8.5 g/dL — ABNORMAL LOW (ref 12.0–15.0)
MCH: 29.8 pg (ref 26.0–34.0)
MCHC: 30.5 g/dL (ref 30.0–36.0)
MCV: 97.9 fL (ref 80.0–100.0)
Platelets: 260 10*3/uL (ref 150–400)
RBC: 2.85 MIL/uL — ABNORMAL LOW (ref 3.87–5.11)
RDW: 15.5 % (ref 11.5–15.5)
WBC: 18.6 10*3/uL — ABNORMAL HIGH (ref 4.0–10.5)
nRBC: 0 % (ref 0.0–0.2)

## 2018-10-17 LAB — BASIC METABOLIC PANEL
Anion gap: 11 (ref 5–15)
BUN: 13 mg/dL (ref 8–23)
CO2: 25 mmol/L (ref 22–32)
Calcium: 8.2 mg/dL — ABNORMAL LOW (ref 8.9–10.3)
Chloride: 108 mmol/L (ref 98–111)
Creatinine, Ser: 0.79 mg/dL (ref 0.44–1.00)
GFR calc Af Amer: >60 mL/min (ref >60)
GFR calc non Af Amer: >60 mL/min (ref >60)
Glucose, Bld: 100 mg/dL — ABNORMAL HIGH (ref 70–99)
Potassium: 4 mmol/L (ref 3.5–5.1)
Sodium: 144 mmol/L (ref 135–145)

## 2018-10-17 MED ORDER — SUCRALFATE 1 GM/10ML PO SUSP: 1.0000 g | Freq: Three times a day (TID) | ORAL | 0 refills | Status: DC

## 2018-10-17 MED ORDER — PANTOPRAZOLE SODIUM 40 MG PO TBEC: 40.0000 mg | DELAYED_RELEASE_TABLET | Freq: Two times a day (BID) | ORAL | 0 refills | Status: DC

## 2018-10-17 MED ORDER — OXYCODONE HCL 5 MG PO TABS: 5.0000 mg | ORAL_TABLET | ORAL | 0 refills | Status: DC | PRN

## 2018-10-17 NOTE — Discharge Instructions (Signed)

## 2018-10-17 NOTE — Progress Notes (Signed)
10/17/2018 12:38 PM  Jamie Phillips to be D/C'd Home per MD order.  Discussed prescriptions and follow up appointments with the patient. Prescriptions given to patient, medication list explained in detail. Pt verbalized understanding.  Allergies as of 10/17/2018      Reactions   Oxycodone Other (See Comments)   Reaction:  Hallucinations   Morphine Other (See Comments)   Reaction:  Hallucinations      Medication List    TAKE these medications   acetaminophen 650 MG CR tablet Commonly known as: TYLENOL Take 650-1,300 mg by mouth every 8 (eight) hours as needed for pain. TYLENOL ARTHRITIS Notes to patient: As needed   albuterol 108 (90 Base) MCG/ACT inhaler Commonly known as: VENTOLIN HFA Inhale 2 puffs into the lungs every 6 (six) hours as needed for wheezing or shortness of breath. Notes to patient: As needed   amoxicillin 500 MG capsule Commonly known as: AMOXIL Take 2,000 mg by mouth. Prior to dental appointments Notes to patient: Before dental aappointment   Eliquis 5 MG Tabs tablet Generic drug: apixaban TAKE 1 TABLET TWICE DAILY What changed: how much to take Notes to patient: Before bed 6/27   escitalopram 10 MG tablet Commonly known as: LEXAPRO Take 10 mg by mouth daily. Notes to patient: Morning 6/28   gabapentin 300 MG capsule Commonly known as: NEURONTIN Take 300 mg by mouth 2 (two) times daily. Notes to patient: Before bed 6/27   losartan-hydrochlorothiazide 100-12.5 MG tablet Commonly known as: HYZAAR Take 1 tablet by mouth daily. Reported on 08/22/2015 Notes to patient: Morning 6/28   oxyCODONE 5 MG immediate release tablet Commonly known as: Oxy IR/ROXICODONE Take 1 tablet (5 mg total) by mouth every 4 (four) hours as needed for moderate pain or severe pain. Notes to patient: As needed   pantoprazole 40 MG tablet Commonly known as: PROTONIX Take 1 tablet (40 mg total) by mouth 2 (two) times daily for 14 days. What changed: when to take this Notes  to patient: Before bed 6/27   pravastatin 40 MG tablet Commonly known as: PRAVACHOL Take 40 mg by mouth daily. Notes to patient: Morning 6/28   PRESERVISION AREDS 2 PO Take 1 capsule by mouth 2 (two) times daily. Notes to patient: Before bed 6/27   propranolol 40 MG tablet Commonly known as: INDERAL Take 40 mg by mouth 2 (two) times a day. Notes to patient: Before bed 6/27   sucralfate 1 GM/10ML suspension Commonly known as: CARAFATE Take 10 mLs (1 g total) by mouth 4 (four) times daily -  with meals and at bedtime. Notes to patient: With evening meal   tiZANidine 4 MG tablet Commonly known as: ZANAFLEX Take 4 mg by mouth at bedtime as needed for muscle spasms. Notes to patient: As needed at bed time       Vitals:   10/16/18 1957 10/17/18 0525  BP: (!) 143/63 (!) 144/72  Pulse: (!) 57 (!) 56  Resp: 16   Temp: 97.8 F (36.6 C) 98 F (36.7 C)  SpO2: 95% 98%    Skin clean, dry and intact without evidence of skin break down, no evidence of skin tears noted. IV catheter discontinued intact. Site without signs and symptoms of complications. Dressing and pressure applied. Pt denies pain at this time. No complaints noted.  An After Visit Summary was printed and given to the patient. Patient escorted via [employer], and D/C home via private auto.  Delbert Harness E \

## 2018-10-17 NOTE — Discharge Summary (Signed)
Patient ID: Jamie Phillips MRN: [id number] DOB/AGE: 77/28/1942 77 y.o.  Admit date: 10/09/2018 Discharge date: 10/17/2018   Discharge Diagnoses:  Active Problems:   Right lower quadrant pain   Acute diverticulitis with perforation  Procedures: Laparoscopic hand-assisted right colectomy with anastomosis  Hospital Course: Patient was admitted with right lower current pain.  She was on therapeutic anticoagulation.  CT scan showed stranding of the cecum and nonvisualization of the appendix.  Acute appendicitis was suspected and patient was taken for diagnostic laparoscopy.  During the surgery she was found with a ischemic portion of the cecum and right colectomy was done.  Final pathology came with perforated diverticulitis.  She tolerated the surgery and recovered very slowly with significant pain.  During this week pain management has been optimized up to today that she has been more comfortable.  I think that she had a component of significant gastritis because the pain was improved with Carafate and PPIs.  Due to this pain a CT scan of the abdomen was done showing normal anastomosis without leak or abscess.  Due to the persistent right upper quadrant and epigastric pain and patient with history of reflux and HIDA was done and was negative for acute cholecystitis.  Patient has not had any fever or tachycardia during the admission.  She has been able to ambulate.  She has had regular bowel movement.  She is tolerating solid diet.  Physical Exam  Constitutional: She is oriented to person, place, and time.  Cardiovascular: Normal rate and regular rhythm.  Pulmonary/Chest: Effort normal.  Abdominal: Soft. Bowel sounds are normal. She exhibits no distension.  Neurological: She is alert and oriented to person, place, and time.  Wounds area dry and clean.    Consults: Hospitalist  Disposition: Discharge disposition: 01-Home or Self Care       Discharge Instructions    Diet - low sodium  heart healthy   Complete by: As directed    Increase activity slowly   Complete by: As directed      Allergies as of 10/17/2018      Reactions   Oxycodone Other (See Comments)   Reaction:  Hallucinations   Morphine Other (See Comments)   Reaction:  Hallucinations      Medication List    TAKE these medications   acetaminophen 650 MG CR tablet Commonly known as: TYLENOL Take 650-1,300 mg by mouth every 8 (eight) hours as needed for pain. TYLENOL ARTHRITIS   albuterol 108 (90 Base) MCG/ACT inhaler Commonly known as: VENTOLIN HFA Inhale 2 puffs into the lungs every 6 (six) hours as needed for wheezing or shortness of breath.   amoxicillin 500 MG capsule Commonly known as: AMOXIL Take 2,000 mg by mouth. Prior to dental appointments   Eliquis 5 MG Tabs tablet Generic drug: apixaban TAKE 1 TABLET TWICE DAILY What changed: how much to take   escitalopram 10 MG tablet Commonly known as: LEXAPRO Take 10 mg by mouth daily.   gabapentin 300 MG capsule Commonly known as: NEURONTIN Take 300 mg by mouth 2 (two) times daily.   losartan-hydrochlorothiazide 100-12.5 MG tablet Commonly known as: HYZAAR Take 1 tablet by mouth daily. Reported on 08/22/2015   oxyCODONE 5 MG immediate release tablet Commonly known as: Oxy IR/ROXICODONE Take 1 tablet (5 mg total) by mouth every 4 (four) hours as needed for moderate pain or severe pain.   pantoprazole 40 MG tablet Commonly known as: PROTONIX Take 1 tablet (40 mg total) by mouth 2 (two) times daily  for 14 days. What changed: when to take this   pravastatin 40 MG tablet Commonly known as: PRAVACHOL Take 40 mg by mouth daily.   PRESERVISION AREDS 2 PO Take 1 capsule by mouth 2 (two) times daily.   propranolol 40 MG tablet Commonly known as: INDERAL Take 40 mg by mouth 2 (two) times a day.   sucralfate 1 GM/10ML suspension Commonly known as: CARAFATE Take 10 mLs (1 g total) by mouth 4 (four) times daily -  with meals and at  bedtime.   tiZANidine 4 MG tablet Commonly known as: ZANAFLEX Take 4 mg by mouth at bedtime as needed for muscle spasms.      Follow-up Information    Herbert Pun, MD Follow up in 1 week(s).   Specialty: General Surgery Contact information: [address] [place] [address] [phone]

## 2018-10-22 ENCOUNTER — Ambulatory Visit
Admission: RE | Admit: 2018-10-22 | Discharge: 2018-10-22 | Disposition: A | Discharge status: Home / Self Care | Payer: Medicare HMO | Source: Ambulatory Visit | Attending: Internal Medicine | Admitting: Internal Medicine

## 2018-10-22 ENCOUNTER — Other Ambulatory Visit: Payer: Self-pay

## 2018-10-22 DIAGNOSIS — Z1231 Encounter for screening mammogram for malignant neoplasm of breast: Secondary | ICD-10-CM | POA: Diagnosis not present

## 2018-11-02 DIAGNOSIS — K219 Gastro-esophageal reflux disease without esophagitis: Secondary | ICD-10-CM | POA: Diagnosis not present

## 2018-11-02 DIAGNOSIS — R1010 Upper abdominal pain, unspecified: Secondary | ICD-10-CM | POA: Diagnosis not present

## 2018-11-02 DIAGNOSIS — Z7901 Long term (current) use of anticoagulants: Secondary | ICD-10-CM | POA: Diagnosis not present

## 2018-11-02 DIAGNOSIS — R1310 Dysphagia, unspecified: Secondary | ICD-10-CM | POA: Diagnosis not present

## 2018-11-03 ENCOUNTER — Other Ambulatory Visit: Payer: Self-pay | Admitting: Gastroenterology

## 2018-11-03 DIAGNOSIS — K219 Gastro-esophageal reflux disease without esophagitis: Secondary | ICD-10-CM

## 2018-11-03 DIAGNOSIS — R1010 Upper abdominal pain, unspecified: Secondary | ICD-10-CM

## 2018-11-03 DIAGNOSIS — R1310 Dysphagia, unspecified: Secondary | ICD-10-CM

## 2018-11-04 DIAGNOSIS — E7800 Pure hypercholesterolemia, unspecified: Secondary | ICD-10-CM | POA: Diagnosis not present

## 2018-11-04 DIAGNOSIS — G4733 Obstructive sleep apnea (adult) (pediatric): Secondary | ICD-10-CM | POA: Diagnosis not present

## 2018-11-04 DIAGNOSIS — Z9989 Dependence on other enabling machines and devices: Secondary | ICD-10-CM | POA: Diagnosis not present

## 2018-11-04 DIAGNOSIS — I2692 Saddle embolus of pulmonary artery without acute cor pulmonale: Secondary | ICD-10-CM | POA: Diagnosis not present

## 2018-11-04 DIAGNOSIS — R739 Hyperglycemia, unspecified: Secondary | ICD-10-CM | POA: Diagnosis not present

## 2018-11-04 DIAGNOSIS — Z0000 Encounter for general adult medical examination without abnormal findings: Secondary | ICD-10-CM | POA: Diagnosis not present

## 2018-11-04 DIAGNOSIS — I129 Hypertensive chronic kidney disease with stage 1 through stage 4 chronic kidney disease, or unspecified chronic kidney disease: Secondary | ICD-10-CM | POA: Diagnosis not present

## 2018-11-04 DIAGNOSIS — F325 Major depressive disorder, single episode, in full remission: Secondary | ICD-10-CM | POA: Diagnosis not present

## 2018-11-16 DIAGNOSIS — H353130 Nonexudative age-related macular degeneration, bilateral, stage unspecified: Secondary | ICD-10-CM | POA: Diagnosis not present

## 2018-11-16 DIAGNOSIS — H524 Presbyopia: Secondary | ICD-10-CM | POA: Diagnosis not present

## 2018-11-16 DIAGNOSIS — H52209 Unspecified astigmatism, unspecified eye: Secondary | ICD-10-CM | POA: Diagnosis not present

## 2018-11-16 DIAGNOSIS — H5203 Hypermetropia, bilateral: Secondary | ICD-10-CM | POA: Diagnosis not present

## 2018-11-16 DIAGNOSIS — H2512 Age-related nuclear cataract, left eye: Secondary | ICD-10-CM | POA: Diagnosis not present

## 2018-11-25 DIAGNOSIS — L918 Other hypertrophic disorders of the skin: Secondary | ICD-10-CM | POA: Diagnosis not present

## 2018-11-30 ENCOUNTER — Other Ambulatory Visit: Payer: Self-pay

## 2018-11-30 ENCOUNTER — Ambulatory Visit: Admission: RE | Admit: 2018-11-30 | Payer: Medicare HMO | Source: Ambulatory Visit

## 2018-11-30 DIAGNOSIS — Z20822 Contact with and (suspected) exposure to covid-19: Secondary | ICD-10-CM

## 2018-12-01 LAB — NOVEL CORONAVIRUS, NAA: SARS-CoV-2, NAA: DETECTED — AB

## 2018-12-13 ENCOUNTER — Emergency Department: Payer: Medicare HMO

## 2018-12-13 ENCOUNTER — Other Ambulatory Visit: Payer: Self-pay

## 2018-12-13 ENCOUNTER — Emergency Department
Admission: EM | Admit: 2018-12-13 | Discharge: 2018-12-14 | Disposition: A | Discharge status: Other Acute Inpatient Hospital | Payer: Medicare HMO | Attending: Emergency Medicine | Admitting: Emergency Medicine

## 2018-12-13 DIAGNOSIS — Z859 Personal history of malignant neoplasm, unspecified: Secondary | ICD-10-CM | POA: Insufficient documentation

## 2018-12-13 DIAGNOSIS — E860 Dehydration: Secondary | ICD-10-CM

## 2018-12-13 DIAGNOSIS — R0902 Hypoxemia: Secondary | ICD-10-CM | POA: Diagnosis not present

## 2018-12-13 DIAGNOSIS — R531 Weakness: Secondary | ICD-10-CM | POA: Diagnosis not present

## 2018-12-13 DIAGNOSIS — R0602 Shortness of breath: Secondary | ICD-10-CM | POA: Diagnosis not present

## 2018-12-13 DIAGNOSIS — Z79899 Other long term (current) drug therapy: Secondary | ICD-10-CM | POA: Insufficient documentation

## 2018-12-13 DIAGNOSIS — U071 COVID-19: Secondary | ICD-10-CM | POA: Insufficient documentation

## 2018-12-13 DIAGNOSIS — I10 Essential (primary) hypertension: Secondary | ICD-10-CM | POA: Diagnosis not present

## 2018-12-13 DIAGNOSIS — R112 Nausea with vomiting, unspecified: Secondary | ICD-10-CM | POA: Diagnosis present

## 2018-12-13 DIAGNOSIS — Z20828 Contact with and (suspected) exposure to other viral communicable diseases: Secondary | ICD-10-CM | POA: Diagnosis not present

## 2018-12-13 DIAGNOSIS — Z7901 Long term (current) use of anticoagulants: Secondary | ICD-10-CM | POA: Insufficient documentation

## 2018-12-13 LAB — URINALYSIS, COMPLETE (UACMP) WITH MICROSCOPIC
Bilirubin Urine: NEGATIVE
Glucose, UA: NEGATIVE mg/dL
Hgb urine dipstick: NEGATIVE
Ketones, ur: NEGATIVE mg/dL
Leukocytes,Ua: NEGATIVE
Nitrite: NEGATIVE
Protein, ur: 30 mg/dL — AB
Specific Gravity, Urine: 1.025 (ref 1.005–1.030)
pH: 5 (ref 5.0–8.0)

## 2018-12-13 LAB — COMPREHENSIVE METABOLIC PANEL
ALT: 8 U/L (ref 0–44)
AST: 14 U/L — ABNORMAL LOW (ref 15–41)
Albumin: 2.8 g/dL — ABNORMAL LOW (ref 3.5–5.0)
Alkaline Phosphatase: 87 U/L (ref 38–126)
Anion gap: 13 (ref 5–15)
BUN: 12 mg/dL (ref 8–23)
CO2: 28 mmol/L (ref 22–32)
Calcium: 8.2 mg/dL — ABNORMAL LOW (ref 8.9–10.3)
Chloride: 98 mmol/L (ref 98–111)
Creatinine, Ser: 0.96 mg/dL (ref 0.44–1.00)
GFR calc Af Amer: >60 mL/min (ref >60)
GFR calc non Af Amer: 57 mL/min — ABNORMAL LOW (ref >60)
Glucose, Bld: 123 mg/dL — ABNORMAL HIGH (ref 70–99)
Potassium: 3.1 mmol/L — ABNORMAL LOW (ref 3.5–5.1)
Sodium: 139 mmol/L (ref 135–145)
Total Bilirubin: 0.4 mg/dL (ref 0.3–1.2)
Total Protein: 6.3 g/dL — ABNORMAL LOW (ref 6.5–8.1)

## 2018-12-13 LAB — CBC WITH DIFFERENTIAL/PLATELET
Abs Immature Granulocytes: 0.04 10*3/uL (ref 0.00–0.07)
Basophils Absolute: 0 10*3/uL (ref 0.0–0.1)
Basophils Relative: 0 %
Eosinophils Absolute: 0 10*3/uL (ref 0.0–0.5)
Eosinophils Relative: 0 %
HCT: 34.6 % — ABNORMAL LOW (ref 36.0–46.0)
Hemoglobin: 10.8 g/dL — ABNORMAL LOW (ref 12.0–15.0)
Immature Granulocytes: 0 %
Lymphocytes Relative: 37 %
Lymphs Abs: 3.6 10*3/uL (ref 0.7–4.0)
MCH: 28.7 pg (ref 26.0–34.0)
MCHC: 31.2 g/dL (ref 30.0–36.0)
MCV: 92 fL (ref 80.0–100.0)
Monocytes Absolute: 0.9 10*3/uL (ref 0.1–1.0)
Monocytes Relative: 9 %
Neutro Abs: 5.3 10*3/uL (ref 1.7–7.7)
Neutrophils Relative %: 54 %
Platelets: 267 10*3/uL (ref 150–400)
RBC: 3.76 MIL/uL — ABNORMAL LOW (ref 3.87–5.11)
RDW: 14.6 % (ref 11.5–15.5)
WBC: 9.9 10*3/uL (ref 4.0–10.5)
nRBC: 0 % (ref 0.0–0.2)

## 2018-12-13 LAB — LACTIC ACID, PLASMA: Lactic Acid, Venous: 0.9 mmol/L (ref 0.5–1.9)

## 2018-12-13 MED ORDER — ONDANSETRON HCL 4 MG/2ML IJ SOLN
4.0000 mg | Freq: Once | INTRAMUSCULAR | Status: AC
  Administered 2018-12-13: 4 mg via INTRAVENOUS

## 2018-12-13 MED ORDER — ONDANSETRON HCL 4 MG/2ML IJ SOLN
INTRAMUSCULAR | Status: AC
  Administered 2018-12-13: 4 mg via INTRAVENOUS
  Filled 2018-12-13: qty 2

## 2018-12-13 MED ORDER — POTASSIUM CHLORIDE CRYS ER 20 MEQ PO TBCR
40.0000 meq | EXTENDED_RELEASE_TABLET | Freq: Once | ORAL | Status: AC
  Administered 2018-12-13: 40 meq via ORAL
  Filled 2018-12-13: qty 2

## 2018-12-13 MED ORDER — ACETAMINOPHEN 325 MG PO TABS
650.0000 mg | ORAL_TABLET | Freq: Once | ORAL | Status: AC
  Administered 2018-12-13: 650 mg via ORAL

## 2018-12-13 MED ORDER — SODIUM CHLORIDE 0.9 % IV SOLN
Freq: Once | INTRAVENOUS | Status: AC
  Administered 2018-12-13: 17:00:00 via INTRAVENOUS

## 2018-12-13 MED ORDER — SODIUM CHLORIDE 0.9 % IV SOLN
2.0000 g | Freq: Once | INTRAVENOUS | Status: AC
  Administered 2018-12-13: 2 g via INTRAVENOUS
  Filled 2018-12-13: qty 20

## 2018-12-13 MED ORDER — ACETAMINOPHEN 325 MG PO TABS
ORAL_TABLET | ORAL | Status: AC
  Administered 2018-12-13: 19:00:00 650 mg via ORAL
  Filled 2018-12-13: qty 2

## 2018-12-13 NOTE — ED Notes (Addendum)
Pt c/o HA. Pt given brief update. Pt given cup of water and saltine crackers verbal okay from [place].

## 2018-12-13 NOTE — ED Notes (Signed)
Pt assisted to bedside toilet. Pt c/o nausea.

## 2018-12-13 NOTE — ED Notes (Signed)
EDP Isaacs at bedside 

## 2018-12-13 NOTE — ED Notes (Signed)
Pt placed on 2L O2 via [place]. Pt states SOB not necessarily worse. Pt states she is just uncomfortable.

## 2018-12-13 NOTE — Plan of Care (Signed)
[place] doc note:  78 yo F with COVID, daughter just discharged with COVID.  Patient had positive COVID test x13 days ago (in [facility]).  Had test due to being exposed to daughter though was mostly asymptomatic at that time.  Since then however she has had progressive generalized weakness, nausea, diarrhea, some SOB as well.  In ED today with primarily GI symptoms.  Does have some SOB and dyspnea with O2 sats of ~90% on RA, they put her on 2L [place] for comfort, CXR neg.  Requesting transfer to [facility].  Will accept to med-surg here at [facility].

## 2018-12-13 NOTE — ED Notes (Signed)
[facility] called for transfer [phone]

## 2018-12-13 NOTE — ED Provider Notes (Signed)
[facility] Emergency Department Provider Note  ____________________________________________   First MD Initiated Contact with Patient 12/13/18 1642     (approximate)  I have reviewed the triage vital signs and the nursing notes.   HISTORY  Chief Complaint Generalized Body Aches    HPI Jamie Phillips is a 78 y.o. female  Here with n/v/d, weakness, SOB. Pt was ust diagnosed with COVID-19 on 8/10. She had been with her daughter, who was just release from [facility] for COVID, prior to this and was being tested as a precautions. Approximately 4-5 days ago, she began to develop chills, nausea, vomiting, and loose diarrhea. She has since had persistent weakness and loss of appetite, as well as mild SOB though no cough. She states she's been essentially unable to keep anythign down for the past 2 days and has been having difficulty just getting around the house. No known fevers, but she takes tylenol around the clock for her arthritis. No urinary sx. No leg swelling. No rash.       Past Medical History:  Diagnosis Date  . Anginal pain ([place])    atypical chest pain  . Anxiety   . Arthritis   . Back pain   . Cancer ([place])    cll  . Chronic kidney disease   . CLL (chronic lymphocytic leukemia) ([place])   . Depression   . GERD (gastroesophageal reflux disease)   . Headache   . Hemorrhoids   . Hypercholesteremia   . Hyperlipidemia   . Hypertension   . 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 Macular degeneration   . MGUS (monoclonal gammopathy of unknown significance)   . Obesity   . Pulmonary embolism ([place])   . PVD (peripheral vascular disease) ([place])   . Sleep apnea     Patient Active Problem List   Diagnosis Date Noted  . Right lower quadrant pain 10/09/2018  . Calculus of gallbladder without cholecystitis without obstruction 08/27/2016  . 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 History of DVT in adulthood 03/04/2016  . DJD  (degenerative joint disease) 03/04/2016  . Pain in limb 03/04/2016  . Venous insufficiency 03/04/2016  . CLL (chronic lymphocytic leukemia) ([place]) 11/21/2015  . Acute respiratory failure ([place]) 08/14/2015  . Acute bronchitis 05/11/2015  . Essential hypertension 05/04/2015  . Hyperlipidemia 05/04/2015  . Pain in the chest 05/04/2015  . SOB (shortness of breath) 05/04/2015    Past Surgical History:  Procedure Laterality Date  . BREAST BIOPSY Left 08/19/04   lt bx/clip-neg  . COLONOSCOPY    . COLONOSCOPY WITH PROPOFOL N/A 03/10/2017   Procedure: COLONOSCOPY WITH PROPOFOL;  Surgeon: [name], MD;  Location: [facility] ENDOSCOPY;  Service: Endoscopy;  Laterality: N/A;  . IVC FILTER INSERTION N/A 06/11/2016   Procedure: IVC Filter Insertion;  Surgeon: Katha Cabal, MD;  Location: [place] CV LAB;  Service: Cardiovascular;  Laterality: N/A;  . IVC FILTER REMOVAL N/A 05/06/2017   Procedure: IVC FILTER REMOVAL;  Surgeon: Katha Cabal, MD;  Location: [place] CV LAB;  Service: Cardiovascular;  Laterality: N/A;  . JOINT REPLACEMENT Left 06/17/2016  . LAPAROSCOPIC APPENDECTOMY N/A 10/10/2018   Procedure: APPENDECTOMY LAPAROSCOPIC ATTEMPTED CONVERTED TO OPEN;  Surgeon: Herbert Pun, MD;  Location: [facility] ORS;  Service: General;  Laterality: N/A;  . LAPAROSCOPY N/A 10/10/2018   Procedure: LAPAROSCOPY DIAGNOSTIC ATTEMPTED CONVERTED TO OPEN;  Surgeon: Herbert Pun, MD;  Location: [facility] ORS;  Service: General;  Laterality: N/A;  .  neck fusion    . PARTIAL COLECTOMY Right 10/10/2018   Procedure: PARTIAL COLECTOMY;  Surgeon: Herbert Pun, MD;  Location: [facility] ORS;  Service: General;  Laterality: Right;  . PERIPHERAL VASCULAR CATHETERIZATION N/A 08/15/2015   Procedure: pulmonary angiogram with lysis;  Surgeon: Katha Cabal, MD;  Location: [place] CV LAB;  Service: Cardiovascular;  Laterality: N/A;  . WRIST SURGERY Left 2000    Prior to Admission  medications   Medication Sig Start Date End Date Taking? Authorizing Provider  acetaminophen (TYLENOL) 650 MG CR tablet Take 650-1,300 mg by mouth every 8 (eight) hours as needed for pain. TYLENOL ARTHRITIS    [provider]  albuterol (PROVENTIL HFA;VENTOLIN HFA) 108 (90 Base) MCG/ACT inhaler Inhale 2 puffs into the lungs every 6 (six) hours as needed for wheezing or shortness of breath.    [provider]  amoxicillin (AMOXIL) 500 MG capsule Take 2,000 mg by mouth. Prior to dental appointments 04/23/18   [provider]  ELIQUIS 5 MG TABS tablet TAKE 1 TABLET TWICE DAILY Patient taking differently: Take 5 mg by mouth 2 (two) times daily.  01/03/16   Cammie Sickle, MD  escitalopram (LEXAPRO) 10 MG tablet Take 10 mg by mouth daily.    [provider]  gabapentin (NEURONTIN) 300 MG capsule Take 300 mg by mouth 2 (two) times daily.     [provider]  losartan-hydrochlorothiazide (HYZAAR) 100-12.5 MG tablet Take 1 tablet by mouth daily. Reported on 08/22/2015    [provider]  Multiple Vitamins-Minerals (PRESERVISION AREDS 2 PO) Take 1 capsule by mouth 2 (two) times daily.     [provider]  oxyCODONE (OXY IR/ROXICODONE) 5 MG immediate release tablet Take 1 tablet (5 mg total) by mouth every 4 (four) hours as needed for moderate pain or severe pain. 10/17/18   Herbert Pun, MD  pantoprazole (PROTONIX) 40 MG tablet Take 1 tablet (40 mg total) by mouth 2 (two) times daily for 14 days. 10/17/18 10/31/18  Herbert Pun, MD  pravastatin (PRAVACHOL) 40 MG tablet Take 40 mg by mouth daily.     [provider]  propranolol (INDERAL) 40 MG tablet Take 40 mg by mouth 2 (two) times a day.    [provider]  sucralfate (CARAFATE) 1 GM/10ML suspension Take 10 mLs (1 g total) by mouth 4 (four) times daily -  with meals and at bedtime. 10/17/18   Herbert Pun, MD  tiZANidine (ZANAFLEX) 4 MG tablet Take 4  mg by mouth at bedtime as needed for muscle spasms.     [provider]    Allergies Oxycodone and Morphine  Family History  Problem Relation Age of Onset  . Heart failure Father   . Congestive Heart Failure Father   . Pulmonary embolism Mother   . Breast cancer Neg Hx     Social History Social History   Tobacco Use  . Smoking status: Never Smoker  . Smokeless tobacco: Never Used  Substance Use Topics  . Alcohol use: No    Alcohol/week: 0.0 standard drinks  . Drug use: No    Review of Systems  Review of Systems  Constitutional: Positive for fatigue. Negative for fever.  HENT: Negative for congestion and sore throat.   Eyes: Negative for visual disturbance.  Respiratory: Negative for cough and shortness of breath.   Cardiovascular: Negative for chest pain.  Gastrointestinal: Positive for abdominal pain, diarrhea, nausea and vomiting.  Genitourinary: Negative for flank pain.  Musculoskeletal: Negative for  back pain and neck pain.  Skin: Negative for rash and wound.  Neurological: Positive for weakness.  All other systems reviewed and are negative.    ____________________________________________  PHYSICAL EXAM:      VITAL SIGNS: ED Triage Vitals  Enc Vitals Group     BP 12/13/18 1626 (!) 105/56     Pulse Rate 12/13/18 1627 62     Resp 12/13/18 1626 20     Temp 12/13/18 1625 98.2 F (36.8 C)     Temp Source 12/13/18 1625 Oral     SpO2 12/13/18 1627 92 %     Weight 12/13/18 1626 260 lb (117.9 kg)     Height 12/13/18 1626 5\' 5"  (1.651 m)     Head Circumference --      Peak Flow --      Pain Score 12/13/18 1625 5     Pain Loc --      Pain Edu? --      Excl. in [place]? --      Physical Exam Vitals signs and nursing note reviewed.  Constitutional:      General: She is not in acute distress.    Appearance: She is well-developed.  HENT:     Head: Normocephalic and atraumatic.     Mouth/Throat:     Mouth: Mucous membranes are dry.  Eyes:      Conjunctiva/sclera: Conjunctivae normal.  Neck:     Musculoskeletal: Neck supple.  Cardiovascular:     Rate and Rhythm: Normal rate and regular rhythm.     Heart sounds: Normal heart sounds. No murmur. No friction rub.  Pulmonary:     Effort: Pulmonary effort is normal. Tachypnea present. No respiratory distress.     Breath sounds: Decreased air movement present. Rhonchi present. No wheezing or rales.  Abdominal:     General: There is no distension.     Palpations: Abdomen is soft.     Tenderness: There is no abdominal tenderness.     Comments: Increased bowel sounds  Skin:    General: Skin is warm.     Capillary Refill: Capillary refill takes less than 2 seconds.  Neurological:     Mental Status: She is alert and oriented to person, place, and time.     Motor: No abnormal muscle tone.       ____________________________________________   LABS (all labs ordered are listed, but only abnormal results are displayed)  Labs Reviewed  CBC WITH DIFFERENTIAL/PLATELET - Abnormal; Notable for the following components:      Result Value   RBC 3.76 (*)    Hemoglobin 10.8 (*)    HCT 34.6 (*)    All other components within normal limits  COMPREHENSIVE METABOLIC PANEL - Abnormal; Notable for the following components:   Potassium 3.1 (*)    Glucose, Bld 123 (*)    Calcium 8.2 (*)    Total Protein 6.3 (*)    Albumin 2.8 (*)    AST 14 (*)    GFR calc non Af Amer 57 (*)    All other components within normal limits  URINALYSIS, COMPLETE (UACMP) WITH MICROSCOPIC - Abnormal; Notable for the following components:   Color, Urine YELLOW (*)    APPearance CLOUDY (*)    Protein, ur 30 (*)    Bacteria, UA MANY (*)    All other components within normal limits  URINE CULTURE  LACTIC ACID, PLASMA    ____________________________________________  EKG: NSR, VR 62. Nonspecific IVCD. QTc 470. No acute  ischemic changes. ________________________________________  RADIOLOGY All imaging, including  plain films, CT scans, and ultrasounds, independently reviewed by me, and interpretations confirmed via formal radiology reads.  ED MD interpretation:   CXR: bronchitic changes  Official radiology report(s): Dg Chest Portable 1 View  Result Date: 12/13/2018 CLINICAL DATA:  Shortness of breath, weakness EXAM: PORTABLE CHEST 1 VIEW COMPARISON:  10/09/2018 FINDINGS: Heart is upper limits normal in size. Mild peribronchial thickening and interstitial prominence is similar to prior study. No effusions or acute bony abnormality. IMPRESSION: Mild bronchitic changes. Electronically Signed   By: Rolm Baptise M.D.   On: 12/13/2018 17:14    ____________________________________________  PROCEDURES   Procedure(s) performed (including Critical Care):  Procedures  ____________________________________________  INITIAL IMPRESSION / MDM / [place] / ED COURSE  As part of my medical decision making, I reviewed the following data within the electronic MEDICAL RECORD NUMBER Notes from prior ED visits and [place] Controlled Substance Database      *SHAKEILA MARCHITTO was evaluated in Emergency Department on 12/13/2018 for the symptoms described in the history of present illness. She was evaluated in the context of the global COVID-19 pandemic, which necessitated consideration that the patient might be at risk for infection with the SARS-CoV-2 virus that causes COVID-19. Institutional protocols and algorithms that pertain to the evaluation of patients at risk for COVID-19 are in a state of rapid change based on information released by regulatory bodies including the CDC and federal and state organizations. These policies and algorithms were followed during the patient's care in the ED.  Some ED evaluations and interventions may be delayed as a result of limited staffing during the pandemic.*      Medical Decision Making: 78 yo F here with suspected symptomatic COVID-19. She was known positive on 8/10 with  likely exposure being her daughter, who was just discharged from [facility]. Abdomens soft, no focal TTP to suggest surgical pathology. Labs show hypokalemia likely 2/2 dehydration. No overt leukopenia. CXR cw/ bronchitis. Pt noted to be mildly hypoxic here, 88% on RA with position changes, worse with movement. Suspect this is all secondary to COVID. Do not feel CT imaging of abdomen needed in absence of any TTP. Admit to [facility].  ____________________________________________  FINAL CLINICAL IMPRESSION(S) / ED DIAGNOSES  Final diagnoses:  COVID-19  Dehydration     MEDICATIONS GIVEN DURING THIS VISIT:  Medications  cefTRIAXone (ROCEPHIN) 2 g in sodium chloride 0.9 % 100 mL IVPB (has no administration in time range)  0.9 %  sodium chloride infusion ( Intravenous New Bag/Given 12/13/18 1658)  potassium chloride SA (K-DUR) CR tablet 40 mEq (40 mEq Oral Given 12/13/18 1920)  acetaminophen (TYLENOL) tablet 650 mg (650 mg Oral Given 12/13/18 1920)     ED Discharge Orders    None       Note:  This document was prepared using Dragon voice recognition software and may include unintentional dictation errors.   Duffy Bruce, MD 12/13/18 2055

## 2018-12-13 NOTE — ED Notes (Signed)
Pt resting. Bed locked low. Rail up. Call bell within reach.

## 2018-12-13 NOTE — ED Notes (Signed)
Pt given more warm blankets.  

## 2018-12-13 NOTE — ED Triage Notes (Signed)
Pt in via EMS from home d/t SOB and " not feeling well." Pt tested covid + 1-2 weeks ago per pt. VSS.

## 2018-12-13 NOTE — ED Notes (Signed)
Pt given pillow; attempting for IV access now.

## 2018-12-13 NOTE — ED Notes (Signed)
Blue/grn/lav/lactic sent to lab. Lactic on ice.

## 2018-12-13 NOTE — ED Notes (Signed)
Pt understands urine sample will be needed.

## 2018-12-13 NOTE — ED Notes (Signed)
Pt attempting to provide urine sample

## 2018-12-14 ENCOUNTER — Encounter (HOSPITAL_COMMUNITY): Payer: Self-pay

## 2018-12-14 ENCOUNTER — Other Ambulatory Visit: Payer: Self-pay

## 2018-12-14 ENCOUNTER — Inpatient Hospital Stay (HOSPITAL_COMMUNITY)
Admission: AD | Admit: 2018-12-14 | Discharge: 2018-12-20 | DRG: 177 | Disposition: A | Discharge status: Home Health | Payer: Medicare HMO | Source: Other Acute Inpatient Hospital | Attending: Internal Medicine | Admitting: Internal Medicine

## 2018-12-14 DIAGNOSIS — R001 Bradycardia, unspecified: Secondary | ICD-10-CM | POA: Diagnosis present

## 2018-12-14 DIAGNOSIS — E876 Hypokalemia: Secondary | ICD-10-CM | POA: Diagnosis present

## 2018-12-14 DIAGNOSIS — I2782 Chronic pulmonary embolism: Secondary | ICD-10-CM

## 2018-12-14 DIAGNOSIS — Z981 Arthrodesis status: Secondary | ICD-10-CM | POA: Diagnosis not present

## 2018-12-14 DIAGNOSIS — U071 COVID-19: Secondary | ICD-10-CM | POA: Diagnosis not present

## 2018-12-14 DIAGNOSIS — Z856 Personal history of leukemia: Secondary | ICD-10-CM | POA: Diagnosis not present

## 2018-12-14 DIAGNOSIS — M1990 Unspecified osteoarthritis, unspecified site: Secondary | ICD-10-CM | POA: Diagnosis present

## 2018-12-14 DIAGNOSIS — J90 Pleural effusion, not elsewhere classified: Secondary | ICD-10-CM | POA: Diagnosis not present

## 2018-12-14 DIAGNOSIS — Z6841 Body Mass Index (BMI) 40.0 and over, adult: Secondary | ICD-10-CM | POA: Diagnosis not present

## 2018-12-14 DIAGNOSIS — Z66 Do not resuscitate: Secondary | ICD-10-CM | POA: Diagnosis present

## 2018-12-14 DIAGNOSIS — E785 Hyperlipidemia, unspecified: Secondary | ICD-10-CM | POA: Diagnosis present

## 2018-12-14 DIAGNOSIS — Z8249 Family history of ischemic heart disease and other diseases of the circulatory system: Secondary | ICD-10-CM

## 2018-12-14 DIAGNOSIS — J988 Other specified respiratory disorders: Secondary | ICD-10-CM

## 2018-12-14 DIAGNOSIS — H3530 Unspecified macular degeneration: Secondary | ICD-10-CM | POA: Diagnosis present

## 2018-12-14 DIAGNOSIS — D472 Monoclonal gammopathy: Secondary | ICD-10-CM | POA: Diagnosis present

## 2018-12-14 DIAGNOSIS — K219 Gastro-esophageal reflux disease without esophagitis: Secondary | ICD-10-CM | POA: Diagnosis present

## 2018-12-14 DIAGNOSIS — N390 Urinary tract infection, site not specified: Secondary | ICD-10-CM | POA: Diagnosis present

## 2018-12-14 DIAGNOSIS — I129 Hypertensive chronic kidney disease with stage 1 through stage 4 chronic kidney disease, or unspecified chronic kidney disease: Secondary | ICD-10-CM | POA: Diagnosis present

## 2018-12-14 DIAGNOSIS — J1289 Other viral pneumonia: Secondary | ICD-10-CM | POA: Diagnosis not present

## 2018-12-14 DIAGNOSIS — I2692 Saddle embolus of pulmonary artery without acute cor pulmonale: Secondary | ICD-10-CM | POA: Diagnosis present

## 2018-12-14 DIAGNOSIS — I10 Essential (primary) hypertension: Secondary | ICD-10-CM

## 2018-12-14 DIAGNOSIS — G4730 Sleep apnea, unspecified: Secondary | ICD-10-CM | POA: Diagnosis present

## 2018-12-14 DIAGNOSIS — K529 Noninfective gastroenteritis and colitis, unspecified: Secondary | ICD-10-CM | POA: Diagnosis present

## 2018-12-14 DIAGNOSIS — I739 Peripheral vascular disease, unspecified: Secondary | ICD-10-CM | POA: Diagnosis present

## 2018-12-14 DIAGNOSIS — R0902 Hypoxemia: Secondary | ICD-10-CM

## 2018-12-14 DIAGNOSIS — E669 Obesity, unspecified: Secondary | ICD-10-CM | POA: Diagnosis present

## 2018-12-14 DIAGNOSIS — J40 Bronchitis, not specified as acute or chronic: Secondary | ICD-10-CM | POA: Diagnosis not present

## 2018-12-14 DIAGNOSIS — J9811 Atelectasis: Secondary | ICD-10-CM | POA: Diagnosis not present

## 2018-12-14 DIAGNOSIS — Z79899 Other long term (current) drug therapy: Secondary | ICD-10-CM

## 2018-12-14 DIAGNOSIS — E860 Dehydration: Secondary | ICD-10-CM | POA: Diagnosis present

## 2018-12-14 DIAGNOSIS — N182 Chronic kidney disease, stage 2 (mild): Secondary | ICD-10-CM | POA: Diagnosis not present

## 2018-12-14 DIAGNOSIS — J9601 Acute respiratory failure with hypoxia: Secondary | ICD-10-CM | POA: Diagnosis present

## 2018-12-14 DIAGNOSIS — Z7901 Long term (current) use of anticoagulants: Secondary | ICD-10-CM

## 2018-12-14 DIAGNOSIS — Z885 Allergy status to narcotic agent status: Secondary | ICD-10-CM

## 2018-12-14 LAB — ABO/RH: ABO/RH(D): A POS

## 2018-12-14 LAB — C-REACTIVE PROTEIN: CRP: 22.3 mg/dL — ABNORMAL HIGH (ref <1.0)

## 2018-12-14 LAB — MAGNESIUM: Magnesium: 1.8 mg/dL (ref 1.7–2.4)

## 2018-12-14 MED ORDER — PANTOPRAZOLE SODIUM 40 MG PO TBEC
40.0000 mg | DELAYED_RELEASE_TABLET | Freq: Every day | ORAL | Status: DC
  Administered 2018-12-14 – 2018-12-20 (×7): 40 mg via ORAL
  Filled 2018-12-14 (×7): qty 1

## 2018-12-14 MED ORDER — APIXABAN 5 MG PO TABS
5.0000 mg | ORAL_TABLET | Freq: Two times a day (BID) | ORAL | Status: DC
  Administered 2018-12-14 – 2018-12-20 (×14): 5 mg via ORAL
  Filled 2018-12-14 (×14): qty 1

## 2018-12-14 MED ORDER — ACETAMINOPHEN 500 MG PO TABS
1000.0000 mg | ORAL_TABLET | Freq: Three times a day (TID) | ORAL | Status: DC | PRN
  Administered 2018-12-14 – 2018-12-16 (×3): 1000 mg via ORAL
  Filled 2018-12-14 (×3): qty 2

## 2018-12-14 MED ORDER — HYDRALAZINE HCL 20 MG/ML IJ SOLN
10.0000 mg | Freq: Four times a day (QID) | INTRAMUSCULAR | Status: DC | PRN
  Administered 2018-12-16 – 2018-12-17 (×2): 10 mg via INTRAVENOUS
  Filled 2018-12-14 (×2): qty 1

## 2018-12-14 MED ORDER — HYDROCOD POLST-CPM POLST ER 10-8 MG/5ML PO SUER
5.0000 mL | Freq: Two times a day (BID) | ORAL | Status: DC | PRN
  Administered 2018-12-20: 5 mL via ORAL
  Filled 2018-12-14: qty 5

## 2018-12-14 MED ORDER — GABAPENTIN 300 MG PO CAPS
300.0000 mg | ORAL_CAPSULE | Freq: Two times a day (BID) | ORAL | Status: DC
  Administered 2018-12-14 – 2018-12-20 (×13): 300 mg via ORAL
  Filled 2018-12-14 (×13): qty 1

## 2018-12-14 MED ORDER — NYSTATIN 100000 UNIT/GM EX POWD
Freq: Three times a day (TID) | CUTANEOUS | Status: DC
  Administered 2018-12-14 – 2018-12-20 (×19): via TOPICAL
  Filled 2018-12-14: qty 15

## 2018-12-14 MED ORDER — SODIUM CHLORIDE 0.9 % IV SOLN
2.0000 g | INTRAVENOUS | Status: AC
  Administered 2018-12-14 – 2018-12-16 (×3): 2 g via INTRAVENOUS
  Filled 2018-12-14 (×3): qty 20

## 2018-12-14 MED ORDER — ALBUTEROL SULFATE HFA 108 (90 BASE) MCG/ACT IN AERS
2.0000 | INHALATION_SPRAY | Freq: Four times a day (QID) | RESPIRATORY_TRACT | Status: DC
  Administered 2018-12-14 – 2018-12-20 (×26): 2 via RESPIRATORY_TRACT
  Filled 2018-12-14: qty 6.7

## 2018-12-14 MED ORDER — PROPRANOLOL HCL 40 MG PO TABS
40.0000 mg | ORAL_TABLET | Freq: Two times a day (BID) | ORAL | Status: DC
  Administered 2018-12-14 – 2018-12-15 (×4): 40 mg via ORAL
  Filled 2018-12-14 (×8): qty 1

## 2018-12-14 MED ORDER — SUCRALFATE 1 GM/10ML PO SUSP
1.0000 g | Freq: Three times a day (TID) | ORAL | Status: DC
  Administered 2018-12-14 – 2018-12-20 (×27): 1 g via ORAL
  Filled 2018-12-14 (×31): qty 10

## 2018-12-14 MED ORDER — GUAIFENESIN-DM 100-10 MG/5ML PO SYRP: 10.0000 mL | ORAL_SOLUTION | ORAL | Status: DC | PRN

## 2018-12-14 MED ORDER — PRAVASTATIN SODIUM 40 MG PO TABS
40.0000 mg | ORAL_TABLET | Freq: Every day | ORAL | Status: DC
  Administered 2018-12-14 – 2018-12-20 (×7): 40 mg via ORAL
  Filled 2018-12-14 (×7): qty 1

## 2018-12-14 MED ORDER — SODIUM CHLORIDE 0.9 % IV SOLN
INTRAVENOUS | Status: DC
  Administered 2018-12-14: 03:00:00 via INTRAVENOUS

## 2018-12-14 MED ORDER — DEXAMETHASONE 6 MG PO TABS
6.0000 mg | ORAL_TABLET | Freq: Every day | ORAL | Status: DC
  Administered 2018-12-14 – 2018-12-16 (×3): 6 mg via ORAL
  Filled 2018-12-14 (×4): qty 1

## 2018-12-14 MED ORDER — ONDANSETRON HCL 4 MG PO TABS: 4.0000 mg | ORAL_TABLET | Freq: Four times a day (QID) | ORAL | Status: DC | PRN

## 2018-12-14 MED ORDER — POTASSIUM CHLORIDE CRYS ER 20 MEQ PO TBCR
20.0000 meq | EXTENDED_RELEASE_TABLET | Freq: Once | ORAL | Status: AC
  Administered 2018-12-14: 20 meq via ORAL
  Filled 2018-12-14: qty 1

## 2018-12-14 MED ORDER — PANTOPRAZOLE SODIUM 40 MG PO TBEC: 40.0000 mg | DELAYED_RELEASE_TABLET | Freq: Two times a day (BID) | ORAL | Status: DC

## 2018-12-14 MED ORDER — ONDANSETRON HCL 4 MG/2ML IJ SOLN: 4.0000 mg | Freq: Four times a day (QID) | INTRAMUSCULAR | Status: DC | PRN

## 2018-12-14 MED ORDER — POTASSIUM CHLORIDE CRYS ER 20 MEQ PO TBCR
40.0000 meq | EXTENDED_RELEASE_TABLET | Freq: Once | ORAL | Status: AC
  Administered 2018-12-14: 40 meq via ORAL
  Filled 2018-12-14: qty 2

## 2018-12-14 MED ORDER — ACETAMINOPHEN 325 MG PO TABS
650.0000 mg | ORAL_TABLET | Freq: Four times a day (QID) | ORAL | Status: DC | PRN
  Administered 2018-12-14 (×2): 650 mg via ORAL
  Filled 2018-12-14 (×2): qty 2

## 2018-12-14 MED ORDER — LACTATED RINGERS IV SOLN
INTRAVENOUS | Status: AC
  Administered 2018-12-14: 09:00:00 via INTRAVENOUS

## 2018-12-14 MED ORDER — ESCITALOPRAM OXALATE 10 MG PO TABS
10.0000 mg | ORAL_TABLET | Freq: Every day | ORAL | Status: DC
  Administered 2018-12-14 – 2018-12-20 (×7): 10 mg via ORAL
  Filled 2018-12-14 (×7): qty 1

## 2018-12-14 MED ORDER — TIZANIDINE HCL 4 MG PO TABS
4.0000 mg | ORAL_TABLET | Freq: Every evening | ORAL | Status: DC | PRN
  Administered 2018-12-14 – 2018-12-17 (×4): 4 mg via ORAL
  Filled 2018-12-14 (×6): qty 1

## 2018-12-14 MED ORDER — LACTATED RINGERS IV SOLN
INTRAVENOUS | Status: DC
  Administered 2018-12-14: 09:00:00 via INTRAVENOUS

## 2018-12-14 NOTE — Discharge Instructions (Signed)
° °

## 2018-12-14 NOTE — Progress Notes (Signed)
Pt ambulated for 3rd time today.  Pt on 2 L [place].  Pt's o2 before ambulation 95% on 2 L [place].  Pt's o2 post ambulation was 94% on 2 L [place].  Pt denied shortness of breath, dizziness, and lightheadedness before, during, and after ambulation.  Will continue to monitor.

## 2018-12-14 NOTE — Progress Notes (Signed)
PROGRESS NOTE                                                                                                                                                                                                             Patient Demographics:    Jamie Phillips, is a 78 y.o. female, DOB - 10-28-1940, HUD:[id number]  Outpatient Primary MD for the patient is Kirk Ruths, MD    LOS - 1  Admit date - 12/14/2018    CC - Weakness     Brief Narrative  Jamie Phillips is a 78 y.o. 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  Patient's daughter was just discharged from [facility] for [place], patient had been with daughter.  When daughter was found to have COVID, patient got tested (13 days ago on 8/10) and her COVID came back positive.  She was initially asymptomatic, but over the past 1 week has developed chills, N/V/D, generalized weakness, loss of appetite.   Subjective:    Carra Brindley today has, No headache, No chest pain, No abdominal pain - No Nausea, No new weakness tingling or numbness, no Cough - SOB.     Assessment  & Plan :     1. Acute Covid 19 Viral Infection with predominantly gastroenteritis, dehydration and weakness symptoms -no evidence of pneumonia clinically, currently not short of breath, hydrate, monitor inflammatory markers and clinically, PT and OT evaluation.  Agree with oral steroids for now will monitor closely.  COVID-19 Labs  No results for input(s): DDIMER, FERRITIN, LDH, CRP in the last 72 hours.  Lab Results  Component Value Date   SARSCOV2NAA Detected (A) 11/30/2018   [place] NEGATIVE 10/09/2018     Hepatic Function Latest Ref Rng & Units 12/13/2018 10/13/2018 10/09/2018  Total Protein 6.5 - 8.1 g/dL 6.3(L) 5.4(L) 6.4(L)  Albumin 3.5 - 5.0 g/dL 2.8(L) 2.5(L) 3.8  AST 15 - 41 U/L 14(L) 14(L) 16  ALT 0 - 44 U/L '8 9 13  ' Alk  Phosphatase 38 - 126 U/L 87 92 97  Total Bilirubin 0.3 - 1.2 mg/dL 0.4 0.5 0.7  Bilirubin, Direct 0.1 - 0.5 mg/dL - - -        Component Value Date/Time   BNP 463.0 (H) 08/14/2015 1753      2.  History of saddle PE.  On Eliquis.  3.  Dehydration with hypokalemia.  Potassium replaced.  Gentle hydration with IV fluids.  4.  Essential hypertension.  Currently on oral beta-blocker.  Blood pressure stable will monitor add PRN hydralazine.  ARB and HCTZ currently on hold.   5. UTI - Rocephin x 3 days.   Condition - Fair  Family Communication  :  Son on 8/24  Code Status :  DNR  Diet :   Diet Order            Diet Heart Room service appropriate? Yes; Fluid consistency: Thin  Diet effective now               Disposition Plan  :  HHPT  Consults  :  None  Procedures  :    PUD Prophylaxis : PPI  DVT Prophylaxis  :  Eliquis  Lab Results  Component Value Date   PLT 267 12/13/2018    Inpatient Medications  Scheduled Meds: . albuterol  2 puff Inhalation Q6H  . apixaban  5 mg Oral BID  . dexamethasone  6 mg Oral Daily  . escitalopram  10 mg Oral Daily  . gabapentin  300 mg Oral BID  . pantoprazole  40 mg Oral Daily  . potassium chloride  20 mEq Oral Once  . pravastatin  40 mg Oral Daily  . propranolol  40 mg Oral BID  . sucralfate  1 g Oral TID WC & HS   Continuous Infusions: . lactated ringers 75 mL/hr at 12/14/18 0835   PRN Meds:.acetaminophen, chlorpheniramine-HYDROcodone, guaiFENesin-dextromethorphan, [DISCONTINUED] ondansetron **OR** ondansetron (ZOFRAN) IV, tiZANidine  Antibiotics  :    Anti-infectives (From admission, onward)   None       Time Spent in minutes  30   Lala Lund M.D on 12/14/2018 at 9:00 AM  To page go to [email or web address] - password [email or web address]  [facility] -  Office  [phone]   See all Orders from today for further details    Objective:   Vitals:   12/14/18 0300 12/14/18 0315 12/14/18 0340 12/14/18 0753  BP:  139/65 (!) 144/94  121/66  Pulse: 70 69  67  Resp: (!) 32 (!) 25  (!) 22  Temp:  98.2 F (36.8 C)    TempSrc:  Oral    SpO2: 93% 95%  95%  Weight:   116.4 kg   Height:   '5\' 5"'  (1.651 m)     Wt Readings from Last 3 Encounters:  12/14/18 116.4 kg  12/13/18 117.9 kg  10/09/18 122 kg     Intake/Output Summary (Last 24 hours) at 12/14/2018 0900 Last data filed at 12/14/2018 0400 Gross per 24 hour  Intake 304.08 ml  Output -  Net 304.08 ml     Physical Exam  Awake Alert, Oriented X 3, No new F.N deficits, Normal affect [place].AT,PERRAL Supple Neck,No JVD, No cervical lymphadenopathy appriciated.  Symmetrical Chest wall movement, Good air movement bilaterally, CTAB RRR,No Gallops,Rubs or new Murmurs, No Parasternal Heave +ve B.Sounds, Abd Soft, No tenderness, No organomegaly appriciated, No rebound - guarding or rigidity. No Cyanosis, Clubbing or edema, No new Rash or bruise      Data Review:    CBC Recent Labs  Lab 12/13/18 1646  WBC 9.9  HGB 10.8*  HCT 34.6*  PLT 267  MCV 92.0  MCH 28.7  MCHC 31.2  RDW 14.6  LYMPHSABS 3.6  MONOABS 0.9  EOSABS 0.0  BASOSABS 0.0    Chemistries  Recent Labs  Lab 12/13/18 1646  NA 139  K 3.1*  CL 98  CO2 28  GLUCOSE 123*  BUN 12  CREATININE 0.96  CALCIUM 8.2*  AST 14*  ALT 8  ALKPHOS 87  BILITOT 0.4   ------------------------------------------------------------------------------------------------------------------ No results for input(s): CHOL, HDL, LDLCALC, TRIG, CHOLHDL, LDLDIRECT in the last 72 hours.  No results found for: HGBA1C ------------------------------------------------------------------------------------------------------------------ No results for input(s): TSH, T4TOTAL, T3FREE, THYROIDAB in the last 72 hours.  Invalid input(s): FREET3  Cardiac Enzymes No results for input(s): CKMB, TROPONINI, MYOGLOBIN in the last 168 hours.  Invalid input(s): CK  ------------------------------------------------------------------------------------------------------------------    Component Value Date/Time   BNP 463.0 (H) 08/14/2015 1753    Micro Results No results found for this or any previous visit (from the past 240 hour(s)).  Radiology Reports Dg Chest Portable 1 View  Result Date: 12/13/2018 CLINICAL DATA:  Shortness of breath, weakness EXAM: PORTABLE CHEST 1 VIEW COMPARISON:  10/09/2018 FINDINGS: Heart is upper limits normal in size. Mild peribronchial thickening and interstitial prominence is similar to prior study. No effusions or acute bony abnormality. IMPRESSION: Mild bronchitic changes. Electronically Signed   By: Rolm Baptise M.D.   On: 12/13/2018 17:14

## 2018-12-14 NOTE — ED Notes (Signed)
Pt given more warm blankets. Report given to carelink.

## 2018-12-14 NOTE — Plan of Care (Signed)
  Problem: Respiratory: Goal: Ability to maintain a clear airway will improve Outcome: Progressing   Pt educated on flutter valve.  Pt demonstrated via teach back method on how to use the flutter valve.  Pt had no questions in regards to the flutter valve.

## 2018-12-14 NOTE — Progress Notes (Signed)
Called pt's son to update him on his mothers plan of care.  Pt's son didn't answer.  Will call again at 2pm.

## 2018-12-14 NOTE — Care Management (Signed)
Case manager will follow patient for appropriate disposition as she medically improves, may she be blessed to do so.    Ricki Miller, RN BSN Case Manager [phone]

## 2018-12-14 NOTE — H&P (Signed)
History and Physical    Jamie Phillips [id number] DOB: Nov 10, 1940 DOA: 12/14/2018  PCP: Kirk Ruths, MD  Patient coming from: Home  I have personally briefly reviewed patient's old medical records in [place]  Chief Complaint: N/V/D, SOB  HPI: Jamie Phillips is a 78 y.o. 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  Patient's daughter was just discharged from [facility] for Jamie Phillips, patient had been with daughter.  When daughter was found to have COVID, patient got tested (13 days ago on 8/10) and her COVID came back positive.  She was initially asymptomatic, but over the past 1 week has developed chills, N/V/D, generalized weakness, loss of appetitie.  Mild SOB, no cough.  Unable to keep POs down for past 2 days.  Increasing SOB today.  No dysuria, no leg swelling, no rash.   ED Course: Satting ~90% on RA, improved to mid 90s on 2L via [place].  CXR neg.  Given rocephin 1g and IVF in ED.  Transferred to [place].   Review of Systems: As per HPI, otherwise all review of systems negative.  Past Medical History:  Diagnosis Date  . Anginal pain ([place])    atypical chest pain  . Anxiety   . Arthritis   . Back pain   . Cancer ([place])    cll  . Chronic kidney disease   . CLL (chronic lymphocytic leukemia) ([place])   . Depression   . GERD (gastroesophageal reflux disease)   . Headache   . Hemorrhoids   . Hypercholesteremia   . Hyperlipidemia   . Hypertension   . 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 Macular degeneration   . MGUS (monoclonal gammopathy of unknown significance)   . Obesity   . Pulmonary embolism ([place])   . PVD (peripheral vascular disease) ([place])   . Sleep apnea     Past Surgical History:  Procedure Laterality Date  . BREAST BIOPSY Left 08/19/04   lt bx/clip-neg  . COLONOSCOPY    . COLONOSCOPY WITH PROPOFOL N/A 03/10/2017   Procedure: COLONOSCOPY WITH PROPOFOL;  Surgeon:  [name], MD;  Location: [facility] ENDOSCOPY;  Service: Endoscopy;  Laterality: N/A;  . IVC FILTER INSERTION N/A 06/11/2016   Procedure: IVC Filter Insertion;  Surgeon: Katha Cabal, MD;  Location: [place] CV LAB;  Service: Cardiovascular;  Laterality: N/A;  . IVC FILTER REMOVAL N/A 05/06/2017   Procedure: IVC FILTER REMOVAL;  Surgeon: Katha Cabal, MD;  Location: [place] CV LAB;  Service: Cardiovascular;  Laterality: N/A;  . JOINT REPLACEMENT Left 06/17/2016  . LAPAROSCOPIC APPENDECTOMY N/A 10/10/2018   Procedure: APPENDECTOMY LAPAROSCOPIC ATTEMPTED CONVERTED TO OPEN;  Surgeon: Herbert Pun, MD;  Location: [facility] ORS;  Service: General;  Laterality: N/A;  . LAPAROSCOPY N/A 10/10/2018   Procedure: LAPAROSCOPY DIAGNOSTIC ATTEMPTED CONVERTED TO OPEN;  Surgeon: Herbert Pun, MD;  Location: [facility] ORS;  Service: General;  Laterality: N/A;  . neck fusion    . PARTIAL COLECTOMY Right 10/10/2018   Procedure: PARTIAL COLECTOMY;  Surgeon: Herbert Pun, MD;  Location: [facility] ORS;  Service: General;  Laterality: Right;  . PERIPHERAL VASCULAR CATHETERIZATION N/A 08/15/2015   Procedure: pulmonary angiogram with lysis;  Surgeon: Katha Cabal, MD;  Location: [place] CV LAB;  Service: Cardiovascular;  Laterality: N/A;  . WRIST SURGERY Left 2000     reports that she has never smoked. She has never used smokeless tobacco. She reports  that she does not drink alcohol or use drugs.  Allergies  Allergen Reactions  . Oxycodone Other (See Comments)    Reaction:  Hallucinations  . Morphine Other (See Comments)    Reaction:  Hallucinations    Family History  Problem Relation Age of Onset  . Heart failure Father   . Congestive Heart Failure Father   . Pulmonary embolism Mother   . Breast cancer Neg Hx      Prior to Admission medications   Medication Sig Start Date End Date Taking? Authorizing Provider  acetaminophen (TYLENOL) 650 MG CR tablet Take  650-1,300 mg by mouth every 8 (eight) hours as needed for pain. TYLENOL ARTHRITIS    [provider]  albuterol (PROVENTIL HFA;VENTOLIN HFA) 108 (90 Base) MCG/ACT inhaler Inhale 2 puffs into the lungs every 6 (six) hours as needed for wheezing or shortness of breath.    [provider]  amoxicillin (AMOXIL) 500 MG capsule Take 2,000 mg by mouth. Prior to dental appointments 04/23/18   [provider]  ELIQUIS 5 MG TABS tablet TAKE 1 TABLET TWICE DAILY Patient taking differently: Take 5 mg by mouth 2 (two) times daily.  01/03/16   Cammie Sickle, MD  escitalopram (LEXAPRO) 10 MG tablet Take 10 mg by mouth daily.    [provider]  gabapentin (NEURONTIN) 300 MG capsule Take 300 mg by mouth 2 (two) times daily.     [provider]  losartan-hydrochlorothiazide (HYZAAR) 100-12.5 MG tablet Take 1 tablet by mouth daily. Reported on 08/22/2015    [provider]  Multiple Vitamins-Minerals (PRESERVISION AREDS 2 PO) Take 1 capsule by mouth 2 (two) times daily.     [provider]  pantoprazole (PROTONIX) 40 MG tablet Take 1 tablet (40 mg total) by mouth 2 (two) times daily for 14 days. 10/17/18 10/31/18  Herbert Pun, MD  pravastatin (PRAVACHOL) 40 MG tablet Take 40 mg by mouth daily.     [provider]  propranolol (INDERAL) 40 MG tablet Take 40 mg by mouth 2 (two) times a day.    [provider]  sucralfate (CARAFATE) 1 GM/10ML suspension Take 10 mLs (1 g total) by mouth 4 (four) times daily -  with meals and at bedtime. 10/17/18   Herbert Pun, MD  tiZANidine (ZANAFLEX) 4 MG tablet Take 4 mg by mouth at bedtime as needed for muscle spasms.     [provider]    Physical Exam: There were no vitals filed for this visit.  RR 28, T 98.9, HR 64, BP 144/77  Constitutional: NAD, calm, comfortable Eyes: PERRL, lids and conjunctivae normal ENMT: Mucous membranes are moist. Posterior pharynx clear of  any exudate or lesions.Normal dentition.  Neck: normal, supple, no masses, no thyromegaly Respiratory: Mild dyspnea Cardiovascular: Regular rate and rhythm, no murmurs / rubs / gallops. No extremity edema. 2+ pedal pulses. No carotid bruits.  Abdomen: no tenderness, no masses palpated. No hepatosplenomegaly. Bowel sounds positive.  Musculoskeletal: no clubbing / cyanosis. No joint deformity upper and lower extremities. Good ROM, no contractures. Normal muscle tone.  Skin: no rashes, lesions, ulcers. No induration Neurologic: CN 2-12 grossly intact. Sensation intact, DTR normal. Strength 5/5 in all 4.  Psychiatric: Normal judgment and insight. Alert and oriented x 3. Normal mood.    Labs on Admission: I have personally reviewed following labs and imaging studies  CBC: Recent Labs  Lab 12/13/18 1646  WBC 9.9  NEUTROABS 5.3  HGB 10.8*  HCT 34.6*  MCV  92.0  PLT 99991111   Basic Metabolic Panel: Recent Labs  Lab 12/13/18 1646  NA 139  K 3.1*  CL 98  CO2 28  GLUCOSE 123*  BUN 12  CREATININE 0.96  CALCIUM 8.2*   GFR: Estimated Creatinine Clearance: 64.1 mL/min (by C-G formula based on SCr of 0.96 mg/dL). Liver Function Tests: Recent Labs  Lab 12/13/18 1646  AST 14*  ALT 8  ALKPHOS 87  BILITOT 0.4  PROT 6.3*  ALBUMIN 2.8*   No results for input(s): LIPASE, AMYLASE in the last 168 hours. No results for input(s): AMMONIA in the last 168 hours. Coagulation Profile: No results for input(s): INR, PROTIME in the last 168 hours. Cardiac Enzymes: No results for input(s): CKTOTAL, CKMB, CKMBINDEX, TROPONINI in the last 168 hours. BNP (last 3 results) No results for input(s): PROBNP in the last 8760 hours. HbA1C: No results for input(s): HGBA1C in the last 72 hours. CBG: No results for input(s): GLUCAP in the last 168 hours. Lipid Profile: No results for input(s): CHOL, HDL, LDLCALC, TRIG, CHOLHDL, LDLDIRECT in the last 72 hours. Thyroid Function Tests: No results for  input(s): TSH, T4TOTAL, FREET4, T3FREE, THYROIDAB in the last 72 hours. Anemia Panel: No results for input(s): VITAMINB12, FOLATE, FERRITIN, TIBC, IRON, RETICCTPCT in the last 72 hours. Urine analysis:    Component Value Date/Time   COLORURINE YELLOW (A) 12/13/2018 1648   APPEARANCEUR CLOUDY (A) 12/13/2018 1648   APPEARANCEUR Hazy 04/29/2012 1921   LABSPEC 1.025 12/13/2018 1648   LABSPEC 1.016 04/29/2012 1921   PHURINE 5.0 12/13/2018 1648   GLUCOSEU NEGATIVE 12/13/2018 1648   GLUCOSEU Negative 04/29/2012 1921   HGBUR NEGATIVE 12/13/2018 1648   BILIRUBINUR NEGATIVE 12/13/2018 1648   BILIRUBINUR Negative 04/29/2012 1921   KETONESUR NEGATIVE 12/13/2018 1648   PROTEINUR 30 (A) 12/13/2018 1648   NITRITE NEGATIVE 12/13/2018 1648   LEUKOCYTESUR NEGATIVE 12/13/2018 1648   LEUKOCYTESUR Trace 04/29/2012 1921    Radiological Exams on Admission: Dg Chest Portable 1 View  Result Date: 12/13/2018 CLINICAL DATA:  Shortness of breath, weakness EXAM: PORTABLE CHEST 1 VIEW COMPARISON:  10/09/2018 FINDINGS: Heart is upper limits normal in size. Mild peribronchial thickening and interstitial prominence is similar to prior study. No effusions or acute bony abnormality. IMPRESSION: Mild bronchitic changes. Electronically Signed   By: Rolm Baptise M.D.   On: 12/13/2018 17:14    EKG: Independently reviewed.  Assessment/Plan Principal Problem:   COVID-19 Active Problems:   Essential hypertension   Chronic pulmonary embolism ([place])    1. COVID-19 - 1. COVID pathway 2. Dyspnea but no formal hypoxia at this point, also no CXR findings 3. Decadron 6mg  daily 4. Not yet a candidate for remdesivir nor actemra 5. IVF: 125 cc/hr NS 6. Daily CBC, CMP, D.Dimer, and CRP 7. Checking CRP, D.Dimer, troponin now 2. Chronic saddle PE - 1. Continue Eliquis 3. HTN - 1. Continue propranolol 2. Holding HCTZ-Losartan  DVT prophylaxis: Eliquis Code Status: DNR - confirmed with patient Family Communication: No  family in room Disposition Plan: Home after admit Consults called: None Admission status: Place in obs - suspect she will end up IP though   [place], [place] Hospitalists  How to contact the [facility] Attending or Consulting provider [place] or covering provider during after hours [place], for this patient?  1. Check the care team in [facility] and look for a) attending/consulting TRH provider listed and b) the [facility] team listed 2. Log into [email or web address]  Amion Physician Scheduling and  messaging for groups and whole hospitals  On call and physician scheduling software for group practices, residents, hospitalists and other medical providers for call, clinic, rotation and shift schedules. OnCall Enterprise is a hospital-wide system for scheduling doctors and paging doctors on call. EasyPlot is for scientific plotting and data analysis.  [email or web address]  and use [place]'s universal password to access. If you do not have the password, please contact the hospital operator.  3. Locate the [facility] provider you are looking for under [facility] and page to a number that you can be directly reached. 4. If you still have difficulty reaching the provider, please page the [facility] (Director on Call) for the Hospitalists listed on amion for assistance.  12/14/2018, 2:21 AM

## 2018-12-15 ENCOUNTER — Encounter (HOSPITAL_COMMUNITY): Payer: Self-pay

## 2018-12-15 LAB — CBC WITH DIFFERENTIAL/PLATELET
Abs Immature Granulocytes: 0.04 10*3/uL (ref 0.00–0.07)
Basophils Absolute: 0 10*3/uL (ref 0.0–0.1)
Basophils Relative: 0 %
Eosinophils Absolute: 0 10*3/uL (ref 0.0–0.5)
Eosinophils Relative: 0 %
HCT: 32.5 % — ABNORMAL LOW (ref 36.0–46.0)
Hemoglobin: 10 g/dL — ABNORMAL LOW (ref 12.0–15.0)
Immature Granulocytes: 0 %
Lymphocytes Relative: 28 %
Lymphs Abs: 3 10*3/uL (ref 0.7–4.0)
MCH: 29.3 pg (ref 26.0–34.0)
MCHC: 30.8 g/dL (ref 30.0–36.0)
MCV: 95.3 fL (ref 80.0–100.0)
Monocytes Absolute: 0.9 10*3/uL (ref 0.1–1.0)
Monocytes Relative: 8 %
Neutro Abs: 7 10*3/uL (ref 1.7–7.7)
Neutrophils Relative %: 64 %
Platelets: 259 10*3/uL (ref 150–400)
RBC: 3.41 MIL/uL — ABNORMAL LOW (ref 3.87–5.11)
RDW: 14.6 % (ref 11.5–15.5)
WBC: 10.9 10*3/uL — ABNORMAL HIGH (ref 4.0–10.5)
nRBC: 0 % (ref 0.0–0.2)

## 2018-12-15 LAB — COMPREHENSIVE METABOLIC PANEL
ALT: 8 U/L (ref 0–44)
AST: 14 U/L — ABNORMAL LOW (ref 15–41)
Albumin: 2.8 g/dL — ABNORMAL LOW (ref 3.5–5.0)
Alkaline Phosphatase: 79 U/L (ref 38–126)
Anion gap: 10 (ref 5–15)
BUN: 19 mg/dL (ref 8–23)
CO2: 26 mmol/L (ref 22–32)
Calcium: 8.3 mg/dL — ABNORMAL LOW (ref 8.9–10.3)
Chloride: 104 mmol/L (ref 98–111)
Creatinine, Ser: 0.84 mg/dL (ref 0.44–1.00)
GFR calc Af Amer: >60 mL/min (ref >60)
GFR calc non Af Amer: >60 mL/min (ref >60)
Glucose, Bld: 125 mg/dL — ABNORMAL HIGH (ref 70–99)
Potassium: 4.3 mmol/L (ref 3.5–5.1)
Sodium: 140 mmol/L (ref 135–145)
Total Bilirubin: 0.2 mg/dL — ABNORMAL LOW (ref 0.3–1.2)
Total Protein: 6.1 g/dL — ABNORMAL LOW (ref 6.5–8.1)

## 2018-12-15 LAB — C-REACTIVE PROTEIN: CRP: 15.4 mg/dL — ABNORMAL HIGH (ref <1.0)

## 2018-12-15 LAB — D-DIMER, QUANTITATIVE: D-Dimer, Quant: 1.06 ug/mL-FEU — ABNORMAL HIGH (ref 0.00–0.50)

## 2018-12-15 LAB — LACTATE DEHYDROGENASE: LDH: 268 U/L — ABNORMAL HIGH (ref 98–192)

## 2018-12-15 LAB — FERRITIN: Ferritin: 79 ng/mL (ref 11–307)

## 2018-12-15 LAB — MAGNESIUM: Magnesium: 1.8 mg/dL (ref 1.7–2.4)

## 2018-12-15 LAB — BRAIN NATRIURETIC PEPTIDE: B Natriuretic Peptide: 131.4 pg/mL — ABNORMAL HIGH (ref 0.0–100.0)

## 2018-12-15 NOTE — Progress Notes (Signed)
Occupational Therapy Evaluation Patient Details Name: Jamie Phillips MRN: [id number] DOB: [birth year] Today's Date: 12/15/2018    History of Present Illness 78 y.o. 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 Patient's daughter was just discharged from [facility] for [place]. Pt tested +COVID 11/30/18. +dehydration and hypokalemia   Clinical Impression   PTA, pt was independent with ADL and mobility. Pt uses a rollator for community mobility. Pt able to ambulate to bathroom and complete toileting and grooming task @ sink level on RA with SpO2 @91 -93. Pt has SOB at baseline but states that she is more fatigued than her usual. LB ADL are difficult due to her body habitus. Will follow acutely to educate on energy conservation and use of AE for ADL. Educated pt on use of incentive spirometer - able to pull @ 750 ml. Pt appreciative.     Follow Up Recommendations  No OT follow up;Supervision - Intermittent    Equipment Recommendations  None recommended by OT    Recommendations for Other Services       Precautions / Restrictions Precautions Precautions: Fall Precaution Comments: denies falls; uses walker for safety due to blood thinners       Mobility Bed Mobility                  Transfers Overall transfer level: Needs assistance Equipment used: Rolling walker (2 wheeled) Transfers: Sit to/from Stand Sit to Stand: Min guard         General transfer comment: vc for safe hand placement with RW    Balance Overall balance assessment: No apparent balance deficits (not formally assessed)                                         ADL either performed or assessed with clinical judgement   ADL Overall ADL's : Needs assistance/impaired     Grooming: Set up;Standing   Upper Body Bathing: Set up;Standing   Lower Body Bathing: Minimal assistance;Sit to/from stand    Upper Body Dressing : Set up;Sitting   Lower Body Dressing: Minimal assistance;Sit to/from stand   Toilet Transfer: Min guard;RW;Ambulation;Grab Environmental education officer and Hygiene: Supervision/safety       Functional mobility during ADLs: Min guard;Rolling walker General ADL Comments: Has difficulty reaching her feet due to body habitus. Has AE at home. Would benefit form review and energy conservation strategies     Vision Baseline Vision/History: Wears glasses       Perception     Praxis      Pertinent Vitals/Pain       Hand Dominance Right   Extremity/Trunk Assessment Upper Extremity Assessment Upper Extremity Assessment: Generalized weakness   Lower Extremity Assessment Lower Extremity Assessment: Generalized weakness   Cervical / Trunk Assessment Cervical / Trunk Assessment: Other exceptions Cervical / Trunk Exceptions: obese   Communication Communication Communication: HOH   Cognition Arousal/Alertness: Awake/alert Behavior During Therapy: WFL for tasks assessed/performed Overall Cognitive Status: Within Functional Limits for tasks assessed                                     General Comments   Pt issued word search puzzle; enjoys spending time with her grand chilldren. Her grand  daughter is a PT/PTA?     Exercises Exercises: Other exercises Other Exercises Other Exercises: educated in use of IS x 10 breaths  Other Exercises: Issued level 2 theraband. will need written HEP   Shoulder Instructions      Home Living Family/patient expects to be discharged to:: Private residence Living Arrangements: Children(40 something old son moved in after divorce; ) Available Help at Discharge: Family;Available PRN/intermittently(works 3 rd shift) Type of Home: House Home Access: Stairs to enter [employer] of Steps: 3 Entrance Stairs-Rails: Right;Left Home Layout: One level     Bathroom Shower/Tub:  Tub/shower unit;Curtain   Bathroom Toilet: Standard(sink beside) Bathroom Accessibility: No   Home Equipment: Bedside commode;Grab bars - tub/shower;Shower seat;Walker - 2 wheels;Walker - 4 wheels          Prior Functioning/Environment Level of Independence: Independent with assistive device(s)        Comments: uses rollator; drives; does all but yard work because son won't let her        OT Problem List: Decreased activity tolerance;Decreased knowledge of use of DME or AE;Cardiopulmonary status limiting activity      OT Treatment/Interventions: Self-care/ADL training;Therapeutic exercise;Energy conservation;DME and/or AE instruction;Therapeutic activities;Patient/family education    OT Goals(Current goals can be found in the care plan section) Acute Rehab OT Goals Patient Stated Goal: regain her strength to be able to keep up with grandchild OT Goal Formulation: With patient Time For Goal Achievement: 12/29/18 Potential to Achieve Goals: Good  OT Frequency: Min 3X/week   Barriers to D/C:            Co-evaluation              AM-PAC OT "6 Clicks" Daily Activity     Outcome Measure Help from another person eating meals?: None Help from another person taking care of personal grooming?: A Little Help from another person toileting, which includes using toliet, bedpan, or urinal?: A Little Help from another person bathing (including washing, rinsing, drying)?: A Little Help from another person to put on and taking off regular upper body clothing?: A Little Help from another person to put on and taking off regular lower body clothing?: A Little 6 Click Score: 19   End of Session Nurse Communication: Mobility status;Other (comment)(monitor O2 sats)  Activity Tolerance: Patient tolerated treatment well Patient left: in chair;with call bell/phone within reach  OT Visit Diagnosis: Unsteadiness on feet (R26.81);Muscle weakness (generalized) (M62.81)                 Time: [id number] OT Time Calculation (min): 32 min Charges:  OT General Charges $OT Visit: 1 Visit OT Evaluation $OT Eval Low Complexity: 1 Low OT Treatments $Self Care/Home Management : 8-22 mins  Maurie Boettcher, OT/L   Acute OT Clinical Specialist Acute Rehabilitation Services Pager [phone] Office [phone]   [facility] 12/15/2018, 5:47 PM

## 2018-12-15 NOTE — Progress Notes (Signed)
Called pt's son at 63 with an update on his moms plan of care.  Pt's son stated he had no questions in regards to the information.

## 2018-12-15 NOTE — Plan of Care (Signed)
  Problem: Respiratory: Goal: Will maintain a patent airway Outcome: Progressing Goal: Complications related to the disease process, condition or treatment will be avoided or minimized Outcome: Progressing  Pt using flutter valve and IS every 2 hours during shift.  Pt ambulating 3 times per shift.  Pt's o2 level this morning is 94%.

## 2018-12-15 NOTE — Plan of Care (Signed)
Pt. Cough and deep breathe with teach back

## 2018-12-15 NOTE — Evaluation (Signed)
Physical Therapy Evaluation Patient Details Name: Jamie Phillips MRN: [id number] DOB: [birth year] Today's Date: 12/15/2018   History of Present Illness  78 y.o. 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 Patient's daughter was just discharged from [facility] for [place]. Pt tested +COVID 11/30/18. +dehydration and hypokalemia  Clinical Impression   Pt admitted with above diagnosis. She has been struggling with severe fatigue while at home with subsequent generalized weakness. She is very independent minded and can benefit from energy conservation techniques (OT ordered) in addition to strengthening. Pt currently with functional limitations due to the deficits listed below (see PT Problem List). Pt will benefit from skilled PT to increase their independence and safety with mobility to allow discharge to the venue listed below.       Follow Up Recommendations Home health PT;Supervision for mobility/OOB    Equipment Recommendations  None recommended by PT    Recommendations for Other Services OT consult     Precautions / Restrictions Precautions Precautions: Fall Precaution Comments: denies falls; uses walker for safety due to blood thinners       Mobility  Bed Mobility                  Transfers Overall transfer level: Needs assistance Equipment used: Rolling walker (2 wheeled) Transfers: Sit to/from Stand Sit to Stand: Min guard         General transfer comment: vc for safe hand placement with RW  Ambulation/Gait Ambulation/Gait assistance: Supervision Gait Distance (Feet): 100 Feet Assistive device: Rolling walker (2 wheeled) Gait Pattern/deviations: Step-through pattern;Decreased stride length Gait velocity: decr   General Gait Details: slow but steady; vc for proximity to [employer]    Modified Rankin (Stroke Patients Only)       Balance Overall balance assessment: No apparent balance deficits (not formally assessed)                                           Pertinent Vitals/Pain Pain Assessment: No/denies pain    Home Living Family/patient expects to be discharged to:: Private residence Living Arrangements: Children(40 something old son moved in after divorce; ) Available Help at Discharge: Family;Available PRN/intermittently(works 3 rd shift) Type of Home: House Home Access: Stairs to enter Entrance Stairs-Rails: Psychiatric nurse of Steps: 3 Home Layout: One level Home Equipment: Bedside commode;Grab bars - tub/shower;Shower seat;Walker - 2 wheels;Walker - 4 wheels      Prior Function Level of Independence: Independent with assistive device(s)         Comments: uses rollator; drives; does all but yard work because son won't let her     Hand Dominance   Dominant Hand: Right    Extremity/Trunk Assessment   Upper Extremity Assessment Upper Extremity Assessment: Generalized weakness    Lower Extremity Assessment Lower Extremity Assessment: Generalized weakness    Cervical / Trunk Assessment Cervical / Trunk Assessment: Other exceptions Cervical / Trunk Exceptions: obese  Communication   Communication: HOH  Cognition Arousal/Alertness: Awake/alert Behavior During Therapy: WFL for tasks assessed/performed Overall Cognitive Status: Within Functional Limits for tasks assessed  General Comments      Exercises Other Exercises Other Exercises: educated in use of IS x 5 breaths and flutter valve x 5 breaths  Other Exercises: Educated in AROM all 4 extremities with plans to instruct in formal HEP next visit   Assessment/Plan    PT Assessment Patient needs continued PT services  PT Problem List Decreased strength;Decreased activity tolerance;Decreased mobility;Decreased knowledge of use of  DME;Cardiopulmonary status limiting activity;Obesity       PT Treatment Interventions DME instruction;Gait training;Functional mobility training;Therapeutic activities;Therapeutic exercise;Patient/family education    PT Goals (Current goals can be found in the Care Plan section)  Acute Rehab PT Goals Patient Stated Goal: regain her strength to be able to keep up with grandchild PT Goal Formulation: With patient Time For Goal Achievement: 12/29/18 Potential to Achieve Goals: Good    Frequency Min 3X/week   Barriers to discharge        Co-evaluation               AM-PAC PT "6 Clicks" Mobility  Outcome Measure Help needed turning from your back to your side while in a flat bed without using bedrails?: A Little Help needed moving from lying on your back to sitting on the side of a flat bed without using bedrails?: A Little Help needed moving to and from a bed to a chair (including a wheelchair)?: A Little Help needed standing up from a chair using your arms (e.g., wheelchair or bedside chair)?: A Little Help needed to walk in hospital room?: A Little Help needed climbing 3-5 steps with a railing? : A Little 6 Click Score: 18    End of Session Equipment Utilized During Treatment: Oxygen Activity Tolerance: Patient limited by fatigue Patient left: in chair;with call bell/phone within reach   PT Visit Diagnosis: Muscle weakness (generalized) (M62.81);Difficulty in walking, not elsewhere classified (R26.2)    Time: [id number] PT Time Calculation (min) (ACUTE ONLY): 41 min   Charges:   PT Evaluation $PT Eval Low Complexity: 1 Low PT Treatments $Gait Training: 8-22 mins $Self Care/Home Management: 8-22          Barry Brunner, PT      Rexanne Mano 12/15/2018, 10:17 AM

## 2018-12-15 NOTE — TOC Initial Note (Signed)
Transition of Care [facility]) - Initial/Assessment Note    Patient Details  Name: Jamie Phillips MRN: [id number] Date of Birth: [birth year]  Transition of Care [facility]) CM/SW Contact:    Ninfa Meeker, RN Phone Number:[phone] (working remotely) 12/15/2018, 12:06 PM  Clinical Narrative:   78 yr old female admitted with COVID 19. Patient's daughter was recently discharged from [facility]. Patient developed sypmtoms shortly there after.patient via telephone concerning discharge plan. Choice for Home Health Agency was offered. Referral was called to Adela Lank, [facility] Liaison.  Patient says she has  RW and 3in1 from Knee replacement surgery.  She will have support at discharge.            Expected Discharge Plan: [place] Barriers to Discharge: No Barriers Identified   Patient Goals and CMS Choice Patient states their goals for this hospitalization and ongoing recovery are:: continue to get better CMS Medicare.gov Compare Post Acute Care list provided to:: Patient(verbally presented)    Expected Discharge Plan and Services Expected Discharge Plan: [place] Choice: [place] arrangements for the past 2 months: Single Family Home Expected Discharge Date: 12/21/18               DME Arranged: N/A         HH Arranged: PT, OT   Date HH Agency Contacted: 12/15/18 Time HH Agency Contacted: 1201 Representative spoke with at [place]: Adela Lank  Prior Living Arrangements/Services Living arrangements for the past 2 months: [place] with:: Adult Children Patient language and need for interpreter reviewed:: Yes Do you feel safe going back to the place where you live?: Yes      Need for Family Participation in Patient Care: Yes (Comment) Care giver support system in place?: Yes (comment)   Criminal Activity/Legal Involvement Pertinent to Current Situation/Hospitalization: No - Comment as  needed  Activities of Daily Living Home Assistive Devices/Equipment: Eyeglasses ADL Screening (condition at time of admission) Patient's cognitive ability adequate to safely complete daily activities?: Yes Is the patient deaf or have difficulty hearing?: No Does the patient have difficulty seeing, even when wearing glasses/contacts?: No Does the patient have difficulty concentrating, remembering, or making decisions?: No Patient able to express need for assistance with ADLs?: Yes Does the patient have difficulty dressing or bathing?: Yes Independently performs ADLs?: No Communication: Independent Dressing (OT): Needs assistance Is this a change from baseline?: Change from baseline, expected to last <3days Grooming: Needs assistance Is this a change from baseline?: Change from baseline, expected to last <3 days Feeding: Independent Bathing: Needs assistance Is this a change from baseline?: Change from baseline, expected to last <3 days Toileting: Needs assistance Is this a change from baseline?: Change from baseline, expected to last <3 days In/Out Bed: Needs assistance Is this a change from baseline?: Change from baseline, expected to last <3 days Walks in Home: Independent Does the patient have difficulty walking or climbing stairs?: Yes Weakness of Legs: Both Weakness of Arms/Hands: None  Permission Sought/Granted Permission sought to share information with : Case Manager Permission granted to share information with : Yes, Verbal Permission Granted  Share Information with NAME: Adela Lank  Permission granted to share info w AGENCY: [facility]        Emotional Assessment   Attitude/Demeanor/Rapport: Gracious, Engaged   Orientation: : Oriented to Self, Oriented to Situation, Oriented to Place, Oriented to  Time Alcohol /  Substance Use: Not Applicable    Admission diagnosis:  COVID-19 [U07.1, J98.8] Patient Active Problem List   Diagnosis Date Noted  .  COVID-19 12/14/2018  . Right lower quadrant pain 10/09/2018  . Calculus of gallbladder without cholecystitis without obstruction 08/27/2016  . 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 History of DVT in adulthood 03/04/2016  . DJD (degenerative joint disease) 03/04/2016  . Pain in limb 03/04/2016  . Venous insufficiency 03/04/2016  . 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 Acute respiratory failure ([place]) 08/14/2015  . Acute bronchitis 05/11/2015  . Essential hypertension 05/04/2015  . Hyperlipidemia 05/04/2015  . Pain in the chest 05/04/2015  . SOB (shortness of breath) 05/04/2015   PCP:  Kirk Ruths, MD Pharmacy:   [place], [place] - [address] [place] [address] Phone: [phone] Fax: [phone]  [facility] - [place], [place] [address] Phone: [phone] Fax: [phone]     Social Determinants of Health (SDOH) Interventions    Readmission Risk Interventions No flowsheet data found.

## 2018-12-15 NOTE — Progress Notes (Signed)
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 Her daughter was recently admitted and discharged from [facility] for Covid. Her GI symptoms have been present for the past week prior to admission.   Subjective: -feels weak, denies any nausea/vomiting but still persistent diarrhea.  Also complains of a sore throat today which is new  Assessment & Plan: Principal Problem:   COVID-19 Active Problems:   Essential hypertension   Chronic pulmonary embolism (HCC)   Principal Problem Acute Covid-19 Viral Illness -Patient presented to the hospital with coughing 19 viral infection with predominant gastroenteritis, dehydration, arm weakness.  She is not short of breath, denies any cough or chest congestion.  PT/OT pending. -CRP seems to be improving -Continue supportive treatment, without lung involvement does not meet criteria for Remdesivir     COVID-19 Labs  Recent Labs    12/14/18 0750 12/15/18 0145  DDIMER  --  1.06*  FERRITIN  --  79  LDH  --  268*  CRP 22.3* 15.4*    Active Problems History of saddle PE -Continue Eliquis  Dehydration hypokalemia -Potassium repleted, repeat tomorrow morning.  Receiving IV fluids.  Hypertension -Continue propranolol  UTI -Continue ceftriaxone for 3 days  Scheduled Meds: . albuterol  2 puff Inhalation Q6H  . apixaban  5 mg Oral BID  . dexamethasone  6 mg Oral Daily  . escitalopram  10 mg Oral Daily  . gabapentin  300 mg Oral BID  . nystatin   Topical TID  . pantoprazole  40 mg Oral Daily  . pravastatin  40 mg Oral Daily  . propranolol  40 mg Oral BID  . sucralfate  1 g Oral TID WC & HS   Continuous Infusions: . cefTRIAXone  (ROCEPHIN)  IV 2 g (12/15/18 0826)   PRN Meds:.acetaminophen, chlorpheniramine-HYDROcodone, guaiFENesin-dextromethorphan, hydrALAZINE, [DISCONTINUED] ondansetron **OR** ondansetron (ZOFRAN) IV, tiZANidine  DVT prophylaxis: Eliquis Code Status: DNR Family Communication: d/w patient  Disposition Plan: home when ready   Consultants:   None   Procedures:   None   Antimicrobials:  None    Objective: Vitals:   12/14/18 0340 12/14/18 0753 12/14/18 1557 12/14/18 2005  BP:  121/66 (!) 134/99 (!) 145/71  Pulse:  67 63 64  Resp:  (!) 22 20   Temp:  97.9 F (36.6 C) (!) 97.1 F (36.2 C) 97.9 F (36.6 C)  TempSrc:  Oral Oral Oral  SpO2:  95% 95% 95%  Weight: 116.4 kg     Height: 5\' 5"  (1.651 m)       Intake/Output Summary (Last 24 hours) at 12/15/2018 1041 Last data filed at 12/14/2018 2055 Gross per 24 hour  Intake 615.24 ml  Output 300 ml  Net 315.24 ml   Filed Weights   12/14/18 0340  Weight: 116.4 kg    Examination:  Constitutional: NAD Eyes: PERRL, lids and conjunctivae normal, no scleral icterus ENMT: Mucous membranes are dry Respiratory: clear to auscultation bilaterally, no wheezing, no crackles. Normal respiratory effort. Cardiovascular: Regular rate and rhythm, no murmurs / rubs / gallops. No LE edema. 2+ pedal pulses. No carotid bruits.  Abdomen: no tenderness. Bowel sounds positive.  Musculoskeletal: no clubbing / cyanosis.  Neurologic: CN 2-12 grossly intact.  Strength 5/5 in all 4.  Psychiatric: Normal judgment and insight. Alert and oriented x 3. Normal mood.    Data Reviewed: I have independently reviewed following labs and imaging studies   CBC: Recent Labs  Lab 12/13/18 1646 12/15/18 0145  WBC 9.9 10.9*  NEUTROABS 5.3 7.0  HGB 10.8* 10.0*  HCT 34.6* 32.5*  MCV 92.0 95.3  PLT 267 [id number]   Basic Metabolic Panel: Recent Labs  Lab 12/13/18 1646 12/14/18 0750 12/15/18 0145  NA 139  --  140  K 3.1*  --  4.3  CL 98  --  104  CO2 28  --  26   GLUCOSE 123*  --  125*  BUN 12  --  19  CREATININE 0.96  --  0.84  CALCIUM 8.2*  --  8.3*  MG  --  1.8 1.8   GFR: Estimated Creatinine Clearance: 72.7 mL/min (by C-G formula based on SCr of 0.84 mg/dL). Liver Function Tests: Recent Labs  Lab 12/13/18 1646 12/15/18 0145  AST 14* 14*  ALT 8 8  ALKPHOS 87 79  BILITOT 0.4 0.2*  PROT 6.3* 6.1*  ALBUMIN 2.8* 2.8*   No results for input(s): LIPASE, AMYLASE in the last 168 hours. No results for input(s): AMMONIA in the last 168 hours. Coagulation Profile: No results for input(s): INR, PROTIME in the last 168 hours. Cardiac Enzymes: No results for input(s): CKTOTAL, CKMB, CKMBINDEX, TROPONINI in the last 168 hours. BNP (last 3 results) No results for input(s): PROBNP in the last 8760 hours. HbA1C: No results for input(s): HGBA1C in the last 72 hours. CBG: No results for input(s): GLUCAP in the last 168 hours. Lipid Profile: No results for input(s): CHOL, HDL, LDLCALC, TRIG, CHOLHDL, LDLDIRECT in the last 72 hours. Thyroid Function Tests: No results for input(s): TSH, T4TOTAL, FREET4, T3FREE, THYROIDAB in the last 72 hours. Anemia Panel: Recent Labs    12/15/18 0145  FERRITIN 79   Urine analysis:    Component Value Date/Time   COLORURINE YELLOW (A) 12/13/2018 1648   APPEARANCEUR CLOUDY (A) 12/13/2018 1648   APPEARANCEUR Hazy 04/29/2012 1921   LABSPEC 1.025 12/13/2018 1648   LABSPEC 1.016 04/29/2012 1921   PHURINE 5.0 12/13/2018 1648   GLUCOSEU NEGATIVE 12/13/2018 1648   GLUCOSEU Negative 04/29/2012 1921   HGBUR NEGATIVE 12/13/2018 1648   BILIRUBINUR NEGATIVE 12/13/2018 1648   BILIRUBINUR Negative 04/29/2012 1921   KETONESUR NEGATIVE 12/13/2018 1648   PROTEINUR 30 (A) 12/13/2018 1648   NITRITE NEGATIVE 12/13/2018 1648   LEUKOCYTESUR NEGATIVE 12/13/2018 1648   LEUKOCYTESUR Trace 04/29/2012 1921   Sepsis Labs: Invalid input(s): PROCALCITONIN, LACTICIDVEN  Recent Results (from the past 240 hour(s))  Urine culture      Status: Abnormal (Preliminary result)   Collection Time: 12/13/18  4:46 PM   Specimen: Urine, Random  Result Value Ref Range Status   Specimen Description   Final    URINE, RANDOM Performed at [facility], [address], [place], [place] [address]    Special Requests   Final    NONE Performed at [facility], [address] [place], [place] [address]    Culture (A)  Final    >=100,000 COLONIES/mL GRAM NEGATIVE RODS CULTURE REINCUBATED FOR BETTER GROWTH Performed at [facility], [place] [address] [place], [place] [address]    Report Status PENDING  Incomplete      Radiology Studies: Dg Chest Portable 1 View  Result Date: 12/13/2018 CLINICAL DATA:  Shortness of breath, weakness EXAM:  PORTABLE CHEST 1 VIEW COMPARISON:  10/09/2018 FINDINGS: Heart is upper limits normal in size. Mild peribronchial thickening and interstitial prominence is similar to prior study. No effusions or acute bony abnormality. IMPRESSION: Mild bronchitic changes. Electronically Signed   By: Rolm Baptise M.D.   On: 12/13/2018 17:14    Marzetta Board, MD, PhD [facility]  Contact via  [email or web address]  [place] P: [phone] F: [phone]

## 2018-12-16 ENCOUNTER — Inpatient Hospital Stay (HOSPITAL_COMMUNITY): Payer: Medicare HMO

## 2018-12-16 LAB — COMPREHENSIVE METABOLIC PANEL
ALT: 10 U/L (ref 0–44)
AST: 13 U/L — ABNORMAL LOW (ref 15–41)
Albumin: 2.7 g/dL — ABNORMAL LOW (ref 3.5–5.0)
Alkaline Phosphatase: 75 U/L (ref 38–126)
Anion gap: 9 (ref 5–15)
BUN: 21 mg/dL (ref 8–23)
CO2: 27 mmol/L (ref 22–32)
Calcium: 8.4 mg/dL — ABNORMAL LOW (ref 8.9–10.3)
Chloride: 106 mmol/L (ref 98–111)
Creatinine, Ser: 0.95 mg/dL (ref 0.44–1.00)
GFR calc Af Amer: >60 mL/min (ref >60)
GFR calc non Af Amer: 58 mL/min — ABNORMAL LOW (ref >60)
Glucose, Bld: 114 mg/dL — ABNORMAL HIGH (ref 70–99)
Potassium: 4.2 mmol/L (ref 3.5–5.1)
Sodium: 142 mmol/L (ref 135–145)
Total Bilirubin: 0.3 mg/dL (ref 0.3–1.2)
Total Protein: 6.3 g/dL — ABNORMAL LOW (ref 6.5–8.1)

## 2018-12-16 LAB — CBC WITH DIFFERENTIAL/PLATELET
Abs Immature Granulocytes: 0.06 10*3/uL (ref 0.00–0.07)
Basophils Absolute: 0 10*3/uL (ref 0.0–0.1)
Basophils Relative: 0 %
Eosinophils Absolute: 0 10*3/uL (ref 0.0–0.5)
Eosinophils Relative: 0 %
HCT: 32.3 % — ABNORMAL LOW (ref 36.0–46.0)
Hemoglobin: 9.8 g/dL — ABNORMAL LOW (ref 12.0–15.0)
Immature Granulocytes: 1 %
Lymphocytes Relative: 28 %
Lymphs Abs: 2.7 10*3/uL (ref 0.7–4.0)
MCH: 28.7 pg (ref 26.0–34.0)
MCHC: 30.3 g/dL (ref 30.0–36.0)
MCV: 94.4 fL (ref 80.0–100.0)
Monocytes Absolute: 0.8 10*3/uL (ref 0.1–1.0)
Monocytes Relative: 8 %
Neutro Abs: 6.1 10*3/uL (ref 1.7–7.7)
Neutrophils Relative %: 63 %
Platelets: 279 10*3/uL (ref 150–400)
RBC: 3.42 MIL/uL — ABNORMAL LOW (ref 3.87–5.11)
RDW: 14.6 % (ref 11.5–15.5)
WBC: 9.7 10*3/uL (ref 4.0–10.5)
nRBC: 0 % (ref 0.0–0.2)

## 2018-12-16 LAB — C-REACTIVE PROTEIN: CRP: 13.4 mg/dL — ABNORMAL HIGH (ref <1.0)

## 2018-12-16 LAB — URINE CULTURE: Culture: >=100000 — AB

## 2018-12-16 LAB — MAGNESIUM: Magnesium: 1.9 mg/dL (ref 1.7–2.4)

## 2018-12-16 LAB — FERRITIN: Ferritin: 85 ng/mL (ref 11–307)

## 2018-12-16 LAB — D-DIMER, QUANTITATIVE: D-Dimer, Quant: 0.9 ug/mL-FEU — ABNORMAL HIGH (ref 0.00–0.50)

## 2018-12-16 LAB — BRAIN NATRIURETIC PEPTIDE: B Natriuretic Peptide: 332.6 pg/mL — ABNORMAL HIGH (ref 0.0–100.0)

## 2018-12-16 LAB — LACTATE DEHYDROGENASE: LDH: 227 U/L — ABNORMAL HIGH (ref 98–192)

## 2018-12-16 MED ORDER — SODIUM CHLORIDE 0.9 % IV SOLN
100.0000 mg | INTRAVENOUS | Status: AC
  Administered 2018-12-17 – 2018-12-20 (×4): 100 mg via INTRAVENOUS
  Filled 2018-12-16 (×4): qty 20

## 2018-12-16 MED ORDER — METHYLPREDNISOLONE SODIUM SUCC 40 MG IJ SOLR
40.0000 mg | Freq: Three times a day (TID) | INTRAMUSCULAR | Status: DC
  Administered 2018-12-16 – 2018-12-19 (×10): 40 mg via INTRAVENOUS
  Filled 2018-12-16 (×10): qty 1

## 2018-12-16 MED ORDER — SODIUM CHLORIDE 0.9 % IV SOLN
200.0000 mg | Freq: Once | INTRAVENOUS | Status: AC
  Administered 2018-12-16: 200 mg via INTRAVENOUS
  Filled 2018-12-16: qty 40

## 2018-12-16 MED ORDER — ACETAMINOPHEN 325 MG PO TABS
650.0000 mg | ORAL_TABLET | Freq: Four times a day (QID) | ORAL | Status: DC | PRN
  Administered 2018-12-16 – 2018-12-18 (×3): 650 mg via ORAL
  Filled 2018-12-16 (×3): qty 2

## 2018-12-16 NOTE — Progress Notes (Signed)
PROGRESS NOTE  Jamie Phillips [id number] DOB: 01-Feb-1941 DOA: 12/14/2018 PCP: Kirk Ruths, MD   LOS: 2 days   Brief Narrative / Interim history:  78 yo F with history of PE on chronic anticoagulation with Eliquis, CLL, MGUS, HTN, HLD, obesity who was admitted to the hospital on 12/14/2018 with weakness, nausea, abdominal discomfort and diarrhea. Her daughter was recently admitted and discharged from [facility] for Covid. Her GI symptoms have been present for the past week prior to admission.   Subjective: -Ports she is feeling weak today, report she feels more dyspneic as well, reports sore throat, her diarrhea is improving .  Assessment & Plan: Principal Problem:   COVID-19 Active Problems:   Essential hypertension   Chronic pulmonary embolism (HCC)    Acute hypoxic respiratory failure due to COVID-19 for pneumonia -This morning patient was noted to be on 2 L nasal cannula, her oxygen saturation did drop to 88% on room air at rest once I discontinued her oxygen, she is back on 2 L nasal cannula -Continue with IV Solu-Medrol 40 mg every 8 hours -She is hypoxic, CT chest with evidence of bilateral opacity, she will be started on Remdesivir. -CRP trending down which is reassuring, but still significantly elevated at 13.4, will monitor her on steroids and Remdesivir.   COVID-19 Labs  Recent Labs    12/14/18 0750 12/15/18 0145 12/16/18 0125  DDIMER  --  1.06* 0.90*  FERRITIN  --  79 85  LDH  --  268* 227*  CRP 22.3* 15.4* 13.4*   History of saddle PE -Continue Eliquis  Dehydration hypokalemia -Potassium repleted, as well received IV fluids  Hypertension -He is on propranolol, which I will hold given her heart rate in the 50s, I will start on PRN hydralazine, and if needed she will be started on Norvasc  UTI -Continue ceftriaxone for 3 days  Scheduled Meds: . albuterol  2 puff Inhalation Q6H  . apixaban  5 mg Oral BID  . dexamethasone  6 mg Oral Daily  .  escitalopram  10 mg Oral Daily  . gabapentin  300 mg Oral BID  . nystatin   Topical TID  . pantoprazole  40 mg Oral Daily  . pravastatin  40 mg Oral Daily  . propranolol  40 mg Oral BID  . sucralfate  1 g Oral TID WC & HS   Continuous Infusions:  PRN Meds:.acetaminophen, chlorpheniramine-HYDROcodone, guaiFENesin-dextromethorphan, hydrALAZINE, [DISCONTINUED] ondansetron **OR** ondansetron (ZOFRAN) IV, tiZANidine  DVT prophylaxis: Eliquis Code Status: DNR Family Communication: d/w patient  Disposition Plan: home when ready   Consultants:   None   Procedures:   None   Antimicrobials:  None    Objective: Vitals:   12/15/18 1923 12/16/18 0400 12/16/18 0724 12/16/18 0800  BP: (!) 144/78 (!) 151/92 (!) 150/75 (!) 129/53  Pulse: 62 (!) 58  (!) 52  Resp: (!) 22 18 20    Temp: 98.3 F (36.8 C) 97.9 F (36.6 C) 97.8 F (36.6 C)   TempSrc:  Oral Oral   SpO2: 92% 97% 96%   Weight:      Height:        Intake/Output Summary (Last 24 hours) at 12/16/2018 1452 Last data filed at 12/16/2018 1300 Gross per 24 hour  Intake 690 ml  Output 100 ml  Net 590 ml   Filed Weights   12/14/18 0340  Weight: 116.4 kg    Examination:  Awake Alert, Oriented X 3, No new F.N deficits, Normal affect Symmetrical Chest  wall movement, Good air movement bilaterally, CTAB RRR,No Gallops,Rubs or new Murmurs, No Parasternal Heave +ve B.Sounds, Abd Soft, No tenderness, No rebound - guarding or rigidity. No Cyanosis, Clubbing or edema, No new Rash or bruise     Data Reviewed: I have independently reviewed following labs and imaging studies   CBC: Recent Labs  Lab 12/13/18 1646 12/15/18 0145 12/16/18 0125  WBC 9.9 10.9* 9.7  NEUTROABS 5.3 7.0 6.1  HGB 10.8* 10.0* 9.8*  HCT 34.6* 32.5* 32.3*  MCV 92.0 95.3 94.4  PLT 267 259 [id number]   Basic Metabolic Panel: Recent Labs  Lab 12/13/18 1646 12/14/18 0750 12/15/18 0145 12/16/18 0125  NA 139  --  140 142  K 3.1*  --  4.3 4.2  CL 98  --   104 106  CO2 28  --  26 27  GLUCOSE 123*  --  125* 114*  BUN 12  --  19 21  CREATININE 0.96  --  0.84 0.95  CALCIUM 8.2*  --  8.3* 8.4*  MG  --  1.8 1.8 1.9   GFR: Estimated Creatinine Clearance: 64.3 mL/min (by C-G formula based on SCr of 0.95 mg/dL). Liver Function Tests: Recent Labs  Lab 12/13/18 1646 12/15/18 0145 12/16/18 0125  AST 14* 14* 13*  ALT 8 8 10   ALKPHOS 87 79 75  BILITOT 0.4 0.2* 0.3  PROT 6.3* 6.1* 6.3*  ALBUMIN 2.8* 2.8* 2.7*   No results for input(s): LIPASE, AMYLASE in the last 168 hours. No results for input(s): AMMONIA in the last 168 hours. Coagulation Profile: No results for input(s): INR, PROTIME in the last 168 hours. Cardiac Enzymes: No results for input(s): CKTOTAL, CKMB, CKMBINDEX, TROPONINI in the last 168 hours. BNP (last 3 results) No results for input(s): PROBNP in the last 8760 hours. HbA1C: No results for input(s): HGBA1C in the last 72 hours. CBG: No results for input(s): GLUCAP in the last 168 hours. Lipid Profile: No results for input(s): CHOL, HDL, LDLCALC, TRIG, CHOLHDL, LDLDIRECT in the last 72 hours. Thyroid Function Tests: No results for input(s): TSH, T4TOTAL, FREET4, T3FREE, THYROIDAB in the last 72 hours. Anemia Panel: Recent Labs    12/15/18 0145 12/16/18 0125  FERRITIN 79 85   Urine analysis:    Component Value Date/Time   COLORURINE YELLOW (A) 12/13/2018 1648   APPEARANCEUR CLOUDY (A) 12/13/2018 1648   APPEARANCEUR Hazy 04/29/2012 1921   LABSPEC 1.025 12/13/2018 1648   LABSPEC 1.016 04/29/2012 1921   PHURINE 5.0 12/13/2018 1648   GLUCOSEU NEGATIVE 12/13/2018 1648   GLUCOSEU Negative 04/29/2012 1921   HGBUR NEGATIVE 12/13/2018 1648   BILIRUBINUR NEGATIVE 12/13/2018 1648   BILIRUBINUR Negative 04/29/2012 1921   KETONESUR NEGATIVE 12/13/2018 1648   PROTEINUR 30 (A) 12/13/2018 1648   NITRITE NEGATIVE 12/13/2018 1648   LEUKOCYTESUR NEGATIVE 12/13/2018 1648   LEUKOCYTESUR Trace 04/29/2012 1921   Sepsis Labs:  Invalid input(s): PROCALCITONIN, LACTICIDVEN  Recent Results (from the past 240 hour(s))  Urine culture     Status: Abnormal   Collection Time: 12/13/18  4:46 PM   Specimen: Urine, Random  Result Value Ref Range Status   Specimen Description   Final    URINE, RANDOM Performed at [facility], [address], [place], [place] [address]    Special Requests   Final    NONE Performed at [facility], [address] [place], [place] [address]    Culture (A)  Final    >=100,000 COLONIES/mL ESCHERICHIA COLI 30,000 COLONIES/mL STAPHYLOCOCCUS  SIMULANS    Report Status 12/16/2018 FINAL  Final   Organism ID, Bacteria ESCHERICHIA COLI (A)  Final   Organism ID, Bacteria STAPHYLOCOCCUS SIMULANS (A)  Final      Susceptibility   Escherichia coli - MIC*    AMPICILLIN >=32 RESISTANT Resistant     CEFAZOLIN <=4 SENSITIVE Sensitive     CEFTRIAXONE <=1 SENSITIVE Sensitive     CIPROFLOXACIN <=0.25 SENSITIVE Sensitive     GENTAMICIN <=1 SENSITIVE Sensitive     IMIPENEM <=0.25 SENSITIVE Sensitive     NITROFURANTOIN <=16 SENSITIVE Sensitive     TRIMETH/SULFA <=20 SENSITIVE Sensitive     AMPICILLIN/SULBACTAM 16 INTERMEDIATE Intermediate     PIP/TAZO <=4 SENSITIVE Sensitive     Extended ESBL NEGATIVE Sensitive     * >=100,000 COLONIES/mL ESCHERICHIA COLI   Staphylococcus simulans - MIC*    CIPROFLOXACIN <=0.5 SENSITIVE Sensitive     GENTAMICIN <=0.5 SENSITIVE Sensitive     NITROFURANTOIN <=16 SENSITIVE Sensitive     OXACILLIN <=0.25 SENSITIVE Sensitive     TETRACYCLINE <=1 SENSITIVE Sensitive     VANCOMYCIN <=0.5 SENSITIVE Sensitive     TRIMETH/SULFA <=10 SENSITIVE Sensitive     CLINDAMYCIN <=0.25 SENSITIVE Sensitive     RIFAMPIN <=0.5 SENSITIVE Sensitive     Inducible Clindamycin NEGATIVE Sensitive     * 30,000 COLONIES/mL STAPHYLOCOCCUS SIMULANS      Radiology Studies: Ct Chest Wo Contrast  Result Date: 12/16/2018 CLINICAL DATA:  Chronic dyspnea. EXAM: CT CHEST  WITHOUT CONTRAST TECHNIQUE: Multidetector CT imaging of the chest was performed following the standard protocol without IV contrast. COMPARISON:  CT scan of Sep 07, 2018. FINDINGS: Cardiovascular: Atherosclerosis of thoracic aorta is noted without aneurysm formation. Mild cardiomegaly is noted. No pericardial effusion is noted. Mediastinum/Nodes: 11 mm pre vascular lymph node is noted which is unchanged compared to prior exam. 12 mm right paratracheal lymph node is noted which is unchanged compared to prior exam. 25 mm subcarinal lymph node is noted which is enlarged compared to prior exam. Thyroid gland and esophagus are unremarkable. Lungs/Pleura: No pneumothorax is noted. Minimal bilateral pleural effusions are noted with adjacent subsegmental atelectasis. Interval development of multifocal ill-defined airspace opacities are noted throughout both lungs peripherally consistent with multifocal pneumonia, most likely due to viral etiology. Upper Abdomen: No acute abnormality. Musculoskeletal: No chest wall mass or suspicious bone lesions identified. IMPRESSION: Interval development of multiple ill-defined airspace opacities throughout both lungs peripherally most consistent with multifocal pneumonia, most likely due to viral etiology. Minimal bilateral pleural effusions are noted with adjacent subsegmental atelectasis. Stable prevascular and right paratracheal adenopathy is noted, but subcarinal adenopathy is mildly enlarged compared to prior exam, and may be inflammatory in etiology. Aortic Atherosclerosis (ICD10-I70.0). Electronically Signed   By: Marijo Conception M.D.   On: 12/16/2018 14:21   Dg Chest Port 1 View  Result Date: 12/16/2018 CLINICAL DATA:  Hypoxia EXAM: PORTABLE CHEST 1 VIEW COMPARISON:  12/13/2018 FINDINGS: The heart size and mediastinal contours are stable. Similar degree of prominent interstitial markings throughout both lungs. No new focal airspace consolidation. No pleural effusion or  pneumothorax. IMPRESSION: Mild bronchitic changes throughout both lungs, not significantly changed from prior. Electronically Signed   By: Davina Poke M.D.   On: 12/16/2018 10:44    Borromeo Climes MD [facility]  Contact via  [email or web address]  [place] P: [phone] F: [phone]

## 2018-12-16 NOTE — Plan of Care (Signed)
  Problem: Respiratory: Goal: Ability to maintain adequate ventilation will improve Outcome: Progressing Goal: Ability to maintain a clear airway will improve Outcome: Progressing   Pt on o2.  Pt's oxygen level 95% on 2 L .  Chest CT ordered for pt.  Pt encouraged to use IS and flutter valve.

## 2018-12-16 NOTE — Progress Notes (Signed)
  Pharmacy Antibiotic Note  Jamie Phillips is a 78 y.o. female admitted on 12/14/2018 with COVID 19.  Pharmacy has been consulted for Remdesivir dosing.  Chest CT shows Interval development of multiple ill-defined airspace opacities throughout both lungs peripherally most consistent with multifocal pneumonia, most likely due to viral etiology Pt requiring supplemental oxygen (2L [place]) ALT 10  Plan: Remdesivir 200mg  IV now then 100mg  IV daily x 4 days Will f/u ALT and pt's clinical condition   Height: 5\' 5"  (165.1 cm) Weight: 256 lb 9.9 oz (116.4 kg) IBW/kg (Calculated) : 57  Temp (24hrs), Avg:98.1 F (36.7 C), Min:97.8 F (36.6 C), Max:98.5 F (36.9 C)  Recent Labs  Lab 12/13/18 1646 12/15/18 0145 12/16/18 0125  WBC 9.9 10.9* 9.7  CREATININE 0.96 0.84 0.95  LATICACIDVEN 0.9  --   --     Estimated Creatinine Clearance: 64.3 mL/min (by C-G formula based on SCr of 0.95 mg/dL).    Allergies  Allergen Reactions  . Oxycodone Other (See Comments)    Reaction:  Hallucinations  . Morphine Other (See Comments)    Reaction:  Hallucinations    Antimicrobials this admission: 8/24 Rocephin  >> 8/26 8/26 Remdesivir >> 8/30   Thank you for allowing pharmacy to be a part of this patient's care.  Sherlon Handing, PharmD, BCPS CGV Clinical pharmacist phone [phone] 12/16/2018 3:06 PM

## 2018-12-16 NOTE — Progress Notes (Signed)
Pt taken off of o2 by OT during their session.  Spot checked o2 obtained and pt desatted to 89%.  2 L [place] reapplied to pt.  Pt's o2 increased to [id number] after reapplication of o2.

## 2018-12-16 NOTE — Progress Notes (Signed)
Called pt's son to update him on his mothers plan of care.  However, pt's son didn't answer.  Will call again in a few hours.

## 2018-12-17 LAB — CBC WITH DIFFERENTIAL/PLATELET
Abs Immature Granulocytes: 0.9 10*3/uL — ABNORMAL HIGH (ref 0.00–0.07)
Basophils Absolute: 0.1 10*3/uL (ref 0.0–0.1)
Basophils Relative: 1 %
Eosinophils Absolute: 0 10*3/uL (ref 0.0–0.5)
Eosinophils Relative: 0 %
HCT: 34.7 % — ABNORMAL LOW (ref 36.0–46.0)
Hemoglobin: 10.8 g/dL — ABNORMAL LOW (ref 12.0–15.0)
Immature Granulocytes: 7 %
Lymphocytes Relative: 34 %
Lymphs Abs: 4.4 10*3/uL — ABNORMAL HIGH (ref 0.7–4.0)
MCH: 29.1 pg (ref 26.0–34.0)
MCHC: 31.1 g/dL (ref 30.0–36.0)
MCV: 93.5 fL (ref 80.0–100.0)
Monocytes Absolute: 0.5 10*3/uL (ref 0.1–1.0)
Monocytes Relative: 4 %
Neutro Abs: 7 10*3/uL (ref 1.7–7.7)
Neutrophils Relative %: 54 %
Platelets: 330 10*3/uL (ref 150–400)
RBC: 3.71 MIL/uL — ABNORMAL LOW (ref 3.87–5.11)
RDW: 14.6 % (ref 11.5–15.5)
WBC: 12.9 10*3/uL — ABNORMAL HIGH (ref 4.0–10.5)
nRBC: 0 % (ref 0.0–0.2)

## 2018-12-17 LAB — COMPREHENSIVE METABOLIC PANEL
ALT: 12 U/L (ref 0–44)
AST: 21 U/L (ref 15–41)
Albumin: 2.8 g/dL — ABNORMAL LOW (ref 3.5–5.0)
Alkaline Phosphatase: 80 U/L (ref 38–126)
Anion gap: 12 (ref 5–15)
BUN: 26 mg/dL — ABNORMAL HIGH (ref 8–23)
CO2: 24 mmol/L (ref 22–32)
Calcium: 8.8 mg/dL — ABNORMAL LOW (ref 8.9–10.3)
Chloride: 105 mmol/L (ref 98–111)
Creatinine, Ser: 0.79 mg/dL (ref 0.44–1.00)
GFR calc Af Amer: >60 mL/min (ref >60)
GFR calc non Af Amer: >60 mL/min (ref >60)
Glucose, Bld: 122 mg/dL — ABNORMAL HIGH (ref 70–99)
Potassium: 4.9 mmol/L (ref 3.5–5.1)
Sodium: 141 mmol/L (ref 135–145)
Total Bilirubin: 0.2 mg/dL — ABNORMAL LOW (ref 0.3–1.2)
Total Protein: 6.5 g/dL (ref 6.5–8.1)

## 2018-12-17 LAB — BRAIN NATRIURETIC PEPTIDE: B Natriuretic Peptide: 369.2 pg/mL — ABNORMAL HIGH (ref 0.0–100.0)

## 2018-12-17 LAB — MAGNESIUM: Magnesium: 2 mg/dL (ref 1.7–2.4)

## 2018-12-17 LAB — LACTATE DEHYDROGENASE: LDH: 308 U/L — ABNORMAL HIGH (ref 98–192)

## 2018-12-17 LAB — C-REACTIVE PROTEIN: CRP: 8.3 mg/dL — ABNORMAL HIGH (ref <1.0)

## 2018-12-17 LAB — D-DIMER, QUANTITATIVE: D-Dimer, Quant: 1.2 ug/mL-FEU — ABNORMAL HIGH (ref 0.00–0.50)

## 2018-12-17 LAB — FERRITIN: Ferritin: 94 ng/mL (ref 11–307)

## 2018-12-17 MED ORDER — LIP MEDEX EX OINT
TOPICAL_OINTMENT | CUTANEOUS | Status: DC | PRN
  Filled 2018-12-17 (×2): qty 7

## 2018-12-17 MED ORDER — LORAZEPAM 0.5 MG PO TABS
1.0000 mg | ORAL_TABLET | Freq: Once | ORAL | Status: AC
  Administered 2018-12-17: 22:00:00 1 mg via ORAL
  Filled 2018-12-17: qty 2

## 2018-12-17 MED ORDER — METOPROLOL TARTRATE 25 MG PO TABS: 25.0000 mg | ORAL_TABLET | Freq: Once | ORAL | Status: DC

## 2018-12-17 MED ORDER — LORAZEPAM 0.5 MG PO TABS
1.0000 mg | ORAL_TABLET | Freq: Once | ORAL | Status: AC
  Administered 2018-12-17: 01:00:00 1 mg via ORAL
  Filled 2018-12-17: qty 2

## 2018-12-17 NOTE — Progress Notes (Signed)
Pt was c/o headache and her SBP was 187. Hydralazine and tylenol given. SBP now 155 pt is reportiong relief

## 2018-12-17 NOTE — Progress Notes (Signed)
Called pt's son to update him on his mothers plan of care.  He appreciated the update.

## 2018-12-17 NOTE — Progress Notes (Signed)
OT Treatment Note  Pt with increased WOB and 2/4 dyspnea during functional activity today. Pt states "I always have a hard time breathing because I'm fat, but I feel worse today".  Desat on RA during activity to 88. Pt states she didn't sleep well last night, but demonstrates decreased tolerance for tasks, requiring increased assistance for ADL. Updating plant to [facility] and [place] [facility] [place].  Will continue to follow acutely. Encouraged use of IS x 10 q hr.     12/17/18 0900  OT Visit Information  Last OT Received On 12/17/18  Assistance Needed +1  History of Present Illness 78 y.o. 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 Patient's daughter was just discharged from [facility] for [place]. Pt tested +COVID 11/30/18. +dehydration and hypokalemia  Precautions  Precautions Fall  Precaution Comments denies falls; uses walker for safety due to blood thinners   Pain Assessment  Pain Assessment No/denies pain  Cognition  Arousal/Alertness Awake/alert  Behavior During Therapy WFL for tasks assessed/performed  Overall Cognitive Status Within Functional Limits for tasks assessed  General Comments sleepy; didn't sleep well last night  Upper Extremity Assessment  Upper Extremity Assessment Generalized weakness  Lower Extremity Assessment  Lower Extremity Assessment Defer to PT evaluation  ADL  Overall ADL's  Needs assistance/impaired  Upper Body Bathing Set up;Sitting  Lower Body Bathing Minimal assistance;Sit to/from stand  Lower Body Dressing Sit to/from stand;Moderate assistance  Toilet Transfer Ambulation;RW;Minimal assistance  Toileting- Water quality scientist and Hygiene Sit to/from stand;Moderate assistance  Functional mobility during ADLs Rolling walker;Minimal assistance;Cueing for safety  General ADL Comments cues for safe descent to toilet instead of falling onto toilet; began education on use of AE  to assiswt wtih LB ADL; more fatigued during ADL; poor endurance; increaed DOE  Bed Mobility  General bed mobility comments OOB in chair  Balance  Overall balance assessment Needs assistance  Sitting balance-Leahy Scale Good  Standing balance-Leahy Scale Fair  Transfers  Overall transfer level Needs assistance  Transfers Sit to/from Stand  Sit to Stand Min assist  Other Exercises  Other Exercises educated in use of IS x 10 breaths  - pulling 1000 ml  OT - End of Session  Equipment Utilized During Treatment Oxygen  Activity Tolerance Patient tolerated treatment well  Patient left in chair;with call bell/phone within reach  Nurse Communication Mobility status  OT Assessment/Plan  OT Plan Discharge plan needs to be updated;Frequency remains appropriate  OT Visit Diagnosis Unsteadiness on feet (R26.81);Muscle weakness (generalized) (M62.81)  OT Frequency (ACUTE ONLY) Min 3X/week  Follow Up Recommendations Home health OT;Supervision - Intermittent  OT Equipment None recommended by OT  AM-PAC OT "6 Clicks" Daily Activity Outcome Measure (Version 2)  Help from another person eating meals? 4  Help from another person taking care of personal grooming? 3  Help from another person toileting, which includes using toliet, bedpan, or urinal? 3  Help from another person bathing (including washing, rinsing, drying)? 2  Help from another person to put on and taking off regular upper body clothing? 3  Help from another person to put on and taking off regular lower body clothing? 2  6 Click Score 17  OT Goal Progression  Progress towards OT goals Not progressing toward goals - comment (increaesd fatigue and dyspnea)  Acute Rehab OT Goals  Patient Stated Goal regain her strength to be able to keep up with grandchild  OT  Goal Formulation With patient  Time For Goal Achievement 12/29/18  Potential to Achieve Goals Good  ADL Goals  Pt Will Perform Lower Body Bathing with modified independence;sit  to/from stand;with adaptive equipment  Pt Will Perform Lower Body Dressing sit to/from stand;with modified independence;with adaptive equipment  Pt Will Transfer to Toilet with modified independence;ambulating  Additional ADL Goal #1 Pt will independently verbalize 3 energy conservation strategies  Additional ADL Goal #2 Pt will independently verbalzie 3 strategies to reduce risk of falls  OT Time Calculation  OT Start Time (ACUTE ONLY) 0930  OT Stop Time (ACUTE ONLY) 1000  OT Time Calculation (min) 30 min  OT General Charges  $OT Visit 1 Visit  OT Treatments  $Self Care/Home Management  23-37 mins  Maurie Boettcher, OT/L   Acute OT Clinical Specialist [place] Pager [phone] Office [phone]

## 2018-12-17 NOTE — Progress Notes (Signed)
Pt has become very panicky since she used the [facility]. She transferred without any issues but now back in bed, her anxiety is out of control as she feels she cant catch her breath. I have been coaching her breathing, talking to her and trying diversions with no effect. Dr Olevia Bowens updated, awaitng orders

## 2018-12-17 NOTE — Progress Notes (Signed)
Physical Therapy Treatment Patient Details Name: Jamie Phillips MRN: [id number] DOB: 1940/05/05 Today's Date: 12/17/2018    History of Present Illness 78 y.o. 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 Patient's daughter was just discharged from [facility] for [place]. Pt tested +COVID 11/30/18. +dehydration and hypokalemia    PT Comments    Patient doing better this afternoon. She tolerated walking 150 ft with rollator on room air with sats 94-89%.    Follow Up Recommendations  Home health PT;Supervision for mobility/OOB     Equipment Recommendations  None recommended by PT    Recommendations for Other Services       Precautions / Restrictions Precautions Precautions: Fall Precaution Comments: denies falls; uses walker for safety due to blood thinners     Mobility  Bed Mobility               General bed mobility comments: OOB in chair  Transfers Overall transfer level: Needs assistance Equipment used: 4-wheeled walker Transfers: Sit to/from Stand;Stand Pivot Transfers Sit to Stand: Min guard Stand pivot transfers: Min guard       General transfer comment: vc for safe hand placement with rollator; pivot without device with BSC right beside chair  Ambulation/Gait Ambulation/Gait assistance: Supervision Gait Distance (Feet): 150 Feet Assistive device: 4-wheeled walker Gait Pattern/deviations: Step-through pattern;Decreased stride length   Gait velocity interpretation: 1.31 - 2.62 ft/sec, indicative of limited community ambulator General Gait Details: better velocity compared to last visit; able to maintain control of rollator without assist   Stairs             Wheelchair Mobility    Modified Rankin (Stroke Patients Only)       Balance Overall balance assessment: Needs assistance   Sitting balance-Leahy Scale: Good       Standing balance-Leahy  Scale: Fair                              Cognition Arousal/Alertness: Awake/alert Behavior During Therapy: WFL for tasks assessed/performed Overall Cognitive Status: Within Functional Limits for tasks assessed                                        Exercises Other Exercises Other Exercises: educated in use of IS x 10 breaths      General Comments        Pertinent Vitals/Pain Pain Assessment: No/denies pain    Home Living Family/patient expects to be discharged to:: Private residence Living Arrangements: Children(40 something old son moved in after divorce; ) Available Help at Discharge: Family;Available PRN/intermittently(works 3 rd shift) Type of Home: House Home Access: Stairs to enter Entrance Stairs-Rails: Right;Left Home Layout: One level Home Equipment: Bedside commode;Grab bars - tub/shower;Shower seat;Walker - 2 wheels;Walker - 4 wheels      Prior Function Level of Independence: Independent with assistive device(s)      Comments: uses rollator; drives; does all but yard work because son won't let her   PT Goals (current goals can now be found in the care plan section) Acute Rehab PT Goals Patient Stated Goal: regain her strength to be able to keep up with grandchild Time For Goal Achievement: 12/29/18 Potential to Achieve Goals: Good Progress towards PT goals: Progressing toward goals    Frequency  Min 3X/week      PT Plan Current plan remains appropriate    Co-evaluation              AM-PAC PT "6 Clicks" Mobility   Outcome Measure  Help needed turning from your back to your side while in a flat bed without using bedrails?: A Little Help needed moving from lying on your back to sitting on the side of a flat bed without using bedrails?: A Little Help needed moving to and from a bed to a chair (including a wheelchair)?: A Little Help needed standing up from a chair using your arms (e.g., wheelchair or bedside  chair)?: None Help needed to walk in hospital room?: A Little Help needed climbing 3-5 steps with a railing? : A Little 6 Click Score: 19    End of Session   Activity Tolerance: Patient tolerated treatment well Patient left: in chair;with call bell/phone within reach Nurse Communication: Mobility status PT Visit Diagnosis: Muscle weakness (generalized) (M62.81);Difficulty in walking, not elsewhere classified (R26.2)     Time: [id number] PT Time Calculation (min) (ACUTE ONLY): 35 min  Charges:  $Gait Training: 23-37 mins                       Barry Brunner, PT       Rexanne Mano 12/17/2018, 5:14 PM

## 2018-12-17 NOTE — Progress Notes (Signed)
PROGRESS NOTE  Jamie Phillips [id number] DOB: November 30, 1940 DOA: 12/14/2018 PCP: Kirk Ruths, MD   LOS: 3 days   Brief Narrative / Interim history:  78 yo F with history of PE on chronic anticoagulation with Eliquis, CLL, MGUS, HTN, HLD, obesity who was admitted to the hospital on 12/14/2018 with weakness, nausea, abdominal discomfort and diarrhea. Her daughter was recently admitted and discharged from [facility] for [place]. Her GI symptoms have been present for the past week prior to admission.   Subjective: -Ports she is feeling weak today, report she feels more dyspneic as well, reports sore throat, her diarrhea is improving .  Assessment & Plan: Principal Problem:   COVID-19 Active Problems:   Essential hypertension   Chronic pulmonary embolism (HCC)    Acute hypoxic respiratory failure due to COVID-19 for pneumonia -Remains on 2 L nasal cannula, will wean as tolerated . -Continue with IV Solu-Medrol -She is hypoxic, CT chest with evidence of bilateral opacity, Remdesivir 8/26 -Continue to trend inflammatory markers, CRP trending down, but LDH and D-dimers are going up, will continue to monitor closely   COVID-19 Labs  Recent Labs    12/15/18 0145 12/16/18 0125 12/17/18 0110  DDIMER 1.06* 0.90* 1.20*  FERRITIN 79 85 94  LDH 268* 227* 308*  CRP 15.4* 13.4* 8.3*   History of saddle PE -Continue Eliquis  Dehydration hypokalemia -Potassium repleted, as well received IV fluids  Hypertension -Ropinirole on hold given bradycardia, blood pressure is acceptable, continue with PRN hydralazine for now   UTI -Continue ceftriaxone for 3 days  Scheduled Meds: . albuterol  2 puff Inhalation Q6H  . apixaban  5 mg Oral BID  . escitalopram  10 mg Oral Daily  . gabapentin  300 mg Oral BID  . methylPREDNISolone (SOLU-MEDROL) injection  40 mg Intravenous Q8H  . metoprolol tartrate  25 mg Oral Once  . nystatin   Topical TID  . pantoprazole  40 mg Oral Daily  .  pravastatin  40 mg Oral Daily  . sucralfate  1 g Oral TID WC & HS   Continuous Infusions: . remdesivir 100 mg in NS 250 mL     PRN Meds:.acetaminophen, chlorpheniramine-HYDROcodone, guaiFENesin-dextromethorphan, hydrALAZINE, [DISCONTINUED] ondansetron **OR** ondansetron (ZOFRAN) IV, tiZANidine  DVT prophylaxis: Eliquis Code Status: DNR Family Communication: d/w patient, tried to call son, no answer. Disposition Plan: home when ready   Consultants:   None   Procedures:   None   Antimicrobials:  None    Objective: Vitals:   12/16/18 1931 12/17/18 0000 12/17/18 0609 12/17/18 0745  BP: (!) 162/79 (!) 174/88  140/86  Pulse: (!) 59 64 (!) 59 73  Resp:    20  Temp: (!) 97.4 F (36.3 C)     TempSrc: Oral     SpO2: 94%   93%  Weight:      Height:        Intake/Output Summary (Last 24 hours) at 12/17/2018 1402 Last data filed at 12/17/2018 0855 Gross per 24 hour  Intake 480 ml  Output 1645 ml  Net -1165 ml   Filed Weights   12/14/18 0340  Weight: 116.4 kg    Examination:  Awake Alert, Oriented X 3, No new F.N deficits, Normal affect, sitting in recliner eating breakfast Symmetrical Chest wall movement, Good air movement bilaterally, CTAB RRR,No Gallops,Rubs or new Murmurs, No Parasternal Heave +ve B.Sounds, Abd Soft, No tenderness, No rebound - guarding or rigidity. No Cyanosis, Clubbing or edema, No new Rash or bruise  Data Reviewed: I have independently reviewed following labs and imaging studies   CBC: Recent Labs  Lab 12/13/18 1646 12/15/18 0145 12/16/18 0125 12/17/18 0110  WBC 9.9 10.9* 9.7 12.9*  NEUTROABS 5.3 7.0 6.1 7.0  HGB 10.8* 10.0* 9.8* 10.8*  HCT 34.6* 32.5* 32.3* 34.7*  MCV 92.0 95.3 94.4 93.5  PLT 267 259 279 [id number]   Basic Metabolic Panel: Recent Labs  Lab 12/13/18 1646 12/14/18 0750 12/15/18 0145 12/16/18 0125 12/17/18 0110  NA 139  --  140 142 141  K 3.1*  --  4.3 4.2 4.9  CL 98  --  104 106 105  CO2 28  --  26 27 24    GLUCOSE 123*  --  125* 114* 122*  BUN 12  --  19 21 26*  CREATININE 0.96  --  0.84 0.95 0.79  CALCIUM 8.2*  --  8.3* 8.4* 8.8*  MG  --  1.8 1.8 1.9 2.0   GFR: Estimated Creatinine Clearance: 76.3 mL/min (by C-G formula based on SCr of 0.79 mg/dL). Liver Function Tests: Recent Labs  Lab 12/13/18 1646 12/15/18 0145 12/16/18 0125 12/17/18 0110  AST 14* 14* 13* 21  ALT 8 8 10 12   ALKPHOS 87 79 75 80  BILITOT 0.4 0.2* 0.3 0.2*  PROT 6.3* 6.1* 6.3* 6.5  ALBUMIN 2.8* 2.8* 2.7* 2.8*   No results for input(s): LIPASE, AMYLASE in the last 168 hours. No results for input(s): AMMONIA in the last 168 hours. Coagulation Profile: No results for input(s): INR, PROTIME in the last 168 hours. Cardiac Enzymes: No results for input(s): CKTOTAL, CKMB, CKMBINDEX, TROPONINI in the last 168 hours. BNP (last 3 results) No results for input(s): PROBNP in the last 8760 hours. HbA1C: No results for input(s): HGBA1C in the last 72 hours. CBG: No results for input(s): GLUCAP in the last 168 hours. Lipid Profile: No results for input(s): CHOL, HDL, LDLCALC, TRIG, CHOLHDL, LDLDIRECT in the last 72 hours. Thyroid Function Tests: No results for input(s): TSH, T4TOTAL, FREET4, T3FREE, THYROIDAB in the last 72 hours. Anemia Panel: Recent Labs    12/16/18 0125 12/17/18 0110  FERRITIN 85 94   Urine analysis:    Component Value Date/Time   COLORURINE YELLOW (A) 12/13/2018 1648   APPEARANCEUR CLOUDY (A) 12/13/2018 1648   APPEARANCEUR Hazy 04/29/2012 1921   LABSPEC 1.025 12/13/2018 1648   LABSPEC 1.016 04/29/2012 1921   PHURINE 5.0 12/13/2018 1648   GLUCOSEU NEGATIVE 12/13/2018 1648   GLUCOSEU Negative 04/29/2012 1921   HGBUR NEGATIVE 12/13/2018 1648   BILIRUBINUR NEGATIVE 12/13/2018 1648   BILIRUBINUR Negative 04/29/2012 1921   KETONESUR NEGATIVE 12/13/2018 1648   PROTEINUR 30 (A) 12/13/2018 1648   NITRITE NEGATIVE 12/13/2018 1648   LEUKOCYTESUR NEGATIVE 12/13/2018 1648   LEUKOCYTESUR Trace  04/29/2012 1921   Sepsis Labs: Invalid input(s): PROCALCITONIN, LACTICIDVEN  Recent Results (from the past 240 hour(s))  Urine culture     Status: Abnormal   Collection Time: 12/13/18  4:46 PM   Specimen: Urine, Random  Result Value Ref Range Status   Specimen Description   Final    URINE, RANDOM Performed at [facility], [address], [place], [place] [address]    Special Requests   Final    NONE Performed at [facility], [address] [place], [place] [address]    Culture (A)  Final    >=100,000 COLONIES/mL ESCHERICHIA COLI 30,000 COLONIES/mL STAPHYLOCOCCUS SIMULANS    Report Status 12/16/2018 FINAL  Final   Organism ID,  Bacteria ESCHERICHIA COLI (A)  Final   Organism ID, Bacteria STAPHYLOCOCCUS SIMULANS (A)  Final      Susceptibility   Escherichia coli - MIC*    AMPICILLIN >=32 RESISTANT Resistant     CEFAZOLIN <=4 SENSITIVE Sensitive     CEFTRIAXONE <=1 SENSITIVE Sensitive     CIPROFLOXACIN <=0.25 SENSITIVE Sensitive     GENTAMICIN <=1 SENSITIVE Sensitive     IMIPENEM <=0.25 SENSITIVE Sensitive     NITROFURANTOIN <=16 SENSITIVE Sensitive     TRIMETH/SULFA <=20 SENSITIVE Sensitive     AMPICILLIN/SULBACTAM 16 INTERMEDIATE Intermediate     PIP/TAZO <=4 SENSITIVE Sensitive     Extended ESBL NEGATIVE Sensitive     * >=100,000 COLONIES/mL ESCHERICHIA COLI   Staphylococcus simulans - MIC*    CIPROFLOXACIN <=0.5 SENSITIVE Sensitive     GENTAMICIN <=0.5 SENSITIVE Sensitive     NITROFURANTOIN <=16 SENSITIVE Sensitive     OXACILLIN <=0.25 SENSITIVE Sensitive     TETRACYCLINE <=1 SENSITIVE Sensitive     VANCOMYCIN <=0.5 SENSITIVE Sensitive     TRIMETH/SULFA <=10 SENSITIVE Sensitive     CLINDAMYCIN <=0.25 SENSITIVE Sensitive     RIFAMPIN <=0.5 SENSITIVE Sensitive     Inducible Clindamycin NEGATIVE Sensitive     * 30,000 COLONIES/mL STAPHYLOCOCCUS SIMULANS      Radiology Studies: Ct Chest Wo Contrast  Result Date: 12/16/2018 CLINICAL DATA:   Chronic dyspnea. EXAM: CT CHEST WITHOUT CONTRAST TECHNIQUE: Multidetector CT imaging of the chest was performed following the standard protocol without IV contrast. COMPARISON:  CT scan of Sep 07, 2018. FINDINGS: Cardiovascular: Atherosclerosis of thoracic aorta is noted without aneurysm formation. Mild cardiomegaly is noted. No pericardial effusion is noted. Mediastinum/Nodes: 11 mm pre vascular lymph node is noted which is unchanged compared to prior exam. 12 mm right paratracheal lymph node is noted which is unchanged compared to prior exam. 25 mm subcarinal lymph node is noted which is enlarged compared to prior exam. Thyroid gland and esophagus are unremarkable. Lungs/Pleura: No pneumothorax is noted. Minimal bilateral pleural effusions are noted with adjacent subsegmental atelectasis. Interval development of multifocal ill-defined airspace opacities are noted throughout both lungs peripherally consistent with multifocal pneumonia, most likely due to viral etiology. Upper Abdomen: No acute abnormality. Musculoskeletal: No chest wall mass or suspicious bone lesions identified. IMPRESSION: Interval development of multiple ill-defined airspace opacities throughout both lungs peripherally most consistent with multifocal pneumonia, most likely due to viral etiology. Minimal bilateral pleural effusions are noted with adjacent subsegmental atelectasis. Stable prevascular and right paratracheal adenopathy is noted, but subcarinal adenopathy is mildly enlarged compared to prior exam, and may be inflammatory in etiology. Aortic Atherosclerosis (ICD10-I70.0). Electronically Signed   By: Marijo Conception M.D.   On: 12/16/2018 14:21   Dg Chest Port 1 View  Result Date: 12/16/2018 CLINICAL DATA:  Hypoxia EXAM: PORTABLE CHEST 1 VIEW COMPARISON:  12/13/2018 FINDINGS: The heart size and mediastinal contours are stable. Similar degree of prominent interstitial markings throughout both lungs. No new focal airspace  consolidation. No pleural effusion or pneumothorax. IMPRESSION: Mild bronchitic changes throughout both lungs, not significantly changed from prior. Electronically Signed   By: Davina Poke M.D.   On: 12/16/2018 10:44    Carignan Climes MD [facility]  Contact via  [email or web address]  [place] P: [phone] F: [phone]

## 2018-12-17 NOTE — Plan of Care (Signed)
  Problem: Respiratory: Goal: Ability to maintain adequate ventilation will improve Outcome: Progressing Goal: Ability to maintain a clear airway will improve Outcome: Progressing   Pt using incentive spirometer and flutter valve every hour.  Pt on 2 L [place] satting at 94%.

## 2018-12-18 ENCOUNTER — Encounter (HOSPITAL_COMMUNITY): Payer: Self-pay

## 2018-12-18 LAB — CBC WITH DIFFERENTIAL/PLATELET
Abs Immature Granulocytes: 0.45 10*3/uL — ABNORMAL HIGH (ref 0.00–0.07)
Basophils Absolute: 0 10*3/uL (ref 0.0–0.1)
Basophils Relative: 0 %
Eosinophils Absolute: 0 10*3/uL (ref 0.0–0.5)
Eosinophils Relative: 0 %
HCT: 35.2 % — ABNORMAL LOW (ref 36.0–46.0)
Hemoglobin: 10.9 g/dL — ABNORMAL LOW (ref 12.0–15.0)
Immature Granulocytes: 3 %
Lymphocytes Relative: 31 %
Lymphs Abs: 4.2 10*3/uL — ABNORMAL HIGH (ref 0.7–4.0)
MCH: 28.6 pg (ref 26.0–34.0)
MCHC: 31 g/dL (ref 30.0–36.0)
MCV: 92.4 fL (ref 80.0–100.0)
Monocytes Absolute: 0.8 10*3/uL (ref 0.1–1.0)
Monocytes Relative: 6 %
Neutro Abs: 8.3 10*3/uL — ABNORMAL HIGH (ref 1.7–7.7)
Neutrophils Relative %: 60 %
Platelets: 368 10*3/uL (ref 150–400)
RBC: 3.81 MIL/uL — ABNORMAL LOW (ref 3.87–5.11)
RDW: 14.7 % (ref 11.5–15.5)
WBC: 13.7 10*3/uL — ABNORMAL HIGH (ref 4.0–10.5)
nRBC: 0 % (ref 0.0–0.2)

## 2018-12-18 LAB — COMPREHENSIVE METABOLIC PANEL
ALT: 13 U/L (ref 0–44)
AST: 17 U/L (ref 15–41)
Albumin: 2.6 g/dL — ABNORMAL LOW (ref 3.5–5.0)
Alkaline Phosphatase: 76 U/L (ref 38–126)
Anion gap: 9 (ref 5–15)
BUN: 33 mg/dL — ABNORMAL HIGH (ref 8–23)
CO2: 26 mmol/L (ref 22–32)
Calcium: 8.9 mg/dL (ref 8.9–10.3)
Chloride: 108 mmol/L (ref 98–111)
Creatinine, Ser: 1 mg/dL (ref 0.44–1.00)
GFR calc Af Amer: >60 mL/min (ref >60)
GFR calc non Af Amer: 55 mL/min — ABNORMAL LOW (ref >60)
Glucose, Bld: 163 mg/dL — ABNORMAL HIGH (ref 70–99)
Potassium: 4.5 mmol/L (ref 3.5–5.1)
Sodium: 143 mmol/L (ref 135–145)
Total Bilirubin: 0.3 mg/dL (ref 0.3–1.2)
Total Protein: 6.1 g/dL — ABNORMAL LOW (ref 6.5–8.1)

## 2018-12-18 LAB — MAGNESIUM: Magnesium: 1.9 mg/dL (ref 1.7–2.4)

## 2018-12-18 LAB — C-REACTIVE PROTEIN: CRP: 4.4 mg/dL — ABNORMAL HIGH (ref <1.0)

## 2018-12-18 LAB — D-DIMER, QUANTITATIVE: D-Dimer, Quant: 1.45 ug/mL-FEU — ABNORMAL HIGH (ref 0.00–0.50)

## 2018-12-18 LAB — BRAIN NATRIURETIC PEPTIDE: B Natriuretic Peptide: 298.5 pg/mL — ABNORMAL HIGH (ref 0.0–100.0)

## 2018-12-18 LAB — FERRITIN: Ferritin: 86 ng/mL (ref 11–307)

## 2018-12-18 LAB — LACTATE DEHYDROGENASE: LDH: 237 U/L — ABNORMAL HIGH (ref 98–192)

## 2018-12-18 MED ORDER — LORAZEPAM 0.5 MG PO TABS
1.0000 mg | ORAL_TABLET | Freq: Once | ORAL | Status: AC
  Administered 2018-12-18: 1 mg via ORAL
  Filled 2018-12-18: qty 2

## 2018-12-18 MED ORDER — AMLODIPINE BESYLATE 5 MG PO TABS
10.0000 mg | ORAL_TABLET | Freq: Every day | ORAL | Status: DC
  Administered 2018-12-18 – 2018-12-20 (×3): 10 mg via ORAL
  Filled 2018-12-18 (×3): qty 2

## 2018-12-18 NOTE — Plan of Care (Signed)
POC reviewed with patient and family via phone

## 2018-12-18 NOTE — Progress Notes (Signed)
PROGRESS NOTE  Jamie Phillips [id number] DOB: 09-08-1940 DOA: 12/14/2018 PCP: Kirk Ruths, MD   LOS: 4 days   Brief Narrative / Interim history:  78 yo F with history of PE on chronic anticoagulation with Eliquis, CLL, MGUS, HTN, HLD, obesity who was admitted to the hospital on 12/14/2018 with weakness, nausea, abdominal discomfort and diarrhea. Her daughter was recently admitted and discharged from [facility] for Covid. Her GI symptoms have been present for the past week prior to admission.   Subjective: -she is feeling weak today, report she feels more dyspneic as well, reports sore throat, her diarrhea is improving .  Assessment & Plan: Principal Problem:   COVID-19 Active Problems:   Essential hypertension   Chronic pulmonary embolism (HCC)    Acute hypoxic respiratory failure due to COVID-19 for pneumonia -She remains on 2 L nasal cannula today, wean as tolerated -Continue with IV Solu-Medrol, will start to taper today -She is hypoxic, CT chest with evidence of bilateral opacity, Remdesivir started 8/26 -Continue to trend inflammatory markers, CRP trending down, but LDH and D-dimers are going up, will continue to monitor closely   COVID-19 Labs  Recent Labs    12/16/18 0125 12/17/18 0110 12/18/18 0101  DDIMER 0.90* 1.20* 1.45*  FERRITIN 85 94 86  LDH 227* 308* 237*  CRP 13.4* 8.3* 4.4*   History of saddle PE -Continue Eliquis  Dehydration hypokalemia -Potassium repleted, as well received IV fluids  Hypertension -Propranolol on hold given bradycardia, blood pressure is elevated, will start on Norvasc 10 mg oral daily, continue with PRN hydralazine   Sinus bradycardia  -Usually when she is sleeping, asymptomatic, continue to monitor on telemetry, beta-blockers has been stopped .  UTI -Continue ceftriaxone for 3 days  Scheduled Meds: . albuterol  2 puff Inhalation Q6H  . apixaban  5 mg Oral BID  . escitalopram  10 mg Oral Daily  . gabapentin  300 mg  Oral BID  . methylPREDNISolone (SOLU-MEDROL) injection  40 mg Intravenous Q8H  . metoprolol tartrate  25 mg Oral Once  . nystatin   Topical TID  . pantoprazole  40 mg Oral Daily  . pravastatin  40 mg Oral Daily  . sucralfate  1 g Oral TID WC & HS   Continuous Infusions: . remdesivir 100 mg in NS 250 mL Stopped (12/17/18 1703)   PRN Meds:.acetaminophen, chlorpheniramine-HYDROcodone, guaiFENesin-dextromethorphan, hydrALAZINE, lip balm, [DISCONTINUED] ondansetron **OR** ondansetron (ZOFRAN) IV, tiZANidine  DVT prophylaxis: Eliquis Code Status: DNR Family Communication: d/w patient,  Disposition Plan: home when ready   Consultants:   None   Procedures:   None   Antimicrobials:  None    Objective: Vitals:   12/17/18 2024 12/17/18 2037 12/18/18 0445 12/18/18 0800  BP: (!) 187/85 (!) 155/62 (!) 154/70 (!) 156/70  Pulse:   (!) 47 (!) 38  Resp:    20  Temp:   98.4 F (36.9 C) 98.6 F (37 C)  TempSrc:   Oral Oral  SpO2:   97% 95%  Weight:      Height:        Intake/Output Summary (Last 24 hours) at 12/18/2018 1359 Last data filed at 12/18/2018 0800 Gross per 24 hour  Intake 830 ml  Output -  Net 830 ml   Filed Weights   12/14/18 0340  Weight: 116.4 kg    Examination:  Awake Alert, Oriented X 3, No new F.N deficits, Normal affect Symmetrical Chest wall movement, Good air movement bilaterally, CTAB RRR,No Gallops,Rubs or new  Murmurs, No Parasternal Heave +ve B.Sounds, Abd Soft, No tenderness, No rebound - guarding or rigidity. No Cyanosis, Clubbing or edema, No new Rash or bruise       Data Reviewed: I have independently reviewed following labs and imaging studies   CBC: Recent Labs  Lab 12/13/18 1646 12/15/18 0145 12/16/18 0125 12/17/18 0110 12/18/18 0101  WBC 9.9 10.9* 9.7 12.9* 13.7*  NEUTROABS 5.3 7.0 6.1 7.0 8.3*  HGB 10.8* 10.0* 9.8* 10.8* 10.9*  HCT 34.6* 32.5* 32.3* 34.7* 35.2*  MCV 92.0 95.3 94.4 93.5 92.4  PLT 267 259 279 330 [id number]    Basic Metabolic Panel: Recent Labs  Lab 12/13/18 1646 12/14/18 0750 12/15/18 0145 12/16/18 0125 12/17/18 0110 12/18/18 0101  NA 139  --  140 142 141 143  K 3.1*  --  4.3 4.2 4.9 4.5  CL 98  --  104 106 105 108  CO2 28  --  26 27 24 26   GLUCOSE 123*  --  125* 114* 122* 163*  BUN 12  --  19 21 26* 33*  CREATININE 0.96  --  0.84 0.95 0.79 1.00  CALCIUM 8.2*  --  8.3* 8.4* 8.8* 8.9  MG  --  1.8 1.8 1.9 2.0 1.9   GFR: Estimated Creatinine Clearance: 61 mL/min (by C-G formula based on SCr of 1 mg/dL). Liver Function Tests: Recent Labs  Lab 12/13/18 1646 12/15/18 0145 12/16/18 0125 12/17/18 0110 12/18/18 0101  AST 14* 14* 13* 21 17  ALT 8 8 10 12 13   ALKPHOS 87 79 75 80 76  BILITOT 0.4 0.2* 0.3 0.2* 0.3  PROT 6.3* 6.1* 6.3* 6.5 6.1*  ALBUMIN 2.8* 2.8* 2.7* 2.8* 2.6*   No results for input(s): LIPASE, AMYLASE in the last 168 hours. No results for input(s): AMMONIA in the last 168 hours. Coagulation Profile: No results for input(s): INR, PROTIME in the last 168 hours. Cardiac Enzymes: No results for input(s): CKTOTAL, CKMB, CKMBINDEX, TROPONINI in the last 168 hours. BNP (last 3 results) No results for input(s): PROBNP in the last 8760 hours. HbA1C: No results for input(s): HGBA1C in the last 72 hours. CBG: No results for input(s): GLUCAP in the last 168 hours. Lipid Profile: No results for input(s): CHOL, HDL, LDLCALC, TRIG, CHOLHDL, LDLDIRECT in the last 72 hours. Thyroid Function Tests: No results for input(s): TSH, T4TOTAL, FREET4, T3FREE, THYROIDAB in the last 72 hours. Anemia Panel: Recent Labs    12/17/18 0110 12/18/18 0101  FERRITIN 94 86   Urine analysis:    Component Value Date/Time   COLORURINE YELLOW (A) 12/13/2018 1648   APPEARANCEUR CLOUDY (A) 12/13/2018 1648   APPEARANCEUR Hazy 04/29/2012 1921   LABSPEC 1.025 12/13/2018 1648   LABSPEC 1.016 04/29/2012 1921   PHURINE 5.0 12/13/2018 1648   GLUCOSEU NEGATIVE 12/13/2018 1648   GLUCOSEU Negative  04/29/2012 1921   HGBUR NEGATIVE 12/13/2018 1648   BILIRUBINUR NEGATIVE 12/13/2018 1648   BILIRUBINUR Negative 04/29/2012 1921   KETONESUR NEGATIVE 12/13/2018 1648   PROTEINUR 30 (A) 12/13/2018 1648   NITRITE NEGATIVE 12/13/2018 1648   LEUKOCYTESUR NEGATIVE 12/13/2018 1648   LEUKOCYTESUR Trace 04/29/2012 1921   Sepsis Labs: Invalid input(s): PROCALCITONIN, LACTICIDVEN  Recent Results (from the past 240 hour(s))  Urine culture     Status: Abnormal   Collection Time: 12/13/18  4:46 PM   Specimen: Urine, Random  Result Value Ref Range Status   Specimen Description   Final    URINE, RANDOM Performed at [facility], [address] [place], [place] [address]    Special Requests   Final    NONE Performed at [facility], [address] [place], [place] [address]    Culture (A)  Final    >=100,000 COLONIES/mL ESCHERICHIA COLI 30,000 COLONIES/mL STAPHYLOCOCCUS SIMULANS    Report Status 12/16/2018 FINAL  Final   Organism ID, Bacteria ESCHERICHIA COLI (A)  Final   Organism ID, Bacteria STAPHYLOCOCCUS SIMULANS (A)  Final      Susceptibility   Escherichia coli - MIC*    AMPICILLIN >=32 RESISTANT Resistant     CEFAZOLIN <=4 SENSITIVE Sensitive     CEFTRIAXONE <=1 SENSITIVE Sensitive     CIPROFLOXACIN <=0.25 SENSITIVE Sensitive     GENTAMICIN <=1 SENSITIVE Sensitive     IMIPENEM <=0.25 SENSITIVE Sensitive     NITROFURANTOIN <=16 SENSITIVE Sensitive     TRIMETH/SULFA <=20 SENSITIVE Sensitive     AMPICILLIN/SULBACTAM 16 INTERMEDIATE Intermediate     PIP/TAZO <=4 SENSITIVE Sensitive     Extended ESBL NEGATIVE Sensitive     * >=100,000 COLONIES/mL ESCHERICHIA COLI   Staphylococcus simulans - MIC*    CIPROFLOXACIN <=0.5 SENSITIVE Sensitive     GENTAMICIN <=0.5 SENSITIVE Sensitive     NITROFURANTOIN <=16 SENSITIVE Sensitive     OXACILLIN <=0.25 SENSITIVE Sensitive     TETRACYCLINE <=1 SENSITIVE Sensitive     VANCOMYCIN <=0.5 SENSITIVE Sensitive      TRIMETH/SULFA <=10 SENSITIVE Sensitive     CLINDAMYCIN <=0.25 SENSITIVE Sensitive     RIFAMPIN <=0.5 SENSITIVE Sensitive     Inducible Clindamycin NEGATIVE Sensitive     * 30,000 COLONIES/mL STAPHYLOCOCCUS SIMULANS      Radiology Studies: Ct Chest Wo Contrast  Result Date: 12/16/2018 CLINICAL DATA:  Chronic dyspnea. EXAM: CT CHEST WITHOUT CONTRAST TECHNIQUE: Multidetector CT imaging of the chest was performed following the standard protocol without IV contrast. COMPARISON:  CT scan of Sep 07, 2018. FINDINGS: Cardiovascular: Atherosclerosis of thoracic aorta is noted without aneurysm formation. Mild cardiomegaly is noted. No pericardial effusion is noted. Mediastinum/Nodes: 11 mm pre vascular lymph node is noted which is unchanged compared to prior exam. 12 mm right paratracheal lymph node is noted which is unchanged compared to prior exam. 25 mm subcarinal lymph node is noted which is enlarged compared to prior exam. Thyroid gland and esophagus are unremarkable. Lungs/Pleura: No pneumothorax is noted. Minimal bilateral pleural effusions are noted with adjacent subsegmental atelectasis. Interval development of multifocal ill-defined airspace opacities are noted throughout both lungs peripherally consistent with multifocal pneumonia, most likely due to viral etiology. Upper Abdomen: No acute abnormality. Musculoskeletal: No chest wall mass or suspicious bone lesions identified. IMPRESSION: Interval development of multiple ill-defined airspace opacities throughout both lungs peripherally most consistent with multifocal pneumonia, most likely due to viral etiology. Minimal bilateral pleural effusions are noted with adjacent subsegmental atelectasis. Stable prevascular and right paratracheal adenopathy is noted, but subcarinal adenopathy is mildly enlarged compared to prior exam, and may be inflammatory in etiology. Aortic Atherosclerosis (ICD10-I70.0). Electronically Signed   By: Marijo Conception M.D.   On:  12/16/2018 14:21    Feldt Climes MD [facility]  Contact via  [email or web address]  [place] P: [phone] F: [phone]

## 2018-12-18 NOTE — Progress Notes (Signed)
Occupational Therapy Treatment Patient Details Name: Jamie Phillips MRN: [id number] DOB: [birth year] Today's Date: 12/18/2018    History of present illness 78 y.o. 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 Patient's daughter was just discharged from [facility] for [place]. Pt tested +COVID 11/30/18. +dehydration and hypokalemia   OT comments  Improved performance as compared to session yesterday. Able to ambulate to bathroom and complete ADL with min A with SpO2 remaining mid 90s on RA. 2/4 dyspnea but improved form yesterday. Pt will most lkely benefit form use of AE to assist with ADL due to poor endurance and body habitus. Will continue to follow acutely. Recommend HHOT.    Follow Up Recommendations  Home health OT;Supervision - Intermittent    Equipment Recommendations  None recommended by OT    Recommendations for Other Services      Precautions / Restrictions Precautions Precautions: Fall       Mobility Bed Mobility Overal bed mobility: Modified Independent                Transfers Overall transfer level: Needs assistance Equipment used: Rolling walker (2 wheeled) Transfers: Stand Pivot Transfers;Sit to/from Stand Sit to Stand: Supervision Stand pivot transfers: Supervision            Balance     Sitting balance-Leahy Scale: Good       Standing balance-Leahy Scale: Fair                             ADL either performed or assessed with clinical judgement   ADL Overall ADL's : Needs assistance/impaired     Grooming: Set up;Standing;Supervision/safety Grooming Details (indicate cue type and reason): completed at sink level; leaning on sink Upper Body Bathing: Set up;Sitting   Lower Body Bathing: Minimal assistance;Sit to/from stand           Toilet Transfer: Min guard;RW;Ambulation;Comfort height toilet;Grab bars   Toileting- Marine scientist and Hygiene: Minimal assistance;Sit to/from stand Toileting - Clothing Manipulation Details (indicate cue type and reason): Mat benefit from toilet tongs     Functional mobility during ADLs: Min guard;Rolling walker General ADL Comments: improved as compared to yesterday; May benefit form toilet ongs. Has AE; would benefit form educating pt on need to use AE     Vision       Perception     Praxis      Cognition Arousal/Alertness: Awake/alert Behavior During Therapy: WFL for tasks assessed/performed Overall Cognitive Status: Within Functional Limits for tasks assessed                                 General Comments: decreased STM at baseline        Exercises Other Exercises Other Exercises: flutter valve x 10   Shoulder Instructions       General Comments given additional word search puzzles; pt states it helps her take her mind off things; when asked about episode with anxiety last night with nursing and transferring to [facility], pt stated she did not remember    Pertinent Vitals/ Pain       Pain Assessment: No/denies pain  Home Living  Prior Functioning/Environment              Frequency  Min 3X/week        Progress Toward Goals  OT Goals(current goals can now be found in the care plan section)  Progress towards OT goals: Progressing toward goals  Acute Rehab OT Goals Patient Stated Goal: regain her strength to be able to keep up with grandchild OT Goal Formulation: With patient Time For Goal Achievement: 12/29/18 Potential to Achieve Goals: Good ADL Goals Pt Will Perform Lower Body Bathing: with modified independence;sit to/from stand;with adaptive equipment Pt Will Perform Lower Body Dressing: sit to/from stand;with modified independence;with adaptive equipment Pt Will Transfer to Toilet: with modified independence;ambulating Additional ADL Goal #1: Pt will  independently verbalize 3 energy conservation strategies Additional ADL Goal #2: Pt will independently verbalzie 3 strategies to reduce risk of falls  Plan Discharge plan remains appropriate    Co-evaluation                 AM-PAC OT "6 Clicks" Daily Activity     Outcome Measure   Help from another person eating meals?: None Help from another person taking care of personal grooming?: A Little Help from another person toileting, which includes using toliet, bedpan, or urinal?: A Little Help from another person bathing (including washing, rinsing, drying)?: A Little Help from another person to put on and taking off regular upper body clothing?: A Little Help from another person to put on and taking off regular lower body clothing?: A Little 6 Click Score: 19    End of Session Equipment Utilized During Treatment: Rolling walker  OT Visit Diagnosis: Unsteadiness on feet (R26.81);Muscle weakness (generalized) (M62.81)   Activity Tolerance Patient tolerated treatment well   Patient Left in chair;with call bell/phone within reach;with chair alarm set   Nurse Communication Mobility status        Time: 1010-1042 OT Time Calculation (min): 32 min  Charges: OT General Charges $OT Visit: 1 Visit OT Treatments $Self Care/Home Management : 23-37 mins  Maurie Boettcher, OT/L   Acute OT Clinical Specialist [place] Pager [phone] Office [phone]    [facility] 12/18/2018, 2:02 PM

## 2018-12-18 NOTE — Progress Notes (Signed)
Pt given ativan 1mg  PO around 22:30, she is now resting quietly and reports feeling much better

## 2018-12-19 LAB — LACTATE DEHYDROGENASE: LDH: 243 U/L — ABNORMAL HIGH (ref 98–192)

## 2018-12-19 MED ORDER — LORAZEPAM 0.5 MG PO TABS
1.0000 mg | ORAL_TABLET | Freq: Two times a day (BID) | ORAL | Status: DC | PRN
  Administered 2018-12-19: 1 mg via ORAL
  Filled 2018-12-19: qty 2

## 2018-12-19 MED ORDER — METHYLPREDNISOLONE SODIUM SUCC 40 MG IJ SOLR
40.0000 mg | Freq: Every day | INTRAMUSCULAR | Status: DC
  Administered 2018-12-20: 40 mg via INTRAVENOUS
  Filled 2018-12-19: qty 1

## 2018-12-19 MED ORDER — LOSARTAN POTASSIUM 25 MG PO TABS
100.0000 mg | ORAL_TABLET | Freq: Every day | ORAL | Status: DC
  Administered 2018-12-19 – 2018-12-20 (×2): 100 mg via ORAL
  Filled 2018-12-19 (×2): qty 4

## 2018-12-19 NOTE — Progress Notes (Signed)
PROGRESS NOTE  Jamie Phillips [id number] DOB: [birth year] DOA: 12/14/2018 PCP: Kirk Ruths, MD   LOS: 5 days   Brief Narrative / Interim history:  78 yo F with history of PE on chronic anticoagulation with Eliquis, CLL, MGUS, HTN, HLD, obesity who was admitted to the hospital on 12/14/2018 with weakness, nausea, abdominal discomfort and diarrhea. Her daughter was recently admitted and discharged from [facility] for Covid. Her GI symptoms have been present for the past week prior to admission.   Subjective: -Reports she is feeling better today, no nausea, no vomiting, no fever, no chest pain, dyspnea significantly improved  Assessment & Plan: Principal Problem:   COVID-19 Active Problems:   Essential hypertension   Chronic pulmonary embolism (HCC)    Acute hypoxic respiratory failure due to COVID-19 for pneumonia -He did require 2 L nasal cannula, this morning she is on room air -Continue with IV Solu-Medrol, will change to 40 mg IV Solu-Medrol once daily -She is hypoxic, CT chest with evidence of bilateral opacity, Remdesivir started 8/26 -Continue to trend inflammatory markers.   COVID-19 Labs  Recent Labs    12/17/18 0110 12/18/18 0101 12/19/18 0140  DDIMER 1.20* 1.45*  --   FERRITIN 94 86  --   LDH 308* 237* 243*  CRP 8.3* 4.4*  --    History of saddle PE -Continue Eliquis  Dehydration hypokalemia -Potassium repleted, as well received IV fluids  Hypertension -Propranolol on hold given bradycardia, blood pressure remains elevated despite starting amlodipine 10 mg oral yesterday, will add losartan .  Sinus bradycardia  -Usually when she is sleeping, asymptomatic, improved.  UTI -Continue ceftriaxone for 3 days  Scheduled Meds: . albuterol  2 puff Inhalation Q6H  . amLODipine  10 mg Oral Daily  . apixaban  5 mg Oral BID  . escitalopram  10 mg Oral Daily  . gabapentin  300 mg Oral BID  . methylPREDNISolone (SOLU-MEDROL) injection  40 mg Intravenous  Q8H  . metoprolol tartrate  25 mg Oral Once  . nystatin   Topical TID  . pantoprazole  40 mg Oral Daily  . pravastatin  40 mg Oral Daily  . sucralfate  1 g Oral TID WC & HS   Continuous Infusions: . remdesivir 100 mg in NS 250 mL 100 mg (12/18/18 1706)   PRN Meds:.acetaminophen, chlorpheniramine-HYDROcodone, guaiFENesin-dextromethorphan, hydrALAZINE, lip balm, [DISCONTINUED] ondansetron **OR** ondansetron (ZOFRAN) IV, tiZANidine  DVT prophylaxis: Eliquis Code Status: DNR Family Communication: d/w patient,  Disposition Plan: home when ready   Consultants:   None   Procedures:   None   Antimicrobials:  None    Objective: Vitals:   12/18/18 1438 12/18/18 2015 12/19/18 0424 12/19/18 0734  BP: (!) 166/90 (!) 148/74 (!) 174/78 (!) 166/94  Pulse:  86 89 79  Resp:  (!) 22 (!) 26 (!) 21  Temp:  97.9 F (36.6 C) 98.2 F (36.8 C) (!) 97.2 F (36.2 C)  TempSrc:  Oral Oral Oral  SpO2:  95% 91% 94%  Weight:      Height:        Intake/Output Summary (Last 24 hours) at 12/19/2018 1503 Last data filed at 12/19/2018 1437 Gross per 24 hour  Intake 1680 ml  Output 750 ml  Net 930 ml   Filed Weights   12/14/18 0340  Weight: 116.4 kg    Examination:  Awake Alert, Oriented X 3, No new F.N deficits, Normal affect Symmetrical Chest wall movement, Good air movement bilaterally, CTAB RRR,No Gallops,Rubs or new  Murmurs, No Parasternal Heave +ve B.Sounds, Abd Soft, No tenderness, No rebound - guarding or rigidity. No Cyanosis, Clubbing or edema, No new Rash or bruise        Data Reviewed: I have independently reviewed following labs and imaging studies   CBC: Recent Labs  Lab 12/13/18 1646 12/15/18 0145 12/16/18 0125 12/17/18 0110 12/18/18 0101  WBC 9.9 10.9* 9.7 12.9* 13.7*  NEUTROABS 5.3 7.0 6.1 7.0 8.3*  HGB 10.8* 10.0* 9.8* 10.8* 10.9*  HCT 34.6* 32.5* 32.3* 34.7* 35.2*  MCV 92.0 95.3 94.4 93.5 92.4  PLT 267 259 279 330 [id number]   Basic Metabolic Panel: Recent  Labs  Lab 12/13/18 1646 12/14/18 0750 12/15/18 0145 12/16/18 0125 12/17/18 0110 12/18/18 0101  NA 139  --  140 142 141 143  K 3.1*  --  4.3 4.2 4.9 4.5  CL 98  --  104 106 105 108  CO2 28  --  26 27 24 26   GLUCOSE 123*  --  125* 114* 122* 163*  BUN 12  --  19 21 26* 33*  CREATININE 0.96  --  0.84 0.95 0.79 1.00  CALCIUM 8.2*  --  8.3* 8.4* 8.8* 8.9  MG  --  1.8 1.8 1.9 2.0 1.9   GFR: Estimated Creatinine Clearance: 60.1 mL/min (by C-G formula based on SCr of 1 mg/dL). Liver Function Tests: Recent Labs  Lab 12/13/18 1646 12/15/18 0145 12/16/18 0125 12/17/18 0110 12/18/18 0101  AST 14* 14* 13* 21 17  ALT 8 8 10 12 13   ALKPHOS 87 79 75 80 76  BILITOT 0.4 0.2* 0.3 0.2* 0.3  PROT 6.3* 6.1* 6.3* 6.5 6.1*  ALBUMIN 2.8* 2.8* 2.7* 2.8* 2.6*   No results for input(s): LIPASE, AMYLASE in the last 168 hours. No results for input(s): AMMONIA in the last 168 hours. Coagulation Profile: No results for input(s): INR, PROTIME in the last 168 hours. Cardiac Enzymes: No results for input(s): CKTOTAL, CKMB, CKMBINDEX, TROPONINI in the last 168 hours. BNP (last 3 results) No results for input(s): PROBNP in the last 8760 hours. HbA1C: No results for input(s): HGBA1C in the last 72 hours. CBG: No results for input(s): GLUCAP in the last 168 hours. Lipid Profile: No results for input(s): CHOL, HDL, LDLCALC, TRIG, CHOLHDL, LDLDIRECT in the last 72 hours. Thyroid Function Tests: No results for input(s): TSH, T4TOTAL, FREET4, T3FREE, THYROIDAB in the last 72 hours. Anemia Panel: Recent Labs    12/17/18 0110 12/18/18 0101  FERRITIN 94 86   Urine analysis:    Component Value Date/Time   COLORURINE YELLOW (A) 12/13/2018 1648   APPEARANCEUR CLOUDY (A) 12/13/2018 1648   APPEARANCEUR Hazy 04/29/2012 1921   LABSPEC 1.025 12/13/2018 1648   LABSPEC 1.016 04/29/2012 1921   PHURINE 5.0 12/13/2018 1648   GLUCOSEU NEGATIVE 12/13/2018 1648   GLUCOSEU Negative 04/29/2012 1921   HGBUR  NEGATIVE 12/13/2018 1648   BILIRUBINUR NEGATIVE 12/13/2018 1648   BILIRUBINUR Negative 04/29/2012 1921   KETONESUR NEGATIVE 12/13/2018 1648   PROTEINUR 30 (A) 12/13/2018 1648   NITRITE NEGATIVE 12/13/2018 1648   LEUKOCYTESUR NEGATIVE 12/13/2018 1648   LEUKOCYTESUR Trace 04/29/2012 1921   Sepsis Labs: Invalid input(s): PROCALCITONIN, LACTICIDVEN  Recent Results (from the past 240 hour(s))  Urine culture     Status: Abnormal   Collection Time: 12/13/18  4:46 PM   Specimen: Urine, Random  Result Value Ref Range Status   Specimen Description   Final    URINE, RANDOM Performed at [facility], [address] [place], [place] [address]    Special Requests   Final    NONE Performed at [facility], [address] [place], [place] [address]    Culture (A)  Final    >=100,000 COLONIES/mL ESCHERICHIA COLI 30,000 COLONIES/mL STAPHYLOCOCCUS SIMULANS    Report Status 12/16/2018 FINAL  Final   Organism ID, Bacteria ESCHERICHIA COLI (A)  Final   Organism ID, Bacteria STAPHYLOCOCCUS SIMULANS (A)  Final      Susceptibility   Escherichia coli - MIC*    AMPICILLIN >=32 RESISTANT Resistant     CEFAZOLIN <=4 SENSITIVE Sensitive     CEFTRIAXONE <=1 SENSITIVE Sensitive     CIPROFLOXACIN <=0.25 SENSITIVE Sensitive     GENTAMICIN <=1 SENSITIVE Sensitive     IMIPENEM <=0.25 SENSITIVE Sensitive     NITROFURANTOIN <=16 SENSITIVE Sensitive     TRIMETH/SULFA <=20 SENSITIVE Sensitive     AMPICILLIN/SULBACTAM 16 INTERMEDIATE Intermediate     PIP/TAZO <=4 SENSITIVE Sensitive     Extended ESBL NEGATIVE Sensitive     * >=100,000 COLONIES/mL ESCHERICHIA COLI   Staphylococcus simulans - MIC*    CIPROFLOXACIN <=0.5 SENSITIVE Sensitive     GENTAMICIN <=0.5 SENSITIVE Sensitive     NITROFURANTOIN <=16 SENSITIVE Sensitive     OXACILLIN <=0.25 SENSITIVE Sensitive     TETRACYCLINE <=1 SENSITIVE Sensitive     VANCOMYCIN <=0.5 SENSITIVE Sensitive     TRIMETH/SULFA <=10 SENSITIVE  Sensitive     CLINDAMYCIN <=0.25 SENSITIVE Sensitive     RIFAMPIN <=0.5 SENSITIVE Sensitive     Inducible Clindamycin NEGATIVE Sensitive     * 30,000 COLONIES/mL STAPHYLOCOCCUS SIMULANS      Radiology Studies: No results found.  Abel Climes MD [facility]  Contact via  [email or web address]  [place] P: [phone] F: [phone]

## 2018-12-20 LAB — CBC
HCT: 35.2 % — ABNORMAL LOW (ref 36.0–46.0)
Hemoglobin: 10.9 g/dL — ABNORMAL LOW (ref 12.0–15.0)
MCH: 28.4 pg (ref 26.0–34.0)
MCHC: 31 g/dL (ref 30.0–36.0)
MCV: 91.7 fL (ref 80.0–100.0)
Platelets: 427 10*3/uL — ABNORMAL HIGH (ref 150–400)
RBC: 3.84 MIL/uL — ABNORMAL LOW (ref 3.87–5.11)
RDW: 14.9 % (ref 11.5–15.5)
WBC: 15.4 10*3/uL — ABNORMAL HIGH (ref 4.0–10.5)
nRBC: 0.1 % (ref 0.0–0.2)

## 2018-12-20 LAB — COMPREHENSIVE METABOLIC PANEL
ALT: 28 U/L (ref 0–44)
AST: 25 U/L (ref 15–41)
Albumin: 2.7 g/dL — ABNORMAL LOW (ref 3.5–5.0)
Alkaline Phosphatase: 80 U/L (ref 38–126)
Anion gap: 13 (ref 5–15)
BUN: 40 mg/dL — ABNORMAL HIGH (ref 8–23)
CO2: 24 mmol/L (ref 22–32)
Calcium: 8.9 mg/dL (ref 8.9–10.3)
Chloride: 105 mmol/L (ref 98–111)
Creatinine, Ser: 1.04 mg/dL — ABNORMAL HIGH (ref 0.44–1.00)
GFR calc Af Amer: >60 mL/min (ref >60)
GFR calc non Af Amer: 52 mL/min — ABNORMAL LOW (ref >60)
Glucose, Bld: 159 mg/dL — ABNORMAL HIGH (ref 70–99)
Potassium: 4.4 mmol/L (ref 3.5–5.1)
Sodium: 142 mmol/L (ref 135–145)
Total Bilirubin: 0.3 mg/dL (ref 0.3–1.2)
Total Protein: 6 g/dL — ABNORMAL LOW (ref 6.5–8.1)

## 2018-12-20 LAB — C-REACTIVE PROTEIN: CRP: 1.4 mg/dL — ABNORMAL HIGH (ref <1.0)

## 2018-12-20 MED ORDER — DEXAMETHASONE 6 MG PO TABS: 6.0000 mg | ORAL_TABLET | Freq: Two times a day (BID) | ORAL | 0 refills | Status: DC

## 2018-12-20 NOTE — Discharge Summary (Signed)
Jamie Phillips, is a 78 y.o. female  DOB 1940/10/29  MRN [id number].  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[place])      Past Medical History:  Diagnosis Date   Anginal pain ([place])    atypical chest pain   Anxiety    Arthritis    Back pain    Cancer (HCC)    cll   Chronic kidney disease    CLL (chronic lymphocytic leukemia) (HCC)    Depression    GERD (gastroesophageal reflux disease)    Headache    Hemorrhoids    Hypercholesteremia    Hyperlipidemia    Hypertension    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)    Macular degeneration    MGUS (monoclonal gammopathy of unknown significance)    Obesity    Pulmonary embolism (HCC)    PVD (peripheral vascular disease) ([place])    Sleep apnea     Past Surgical History:  Procedure Laterality Date   BREAST BIOPSY Left 08/19/04   lt bx/clip-neg   COLONOSCOPY     COLONOSCOPY WITH PROPOFOL N/A 03/10/2017   Procedure: COLONOSCOPY WITH PROPOFOL;  Surgeon: [name], MD;  Location: [facility] ENDOSCOPY;  Service: Endoscopy;  Laterality: N/A;   IVC FILTER INSERTION N/A 06/11/2016   Procedure: IVC Filter Insertion;  Surgeon: Katha Cabal, MD;  Location: [place] CV LAB;  Service: Cardiovascular;  Laterality: N/A;   IVC FILTER REMOVAL N/A 05/06/2017   Procedure: IVC FILTER REMOVAL;  Surgeon: Katha Cabal, MD;  Location: [place] CV LAB;  Service: Cardiovascular;  Laterality: N/A;   JOINT  REPLACEMENT Left 06/17/2016   LAPAROSCOPIC APPENDECTOMY N/A 10/10/2018   Procedure: APPENDECTOMY LAPAROSCOPIC ATTEMPTED CONVERTED TO OPEN;  Surgeon: Herbert Pun, MD;  Location: [facility] ORS;  Service: General;  Laterality: N/A;   LAPAROSCOPY N/A 10/10/2018   Procedure: LAPAROSCOPY DIAGNOSTIC ATTEMPTED CONVERTED TO OPEN;  Surgeon: Herbert Pun, MD;  Location: [facility] ORS;  Service: General;  Laterality: N/A;   neck fusion     PARTIAL COLECTOMY Right 10/10/2018   Procedure: PARTIAL COLECTOMY;  Surgeon: Herbert Pun, MD;  Location: [facility] ORS;  Service: General;  Laterality: Right;   PERIPHERAL VASCULAR CATHETERIZATION N/A 08/15/2015   Procedure: pulmonary angiogram with lysis;  Surgeon: Katha Cabal, MD;  Location: [place] CV LAB;  Service: Cardiovascular;  Laterality: N/A;   WRIST SURGERY Left 2000       History of present illness and  Hospital Course:     Kindly see H&P for history of present illness and admission details, please review complete Labs, Consult reports and Test  reports for all details in brief  HPI  from the history and physical done on the day of admission 12/14/2018   HPI: Jamie Phillips is a 78 y.o. 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  Patient's daughter was just discharged from [facility] for [place], patient had been with daughter.  When daughter was found to have COVID, patient got tested (13 days ago on 8/10) and her COVID came back positive.  She was initially asymptomatic, but over the past 1 week has developed chills, N/V/D, generalized weakness, loss of appetitie.  Mild SOB, no cough.  Unable to keep POs down for past 2 days.  Increasing SOB today.  No dysuria, no leg swelling, no rash.   ED Course: Satting ~90% on RA, improved to mid 90s on 2L via [place].  CXR neg.  Given rocephin 1g and IVF in ED.  Transferred to [place].  Hospital Course     Acute hypoxic  respiratory failure due to COVID-19 for pneumonia -He did require 2 L nasal cannula, he has been on room air for last 48 hours, treated with IV Solu-Medrol during hospital stay, she will be discharged on another 5 days of Decadron to finish 10 days of steroid treatment,. -She is hypoxic, CT chest with evidence of bilateral opacity, treated with total of 5 days of Remdesivir -Patient will be discharged home today, she is on room air, she was encouraged to keep using her incentive spirometry and flutter valve    COVID-19 Labs  Recent Labs (last 2 labs)        Recent Labs    12/17/18 0110 12/18/18 0101 12/19/18 0140  DDIMER 1.20* 1.45*  --   FERRITIN 94 86  --   LDH 308* 237* 243*  CRP 8.3* 4.4*  --      History of saddle PE -Continue Eliquis  Dehydration hypokalemia -Potassium repleted,   Hypertension -Resume home medication on discharge .  Sinus bradycardia  -She was monitored on telemetry, this has resolved  UTI -Continue ceftriaxone for 3 days    Discharge Condition: stable   Follow UP  Follow-up Information    [facility] [facility].   Specialty: [place] Why: A repesentative from [facility] will contact you to arrange start date and time for your therapy. Contact information: [address] [place] [address] [phone]             Discharge Instructions  and  Discharge Medications     Discharge Instructions    Discharge instructions   Complete by: As directed    Follow with Primary MD Kirk Ruths, MD in 7 days   Get CBC, CMP, checked  by Primary MD next visit.    Activity: As tolerated with Full fall precautions use walker/cane & assistance as needed   Disposition Home    Diet: Heart Healthy  , with feeding assistance and aspiration precautions.  For Heart failure patients - Check your Weight same time everyday, if you gain over 2 pounds, or you develop in leg swelling,  experience more shortness of breath or chest pain, call your Primary MD immediately. Follow Cardiac Low Salt Diet and 1.5 lit/day fluid restriction.   On your next visit with your primary care physician please Get Medicines reviewed and adjusted.   Please request your Prim.MD to go over all Hospital Tests and Procedure/Radiological results at the follow up, please get  all Hospital records sent to your Prim MD by signing hospital release before you go home.   If you experience worsening of your admission symptoms, develop shortness of breath, life threatening emergency, suicidal or homicidal thoughts you must seek medical attention immediately by calling 911 or calling your MD immediately  if symptoms less severe.  You Must read complete instructions/literature along with all the possible adverse reactions/side effects for all the Medicines you take and that have been prescribed to you. Take any new Medicines after you have completely understood and accpet all the possible adverse reactions/side effects.   Do not drive, operating heavy machinery, perform activities at heights, swimming or participation in water activities or provide baby sitting services if your were admitted for syncope or siezures until you have seen by Primary MD or a Neurologist and advised to do so again.  Do not drive when taking Pain medications.    Do not take more than prescribed Pain, Sleep and Anxiety Medications  Special Instructions: If you have smoked or chewed Tobacco  in the last 2 yrs please stop smoking, stop any regular Alcohol  and or any Recreational drug use.  Wear Seat belts while driving.   Please note  You were cared for by a hospitalist during your hospital stay. If you have any questions about your discharge medications or the care you received while you were in the hospital after you are discharged, you can call the unit and asked to speak with the hospitalist on call if the hospitalist that took  care of you is not available. Once you are discharged, your primary care physician will handle any further medical issues. Please note that NO REFILLS for any discharge medications will be authorized once you are discharged, as it is imperative that you return to your primary care physician (or establish a relationship with a primary care physician if you do not have one) for your aftercare needs so that they can reassess your need for medications and monitor your lab values.   Increase activity slowly   Complete by: As directed      Allergies as of 12/20/2018      Reactions   Oxycodone Other (See Comments)   Reaction:  Hallucinations   Morphine Other (See Comments)   Reaction:  Hallucinations      Medication List    STOP taking these medications   amoxicillin 500 MG capsule Commonly known as: AMOXIL   sucralfate 1 GM/10ML suspension Commonly known as: CARAFATE     TAKE these medications   acetaminophen 650 MG CR tablet Commonly known as: TYLENOL Take 650-1,300 mg by mouth every 8 (eight) hours as needed for pain. TYLENOL ARTHRITIS   albuterol 108 (90 Base) MCG/ACT inhaler Commonly known as: VENTOLIN HFA Inhale 2 puffs into the lungs every 6 (six) hours as needed for wheezing or shortness of breath.   dexamethasone 6 MG tablet Commonly known as: DECADRON Take 1 tablet (6 mg total) by mouth 2 (two) times daily with a meal. For 5 days Start taking on: December 21, 2018   Eliquis 5 MG Tabs tablet Generic drug: apixaban TAKE 1 TABLET TWICE DAILY What changed: how much to take   escitalopram 10 MG tablet Commonly known as: LEXAPRO Take 10 mg by mouth daily.   gabapentin 300 MG capsule Commonly known as: NEURONTIN Take 300 mg by mouth 2 (two) times daily.   loperamide 2 MG capsule Commonly known as: IMODIUM Take 2 mg by mouth as needed for  diarrhea or loose stools.   losartan-hydrochlorothiazide 100-12.5 MG tablet Commonly known as: HYZAAR Take 1 tablet by mouth daily.  Reported on 08/22/2015   pantoprazole 40 MG tablet Commonly known as: PROTONIX Take 1 tablet (40 mg total) by mouth 2 (two) times daily for 14 days. What changed: when to take this   pravastatin 40 MG tablet Commonly known as: PRAVACHOL Take 40 mg by mouth daily.   PRESERVISION AREDS 2 PO Take 1 capsule by mouth 2 (two) times daily.   propranolol 40 MG tablet Commonly known as: INDERAL Take 40 mg by mouth 2 (two) times a day.   tiZANidine 4 MG tablet Commonly known as: ZANAFLEX Take 4 mg by mouth at bedtime as needed for muscle spasms.         Diet and Activity recommendation: See Discharge Instructions above   Consults obtained - none   Major procedures and Radiology Reports - PLEASE review detailed and final reports for all details, in brief -    Ct Chest Wo Contrast  Result Date: 12/16/2018 CLINICAL DATA:  Chronic dyspnea. EXAM: CT CHEST WITHOUT CONTRAST TECHNIQUE: Multidetector CT imaging of the chest was performed following the standard protocol without IV contrast. COMPARISON:  CT scan of Sep 07, 2018. FINDINGS: Cardiovascular: Atherosclerosis of thoracic aorta is noted without aneurysm formation. Mild cardiomegaly is noted. No pericardial effusion is noted. Mediastinum/Nodes: 11 mm pre vascular lymph node is noted which is unchanged compared to prior exam. 12 mm right paratracheal lymph node is noted which is unchanged compared to prior exam. 25 mm subcarinal lymph node is noted which is enlarged compared to prior exam. Thyroid gland and esophagus are unremarkable. Lungs/Pleura: No pneumothorax is noted. Minimal bilateral pleural effusions are noted with adjacent subsegmental atelectasis. Interval development of multifocal ill-defined airspace opacities are noted throughout both lungs peripherally consistent with multifocal pneumonia, most likely due to viral etiology. Upper Abdomen: No acute abnormality. Musculoskeletal: No chest wall mass or suspicious bone lesions  identified. IMPRESSION: Interval development of multiple ill-defined airspace opacities throughout both lungs peripherally most consistent with multifocal pneumonia, most likely due to viral etiology. Minimal bilateral pleural effusions are noted with adjacent subsegmental atelectasis. Stable prevascular and right paratracheal adenopathy is noted, but subcarinal adenopathy is mildly enlarged compared to prior exam, and may be inflammatory in etiology. Aortic Atherosclerosis (ICD10-I70.0). Electronically Signed   By: Marijo Conception M.D.   On: 12/16/2018 14:21   Dg Chest Port 1 View  Result Date: 12/16/2018 CLINICAL DATA:  Hypoxia EXAM: PORTABLE CHEST 1 VIEW COMPARISON:  12/13/2018 FINDINGS: The heart size and mediastinal contours are stable. Similar degree of prominent interstitial markings throughout both lungs. No new focal airspace consolidation. No pleural effusion or pneumothorax. IMPRESSION: Mild bronchitic changes throughout both lungs, not significantly changed from prior. Electronically Signed   By: Davina Poke M.D.   On: 12/16/2018 10:44   Dg Chest Portable 1 View  Result Date: 12/13/2018 CLINICAL DATA:  Shortness of breath, weakness EXAM: PORTABLE CHEST 1 VIEW COMPARISON:  10/09/2018 FINDINGS: Heart is upper limits normal in size. Mild peribronchial thickening and interstitial prominence is similar to prior study. No effusions or acute bony abnormality. IMPRESSION: Mild bronchitic changes. Electronically Signed   By: Rolm Baptise M.D.   On: 12/13/2018 17:14    Micro Results     Recent Results (from the past 240 hour(s))  Urine culture     Status: Abnormal   Collection Time: 12/13/18  4:46 PM   Specimen: Urine, Random  Result Value Ref Range Status   Specimen Description   Final    URINE, RANDOM Performed at [facility], [address] [place], [place] [address]    Special Requests   Final    NONE Performed at [facility], [address]  [place], [place] [address]    Culture (A)  Final    >=100,000 COLONIES/mL ESCHERICHIA COLI 30,000 COLONIES/mL STAPHYLOCOCCUS SIMULANS    Report Status 12/16/2018 FINAL  Final   Organism ID, Bacteria ESCHERICHIA COLI (A)  Final   Organism ID, Bacteria STAPHYLOCOCCUS SIMULANS (A)  Final      Susceptibility   Escherichia coli - MIC*    AMPICILLIN >=32 RESISTANT Resistant     CEFAZOLIN <=4 SENSITIVE Sensitive     CEFTRIAXONE <=1 SENSITIVE Sensitive     CIPROFLOXACIN <=0.25 SENSITIVE Sensitive     GENTAMICIN <=1 SENSITIVE Sensitive     IMIPENEM <=0.25 SENSITIVE Sensitive     NITROFURANTOIN <=16 SENSITIVE Sensitive     TRIMETH/SULFA <=20 SENSITIVE Sensitive     AMPICILLIN/SULBACTAM 16 INTERMEDIATE Intermediate     PIP/TAZO <=4 SENSITIVE Sensitive     Extended ESBL NEGATIVE Sensitive     * >=100,000 COLONIES/mL ESCHERICHIA COLI   Staphylococcus simulans - MIC*    CIPROFLOXACIN <=0.5 SENSITIVE Sensitive     GENTAMICIN <=0.5 SENSITIVE Sensitive     NITROFURANTOIN <=16 SENSITIVE Sensitive     OXACILLIN <=0.25 SENSITIVE Sensitive     TETRACYCLINE <=1 SENSITIVE Sensitive     VANCOMYCIN <=0.5 SENSITIVE Sensitive     TRIMETH/SULFA <=10 SENSITIVE Sensitive     CLINDAMYCIN <=0.25 SENSITIVE Sensitive     RIFAMPIN <=0.5 SENSITIVE Sensitive     Inducible Clindamycin NEGATIVE Sensitive     * 30,000 COLONIES/mL STAPHYLOCOCCUS SIMULANS       Today   Subjective:   Jamie Phillips today has no headache,no chest abdominal pain,no new weakness tingling or numbness, feels much better wants to go home today.   Objective:   Blood pressure (!) 144/86, pulse 73, temperature 98.1 F (36.7 C), temperature source Oral, resp. rate (!) 22, height 5\' 5"  (1.651 m), weight 116.4 kg, SpO2 94 %.   Intake/Output Summary (Last 24 hours) at 12/20/2018 1440 Last data filed at 12/20/2018 1330 Gross per 24 hour  Intake 1330 ml  Output 700 ml  Net 630 ml    Exam Awake Alert, Oriented x 3, No new F.N deficits,  Normal affect Symmetrical Chest wall movement, Good air movement bilaterally, CTAB RRR,No Gallops,Rubs or new Murmurs, No Parasternal Heave +ve B.Sounds, Abd Soft, Non tender, No organomegaly appriciated, No rebound -guarding or rigidity. No Cyanosis, Clubbing or edema, No new Rash or bruise  Data Review   CBC w Diff:  Lab Results  Component Value Date   WBC 15.4 (H) 12/20/2018   HGB 10.9 (L) 12/20/2018   HGB 13.3 07/28/2014   HCT 35.2 (L) 12/20/2018   HCT 41.5 07/28/2014   PLT 427 (H) 12/20/2018   PLT 219 07/28/2014   LYMPHOPCT 31 12/18/2018   LYMPHOPCT 76.6 07/28/2014   MONOPCT 6 12/18/2018   MONOPCT 3.3 07/28/2014   EOSPCT 0 12/18/2018   EOSPCT 1.7 07/28/2014   BASOPCT 0 12/18/2018   BASOPCT 0.6 07/28/2014    CMP:  Lab Results  Component Value Date   NA 142 12/20/2018   NA 141 04/29/2012   K 4.4 12/20/2018   K 3.9 04/29/2012   CL 105 12/20/2018   CL 107 04/29/2012   CO2 24 12/20/2018  CO2 28 04/29/2012   BUN 40 (H) 12/20/2018   BUN 21 (H) 04/29/2012   CREATININE 1.04 (H) 12/20/2018   CREATININE 1.02 (H) 07/28/2014   PROT 6.0 (L) 12/20/2018   PROT 7.1 04/29/2012   ALBUMIN 2.7 (L) 12/20/2018   ALBUMIN 3.3 (L) 04/29/2012   BILITOT 0.3 12/20/2018   BILITOT 0.3 04/29/2012   ALKPHOS 80 12/20/2018   ALKPHOS 169 (H) 04/29/2012   AST 25 12/20/2018   AST 20 04/29/2012   ALT 28 12/20/2018   ALT 33 04/29/2012  .   Total Time in preparing paper work, data evaluation and todays exam - 20 minutes  Dunnavant Climes M.D on 12/20/2018 at 2:40 PM  [facility]   Office  [phone]

## 2018-12-20 NOTE — Progress Notes (Signed)
Physical Therapy Treatment Patient Details Name: Jamie Phillips MRN: [id number] DOB: 04-26-1940 Today's Date: 12/20/2018    History of Present Illness 78 y.o. 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 Patient's daughter was just discharged from [facility] for Jamie Phillips. Pt tested +COVID 11/30/18. +dehydration and hypokalemia    PT Comments     Patient ambulated x 190" with rollator, SPO2 94% on RA. Continue  Mobility while inm hospital.   Follow Up Recommendations  Home health PT;Supervision for mobility/OOB     Equipment Recommendations  None recommended by PT    Recommendations for Other Services       Precautions / Restrictions Precautions Precaution Comments: denies falls; uses walker for safety due to blood thinners     Mobility  Bed Mobility Overal bed mobility: Modified Independent                Transfers   Equipment used: 4-wheeled walker Transfers: Stand Pivot Transfers;Sit to/from Stand Sit to Stand: Supervision Stand pivot transfers: Supervision       General transfer comment: transfer from bed to [facility]  Ambulation/Gait Ambulation/Gait assistance: Supervision Gait Distance (Feet): 190 Feet Assistive device: 4-wheeled walker Gait Pattern/deviations: Step-through pattern;Decreased stride length Gait velocity: decr   General Gait Details: moves slowly, states she is sleepy from meds   Stairs             Wheelchair Mobility    Modified Rankin (Stroke Patients Only)       Balance     Sitting balance-Leahy Scale: Good       Standing balance-Leahy Scale: Fair                              Cognition Arousal/Alertness: Awake/alert Behavior During Therapy: WFL for tasks assessed/performed                                          Exercises      General Comments        Pertinent Vitals/Pain Pain  Assessment: No/denies pain    Home Living                      Prior Function            PT Goals (current goals can now be found in the care plan section) Progress towards PT goals: Progressing toward goals    Frequency    Min 3X/week      PT Plan Current plan remains appropriate    Co-evaluation              AM-PAC PT "6 Clicks" Mobility   Outcome Measure  Help needed turning from your back to your side while in a flat bed without using bedrails?: None Help needed moving from lying on your back to sitting on the side of a flat bed without using bedrails?: None Help needed moving to and from a bed to a chair (including a wheelchair)?: A Little Help needed standing up from a chair using your arms (e.g., wheelchair or bedside chair)?: A Little Help needed to walk in hospital room?: A Little Help needed climbing 3-5 steps with a railing? : A Little 6 Click Score: 20    End of Session  Activity Tolerance: Patient tolerated treatment well Patient left: in chair;with call bell/phone within reach Nurse Communication: Mobility status       Time: [id number] PT Time Calculation (min) (ACUTE ONLY): 26 min  Charges:  $Gait Training: 8-22 mins $Self Care/Home Management: [place]  Office [phone]    Claretha Cooper 12/20/2018, 1:19 PM

## 2018-12-22 DIAGNOSIS — N189 Chronic kidney disease, unspecified: Secondary | ICD-10-CM | POA: Diagnosis not present

## 2018-12-22 DIAGNOSIS — C9110 Chronic lymphocytic leukemia of B-cell type not having achieved remission: Secondary | ICD-10-CM | POA: Diagnosis not present

## 2018-12-22 DIAGNOSIS — J1289 Other viral pneumonia: Secondary | ICD-10-CM | POA: Diagnosis not present

## 2018-12-22 DIAGNOSIS — J90 Pleural effusion, not elsewhere classified: Secondary | ICD-10-CM | POA: Diagnosis not present

## 2018-12-22 DIAGNOSIS — I1310 Hypertensive heart and chronic kidney disease without heart failure, with stage 1 through stage 4 chronic kidney disease, or unspecified chronic kidney disease: Secondary | ICD-10-CM | POA: Diagnosis not present

## 2018-12-22 DIAGNOSIS — U071 COVID-19: Secondary | ICD-10-CM | POA: Diagnosis not present

## 2018-12-22 DIAGNOSIS — N390 Urinary tract infection, site not specified: Secondary | ICD-10-CM | POA: Diagnosis not present

## 2018-12-22 DIAGNOSIS — I739 Peripheral vascular disease, unspecified: Secondary | ICD-10-CM | POA: Diagnosis not present

## 2018-12-22 DIAGNOSIS — I2692 Saddle embolus of pulmonary artery without acute cor pulmonale: Secondary | ICD-10-CM | POA: Diagnosis not present

## 2018-12-23 DIAGNOSIS — U071 COVID-19: Secondary | ICD-10-CM | POA: Diagnosis not present

## 2018-12-23 DIAGNOSIS — J1289 Other viral pneumonia: Secondary | ICD-10-CM | POA: Diagnosis not present

## 2018-12-23 DIAGNOSIS — I2692 Saddle embolus of pulmonary artery without acute cor pulmonale: Secondary | ICD-10-CM | POA: Diagnosis not present

## 2018-12-23 DIAGNOSIS — N189 Chronic kidney disease, unspecified: Secondary | ICD-10-CM | POA: Diagnosis not present

## 2018-12-23 DIAGNOSIS — I1310 Hypertensive heart and chronic kidney disease without heart failure, with stage 1 through stage 4 chronic kidney disease, or unspecified chronic kidney disease: Secondary | ICD-10-CM | POA: Diagnosis not present

## 2018-12-23 DIAGNOSIS — C9110 Chronic lymphocytic leukemia of B-cell type not having achieved remission: Secondary | ICD-10-CM | POA: Diagnosis not present

## 2018-12-23 DIAGNOSIS — J90 Pleural effusion, not elsewhere classified: Secondary | ICD-10-CM | POA: Diagnosis not present

## 2018-12-23 DIAGNOSIS — I739 Peripheral vascular disease, unspecified: Secondary | ICD-10-CM | POA: Diagnosis not present

## 2018-12-23 DIAGNOSIS — N390 Urinary tract infection, site not specified: Secondary | ICD-10-CM | POA: Diagnosis not present

## 2018-12-29 DIAGNOSIS — U071 COVID-19: Secondary | ICD-10-CM | POA: Diagnosis not present

## 2018-12-29 DIAGNOSIS — I739 Peripheral vascular disease, unspecified: Secondary | ICD-10-CM | POA: Diagnosis not present

## 2018-12-29 DIAGNOSIS — C9110 Chronic lymphocytic leukemia of B-cell type not having achieved remission: Secondary | ICD-10-CM | POA: Diagnosis not present

## 2018-12-29 DIAGNOSIS — N390 Urinary tract infection, site not specified: Secondary | ICD-10-CM | POA: Diagnosis not present

## 2018-12-29 DIAGNOSIS — J1289 Other viral pneumonia: Secondary | ICD-10-CM | POA: Diagnosis not present

## 2018-12-29 DIAGNOSIS — J90 Pleural effusion, not elsewhere classified: Secondary | ICD-10-CM | POA: Diagnosis not present

## 2018-12-29 DIAGNOSIS — I2692 Saddle embolus of pulmonary artery without acute cor pulmonale: Secondary | ICD-10-CM | POA: Diagnosis not present

## 2018-12-29 DIAGNOSIS — I1310 Hypertensive heart and chronic kidney disease without heart failure, with stage 1 through stage 4 chronic kidney disease, or unspecified chronic kidney disease: Secondary | ICD-10-CM | POA: Diagnosis not present

## 2018-12-29 DIAGNOSIS — N189 Chronic kidney disease, unspecified: Secondary | ICD-10-CM | POA: Diagnosis not present

## 2018-12-31 DIAGNOSIS — U071 COVID-19: Secondary | ICD-10-CM | POA: Diagnosis not present

## 2018-12-31 DIAGNOSIS — I1310 Hypertensive heart and chronic kidney disease without heart failure, with stage 1 through stage 4 chronic kidney disease, or unspecified chronic kidney disease: Secondary | ICD-10-CM | POA: Diagnosis not present

## 2018-12-31 DIAGNOSIS — C9110 Chronic lymphocytic leukemia of B-cell type not having achieved remission: Secondary | ICD-10-CM | POA: Diagnosis not present

## 2018-12-31 DIAGNOSIS — J90 Pleural effusion, not elsewhere classified: Secondary | ICD-10-CM | POA: Diagnosis not present

## 2018-12-31 DIAGNOSIS — J1289 Other viral pneumonia: Secondary | ICD-10-CM | POA: Diagnosis not present

## 2018-12-31 DIAGNOSIS — I2692 Saddle embolus of pulmonary artery without acute cor pulmonale: Secondary | ICD-10-CM | POA: Diagnosis not present

## 2018-12-31 DIAGNOSIS — I739 Peripheral vascular disease, unspecified: Secondary | ICD-10-CM | POA: Diagnosis not present

## 2018-12-31 DIAGNOSIS — N189 Chronic kidney disease, unspecified: Secondary | ICD-10-CM | POA: Diagnosis not present

## 2018-12-31 DIAGNOSIS — N390 Urinary tract infection, site not specified: Secondary | ICD-10-CM | POA: Diagnosis not present

## 2019-01-02 DIAGNOSIS — U071 COVID-19: Secondary | ICD-10-CM | POA: Diagnosis not present

## 2019-01-02 DIAGNOSIS — I2692 Saddle embolus of pulmonary artery without acute cor pulmonale: Secondary | ICD-10-CM | POA: Diagnosis not present

## 2019-01-02 DIAGNOSIS — N189 Chronic kidney disease, unspecified: Secondary | ICD-10-CM | POA: Diagnosis not present

## 2019-01-02 DIAGNOSIS — I739 Peripheral vascular disease, unspecified: Secondary | ICD-10-CM | POA: Diagnosis not present

## 2019-01-02 DIAGNOSIS — N390 Urinary tract infection, site not specified: Secondary | ICD-10-CM | POA: Diagnosis not present

## 2019-01-02 DIAGNOSIS — I1310 Hypertensive heart and chronic kidney disease without heart failure, with stage 1 through stage 4 chronic kidney disease, or unspecified chronic kidney disease: Secondary | ICD-10-CM | POA: Diagnosis not present

## 2019-01-02 DIAGNOSIS — J90 Pleural effusion, not elsewhere classified: Secondary | ICD-10-CM | POA: Diagnosis not present

## 2019-01-02 DIAGNOSIS — C9110 Chronic lymphocytic leukemia of B-cell type not having achieved remission: Secondary | ICD-10-CM | POA: Diagnosis not present

## 2019-01-02 DIAGNOSIS — J1289 Other viral pneumonia: Secondary | ICD-10-CM | POA: Diagnosis not present

## 2019-01-05 DIAGNOSIS — N390 Urinary tract infection, site not specified: Secondary | ICD-10-CM | POA: Diagnosis not present

## 2019-01-05 DIAGNOSIS — J90 Pleural effusion, not elsewhere classified: Secondary | ICD-10-CM | POA: Diagnosis not present

## 2019-01-05 DIAGNOSIS — J1289 Other viral pneumonia: Secondary | ICD-10-CM | POA: Diagnosis not present

## 2019-01-05 DIAGNOSIS — I1310 Hypertensive heart and chronic kidney disease without heart failure, with stage 1 through stage 4 chronic kidney disease, or unspecified chronic kidney disease: Secondary | ICD-10-CM | POA: Diagnosis not present

## 2019-01-05 DIAGNOSIS — I739 Peripheral vascular disease, unspecified: Secondary | ICD-10-CM | POA: Diagnosis not present

## 2019-01-05 DIAGNOSIS — N189 Chronic kidney disease, unspecified: Secondary | ICD-10-CM | POA: Diagnosis not present

## 2019-01-05 DIAGNOSIS — U071 COVID-19: Secondary | ICD-10-CM | POA: Diagnosis not present

## 2019-01-05 DIAGNOSIS — I2692 Saddle embolus of pulmonary artery without acute cor pulmonale: Secondary | ICD-10-CM | POA: Diagnosis not present

## 2019-01-05 DIAGNOSIS — C9110 Chronic lymphocytic leukemia of B-cell type not having achieved remission: Secondary | ICD-10-CM | POA: Diagnosis not present

## 2019-01-12 DIAGNOSIS — N189 Chronic kidney disease, unspecified: Secondary | ICD-10-CM | POA: Diagnosis not present

## 2019-01-12 DIAGNOSIS — I1310 Hypertensive heart and chronic kidney disease without heart failure, with stage 1 through stage 4 chronic kidney disease, or unspecified chronic kidney disease: Secondary | ICD-10-CM | POA: Diagnosis not present

## 2019-01-12 DIAGNOSIS — I739 Peripheral vascular disease, unspecified: Secondary | ICD-10-CM | POA: Diagnosis not present

## 2019-01-12 DIAGNOSIS — N390 Urinary tract infection, site not specified: Secondary | ICD-10-CM | POA: Diagnosis not present

## 2019-01-12 DIAGNOSIS — J1289 Other viral pneumonia: Secondary | ICD-10-CM | POA: Diagnosis not present

## 2019-01-12 DIAGNOSIS — C9110 Chronic lymphocytic leukemia of B-cell type not having achieved remission: Secondary | ICD-10-CM | POA: Diagnosis not present

## 2019-01-12 DIAGNOSIS — U071 COVID-19: Secondary | ICD-10-CM | POA: Diagnosis not present

## 2019-01-12 DIAGNOSIS — J90 Pleural effusion, not elsewhere classified: Secondary | ICD-10-CM | POA: Diagnosis not present

## 2019-01-12 DIAGNOSIS — I2692 Saddle embolus of pulmonary artery without acute cor pulmonale: Secondary | ICD-10-CM | POA: Diagnosis not present

## 2019-01-15 ENCOUNTER — Other Ambulatory Visit
Admission: RE | Admit: 2019-01-15 | Discharge: 2019-01-15 | Disposition: A | Discharge status: Home / Self Care | Payer: Medicare HMO | Source: Ambulatory Visit | Attending: Gastroenterology | Admitting: Gastroenterology

## 2019-01-15 NOTE — Pre-Procedure Instructions (Signed)
Called Dr. Gustavo Lah office and spoke with Anderson Malta. Explained the policy regarding repeat Covid testing . If someone tests positive then we do not need to reswab for 90 days.  She was positive on 12/01/2018.She is going to confirm if this is acceptable with the physician and will call us back.

## 2019-01-18 ENCOUNTER — Other Ambulatory Visit: Payer: Self-pay | Admitting: Gastroenterology

## 2019-01-18 DIAGNOSIS — R1310 Dysphagia, unspecified: Secondary | ICD-10-CM

## 2019-01-21 DIAGNOSIS — I2692 Saddle embolus of pulmonary artery without acute cor pulmonale: Secondary | ICD-10-CM | POA: Diagnosis not present

## 2019-01-21 DIAGNOSIS — I739 Peripheral vascular disease, unspecified: Secondary | ICD-10-CM | POA: Diagnosis not present

## 2019-01-21 DIAGNOSIS — N390 Urinary tract infection, site not specified: Secondary | ICD-10-CM | POA: Diagnosis not present

## 2019-01-21 DIAGNOSIS — I1310 Hypertensive heart and chronic kidney disease without heart failure, with stage 1 through stage 4 chronic kidney disease, or unspecified chronic kidney disease: Secondary | ICD-10-CM | POA: Diagnosis not present

## 2019-01-21 DIAGNOSIS — U071 COVID-19: Secondary | ICD-10-CM | POA: Diagnosis not present

## 2019-01-21 DIAGNOSIS — J1289 Other viral pneumonia: Secondary | ICD-10-CM | POA: Diagnosis not present

## 2019-01-21 DIAGNOSIS — J90 Pleural effusion, not elsewhere classified: Secondary | ICD-10-CM | POA: Diagnosis not present

## 2019-01-21 DIAGNOSIS — C9110 Chronic lymphocytic leukemia of B-cell type not having achieved remission: Secondary | ICD-10-CM | POA: Diagnosis not present

## 2019-01-21 DIAGNOSIS — N189 Chronic kidney disease, unspecified: Secondary | ICD-10-CM | POA: Diagnosis not present

## 2019-01-22 ENCOUNTER — Ambulatory Visit
Admission: RE | Admit: 2019-01-22 | Discharge: 2019-01-22 | Disposition: A | Discharge status: Home / Self Care | Payer: Medicare HMO | Source: Ambulatory Visit | Attending: Gastroenterology | Admitting: Gastroenterology

## 2019-01-22 ENCOUNTER — Other Ambulatory Visit: Admission: RE | Admit: 2019-01-22 | Payer: Medicare HMO | Source: Ambulatory Visit

## 2019-01-22 ENCOUNTER — Other Ambulatory Visit: Payer: Self-pay

## 2019-01-22 DIAGNOSIS — R1310 Dysphagia, unspecified: Secondary | ICD-10-CM | POA: Insufficient documentation

## 2019-01-22 DIAGNOSIS — K449 Diaphragmatic hernia without obstruction or gangrene: Secondary | ICD-10-CM | POA: Diagnosis not present

## 2019-01-22 DIAGNOSIS — K224 Dyskinesia of esophagus: Secondary | ICD-10-CM | POA: Diagnosis not present

## 2019-01-26 ENCOUNTER — Encounter: Payer: Self-pay | Admitting: *Deleted

## 2019-01-29 ENCOUNTER — Other Ambulatory Visit: Admission: RE | Admit: 2019-01-29 | Payer: Medicare HMO | Source: Ambulatory Visit

## 2019-02-01 ENCOUNTER — Encounter: Payer: Self-pay | Admitting: *Deleted

## 2019-02-02 ENCOUNTER — Ambulatory Visit: Payer: Medicare HMO | Admitting: Certified Registered"

## 2019-02-02 ENCOUNTER — Encounter
Admission: RE | Disposition: A | Discharge status: Home / Self Care | Payer: Self-pay | Source: Home / Self Care | Attending: Internal Medicine

## 2019-02-02 ENCOUNTER — Ambulatory Visit
Admission: RE | Admit: 2019-02-02 | Discharge: 2019-02-02 | Disposition: A | Discharge status: Home / Self Care | Payer: Medicare HMO | Attending: Internal Medicine | Admitting: Internal Medicine

## 2019-02-02 ENCOUNTER — Encounter: Payer: Self-pay | Admitting: *Deleted

## 2019-02-02 DIAGNOSIS — I739 Peripheral vascular disease, unspecified: Secondary | ICD-10-CM | POA: Diagnosis not present

## 2019-02-02 DIAGNOSIS — K224 Dyskinesia of esophagus: Secondary | ICD-10-CM | POA: Insufficient documentation

## 2019-02-02 DIAGNOSIS — R1310 Dysphagia, unspecified: Secondary | ICD-10-CM | POA: Diagnosis not present

## 2019-02-02 DIAGNOSIS — F419 Anxiety disorder, unspecified: Secondary | ICD-10-CM | POA: Insufficient documentation

## 2019-02-02 DIAGNOSIS — R933 Abnormal findings on diagnostic imaging of other parts of digestive tract: Secondary | ICD-10-CM | POA: Diagnosis not present

## 2019-02-02 DIAGNOSIS — K219 Gastro-esophageal reflux disease without esophagitis: Secondary | ICD-10-CM | POA: Insufficient documentation

## 2019-02-02 DIAGNOSIS — G4730 Sleep apnea, unspecified: Secondary | ICD-10-CM | POA: Insufficient documentation

## 2019-02-02 DIAGNOSIS — Z79899 Other long term (current) drug therapy: Secondary | ICD-10-CM | POA: Insufficient documentation

## 2019-02-02 DIAGNOSIS — Z856 Personal history of leukemia: Secondary | ICD-10-CM | POA: Diagnosis not present

## 2019-02-02 DIAGNOSIS — Z6841 Body Mass Index (BMI) 40.0 and over, adult: Secondary | ICD-10-CM | POA: Diagnosis not present

## 2019-02-02 DIAGNOSIS — N189 Chronic kidney disease, unspecified: Secondary | ICD-10-CM | POA: Insufficient documentation

## 2019-02-02 DIAGNOSIS — E7800 Pure hypercholesterolemia, unspecified: Secondary | ICD-10-CM | POA: Insufficient documentation

## 2019-02-02 DIAGNOSIS — Z7901 Long term (current) use of anticoagulants: Secondary | ICD-10-CM | POA: Diagnosis not present

## 2019-02-02 DIAGNOSIS — E785 Hyperlipidemia, unspecified: Secondary | ICD-10-CM | POA: Diagnosis not present

## 2019-02-02 DIAGNOSIS — F329 Major depressive disorder, single episode, unspecified: Secondary | ICD-10-CM | POA: Insufficient documentation

## 2019-02-02 DIAGNOSIS — K31819 Angiodysplasia of stomach and duodenum without bleeding: Secondary | ICD-10-CM | POA: Diagnosis not present

## 2019-02-02 DIAGNOSIS — F418 Other specified anxiety disorders: Secondary | ICD-10-CM | POA: Diagnosis not present

## 2019-02-02 DIAGNOSIS — K3189 Other diseases of stomach and duodenum: Secondary | ICD-10-CM | POA: Insufficient documentation

## 2019-02-02 DIAGNOSIS — I129 Hypertensive chronic kidney disease with stage 1 through stage 4 chronic kidney disease, or unspecified chronic kidney disease: Secondary | ICD-10-CM | POA: Diagnosis not present

## 2019-02-02 DIAGNOSIS — Z86711 Personal history of pulmonary embolism: Secondary | ICD-10-CM | POA: Insufficient documentation

## 2019-02-02 DIAGNOSIS — R1013 Epigastric pain: Secondary | ICD-10-CM | POA: Diagnosis present

## 2019-02-02 DIAGNOSIS — R1010 Upper abdominal pain, unspecified: Secondary | ICD-10-CM | POA: Diagnosis not present

## 2019-02-02 DIAGNOSIS — K2960 Other gastritis without bleeding: Secondary | ICD-10-CM | POA: Diagnosis not present

## 2019-02-02 HISTORY — DX: Epigastric pain: R10.13

## 2019-02-02 HISTORY — DX: Morbid (severe) obesity due to excess calories: E66.01

## 2019-02-02 HISTORY — PX: ESOPHAGOGASTRODUODENOSCOPY (EGD) WITH PROPOFOL: SHX5813

## 2019-02-02 SURGERY — ESOPHAGOGASTRODUODENOSCOPY (EGD) WITH PROPOFOL
Anesthesia: General

## 2019-02-02 MED ORDER — PROPOFOL 10 MG/ML IV BOLUS
INTRAVENOUS | Status: DC | PRN
  Administered 2019-02-02: 10 mg via INTRAVENOUS
  Administered 2019-02-02: 50 mg via INTRAVENOUS

## 2019-02-02 MED ORDER — PROPOFOL 500 MG/50ML IV EMUL
INTRAVENOUS | Status: DC | PRN
  Administered 2019-02-02: 140 ug/kg/min via INTRAVENOUS

## 2019-02-02 MED ORDER — LIDOCAINE HCL (CARDIAC) PF 100 MG/5ML IV SOSY
PREFILLED_SYRINGE | INTRAVENOUS | Status: DC | PRN
  Administered 2019-02-02: 100 mg via INTRATRACHEAL

## 2019-02-02 MED ORDER — GLYCOPYRROLATE 0.2 MG/ML IJ SOLN
INTRAMUSCULAR | Status: DC | PRN
  Administered 2019-02-02: 0.2 mg via INTRAVENOUS

## 2019-02-02 MED ORDER — SODIUM CHLORIDE 0.9 % IV SOLN
INTRAVENOUS | Status: DC
  Administered 2019-02-02 (×2): via INTRAVENOUS

## 2019-02-02 NOTE — Anesthesia Preprocedure Evaluation (Signed)
Anesthesia Evaluation  Patient identified by MRN, date of birth, ID band Patient awake    Reviewed: Allergy & Precautions, NPO status , Patient's Chart, lab work & pertinent test results  History of Anesthesia Complications Negative for: history of anesthetic complications  Airway Mallampati: II  TM Distance: >3 FB Neck ROM: Full    Dental no notable dental hx.    Pulmonary sleep apnea and Continuous Positive Airway Pressure Ventilation , neg COPD,    breath sounds clear to auscultation- rhonchi (-) wheezing      Cardiovascular hypertension, Pt. on medications + Peripheral Vascular Disease  (-) CAD, (-) Past MI, (-) Cardiac Stents and (-) CABG  Rhythm:Regular Rate:Normal - Systolic murmurs and - Diastolic murmurs    Neuro/Psych  Headaches, neg Seizures PSYCHIATRIC DISORDERS Anxiety Depression    GI/Hepatic Neg liver ROS, GERD  ,  Endo/Other  negative endocrine ROSneg diabetes  Renal/GU negative Renal ROS     Musculoskeletal  (+) Arthritis ,   Abdominal (+) + obese,   Peds  Hematology negative hematology ROS (+)   Anesthesia Other Findings Past Medical History: No date: Anginal pain (HCC)     Comment:  atypical chest pain No date: Anxiety No date: Arthritis No date: Back pain No date: Cancer (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) No date: Macular degeneration 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) No date: Morbid obesity ([place]) No date: Obesity No date: Pulmonary embolism (HCC) No date: PVD (peripheral vascular disease)  (HCC) No date: Sleep apnea   Reproductive/Obstetrics                             Anesthesia Physical Anesthesia Plan  ASA: III  Anesthesia Plan: General   Post-op Pain Management:    Induction: Intravenous  PONV Risk Score and Plan: 2 and Propofol infusion  Airway Management Planned: Natural Airway  Additional Equipment:   Intra-op Plan:   Post-operative Plan:   Informed Consent: I have reviewed the patients History and Physical, chart, labs and discussed the procedure including the risks, benefits and alternatives for the proposed anesthesia with the patient or authorized representative who has indicated his/her understanding and acceptance.     Dental advisory given  Plan Discussed with: CRNA and Anesthesiologist  Anesthesia Plan Comments:         Anesthesia Quick Evaluation

## 2019-02-02 NOTE — H&P (Addendum)
Outpatient short stay form Pre-procedure 02/02/2019 8:33 AM Jamie Phillips K. Alice Reichert, M.D.  Primary Physician: Frazier Richards, M.D.  Reason for visit:  Epigastric pain, Dysphagia  History of present illness:  Patient with recent onset of epigastric pain and intermittent dysphagia. Some persistent RLQ pain since right colectomy for diverticulitis in June 2020. Recent barium swallow showed corkscrew esophagus distally.    No current facility-administered medications for this encounter.   Medications Prior to Admission  Medication Sig Dispense Refill Last Dose  . sucralfate (CARAFATE) 1 g tablet Take 1 g by mouth 4 (four) times daily -  with meals and at bedtime.     . traMADol (ULTRAM) 50 MG tablet Take by mouth every 6 (six) hours as needed.     Marland Kitchen acetaminophen (TYLENOL) 650 MG CR tablet Take 650-1,300 mg by mouth every 8 (eight) hours as needed for pain. TYLENOL ARTHRITIS     . albuterol (PROVENTIL HFA;VENTOLIN HFA) 108 (90 Base) MCG/ACT inhaler Inhale 2 puffs into the lungs every 6 (six) hours as needed for wheezing or shortness of breath.     . dexamethasone (DECADRON) 6 MG tablet Take 1 tablet (6 mg total) by mouth 2 (two) times daily with a meal. For 5 days 5 tablet 0   . ELIQUIS 5 MG TABS tablet TAKE 1 TABLET TWICE DAILY (Patient taking differently: Take 5 mg by mouth 2 (two) times daily. ) 180 tablet 6   . escitalopram (LEXAPRO) 10 MG tablet Take 10 mg by mouth daily.     Marland Kitchen gabapentin (NEURONTIN) 300 MG capsule Take 300 mg by mouth 2 (two) times daily.      Marland Kitchen loperamide (IMODIUM) 2 MG capsule Take 2 mg by mouth as needed for diarrhea or loose stools.     Marland Kitchen losartan-hydrochlorothiazide (HYZAAR) 100-12.5 MG tablet Take 1 tablet by mouth daily. Reported on 08/22/2015     . Multiple Vitamins-Minerals (PRESERVISION AREDS 2 PO) Take 1 capsule by mouth 2 (two) times daily.      . pantoprazole (PROTONIX) 40 MG tablet Take 1 tablet (40 mg total) by mouth 2 (two) times daily for 14 days. (Patient  taking differently: Take 40 mg by mouth daily. ) 28 tablet 0   . pravastatin (PRAVACHOL) 40 MG tablet Take 40 mg by mouth daily.      . propranolol (INDERAL) 40 MG tablet Take 40 mg by mouth 2 (two) times a day.     Marland Kitchen tiZANidine (ZANAFLEX) 4 MG tablet Take 4 mg by mouth at bedtime as needed for muscle spasms.         Allergies  Allergen Reactions  . Oxycodone Other (See Comments)    Reaction:  Hallucinations  . Morphine Other (See Comments)    Reaction:  Hallucinations     Past Medical History:  Diagnosis Date  . Anginal pain ([place])    atypical chest pain  . Anxiety   . Arthritis   . Back pain   . Cancer ([place])    cll  . Chronic kidney disease   . Chronic kidney disease   . CLL (chronic lymphocytic leukemia) ([place])   . Depression   . Epigastric pain   . GERD (gastroesophageal reflux disease)   . Headache   . Hemorrhoids   . Hypercholesteremia   . Hyperlipidemia   . Hypertension   . 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 Macular degeneration   . MGUS (monoclonal gammopathy of unknown significance)   . Morbid obesity ([place])   .  Obesity   . Pulmonary embolism ([place])   . PVD (peripheral vascular disease) ([place])   . Sleep apnea     Review of systems:  Otherwise negative.    Physical Exam  Gen: Alert, oriented. Appears stated age.  HEENT: [place]/AT. PERRLA. Lungs: CTA, no wheezes. CV: RR nl S1, S2. Abd: soft, benign, no masses. BS+ Ext: No edema. Pulses 2+    Planned procedures: Proceed with EGD. The patient understands the nature of the planned procedure, indications, risks, alternatives and potential complications including but not limited to bleeding, infection, perforation, damage to internal organs and possible oversedation/side effects from anesthesia. The patient agrees and gives consent to proceed.  Please refer to procedure notes for findings, recommendations and patient disposition/instructions.     Kambrey Hagger K. Alice Reichert,  M.D. Gastroenterology 02/02/2019  8:33 AM

## 2019-02-02 NOTE — Anesthesia Post-op Follow-up Note (Signed)
Anesthesia QCDR form completed.        

## 2019-02-02 NOTE — Transfer of Care (Signed)
Immediate Anesthesia Transfer of Care Note  Patient: Jamie Phillips  Procedure(s) Performed: ESOPHAGOGASTRODUODENOSCOPY (EGD) WITH PROPOFOL (N/A )  Patient Location: Endoscopy Unit  Anesthesia Type:General  Level of Consciousness: awake, oriented and patient cooperative  Airway & Oxygen Therapy: Patient Spontanous Breathing and Patient connected to face mask oxygen  Post-op Assessment: Report given to RN and Post -op Vital signs reviewed and stable  Post vital signs: Reviewed and stable  Last Vitals:  Vitals Value Taken Time  BP    Temp    Pulse 64 02/02/19 0925  Resp 14 02/02/19 0925  SpO2 99 % 02/02/19 0925  Vitals shown include unvalidated device data.  Last Pain: There were no vitals filed for this visit.       Complications: No apparent anesthesia complications

## 2019-02-02 NOTE — Anesthesia Postprocedure Evaluation (Signed)
Anesthesia Post Note  Patient: Jamie Phillips  Procedure(s) Performed: ESOPHAGOGASTRODUODENOSCOPY (EGD) WITH PROPOFOL (N/A )  Patient location during evaluation: Endoscopy Anesthesia Type: General Level of consciousness: awake and alert and oriented Pain management: pain level controlled Vital Signs Assessment: post-procedure vital signs reviewed and stable Respiratory status: spontaneous breathing, nonlabored ventilation and respiratory function stable Cardiovascular status: blood pressure returned to baseline and stable Postop Assessment: no signs of nausea or vomiting Anesthetic complications: no     Last Vitals:  Vitals:   02/02/19 0940 02/02/19 0950  BP: 125/65 (!) 127/54  Pulse: 63 62  Resp: (!) 23 19  Temp:    SpO2: 94% 90%    Last Pain:  Vitals:   02/02/19 0920  TempSrc: Tympanic                 Nichoel Digiulio

## 2019-02-02 NOTE — Op Note (Signed)
[facility] Gastroenterology Patient Name: Jamie Phillips Procedure Date: 02/02/2019 9:12 AM MRN: [id number] Account #: [id number] Date of Birth: 1941/03/02 Admit Type: Outpatient Age: 78 Room: [facility] ENDO ROOM 2 Gender: Female Note Status: Finalized Procedure:            Upper GI endoscopy Indications:          Dysphagia, Abnormal UGI series Providers:            Benay Pike. Alice Reichert MD, MD Referring MD:         Ocie Cornfield. Ouida Sills MD, MD (Referring MD) Medicines:            Propofol per Anesthesia Complications:        No immediate complications. Procedure:            Pre-Anesthesia Assessment:                       - The risks and benefits of the procedure and the                        sedation options and risks were discussed with the                        patient. All questions were answered and informed                        consent was obtained.                       - Patient identification and proposed procedure were                        verified prior to the procedure by the nurse. The                        procedure was verified in the procedure room.                       - ASA Grade Assessment: III - A patient with severe                        systemic disease.                       - After reviewing the risks and benefits, the patient                        was deemed in satisfactory condition to undergo the                        procedure.                       After obtaining informed consent, the endoscope was                        passed under direct vision. Throughout the procedure,                        the patient's blood pressure, pulse, and oxygen  saturations were monitored continuously. The Endoscope                        was introduced through the mouth, and advanced to the                        third part of duodenum. The upper GI endoscopy was                        accomplished without difficulty. The  patient tolerated                        the procedure well. Findings:      Abnormal motility was noted in the distal esophagus. The cricopharyngeus       was abnormal. There is spasticity of the esophageal body. The distal       esophagus/lower esophageal sphincter is open. Tertiary peristaltic waves       are noted.      There is no endoscopic evidence of inflammation, stenosis or stricture       in the entire esophagus.      Striped moderately erythematous mucosa without bleeding was found in the       gastric antrum. Biopsies were taken with a cold forceps for histology.       Assess for Gastric Antral Vascular Ectasia (GAVE).      The cardia and gastric fundus were normal on retroflexion.      The examined duodenum was normal. Impression:           - Abnormal esophageal motility, suspicious for                        esophageal spasm.                       - Erythematous mucosa in the antrum. Biopsied.                       - Normal examined duodenum. Recommendation:       - Patient has a contact number available for                        emergencies. The signs and symptoms of potential                        delayed complications were discussed with the patient.                        Return to normal activities tomorrow. Written discharge                        instructions were provided to the patient.                       - Resume previous diet.                       - Continue present medications.                       - Await pathology results.                       -  Refer to Gastrointestinal Motility Specialist at                        appointment to be scheduled.                       - Return to physician assistant in 3 months.                       - The findings and recommendations were discussed with                        the patient. Procedure Code(s):    --- Professional ---                       [phone], Esophagogastroduodenoscopy, flexible, transoral;                         with biopsy, single or multiple Diagnosis Code(s):    --- Professional ---                       R93.3, Abnormal findings on diagnostic imaging of other                        parts of digestive tract                       R13.10, Dysphagia, unspecified                       K31.89, Other diseases of stomach and duodenum                       K22.4, Dyskinesia of esophagus CPT copyright 2019 [employer]. All rights reserved. The codes documented in this report are preliminary and upon coder review may  be revised to meet current compliance requirements. Efrain Sella MD, MD 02/02/2019 9:26:58 AM This report has been signed electronically. Number of Addenda: 0 Note Initiated On: 02/02/2019 9:12 AM Estimated Blood Loss: Estimated blood loss: none.      [facility]

## 2019-02-03 ENCOUNTER — Encounter: Payer: Self-pay | Admitting: Internal Medicine

## 2019-02-03 LAB — SURGICAL PATHOLOGY

## 2019-02-11 DIAGNOSIS — M542 Cervicalgia: Secondary | ICD-10-CM | POA: Diagnosis not present

## 2019-02-11 DIAGNOSIS — R197 Diarrhea, unspecified: Secondary | ICD-10-CM | POA: Diagnosis not present

## 2019-02-12 DIAGNOSIS — M542 Cervicalgia: Secondary | ICD-10-CM | POA: Diagnosis not present

## 2019-02-12 DIAGNOSIS — R197 Diarrhea, unspecified: Secondary | ICD-10-CM | POA: Diagnosis not present

## 2019-02-12 DIAGNOSIS — I5040 Unspecified combined systolic (congestive) and diastolic (congestive) heart failure: Secondary | ICD-10-CM | POA: Diagnosis not present

## 2019-02-25 DIAGNOSIS — N1831 Chronic kidney disease, stage 3a: Secondary | ICD-10-CM | POA: Diagnosis not present

## 2019-02-25 DIAGNOSIS — F325 Major depressive disorder, single episode, in full remission: Secondary | ICD-10-CM | POA: Diagnosis not present

## 2019-02-25 DIAGNOSIS — I129 Hypertensive chronic kidney disease with stage 1 through stage 4 chronic kidney disease, or unspecified chronic kidney disease: Secondary | ICD-10-CM | POA: Diagnosis not present

## 2019-02-25 DIAGNOSIS — L918 Other hypertrophic disorders of the skin: Secondary | ICD-10-CM | POA: Diagnosis not present

## 2019-03-04 ENCOUNTER — Inpatient Hospital Stay: Payer: Medicare HMO

## 2019-03-04 ENCOUNTER — Other Ambulatory Visit: Payer: Self-pay

## 2019-03-04 ENCOUNTER — Inpatient Hospital Stay: Payer: Medicare HMO | Admitting: Hematology and Oncology

## 2019-03-04 DIAGNOSIS — D472 Monoclonal gammopathy: Secondary | ICD-10-CM | POA: Insufficient documentation

## 2019-03-04 NOTE — Progress Notes (Signed)
[facility]  [address] [place], [place] [address] Phone: [phone]  Fax: [phone]   Clinic Day:  03/05/2019  Referring physician: Kirk Ruths, MD  Chief Complaint: Jamie Phillips is a 78 y.o. 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   HPI: The patient was last seen in the medical oncology clinic on 09/03/2018 for new patient assessment. At that time, she denied any fevers, sweats or weight loss. She had chronic fatigue. She had sleep apnea and had not had her machine calibrated in several years. Exam revealed no adenopathy or hepatosplenomegaly. Hematocrit 38.6, hemoglobin 12.5, platelets 233,000, WBC 17,000 (ALC 11,8000). Creatinine was 1.12. Calcium 8.8. M-spike was 0.  Free light chain ratio was 5.24 (0.26 - 1.65).  She continued Eliquis 5 mg BID.   She was seen by Mortimer Fries, PA on 09/07/2018.  She had increasing shortness of breath for 2 days. She had leg pain, and headaches.  She had no cough or congestion. She denied fevers. Chest CT angiogram was negative for PE. CBC was normal. She received albuterol and prednisone taper.  She was admitted to [facility] from 10/09/2018 - 10/17/2018 with RLQ pain. Renal stone study CT on 10/09/2018 showed stranding of the cecum and nonvisualization of the appendix. Acute appendicitis was suspected.  She had a diagnostic laparoscopy on 10/10/2018.  She was found to have an ischemic portion of the cecum; a right colectomy was performed. Pathology revealed perforated diverticulitis, acute suppurative inflammation, and serositis.  Eighteen lymph nodes were negative.  Dr. Windell Moment felt she had a component of significant gastritis because pain improved with Carafate and PPIs. Abdomen and pelvis CT on 10/14/2018 showed normal anastomosis without leak or abscess. HIDA scan was negative for acute cholecystitis.  Bilateral  mammogram on 10/22/2018 revealed no malignancy.  Patient had an initial consult with Dr. Gustavo Lah on 11/02/2018. She described epigastric pain for over several months. She had dysphagia. Her food felt like it would not pass or dissolved in her mid to lower esophageal area.  Episodes seem to trigger her epigastric pain. Dr. Gustavo Lah planned for a upper endoscopy and x-ray barium swallow. She continued anticoagulation.   She was admitted to [facility] on 12/14/2018 - 12/20/2018.  She was positive for COVID-19 which she contracted from her daughter. She was asymptomatic at first. After x 1 week she began to have chills, nausea, emesis, and diarrhea. She had generalized weakness, loss of appetite, and mild shortness of breath. Patient denied cough, dysuria, leg swelling, and rashes. She was hypoxic. Chest CT on 10/16/2018 showed multiple ill-defined airspace opacities throughout both lungs peripherally most c/w multi-focal pneumonia. She was treated with 5 days of Remdesivir. She required 2 L/min nasal cannula. She was treated with IV Solu-Medrol. She was given 5 day supply of Decadron and 10 day steroid treatment.   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 There was a small hiatal hernia. There were no persisting strictures.   She was seen by Dr. Alice Reichert on 02/02/2019. She had epigastric pain and dysphagia.  EGD by Dr. Alice Reichert on 02/02/2019 revealed abnormal esophageal motility, suspicious for esophageal spasm. There was erythematous mucosa in the antrum. The duodenum was normal.   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 Negative for H. Pylori, dysplasia, and malignancy.   CBC followed:  10/09/2018: Hematocrit 39.4, hemoglobin 12.5, MCV 96.3, platelets 242,000, WBC 17,200. 10/10/2018: Hematocrit 34.6, hemoglobin 10.8, MCV 98.0, platelets 225,000, WBC 18,300. 10/11/2018: Hematocrit 33.7, hemoglobin 10.3, MCV 99.4, platelets 225,000, WBC 18,400. 10/12/2018: Hematocrit 28.5, hemoglobin 8.9, MCV 98.3, platelets 169,000, WBC 13,900.  10/13/2018: Hematocrit 29.2, hemoglobin 9.0, MCV 100.3, platelets 224,000, WBC 15,800.  10/17/2018: Hematocrit 27.9, hemoglobin 8.5, MCV 97.9, platelets 260,000, WBC 18,600.  12/13/2018: Hematocrit 34.6, hemoglobin 10.8, MCV 92.0, platelets 267,000, WBC 9,900.  12/15/2018: Hematocrit 32.5, hemoglobin 10.0, MCV 95.3, platelets 259,000, WBC 10,900. Ferritin 79. 12/16/2018: Hematocrit 32.3, hemoglobin 9.80, MCV 94.4, platelets 279,000, WBC 9,700.   Ferritin 85. 12/17/2018: Hematocrit 34.7, hemoglobin 10.8, MCV 93.5, platelets 330,000, WBC 12,900. Ferritin 94. 12/18/2018: Hematocrit 35.2, hemoglobin 10.9, MCV 92.4, platelets 368,000, WBC 13,700. Ferritin 86. 12/20/2018: Hematocrit 35.2, hemoglobin 10.9, MCV 91.7, platelets 427,000, WBC 15,400.  During the interim, she notes that her CPAP machine has still not been calibrated. She said COVID didn't bother as much as colon issues. She has had diarrhea. She was given a powder to help with diarrhea episodes and is doing much better. She still has some weakness from COVID.  Her daughter was on a ventilator and was was discharged home with oxygen. Daughter had 2 strokes last year.    Symptomatically, she feel fatigued and weak.  She describes back pain.  She was told she needed surgery, but prefers not to have surgery.  She denies any other issues or residual side effects from COVID-19 aside from weakness.   Past Medical History:  Diagnosis Date  . Anginal pain ([place])    atypical chest pain  . Anxiety   . Arthritis   . Back pain   . Cancer ([place])    cll  . Chronic kidney disease   . Chronic kidney disease    . CLL (chronic lymphocytic leukemia) ([place])   . Depression   . Epigastric pain   . GERD (gastroesophageal reflux disease)   . Headache   . Hemorrhoids   . Hypercholesteremia   . Hyperlipidemia   . Hypertension   . 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 Macular degeneration   . MGUS (monoclonal gammopathy of unknown significance)   . Morbid obesity ([place])   . Obesity   . Pulmonary embolism ([place])   . PVD (peripheral vascular disease) ([place])   . Sleep apnea     Past Surgical History:  Procedure Laterality Date  . APPENDECTOMY    . BREAST BIOPSY Left 08/19/04   lt bx/clip-neg  . COLONOSCOPY    . COLONOSCOPY WITH PROPOFOL N/A 03/10/2017   Procedure: COLONOSCOPY WITH PROPOFOL;  Surgeon: [name], MD;  Location: [facility] ENDOSCOPY;  Service: Endoscopy;  Laterality: N/A;  . ESOPHAGOGASTRODUODENOSCOPY (EGD) WITH PROPOFOL N/A 02/02/2019   Procedure: ESOPHAGOGASTRODUODENOSCOPY (EGD) WITH PROPOFOL;  Surgeon: Toledo, Benay Pike, MD;  Location: [facility] ENDOSCOPY;  Service: Endoscopy;  Laterality: N/A;  . IVC FILTER INSERTION N/A 06/11/2016   Procedure: IVC Filter Insertion;  Surgeon: Katha Cabal, MD;  Location: [place] CV LAB;  Service: Cardiovascular;  Laterality: N/A;  . IVC FILTER REMOVAL N/A 05/06/2017   Procedure: IVC FILTER REMOVAL;  Surgeon: Katha Cabal, MD;  Location: [place] CV LAB;  Service: Cardiovascular;  Laterality: N/A;  . JOINT REPLACEMENT Left 06/17/2016  . LAPAROSCOPIC APPENDECTOMY N/A 10/10/2018   Procedure: APPENDECTOMY LAPAROSCOPIC ATTEMPTED CONVERTED TO OPEN;  Surgeon: Herbert Pun, MD;  Location: [facility] ORS;  Service: General;  Laterality: N/A;  . LAPAROSCOPY N/A 10/10/2018   Procedure: LAPAROSCOPY DIAGNOSTIC ATTEMPTED CONVERTED TO OPEN;  Surgeon: Herbert Pun, MD;  Location: [facility] ORS;  Service: General;  Laterality: N/A;  . neck fusion    . PARTIAL COLECTOMY Right 10/10/2018   Procedure: PARTIAL COLECTOMY;  Surgeon:  Herbert Pun, MD;  Location: [facility] ORS;  Service: General;  Laterality: Right;  . PERIPHERAL VASCULAR CATHETERIZATION N/A 08/15/2015   Procedure: pulmonary angiogram with lysis;  Surgeon: Katha Cabal, MD;  Location: [place] CV LAB;  Service: Cardiovascular;  Laterality: N/A;  . WRIST SURGERY Left 2000    Family History  Problem Relation Age of Onset  . Heart failure Father   . Congestive Heart Failure Father   . Pulmonary embolism Mother   . Breast cancer Neg Hx     Social History:  reports that she has never smoked. She has never used smokeless tobacco. She reports that she does not drink alcohol or use drugs. She does not smoke or drink alcohol. 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  She denies any known exposure to radiation or toxins. She takes care of her daughter who has had 2 strokes. She lives with her son in [place], [place]. The patient is alone today.  Allergies:  Allergies  Allergen Reactions  . Oxycodone Other (See Comments)    Reaction:  Hallucinations  . Morphine Other (See Comments)    Reaction:  Hallucinations    Current Medications: Current Outpatient Medications  Medication Sig Dispense Refill  . acetaminophen (TYLENOL) 650 MG CR tablet Take 650-1,300 mg by mouth every 8 (eight) hours as needed for pain. TYLENOL ARTHRITIS    . ELIQUIS 5 MG TABS tablet TAKE 1 TABLET TWICE DAILY (Patient taking differently: Take 5 mg by mouth 2 (two) times daily. ) 180 tablet 6  . escitalopram (LEXAPRO) 10 MG tablet Take 10 mg by mouth daily.    Marland Kitchen gabapentin (NEURONTIN) 300 MG capsule Take 300 mg by mouth 2 (two) times daily.     Marland Kitchen losartan-hydrochlorothiazide (HYZAAR) 100-12.5 MG tablet Take 1 tablet by mouth daily. Reported on 08/22/2015    . Multiple Vitamins-Minerals (PRESERVISION AREDS 2 PO) Take 1 capsule by mouth 2 (two) times daily.     . pantoprazole (PROTONIX) 40 MG tablet Take 1 tablet (40 mg total)  by mouth 2 (two) times daily for 14 days. (Patient taking differently: Take 40 mg by mouth daily. ) 28 tablet 0  . pravastatin (PRAVACHOL) 40 MG tablet Take 40 mg by mouth daily.     . propranolol (INDERAL) 40 MG tablet Take 40 mg by mouth 2 (two) times a day.    . sucralfate (CARAFATE) 1 g tablet Take 1 g by mouth 4 (four) times daily -  with meals and at bedtime.    Marland Kitchen albuterol (PROVENTIL HFA;VENTOLIN HFA) 108 (90 Base) MCG/ACT inhaler Inhale 2 puffs into the lungs every 6 (six) hours as needed for wheezing or shortness of breath.    . dexamethasone (DECADRON) 6 MG tablet Take 1 tablet (6 mg total) by mouth 2 (two) times daily with a meal. For 5 days (Patient not taking: Reported on 03/04/2019) 5 tablet 0  . loperamide (IMODIUM) 2 MG capsule Take 2 mg by mouth as needed for diarrhea or loose stools.    Marland Kitchen tiZANidine (ZANAFLEX) 4 MG tablet Take 4 mg  by mouth at bedtime as needed for muscle spasms.     . traMADol (ULTRAM) 50 MG tablet Take by mouth every 6 (six) hours as needed.     No current facility-administered medications for this visit.     Review of Systems  Constitutional: Positive for malaise/fatigue (chronic). Negative for chills, diaphoresis, fever and weight loss (up 6 pounds).       Feels "weak and tired".  HENT: Negative.  Negative for congestion, ear pain, hearing loss, nosebleeds, sinus pain and sore throat.   Eyes: Negative.  Negative for blurred vision, double vision and pain.  Respiratory: Negative for cough, hemoptysis, sputum production and shortness of breath.        Sleep apnea.  Cardiovascular: Negative.  Negative for chest pain, palpitations, orthopnea and claudication.  Gastrointestinal: Positive for diarrhea (improved). Negative for abdominal pain, blood in stool, constipation, melena, nausea and vomiting.  Genitourinary: Negative.  Negative for dysuria, frequency, hematuria and urgency.  Musculoskeletal: Positive for back pain (chronic) and joint pain. Negative for  myalgias.  Skin: Negative.   Neurological: Positive for weakness (generalized). Negative for dizziness, sensory change, speech change, focal weakness, seizures and headaches.  Endo/Heme/Allergies: Bruises/bleeds easily (chronic).  Psychiatric/Behavioral: Negative.  Negative for depression and memory loss.   Performance status (ECOG): 1  Vitals Blood pressure 133/70, pulse 61, temperature 97.7 F (36.5 C), temperature source Tympanic, resp. rate 18, height 5\' 5"  (1.651 m), weight 278 lb (126.1 kg), SpO2 99 %.   Physical Exam  Constitutional: She is oriented to person, place, and time and well-developed, well-nourished, and in no distress. No distress.  HENT:  Head: Normocephalic and atraumatic.  Mouth/Throat: Oropharynx is clear and moist. No oropharyngeal exudate.  Short curly gray hair.  Mask.  Eyes: Pupils are equal, round, and reactive to light. Conjunctivae and EOM are normal. No scleral icterus.  Glasses.  Blue eyes.  Neck: Normal range of motion. Neck supple. No JVD present.  Cardiovascular: Normal rate, regular rhythm and normal heart sounds. Exam reveals no gallop.  No murmur heard. Pulmonary/Chest: Effort normal and breath sounds normal. No respiratory distress. She has no wheezes. She has no rales.  Abdominal: Bowel sounds are normal. She exhibits no distension and no mass. There is no abdominal tenderness. There is no rebound and no guarding.  Musculoskeletal:        General: Edema (chronic) present.     Lumbar back: She exhibits pain.  Lymphadenopathy:    She has no cervical adenopathy.  Neurological: She is alert and oriented to person, place, and time.  Skin: Skin is dry. No rash noted. She is not diaphoretic. No erythema. No pallor.  Large right sided skin tag on hip.  Psychiatric: Mood, memory, affect and judgment normal.  Nursing note and vitals reviewed.  Nursing note and vitals reviewed.   No visits with results within 3 Day(s) from this visit.  Latest known  visit with results is:  Admission on 02/02/2019, Discharged on 02/02/2019  Component Date Value Ref Range Status  . SURGICAL PATHOLOGY 02/02/2019    Final-Edited                   Value:SURGICAL PATHOLOGY CASE: [id number] PATIENT: Jamie Phillips Surgical Pathology Report  Specimen Submitted: A. Stomach, antrum; cbx  Clinical History: Upper abdominal pain, GERD, dysphagia; Linear gastritis, tertiary peristaltic waves noted.   DIAGNOSIS: A.  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 - NEGATIVE FOR H. PYLORI, DYSPLASIA, AND MALIGNANCY.  GROSS DESCRIPTION: A. Labeled: C BX of stomach Received: In formalin Tissue fragment(s): 3 Size: From 0.2-0.3 cm Description: Tan soft tissue fragments Entirely submitted in 1 cassette.  Final Diagnosis performed by Quay Burow, MD.   Electronically signed 02/03/2019 2:08:43PM The electronic signature indicates that the named Attending Pathologist has evaluated the specimen Technical component performed at [facility], [address], [place], [place] [address] Lab: [phone] Dir: Rush Farmer, MD, MMM  Professional component performed at [facility], [address], [place], [place],  [address] Lab: [phone] Dir: Dellia Nims. Reuel Derby, MD    Assessment:  Jamie Phillips is a 78 y.o. 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  WBC has ranged between 15,700 - 31,600 without trend since 08/15/2015.  She was diagnosed with bilateral massive pulmonary emboli in 08/14/2015.  Chest CT angiogram on 08/14/2015  revealed anacute and subacute pulmonary emboli with evidence of right heart strain (RV/LV Ratio = 1.2) consistent with at least submassive (intermediate risk) PE.  Bilateral lower extremity duplex on 08/15/2015 revealed a right posterior tibial DVT.  She underwent pulmonary angiogram with lysis.  Factor V Leiden and prothrombin gene mutation were negative on 11/14/2015.  She is on chronic Eliquis.  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    M-spike has been followed (gm/dL):  0.1 on 07/16/2012, 0 on 07/28/2014, 0 on 01/26/2015, 0 on 08/01/2015, 0 on 11/14/2015, 0.3 on 07/30/2016, 0.4 on 07/29/2017, and 0 on 08/31/2018.  Kappa free light chains have been followed:  32.73 (ratio 4.41) on 08/01/2015, 26.2 (ratio 4.44) on 07/30/2016, 36.7 (ratio 4.96) on 07/29/2017, and 36.7 (ratio 5.24) on 08/31/2018.  She has epigastric pain and dysphagia.  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 There was a small hiatal hernia. There were no persisting strictures.  EGD on 02/02/2019 revealed abnormal esophageal motility, suspicious for esophageal spasm. There was erythematous mucosa in the antrum. The duodenum was normal.   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 There was no H. Pylori, dysplasia, or malignancy.   She was admitted to [facility] from 10/09/2018 - 10/17/2018 with perforated diverticulitis.  She underwent laparoscopic (hand assisted) right hemicolectomy.   She was admitted to [facility] on 12/14/2018 - 12/20/2018 with COVID-19. She was hypoxic. Chest CT on 10/16/2018 showed multiple ill-defined airspace opacities throughout both lungs peripherally most c/w  multi-focal pneumonia.  She was treated with 5 days of Remdesivir.  She required 2 L/min nasal cannula.  She received steroids.  Symptomatically, she is fatigued.  Exam reveals an apparent large right hip skin tag.  Plan: 1.   Labs today: CBC with dif, CMP, SPEP, LDH. 2.   Review interval hospitalizations and surgery. 3.   Chronic lymphocytic leukemia (CLL)             Clinically, she is doing well.  She has stage 0 CLL             Hematocrit 38.8.  Hemoglobin 12.2.  MCV 91.1.  Platelets 221,000.  WBC 13,500 ([place] 3200; ALC 8700).  No indication for treatment.             Continue surveillance. 4.   Monoclonal gammopathy                       IgA monoclonal gammopathy of unclear significance.             Creatinine 0.98.  Calcium 8.8.  Albumen 3.6.  SPEP pending today.  Continue surveillance every 6 months. 5.   Pulmonary embolism and RLE DVT             Patient had a massive pulmonary embolism requiring thrombolysis.             She is on indefinite anticoagulation.             Continue Eliquis. 6.  Chronic fatigue             Etiology remains unclear.  She has known sleep apnea without calibration of her CPAP machine in years.   Encourage follow-up calibration.             Check TSH (last checked 01/2015) at next lab draw. 7.   RTC in 6 months for MD assessment and labs (CBC with diff, CMP, SPEP, LDH, TSH).  I discussed the assessment and treatment plan with the patient.  The patient was provided an opportunity to ask questions and all were answered.  The patient agreed with the plan and demonstrated an understanding of the instructions.  The patient was advised to call back if the symptoms worsen or if the condition fails to improve as anticipated.  I provided 20 minutes (09:55 AM - 10:14 AM) of face-to-face time during this this encounter and > 50% was spent counseling as documented under my assessment and plan.    Lequita Asal, MD, PhD    03/05/2019, 10:14 AM   I, Samul Dada, am acting as a scribe for Lequita Asal, MD.  I, [place] Mike Gip, MD, have reviewed the above documentation for accuracy and completeness, and I agree with the above.

## 2019-03-04 NOTE — Progress Notes (Signed)
No new changes noted today. The patient name and DOB has been verified by phone today. 

## 2019-03-05 ENCOUNTER — Inpatient Hospital Stay: Payer: Medicare HMO

## 2019-03-05 ENCOUNTER — Encounter: Payer: Self-pay | Admitting: Hematology and Oncology

## 2019-03-05 ENCOUNTER — Inpatient Hospital Stay: Payer: Medicare HMO | Attending: Hematology and Oncology | Admitting: Hematology and Oncology

## 2019-03-05 VITALS — BP 133/70 | HR 61 | Temp 97.7°F | Resp 18 | Ht 65.0 in | Wt 278.0 lb

## 2019-03-05 DIAGNOSIS — N189 Chronic kidney disease, unspecified: Secondary | ICD-10-CM | POA: Insufficient documentation

## 2019-03-05 DIAGNOSIS — D472 Monoclonal gammopathy: Secondary | ICD-10-CM | POA: Diagnosis not present

## 2019-03-05 DIAGNOSIS — M549 Dorsalgia, unspecified: Secondary | ICD-10-CM | POA: Diagnosis not present

## 2019-03-05 DIAGNOSIS — C9110 Chronic lymphocytic leukemia of B-cell type not having achieved remission: Secondary | ICD-10-CM | POA: Diagnosis not present

## 2019-03-05 DIAGNOSIS — I739 Peripheral vascular disease, unspecified: Secondary | ICD-10-CM | POA: Diagnosis not present

## 2019-03-05 DIAGNOSIS — F419 Anxiety disorder, unspecified: Secondary | ICD-10-CM | POA: Diagnosis not present

## 2019-03-05 DIAGNOSIS — K219 Gastro-esophageal reflux disease without esophagitis: Secondary | ICD-10-CM | POA: Insufficient documentation

## 2019-03-05 DIAGNOSIS — Z86711 Personal history of pulmonary embolism: Secondary | ICD-10-CM | POA: Insufficient documentation

## 2019-03-05 DIAGNOSIS — G4730 Sleep apnea, unspecified: Secondary | ICD-10-CM | POA: Insufficient documentation

## 2019-03-05 DIAGNOSIS — R197 Diarrhea, unspecified: Secondary | ICD-10-CM | POA: Insufficient documentation

## 2019-03-05 DIAGNOSIS — E785 Hyperlipidemia, unspecified: Secondary | ICD-10-CM | POA: Insufficient documentation

## 2019-03-05 DIAGNOSIS — F418 Other specified anxiety disorders: Secondary | ICD-10-CM | POA: Diagnosis not present

## 2019-03-05 DIAGNOSIS — I2782 Chronic pulmonary embolism: Secondary | ICD-10-CM | POA: Diagnosis not present

## 2019-03-05 DIAGNOSIS — Z86718 Personal history of other venous thrombosis and embolism: Secondary | ICD-10-CM | POA: Insufficient documentation

## 2019-03-05 DIAGNOSIS — Z87442 Personal history of urinary calculi: Secondary | ICD-10-CM | POA: Insufficient documentation

## 2019-03-05 DIAGNOSIS — Z79899 Other long term (current) drug therapy: Secondary | ICD-10-CM | POA: Insufficient documentation

## 2019-03-05 DIAGNOSIS — K2970 Gastritis, unspecified, without bleeding: Secondary | ICD-10-CM | POA: Diagnosis not present

## 2019-03-05 DIAGNOSIS — K449 Diaphragmatic hernia without obstruction or gangrene: Secondary | ICD-10-CM | POA: Diagnosis not present

## 2019-03-05 DIAGNOSIS — E7800 Pure hypercholesterolemia, unspecified: Secondary | ICD-10-CM | POA: Insufficient documentation

## 2019-03-05 DIAGNOSIS — I129 Hypertensive chronic kidney disease with stage 1 through stage 4 chronic kidney disease, or unspecified chronic kidney disease: Secondary | ICD-10-CM | POA: Insufficient documentation

## 2019-03-05 DIAGNOSIS — R1310 Dysphagia, unspecified: Secondary | ICD-10-CM | POA: Diagnosis not present

## 2019-03-05 DIAGNOSIS — R5383 Other fatigue: Secondary | ICD-10-CM

## 2019-03-05 DIAGNOSIS — Z7901 Long term (current) use of anticoagulants: Secondary | ICD-10-CM | POA: Insufficient documentation

## 2019-03-05 DIAGNOSIS — Z8673 Personal history of transient ischemic attack (TIA), and cerebral infarction without residual deficits: Secondary | ICD-10-CM | POA: Diagnosis not present

## 2019-03-05 DIAGNOSIS — C9190 Lymphoid leukemia, unspecified not having achieved remission: Secondary | ICD-10-CM | POA: Diagnosis not present

## 2019-03-05 LAB — COMPREHENSIVE METABOLIC PANEL
ALT: 16 U/L (ref 0–44)
AST: 22 U/L (ref 15–41)
Albumin: 3.6 g/dL (ref 3.5–5.0)
Alkaline Phosphatase: 92 U/L (ref 38–126)
Anion gap: 10 (ref 5–15)
BUN: 17 mg/dL (ref 8–23)
CO2: 29 mmol/L (ref 22–32)
Calcium: 8.8 mg/dL — ABNORMAL LOW (ref 8.9–10.3)
Chloride: 103 mmol/L (ref 98–111)
Creatinine, Ser: 0.98 mg/dL (ref 0.44–1.00)
GFR calc Af Amer: >60 mL/min (ref >60)
GFR calc non Af Amer: 56 mL/min — ABNORMAL LOW (ref >60)
Glucose, Bld: 145 mg/dL — ABNORMAL HIGH (ref 70–99)
Potassium: 4 mmol/L (ref 3.5–5.1)
Sodium: 142 mmol/L (ref 135–145)
Total Bilirubin: 0.6 mg/dL (ref 0.3–1.2)
Total Protein: 6.3 g/dL — ABNORMAL LOW (ref 6.5–8.1)

## 2019-03-05 LAB — CBC WITH DIFFERENTIAL/PLATELET
Abs Immature Granulocytes: 0.04 10*3/uL (ref 0.00–0.07)
Basophils Absolute: 0.1 10*3/uL (ref 0.0–0.1)
Basophils Relative: 1 %
Eosinophils Absolute: 0.7 10*3/uL — ABNORMAL HIGH (ref 0.0–0.5)
Eosinophils Relative: 5 %
HCT: 38.8 % (ref 36.0–46.0)
Hemoglobin: 12.2 g/dL (ref 12.0–15.0)
Immature Granulocytes: 0 %
Lymphocytes Relative: 64 %
Lymphs Abs: 8.7 10*3/uL — ABNORMAL HIGH (ref 0.7–4.0)
MCH: 28.6 pg (ref 26.0–34.0)
MCHC: 31.4 g/dL (ref 30.0–36.0)
MCV: 91.1 fL (ref 80.0–100.0)
Monocytes Absolute: 0.8 10*3/uL (ref 0.1–1.0)
Monocytes Relative: 6 %
Neutro Abs: 3.2 10*3/uL (ref 1.7–7.7)
Neutrophils Relative %: 24 %
Platelets: 221 10*3/uL (ref 150–400)
RBC: 4.26 MIL/uL (ref 3.87–5.11)
RDW: 16.9 % — ABNORMAL HIGH (ref 11.5–15.5)
WBC: 13.5 10*3/uL — ABNORMAL HIGH (ref 4.0–10.5)
nRBC: 0 % (ref 0.0–0.2)

## 2019-03-05 LAB — LACTATE DEHYDROGENASE: LDH: 180 U/L (ref 98–192)

## 2019-03-07 DIAGNOSIS — R5383 Other fatigue: Secondary | ICD-10-CM | POA: Insufficient documentation

## 2019-03-09 LAB — PROTEIN ELECTROPHORESIS, SERUM
A/G Ratio: 1.4 (ref 0.7–1.7)
Albumin ELP: 3.6 g/dL (ref 2.9–4.4)
Alpha-1-Globulin: 0.2 g/dL (ref 0.0–0.4)
Alpha-2-Globulin: 0.8 g/dL (ref 0.4–1.0)
Beta Globulin: 1.1 g/dL (ref 0.7–1.3)
Gamma Globulin: 0.4 g/dL (ref 0.4–1.8)
Globulin, Total: 2.5 g/dL (ref 2.2–3.9)
Total Protein ELP: 6.1 g/dL (ref 6.0–8.5)

## 2019-03-11 DIAGNOSIS — R1900 Intra-abdominal and pelvic swelling, mass and lump, unspecified site: Secondary | ICD-10-CM | POA: Diagnosis not present

## 2019-03-29 DIAGNOSIS — I2699 Other pulmonary embolism without acute cor pulmonale: Secondary | ICD-10-CM | POA: Diagnosis not present

## 2019-03-29 DIAGNOSIS — I509 Heart failure, unspecified: Secondary | ICD-10-CM | POA: Diagnosis not present

## 2019-03-29 DIAGNOSIS — G4733 Obstructive sleep apnea (adult) (pediatric): Secondary | ICD-10-CM | POA: Diagnosis not present

## 2019-03-29 DIAGNOSIS — I2510 Atherosclerotic heart disease of native coronary artery without angina pectoris: Secondary | ICD-10-CM | POA: Diagnosis not present

## 2019-03-29 DIAGNOSIS — I10 Essential (primary) hypertension: Secondary | ICD-10-CM | POA: Diagnosis not present

## 2019-03-29 DIAGNOSIS — I5040 Unspecified combined systolic (congestive) and diastolic (congestive) heart failure: Secondary | ICD-10-CM | POA: Diagnosis not present

## 2019-03-29 DIAGNOSIS — R0600 Dyspnea, unspecified: Secondary | ICD-10-CM | POA: Diagnosis not present

## 2019-03-29 DIAGNOSIS — E782 Mixed hyperlipidemia: Secondary | ICD-10-CM | POA: Diagnosis not present

## 2019-04-01 DIAGNOSIS — R1900 Intra-abdominal and pelvic swelling, mass and lump, unspecified site: Secondary | ICD-10-CM | POA: Diagnosis not present

## 2019-04-01 DIAGNOSIS — D214 Benign neoplasm of connective and other soft tissue of abdomen: Secondary | ICD-10-CM | POA: Diagnosis not present

## 2019-04-26 DIAGNOSIS — R1312 Dysphagia, oropharyngeal phase: Secondary | ICD-10-CM | POA: Diagnosis not present

## 2019-04-26 DIAGNOSIS — K224 Dyskinesia of esophagus: Secondary | ICD-10-CM | POA: Diagnosis not present

## 2019-04-28 DIAGNOSIS — G4733 Obstructive sleep apnea (adult) (pediatric): Secondary | ICD-10-CM | POA: Diagnosis not present

## 2019-04-29 ENCOUNTER — Other Ambulatory Visit: Payer: Self-pay | Admitting: Gastroenterology

## 2019-04-29 DIAGNOSIS — R1310 Dysphagia, unspecified: Secondary | ICD-10-CM

## 2019-05-03 ENCOUNTER — Other Ambulatory Visit: Payer: Self-pay | Admitting: Gastroenterology

## 2019-05-03 DIAGNOSIS — R1310 Dysphagia, unspecified: Secondary | ICD-10-CM

## 2019-05-03 DIAGNOSIS — N183 Chronic kidney disease, stage 3 unspecified: Secondary | ICD-10-CM | POA: Diagnosis not present

## 2019-05-03 DIAGNOSIS — N1831 Chronic kidney disease, stage 3a: Secondary | ICD-10-CM | POA: Diagnosis not present

## 2019-05-03 DIAGNOSIS — K224 Dyskinesia of esophagus: Secondary | ICD-10-CM

## 2019-05-03 DIAGNOSIS — R739 Hyperglycemia, unspecified: Secondary | ICD-10-CM | POA: Diagnosis not present

## 2019-05-03 DIAGNOSIS — R7309 Other abnormal glucose: Secondary | ICD-10-CM | POA: Diagnosis not present

## 2019-05-03 DIAGNOSIS — F325 Major depressive disorder, single episode, in full remission: Secondary | ICD-10-CM | POA: Diagnosis not present

## 2019-05-03 DIAGNOSIS — I129 Hypertensive chronic kidney disease with stage 1 through stage 4 chronic kidney disease, or unspecified chronic kidney disease: Secondary | ICD-10-CM | POA: Diagnosis not present

## 2019-05-03 DIAGNOSIS — E7800 Pure hypercholesterolemia, unspecified: Secondary | ICD-10-CM | POA: Diagnosis not present

## 2019-05-10 ENCOUNTER — Ambulatory Visit: Payer: Medicare HMO | Attending: Gastroenterology

## 2019-05-10 DIAGNOSIS — F325 Major depressive disorder, single episode, in full remission: Secondary | ICD-10-CM | POA: Diagnosis not present

## 2019-05-10 DIAGNOSIS — R739 Hyperglycemia, unspecified: Secondary | ICD-10-CM | POA: Diagnosis not present

## 2019-05-10 DIAGNOSIS — G4733 Obstructive sleep apnea (adult) (pediatric): Secondary | ICD-10-CM | POA: Diagnosis not present

## 2019-05-10 DIAGNOSIS — I129 Hypertensive chronic kidney disease with stage 1 through stage 4 chronic kidney disease, or unspecified chronic kidney disease: Secondary | ICD-10-CM | POA: Diagnosis not present

## 2019-05-10 DIAGNOSIS — E7800 Pure hypercholesterolemia, unspecified: Secondary | ICD-10-CM | POA: Diagnosis not present

## 2019-05-10 DIAGNOSIS — M5489 Other dorsalgia: Secondary | ICD-10-CM | POA: Diagnosis not present

## 2019-05-10 DIAGNOSIS — I2692 Saddle embolus of pulmonary artery without acute cor pulmonale: Secondary | ICD-10-CM | POA: Diagnosis not present

## 2019-05-10 DIAGNOSIS — M5136 Other intervertebral disc degeneration, lumbar region: Secondary | ICD-10-CM | POA: Diagnosis not present

## 2019-05-10 DIAGNOSIS — N1831 Chronic kidney disease, stage 3a: Secondary | ICD-10-CM | POA: Diagnosis not present

## 2019-05-10 DIAGNOSIS — M47816 Spondylosis without myelopathy or radiculopathy, lumbar region: Secondary | ICD-10-CM | POA: Diagnosis not present

## 2019-05-10 DIAGNOSIS — Z9989 Dependence on other enabling machines and devices: Secondary | ICD-10-CM | POA: Diagnosis not present

## 2019-05-17 DIAGNOSIS — H2512 Age-related nuclear cataract, left eye: Secondary | ICD-10-CM | POA: Diagnosis not present

## 2019-05-17 DIAGNOSIS — M48062 Spinal stenosis, lumbar region with neurogenic claudication: Secondary | ICD-10-CM | POA: Diagnosis not present

## 2019-05-17 DIAGNOSIS — M47816 Spondylosis without myelopathy or radiculopathy, lumbar region: Secondary | ICD-10-CM | POA: Diagnosis not present

## 2019-05-17 DIAGNOSIS — M5416 Radiculopathy, lumbar region: Secondary | ICD-10-CM | POA: Diagnosis not present

## 2019-05-17 DIAGNOSIS — M5136 Other intervertebral disc degeneration, lumbar region: Secondary | ICD-10-CM | POA: Diagnosis not present

## 2019-05-29 DIAGNOSIS — H5203 Hypermetropia, bilateral: Secondary | ICD-10-CM | POA: Diagnosis not present

## 2019-05-29 DIAGNOSIS — H52209 Unspecified astigmatism, unspecified eye: Secondary | ICD-10-CM | POA: Diagnosis not present

## 2019-05-29 DIAGNOSIS — H524 Presbyopia: Secondary | ICD-10-CM | POA: Diagnosis not present

## 2019-06-01 DIAGNOSIS — M47816 Spondylosis without myelopathy or radiculopathy, lumbar region: Secondary | ICD-10-CM | POA: Diagnosis not present

## 2019-06-14 ENCOUNTER — Ambulatory Visit: Payer: Medicare HMO | Attending: Gastroenterology

## 2019-06-22 DIAGNOSIS — M5136 Other intervertebral disc degeneration, lumbar region: Secondary | ICD-10-CM | POA: Diagnosis not present

## 2019-06-22 DIAGNOSIS — M47816 Spondylosis without myelopathy or radiculopathy, lumbar region: Secondary | ICD-10-CM | POA: Diagnosis not present

## 2019-06-22 DIAGNOSIS — M5416 Radiculopathy, lumbar region: Secondary | ICD-10-CM | POA: Diagnosis not present

## 2019-06-22 DIAGNOSIS — M48062 Spinal stenosis, lumbar region with neurogenic claudication: Secondary | ICD-10-CM | POA: Diagnosis not present

## 2019-06-29 DIAGNOSIS — N1831 Chronic kidney disease, stage 3a: Secondary | ICD-10-CM | POA: Diagnosis not present

## 2019-06-29 DIAGNOSIS — I129 Hypertensive chronic kidney disease with stage 1 through stage 4 chronic kidney disease, or unspecified chronic kidney disease: Secondary | ICD-10-CM | POA: Diagnosis not present

## 2019-06-29 DIAGNOSIS — F325 Major depressive disorder, single episode, in full remission: Secondary | ICD-10-CM | POA: Diagnosis not present

## 2019-07-01 DIAGNOSIS — G8929 Other chronic pain: Secondary | ICD-10-CM | POA: Diagnosis not present

## 2019-07-01 DIAGNOSIS — M545 Low back pain: Secondary | ICD-10-CM | POA: Diagnosis not present

## 2019-07-01 DIAGNOSIS — M6281 Muscle weakness (generalized): Secondary | ICD-10-CM | POA: Diagnosis not present

## 2019-07-05 DIAGNOSIS — M6281 Muscle weakness (generalized): Secondary | ICD-10-CM | POA: Diagnosis not present

## 2019-07-05 DIAGNOSIS — M545 Low back pain: Secondary | ICD-10-CM | POA: Diagnosis not present

## 2019-07-05 DIAGNOSIS — G8929 Other chronic pain: Secondary | ICD-10-CM | POA: Diagnosis not present

## 2019-07-07 DIAGNOSIS — M545 Low back pain: Secondary | ICD-10-CM | POA: Diagnosis not present

## 2019-07-07 DIAGNOSIS — M6281 Muscle weakness (generalized): Secondary | ICD-10-CM | POA: Diagnosis not present

## 2019-07-07 DIAGNOSIS — G8929 Other chronic pain: Secondary | ICD-10-CM | POA: Diagnosis not present

## 2019-07-14 DIAGNOSIS — M545 Low back pain: Secondary | ICD-10-CM | POA: Diagnosis not present

## 2019-07-14 DIAGNOSIS — G8929 Other chronic pain: Secondary | ICD-10-CM | POA: Diagnosis not present

## 2019-07-14 DIAGNOSIS — M6281 Muscle weakness (generalized): Secondary | ICD-10-CM | POA: Diagnosis not present

## 2019-07-15 DIAGNOSIS — M6281 Muscle weakness (generalized): Secondary | ICD-10-CM | POA: Diagnosis not present

## 2019-07-15 DIAGNOSIS — M545 Low back pain: Secondary | ICD-10-CM | POA: Diagnosis not present

## 2019-07-15 DIAGNOSIS — G8929 Other chronic pain: Secondary | ICD-10-CM | POA: Diagnosis not present

## 2019-07-20 DIAGNOSIS — M6281 Muscle weakness (generalized): Secondary | ICD-10-CM | POA: Diagnosis not present

## 2019-07-20 DIAGNOSIS — M545 Low back pain: Secondary | ICD-10-CM | POA: Diagnosis not present

## 2019-07-20 DIAGNOSIS — G8929 Other chronic pain: Secondary | ICD-10-CM | POA: Diagnosis not present

## 2019-07-22 DIAGNOSIS — M160 Bilateral primary osteoarthritis of hip: Secondary | ICD-10-CM | POA: Diagnosis not present

## 2019-07-22 DIAGNOSIS — Z6841 Body Mass Index (BMI) 40.0 and over, adult: Secondary | ICD-10-CM | POA: Diagnosis not present

## 2019-07-22 DIAGNOSIS — M25552 Pain in left hip: Secondary | ICD-10-CM | POA: Diagnosis not present

## 2019-07-22 DIAGNOSIS — M25551 Pain in right hip: Secondary | ICD-10-CM | POA: Diagnosis not present

## 2019-07-26 ENCOUNTER — Ambulatory Visit
Admission: RE | Admit: 2019-07-26 | Discharge: 2019-07-26 | Disposition: A | Discharge status: Home / Self Care | Payer: Medicare HMO | Source: Ambulatory Visit | Attending: Gastroenterology | Admitting: Gastroenterology

## 2019-07-26 ENCOUNTER — Other Ambulatory Visit: Payer: Self-pay

## 2019-07-26 DIAGNOSIS — K224 Dyskinesia of esophagus: Secondary | ICD-10-CM | POA: Insufficient documentation

## 2019-07-26 DIAGNOSIS — K219 Gastro-esophageal reflux disease without esophagitis: Secondary | ICD-10-CM | POA: Diagnosis not present

## 2019-07-26 DIAGNOSIS — R1310 Dysphagia, unspecified: Secondary | ICD-10-CM | POA: Insufficient documentation

## 2019-07-26 NOTE — Therapy (Signed)
[place], [place], [address] Phone: [phone]   Fax:     Modified Barium Swallow  Patient Details  Name: Jamie Phillips MRN: [id number] Date of Birth: 12-03-1940 No data recorded  Encounter Date: 07/26/2019  End of Session - 07/26/19 1355    Visit Number  1    Number of Visits  1    Date for SLP Re-Evaluation  07/26/19       Past Medical History:  Diagnosis Date  . Anginal pain ([place])    atypical chest pain  . Anxiety   . Arthritis   . Back pain   . Cancer ([place])    cll  . Chronic kidney disease   . Chronic kidney disease   . CLL (chronic lymphocytic leukemia) ([place])   . Depression   . Epigastric pain   . GERD (gastroesophageal reflux disease)   . Headache   . Hemorrhoids   . Hypercholesteremia   . Hyperlipidemia   . Hypertension   . 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 Macular degeneration   . MGUS (monoclonal gammopathy of unknown significance)   . Morbid obesity ([place])   . Obesity   . Pulmonary embolism ([place])   . PVD (peripheral vascular disease) ([place])   . Sleep apnea     Past Surgical History:  Procedure Laterality Date  . APPENDECTOMY    . BREAST BIOPSY Left 08/19/04   lt bx/clip-neg  . COLONOSCOPY    . COLONOSCOPY WITH PROPOFOL N/A 03/10/2017   Procedure: COLONOSCOPY WITH PROPOFOL;  Surgeon: [name], MD;  [facility] ENDOSCOPY;  Service: Endoscopy;  Laterality: N/A;  . ESOPHAGOGASTRODUODENOSCOPY (EGD) WITH PROPOFOL N/A 02/02/2019   Procedure: ESOPHAGOGASTRODUODENOSCOPY (EGD) WITH PROPOFOL;  Surgeon: Toledo, Benay Pike, MD;  Location: [facility] ENDOSCOPY;  Service: Endoscopy;  Laterality: N/A;  . IVC FILTER INSERTION N/A 06/11/2016   Procedure: IVC Filter Insertion;  Surgeon: Katha Cabal, MD;  Location: [place] CV LAB;  Service: Cardiovascular;  Laterality: N/A;  . IVC FILTER REMOVAL N/A 05/06/2017   Procedure: IVC FILTER REMOVAL;   Surgeon: Katha Cabal, MD;  Location: [place] CV LAB;  Service: Cardiovascular;  Laterality: N/A;  . JOINT REPLACEMENT Left 06/17/2016  . LAPAROSCOPIC APPENDECTOMY N/A 10/10/2018   Procedure: APPENDECTOMY LAPAROSCOPIC ATTEMPTED CONVERTED TO OPEN;  Surgeon: Herbert Pun, MD;  Location: [facility] ORS;  Service: General;  Laterality: N/A;  . LAPAROSCOPY N/A 10/10/2018   Procedure: LAPAROSCOPY DIAGNOSTIC ATTEMPTED CONVERTED TO OPEN;  Surgeon: Herbert Pun, MD;  Location: [facility] ORS;  Service: General;  Laterality: N/A;  . neck fusion    . PARTIAL COLECTOMY Right 10/10/2018   Procedure: PARTIAL COLECTOMY;  Surgeon: Herbert Pun, MD;  Location: [facility] ORS;  Service: General;  Laterality: Right;  . PERIPHERAL VASCULAR CATHETERIZATION N/A 08/15/2015   Procedure: pulmonary angiogram with lysis;  Surgeon: Katha Cabal, MD;  Location: [place] CV LAB;  Service: Cardiovascular;  Laterality: N/A;  . WRIST SURGERY Left 2000    There were no vitals filed for this visit. Subjective: Patient behavior: (alertness, ability to follow instructions, etc.):  The patient is alert, able to verbalize her swallowing complaints, and follow directions.  Chief complaint: esophageal dysmotility per barium swallow study 01/2019   Objective:  Radiological Procedure: A videoflouroscopic evaluation of oral-preparatory, reflex initiation, and pharyngeal phases of the swallow was performed; as well as a screening of the upper esophageal phase.  I. POSTURE: Upright in  MBS chair  II. VIEW: Lateral  III. COMPENSATORY STRATEGIES: N/A  IV. BOLUSES ADMINISTERED:   Thin Liquid: 1 small, 4 rapid consecutive   Nectar-thick Liquid: 1 moderate   Honey-thick Liquid: DNT   Puree: 1 teaspoon, 1 heaping teaspoon   Mechanical Soft: 1/4 graham cracker in applesauce  V. RESULTS OF EVALUATION: A. ORAL PREPARATORY PHASE: (The lips, tongue, and velum are observed for strength and coordination)        **Overall Severity Rating: within normal limits  B. SWALLOW INITIATION/REFLEX: (The reflex is normal if "triggered" by the time the bolus reached the base of the tongue)  **Overall Severity Rating: within normal limits  C. PHARYNGEAL PHASE: (Pharyngeal function is normal if the bolus shows rapid, smooth, and continuous transit through the pharynx and there is no pharyngeal residue after the swallow)  **Overall Severity Rating: within normal limits  D. LARYNGEAL PENETRATION: (Material entering into the laryngeal inlet/vestibule but not aspirated) None  E. ASPIRATION: None  F. 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  Oral control of the bolus including oral hold, rotary mastication, and anterior to posterior transfer is within functional limits.   Timing of pharyngeal swallow initiation is within functional limits. Aspects of the pharyngeal stage of swallowing including tongue base retraction, hyolaryngeal excursion, epiglottic inversion, and duration/amplitude of UES opening are within normal limits.  There is no observed pharyngeal residue, laryngeal penetration, or tracheal aspiration.  The patient is not at risk for prandial aspiration.  The patient was assured that her swallowing is safe, from an aspiration perspective.   PLAN/RECOMMENDATIONS:   A. Diet: Regular   B. Swallowing Precautions: No special precautions indicated for oropharyngeal swallowing   C. Recommended consultation to: follow up with MDs as recommended   D. Therapy recommendations: speech therapy is not indicated   E. Results and recommendations were discussed with the patient immediately following the study and the final report routed to the referring practitioner.     Dysphagia,  unspecified type - Plan: DG SWALLOW FUNC OP MEDICARE SPEECH PATH, DG SWALLOW FUNC OP MEDICARE SPEECH PATH  Esophageal dysmotility - Plan: DG SWALLOW FUNC OP MEDICARE SPEECH PATH, DG SWALLOW FUNC OP MEDICARE SPEECH PATH        Problem List Patient Active Problem List   Diagnosis Date Noted  . Other fatigue 03/07/2019  . Monoclonal gammopathy 03/04/2019  . COVID-19 12/14/2018  . Right lower quadrant pain 10/09/2018  . Calculus of gallbladder without cholecystitis without obstruction 08/27/2016  . 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 History of DVT in adulthood 03/04/2016  . DJD (degenerative joint disease) 03/04/2016  . Pain in limb 03/04/2016  . Venous insufficiency 03/04/2016  . CLL (chronic lymphocytic leukemia) ([place]) 11/21/2015  . Acute respiratory failure ([place]) 08/14/2015  . Acute bronchitis 05/11/2015  . Essential hypertension 05/04/2015  . Hyperlipidemia 05/04/2015  . Pain in the chest 05/04/2015  . SOB (shortness of breath) 05/04/2015   Leroy Sea, MS/CCC- SLP  Lou Miner 07/26/2019, 2:01 PM  [facility] [place], [place], [address] Phone: [phone]   Fax:     Name: Jamie Phillips MRN: [id number] Date of Birth: 01/27/1941

## 2019-07-27 DIAGNOSIS — M6281 Muscle weakness (generalized): Secondary | ICD-10-CM | POA: Diagnosis not present

## 2019-07-27 DIAGNOSIS — G8929 Other chronic pain: Secondary | ICD-10-CM | POA: Diagnosis not present

## 2019-07-27 DIAGNOSIS — M545 Low back pain: Secondary | ICD-10-CM | POA: Diagnosis not present

## 2019-07-29 DIAGNOSIS — M545 Low back pain: Secondary | ICD-10-CM | POA: Diagnosis not present

## 2019-07-29 DIAGNOSIS — M6281 Muscle weakness (generalized): Secondary | ICD-10-CM | POA: Diagnosis not present

## 2019-07-29 DIAGNOSIS — G8929 Other chronic pain: Secondary | ICD-10-CM | POA: Diagnosis not present

## 2019-08-03 DIAGNOSIS — G8929 Other chronic pain: Secondary | ICD-10-CM | POA: Diagnosis not present

## 2019-08-03 DIAGNOSIS — M545 Low back pain: Secondary | ICD-10-CM | POA: Diagnosis not present

## 2019-08-03 DIAGNOSIS — M6281 Muscle weakness (generalized): Secondary | ICD-10-CM | POA: Diagnosis not present

## 2019-08-05 DIAGNOSIS — M47816 Spondylosis without myelopathy or radiculopathy, lumbar region: Secondary | ICD-10-CM | POA: Diagnosis not present

## 2019-08-10 DIAGNOSIS — G8929 Other chronic pain: Secondary | ICD-10-CM | POA: Diagnosis not present

## 2019-08-10 DIAGNOSIS — M545 Low back pain: Secondary | ICD-10-CM | POA: Diagnosis not present

## 2019-08-10 DIAGNOSIS — M6281 Muscle weakness (generalized): Secondary | ICD-10-CM | POA: Diagnosis not present

## 2019-08-12 DIAGNOSIS — G8929 Other chronic pain: Secondary | ICD-10-CM | POA: Diagnosis not present

## 2019-08-12 DIAGNOSIS — M6281 Muscle weakness (generalized): Secondary | ICD-10-CM | POA: Diagnosis not present

## 2019-08-12 DIAGNOSIS — M545 Low back pain: Secondary | ICD-10-CM | POA: Diagnosis not present

## 2019-08-17 DIAGNOSIS — M48061 Spinal stenosis, lumbar region without neurogenic claudication: Secondary | ICD-10-CM | POA: Diagnosis not present

## 2019-08-17 DIAGNOSIS — M25551 Pain in right hip: Secondary | ICD-10-CM | POA: Diagnosis not present

## 2019-08-17 DIAGNOSIS — M160 Bilateral primary osteoarthritis of hip: Secondary | ICD-10-CM | POA: Diagnosis not present

## 2019-08-17 DIAGNOSIS — M545 Low back pain: Secondary | ICD-10-CM | POA: Diagnosis not present

## 2019-08-17 DIAGNOSIS — M5136 Other intervertebral disc degeneration, lumbar region: Secondary | ICD-10-CM | POA: Diagnosis not present

## 2019-08-17 DIAGNOSIS — G8929 Other chronic pain: Secondary | ICD-10-CM | POA: Diagnosis not present

## 2019-08-17 DIAGNOSIS — M25552 Pain in left hip: Secondary | ICD-10-CM | POA: Diagnosis not present

## 2019-08-17 DIAGNOSIS — Z6841 Body Mass Index (BMI) 40.0 and over, adult: Secondary | ICD-10-CM | POA: Diagnosis not present

## 2019-08-24 DIAGNOSIS — M1612 Unilateral primary osteoarthritis, left hip: Secondary | ICD-10-CM | POA: Diagnosis not present

## 2019-08-24 DIAGNOSIS — M25552 Pain in left hip: Secondary | ICD-10-CM | POA: Diagnosis not present

## 2019-08-24 DIAGNOSIS — G8929 Other chronic pain: Secondary | ICD-10-CM | POA: Diagnosis not present

## 2019-09-02 ENCOUNTER — Inpatient Hospital Stay: Payer: Medicare HMO | Admitting: Hematology and Oncology

## 2019-09-02 ENCOUNTER — Inpatient Hospital Stay: Payer: Medicare HMO

## 2019-09-02 ENCOUNTER — Other Ambulatory Visit: Payer: Self-pay | Admitting: Hematology and Oncology

## 2019-09-02 DIAGNOSIS — D472 Monoclonal gammopathy: Secondary | ICD-10-CM

## 2019-09-02 NOTE — Progress Notes (Signed)
[facility]  [address], [address] [place], [place] [address] Phone: [phone]  Fax: [phone]   Clinic Day:  09/03/2019  Referring physician: Kirk Ruths, MD  Chief Complaint: Jamie Phillips is a 79 y.o. 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   HPI: The patient was last seen in the medical oncology clinic on 03/05/2019. At that time, she was fatigued. Exam revealed an apparent large right hip skin tag. Hematocrit was 38.8, hemoglobin 12.2, platelets 221,000, WBC 13,500 (ANC 3,200; ALC 8,700). LDH was 180. M spike was 0 gm/dL. She was on Eliquis. Surveillance continued.   She had a follow up with Stephens November, NP in GI on 04/26/2019. She noted issues with swallowing. Solids and liquids were getting hung most days in the mid to lower neck. She had not had any airway obstruction. Her reflux was well controlled with pantoprazole 40 mg/day. She was recommended to try a couple altoid peppermints prior to eating to help relax her esophagus.   The patient had a bilateral L3-4 and L4-5 zygapophysial joint injection with Dr. Sharlet Salina on 06/01/2019.   Barium swallow was unremarkable on 07/26/2019.  Speech therapy was not indicated.   During the interim, she has been experiencing some back pain. She notes she had an MRI to assess her back pain around 2-3 weeks ago; records are inaccessible. Besides her back, she has been feeling good. She denies any infections, fever, sweats, and weight loss. She still experiences sleep apnea and suspects she needs a new CPAP machine. She still experiences diarrhea since her colonoscopy in June of 2020. She denies any solid bowel movements. She is still taking Eliquis.   She has received both doses of the COVID-19 vaccine.    Past Medical History:  Diagnosis Date  . Anginal pain ([place])    atypical chest pain  . Anxiety   . Arthritis   .  Back pain   . Cancer ([place])    cll  . Chronic kidney disease   . Chronic kidney disease   . CLL (chronic lymphocytic leukemia) ([place])   . Depression   . Epigastric pain   . GERD (gastroesophageal reflux disease)   . Headache   . Hemorrhoids   . Hypercholesteremia   . Hyperlipidemia   . Hypertension   . 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 Macular degeneration   . MGUS (monoclonal gammopathy of unknown significance)   . Morbid obesity ([place])   . Obesity   . Pulmonary embolism ([place])   . PVD (peripheral vascular disease) ([place])   . Sleep apnea     Past Surgical History:  Procedure Laterality Date  . APPENDECTOMY    . BREAST BIOPSY Left 08/19/04   lt bx/clip-neg  . COLONOSCOPY    . COLONOSCOPY WITH PROPOFOL N/A 03/10/2017   Procedure: COLONOSCOPY WITH PROPOFOL;  Surgeon: [name], MD;  Location: [facility] ENDOSCOPY;  Service: Endoscopy;  Laterality: N/A;  . ESOPHAGOGASTRODUODENOSCOPY (EGD) WITH PROPOFOL N/A 02/02/2019   Procedure: ESOPHAGOGASTRODUODENOSCOPY (EGD) WITH PROPOFOL;  Surgeon: Toledo, Benay Pike, MD;  Location: [facility] ENDOSCOPY;  Service: Endoscopy;  Laterality: N/A;  . IVC FILTER INSERTION N/A 06/11/2016   Procedure: IVC Filter Insertion;  Surgeon: Katha Cabal, MD;  Location: [place] CV LAB;  Service: Cardiovascular;  Laterality: N/A;  . IVC FILTER REMOVAL N/A 05/06/2017   Procedure: IVC FILTER REMOVAL;  Surgeon: Katha Cabal,  MD;  Location: [place] CV LAB;  Service: Cardiovascular;  Laterality: N/A;  . JOINT REPLACEMENT Left 06/17/2016  . LAPAROSCOPIC APPENDECTOMY N/A 10/10/2018   Procedure: APPENDECTOMY LAPAROSCOPIC ATTEMPTED CONVERTED TO OPEN;  Surgeon: Herbert Pun, MD;  Location: [facility] ORS;  Service: General;  Laterality: N/A;  . LAPAROSCOPY N/A 10/10/2018   Procedure: LAPAROSCOPY DIAGNOSTIC ATTEMPTED CONVERTED TO OPEN;  Surgeon: Herbert Pun, MD;  Location: [facility] ORS;  Service: General;  Laterality: N/A;  .  neck fusion    . PARTIAL COLECTOMY Right 10/10/2018   Procedure: PARTIAL COLECTOMY;  Surgeon: Herbert Pun, MD;  Location: [facility] ORS;  Service: General;  Laterality: Right;  . PERIPHERAL VASCULAR CATHETERIZATION N/A 08/15/2015   Procedure: pulmonary angiogram with lysis;  Surgeon: Katha Cabal, MD;  Location: [place] CV LAB;  Service: Cardiovascular;  Laterality: N/A;  . WRIST SURGERY Left 2000    Family History  Problem Relation Age of Onset  . Heart failure Father   . Congestive Heart Failure Father   . Pulmonary embolism Mother   . Breast cancer Neg Hx     Social History:  reports that she has never smoked. She has never used smokeless tobacco. She reports that she does not drink alcohol or use drugs. She does not smoke or drink alcohol. 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  She denies any known exposure to radiation or toxins. She takes care of her daughter who has had 2 strokes. She lives with her son in [place], [place]. The patient is alone today.  Allergies:  Allergies  Allergen Reactions  . Oxycodone Other (See Comments)    Reaction:  Hallucinations  . Morphine Other (See Comments)    Reaction:  Hallucinations    Current Medications: Current Outpatient Medications  Medication Sig Dispense Refill  . acetaminophen (TYLENOL) 650 MG CR tablet Take 650-1,300 mg by mouth every 8 (eight) hours as needed for pain. TYLENOL ARTHRITIS    . albuterol (PROVENTIL HFA;VENTOLIN HFA) 108 (90 Base) MCG/ACT inhaler Inhale 2 puffs into the lungs every 6 (six) hours as needed for wheezing or shortness of breath.    . celecoxib (CELEBREX) 100 MG capsule     . ELIQUIS 5 MG TABS tablet TAKE 1 TABLET TWICE DAILY (Patient taking differently: Take 5 mg by mouth 2 (two) times daily. ) 180 tablet 6  . escitalopram (LEXAPRO) 10 MG tablet Take 10 mg by mouth daily.    Marland Kitchen gabapentin (NEURONTIN) 300 MG capsule Take 300 mg by mouth 2  (two) times daily.     Marland Kitchen losartan-hydrochlorothiazide (HYZAAR) 100-12.5 MG tablet Take 1 tablet by mouth daily. Reported on 08/22/2015    . Multiple Vitamins-Minerals (PRESERVISION AREDS 2 PO) Take 1 capsule by mouth 2 (two) times daily.     . pantoprazole (PROTONIX) 40 MG tablet Take 1 tablet (40 mg total) by mouth 2 (two) times daily for 14 days. (Patient taking differently: Take 40 mg by mouth daily. ) 28 tablet 0  . pravastatin (PRAVACHOL) 40 MG tablet Take 40 mg by mouth daily.     . propranolol (INDERAL) 40 MG tablet Take 40 mg by mouth 2 (two) times a day.    . sucralfate (CARAFATE) 1 g tablet Take 1 g by mouth 4 (four) times daily -  with meals and at bedtime.    Marland Kitchen tiZANidine (ZANAFLEX) 4 MG tablet Take 4 mg by mouth at bedtime as needed for muscle spasms.     Marland Kitchen  traMADol (ULTRAM) 50 MG tablet Take by mouth every 6 (six) hours as needed.    Marland Kitchen dexamethasone (DECADRON) 6 MG tablet Take 1 tablet (6 mg total) by mouth 2 (two) times daily with a meal. For 5 days (Patient not taking: Reported on 03/04/2019) 5 tablet 0  . loperamide (IMODIUM) 2 MG capsule Take 2 mg by mouth as needed for diarrhea or loose stools.     No current facility-administered medications for this visit.    Review of Systems  Constitutional: Positive for malaise/fatigue (chronic). Negative for chills, diaphoresis, fever and weight loss.  HENT: Negative.  Negative for congestion, ear pain, hearing loss, nosebleeds, sinus pain, sore throat and tinnitus.   Eyes: Negative.  Negative for blurred vision, double vision and pain.  Respiratory: Negative for cough, hemoptysis, sputum production and shortness of breath.        Sleep apnea.  Cardiovascular: Negative.  Negative for chest pain, palpitations, orthopnea and claudication.  Gastrointestinal: Positive for diarrhea (improved). Negative for abdominal pain, blood in stool, constipation, heartburn, melena, nausea and vomiting.  Genitourinary: Negative.  Negative for dysuria,  frequency, hematuria and urgency.  Musculoskeletal: Positive for back pain (chronic) and joint pain. Negative for myalgias.  Skin: Negative.  Negative for itching and rash.  Neurological: Positive for weakness (generalized). Negative for dizziness, sensory change, speech change, focal weakness, seizures and headaches.  Endo/Heme/Allergies: Bruises/bleeds easily (chronic).  Psychiatric/Behavioral: Negative.  Negative for depression and memory loss.  All other systems reviewed and are negative.  Performance status (ECOG): 1  Vitals Blood pressure 117/62, pulse 60, temperature (!) 96.1 F (35.6 C), temperature source Tympanic, resp. rate 16, weight 273 lb (123.8 kg), SpO2 97 %.   Physical Exam  Constitutional: She is oriented to person, place, and time and well-developed, well-nourished, and in no distress. No distress.  HENT:  Head: Normocephalic and atraumatic.  Mouth/Throat: Oropharynx is clear and moist. No oropharyngeal exudate.  Short curly gray hair.  Mask.  Eyes: Pupils are equal, round, and reactive to light. Conjunctivae and EOM are normal. Right eye exhibits no discharge. Left eye exhibits no discharge. No scleral icterus.  Glasses.  Blue eyes.  Neck: No JVD present.  Cardiovascular: Normal rate, regular rhythm and normal heart sounds. Exam reveals no gallop and no friction rub.  No murmur heard. Pulmonary/Chest: Effort normal and breath sounds normal. No respiratory distress. She has no wheezes. She has no rales. She exhibits no tenderness.  Abdominal: Soft. Bowel sounds are normal. She exhibits no distension and no mass. There is no abdominal tenderness. There is no rebound and no guarding.  Musculoskeletal:        General: No tenderness or edema. Normal range of motion.     Cervical back: Normal range of motion and neck supple.     Lumbar back: Pain present.     Comments: She experienced right upper thigh discomfort when she laid back on the exam table.   Lymphadenopathy:         Head (right side): No preauricular, no posterior auricular and no occipital adenopathy present.       Head (left side): No preauricular, no posterior auricular and no occipital adenopathy present.    She has no cervical adenopathy.       Right cervical: No superficial cervical adenopathy present.      Left cervical: No superficial cervical adenopathy present.    She has no axillary adenopathy.       Right axillary: No pectoral adenopathy present.  Left axillary: No pectoral adenopathy present.      Right: No inguinal and no supraclavicular adenopathy present.       Left: No inguinal and no supraclavicular adenopathy present.  Neurological: She is alert and oriented to person, place, and time.  Skin: Skin is warm and dry. No rash noted. She is not diaphoretic. No erythema. No pallor.  Psychiatric: Mood, memory, affect and judgment normal.  Nursing note and vitals reviewed.    No visits with results within 3 Day(s) from this visit.  Latest known visit with results is:  Admission on 02/02/2019, Discharged on 02/02/2019  Component Date Value Ref Range Status  . SURGICAL PATHOLOGY 02/02/2019    Final-Edited                   Value:SURGICAL PATHOLOGY CASE: [id number] PATIENT: Jamie Phillips Surgical Pathology Report  Specimen Submitted: A. Stomach, antrum; cbx  Clinical History: Upper abdominal pain, GERD, dysphagia; Linear gastritis, tertiary peristaltic waves noted.   DIAGNOSIS: A.  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 - NEGATIVE FOR H. PYLORI, DYSPLASIA, AND MALIGNANCY.  GROSS DESCRIPTION: A. Labeled: C BX of stomach Received: In formalin Tissue fragment(s): 3 Size: From 0.2-0.3 cm Description: Tan soft tissue fragments Entirely submitted in 1 cassette.  Final Diagnosis performed by Quay Burow, MD.    Electronically signed 02/03/2019 2:08:43PM The electronic signature indicates that the named Attending Pathologist has evaluated the specimen Technical component performed at [facility], [address], [place], [place] [address] Lab: [phone] Dir: Rush Farmer, MD, MMM  Professional component performed at [facility], [facility], [place], [place], [place] [address] Lab: [phone] Dir: Dellia Nims. Reuel Derby, MD    Assessment:  Jamie Phillips is a 79 y.o. 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  WBC has ranged between 15,700 - 31,600 without trend since 08/15/2015.  She was diagnosed with bilateral massive pulmonary emboli in 08/14/2015.  Chest CT angiogram on 08/14/2015 revealed anacute and subacute pulmonary emboli with evidence of right heart strain (RV/LV Ratio = 1.2) consistent with at least submassive (intermediate risk) PE.  Bilateral lower extremity duplex on 08/15/2015 revealed a right posterior tibial DVT.  She underwent pulmonary angiogram with lysis.  Factor V Leiden and prothrombin gene mutation were negative on 11/14/2015.  She is on chronic Eliquis.  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    M-spike has been followed (gm/dL):  0.1 on 07/16/2012, 0 on 07/28/2014, 0 on 01/26/2015, 0 on 08/01/2015, 0 on 11/14/2015, 0.3 on 07/30/2016, 0.4 on 07/29/2017, 0 on 08/31/2018, and 0 on 03/05/2019.  Kappa free light chains have been followed:  32.73 (ratio 4.41) on 08/01/2015, 26.2 (ratio 4.44) on 07/30/2016, 36.7 (ratio 4.96) on 07/29/2017, and 36.7 (ratio 5.24) on 08/31/2018.  She has epigastric pain and dysphagia.  Esophogram, barium swallow, barium tablet study on  01/22/2019 revealed moderate esophageal dysmotility with intermittent esophageal spasm resulting in corkscrew appearance of the distal esophagus. There was a small hiatal hernia. There were no persisting strictures.  EGD on 02/02/2019 revealed abnormal  esophageal motility, suspicious for esophageal spasm. There was erythematous mucosa in the antrum. The duodenum was normal.   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 There was no H. Pylori, dysplasia, or malignancy.   She was [facility] to [facility] from 10/09/2018 - 10/17/2018 with perforated diverticulitis.  She underwent laparoscopic (hand assisted) right hemicolectomy.   She was admitted to [facility] on 12/14/2018 - 12/20/2018 with COVID-19. She was hypoxic. Chest CT on 10/16/2018 showed multiple ill-defined airspace opacities throughout both lungs peripherally most c/w multi-focal pneumonia.  She was treated with 5 days of Remdesivir.  She required 2 L/min nasal cannula.  She received steroids.  She has received both doses of the COVID-19 vaccine.   Symptomatically, he feels good except for some back pain.  He has had an MRI (no results available).  Plan: 1.   Labs today: CBC with diff, CMP, SPEP, FLCA, LDH, TSH. 2.   Chronic lymphocytic leukemia (CLL)             Clinically, she is doing well.             She has stage 0 CLL             Hematocrit 40.2.  Hemoglobin 12.7.  MCV 95.0.  Platelets 226,000.  WBC 13,700 (ANC 8200; ALC 8400).  Continue surveillance. 3.   Monoclonal gammopathy                       IgA monoclonal gammopathy of unclear significance.             Creatinine 1.11.  Calcium 8.6.  Albumen 2.5.  SPEP revealed no monoclonal protein.  Continue surveillance every 6 months. 4.   Pulmonary embolism and RLE DVT             Patient had a massive pulmonary embolism  requiring thrombolysis.             She remains on indefinite anticoagulation.             Continue Eliquis. 5.  Chronic fatigue             Etiology remains unclear.  She has sleep apnea.  She suspects she needs a new CPAP machine. 6.   RN to call patient with results. 7.   Patient to follow-up with dermatology. 8.   RTC in 6 months for MD assessment and labs (CBC with diff, CMP, SPEP, LDH).  I discussed the assessment and treatment plan with the patient.  The patient was provided an opportunity to ask questions and all were answered.  The patient agreed with the plan and demonstrated an understanding of the instructions.  The patient was advised to call back if the symptoms worsen or if the condition fails to improve as anticipated.  I provided 20 minutes (9:20 AM - 9:40 AM) of face-to-face time during this this encounter and > 50% was spent counseling as documented under my assessment and plan.    Lequita Asal, MD, PhD    09/03/2019, 9:20 AM  I, Heywood Footman, am acting as a Education administrator for Lequita Asal, MD.  I, [place] Mike Gip, MD, have reviewed the above documentation for accuracy and completeness, and I agree with the above.

## 2019-09-03 ENCOUNTER — Inpatient Hospital Stay (HOSPITAL_BASED_OUTPATIENT_CLINIC_OR_DEPARTMENT_OTHER): Payer: Medicare HMO | Admitting: Hematology and Oncology

## 2019-09-03 ENCOUNTER — Encounter: Payer: Self-pay | Admitting: Hematology and Oncology

## 2019-09-03 ENCOUNTER — Inpatient Hospital Stay: Payer: Medicare HMO | Attending: Hematology and Oncology

## 2019-09-03 ENCOUNTER — Other Ambulatory Visit: Payer: Self-pay

## 2019-09-03 VITALS — BP 117/62 | HR 60 | Temp 96.1°F | Resp 16 | Wt 273.0 lb

## 2019-09-03 DIAGNOSIS — D472 Monoclonal gammopathy: Secondary | ICD-10-CM

## 2019-09-03 DIAGNOSIS — C9190 Lymphoid leukemia, unspecified not having achieved remission: Secondary | ICD-10-CM | POA: Diagnosis not present

## 2019-09-03 DIAGNOSIS — R5382 Chronic fatigue, unspecified: Secondary | ICD-10-CM | POA: Insufficient documentation

## 2019-09-03 DIAGNOSIS — R5383 Other fatigue: Secondary | ICD-10-CM

## 2019-09-03 DIAGNOSIS — C9110 Chronic lymphocytic leukemia of B-cell type not having achieved remission: Secondary | ICD-10-CM

## 2019-09-03 DIAGNOSIS — I2782 Chronic pulmonary embolism: Secondary | ICD-10-CM | POA: Diagnosis not present

## 2019-09-03 LAB — COMPREHENSIVE METABOLIC PANEL
ALT: 16 U/L (ref 0–44)
AST: 17 U/L (ref 15–41)
Albumin: 3.5 g/dL (ref 3.5–5.0)
Alkaline Phosphatase: 96 U/L (ref 38–126)
Anion gap: 11 (ref 5–15)
BUN: 21 mg/dL (ref 8–23)
CO2: 26 mmol/L (ref 22–32)
Calcium: 8.6 mg/dL — ABNORMAL LOW (ref 8.9–10.3)
Chloride: 101 mmol/L (ref 98–111)
Creatinine, Ser: 1.11 mg/dL — ABNORMAL HIGH (ref 0.44–1.00)
GFR calc Af Amer: 55 mL/min — ABNORMAL LOW (ref >60)
GFR calc non Af Amer: 48 mL/min — ABNORMAL LOW (ref >60)
Glucose, Bld: 131 mg/dL — ABNORMAL HIGH (ref 70–99)
Potassium: 3.7 mmol/L (ref 3.5–5.1)
Sodium: 138 mmol/L (ref 135–145)
Total Bilirubin: 0.5 mg/dL (ref 0.3–1.2)
Total Protein: 6.2 g/dL — ABNORMAL LOW (ref 6.5–8.1)

## 2019-09-03 LAB — CBC WITH DIFFERENTIAL/PLATELET
Abs Immature Granulocytes: 0.02 10*3/uL (ref 0.00–0.07)
Basophils Absolute: 0.1 10*3/uL (ref 0.0–0.1)
Basophils Relative: 1 %
Eosinophils Absolute: 0.3 10*3/uL (ref 0.0–0.5)
Eosinophils Relative: 2 %
HCT: 40.2 % (ref 36.0–46.0)
Hemoglobin: 12.7 g/dL (ref 12.0–15.0)
Immature Granulocytes: 0 %
Lymphocytes Relative: 61 %
Lymphs Abs: 8.4 10*3/uL — ABNORMAL HIGH (ref 0.7–4.0)
MCH: 30 pg (ref 26.0–34.0)
MCHC: 31.6 g/dL (ref 30.0–36.0)
MCV: 95 fL (ref 80.0–100.0)
Monocytes Absolute: 0.8 10*3/uL (ref 0.1–1.0)
Monocytes Relative: 6 %
Neutro Abs: 4.2 10*3/uL (ref 1.7–7.7)
Neutrophils Relative %: 30 %
Platelets: 226 10*3/uL (ref 150–400)
RBC: 4.23 MIL/uL (ref 3.87–5.11)
RDW: 15 % (ref 11.5–15.5)
WBC: 13.7 10*3/uL — ABNORMAL HIGH (ref 4.0–10.5)
nRBC: 0 % (ref 0.0–0.2)

## 2019-09-03 LAB — LACTATE DEHYDROGENASE: LDH: 168 U/L (ref 98–192)

## 2019-09-03 LAB — TSH: TSH: 1.304 u[IU]/mL (ref 0.350–4.500)

## 2019-09-03 NOTE — Progress Notes (Signed)
Patient c/o back pain (8) constant

## 2019-09-06 LAB — PROTEIN ELECTROPHORESIS, SERUM
A/G Ratio: 1.3 (ref 0.7–1.7)
Albumin ELP: 3.3 g/dL (ref 2.9–4.4)
Alpha-1-Globulin: 0.2 g/dL (ref 0.0–0.4)
Alpha-2-Globulin: 0.8 g/dL (ref 0.4–1.0)
Beta Globulin: 1.1 g/dL (ref 0.7–1.3)
Gamma Globulin: 0.4 g/dL (ref 0.4–1.8)
Globulin, Total: 2.5 g/dL (ref 2.2–3.9)
Total Protein ELP: 5.8 g/dL — ABNORMAL LOW (ref 6.0–8.5)

## 2019-09-06 LAB — KAPPA/LAMBDA LIGHT CHAINS
Kappa free light chain: 28.6 mg/L — ABNORMAL HIGH (ref 3.3–19.4)
Kappa, lambda light chain ratio: 5.02 — ABNORMAL HIGH (ref 0.26–1.65)
Lambda free light chains: 5.7 mg/L (ref 5.7–26.3)

## 2019-10-06 DIAGNOSIS — M47816 Spondylosis without myelopathy or radiculopathy, lumbar region: Secondary | ICD-10-CM | POA: Diagnosis not present

## 2019-10-22 DIAGNOSIS — M48062 Spinal stenosis, lumbar region with neurogenic claudication: Secondary | ICD-10-CM | POA: Diagnosis not present

## 2019-10-22 DIAGNOSIS — M47816 Spondylosis without myelopathy or radiculopathy, lumbar region: Secondary | ICD-10-CM | POA: Diagnosis not present

## 2019-10-22 DIAGNOSIS — M5136 Other intervertebral disc degeneration, lumbar region: Secondary | ICD-10-CM | POA: Diagnosis not present

## 2019-10-22 DIAGNOSIS — M5416 Radiculopathy, lumbar region: Secondary | ICD-10-CM | POA: Diagnosis not present

## 2019-11-01 DIAGNOSIS — E7800 Pure hypercholesterolemia, unspecified: Secondary | ICD-10-CM | POA: Diagnosis not present

## 2019-11-01 DIAGNOSIS — G4733 Obstructive sleep apnea (adult) (pediatric): Secondary | ICD-10-CM | POA: Diagnosis not present

## 2019-11-01 DIAGNOSIS — I129 Hypertensive chronic kidney disease with stage 1 through stage 4 chronic kidney disease, or unspecified chronic kidney disease: Secondary | ICD-10-CM | POA: Diagnosis not present

## 2019-11-01 DIAGNOSIS — N1831 Chronic kidney disease, stage 3a: Secondary | ICD-10-CM | POA: Diagnosis not present

## 2019-11-01 DIAGNOSIS — R739 Hyperglycemia, unspecified: Secondary | ICD-10-CM | POA: Diagnosis not present

## 2019-11-08 DIAGNOSIS — C9110 Chronic lymphocytic leukemia of B-cell type not having achieved remission: Secondary | ICD-10-CM | POA: Diagnosis not present

## 2019-11-08 DIAGNOSIS — E7800 Pure hypercholesterolemia, unspecified: Secondary | ICD-10-CM | POA: Diagnosis not present

## 2019-11-08 DIAGNOSIS — Z6841 Body Mass Index (BMI) 40.0 and over, adult: Secondary | ICD-10-CM | POA: Diagnosis not present

## 2019-11-08 DIAGNOSIS — I2692 Saddle embolus of pulmonary artery without acute cor pulmonale: Secondary | ICD-10-CM | POA: Diagnosis not present

## 2019-11-08 DIAGNOSIS — N1831 Chronic kidney disease, stage 3a: Secondary | ICD-10-CM | POA: Diagnosis not present

## 2019-11-08 DIAGNOSIS — I129 Hypertensive chronic kidney disease with stage 1 through stage 4 chronic kidney disease, or unspecified chronic kidney disease: Secondary | ICD-10-CM | POA: Diagnosis not present

## 2019-11-08 DIAGNOSIS — Z0000 Encounter for general adult medical examination without abnormal findings: Secondary | ICD-10-CM | POA: Diagnosis not present

## 2019-11-08 DIAGNOSIS — F325 Major depressive disorder, single episode, in full remission: Secondary | ICD-10-CM | POA: Diagnosis not present

## 2019-11-19 DIAGNOSIS — M48062 Spinal stenosis, lumbar region with neurogenic claudication: Secondary | ICD-10-CM | POA: Diagnosis not present

## 2019-11-19 DIAGNOSIS — M5416 Radiculopathy, lumbar region: Secondary | ICD-10-CM | POA: Diagnosis not present

## 2019-11-19 DIAGNOSIS — M5136 Other intervertebral disc degeneration, lumbar region: Secondary | ICD-10-CM | POA: Diagnosis not present

## 2019-12-16 DIAGNOSIS — M48062 Spinal stenosis, lumbar region with neurogenic claudication: Secondary | ICD-10-CM | POA: Diagnosis not present

## 2019-12-16 DIAGNOSIS — M5416 Radiculopathy, lumbar region: Secondary | ICD-10-CM | POA: Diagnosis not present

## 2019-12-16 DIAGNOSIS — M5136 Other intervertebral disc degeneration, lumbar region: Secondary | ICD-10-CM | POA: Diagnosis not present

## 2019-12-30 ENCOUNTER — Other Ambulatory Visit: Payer: Self-pay | Admitting: Internal Medicine

## 2019-12-30 DIAGNOSIS — Z1231 Encounter for screening mammogram for malignant neoplasm of breast: Secondary | ICD-10-CM

## 2020-01-06 ENCOUNTER — Other Ambulatory Visit: Payer: Self-pay | Admitting: Physical Medicine and Rehabilitation

## 2020-01-06 DIAGNOSIS — M47816 Spondylosis without myelopathy or radiculopathy, lumbar region: Secondary | ICD-10-CM | POA: Diagnosis not present

## 2020-01-06 DIAGNOSIS — M5441 Lumbago with sciatica, right side: Secondary | ICD-10-CM | POA: Diagnosis not present

## 2020-01-06 DIAGNOSIS — M5442 Lumbago with sciatica, left side: Secondary | ICD-10-CM

## 2020-01-06 DIAGNOSIS — M5136 Other intervertebral disc degeneration, lumbar region: Secondary | ICD-10-CM | POA: Diagnosis not present

## 2020-01-06 DIAGNOSIS — M5416 Radiculopathy, lumbar region: Secondary | ICD-10-CM | POA: Diagnosis not present

## 2020-01-06 DIAGNOSIS — G8929 Other chronic pain: Secondary | ICD-10-CM | POA: Diagnosis not present

## 2020-01-06 DIAGNOSIS — M48062 Spinal stenosis, lumbar region with neurogenic claudication: Secondary | ICD-10-CM | POA: Diagnosis not present

## 2020-01-09 ENCOUNTER — Other Ambulatory Visit: Payer: Self-pay

## 2020-01-09 ENCOUNTER — Encounter: Payer: Self-pay | Admitting: Emergency Medicine

## 2020-01-09 ENCOUNTER — Emergency Department
Admission: EM | Admit: 2020-01-09 | Discharge: 2020-01-09 | Disposition: A | Discharge status: Home / Self Care | Payer: Medicare HMO | Attending: Student in an Organized Health Care Education/Training Program | Admitting: Student in an Organized Health Care Education/Training Program

## 2020-01-09 DIAGNOSIS — M5442 Lumbago with sciatica, left side: Secondary | ICD-10-CM | POA: Insufficient documentation

## 2020-01-09 DIAGNOSIS — Z7901 Long term (current) use of anticoagulants: Secondary | ICD-10-CM | POA: Diagnosis not present

## 2020-01-09 DIAGNOSIS — R069 Unspecified abnormalities of breathing: Secondary | ICD-10-CM | POA: Diagnosis not present

## 2020-01-09 DIAGNOSIS — Z79899 Other long term (current) drug therapy: Secondary | ICD-10-CM | POA: Diagnosis not present

## 2020-01-09 DIAGNOSIS — M5441 Lumbago with sciatica, right side: Secondary | ICD-10-CM | POA: Diagnosis not present

## 2020-01-09 DIAGNOSIS — I10 Essential (primary) hypertension: Secondary | ICD-10-CM | POA: Diagnosis not present

## 2020-01-09 DIAGNOSIS — R0902 Hypoxemia: Secondary | ICD-10-CM | POA: Diagnosis not present

## 2020-01-09 DIAGNOSIS — Z856 Personal history of leukemia: Secondary | ICD-10-CM | POA: Diagnosis not present

## 2020-01-09 DIAGNOSIS — R52 Pain, unspecified: Secondary | ICD-10-CM | POA: Diagnosis not present

## 2020-01-09 DIAGNOSIS — G8929 Other chronic pain: Secondary | ICD-10-CM | POA: Diagnosis not present

## 2020-01-09 DIAGNOSIS — M545 Low back pain: Secondary | ICD-10-CM | POA: Diagnosis present

## 2020-01-09 DIAGNOSIS — M5489 Other dorsalgia: Secondary | ICD-10-CM | POA: Diagnosis not present

## 2020-01-09 MED ORDER — LIDOCAINE 5 % EX PTCH: 1.0000 | MEDICATED_PATCH | Freq: Two times a day (BID) | CUTANEOUS | 0 refills | Status: DC

## 2020-01-09 MED ORDER — BACLOFEN 5 MG PO TABS: 5.0000 mg | ORAL_TABLET | Freq: Every evening | ORAL | 0 refills | Status: DC | PRN

## 2020-01-09 MED ORDER — LIDOCAINE 5 % EX PTCH: 1.0000 | MEDICATED_PATCH | CUTANEOUS | Status: DC

## 2020-01-09 MED ORDER — LIDOCAINE 5 % EX PTCH
1.0000 | MEDICATED_PATCH | CUTANEOUS | Status: DC
  Administered 2020-01-09: 1 via TRANSDERMAL
  Filled 2020-01-09: qty 1

## 2020-01-09 MED ORDER — HYDROMORPHONE HCL 1 MG/ML IJ SOLN
0.5000 mg | Freq: Once | INTRAMUSCULAR | Status: AC
  Administered 2020-01-09: 0.5 mg via INTRAMUSCULAR
  Filled 2020-01-09: qty 1

## 2020-01-09 NOTE — ED Triage Notes (Signed)
Patient brought in by ems from home. Patient with complaint of chronic bilateral lower back pain radiating down her legs. Patient states that the pain has become worse. Patient states that she was seen by her PCP on Thursday and started on hydrocodone but that it is not helping the pain.

## 2020-01-09 NOTE — ED Notes (Signed)
See triage note  Presents with lower back pain  States pain is moving into both legs  Hx of same States she has been getting injections but has not help  Currently taking norco for pain  Also has a MRI scheduled for same

## 2020-01-09 NOTE — ED Notes (Signed)
Urine sent to lab with save label

## 2020-01-09 NOTE — ED Provider Notes (Signed)
[facility] Emergency Department Provider Note  ____________________________________________  Time seen: Approximately 7:41 AM  I have reviewed the triage vital signs and the nursing notes.   HISTORY  Chief Complaint Back Pain    HPI Jamie Phillips is a 79 y.o. female that presents to the emergency department for evaluation of worsening chronic low back pain that radiates into the back of bilateral posterior thighs for several months.  Patient states that now she also feels tingling in her toes.  Patient saw Barnie Del at the [facility] [facility] on Thursday and was prescribed hydrocodone for her back pain, which has not helped. She has an MRI scheduled for October 4.  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  She has also been referred to neurosurgery and has an appointment in October.  She went to physical therapy in the spring, which did not improve her symptoms.  She has been receiving epidural steroid injections, which relieve her symptoms for a short period of time.  She was recommended surgery in the past but was always hesitant to proceed with surgical intervention.  She is on Eliquis for previous DVT.  She has tried Flexeril in the past but this was discontinued due to drowsiness.  She has been up walking but it is painful.  She uses a walker at home.  No bowel or bladder dysfunction or saddle anesthesias.  No falls.  No fevers.  Patient lives with her son.   Past Medical History:  Diagnosis Date  . Anginal pain ([place])    atypical chest pain  . Anxiety   . Arthritis   . Back pain   . Cancer ([place])    cll  . Chronic kidney disease   . Chronic kidney disease   . CLL (chronic lymphocytic leukemia) ([place])   . Depression   . Epigastric pain   . GERD (gastroesophageal reflux disease)   . Headache   . Hemorrhoids   . Hypercholesteremia   . Hyperlipidemia   . Hypertension   . 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 Macular degeneration   . MGUS (monoclonal gammopathy of unknown significance)   . Morbid obesity ([place])   . Obesity   . Pulmonary embolism ([place])   . PVD (peripheral vascular disease) ([place])   . Sleep apnea     Patient Active Problem List   Diagnosis Date Noted  . Other fatigue 03/07/2019  . Monoclonal gammopathy 03/04/2019  . COVID-19 12/14/2018  . Right lower quadrant pain 10/09/2018  . Calculus of gallbladder without cholecystitis without obstruction 08/27/2016  . 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 History of DVT in adulthood 03/04/2016  . DJD (degenerative joint disease) 03/04/2016  . Pain in limb 03/04/2016  . Venous insufficiency 03/04/2016  . CLL (chronic lymphocytic leukemia) ([place]) 11/21/2015  . Acute respiratory failure ([place]) 08/14/2015  . Acute bronchitis 05/11/2015  . Essential hypertension 05/04/2015  . Hyperlipidemia 05/04/2015  . Pain in the chest 05/04/2015  . SOB (shortness of breath) 05/04/2015    Past Surgical History:  Procedure Laterality Date  . APPENDECTOMY    . BREAST BIOPSY Left 08/19/04   lt bx/clip-neg  . COLONOSCOPY    . COLONOSCOPY WITH PROPOFOL N/A 03/10/2017   Procedure: COLONOSCOPY WITH PROPOFOL;  Surgeon: [name], MD;  Location: [facility] ENDOSCOPY;  Service: Endoscopy;  Laterality: N/A;  . ESOPHAGOGASTRODUODENOSCOPY (EGD) WITH PROPOFOL N/A 02/02/2019   Procedure:  ESOPHAGOGASTRODUODENOSCOPY (EGD) WITH PROPOFOL;  Surgeon: Toledo, Benay Pike, MD;  Location: [facility] ENDOSCOPY;  Service: Endoscopy;  Laterality: N/A;  . IVC FILTER INSERTION N/A 06/11/2016   Procedure: IVC Filter Insertion;  Surgeon: Katha Cabal, MD;  Location: [place] CV LAB;  Service: Cardiovascular;  Laterality: N/A;  . IVC FILTER REMOVAL N/A 05/06/2017   Procedure: IVC FILTER REMOVAL;  Surgeon: Katha Cabal, MD;  Location: [place] CV LAB;  Service: Cardiovascular;  Laterality: N/A;  . JOINT  REPLACEMENT Left 06/17/2016  . LAPAROSCOPIC APPENDECTOMY N/A 10/10/2018   Procedure: APPENDECTOMY LAPAROSCOPIC ATTEMPTED CONVERTED TO OPEN;  Surgeon: Herbert Pun, MD;  Location: [facility] ORS;  Service: General;  Laterality: N/A;  . LAPAROSCOPY N/A 10/10/2018   Procedure: LAPAROSCOPY DIAGNOSTIC ATTEMPTED CONVERTED TO OPEN;  Surgeon: Herbert Pun, MD;  Location: [facility] ORS;  Service: General;  Laterality: N/A;  . neck fusion    . PARTIAL COLECTOMY Right 10/10/2018   Procedure: PARTIAL COLECTOMY;  Surgeon: Herbert Pun, MD;  Location: [facility] ORS;  Service: General;  Laterality: Right;  . PERIPHERAL VASCULAR CATHETERIZATION N/A 08/15/2015   Procedure: pulmonary angiogram with lysis;  Surgeon: Katha Cabal, MD;  Location: [place] CV LAB;  Service: Cardiovascular;  Laterality: N/A;  . WRIST SURGERY Left 2000    Prior to Admission medications   Medication Sig Start Date End Date Taking? Authorizing Provider  acetaminophen (TYLENOL) 650 MG CR tablet Take 650-1,300 mg by mouth every 8 (eight) hours as needed for pain. TYLENOL ARTHRITIS    [provider]  albuterol (PROVENTIL HFA;VENTOLIN HFA) 108 (90 Base) MCG/ACT inhaler Inhale 2 puffs into the lungs every 6 (six) hours as needed for wheezing or shortness of breath.    [provider]  Baclofen 5 MG TABS Take 5 mg by mouth at bedtime as needed. 01/09/20   Laban Emperor, PA-C  celecoxib (CELEBREX) 100 MG capsule  08/14/19   [provider]  ELIQUIS 5 MG TABS tablet TAKE 1 TABLET TWICE DAILY Patient taking differently: Take 5 mg by mouth 2 (two) times daily.  01/03/16   Cammie Sickle, MD  escitalopram (LEXAPRO) 10 MG tablet Take 10 mg by mouth daily.    [provider]  gabapentin (NEURONTIN) 300 MG capsule Take 300 mg by mouth 2 (two) times daily.     [provider]  lidocaine (LIDODERM) 5 % Place 1 patch onto the skin every 12 (twelve) hours. Remove & Discard patch within  12 hours or as directed by MD 01/09/20 01/08/21  Laban Emperor, PA-C  loperamide (IMODIUM) 2 MG capsule Take 2 mg by mouth as needed for diarrhea or loose stools.    [provider]  losartan-hydrochlorothiazide (HYZAAR) 100-12.5 MG tablet Take 1 tablet by mouth daily. Reported on 08/22/2015    [provider]  Multiple Vitamins-Minerals (PRESERVISION AREDS 2 PO) Take 1 capsule by mouth 2 (two) times daily.     [provider]  pantoprazole (PROTONIX) 40 MG tablet Take 1 tablet (40 mg total) by mouth 2 (two) times daily for 14 days. Patient taking differently: Take 40 mg by mouth daily.  10/17/18 09/03/19  Herbert Pun, MD  pravastatin (PRAVACHOL) 40 MG tablet Take 40 mg by mouth daily.     [provider]  propranolol (INDERAL) 40 MG tablet Take 40 mg by mouth 2 (two) times a day.    [provider]  sucralfate (CARAFATE) 1 g tablet Take 1 g by mouth 4 (four) times daily -  with  meals and at bedtime.    [provider]  tiZANidine (ZANAFLEX) 4 MG tablet Take 4 mg by mouth at bedtime as needed for muscle spasms.     [provider]  traMADol (ULTRAM) 50 MG tablet Take by mouth every 6 (six) hours as needed.    [provider]    Allergies Oxycodone and Morphine  Family History  Problem Relation Age of Onset  . Heart failure Father   . Congestive Heart Failure Father   . Pulmonary embolism Mother   . Breast cancer Neg Hx     Social History Social History   Tobacco Use  . Smoking status: Never Smoker  . Smokeless tobacco: Never Used  Substance Use Topics  . Alcohol use: No    Alcohol/week: 0.0 standard drinks  . Drug use: No     Review of Systems  Respiratory: No SOB. Gastrointestinal: No abdominal pain.  No nausea, no vomiting.  Musculoskeletal: Positive for low back pain. Skin: Negative for rash, abrasions, lacerations, ecchymosis. Neurological: Negative for headaches.  Positive for  tingling.   ____________________________________________   PHYSICAL EXAM:  VITAL SIGNS: ED Triage Vitals  Enc Vitals Group     BP 01/09/20 0031 (!) 179/80     Pulse Rate 01/09/20 0031 74     Resp 01/09/20 0031 (!) 22     Temp 01/09/20 0031 98.3 F (36.8 C)     Temp Source 01/09/20 0031 Oral     SpO2 01/09/20 0031 100 %     Weight 01/09/20 0034 272 lb (123.4 kg)     Height 01/09/20 0034 5\' 5"  (1.651 m)     Head Circumference --      Peak Flow --      Pain Score 01/09/20 0034 10     Pain Loc --      Pain Edu? --      Excl. in [place]? --      Constitutional: Alert and oriented. Well appearing and in no acute distress. Eyes: Conjunctivae are normal. PERRL. EOMI. Head: Atraumatic. ENT:      Ears:      Nose: No congestion/rhinnorhea.      Mouth/Throat: Mucous membranes are moist.  Neck: No stridor.  Cardiovascular: Normal rate, regular rhythm.  Good peripheral circulation. Respiratory: Normal respiratory effort without tachypnea or retractions. Lungs CTAB. Good air entry to the bases with no decreased or absent breath sounds. Musculoskeletal: Full range of motion to all extremities. No gross deformities appreciated.  Tenderness to palpation throughout lumbar spine and lumbar paraspinal muscles.  Strength equal in lower extremities bilaterally.  Full range of motion of toes.  Ambulatory. Neurologic:  Normal speech and language. No gross focal neurologic deficits are appreciated.  Skin:  Skin is warm, dry and intact. No rash noted. Psychiatric: Mood and affect are normal. Speech and behavior are normal. Patient exhibits appropriate insight and judgement.   ____________________________________________   LABS (all labs ordered are listed, but only abnormal results are displayed)  Labs Reviewed - No data to display ____________________________________________  EKG   ____________________________________________  RADIOLOGY  No results  found.  ____________________________________________    PROCEDURES  Procedure(s) performed:    Procedures    Medications  lidocaine (LIDODERM) 5 % 1 patch (1 patch Transdermal Patch Applied 01/09/20 0819)  HYDROmorphone (DILAUDID) injection 0.5 mg (0.5 mg Intramuscular Given 01/09/20 0820)     ____________________________________________   INITIAL IMPRESSION / ASSESSMENT AND PLAN / ED COURSE  Pertinent labs & imaging results that  were available during my care of the patient were reviewed by me and considered in my medical decision making (see chart for details).  Review of the  [place]RS was performed in accordance of the [place] prior to dispensing any controlled drugs.   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  Vital signs and exam are reassuring.  Patient has good follow-up with Ortho and neurosurgery.  She has an MRI scheduled in 2 weeks.  Patient was given a low-dose of Dilaudid, with improvement of her symptoms.  She is able to ambulate without any assistance.  She is able to get back into bed and move in the bed without assistance.  She feels improved and is ready to be discharged.  Patient will be discharged home with prescriptions for baclofen and Lidoderm. Patient is to follow up with orthopedics as directed. Patient is given ED precautions to return to the ED for any worsening or new symptoms.  Jamie Phillips was evaluated in Emergency Department on 01/09/2020 for the symptoms described in the history of present illness. She was evaluated in the context of the global COVID-19 pandemic, which necessitated consideration that the patient might be at risk for infection with the SARS-CoV-2 virus that causes COVID-19. Institutional protocols and algorithms that pertain to the evaluation of patients at risk for COVID-19 are in a state of rapid change based on information released by regulatory bodies including the CDC and federal and state organizations. These  policies and algorithms were followed during the patient's care in the ED.   ____________________________________________  FINAL CLINICAL IMPRESSION(S) / ED DIAGNOSES  Final diagnoses:  Chronic bilateral low back pain with bilateral sciatica      NEW MEDICATIONS STARTED DURING THIS VISIT:  ED Discharge Orders         Ordered    Baclofen 5 MG TABS  At bedtime PRN        01/09/20 0855    lidocaine (LIDODERM) 5 %  Every 12 hours        01/09/20 0856              This chart was dictated using voice recognition software/Dragon. Despite best efforts to proofread, errors can occur which can change the meaning. Any change was purely unintentional.    Laban Emperor, PA-C 01/09/20 1351    Merlyn Lot, MD 01/09/20 [id number]

## 2020-01-11 DIAGNOSIS — H353130 Nonexudative age-related macular degeneration, bilateral, stage unspecified: Secondary | ICD-10-CM | POA: Diagnosis not present

## 2020-01-13 ENCOUNTER — Other Ambulatory Visit: Payer: Self-pay

## 2020-01-13 ENCOUNTER — Ambulatory Visit
Admission: RE | Admit: 2020-01-13 | Discharge: 2020-01-13 | Disposition: A | Discharge status: Home / Self Care | Payer: Medicare HMO | Source: Ambulatory Visit | Attending: Internal Medicine | Admitting: Internal Medicine

## 2020-01-13 DIAGNOSIS — Z1231 Encounter for screening mammogram for malignant neoplasm of breast: Secondary | ICD-10-CM | POA: Diagnosis not present

## 2020-01-18 DIAGNOSIS — H353130 Nonexudative age-related macular degeneration, bilateral, stage unspecified: Secondary | ICD-10-CM | POA: Diagnosis not present

## 2020-01-24 ENCOUNTER — Other Ambulatory Visit: Payer: Self-pay

## 2020-01-24 ENCOUNTER — Ambulatory Visit
Admission: RE | Admit: 2020-01-24 | Discharge: 2020-01-24 | Disposition: A | Discharge status: Home / Self Care | Payer: Medicare HMO | Source: Ambulatory Visit | Attending: Physical Medicine and Rehabilitation | Admitting: Physical Medicine and Rehabilitation

## 2020-01-24 DIAGNOSIS — M5442 Lumbago with sciatica, left side: Secondary | ICD-10-CM | POA: Insufficient documentation

## 2020-01-24 DIAGNOSIS — M5441 Lumbago with sciatica, right side: Secondary | ICD-10-CM | POA: Insufficient documentation

## 2020-01-24 DIAGNOSIS — G8929 Other chronic pain: Secondary | ICD-10-CM | POA: Insufficient documentation

## 2020-01-24 DIAGNOSIS — M545 Low back pain, unspecified: Secondary | ICD-10-CM | POA: Diagnosis not present

## 2020-01-25 DIAGNOSIS — M48061 Spinal stenosis, lumbar region without neurogenic claudication: Secondary | ICD-10-CM | POA: Diagnosis not present

## 2020-01-31 DIAGNOSIS — N83202 Unspecified ovarian cyst, left side: Secondary | ICD-10-CM | POA: Diagnosis not present

## 2020-01-31 DIAGNOSIS — N9489 Other specified conditions associated with female genital organs and menstrual cycle: Secondary | ICD-10-CM | POA: Diagnosis not present

## 2020-02-01 DIAGNOSIS — M5416 Radiculopathy, lumbar region: Secondary | ICD-10-CM | POA: Diagnosis not present

## 2020-02-01 DIAGNOSIS — M5136 Other intervertebral disc degeneration, lumbar region: Secondary | ICD-10-CM | POA: Diagnosis not present

## 2020-02-01 DIAGNOSIS — M4726 Other spondylosis with radiculopathy, lumbar region: Secondary | ICD-10-CM | POA: Diagnosis not present

## 2020-02-15 DIAGNOSIS — N83202 Unspecified ovarian cyst, left side: Secondary | ICD-10-CM | POA: Diagnosis not present

## 2020-02-15 DIAGNOSIS — N949 Unspecified condition associated with female genital organs and menstrual cycle: Secondary | ICD-10-CM | POA: Diagnosis not present

## 2020-02-23 DIAGNOSIS — M47816 Spondylosis without myelopathy or radiculopathy, lumbar region: Secondary | ICD-10-CM | POA: Diagnosis not present

## 2020-03-02 NOTE — Progress Notes (Signed)
[facility]  [address] [place], [place] [address] Phone: [phone]  Fax: [phone]   Clinic Day:  03/06/2020  Referring physician: Kirk Ruths, MD  Chief Complaint: Jamie Phillips is a 79 y.o. 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   HPI: The patient was last seen in the medical oncology clinic on 09/03/2019. At that time, she felt good except for some back pain. Hematocrit was 40.2, hemoglobin 12.7, platelets 226,000, WBC 13,700 (ANC 4,200). Creatinine was 1.11 (CrCl 48 ml/min). Calcium was 8.6. Total protein was 6.2. TSH was 1.304. LDH was 168. M spike was not detected. Kappa free light chains were 28.6, lambda free light chains 5.7, and ratio 5.02 (elevated).  We discussed continued surveillance. She continued Eliquis. She was to follow up with dermatology.  Screening mammogram on 01/13/2020 revealed no evidence of malignancy.  During the interim, she has been "here".  She has back pain that radiates down to her toes. She had a nerve "burned" on 02/23/2020 which she thought would fix her pain, but it hasn't helped. She had a mild nose bleed this morning. She bruises easily on her arms. She has a "yellow powder" that she takes before she eats. If she does not take it, she immediately has diarrhea after eating.  The patient is tired and always wants to sleep during the day. She does not feel refreshed when she wakes up in the morning. She uses a CPAP machine but is not sure that it is working. She denies fevers, sweats, and weight loss.   Past Medical History:  Diagnosis Date  . Anginal pain ([place])    atypical chest pain  . Anxiety   . Arthritis   . Back pain   . Cancer ([place])    cll  . Chronic kidney disease   . Chronic kidney disease   . CLL (chronic lymphocytic leukemia) ([place])   . Depression   . Epigastric pain   . GERD (gastroesophageal reflux  disease)   . Headache   . Hemorrhoids   . Hypercholesteremia   . Hyperlipidemia   . Hypertension   . 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 Macular degeneration   . MGUS (monoclonal gammopathy of unknown significance)   . Morbid obesity ([place])   . Obesity   . Pulmonary embolism ([place])   . PVD (peripheral vascular disease) ([place])   . Sleep apnea     Past Surgical History:  Procedure Laterality Date  . APPENDECTOMY    . BREAST BIOPSY Left 08/19/04   lt bx/clip-neg  . COLONOSCOPY    . COLONOSCOPY WITH PROPOFOL N/A 03/10/2017   Procedure: COLONOSCOPY WITH PROPOFOL;  Surgeon: [name], MD;  Location: [facility] ENDOSCOPY;  Service: Endoscopy;  Laterality: N/A;  . ESOPHAGOGASTRODUODENOSCOPY (EGD) WITH PROPOFOL N/A 02/02/2019   Procedure: ESOPHAGOGASTRODUODENOSCOPY (EGD) WITH PROPOFOL;  Surgeon: Toledo, Benay Pike, MD;  Location: [facility] ENDOSCOPY;  Service: Endoscopy;  Laterality: N/A;  . IVC FILTER INSERTION N/A 06/11/2016   Procedure: IVC Filter Insertion;  Surgeon: Katha Cabal, MD;  Location: [place] CV LAB;  Service: Cardiovascular;  Laterality: N/A;  . IVC FILTER REMOVAL N/A 05/06/2017   Procedure: IVC FILTER REMOVAL;  Surgeon: Katha Cabal, MD;  Location: [place] CV LAB;  Service: Cardiovascular;  Laterality: N/A;  . JOINT REPLACEMENT Left 06/17/2016  . LAPAROSCOPIC APPENDECTOMY N/A 10/10/2018   Procedure: APPENDECTOMY LAPAROSCOPIC  ATTEMPTED CONVERTED TO OPEN;  Surgeon: Herbert Pun, MD;  Location: [facility] ORS;  Service: General;  Laterality: N/A;  . LAPAROSCOPY N/A 10/10/2018   Procedure: LAPAROSCOPY DIAGNOSTIC ATTEMPTED CONVERTED TO OPEN;  Surgeon: Herbert Pun, MD;  Location: [facility] ORS;  Service: General;  Laterality: N/A;  . neck fusion    . PARTIAL COLECTOMY Right 10/10/2018   Procedure: PARTIAL COLECTOMY;  Surgeon: Herbert Pun, MD;  Location: [facility] ORS;  Service: General;  Laterality: Right;  . PERIPHERAL VASCULAR  CATHETERIZATION N/A 08/15/2015   Procedure: pulmonary angiogram with lysis;  Surgeon: Katha Cabal, MD;  Location: [place] CV LAB;  Service: Cardiovascular;  Laterality: N/A;  . WRIST SURGERY Left 2000    Family History  Problem Relation Age of Onset  . Heart failure Father   . Congestive Heart Failure Father   . Pulmonary embolism Mother   . Breast cancer Neg Hx     Social History:  reports that she has never smoked. She has never used smokeless tobacco. She reports that she does not drink alcohol and does not use drugs. She does not smoke or drink alcohol. 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  She denies any known exposure to radiation or toxins. She takes care of her daughter who has had 2 strokes. She lives with her son in [place], [place]. The patient is alone today.  Allergies:  Allergies  Allergen Reactions  . Oxycodone Other (See Comments)    Reaction:  Hallucinations  . Morphine Other (See Comments)    Reaction:  Hallucinations    Current Medications: Current Outpatient Medications  Medication Sig Dispense Refill  . acetaminophen (TYLENOL) 650 MG CR tablet Take 650-1,300 mg by mouth every 8 (eight) hours as needed for pain. TYLENOL ARTHRITIS    . albuterol (PROVENTIL HFA;VENTOLIN HFA) 108 (90 Base) MCG/ACT inhaler Inhale 2 puffs into the lungs every 6 (six) hours as needed for wheezing or shortness of breath.    . Baclofen 5 MG TABS Take 5 mg by mouth at bedtime as needed. 7 tablet 0  . celecoxib (CELEBREX) 100 MG capsule     . ELIQUIS 5 MG TABS tablet TAKE 1 TABLET TWICE DAILY (Patient taking differently: Take 5 mg by mouth 2 (two) times daily. ) 180 tablet 6  . escitalopram (LEXAPRO) 10 MG tablet Take 10 mg by mouth daily.    Marland Kitchen gabapentin (NEURONTIN) 300 MG capsule Take 300 mg by mouth 2 (two) times daily.     Marland Kitchen lidocaine (LIDODERM) 5 % Place 1 patch onto the skin every 12 (twelve) hours. Remove & Discard  patch within 12 hours or as directed by MD 10 patch 0  . loperamide (IMODIUM) 2 MG capsule Take 2 mg by mouth as needed for diarrhea or loose stools.    Marland Kitchen losartan-hydrochlorothiazide (HYZAAR) 100-12.5 MG tablet Take 1 tablet by mouth daily. Reported on 08/22/2015    . Multiple Vitamins-Minerals (PRESERVISION AREDS 2 PO) Take 1 capsule by mouth 2 (two) times daily.     . pantoprazole (PROTONIX) 40 MG tablet Take 1 tablet (40 mg total) by mouth 2 (two) times daily for 14 days. (Patient taking differently: Take 40 mg by mouth daily. ) 28 tablet 0  . pravastatin (PRAVACHOL) 40 MG tablet Take 40 mg by mouth daily.     . propranolol (INDERAL) 40 MG tablet Take 40 mg by mouth 2 (two) times a day.    . sucralfate (  CARAFATE) 1 g tablet Take 1 g by mouth 4 (four) times daily -  with meals and at bedtime.    Marland Kitchen tiZANidine (ZANAFLEX) 4 MG tablet Take 4 mg by mouth at bedtime as needed for muscle spasms.     . traMADol (ULTRAM) 50 MG tablet Take by mouth every 6 (six) hours as needed.     No current facility-administered medications for this visit.    Review of Systems  Constitutional: Positive for malaise/fatigue (chronic). Negative for chills, diaphoresis, fever and weight loss (up 2 lbs).  HENT: Positive for hearing loss and nosebleeds (this morning). Negative for congestion, ear discharge, ear pain, sinus pain, sore throat and tinnitus.   Eyes: Negative.  Negative for blurred vision.  Respiratory: Negative for cough, hemoptysis, sputum production and shortness of breath.        Sleep apnea, uses CPAP  Cardiovascular: Negative.  Negative for chest pain, palpitations and leg swelling.  Gastrointestinal: Negative for abdominal pain, blood in stool, constipation, diarrhea, heartburn, melena, nausea and vomiting.  Genitourinary: Negative.  Negative for dysuria, frequency, hematuria and urgency.  Musculoskeletal: Positive for back pain (chronic). Negative for joint pain, myalgias and neck pain.  Skin:  Negative.  Negative for itching and rash.  Neurological: Negative for dizziness, sensory change, speech change, focal weakness, seizures, weakness and headaches.  Endo/Heme/Allergies: Bruises/bleeds easily (chronic, arms).  Psychiatric/Behavioral: Negative.  Negative for depression and memory loss. The patient is not nervous/anxious and does not have insomnia.   All other systems reviewed and are negative.  Performance status (ECOG): 1  Vitals Blood pressure 134/70, pulse (!) 59, temperature 97.7 F (36.5 C), temperature source Tympanic, resp. rate 20, weight 275 lb 0.4 oz (124.7 kg), SpO2 95 %.   Physical Exam Vitals and nursing note reviewed.  Constitutional:      General: She is not in acute distress.    Appearance: She is not diaphoretic.     Comments: Rolling walker by her side. Requires assistance onto exam table.  HENT:     Head: Normocephalic and atraumatic.     Comments: Short gray hair.    Mouth/Throat:     Mouth: Mucous membranes are moist.     Pharynx: Oropharynx is clear. No oropharyngeal exudate.  Eyes:     General: No scleral icterus.       Right eye: No discharge.        Left eye: No discharge.     Conjunctiva/sclera: Conjunctivae normal.     Pupils: Pupils are equal, round, and reactive to light.     Comments: Glasses.  Blue eyes.  Neck:     Vascular: No JVD.  Cardiovascular:     Rate and Rhythm: Normal rate and regular rhythm.     Heart sounds: Normal heart sounds. No murmur heard.  No friction rub. No gallop.   Pulmonary:     Effort: Pulmonary effort is normal. No respiratory distress.     Breath sounds: Normal breath sounds. No wheezing or rales.  Chest:     Chest wall: No tenderness.  Abdominal:     General: Bowel sounds are normal. There is no distension.     Palpations: Abdomen is soft. There is no mass.     Tenderness: There is no abdominal tenderness. There is no guarding or rebound.  Musculoskeletal:        General: Tenderness (left ribs,  bilateral lower back (L3-L4)) present. Normal range of motion.     Cervical back: Normal range of motion  and neck supple.  Lymphadenopathy:     Head:     Right side of head: No preauricular, posterior auricular or occipital adenopathy.     Left side of head: No preauricular, posterior auricular or occipital adenopathy.     Cervical: No cervical adenopathy.     Right cervical: No superficial cervical adenopathy.    Left cervical: No superficial cervical adenopathy.     Upper Body:     Right upper body: No supraclavicular or pectoral adenopathy.     Left upper body: No supraclavicular or pectoral adenopathy.     Lower Body: No right inguinal adenopathy. No left inguinal adenopathy.  Skin:    General: Skin is warm and dry.     Coloration: Skin is not pale.     Findings: No erythema or rash.  Neurological:     Mental Status: She is alert and oriented to person, place, and time.  Psychiatric:        Mood and Affect: Mood and affect normal.        Cognition and Memory: Memory normal.        Judgment: Judgment normal.    No visits with results within 3 Day(s) from this visit.  Latest known visit with results is:  Admission on 02/02/2019, Discharged on 02/02/2019  Component Date Value Ref Range Status  . SURGICAL PATHOLOGY 02/02/2019    Final-Edited                   Value:SURGICAL PATHOLOGY CASE: [id number] PATIENT: Jamie Phillips Surgical Pathology Report  Specimen Submitted: A. Stomach, antrum; cbx  Clinical History: Upper abdominal pain, GERD, dysphagia; Linear gastritis, tertiary peristaltic waves noted.   DIAGNOSIS: A.  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 - NEGATIVE FOR H. PYLORI, DYSPLASIA, AND MALIGNANCY.  GROSS DESCRIPTION: A. Labeled: C BX of stomach Received: In formalin Tissue fragment(s): 3 Size:  From 0.2-0.3 cm Description: Tan soft tissue fragments Entirely submitted in 1 cassette.  Final Diagnosis performed by Quay Burow, MD.   Electronically signed 02/03/2019 2:08:43PM The electronic signature indicates that the named Attending Pathologist has evaluated the specimen Technical component performed at [facility], [address] [place], [place] [address] Lab: [phone] Dir: Rush Farmer, MD, MMM  Professional component performed at [facility], [facility], [place], [place], [place] [address] Lab: [phone] Dir: Dellia Nims. Reuel Derby, MD    Assessment:  BRENIYA GOERTZEN is a 79 y.o. 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  WBC has ranged between 15,700 - 31,600 without trend since 08/15/2015.  She was diagnosed with bilateral massive pulmonary emboli in 08/14/2015.  Chest CT angiogram on 08/14/2015 revealed anacute and subacute pulmonary emboli with evidence of right heart strain (RV/LV Ratio = 1.2) consistent with at least submassive (intermediate risk) PE.  Bilateral lower extremity duplex on 08/15/2015 revealed a right posterior tibial DVT.  She underwent pulmonary angiogram with lysis.  Factor V Leiden and prothrombin gene mutation were negative on 11/14/2015.  She is on chronic Eliquis.  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    M-spike has been followed (gm/dL):  0.1 on 07/16/2012, 0 on 07/28/2014, 0 on 01/26/2015, 0 on 08/01/2015, 0 on 11/14/2015, 0.3 on 07/30/2016, 0.4 on 07/29/2017, 0 on 08/31/2018, 0 on 03/05/2019, 0 on 09/03/2019 and 0 on 03/06/2020.  Kappa free light chains have been followed:  32.73 (ratio 4.41) on 08/01/2015, 26.2 (ratio 4.44) on  07/30/2016, 36.7 (ratio 4.96) on 07/29/2017, and 36.7 (ratio 5.24) on 08/31/2018.  She has epigastric pain and dysphagia.  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 There was a small hiatal hernia. There were no persisting strictures.  EGD on 02/02/2019 revealed abnormal esophageal motility, suspicious for esophageal spasm. There was erythematous mucosa in the antrum. The duodenum was normal.   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 There was no H. Pylori, dysplasia, or malignancy.   She was admitted to [facility] from 10/09/2018 - 10/17/2018 with perforated diverticulitis.  She underwent laparoscopic (hand assisted) right hemicolectomy.   She was admitted to [facility] on 12/14/2018 - 12/20/2018 with COVID-19. She was hypoxic. Chest CT on 10/16/2018 showed multiple ill-defined airspace opacities throughout both lungs peripherally most c/w multi-focal pneumonia.  She was treated with 5 days of Remdesivir.  She required 2 L/min nasal cannula.  She received steroids.  She has received both doses of the COVID-19 vaccine.   Symptomatically, she always feels tired and wants to sleep during the day. She does not feel refreshed when she wakes up in the morning; she is unsure if her CPAP machine is working. She denies fevers, sweats, and weight loss.  Exam reveals no adenopathy or hepatosplenomegaly.  Plan: 1.   Labs today: CBC with diff, CMP, SPEP, LDH. 2.   Chronic lymphocytic leukemia (CLL)             Clinically, she appears to be doing well.   Fatigue does not appear related to her CLL.  Exam reveals no adenopathy or hepatosplenomegaly.             She has stage 0 CLL             Hematocrit  40.1.  Hemoglobin 12.7.  MCV 96.6.  Platelets 275,000.  WBC 20,200 (ANC 9800; ALC 9200).  Continue surveillance. 3.   IgA monoclonal gammopathy of unknown significance               Creatinine 1.16.  Calcium 9.2.  Albumen 3.5.  SPEP revealed an M spike of 0 today.  Check SPEP in 6 months and if negative, no further evaluation unless concerning symptoms or labs 4.   Pulmonary embolism and RLE DVT             Patient had a massive pulmonary embolism requiring thrombolysis.             She requires indefinite anticoagulation.             Continue Eliquis. 5.  Chronic fatigue  Etiology may be secondary to sleep apnea.  Consider overnight oximetry and possible replacement of CPAP machine. 6.   RTC in 6 months for MD assessment and labs (CBC with diff, CMP, SPEP, LDH).  I discussed the assessment and treatment plan with the patient.  The patient was provided an opportunity to ask questions and all were answered.  The patient agreed with the plan and demonstrated an understanding of the instructions.  The patient was advised to call back if the symptoms worsen or if the condition fails to improve as anticipated.   Lequita Asal, MD, PhD    03/06/2020, 2:15 PM  I, Mirian Mo Tufford, am acting as a Education administrator for Lequita Asal, MD.  I, [place] Mike Gip, MD, have reviewed the above documentation for accuracy and completeness, and I agree with the above.

## 2020-03-06 ENCOUNTER — Ambulatory Visit: Payer: Medicare HMO | Admitting: Hematology and Oncology

## 2020-03-06 ENCOUNTER — Other Ambulatory Visit: Payer: Medicare HMO

## 2020-03-06 ENCOUNTER — Encounter: Payer: Self-pay | Admitting: Hematology and Oncology

## 2020-03-06 ENCOUNTER — Inpatient Hospital Stay: Payer: Medicare HMO | Admitting: Hematology and Oncology

## 2020-03-06 ENCOUNTER — Other Ambulatory Visit: Payer: Self-pay

## 2020-03-06 ENCOUNTER — Inpatient Hospital Stay: Payer: Medicare HMO | Attending: Hematology and Oncology

## 2020-03-06 VITALS — BP 134/70 | HR 59 | Temp 97.7°F | Resp 20 | Wt 275.0 lb

## 2020-03-06 DIAGNOSIS — F418 Other specified anxiety disorders: Secondary | ICD-10-CM | POA: Insufficient documentation

## 2020-03-06 DIAGNOSIS — E7800 Pure hypercholesterolemia, unspecified: Secondary | ICD-10-CM | POA: Insufficient documentation

## 2020-03-06 DIAGNOSIS — I2782 Chronic pulmonary embolism: Secondary | ICD-10-CM | POA: Diagnosis not present

## 2020-03-06 DIAGNOSIS — I10 Essential (primary) hypertension: Secondary | ICD-10-CM | POA: Insufficient documentation

## 2020-03-06 DIAGNOSIS — N189 Chronic kidney disease, unspecified: Secondary | ICD-10-CM | POA: Insufficient documentation

## 2020-03-06 DIAGNOSIS — Z86718 Personal history of other venous thrombosis and embolism: Secondary | ICD-10-CM | POA: Insufficient documentation

## 2020-03-06 DIAGNOSIS — G4730 Sleep apnea, unspecified: Secondary | ICD-10-CM | POA: Insufficient documentation

## 2020-03-06 DIAGNOSIS — M129 Arthropathy, unspecified: Secondary | ICD-10-CM | POA: Insufficient documentation

## 2020-03-06 DIAGNOSIS — Z86711 Personal history of pulmonary embolism: Secondary | ICD-10-CM | POA: Insufficient documentation

## 2020-03-06 DIAGNOSIS — Z79899 Other long term (current) drug therapy: Secondary | ICD-10-CM | POA: Insufficient documentation

## 2020-03-06 DIAGNOSIS — Z791 Long term (current) use of non-steroidal anti-inflammatories (NSAID): Secondary | ICD-10-CM | POA: Diagnosis not present

## 2020-03-06 DIAGNOSIS — C9110 Chronic lymphocytic leukemia of B-cell type not having achieved remission: Secondary | ICD-10-CM | POA: Insufficient documentation

## 2020-03-06 DIAGNOSIS — R5383 Other fatigue: Secondary | ICD-10-CM | POA: Insufficient documentation

## 2020-03-06 DIAGNOSIS — D472 Monoclonal gammopathy: Secondary | ICD-10-CM | POA: Diagnosis not present

## 2020-03-06 DIAGNOSIS — Z7901 Long term (current) use of anticoagulants: Secondary | ICD-10-CM

## 2020-03-06 DIAGNOSIS — M549 Dorsalgia, unspecified: Secondary | ICD-10-CM | POA: Insufficient documentation

## 2020-03-06 DIAGNOSIS — K219 Gastro-esophageal reflux disease without esophagitis: Secondary | ICD-10-CM | POA: Insufficient documentation

## 2020-03-06 DIAGNOSIS — E785 Hyperlipidemia, unspecified: Secondary | ICD-10-CM | POA: Insufficient documentation

## 2020-03-06 DIAGNOSIS — E669 Obesity, unspecified: Secondary | ICD-10-CM | POA: Insufficient documentation

## 2020-03-06 LAB — CBC WITH DIFFERENTIAL/PLATELET
Abs Immature Granulocytes: 0.11 10*3/uL — ABNORMAL HIGH (ref 0.00–0.07)
Basophils Absolute: 0.1 10*3/uL (ref 0.0–0.1)
Basophils Relative: 0 %
Eosinophils Absolute: 0 10*3/uL (ref 0.0–0.5)
Eosinophils Relative: 0 %
HCT: 40.1 % (ref 36.0–46.0)
Hemoglobin: 12.7 g/dL (ref 12.0–15.0)
Immature Granulocytes: 1 %
Lymphocytes Relative: 46 %
Lymphs Abs: 9.2 10*3/uL — ABNORMAL HIGH (ref 0.7–4.0)
MCH: 30.6 pg (ref 26.0–34.0)
MCHC: 31.7 g/dL (ref 30.0–36.0)
MCV: 96.6 fL (ref 80.0–100.0)
Monocytes Absolute: 1 10*3/uL (ref 0.1–1.0)
Monocytes Relative: 5 %
Neutro Abs: 9.8 10*3/uL — ABNORMAL HIGH (ref 1.7–7.7)
Neutrophils Relative %: 48 %
Platelets: 275 10*3/uL (ref 150–400)
RBC: 4.15 MIL/uL (ref 3.87–5.11)
RDW: 14.4 % (ref 11.5–15.5)
Smear Review: NORMAL
WBC: 20.2 10*3/uL — ABNORMAL HIGH (ref 4.0–10.5)
nRBC: 0 % (ref 0.0–0.2)

## 2020-03-06 LAB — COMPREHENSIVE METABOLIC PANEL
ALT: 15 U/L (ref 0–44)
AST: 17 U/L (ref 15–41)
Albumin: 3.5 g/dL (ref 3.5–5.0)
Alkaline Phosphatase: 93 U/L (ref 38–126)
Anion gap: 9 (ref 5–15)
BUN: 23 mg/dL (ref 8–23)
CO2: 30 mmol/L (ref 22–32)
Calcium: 9.2 mg/dL (ref 8.9–10.3)
Chloride: 103 mmol/L (ref 98–111)
Creatinine, Ser: 1.16 mg/dL — ABNORMAL HIGH (ref 0.44–1.00)
GFR, Estimated: 48 mL/min — ABNORMAL LOW (ref >60)
Glucose, Bld: 119 mg/dL — ABNORMAL HIGH (ref 70–99)
Potassium: 3.8 mmol/L (ref 3.5–5.1)
Sodium: 142 mmol/L (ref 135–145)
Total Bilirubin: 0.3 mg/dL (ref 0.3–1.2)
Total Protein: 6.4 g/dL — ABNORMAL LOW (ref 6.5–8.1)

## 2020-03-06 LAB — LACTATE DEHYDROGENASE: LDH: 159 U/L (ref 98–192)

## 2020-03-06 NOTE — Progress Notes (Signed)
No new changes noted today 

## 2020-03-07 LAB — PROTEIN ELECTROPHORESIS, SERUM
A/G Ratio: 1.1 (ref 0.7–1.7)
Albumin ELP: 3 g/dL (ref 2.9–4.4)
Alpha-1-Globulin: 0.3 g/dL (ref 0.0–0.4)
Alpha-2-Globulin: 1 g/dL (ref 0.4–1.0)
Beta Globulin: 1.2 g/dL (ref 0.7–1.3)
Gamma Globulin: 0.4 g/dL (ref 0.4–1.8)
Globulin, Total: 2.8 g/dL (ref 2.2–3.9)
Total Protein ELP: 5.8 g/dL — ABNORMAL LOW (ref 6.0–8.5)

## 2020-03-23 DIAGNOSIS — M47816 Spondylosis without myelopathy or radiculopathy, lumbar region: Secondary | ICD-10-CM | POA: Diagnosis not present

## 2020-03-23 DIAGNOSIS — M5136 Other intervertebral disc degeneration, lumbar region: Secondary | ICD-10-CM | POA: Diagnosis not present

## 2020-03-23 DIAGNOSIS — M5416 Radiculopathy, lumbar region: Secondary | ICD-10-CM | POA: Diagnosis not present

## 2020-03-23 DIAGNOSIS — M4726 Other spondylosis with radiculopathy, lumbar region: Secondary | ICD-10-CM | POA: Diagnosis not present

## 2020-04-03 DIAGNOSIS — I509 Heart failure, unspecified: Secondary | ICD-10-CM | POA: Diagnosis not present

## 2020-04-03 DIAGNOSIS — I2510 Atherosclerotic heart disease of native coronary artery without angina pectoris: Secondary | ICD-10-CM | POA: Diagnosis not present

## 2020-04-03 DIAGNOSIS — G4733 Obstructive sleep apnea (adult) (pediatric): Secondary | ICD-10-CM | POA: Diagnosis not present

## 2020-04-03 DIAGNOSIS — N183 Chronic kidney disease, stage 3 unspecified: Secondary | ICD-10-CM | POA: Diagnosis not present

## 2020-04-03 DIAGNOSIS — I2699 Other pulmonary embolism without acute cor pulmonale: Secondary | ICD-10-CM | POA: Diagnosis not present

## 2020-04-03 DIAGNOSIS — I10 Essential (primary) hypertension: Secondary | ICD-10-CM | POA: Diagnosis not present

## 2020-04-03 DIAGNOSIS — R0600 Dyspnea, unspecified: Secondary | ICD-10-CM | POA: Diagnosis not present

## 2020-04-03 DIAGNOSIS — Z23 Encounter for immunization: Secondary | ICD-10-CM | POA: Diagnosis not present

## 2020-04-03 DIAGNOSIS — E782 Mixed hyperlipidemia: Secondary | ICD-10-CM | POA: Diagnosis not present

## 2020-04-17 DIAGNOSIS — M5416 Radiculopathy, lumbar region: Secondary | ICD-10-CM | POA: Diagnosis not present

## 2020-04-17 DIAGNOSIS — M5136 Other intervertebral disc degeneration, lumbar region: Secondary | ICD-10-CM | POA: Diagnosis not present

## 2020-04-23 DIAGNOSIS — Z79899 Other long term (current) drug therapy: Secondary | ICD-10-CM | POA: Insufficient documentation

## 2020-04-23 DIAGNOSIS — M899 Disorder of bone, unspecified: Secondary | ICD-10-CM | POA: Insufficient documentation

## 2020-04-23 DIAGNOSIS — N949 Unspecified condition associated with female genital organs and menstrual cycle: Secondary | ICD-10-CM | POA: Insufficient documentation

## 2020-04-23 DIAGNOSIS — G894 Chronic pain syndrome: Secondary | ICD-10-CM | POA: Insufficient documentation

## 2020-04-23 DIAGNOSIS — Z789 Other specified health status: Secondary | ICD-10-CM | POA: Insufficient documentation

## 2020-04-23 NOTE — Progress Notes (Signed)
Patient: Jamie Phillips  Service Category: E/M  Provider: Gaspar Cola, MD  DOB: 1940/08/09  DOS: 04/24/2020  Referring Provider: Harvest Dark, FNP  MRN: [id number]  Setting: Ambulatory outpatient  PCP: Jamie Ruths, MD  Type: New Patient  Specialty: Interventional Pain Management    Location: Office  Delivery: Face-to-face     Primary Reason(s) for Visit: Encounter for initial evaluation of one or more chronic problems (new to examiner) potentially causing chronic pain, and posing a threat to normal musculoskeletal function. (Level of risk: High) CC: Back Pain (lower)  HPI  Jamie Phillips is a 80 y.o. year old, female patient, who comes for the first time to our practice referred by Meeler, Sherren Kerns, FNP for our initial evaluation of her chronic pain. She has Essential hypertension; Hyperlipidemia; Pain in the chest; SOB (shortness of breath); Acute bronchitis; Acute respiratory failure (HCC); CLL (chronic lymphocytic leukemia) ([place]); Chronic pulmonary embolism ([place]); History of DVT in adulthood; DJD (degenerative joint disease); Chronic lower extremity pain (L1/L2 dermatome) (Bilateral); Venous insufficiency; Calculus of gallbladder without cholecystitis without obstruction; Right lower quadrant pain; COVID-19; Monoclonal gammopathy; Other fatigue; Chronic anticoagulation (Eliquis); Adnexal cyst; DDD (degenerative disc disease), lumbosacral; Depression, major, in remission ([place]); Generalized OA; GERD without esophagitis; Healthcare maintenance; History of Bell's palsy; Hyperglycemia, unspecified; 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[place]); Morbid obesity with BMI of 40.0-44.9, adult ([place]); OSA on CPAP; Pes anserine bursitis; Pure hypercholesterolemia; RLS (restless legs syndrome); Walker as ambulation aid; Chronic pain syndrome; Pharmacologic therapy; Disorder of skeletal system; Problems influencing health status; Chronic low back pain (2ry area of Pain) (Bilateral) w/ sciatica  (Bilateral); Lumbosacral radiculopathy at L1 (Bilateral); Lumbosacral radiculopathy at L5 (Left); Lumbosacral radiculopathy at S1 (Right); 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); Abnormal MRI, lumbar spine (01/24/2020); Lumbar facet joint syndrome (Multilevel) (Bilateral); Lumbar facet hypertrophy (Multilevel) (Bilateral); Severe muscle deconditioning; Lower extremity weakness (Bilateral); Lumbosacral facet hypertrophy (Multilevel) (Bilateral); Lumbar scoliosis (idiopathic); Spondylolisthesis of lumbosacral region (Multilevel); Retrolisthesis (L1/L2); Anterolisthesis of lumbar spine (L3/L4, L4/L5); Anterolisthesis of lumbosacral spine (L5/S1); Lumbar lateral recess stenosis (L3-4, L4-5) (Bilateral); Lumbar foraminal stenosis; Synovial cyst of lumbar facet joint (L3-4) (Right); Arthropathy of spinal facet joint concurrent with and due to effusion (L3-4); and Annular tear of lumbar disc (L3-4) (Left) on their problem list. Today she comes in for evaluation of her Back Pain (lower)  Pain Assessment: Location: Right,Left Back Radiating: pain radiaties down front of her leg Onset: More than a month ago Duration: Chronic pain Quality: Sharp,Aching,Burning,Shooting Severity: 10-Worst pain ever/10 (subjective, self-reported pain score)  Effect on ADL: limits my daily activities Timing: Constant Modifying factors: sit down and cry BP: 135/90  HR: 71  Onset and Duration: Gradual and Present longer than 3 months Cause of pain: Unknown Severity: Getting worse, NAS-11 at its worse: 10/10, NAS-11 at its best: 3/10, NAS-11 now: 5/10 and NAS-11 on the average: 5/10 Timing: Not influenced by the time of the day Aggravating Factors: Motion, Walking, Walking uphill and Walking downhill Alleviating Factors: Lying down, Resting and Sitting Associated Problems: Pain that wakes patient up and Pain that does not allow patient to sleep Quality of Pain: Constant and Sharp Previous  Examinations or Tests: MRI scan Previous Treatments: Epidural steroid injections, Facet blocks and Radiofrequency, Medications  Morbidly obese 80 year old, white female patient with medical history significant for stage III chronic kidney disease, depression, GERD, obstructive sleep apnea on CPAP, currently anticoagulated on Eliquis secondary to history of DVT and pulmonary embolism, with referred  to our practice after having been referred to Jamie Phillips for multiple unsuccessful interventional therapies.  The patient has failed several medication trials including tramadol, Zanaflex, baclofen, Celebrex, Lidoderm, as well as physical therapy, injection therapy, and radiofrequency ablation.  The patient also has a history of having experienced hallucinations with the use of oxycodone and morphine and therefore does not want to be placed on any medications that could be addictive.  The patient indicated that she was told by Jamie Phillips that she could never come off of her blood thinner (Eliquis), however she has stopped that for previous of 3 days prior to interventional therapies done by Jamie Phillips.  The patient refers having been told that her condition is nonoperable.  She also indicates not having any desire to undergo surgery.  The patient describes her pain as a 10/10 and she indicates that initially the worst pain was the lower back but after the radiofrequency ablation now it is the legs.  She refers that she had her hopes high for the radiofrequency, but according to her it did not work.  Currently the patient's primary area of pain is that of the lower extremities, bilaterally.  The patient indicates that the pain is equally as bad on both legs.  This pain is located primarily in the area of the groin and upper leg and what appears to be the distribution of L1.  She denies any surgeries in the area where she is experiencing the pain but she does admit to having undergone bilateral  total knee replacement by Jamie Phillips with the left side having been done first and 6 months later the right side.  The total knee replacement was done around 2017.  She denies any hip pain but she does admit to some intermittent electrical-like sensations on the left leg between the big toe and the second toe and what appears to be an L5 or deep peroneal distribution.  In the case of the right lower extremity she also experiences intermittent electrical-like sensations that primarily affect the distal lateral aspect of her foot and the last 3 toes in what appears to be the distribution of the sural lateral dorsal cutaneous nerve (S1/S2).  The patient's secondary area of pain is that of the lower back, bilaterally.  She refers that the pain is equally as bad on both sides.  She denies any prior surgery, but does admit to having had physical therapy and a recent MRI of the lumbar spine.  She has had multiple nerve blocks done by Jamie Phillips, including a radiofrequency ablation.  None of them seem to have helped, according to the patient.  Below is a list of the procedures done by Jamie Phillips.  Procedures by Chasnis: 02/23/2020: RF neurotomy to the bilateral L3-4 and L4-5 facet joints (no relief) (bilateral L3, L4 medial branch and L5 dorsal rami) 12/16/2019: Bilateral L4-5 transforaminal ESI (ODI: 64%, one day of relief then return of pain) (Oswestry Disability Index) (L4) 11/19/2019: Bilateral L5-S1 transforaminal ESI (ODI 64%, no relief) (L5) 10/06/2019: MBB to the bilateral L3-4 and L4-5 facet joints (8/10 to 1/10, continued relief of back pain new pain radiating to the bilateral posterior thighs and occasional anterior thighs) (Bilateral L2, L3 and L4 medial branch block) 08/05/2019: MBB to the bilateral L3-4 and L4-5 facet joints (10/10 to 3/10) (Bilateral L2, L3 and L4 medial branch block) 06/01/2019: Bilateral L3-4 and L4-5 facet joint injections (good relief) (L3-4 and L4-5  intra-articular injection) 06/09/2018: Bilateral L5-S1  transforaminal ESI (mild relief for 3 days) 05/11/2018: Bilateral L5-S1 transforaminal ESI (30% relief x7 to 10 days) (L5)    Today I took the time to provide the patient with information regarding my pain practice. The patient was informed that my practice is divided into two sections: an interventional pain management section, as well as a completely separate and distinct medication management section. I explained that I have procedure days for my interventional therapies, and evaluation days for follow-ups and medication management. Because of the amount of documentation required during both, they are kept separated. This means that there is the possibility that she may be scheduled for a procedure on one day, and medication management the next. I have also informed her that because of staffing and facility limitations, I no longer take patients for medication management only. To illustrate the reasons for this, I gave the patient the example of surgeons, and how inappropriate it would be to refer a patient to his/her care, just to write for the post-surgical antibiotics on a surgery done by a different surgeon.   Because interventional pain management is my board-certified specialty, the patient was informed that joining my practice means that they are open to any and all interventional therapies. I made it clear that this does not mean that they will be forced to have any procedures done. What this means is that I believe interventional therapies to be essential part of the diagnosis and proper management of chronic pain conditions. Therefore, patients not interested in these interventional alternatives will be better served under the care of a different practitioner.  The patient was also made aware of my Comprehensive Pain Management Safety Guidelines where by joining my practice, they limit all of their nerve blocks and joint injections to  those done by our practice, for as long as we are retained to manage their care.   Historic Controlled Substance Pharmacotherapy Review  PMP and historical list of controlled substances: Tramadol 50 mg, 1 tab p.o. 4 times daily; hydrocodone/APAP 10/325, 1 tab p.o. twice daily; hydrocodone/APAP 7.5/325 1 tab p.o. twice daily; hydrocodone/APAP 5/325 1 tab p.o. twice daily Current opioid analgesics:  Tramadol 50 mg, 1 tab p.o. 4 times daily (200 mg/day of tramadol) MME/day: 20 mg/day  Historical Monitoring: The patient  reports no history of drug use. List of all UDS Test(s): No results found for: MDMA, COCAINSCRNUR, [place], [place], CANNABQUANT, THCU, [place] List of other Serum/Urine Drug Screening Test(s):  No results found for: AMPHSCRSER, BARBSCRSER, BENZOSCRSER, COCAINSCRSER, COCAINSCRNUR, PCPSCRSER, PCPQUANT, THCSCRSER, THCU, CANNABQUANT, OPIATESCRSER, OXYSCRSER, PROPOXSCRSER, ETH Historical Background Evaluation: [place] PMP: PDMP reviewed during this encounter. Online review of the past 95-monthperiod conducted.             PMP NARX Score Report:  Narcotic: 290 Sedative: 150 Stimulant: 000 [place] Department of public safety, offender search: (Editor, commissioningInformation) Non-contributory Risk Assessment Profile: Aberrant behavior: None observed or detected today Risk factors for fatal opioid overdose: None identified today PMP NARX Overdose Risk Score: 260 Fatal overdose hazard ratio (HR): Calculation deferred Non-fatal overdose hazard ratio (HR): Calculation deferred Risk of opioid abuse or dependence: 0.7-3.0% with doses ? 36 MME/day and 6.1-26% with doses ? 120 MME/day. Substance use disorder (SUD) risk level: See below Personal History of Substance Abuse (SUD-Substance use disorder):  Alcohol: Negative  Illegal Drugs: Negative  Rx Drugs: Negative  ORT Risk Level calculation: Low Risk  Opioid Risk Tool - 04/24/20 0853      Family History of Substance Abuse  Alcohol Negative    Illegal  Drugs Positive Female    Rx Drugs Negative      Personal History of Substance Abuse   Alcohol Negative    Illegal Drugs Negative    Rx Drugs Negative      Age   Age between 56-45 years  No      History of Preadolescent Sexual Abuse   History of Preadolescent Sexual Abuse Negative or Female      Psychological Disease   Psychological Disease Negative    Depression Negative      Total Score   Opioid Risk Tool Scoring 2    Opioid Risk Interpretation Low Risk          ORT Scoring interpretation table:  Score <3 = Low Risk for SUD  Score between 4-7 = Moderate Risk for SUD  Score >8 = High Risk for Opioid Abuse   PHQ-2 Depression Scale:  Total score:    PHQ-2 Scoring interpretation table: (Score and probability of major depressive disorder)  Score 0 = No depression  Score 1 = 15.4% Probability  Score 2 = 21.1% Probability  Score 3 = 38.4% Probability  Score 4 = 45.5% Probability  Score 5 = 56.4% Probability  Score 6 = 78.6% Probability   PHQ-9 Depression Scale:  Total score:    PHQ-9 Scoring interpretation table:  Score 0-4 = No depression  Score 5-9 = Mild depression  Score 10-14 = Moderate depression  Score 15-19 = Moderately severe depression  Score 20-27 = Severe depression (2.4 times higher risk of SUD and 2.89 times higher risk of overuse)   Pharmacologic Plan: As per protocol, I have not taken over any controlled substance management, pending the results of ordered tests and/or consults.            Initial impression: Pending review of available data and ordered tests.  Meds   Current Outpatient Medications:  .  acetaminophen (TYLENOL) 650 MG CR tablet, Take 650-1,300 mg by mouth every 8 (eight) hours as needed for pain. TYLENOL ARTHRITIS, Disp: , Rfl:  .  albuterol (PROVENTIL HFA;VENTOLIN HFA) 108 (90 Base) MCG/ACT inhaler, Inhale 2 puffs into the lungs every 6 (six) hours as needed for wheezing or shortness of breath., Disp: , Rfl:  .  cholestyramine  (QUESTRAN) 4 g packet, Take 4 g by mouth 3 (three) times daily with meals., Disp: , Rfl:  .  DULoxetine (CYMBALTA) 30 MG capsule, Take 30 mg by mouth daily., Disp: , Rfl:  .  ELIQUIS 5 MG TABS tablet, TAKE 1 TABLET TWICE DAILY (Patient taking differently: Take 5 mg by mouth 2 (two) times daily.), Disp: 180 tablet, Rfl: 6 .  escitalopram (LEXAPRO) 10 MG tablet, Take 10 mg by mouth daily., Disp: , Rfl:  .  gabapentin (NEURONTIN) 300 MG capsule, Take 300 mg by mouth 2 (two) times daily. , Disp: , Rfl:  .  losartan-hydrochlorothiazide (HYZAAR) 100-12.5 MG tablet, Take 1 tablet by mouth daily. Reported on 08/22/2015, Disp: , Rfl:  .  Multiple Vitamins-Minerals (PRESERVISION AREDS 2 PO), Take 1 capsule by mouth 2 (two) times daily. , Disp: , Rfl:  .  pravastatin (PRAVACHOL) 40 MG tablet, Take 40 mg by mouth daily. , Disp: , Rfl:  .  sucralfate (CARAFATE) 1 g tablet, Take 1 g by mouth 4 (four) times daily -  with meals and at bedtime., Disp: , Rfl:  .  pantoprazole (PROTONIX) 40 MG tablet, Take 1 tablet (40 mg total) by mouth 2 (  two) times daily for 14 days. (Patient taking differently: Take 40 mg by mouth daily. ), Disp: 28 tablet, Rfl: 0  Imaging Review  Lumbosacral Imaging: Lumbar MR wo contrast: Results for orders placed during the hospital encounter of 01/24/20 MR LUMBAR SPINE WO CONTRAST  Narrative CLINICAL DATA:  Initial evaluation for lower back pain with extension into the lower extremities with associated tingling.  EXAM: MRI LUMBAR SPINE WITHOUT CONTRAST  TECHNIQUE: Multiplanar, multisequence MR imaging of the lumbar spine was performed. No intravenous contrast was administered.  COMPARISON:  Prior MRI from 04/10/2018.  FINDINGS: Segmentation: Standard. Lowest well-formed disc space labeled the L5-S1 level.  Alignment: Scoliosis. Trace retrolisthesis of L1 on L2. 5 mm anterolisthesis of L3 on L4 with 6 mm anterolisthesis of L4 on L5, and trace anterolisthesis of L5 on S1.  Findings chronic and facet mediated.  Vertebrae: Vertebral body height maintained without acute or chronic fracture. Bone marrow signal intensity diffusely heterogeneous but within normal limits. Few scattered benign hemangiomata noted. No worrisome osseous lesions. No abnormal marrow edema.  Conus medullaris and cauda equina: Conus extends to the T12-L1 level. Conus and cauda equina appear normal.  Paraspinal and other soft tissues: Paraspinous soft tissues demonstrate no acute finding. Few scattered subcentimeter simple cyst noted within the right kidney. Fibroid uterus noted. 2.9 cm left adnexal cyst partially visualized (series 7, image 16), indeterminate.  Disc levels:  T12-L1: Disc desiccation with mild disc bulge. Mild to moderate left greater than right facet hypertrophy. No stenosis.  L1-2: Diffuse disc bulge with disc desiccation. Moderate bilateral facet hypertrophy. No significant spinal stenosis. Foramina remain patent. Appearance is relatively stable.  L2-3: Mild disc bulge with disc desiccation. Moderate bilateral facet hypertrophy. Resultant mild spinal stenosis. Foramina remain patent. Appearance is stable.  L3-4: 5 mm anterolisthesis. Associated broad posterior pseudo disc bulge/uncovering, asymmetric to the left. Associated left foraminal to extraforaminal annular fissure. Severe bilateral facet degeneration with associated reactive joint effusions. Tiny 4 mm synovial cyst at the anterior aspect of the right L3-4 facet (series 5, image 9). Resultant moderate to severe canal with bilateral subarticular stenosis, relatively stable from previous. Foramina remain patent.  L4-5: 6 mm anterolisthesis. Associated disc desiccation with mild disc bulge, asymmetric to the right. Severe right worse than left facet degeneration. Resultant severe canal with right worse than left lateral recess stenosis. Appearance is relatively stable. Mild to moderate right L4  foraminal narrowing, also unchanged. Left neural foramina remains patent.  L5-S1: Disc desiccation with minimal disc bulge. Severe left with moderate right facet hypertrophy. No significant canal or lateral recess stenosis. Foramina remain patent. Appearance is stable.  IMPRESSION: 1. Stable multilevel lumbar spondylosis, most pronounced at L3-4 and L4-5 where there is resultant moderate to severe canal and bilateral subarticular stenosis, with mild to moderate right L4 foraminal narrowing. 2. Fibroid uterus. 3. 2.9 cm left adnexal cyst, indeterminate. Further evaluation with dedicated pelvic ultrasound recommended for further evaluation.   Electronically Signed By: Jeannine Boga M.D. On: 01/24/2020 20:32  Knee Imaging: Knee-L MR w contrast: Results for orders placed during the hospital encounter of 11/16/15 MR Knee Left  Wo Contrast  Narrative CLINICAL DATA:  Left knee pain and swelling for 6 months. No known injury. Osteoarthritis. EXAM: MRI OF THE LEFT KNEE WITHOUT CONTRAST TECHNIQUE: Multiplanar, multisequence MR imaging of the knee was performed. No intravenous contrast was administered. COMPARISON:  MRI of the left lower leg 08/11/2015. FINDINGS: MENISCI Medial meniscus: A large horizontal tear is seen throughout the posterior horn  reaching the meniscal undersurface. Marked blunting along the free edge is also identified. Complex tearing is present throughout a markedly diminutive and degenerated body. The anterior horn appears preserved. Lateral meniscus:  Intact. LIGAMENTS Cruciates:  Intact. Collaterals:  Intact. CARTILAGE Patellofemoral:  Mildly degenerated. Medial: Markedly thinned throughout with associated joint space narrowing. Lateral:  Mild to moderately degenerated. Joint:  Small joint effusion. Popliteal Fossa: Baker's cyst with septations and some debris measures 2.6 cm transverse by 2.3 cm AP by 7.5 cm craniocaudal. Extensor Mechanism:   Intact. Bones: No fracture or worrisome marrow lesion. Osteophytosis is most prominent about the medial compartment and there is some subchondral edema about the medial compartment. Mildly decreased T1 signal in all imaged bones is consistent with marrow reactivation due to obesity and possibly smoking. Other: None IMPRESSION: Medial compartment osteoarthritis with associated horizontal tearing throughout the posterior horn of the medial meniscus and complex degenerative tearing throughout the meniscal body. Large Baker's cyst with some septations and debris. Electronically Signed By: Inge Rise M.D. On: 11/16/2015 08:44  Complexity Note: Imaging results reviewed. Results shared with Ms. Hardin Negus, using [employer].                        ROS  Cardiovascular: Blood thinners:  Antiplatelet Pulmonary or Respiratory: Lung problems, hx of PE Neurological: No reported neurological signs or symptoms such as seizures, abnormal skin sensations, urinary and/or fecal incontinence, being born with an abnormal open spine and/or a tethered spinal cord Psychological-Psychiatric: No reported psychological or psychiatric signs or symptoms such as difficulty sleeping, anxiety, depression, delusions or hallucinations (schizophrenial), mood swings (bipolar disorders) or suicidal ideations or attempts Gastrointestinal: No reported gastrointestinal signs or symptoms such as vomiting or evacuating blood, reflux, heartburn, alternating episodes of diarrhea and constipation, inflamed or scarred liver, or pancreas or irrregular and/or infrequent bowel movements Genitourinary: No reported renal or genitourinary signs or symptoms such as difficulty voiding or producing urine, peeing blood, non-functioning kidney, kidney stones, difficulty emptying the bladder, difficulty controlling the flow of urine, or chronic kidney disease Hematological: Positive for taking the following blood thinner: Eliquis and No  reported hematological signs or symptoms such as prolonged bleeding, low or poor functioning platelets, bruising or bleeding easily, hereditary bleeding problems, low energy levels due to low hemoglobin or being anemic, hx of Leukemia CLL Endocrine: No reported endocrine signs or symptoms such as high or low blood sugar, rapid heart rate due to high thyroid levels, obesity or weight gain due to slow thyroid or thyroid disease Rheumatologic: No reported rheumatological signs and symptoms such as fatigue, joint pain, tenderness, swelling, redness, heat, stiffness, decreased range of motion, with or without associated rash Musculoskeletal: Negative for myasthenia gravis, muscular dystrophy, multiple sclerosis or malignant hyperthermia Work History: Retired  Allergies  Ms. Nadeau is allergic to oxycodone and morphine.  Laboratory Chemistry Profile   Renal Lab Results  Component Value Date   BUN 23 03/06/2020   CREATININE 1.16 (H) 03/06/2020   GFRAA 55 (L) 09/03/2019   GFRNONAA 48 (L) 03/06/2020   PROTEINUR 30 (A) 12/13/2018     Electrolytes Lab Results  Component Value Date   NA 142 03/06/2020   K 3.8 03/06/2020   CL 103 03/06/2020   CALCIUM 9.2 03/06/2020   MG 1.9 12/18/2018     Hepatic Lab Results  Component Value Date   AST 17 03/06/2020   ALT 15 03/06/2020   ALBUMIN 3.5 03/06/2020   ALKPHOS 93 03/06/2020  LIPASE 37 10/09/2018     ID Lab Results  Component Value Date   SARSCOV2NAA Detected (A) 11/30/2018   STAPHAUREUS NEGATIVE 10/10/2018   MRSAPCR NEGATIVE 10/10/2018     Bone No results found for: VD25OH, [id number], [id number], [id number], 25OHVITD1, 25OHVITD2, 25OHVITD3, TESTOFREE, TESTOSTERONE   Endocrine Lab Results  Component Value Date   GLUCOSE 119 (H) 03/06/2020   GLUCOSEU NEGATIVE 12/13/2018   TSH 1.304 09/03/2019     Neuropathy No results found for: VITAMINB12, FOLATE, HGBA1C, HIV   CNS No results found for: COLORCSF, APPEARCSF, RBCCOUNTCSF,  WBCCSF, POLYSCSF, LYMPHSCSF, EOSCSF, PROTEINCSF, GLUCCSF, JCVIRUS, CSFOLI, IGGCSF, LABACHR, ACETBL, LABACHR, ACETBL   Inflammation (CRP: Acute  ESR: Chronic) Lab Results  Component Value Date   CRP 1.4 (H) 12/20/2018   LATICACIDVEN 0.9 12/13/2018     Rheumatology No results found for: RF, ANA, LABURIC, URICUR, LYMEIGGIGMAB, LYMEABIGMQN, HLAB27   Coagulation Lab Results  Component Value Date   INR 1.3 (H) 10/12/2018   LABPROT 15.9 (H) 10/12/2018   APTT 62 (H) 10/13/2018   PLT 275 03/06/2020   DDIMER 1.45 (H) 12/18/2018     Cardiovascular Lab Results  Component Value Date   BNP 298.5 (H) 12/18/2018   TROPONINI <0.03 11/03/2016   HGB 12.7 03/06/2020   HCT 40.1 03/06/2020     Screening Lab Results  Component Value Date   SARSCOV2NAA Detected (A) 11/30/2018   STAPHAUREUS NEGATIVE 10/10/2018   MRSAPCR NEGATIVE 10/10/2018     Cancer No results found for: CEA, CA125, LABCA2   Allergens No results found for: ALMOND, APPLE, ASPARAGUS, AVOCADO, BANANA, BARLEY, BASIL, BAYLEAF, GREENBEAN, LIMABEAN, WHITEBEAN, BEEFIGE, REDBEET, BLUEBERRY, BROCCOLI, CABBAGE, MELON, CARROT, CASEIN, CASHEWNUT, CAULIFLOWER, CELERY     Note: Lab results reviewed.  [place]  Drug: Ms. Cobern  reports no history of drug use. Alcohol:  reports no history of alcohol use. Tobacco:  reports that she has never smoked. She has never used smokeless tobacco. 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 Family: family history includes Congestive Heart Failure in her father; Heart failure in her father; Pulmonary embolism in her mother.  Past Surgical History:  Procedure Laterality Date  . APPENDECTOMY    . BREAST BIOPSY Left 08/19/04   lt bx/clip-neg  . COLONOSCOPY    . COLONOSCOPY WITH PROPOFOL N/A 03/10/2017   Procedure: COLONOSCOPY WITH PROPOFOL;  Surgeon: [name], MD;  Location: [facility] ENDOSCOPY;  Service: Endoscopy;  Laterality: N/A;  . ESOPHAGOGASTRODUODENOSCOPY (EGD) WITH PROPOFOL N/A 02/02/2019   Procedure: ESOPHAGOGASTRODUODENOSCOPY (EGD) WITH PROPOFOL;  Surgeon: Toledo, Benay Pike, MD;  Location: [facility] ENDOSCOPY;  Service: Endoscopy;  Laterality: N/A;  . IVC FILTER INSERTION N/A 06/11/2016   Procedure: IVC Filter Insertion;  Surgeon: Katha Cabal, MD;  Location: [place] CV LAB;  Service: Cardiovascular;  Laterality: N/A;  . IVC FILTER REMOVAL N/A 05/06/2017   Procedure: IVC FILTER REMOVAL;  Surgeon: Katha Cabal, MD;  Location: [place] CV LAB;  Service: Cardiovascular;  Laterality: N/A;  . JOINT REPLACEMENT Left 06/17/2016  . LAPAROSCOPIC APPENDECTOMY N/A 10/10/2018   Procedure: APPENDECTOMY LAPAROSCOPIC ATTEMPTED CONVERTED TO OPEN;  Surgeon: Herbert Pun, MD;  Location: [facility] ORS;  Service: General;  Laterality: N/A;  . LAPAROSCOPY N/A 10/10/2018   Procedure: LAPAROSCOPY DIAGNOSTIC ATTEMPTED CONVERTED TO OPEN;  Surgeon: Herbert Pun, MD;  Location: [facility] ORS;  Service: General;  Laterality: N/A;  . neck fusion    . PARTIAL COLECTOMY Right 10/10/2018   Procedure: PARTIAL COLECTOMY;  Surgeon: Herbert Pun, MD;  Location: [facility] ORS;  Service: General;  Laterality: Right;  . PERIPHERAL VASCULAR CATHETERIZATION N/A 08/15/2015   Procedure: pulmonary angiogram with lysis;  Surgeon: Katha Cabal, MD;  Location: [place] CV LAB;  Service: Cardiovascular;  Laterality: N/A;  . WRIST SURGERY Left 2000   Active  Ambulatory Problems    Diagnosis Date Noted  . Essential hypertension 05/04/2015  . Hyperlipidemia 05/04/2015  . Pain in the chest 05/04/2015  . SOB (shortness of breath) 05/04/2015  . Acute bronchitis 05/11/2015  . Acute respiratory failure ([place]) 08/14/2015  . CLL (chronic lymphocytic leukemia) ([place]) 01/23/2014  . Chronic pulmonary embolism ([place]) 08/25/2015  . History of DVT in adulthood 03/04/2016  . DJD (degenerative joint disease) 03/04/2016  . Chronic lower extremity pain (L1/L2 dermatome) (Bilateral) 03/04/2016  . Venous insufficiency 03/04/2016  . Calculus of gallbladder without cholecystitis without obstruction 08/27/2016  . Right lower quadrant pain 10/09/2018  . COVID-19 12/14/2018  . Monoclonal gammopathy 03/04/2019  . Other fatigue 03/07/2019  . Chronic anticoagulation (Eliquis) 03/06/2020  . Adnexal cyst 04/23/2020  . DDD (degenerative disc disease), lumbosacral 03/27/2018  . Depression, major, in remission ([place]) 02/06/2014  . Generalized OA 01/23/2014  . GERD without esophagitis 01/23/2014  . Healthcare maintenance 08/06/2016  . History of Bell's palsy 01/23/2014  . Hyperglycemia, unspecified 10/13/2014  . 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 Morbid obesity with BMI of 40.0-44.9, adult ([place]) 01/30/2017  . OSA on CPAP 01/23/2014  . Pes anserine bursitis 01/30/2016  . Pure hypercholesterolemia 01/23/2014  . RLS (restless legs syndrome) 10/07/2017  . Walker as ambulation aid 11/04/2018  . Chronic pain syndrome 04/23/2020  . Pharmacologic therapy 04/23/2020  . Disorder of skeletal system 04/23/2020  . Problems influencing health status 04/23/2020  . Chronic low back pain (2ry area of Pain) (Bilateral) w/ sciatica (Bilateral) 04/24/2020  . Lumbosacral radiculopathy at L1 (Bilateral) 04/24/2020  . Lumbosacral radiculopathy at L5 (Left) 04/24/2020  . Lumbosacral radiculopathy at S1 (Right) 04/24/2020  . 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 Abnormal MRI, lumbar spine (01/24/2020) 04/24/2020  . Lumbar facet joint syndrome (Multilevel) (Bilateral) 04/24/2020  . Lumbar facet hypertrophy (Multilevel) (Bilateral) 04/24/2020  . Severe muscle deconditioning 04/24/2020  . Lower extremity weakness (Bilateral) 04/24/2020  . Lumbosacral facet hypertrophy (Multilevel) (Bilateral) 04/24/2020  . Lumbar scoliosis (idiopathic) 04/24/2020  . Spondylolisthesis of lumbosacral region (Multilevel) 04/24/2020  . Retrolisthesis (L1/L2) 04/24/2020  . Anterolisthesis of lumbar spine (L3/L4, L4/L5) 04/24/2020  . Anterolisthesis of lumbosacral spine (L5/S1) 04/24/2020  . Lumbar lateral recess stenosis (L3-4, L4-5) (Bilateral) 04/24/2020  . Lumbar foraminal stenosis 04/24/2020  . Synovial cyst of lumbar facet joint (L3-4) (Right) 04/24/2020  . Arthropathy of spinal facet joint concurrent with and due to effusion (L3-4) 04/24/2020  . Annular tear of lumbar disc (L3-4) (Left) 04/24/2020   Resolved Ambulatory Problems    Diagnosis Date Noted  . No Resolved Ambulatory Problems   Past Medical History:  Diagnosis Date  . Anginal pain ([place])   . Anxiety   . Arthritis   . Back pain   . Cancer ([place])   .  Chronic kidney disease   . Chronic kidney disease   . Depression   . Epigastric pain   . GERD (gastroesophageal reflux disease)   . Headache   . Hemorrhoids   . Hypercholesteremia   . Hypertension   . 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 Macular degeneration   . MGUS (monoclonal gammopathy of unknown significance)   . Morbid obesity ([place])   . Obesity   . Pulmonary embolism ([place])   . PVD (peripheral vascular disease) ([place])   . Sleep apnea    Constitutional Exam  General appearance: Well nourished, well developed, and well hydrated. In no apparent acute distress Vitals:   04/24/20 0838  BP: 135/90  Pulse: 71  Temp: (!) 97.1 F (36.2 C)  SpO2: 98%  Weight: 275 lb (124.7  kg)  Height: '5\' 5"'  (1.651 m)   BMI Assessment: Estimated body mass index is 45.76 kg/m as calculated from the following:   Height as of this encounter: '5\' 5"'  (1.651 m).   Weight as of this encounter: 275 lb (124.7 kg).  BMI interpretation table: BMI level Category Range association with higher incidence of chronic pain  <18 kg/m2 Underweight   18.5-24.9 kg/m2 Ideal body weight   25-29.9 kg/m2 Overweight Increased incidence by 20%  30-34.9 kg/m2 Obese (Class I) Increased incidence by 68%  35-39.9 kg/m2 Severe obesity (Class II) Increased incidence by 136%  >40 kg/m2 Extreme obesity (Class III) Increased incidence by 254%   Patient's current BMI Ideal Body weight  Body mass index is 45.76 kg/m. Ideal body weight: 57 kg (125 lb 10.6 oz) Adjusted ideal body weight: 84.1 kg (185 lb 6.4 oz)   BMI Readings from Last 4 Encounters:  04/24/20 45.76 kg/m  03/06/20 45.77 kg/m  01/09/20 45.26 kg/m  09/03/19 45.43 kg/m   Wt Readings from Last 4 Encounters:  04/24/20 275 lb (124.7 kg)  03/06/20 275 lb 0.4 oz (124.7 kg)  01/09/20 272 lb (123.4 kg)  09/03/19 273 lb (123.8 kg)    Psych/Mental status: Alert, oriented x 3 (person, place, & time)       Eyes: PERLA Respiratory: No evidence of acute respiratory distress  Assessment  Primary Diagnosis & Pertinent Problem List: The primary encounter diagnosis was Chronic pain syndrome. Diagnoses of Chronic lower extremity pain (L1/L2 dermatome) (Bilateral), Lumbosacral radiculopathy at L1 (Bilateral), Lumbar lateral recess stenosis (L3-4, L4-5) (Bilateral), Lumbosacral radiculopathy at L5 (Left), Lumbosacral radiculopathy at S1 (Right), Lumbar central spinal stenosis with neurogenic claudication, Lumbar foraminal stenosis, Lower extremity weakness (Bilateral), Chronic low back pain (2ry area of Pain) (Bilateral) w/ sciatica (Bilateral), Lumbar facet hypertrophy (Multilevel) (Bilateral), Lumbosacral facet hypertrophy (Multilevel) (Bilateral),  Lumbar facet joint syndrome (Multilevel) (Bilateral), Synovial cyst of lumbar facet joint (L3-4) (Right), Arthropathy of spinal facet joint concurrent with and due to effusion (L3-4), Annular tear of lumbar disc, Spondylolisthesis of lumbosacral region (Multilevel), Retrolisthesis (L1/L2), Anterolisthesis of lumbar spine (L3/L4, L4/L5), Anterolisthesis of lumbosacral spine (L5/S1), Lumbar scoliosis (idiopathic), Other intervertebral disc degeneration, lumbar region, Abnormal MRI, lumbar spine, Severe muscle deconditioning, Pharmacologic therapy, Disorder of skeletal system, Problems influencing health status, Chronic anticoagulation (Eliquis), Chronic pulmonary embolism, unspecified pulmonary embolism type, unspecified whether acute cor pulmonale present ([place]), and History of DVT in adulthood were also pertinent to this visit.  Visit Diagnosis (New problems to examiner): 1. Chronic pain syndrome   2. Chronic lower extremity pain (L1/L2 dermatome) (Bilateral)   3. Lumbosacral radiculopathy at L1 (Bilateral)   4. Lumbar lateral recess stenosis (L3-4, L4-5) (  Bilateral)   5. Lumbosacral radiculopathy at L5 (Left)   6. Lumbosacral radiculopathy at S1 (Right)   7. 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 Lumbar foraminal stenosis   9. Lower extremity weakness (Bilateral)   10. Chronic low back pain (2ry area of Pain) (Bilateral) w/ sciatica (Bilateral)   11. Lumbar facet hypertrophy (Multilevel) (Bilateral)   12. Lumbosacral facet hypertrophy (Multilevel) (Bilateral)   13. Lumbar facet joint syndrome (Multilevel) (Bilateral)   14. Synovial cyst of lumbar facet joint (L3-4) (Right)   15. Arthropathy of spinal facet joint concurrent with and due to effusion (L3-4)   16. Annular tear of lumbar disc   17. Spondylolisthesis of lumbosacral region (Multilevel)   18. Retrolisthesis (L1/L2)   19. Anterolisthesis of lumbar spine (L3/L4, L4/L5)   20. Anterolisthesis of lumbosacral spine  (L5/S1)   21. Lumbar scoliosis (idiopathic)   22. Other intervertebral disc degeneration, lumbar region   23. Abnormal MRI, lumbar spine   24. Severe muscle deconditioning   25. Pharmacologic therapy   26. Disorder of skeletal system   27. Problems influencing health status   28. Chronic anticoagulation (Eliquis)   29. Chronic pulmonary embolism, unspecified pulmonary embolism type, unspecified whether acute cor pulmonale present ([place])   30. History of DVT in adulthood    Plan of Care (Initial workup plan)  Note: Ms. Eatherly was reminded that as per protocol, today's visit has been an evaluation only. We have not taken over the patient's controlled substance management.  Problem-specific plan: No problem-specific Assessment & Plan notes found for this encounter.   Lab Orders     Compliance Drug Analysis, Ur     Comp. Metabolic Panel (12)     Magnesium     Vitamin B12     Sedimentation rate     25-Hydroxy vitamin D Lcms D2+D3     C-reactive protein     Protime-INR     APTT     Platelet count     Platelet function assay     D-dimer, quantitative (not at [facility])  Imaging Orders     CT LUMBAR SPINE W CONTRAST     DG Myelogram Lumbar     DG Lumbar Spine Complete W/Bend Referral Orders  No referral(s) requested today   Procedure Orders    No procedure(s) ordered today   Pharmacotherapy (current): Medications ordered:  No orders of the defined types were placed in this encounter.  Medications administered during this visit: Antonia S. Cutbirth had no medications administered during this visit.   Pharmacological management options:  Opioid Analgesics: The patient was informed that there is no guarantee that she would be a candidate for opioid analgesics. The decision will be made following CDC guidelines. This decision will be based on the results of diagnostic studies, as well as Ms. Wasilewski's risk profile.   Membrane stabilizer: To be determined at a later time  Muscle  relaxant: To be determined at a later time  NSAID: To be determined at a later time  Other analgesic(s): To be determined at a later time   Interventional management options: Ms. Biernat was informed that there is no guarantee that she would be a candidate for interventional therapies. The decision will be based on the results of diagnostic studies, as well as Ms. Picklesimer's risk profile.  Procedure(s) under consideration:  Diagnostic midline T12-L1 LESI    Provider-requested follow-up: Return for (F2F), (2V) post-test eval (40 min), (s/p Tests) after CT myelogram, (Blood Thinner Protocol).  Future Appointments  Date Time Provider [place]  09/04/2020  9:45 AM [facility] None  09/04/2020 10:15 AM Mike Gip, Drue Second, MD [facility] None    Note by: Jamie Cola, MD Date: 04/24/2020; Time: 3:19 PM

## 2020-04-24 ENCOUNTER — Other Ambulatory Visit: Payer: Self-pay

## 2020-04-24 ENCOUNTER — Encounter: Payer: Self-pay | Admitting: Pain Medicine

## 2020-04-24 ENCOUNTER — Telehealth: Payer: Self-pay

## 2020-04-24 ENCOUNTER — Ambulatory Visit: Payer: Medicare HMO | Attending: Pain Medicine | Admitting: Pain Medicine

## 2020-04-24 VITALS — BP 135/90 | HR 71 | Temp 97.1°F | Ht 65.0 in | Wt 275.0 lb

## 2020-04-24 DIAGNOSIS — M5441 Lumbago with sciatica, right side: Secondary | ICD-10-CM

## 2020-04-24 DIAGNOSIS — R937 Abnormal findings on diagnostic imaging of other parts of musculoskeletal system: Secondary | ICD-10-CM | POA: Insufficient documentation

## 2020-04-24 DIAGNOSIS — M5136 Other intervertebral disc degeneration, lumbar region: Secondary | ICD-10-CM

## 2020-04-24 DIAGNOSIS — M48062 Spinal stenosis, lumbar region with neurogenic claudication: Secondary | ICD-10-CM | POA: Diagnosis not present

## 2020-04-24 DIAGNOSIS — M899 Disorder of bone, unspecified: Secondary | ICD-10-CM

## 2020-04-24 DIAGNOSIS — M47816 Spondylosis without myelopathy or radiculopathy, lumbar region: Secondary | ICD-10-CM | POA: Diagnosis not present

## 2020-04-24 DIAGNOSIS — M5417 Radiculopathy, lumbosacral region: Secondary | ICD-10-CM | POA: Diagnosis not present

## 2020-04-24 DIAGNOSIS — M47817 Spondylosis without myelopathy or radiculopathy, lumbosacral region: Secondary | ICD-10-CM

## 2020-04-24 DIAGNOSIS — M79604 Pain in right leg: Secondary | ICD-10-CM

## 2020-04-24 DIAGNOSIS — G8929 Other chronic pain: Secondary | ICD-10-CM

## 2020-04-24 DIAGNOSIS — M7138 Other bursal cyst, other site: Secondary | ICD-10-CM | POA: Diagnosis present

## 2020-04-24 DIAGNOSIS — Z86718 Personal history of other venous thrombosis and embolism: Secondary | ICD-10-CM

## 2020-04-24 DIAGNOSIS — Z79899 Other long term (current) drug therapy: Secondary | ICD-10-CM

## 2020-04-24 DIAGNOSIS — M5442 Lumbago with sciatica, left side: Secondary | ICD-10-CM | POA: Diagnosis not present

## 2020-04-24 DIAGNOSIS — M79605 Pain in left leg: Secondary | ICD-10-CM | POA: Diagnosis present

## 2020-04-24 DIAGNOSIS — M47819 Spondylosis without myelopathy or radiculopathy, site unspecified: Secondary | ICD-10-CM | POA: Diagnosis present

## 2020-04-24 DIAGNOSIS — M4316 Spondylolisthesis, lumbar region: Secondary | ICD-10-CM

## 2020-04-24 DIAGNOSIS — M48061 Spinal stenosis, lumbar region without neurogenic claudication: Secondary | ICD-10-CM | POA: Diagnosis not present

## 2020-04-24 DIAGNOSIS — M4317 Spondylolisthesis, lumbosacral region: Secondary | ICD-10-CM | POA: Diagnosis present

## 2020-04-24 DIAGNOSIS — G894 Chronic pain syndrome: Secondary | ICD-10-CM

## 2020-04-24 DIAGNOSIS — Z789 Other specified health status: Secondary | ICD-10-CM | POA: Diagnosis not present

## 2020-04-24 DIAGNOSIS — M4126 Other idiopathic scoliosis, lumbar region: Secondary | ICD-10-CM

## 2020-04-24 DIAGNOSIS — M4310 Spondylolisthesis, site unspecified: Secondary | ICD-10-CM | POA: Diagnosis present

## 2020-04-24 DIAGNOSIS — M2540 Effusion, unspecified joint: Secondary | ICD-10-CM

## 2020-04-24 DIAGNOSIS — R29898 Other symptoms and signs involving the musculoskeletal system: Secondary | ICD-10-CM | POA: Diagnosis not present

## 2020-04-24 DIAGNOSIS — Z7901 Long term (current) use of anticoagulants: Secondary | ICD-10-CM

## 2020-04-24 DIAGNOSIS — I2782 Chronic pulmonary embolism: Secondary | ICD-10-CM | POA: Diagnosis not present

## 2020-04-24 DIAGNOSIS — M51369 Other intervertebral disc degeneration, lumbar region without mention of lumbar back pain or lower extremity pain: Secondary | ICD-10-CM

## 2020-04-24 NOTE — Progress Notes (Signed)
Safety precautions to be maintained throughout the outpatient stay will include: orient to surroundings, keep bed in low position, maintain call bell within reach at all times, provide assistance with transfer out of bed and ambulation.  

## 2020-04-24 NOTE — Patient Instructions (Addendum)
______________________________________________________________________________________________  Body mass index (BMI)  Body mass index (BMI) is a common tool for deciding whether a person has an appropriate body weight.  It measures a persons weight in relation to their height.   According to the [employer] of health (NIH): Marland Kitchen A BMI of less than 18.5 means that a person is underweight. . A BMI of between 18.5 and 24.9 is ideal. . A BMI of between 25 and 29.9 is overweight. . A BMI over 30 indicates obesity.  Weight Management Required  URGENT: Your weight has been found to be adversely affecting your health.  Dear Jamie Phillips:  Your current Estimated body mass index is 45.76 kg/m as calculated from the following:   Height as of this encounter: 5' 5" (1.651 m).   Weight as of this encounter: 275 lb (124.7 kg).  Please use the table below to identify your weight category and associated incidence of chronic pain, secondary to your weight.  Body Mass Index (BMI) Classification BMI level (kg/m2) Category Associated incidence of chronic pain  <18  Underweight   18.5-24.9 Ideal body weight   25-29.9 Overweight  20%  30-34.9 Obese (Class I)  68%  35-39.9 Severe obesity (Class II)  136%  >40 Extreme obesity (Class III)  254%   In addition: You will be considered "Morbidly Obese", if your BMI is above 30 and you have one or more of the following conditions which are known to be caused and/or directly associated with obesity: 1.    Type 2 Diabetes (Which in turn can lead to cardiovascular diseases (CVD), stroke, peripheral vascular diseases (PVD), retinopathy, nephropathy, and neuropathy) 2.    Cardiovascular Disease (High Blood Pressure; Congestive Heart Failure; High Cholesterol; Coronary Artery Disease; Angina; or History of Heart Attacks) 3.    Breathing problems (Asthma; obesity-hypoventilation syndrome; obstructive sleep apnea; chronic inflammatory airway disease; reactive  airway disease; or shortness of breath) 4.    Chronic kidney disease 5.    Liver disease (nonalcoholic fatty liver disease) 6.    High blood pressure 7.    Acid reflux (gastroesophageal reflux disease; heartburn) 8.    Osteoarthritis (OA) (with any of the following: hip pain; knee pain; and/or low back pain) 9.    Low back pain (Lumbar Facet Syndrome; and/or Degenerative Disc Disease) 10.  Hip pain (Osteoarthritis of hip) (For every 1 lbs of added body weight, there is a 2 lbs increase in pressure inside of each hip articulation. 1:2 mechanical relationship) 11.  Knee pain (Osteoarthritis of knee) (For every 1 lbs of added body weight, there is a 4 lbs increase in pressure inside of each knee articulation. 1:4 mechanical relationship) (patients with a BMI>30 kg/m2 were 6.8 times more likely to develop knee OA than normal-weight individuals) 12.  Cancer: Epidemiological studies have shown that obesity is a risk factor for: post-menopausal breast cancer; cancers of the endometrium, colon and kidney cancer; malignant adenomas of the oesophagus. Obese subjects have an approximately 1.5-3.5-fold increased risk of developing these cancers compared with normal-weight subjects, and it has been estimated that between 15 and 45% of these cancers can be attributed to overweight. More recent studies suggest that obesity may also increase the risk of other types of cancer, including pancreatic, hepatic and gallbladder cancer. (Ref: Obesity and cancer. Pischon T, Nthlings U, [employer] H. Proc Nutr Soc. 2008 May;67(2):128-45. doi: 32.9518/A4166063016010932.) The International Agency for Research on Cancer (IARC) has identified 13 cancers associated with overweight and obesity: meningioma, multiple myeloma, adenocarcinoma of the  esophagus, and cancers of the thyroid, postmenopausal breast cancer, gallbladder, stomach, liver, pancreas, kidney, ovaries, uterus, colon and rectal (colorectal) cancers. 77 percent of all cancers  diagnosed in women and 24 percent of those diagnosed in men are associated with overweight and obesity.  Recommendation: At this point it is urgent that you take a step back and concentrate in loosing weight. Dedicate 100% of your efforts on this task. Nothing else will improve your health more than bringing your weight down and your BMI to less than 30. If you are here, you probably have chronic pain. We know that most chronic pain patients have difficulty exercising secondary to their pain. For this reason, you must rely on proper nutrition and diet in order to lose the weight. If your BMI is above 40, you should seriously consider bariatric surgery. A realistic goal is to lose 10% of your body weight over a period of 12 months.  Be honest to yourself, if over time you have unsuccessfully tried to lose weight, then it is time for you to seek professional help and to enter a medically supervised weight management program, and/or undergo bariatric surgery. Stop procrastinating.   Pain management considerations:  1.    Pharmacological Problems: Be advised that the use of opioid analgesics (oxycodone; hydrocodone; morphine; methadone; codeine; and all of their derivatives) have been associated with decreased metabolism and weight gain.  For this reason, should we see that you are unable to lose weight while taking these medications, it may become necessary for [name] to taper down and indefinitely discontinue them.  2.    Technical Problems: The incidence of successful interventional therapies decreases as the patient's BMI increases. It is much more difficult to accomplish a safe and effective interventional therapy on a patient with a BMI above 35. 3.    Radiation Exposure Problems: The x-rays machine, used to accomplish injection therapies, will automatically increase their x-ray output in order to capture an appropriate bone image. This means that radiation exposure increases exponentially with the patient's  BMI. (The higher the BMI, the higher the radiation exposure.) Although the level of radiation used at a given time is still safe to the patient, it is not for the physician and/or assisting staff. Unfortunately, radiation exposure is accumulative. Because physicians and the staff have to do procedures and be exposed on a daily basis, this can result in health problems such as cancer and radiation burns. Radiation exposure to the staff is monitored by the radiation batches that they wear. The exposure levels are reported back to the staff on a quarterly basis. Depending on levels of exposure, physicians and staff may be obligated by law to decrease this exposure. This means that they have the right and obligation to refuse providing therapies where they may be overexposed to radiation. For this reason, physicians may decline to offer therapies such as radiofrequency ablation or implants to patients with a BMI above 40. 4.    Current Trends: Be advised that the current trend is to no longer offer certain therapies to patients with a BMI equal to, or above 35, due to increase perioperative risks, increased technical procedural difficulties, and excessive radiation exposure to healthcare personnel.  ______________________________________________________________________________________________    ____________________________________________________________________________________________  General Risks and Possible Complications  Patient Responsibilities: It is important that you read this as it is part of your informed consent. It is our duty to inform you of the risks and possible complications associated with treatments offered to you. It is your  responsibility as a patient to read this and to ask questions about anything that is not clear or that you believe was not covered in this document.  Patient's Rights: You have the right to refuse treatment. You also have the right to change your mind, even after  initially having agreed to have the treatment done. However, under this last option, if you wait until the last second to change your mind, you may be charged for the materials used up to that point.  Introduction: Medicine is not an Chief Strategy Officer. Everything in Medicine, including the lack of treatment(s), carries the potential for danger, harm, or loss (which is by definition: Risk). In Medicine, a complication is a secondary problem, condition, or disease that can aggravate an already existing one. All treatments carry the risk of possible complications. The fact that a side effects or complications occurs, does not imply that the treatment was conducted incorrectly. It must be clearly understood that these can happen even when everything is done following the highest safety standards.  No treatment: You can choose not to proceed with the proposed treatment alternative. The "PRO(s)" would include: avoiding the risk of complications associated with the therapy. The "CON(s)" would include: not getting any of the treatment benefits. These benefits fall under one of three categories: diagnostic; therapeutic; and/or palliative. Diagnostic benefits include: getting information which can ultimately lead to improvement of the disease or symptom(s). Therapeutic benefits are those associated with the successful treatment of the disease. Finally, palliative benefits are those related to the decrease of the primary symptoms, without necessarily curing the condition (example: decreasing the pain from a flare-up of a chronic condition, such as incurable terminal cancer).  General Risks and Complications: These are associated to most interventional treatments. They can occur alone, or in combination. They fall under one of the following six (6) categories: no benefit or worsening of symptoms; bleeding; infection; nerve damage; allergic reactions; and/or death. 1. No benefits or worsening of symptoms: In Medicine there  are no guarantees, only probabilities. No healthcare provider can ever guarantee that a medical treatment will work, they can only state the probability that it may. Furthermore, there is always the possibility that the condition may worsen, either directly, or indirectly, as a consequence of the treatment. 2. Bleeding: This is more common if the patient is taking a blood thinner, either prescription or over the counter (example: Goody Powders, Fish oil, Aspirin, Garlic, etc.), or if suffering a condition associated with impaired coagulation (example: Hemophilia, cirrhosis of the liver, low platelet counts, etc.). However, even if you do not have one on these, it can still happen. If you have any of these conditions, or take one of these drugs, make sure to notify your treating physician. 3. Infection: This is more common in patients with a compromised immune system, either due to disease (example: diabetes, cancer, human immunodeficiency virus [HIV], etc.), or due to medications or treatments (example: therapies used to treat cancer and rheumatological diseases). However, even if you do not have one on these, it can still happen. If you have any of these conditions, or take one of these drugs, make sure to notify your treating physician. 4. Nerve Damage: This is more common when the treatment is an invasive one, but it can also happen with the use of medications, such as those used in the treatment of cancer. The damage can occur to small secondary nerves, or to large primary ones, such as those in the spinal cord and brain.  This damage may be temporary or permanent and it may lead to impairments that can range from temporary numbness to permanent paralysis and/or brain death. 5. Allergic Reactions: Any time a substance or material comes in contact with our body, there is the possibility of an allergic reaction. These can range from a mild skin rash (contact dermatitis) to a severe systemic reaction  (anaphylactic reaction), which can result in death. 6. Death: In general, any medical intervention can result in death, most of the time due to an unforeseen complication. ____________________________________________________________________________________________  ____________________________________________________________________________________________  Blood Thinners  IMPORTANT NOTICE:  If you take any of these, make sure to notify the nursing staff.  Failure to do so may result in injury.  Recommended time intervals to stop and restart blood-thinners, before & after invasive procedures  Generic Name Brand Name Stop Time. Must be stopped at least this long before procedures. After procedures, wait at least this long before re-starting.  Abciximab Reopro 15 days 2 hrs  Alteplase Activase 10 days 10 days  Anagrelide Agrylin    Apixaban Eliquis 3 days 6 hrs  Cilostazol Pletal 3 days 5 hrs  Clopidogrel Plavix 7-10 days 2 hrs  Dabigatran Pradaxa 5 days 6 hrs  Dalteparin Fragmin 24 hours 4 hrs  Dipyridamole Aggrenox 11days 2 hrs  Edoxaban Lixiana; Savaysa 3 days 2 hrs  Enoxaparin  Lovenox 24 hours 4 hrs  Eptifibatide Integrillin 8 hours 2 hrs  Fondaparinux  Arixtra 72 hours 12 hrs  Prasugrel Effient 7-10 days 6 hrs  Reteplase Retavase 10 days 10 days  Rivaroxaban Xarelto 3 days 6 hrs  Ticagrelor Brilinta 5-7 days 6 hrs  Ticlopidine Ticlid 10-14 days 2 hrs  Tinzaparin Innohep 24 hours 4 hrs  Tirofiban Aggrastat 8 hours 2 hrs  Warfarin Coumadin 5 days 2 hrs   Other medications with blood-thinning effects  Product indications Generic (Brand) names Note  Cholesterol Lipitor Stop 4 days before procedure  Blood thinner (injectable) Heparin (LMW or LMWH Heparin) Stop 24 hours before procedure  Cancer Ibrutinib (Imbruvica) Stop 7 days before procedure  Malaria/Rheumatoid Hydroxychloroquine (Plaquenil) Stop 11 days before procedure  Thrombolytics  10 days before or after procedures    Over-the-counter (OTC) Products with blood-thinning effects  Product Common names Stop Time  Aspirin > 325 mg Goody Powders, Excedrin, etc. 11 days  Aspirin ? 81 mg  7 days  Fish oil  4 days  Garlic supplements  7 days  Ginkgo biloba  36 hours  Ginseng  24 hours  NSAIDs Ibuprofen, Naprosyn, etc. 3 days  Vitamin E  4 days   ____________________________________________________________________________________________

## 2020-04-25 DIAGNOSIS — G894 Chronic pain syndrome: Secondary | ICD-10-CM | POA: Diagnosis not present

## 2020-04-25 DIAGNOSIS — Z79899 Other long term (current) drug therapy: Secondary | ICD-10-CM | POA: Diagnosis not present

## 2020-04-27 DIAGNOSIS — G4733 Obstructive sleep apnea (adult) (pediatric): Secondary | ICD-10-CM | POA: Diagnosis not present

## 2020-04-27 DIAGNOSIS — R0600 Dyspnea, unspecified: Secondary | ICD-10-CM | POA: Diagnosis not present

## 2020-05-02 ENCOUNTER — Ambulatory Visit: Payer: Medicare HMO

## 2020-05-02 LAB — COMPLIANCE DRUG ANALYSIS, UR

## 2020-05-09 DIAGNOSIS — R200 Anesthesia of skin: Secondary | ICD-10-CM | POA: Diagnosis not present

## 2020-05-09 DIAGNOSIS — E7800 Pure hypercholesterolemia, unspecified: Secondary | ICD-10-CM | POA: Diagnosis not present

## 2020-05-09 DIAGNOSIS — R202 Paresthesia of skin: Secondary | ICD-10-CM | POA: Diagnosis not present

## 2020-05-09 DIAGNOSIS — I129 Hypertensive chronic kidney disease with stage 1 through stage 4 chronic kidney disease, or unspecified chronic kidney disease: Secondary | ICD-10-CM | POA: Diagnosis not present

## 2020-05-09 DIAGNOSIS — R739 Hyperglycemia, unspecified: Secondary | ICD-10-CM | POA: Diagnosis not present

## 2020-05-09 DIAGNOSIS — N1831 Chronic kidney disease, stage 3a: Secondary | ICD-10-CM | POA: Diagnosis not present

## 2020-05-09 NOTE — Progress Notes (Signed)
Patient on schedule for myelogram 1/19, however when this RN spoke with patient on phone, stated she did not receive word to hold Eliquis,cymbalta/lexapro. It was noted on paperwork from [facility] that multiple attempts were done to reach patient to give info regarding holding meds, even messages left, but no response,. Will plan to reschedule patient for another day where meds are being held. Told this to patient.

## 2020-05-10 ENCOUNTER — Ambulatory Visit: Payer: Medicare HMO

## 2020-05-10 ENCOUNTER — Inpatient Hospital Stay: Admission: RE | Admit: 2020-05-10 | Payer: Medicare HMO | Source: Ambulatory Visit

## 2020-05-10 ENCOUNTER — Ambulatory Visit
Admission: RE | Admit: 2020-05-10 | Discharge: 2020-05-10 | Disposition: A | Discharge status: Home / Self Care | Payer: Medicare HMO | Source: Ambulatory Visit | Attending: Pain Medicine | Admitting: Pain Medicine

## 2020-05-15 DIAGNOSIS — G4733 Obstructive sleep apnea (adult) (pediatric): Secondary | ICD-10-CM | POA: Diagnosis not present

## 2020-05-15 DIAGNOSIS — Z0000 Encounter for general adult medical examination without abnormal findings: Secondary | ICD-10-CM | POA: Diagnosis not present

## 2020-05-15 DIAGNOSIS — F32A Depression, unspecified: Secondary | ICD-10-CM | POA: Diagnosis not present

## 2020-05-15 DIAGNOSIS — R7303 Prediabetes: Secondary | ICD-10-CM | POA: Diagnosis not present

## 2020-05-15 DIAGNOSIS — I2602 Saddle embolus of pulmonary artery with acute cor pulmonale: Secondary | ICD-10-CM | POA: Diagnosis not present

## 2020-05-15 DIAGNOSIS — I2782 Chronic pulmonary embolism: Secondary | ICD-10-CM | POA: Diagnosis not present

## 2020-05-15 DIAGNOSIS — N1831 Chronic kidney disease, stage 3a: Secondary | ICD-10-CM | POA: Diagnosis not present

## 2020-05-15 DIAGNOSIS — M48062 Spinal stenosis, lumbar region with neurogenic claudication: Secondary | ICD-10-CM | POA: Diagnosis not present

## 2020-05-15 DIAGNOSIS — I129 Hypertensive chronic kidney disease with stage 1 through stage 4 chronic kidney disease, or unspecified chronic kidney disease: Secondary | ICD-10-CM | POA: Diagnosis not present

## 2020-05-18 ENCOUNTER — Ambulatory Visit
Admission: RE | Admit: 2020-05-18 | Discharge: 2020-05-18 | Disposition: A | Discharge status: Home / Self Care | Payer: Medicare HMO | Source: Ambulatory Visit | Attending: Pain Medicine | Admitting: Pain Medicine

## 2020-05-18 ENCOUNTER — Ambulatory Visit: Admission: RE | Admit: 2020-05-18 | Payer: Medicare HMO | Source: Ambulatory Visit

## 2020-05-18 ENCOUNTER — Telehealth: Payer: Self-pay

## 2020-05-18 ENCOUNTER — Inpatient Hospital Stay
Admission: RE | Admit: 2020-05-18 | Discharge: 2020-05-18 | Disposition: A | Discharge status: Home / Self Care | Payer: Medicare HMO | Source: Ambulatory Visit | Attending: Pain Medicine | Admitting: Pain Medicine

## 2020-05-18 NOTE — Telephone Encounter (Signed)
Dr. Dossie Arbour, This patient was scheduled for her CT myelogram twice and twice she has said no one called her to let her know, or to stop her meds before the CT. This is not true because I called her to let her know the first date and [facility] called her several times to let her know about instructions and stopping her medicines prior to CT. Dr. Register wanted to reach out to you and let you know what is going on. I dont know that they will reschedule her again.

## 2020-05-18 NOTE — Progress Notes (Signed)
Called and spoke with this patient who did not hold meds as [facility] had called with instructions given for her procedure on  05/10/2020, placed her on schedule for myleogram today 1/27, with patient called prior to today , I spoke with this lady to hold Eliquis/lexapro and cymbalta with LD being 1/24, however she did not come today for procedure. When patient contacted this am for no show, she stated she did not get phone call for time/date and to hold meds. Spoke with DR Register, who had me contact Dr Dossie Arbour in pain clinic to let him know of patient no show X2.

## 2020-05-29 DIAGNOSIS — M8588 Other specified disorders of bone density and structure, other site: Secondary | ICD-10-CM | POA: Diagnosis not present

## 2020-05-31 ENCOUNTER — Telehealth: Payer: Self-pay

## 2020-05-31 NOTE — Telephone Encounter (Signed)
Please advise 

## 2020-05-31 NOTE — Telephone Encounter (Signed)
The patient called in today wanting to know when she can be seen. Dr. Dossie Arbour saw her as a new patient 04/24/20 and he ordered a CT myelogram. She has had 2 scheduled and didn't show for either one. She states no one called her and told her about the appts. I called her the first time myself, and CT has documented that they called her twice with instructions. I doubt that they will reschedule her again. What do you want me to do? She wants an appointment here for her pain.

## 2020-06-05 NOTE — Telephone Encounter (Signed)
This patient is calling us several times a day. Please let me know what you want to do.

## 2020-06-06 ENCOUNTER — Other Ambulatory Visit: Payer: Self-pay | Admitting: Pain Medicine

## 2020-06-06 DIAGNOSIS — M51369 Other intervertebral disc degeneration, lumbar region without mention of lumbar back pain or lower extremity pain: Secondary | ICD-10-CM

## 2020-06-06 DIAGNOSIS — G8929 Other chronic pain: Secondary | ICD-10-CM

## 2020-06-06 DIAGNOSIS — M5136 Other intervertebral disc degeneration, lumbar region: Secondary | ICD-10-CM

## 2020-06-06 DIAGNOSIS — M48061 Spinal stenosis, lumbar region without neurogenic claudication: Secondary | ICD-10-CM

## 2020-06-06 DIAGNOSIS — M4317 Spondylolisthesis, lumbosacral region: Secondary | ICD-10-CM

## 2020-06-06 DIAGNOSIS — M5417 Radiculopathy, lumbosacral region: Secondary | ICD-10-CM

## 2020-06-06 DIAGNOSIS — M5442 Lumbago with sciatica, left side: Secondary | ICD-10-CM

## 2020-06-06 DIAGNOSIS — M79605 Pain in left leg: Secondary | ICD-10-CM

## 2020-06-06 DIAGNOSIS — R937 Abnormal findings on diagnostic imaging of other parts of musculoskeletal system: Secondary | ICD-10-CM

## 2020-06-06 DIAGNOSIS — M4316 Spondylolisthesis, lumbar region: Secondary | ICD-10-CM

## 2020-06-06 DIAGNOSIS — M48062 Spinal stenosis, lumbar region with neurogenic claudication: Secondary | ICD-10-CM

## 2020-06-06 DIAGNOSIS — R29898 Other symptoms and signs involving the musculoskeletal system: Secondary | ICD-10-CM

## 2020-06-06 NOTE — Telephone Encounter (Signed)
Called patient to see what the issue is about her appts.  She states she did not know that she had appts for CT scan.  I told her that I would check on that and see if we could get her rescheduled for CT and then get her back in to see Dr Jamie Phillips to review lab results and scan results.  Patient is agreeable to this.

## 2020-06-06 NOTE — Telephone Encounter (Signed)
The patient called back again today. Please let us know what to tell her. This has been in for a week. Thanks

## 2020-06-15 ENCOUNTER — Other Ambulatory Visit: Payer: Self-pay | Admitting: Radiology

## 2020-06-16 ENCOUNTER — Ambulatory Visit: Payer: Medicare HMO

## 2020-06-16 ENCOUNTER — Ambulatory Visit
Admission: RE | Admit: 2020-06-16 | Discharge: 2020-06-16 | Disposition: A | Discharge status: Home / Self Care | Payer: Medicare HMO | Source: Ambulatory Visit | Attending: Pain Medicine | Admitting: Pain Medicine

## 2020-06-16 ENCOUNTER — Other Ambulatory Visit: Payer: Self-pay

## 2020-06-16 DIAGNOSIS — M47819 Spondylosis without myelopathy or radiculopathy, site unspecified: Secondary | ICD-10-CM | POA: Diagnosis not present

## 2020-06-16 DIAGNOSIS — M79604 Pain in right leg: Secondary | ICD-10-CM | POA: Diagnosis not present

## 2020-06-16 DIAGNOSIS — M5417 Radiculopathy, lumbosacral region: Secondary | ICD-10-CM

## 2020-06-16 DIAGNOSIS — M51369 Other intervertebral disc degeneration, lumbar region without mention of lumbar back pain or lower extremity pain: Secondary | ICD-10-CM

## 2020-06-16 DIAGNOSIS — M4316 Spondylolisthesis, lumbar region: Secondary | ICD-10-CM

## 2020-06-16 DIAGNOSIS — M5136 Other intervertebral disc degeneration, lumbar region: Secondary | ICD-10-CM

## 2020-06-16 DIAGNOSIS — G8929 Other chronic pain: Secondary | ICD-10-CM | POA: Diagnosis not present

## 2020-06-16 DIAGNOSIS — M5441 Lumbago with sciatica, right side: Secondary | ICD-10-CM | POA: Diagnosis not present

## 2020-06-16 DIAGNOSIS — M4317 Spondylolisthesis, lumbosacral region: Secondary | ICD-10-CM

## 2020-06-16 DIAGNOSIS — R937 Abnormal findings on diagnostic imaging of other parts of musculoskeletal system: Secondary | ICD-10-CM

## 2020-06-16 DIAGNOSIS — M48061 Spinal stenosis, lumbar region without neurogenic claudication: Secondary | ICD-10-CM

## 2020-06-16 DIAGNOSIS — I700 Atherosclerosis of aorta: Secondary | ICD-10-CM | POA: Insufficient documentation

## 2020-06-16 DIAGNOSIS — M79605 Pain in left leg: Secondary | ICD-10-CM | POA: Insufficient documentation

## 2020-06-16 DIAGNOSIS — R29898 Other symptoms and signs involving the musculoskeletal system: Secondary | ICD-10-CM | POA: Diagnosis not present

## 2020-06-16 DIAGNOSIS — M5442 Lumbago with sciatica, left side: Secondary | ICD-10-CM | POA: Diagnosis not present

## 2020-06-16 DIAGNOSIS — M48062 Spinal stenosis, lumbar region with neurogenic claudication: Secondary | ICD-10-CM

## 2020-06-16 LAB — CBC WITH DIFFERENTIAL/PLATELET
Abs Immature Granulocytes: 0.03 10*3/uL (ref 0.00–0.07)
Basophils Absolute: 0.1 10*3/uL (ref 0.0–0.1)
Basophils Relative: 1 %
Eosinophils Absolute: 0.2 10*3/uL (ref 0.0–0.5)
Eosinophils Relative: 2 %
HCT: 42.2 % (ref 36.0–46.0)
Hemoglobin: 13.6 g/dL (ref 12.0–15.0)
Immature Granulocytes: 0 %
Lymphocytes Relative: 58 %
Lymphs Abs: 8.3 10*3/uL — ABNORMAL HIGH (ref 0.7–4.0)
MCH: 31.3 pg (ref 26.0–34.0)
MCHC: 32.2 g/dL (ref 30.0–36.0)
MCV: 97.2 fL (ref 80.0–100.0)
Monocytes Absolute: 0.8 10*3/uL (ref 0.1–1.0)
Monocytes Relative: 6 %
Neutro Abs: 4.6 10*3/uL (ref 1.7–7.7)
Neutrophils Relative %: 33 %
Platelets: 244 10*3/uL (ref 150–400)
RBC: 4.34 MIL/uL (ref 3.87–5.11)
RDW: 14.6 % (ref 11.5–15.5)
Smear Review: NORMAL
WBC: 14.1 10*3/uL — ABNORMAL HIGH (ref 4.0–10.5)
nRBC: 0 % (ref 0.0–0.2)

## 2020-06-16 LAB — BASIC METABOLIC PANEL
Anion gap: 10 (ref 5–15)
BUN: 14 mg/dL (ref 8–23)
CO2: 27 mmol/L (ref 22–32)
Calcium: 9 mg/dL (ref 8.9–10.3)
Chloride: 105 mmol/L (ref 98–111)
Creatinine, Ser: 0.92 mg/dL (ref 0.44–1.00)
GFR, Estimated: >60 mL/min (ref >60)
Glucose, Bld: 139 mg/dL — ABNORMAL HIGH (ref 70–99)
Potassium: 4 mmol/L (ref 3.5–5.1)
Sodium: 142 mmol/L (ref 135–145)

## 2020-06-16 LAB — PROTIME-INR
INR: 1 (ref 0.8–1.2)
Prothrombin Time: 12.6 seconds (ref 11.4–15.2)

## 2020-06-16 LAB — APTT: aPTT: 30 seconds (ref 24–36)

## 2020-06-16 MED ORDER — IOHEXOL 180 MG/ML  SOLN
20.0000 mL | Freq: Once | INTRAMUSCULAR | Status: AC | PRN
  Administered 2020-06-16: 20 mL via INTRATHECAL

## 2020-06-16 MED ORDER — LIDOCAINE HCL (PF) 1 % IJ SOLN
5.0000 mL | Freq: Once | INTRAMUSCULAR | Status: AC
  Administered 2020-06-16: 5 mL
  Filled 2020-06-16: qty 5

## 2020-06-16 NOTE — Discharge Instructions (Signed)
Myelogram  A myelogram is an imaging test. This test checks for problems in the spinal cord and the places where nerves attach to the spinal cord (nerve roots). A dye (contrast material) is put into your spine before the X-ray. This provides a clearer image for your doctor to see. You may need this test if you have a spinal cord problem that cannot be diagnosed with other imaging tests. You may also have this test to check your spine after surgery. Tell a doctor about:  Any allergies you have, especially to iodine.  All medicines you are taking, including vitamins, herbs, eye drops, creams, and over-the-counter medicines.  Any problems you or family members have had with anesthetic medicines or dye.  Any blood disorders you have.  Any surgeries you have had.  Any medical conditions you have or have had, including asthma.  Whether you are pregnant or may be pregnant. What are the risks? Generally, this is a safe procedure. However, problems may occur, including:  Infection.  Bleeding.  Allergic reaction to medicines or dyes.  Damage to your spinal cord or nerves.  Leaking of spinal fluid. This can cause a headache.  Damage to kidneys.  Seizures. This is rare. What happens before the procedure?  Follow instructions from your doctor about what you cannot eat or drink. You may be asked to drink more fluids.  Ask your doctor about changing or stopping your normal medicines. This is important if you take diabetes medicines or blood thinners.  Plan to have someone take you home from the hospital or clinic.  If you will be going home right after the procedure, plan to have someone with you for 24 hours. What happens during the procedure?  You will lie face down on a table.  Your doctor will find the best injection site on your spine. This is most often in the lower back.  This area will be washed with soap.  You will be given a medicine to numb the area (local  anesthetic).  Your doctor will place a long needle into the space around your spinal cord.  A sample of spinal fluid may be taken. This may be sent to the lab for testing.  The dye will be injected into the space around your spinal cord.  The exam table may be tilted. This helps the dye flow up or down your spine.  The X-ray will take images of your spinal cord.  A bandage (dressing) may be placed over the area where the dye was injected. The procedure may vary among doctors and hospitals. What can I expect after the procedure?  You may be monitored until you leave the hospital or clinic. This includes checking your blood pressure, heart rate, breathing rate, and blood oxygen level.  You may feel sore at the injection site. You may have a mild headache.  You will be told to lie flat with your head raised (elevated). This lowers the risk of a headache.  It is up to you to get the results of your procedure. Ask your doctor, or the department that is doing the procedure, when your results will be ready. Follow these instructions at home:  Rest as told by your doctor. Lie flat with your head slightly elevated.  Do not bend, lift, or do hard work for 24-48 hours, or as told by your doctor.  Take over-the-counter and prescription medicines only as told by your doctor.  Take care of your bandage as told by your doctor.  Drink enough fluid to keep your pee (urine) pale yellow.  Bathe or shower as told by your doctor.   Contact a doctor if:  You have a fever.  You have a headache that lasts longer than 24 hours.  You feel sick to your stomach (nauseous).  You vomit.  Your neck is stiff.  Your legs feel numb.  You cannot pee.  You cannot poop (no bowel movement).  You have a rash.  You are itchy or sneezing. Get help right away if:  You have new symptoms or your symptoms get worse.  You have a seizure.  You have trouble breathing. Summary  A myelogram is an  imaging test that checks for problems in the spinal cord and the places where nerves attach to the spinal cord (nerve roots).  Before the procedure, follow instructions from your doctor. You will be told what not to eat or drink, or what medicines to change or stop.  After the procedure, you will be told to lie flat with your head raised (elevated). This will lower your risk of a headache.  Do not bend, lift, or do any hard work for 24-48 hours, or as told by your doctor.  Contact a doctor if you have a stiff neck or numb legs. Get help right away if your symptoms get worse, or you have a seizure or trouble breathing. This information is not intended to replace advice given to you by your health care provider. Make sure you discuss any questions you have with your health care provider. Document Revised: 06/17/2018 Document Reviewed: 06/18/2018 Elsevier Patient Education  [place].

## 2020-06-20 NOTE — Progress Notes (Signed)
PROVIDER NOTE: Information contained herein reflects review and annotations entered in association with encounter. Interpretation of such information and data should be left to medically-trained personnel. Information provided to patient can be located elsewhere in the medical record under "Patient Instructions". Document created using STT-dictation technology, any transcriptional errors that may result from process are unintentional.    Patient: Jamie Phillips  Service Category: E/M  Provider: Gaspar Cola, MD  DOB: 1940/07/22  DOS: 06/21/2020  Specialty: Interventional Pain Management  MRN: [id number]  Setting: Ambulatory outpatient  PCP: Jamie Ruths, MD  Type: Established Patient    Referring Provider: Kirk Ruths, MD  Location: Office  Delivery: Face-to-face     Primary Reason(s) for Visit: Encounter for evaluation before starting new chronic pain management plan of care (Level of risk: moderate) CC: Back Pain (low), Leg Pain (Bilateral and anterior to mid thigh), and Pain (Bilateral buttocks)  HPI  Jamie Phillips is a 80 y.o. year old, female patient, who comes today for a follow-up evaluation to review the test results and decide on a treatment plan. She has Essential hypertension; Hyperlipidemia; Pain in the chest; SOB (shortness of breath); Acute bronchitis; Acute respiratory failure (HCC); CLL (chronic lymphocytic leukemia) (Jamie Phillips); Chronic pulmonary embolism ([place]); History of DVT in adulthood; DJD (degenerative joint disease); Chronic lower extremity pain (1ry area of Pain) (Bilateral); Venous insufficiency; Calculus of gallbladder without cholecystitis without obstruction; Right lower quadrant pain; COVID-19; Monoclonal gammopathy; Other fatigue; Chronic anticoagulation (Eliquis); Adnexal cyst; DDD (degenerative disc disease), lumbosacral; Depression, major, in remission (Jamie Phillips); Generalized OA; GERD without esophagitis; Healthcare maintenance; History of Bell's palsy;  Hyperglycemia, unspecified; 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Jamie Phillips); Morbid obesity with BMI of 40.0-44.9, adult (Jamie Phillips); OSA on CPAP; Pes anserine bursitis; Pure hypercholesterolemia; RLS (restless legs syndrome); Walker as ambulation aid; Chronic pain syndrome; Pharmacologic therapy; Disorder of skeletal system; Problems influencing health status; Chronic low back pain (2ry area of Pain) (Bilateral) w/ sciatica (Bilateral); Lumbosacral radiculopathy at L1 (Bilateral); Lumbosacral radiculopathy at L5 (Left); Lumbosacral radiculopathy at S1 (Right); 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); Abnormal MRI, lumbar spine (01/24/2020); Lumbar facet syndrome (Multilevel) (Bilateral); Lumbar facet hypertrophy (Multilevel) (Bilateral); Severe muscle deconditioning; Lower extremity weakness (Bilateral); Lumbosacral facet hypertrophy (Multilevel) (Bilateral); Lumbar scoliosis (idiopathic); Spondylolisthesis of lumbosacral region (Multilevel); Retrolisthesis (L1/L2); Anterolisthesis of lumbar spine (L3/L4, L4/L5); Anterolisthesis of lumbosacral spine (L5/S1); Lumbar lateral recess stenosis (Bilateral: L3-4, L4-5) (Severe); Lumbar foraminal stenosis (Right: L4-5); Synovial cyst of lumbar facet joint (L3-4) (Right); Arthropathy of spinal facet joint concurrent with and due to effusion (L3-4); Annular tear of lumbar disc (L3-4) (Left); Prediabetes; Chronic lower extremity radicular pain (L1/L2 dermatomes) (Bilateral); Chronic groin pain (Bilateral); Neurogenic pain, lower extremity (Bilateral); Shooting pain, intermittent (electrical-like); and History of total knee replacement (Bilateral)  on their problem list. Her primarily concern today is the Back Pain (low), Leg Pain (Bilateral and anterior to mid thigh), and Pain (Bilateral buttocks)  Pain Assessment: Location: Lower Back Radiating: buttocks, thighs- electircal shocks in her feet and toes Onset: More than a month ago Duration:  Chronic pain Quality: Burning,Throbbing,Aching,Tiring Severity: 8 /10 (subjective, self-reported pain score)  Effect on ADL:   Timing: Intermittent Modifying factors: rest BP:    HR: 69  Jamie Phillips comes in today for a follow-up visit after her initial evaluation on 05/31/2020. Today we went over the results of her tests. These were explained in "Layman's terms". During today's appointment we went over my diagnostic impression, as well as the proposed  treatment plan.  Morbidly obese 80 year old, white female patient with medical history significant for stage III chronic kidney disease, depression, GERD, obstructive sleep apnea on CPAP, currently anticoagulated on Eliquis secondary to history of DVT and pulmonary embolism, with referred to our practice after having been referred to Jamie Phillips for multiple unsuccessful interventional therapies.  The patient has failed several medication trials including tramadol, Zanaflex, baclofen, Celebrex, Lidoderm, as well as physical therapy, injection therapy, and radiofrequency ablation.  The patient also has a history of having experienced hallucinations with the use of oxycodone and morphine and therefore does not want to be placed on any medications that could be addictive.  The patient indicated that she was told by Jamie Phillips that she could never come off of her blood thinner (Eliquis), however she has stopped that for previous of 3 days prior to interventional therapies done by Jamie Phillips.  The patient refers having been told that her condition is nonoperable.  She also indicates not having any desire to undergo surgery.  The patient describes her pain as a 10/10 and she indicates that initially the worst pain was the lower back but after the radiofrequency ablation now it is the legs.  She refers that she had her hopes high for the radiofrequency, but according to her it did not work.  Currently the patient's primary area of pain is  that of the lower extremities, bilaterally.  The patient indicates that the pain is equally as bad on both legs.  This pain is located primarily in the area of the groin and upper leg and what appears to be the distribution of L1.  She denies any surgeries in the area where she is experiencing the pain but she does admit to having undergone bilateral total knee replacement by Dr. Leanor Kail with the left side having been done first and 6 months later the right side.  The total knee replacement was done around 2017.  She denies any hip pain but she does admit to some intermittent electrical-like sensations on the left leg between the big toe and the second toe and what appears to be an L5 or deep peroneal distribution.  In the case of the right lower extremity she also experiences intermittent electrical-like sensations that primarily affect the distal lateral aspect of her foot and the last 3 toes in what appears to be the distribution of the sural lateral dorsal cutaneous nerve (S1/S2).  The patient's secondary area of pain is that of the lower back, bilaterally.  She refers that the pain is equally as bad on both sides.  She denies any prior surgery, but does admit to having had physical therapy and a recent MRI of the lumbar spine.  She has had multiple nerve blocks done by Jamie Phillips, including a radiofrequency ablation.  None of them seem to have helped, according to the patient.  Below is a list of the procedures done by Jamie Phillips.  Procedures by Chas[facility]: 02/23/2020: RF neurotomy to the bilateral L3-4 and L4-5 facet joints (no relief) (bilateral L3, L4 medial branch and L5 dorsal rami) 12/16/2019: Bilateral L4-5 transforaminal ESI (ODI: 64%, one day of relief then return of pain) (Oswestry Disability Index) (L4) 11/19/2019: Bilateral L5-S1 transforaminal ESI (ODI 64%, no relief) (L5) 10/06/2019: MBB to the bilateral L3-4 and L4-5 facet joints (8/10 to 1/10, continued relief of back  pain new pain radiating to the bilateral posterior thighs and occasional anterior thighs) (Bilateral L2, L3 and L4 medial branch  block) 08/05/2019: MBB to the bilateral L3-4 and L4-5 facet joints (10/10 to 3/10) (Bilateral L2, L3 and L4 medial branch block) 06/01/2019: Bilateral L3-4 and L4-5 facet joint injections (good relief) (L3-4 and L4-5 intra-articular injection) 06/09/2018: Bilateral L5-S1 transforaminal ESI (mild relief for 3 days) 05/11/2018: Bilateral L5-S1 transforaminal ESI (30% relief x7 to 10 days) (L5)    We are pending EMG/PNCV  that I ordered 04/24/2020 and schedule for 06/22/2020.   Today I took the time to go over the results of her CT myelogram in great detail explaining everything to her in layman's terms.  At this point, we will go ahead and take over her gabapentin and we will slowly increase it as tolerated.  In addition we will start her on a low dose opioid (oxycodone).  Her chart indicated that she is allergic to morphine and oxycodone but when I personally asked her about it she indicated that this comes from an event where she was in the hospital recovering from surgery and apparently she had been given morphine as well as oxycodone and she began to hallucinate.  She actually became rather defensive indicating that they should not put those as allergies.  She refers that she is not allergic to the medication she was simply given a bad combination and a dose higher than it should have been and it resulted in her having cognitive impairment and hallucinations which disappeared once the medicine was stopped and he got out of her system.  In considering the treatment plan options, Ms. Moeckel was reminded that I no longer take patients for medication management only. I asked her to let me know if she had no intention of taking advantage of the interventional therapies, so that we could make arrangements to provide this space to someone interested. I also made it clear that  undergoing interventional therapies for the purpose of getting pain medications is very inappropriate on the part of a patient, and it will not be tolerated in this practice. This type of behavior would suggest true addiction and therefore it requires referral to an addiction specialist.   Further details on both, my assessment(s), as well as the proposed treatment plan, please see below.  Controlled Substance Pharmacotherapy Assessment REMS (Risk Evaluation and Mitigation Strategy)  Analgesic: Tramadol 50 mg, 1 tab p.o. 4 times daily (200 mg/day of tramadol) MME/day: 20 mg/day  Pill Count: None expected due to no prior prescriptions written by our practice. Hart Rochester, RN  06/21/2020  2:33 PM  Sign when Signing Visit Safety precautions to be maintained throughout the outpatient stay will include: orient to surroundings, keep bed in low position, maintain call bell within reach at all times, provide assistance with transfer out of bed and ambulation.    Pharmacokinetics: Liberation and absorption (onset of action): WNL Distribution (time to peak effect): WNL Metabolism and excretion (duration of action): WNL         Pharmacodynamics: Desired effects: Analgesia: Ms. Kobs reports >50% benefit. Functional ability: Patient reports that medication allows her to accomplish basic ADLs Clinically meaningful improvement in function (CMIF): Sustained CMIF goals met Perceived effectiveness: Described as relatively effective, allowing for increase in activities of daily living (ADL) Undesirable effects: Side-effects or Adverse reactions: None reported Monitoring: [place] PMP: PDMP reviewed during this encounter. Online review of the past 20-monthperiod previously conducted. Not applicable at this point since we have not taken over the patient's medication management yet. List of other Serum/Urine Drug Screening Test(s):  No results found  for: AMPHSCRSER, BARBSCRSER, BENZOSCRSER, COCAINSCRSER,  COCAINSCRNUR, PCPSCRSER, THCSCRSER, THCU, CANNABQUANT, OPIATESCRSER, OXYSCRSER, [place], [facility] List of all UDS test(s) done:  Lab Results  Component Value Date   SUMMARY Note 04/25/2020   Last UDS on record: Summary  Date Value Ref Range Status  04/25/2020 Note  Final    Comment:    ==================================================================== Compliance Drug Analysis, Ur ==================================================================== Test                             Result       Flag       Units  Drug Present and Declared for Prescription Verification   Gabapentin                     PRESENT      EXPECTED   Citalopram                     PRESENT      EXPECTED   Desmethylcitalopram            PRESENT      EXPECTED    Desmethylcitalopram is an expected metabolite of citalopram or the    enantiomeric form, escitalopram.    Duloxetine                     PRESENT      EXPECTED   Acetaminophen                  PRESENT      EXPECTED  Drug Present not Declared for Prescription Verification   Tramadol                       2732         UNEXPECTED ng/mg creat   O-Desmethyltramadol            643          UNEXPECTED ng/mg creat   N-Desmethyltramadol            771          UNEXPECTED ng/mg creat    Source of tramadol is a prescription medication. O-desmethyltramadol    and N-desmethyltramadol are expected metabolites of tramadol.    Cyclobenzaprine                PRESENT      UNEXPECTED   Desmethylcyclobenzaprine       PRESENT      UNEXPECTED    Desmethylcyclobenzaprine is an expected metabolite of    cyclobenzaprine.    Propranolol                    PRESENT      UNEXPECTED ==================================================================== Test                      Result    Flag   Units      Ref Range   Creatinine              180              mg/dL      >=20 ==================================================================== Declared Medications:  The flagging  and interpretation on this report are based on the  following declared medications.  Unexpected results may arise from  inaccuracies in the declared medications.   **Note: The testing scope of this panel includes these medications:   Duloxetine (  Cymbalta)  Escitalopram (Lexapro)  Gabapentin (Neurontin)   **Note: The testing scope of this panel does not include small to  moderate amounts of these reported medications:   Acetaminophen (Tylenol)   **Note: The testing scope of this panel does not include the  following reported medications:   Albuterol (Ventolin HFA)  Apixaban (Eliquis)  Cholestyramine (Questran)  Hydrochlorothiazide (Hyzaar)  Losartan (Hyzaar)  Pantoprazole (Protonix)  Pravastatin (Pravachol)  Sucralfate (Carafate) ==================================================================== For clinical consultation, please call [phone]. ====================================================================    UDS interpretation: No unexpected findings.          Medication Assessment Form: Patient introduced to form today Treatment compliance: Treatment may start today if patient agrees with proposed plan. Evaluation of compliance is not applicable at this point Risk Assessment Profile: Aberrant behavior: See initial evaluations. None observed or detected today Comorbid factors increasing risk of overdose: See initial evaluation. No additional risks detected today Opioid risk tool (ORT):  Opioid Risk  04/24/2020  Alcohol 0  Illegal Drugs 2  Rx Drugs 0  Alcohol 0  Illegal Drugs 0  Rx Drugs 0  Age between 16-45 years  0  History of Preadolescent Sexual Abuse 0  Psychological Disease 0  Depression 0  Opioid Risk Tool Scoring 2  Opioid Risk Interpretation Low Risk    ORT Scoring interpretation table:  Score <3 = Low Risk for SUD  Score between 4-7 = Moderate Risk for SUD  Score >8 = High Risk for Opioid Abuse   Risk of substance use disorder (SUD):  Low  Risk Mitigation Strategies:  Patient opioid safety counseling: Completed today. Counseling provided to patient as per "Patient Counseling Document". Document signed by patient, attesting to counseling and understanding Patient-Prescriber Agreement (PPA): Obtained today.  Controlled substance notification to other providers: Written and sent today.  Pharmacologic Plan: Today we may be taking over the patient's pharmacological regimen. See below.             Laboratory Chemistry Profile   Renal Lab Results  Component Value Date   BUN 14 06/16/2020   CREATININE 0.92 06/16/2020   GFRAA 55 (L) 09/03/2019   GFRNONAA >60 06/16/2020   PROTEINUR 30 (A) 12/13/2018     Electrolytes Lab Results  Component Value Date   NA 142 06/16/2020   K 4.0 06/16/2020   CL 105 06/16/2020   CALCIUM 9.0 06/16/2020   MG 1.9 12/18/2018     Hepatic Lab Results  Component Value Date   AST 17 03/06/2020   ALT 15 03/06/2020   ALBUMIN 3.5 03/06/2020   ALKPHOS 93 03/06/2020   LIPASE 37 10/09/2018     ID Lab Results  Component Value Date   SARSCOV2NAA Detected (A) 11/30/2018   STAPHAUREUS NEGATIVE 10/10/2018   MRSAPCR NEGATIVE 10/10/2018     Bone No results found for: VD25OH, VD125OH2TOT, BW3893TD4, KA7681LX7, 25OHVITD1, 25OHVITD2, 25OHVITD3, TESTOFREE, TESTOSTERONE   Endocrine Lab Results  Component Value Date   GLUCOSE 139 (H) 06/16/2020   GLUCOSEU NEGATIVE 12/13/2018   TSH 1.304 09/03/2019     Neuropathy No results found for: VITAMINB12, FOLATE, HGBA1C, HIV   CNS No results found for: COLORCSF, APPEARCSF, RBCCOUNTCSF, WBCCSF, POLYSCSF, LYMPHSCSF, EOSCSF, PROTEINCSF, GLUCCSF, JCVIRUS, CSFOLI, IGGCSF, LABACHR, ACETBL, LABACHR, ACETBL   Inflammation (CRP: Acute  ESR: Chronic) Lab Results  Component Value Date   CRP 1.4 (H) 12/20/2018   LATICACIDVEN 0.9 12/13/2018     Rheumatology No results found for: RF, ANA, LABURIC, URICUR, LYMEIGGIGMAB, LYMEABIGMQN, HLAB27    Coagulation Lab Results  Component Value Date   INR 1.0 06/16/2020   LABPROT 12.6 06/16/2020   APTT 30 06/16/2020   PLT 244 06/16/2020   DDIMER 1.45 (H) 12/18/2018     Cardiovascular Lab Results  Component Value Date   BNP 298.5 (H) 12/18/2018   TROPONINI <0.03 11/03/2016   HGB 13.6 06/16/2020   HCT 42.2 06/16/2020     Screening Lab Results  Component Value Date   SARSCOV2NAA Detected (A) 11/30/2018   STAPHAUREUS NEGATIVE 10/10/2018   MRSAPCR NEGATIVE 10/10/2018     Cancer No results found for: CEA, CA125, LABCA2   Allergens No results found for: ALMOND, APPLE, ASPARAGUS, AVOCADO, BANANA, BARLEY, BASIL, BAYLEAF, GREENBEAN, LIMABEAN, WHITEBEAN, BEEFIGE, REDBEET, BLUEBERRY, BROCCOLI, CABBAGE, MELON, CARROT, CASEIN, CASHEWNUT, CAULIFLOWER, CELERY     Note: Lab results reviewed.  Recent Diagnostic Imaging Review  Lumbosacral Imaging: Lumbar MR wo contrast: Results for orders placed during the hospital encounter of 01/24/20 MR LUMBAR SPINE WO CONTRAST  Narrative CLINICAL DATA:  Initial evaluation for lower back pain with extension into the lower extremities with associated tingling.  EXAM: MRI LUMBAR SPINE WITHOUT CONTRAST  TECHNIQUE: Multiplanar, multisequence MR imaging of the lumbar spine was performed. No intravenous contrast was administered.  COMPARISON:  Prior MRI from 04/10/2018.  FINDINGS: Segmentation: Standard. Lowest well-formed disc space labeled the L5-S1 level.  Alignment: Scoliosis. Trace retrolisthesis of L1 on L2. 5 mm anterolisthesis of L3 on L4 with 6 mm anterolisthesis of L4 on L5, and trace anterolisthesis of L5 on S1. Findings chronic and facet mediated.  Vertebrae: Vertebral body height maintained without acute or chronic fracture. Bone marrow signal intensity diffusely heterogeneous but within normal limits. Few scattered benign hemangiomata noted. No worrisome osseous lesions. No abnormal marrow edema.  Conus medullaris and cauda  equina: Conus extends to the T12-L1 level. Conus and cauda equina appear normal.  Paraspinal and other soft tissues: Paraspinous soft tissues demonstrate no acute finding. Few scattered subcentimeter simple cyst noted within the right kidney. Fibroid uterus noted. 2.9 cm left adnexal cyst partially visualized (series 7, image 16), indeterminate.  Disc levels:  T12-L1: Disc desiccation with mild disc bulge. Mild to moderate left greater than right facet hypertrophy. No stenosis.  L1-2: Diffuse disc bulge with disc desiccation. Moderate bilateral facet hypertrophy. No significant spinal stenosis. Foramina remain patent. Appearance is relatively stable.  L2-3: Mild disc bulge with disc desiccation. Moderate bilateral facet hypertrophy. Resultant mild spinal stenosis. Foramina remain patent. Appearance is stable.  L3-4: 5 mm anterolisthesis. Associated broad posterior pseudo disc bulge/uncovering, asymmetric to the left. Associated left foraminal to extraforaminal annular fissure. Severe bilateral facet degeneration with associated reactive joint effusions. Tiny 4 mm synovial cyst at the anterior aspect of the right L3-4 facet (series 5, image 9). Resultant moderate to severe canal with bilateral subarticular stenosis, relatively stable from previous. Foramina remain patent.  L4-5: 6 mm anterolisthesis. Associated disc desiccation with mild disc bulge, asymmetric to the right. Severe right worse than left facet degeneration. Resultant severe canal with right worse than left lateral recess stenosis. Appearance is relatively stable. Mild to moderate right L4 foraminal narrowing, also unchanged. Left neural foramina remains patent.  L5-S1: Disc desiccation with minimal disc bulge. Severe left with moderate right facet hypertrophy. No significant canal or lateral recess stenosis. Foramina remain patent. Appearance is stable.  IMPRESSION: 1. Stable multilevel lumbar spondylosis,  most pronounced at L3-4 and L4-5 where there is resultant moderate to severe canal and bilateral subarticular stenosis, with mild to moderate right L4 foraminal narrowing.  2. Fibroid uterus. 3. 2.9 cm left adnexal cyst, indeterminate. Further evaluation with dedicated pelvic ultrasound recommended for further evaluation.   Electronically Signed By: Jeannine Boga M.D. On: 01/24/2020 20:32   Results for orders placed during the hospital encounter of 04/10/18 MR LUMBAR SPINE WO CONTRAST  Narrative CLINICAL DATA:  Chronic bilateral low back pain without sciatica.  EXAM: MRI LUMBAR SPINE WITHOUT CONTRAST  TECHNIQUE: Multiplanar, multisequence MR imaging of the lumbar spine was performed. No intravenous contrast was administered.  COMPARISON:  None.  FINDINGS: Segmentation:  Normal.  Alignment: Slight retrolisthesis L1-2. 5 mm anterolisthesis L3-4. 6 mm anterolisthesis L4-5.  Vertebrae:  Negative for fracture or mass  Conus medullaris and cauda equina: Conus extends to the T12-L1 level. Conus and cauda equina appear normal.  Paraspinal and other soft tissues: Negative for paraspinous mass.  Disc levels:  T12-L1: Mild disc and mild facet degeneration without stenosis  L1-2: Mild disc bulging and mild facet degeneration. Mild spinal stenosis.  L2-3: Mild disc bulging and moderate facet hypertrophy. Mild spinal stenosis. Neural foramina patent bilaterally  L3-4: 5 mm anterolisthesis with severe facet degeneration. Severe spinal stenosis and moderate to severe subarticular stenosis bilaterally  L4-5: 6 mm anterolisthesis with severe facet degeneration bilaterally. Severe spinal stenosis. Severe subarticular stenosis right greater than left  L5-S1: Bilateral facet degeneration.  Negative for stenosis.  IMPRESSION: Mild spinal stenosis L1-2 and L2-3  Grade 1 anterolisthesis and severe spinal stenosis L3-4 and L4-5.   Electronically Signed By: Franchot Gallo M.D. On: 04/10/2018 11:28  Lumbar CT w contrast: Results for orders placed during the hospital encounter of 06/16/20 CT LUMBAR SPINE W CONTRAST  Narrative CLINICAL DATA:  Low back pain.  Question some radicular component.  EXAM: CT MYELOGRAPHY LUMBAR SPINE  TECHNIQUE: CT imaging of the lumbar spine was performed after Isovue 200M contrast administration. Multiplanar CT image reconstructions were also generated.  COMPARISON:  Myelogram images same day.  MRI 01/24/2020.  FINDINGS: Segmentation:  5 lumbar type vertebral bodies.  Alignment: Mild scoliotic curvature convex to the left. 3 mm degenerative anterolisthesis L3-4. 6 mm degenerative anterolisthesis L4-5.  Vertebrae: No fracture or primary bone lesion.  Conus medullaris: Extends to the T12-L1 level and appears normal.  Paraspinal and other soft tissues: Aortic atherosclerosis.  Disc levels:  T11-12 and T12-L1: Normal. Small amount of subdural contrast at dorsal T12, not significant.  L1-2: Mild bulging of the disc. Mild facet and ligamentous hypertrophy. Mild narrowing of the lateral recesses but no neural compression.  L2-3: Mild bulging of the disc. Mild facet and ligamentous hypertrophy. Mild narrowing of the lateral recesses but no neural compression.  L3-4: Bilateral facet arthropathy with 3 mm of anterolisthesis. Degeneration mild bulging of the disc. Tiny synovial cyst projecting inward from the facet on the right. Moderate multifactorial spinal stenosis that could cause neural compression on either or both sides. The facet arthropathy could certainly be painful.  L4-5: Bilateral facet arthropathy with 6 mm of anterolisthesis. Bulging of the disc. Severe multifactorial stenosis of the canal. Bilateral foraminal stenosis. Neural compression could occur on either side at this level. Facet arthropathy could certainly be painful.  L5-S1: Mild bulging of the disc. Bilateral facet osteoarthritis.  No canal or foraminal stenosis. The facet arthropathy could contribute back pain or referred facet syndrome pain.  IMPRESSION: 1. Mild scoliotic curvature convex to the left. 3 mm of degenerative anterolisthesis L3-4. 6 mm degenerative anterolisthesis L4-5. 2. L3-4: Moderate multifactorial spinal stenosis that could cause neural compression on  either or both sides. The facet arthropathy could certainly be painful. 3. L4-5: Severe multifactorial spinal stenosis. Bilateral foraminal stenosis. Neural compression could occur on either side at this level. Facet arthropathy could certainly be painful. 4. L5-S1: Facet arthropathy could contribute back pain or referred facet syndrome pain. 5. Aortic atherosclerosis.  Aortic Atherosclerosis (ICD10-I70.0).   Electronically Signed By: Nelson Chimes M.D. On: 06/16/2020 19:16  Lumbar DG Myelogram views: Results for orders placed during the hospital encounter of 06/16/20 DG Myelogram Lumbar  Narrative CLINICAL DATA:  Lumbar region back pain.  COMPARISON:  MRI 01/24/2020  FLUOROSCOPY TIME:  0 minutes 18 seconds. 37.13 micro gray meter squared  PROCEDURE: LUMBAR MYELOGRAM  Lumbar puncture was performed to the right of midline at the level the T12-L1 interlaminar space. This study was performed by the attending physician at [facility] center. A single fluoroscopic image was obtained.  FINDINGS: LUMBAR MYELOGRAM FINDINGS:  Single image shows wide patency of the spinal canal from T12 through L2. No other myelographic images were obtained.  IMPRESSION: Injection on the right at the T12-L1 interlaminar space. Widely patent spinal canal from T12-L2. No other images obtained. Procedure was performed by the attending physician present today at [facility] medical center.   Electronically Signed By: Nelson Chimes M.D. On: 06/16/2020 19:25  Knee Imaging: Knee-L MR w contrast: Results for orders placed during the  hospital encounter of 11/16/15 MR Knee Left  Wo Contrast  Narrative CLINICAL DATA:  Left knee pain and swelling for 6 months. No known injury. Osteoarthritis. EXAM: MRI OF THE LEFT KNEE WITHOUT CONTRAST TECHNIQUE: Multiplanar, multisequence MR imaging of the knee was performed. No intravenous contrast was administered. COMPARISON:  MRI of the left lower leg 08/11/2015. FINDINGS: MENISCI Medial meniscus: A large horizontal tear is seen throughout the posterior horn reaching the meniscal undersurface. Marked blunting along the free edge is also identified. Complex tearing is present throughout a markedly diminutive and degenerated body. The anterior horn appears preserved. Lateral meniscus:  Intact. LIGAMENTS Cruciates:  Intact. Collaterals:  Intact. CARTILAGE Patellofemoral:  Mildly degenerated. Medial: Markedly thinned throughout with associated joint space narrowing. Lateral:  Mild to moderately degenerated. Joint:  Small joint effusion. Popliteal Fossa: Baker's cyst with septations and some debris measures 2.6 cm transverse by 2.3 cm AP by 7.5 cm craniocaudal. Extensor Mechanism:  Intact. Bones: No fracture or worrisome marrow lesion. Osteophytosis is most prominent about the medial compartment and there is some subchondral edema about the medial compartment. Mildly decreased T1 signal in all imaged bones is consistent with marrow reactivation due to obesity and possibly smoking. Other: None IMPRESSION: Medial compartment osteoarthritis with associated horizontal tearing throughout the posterior horn of the medial meniscus and complex degenerative tearing throughout the meniscal body. Large Baker's cyst with some septations and debris. Electronically Signed By: Inge Rise M.D. On: 11/16/2015 08:44  Complexity Note: Imaging results reviewed. Results shared with Ms. Hardin Negus, using [employer].                        Meds   Current Outpatient Medications:   .  acetaminophen (TYLENOL) 650 MG CR tablet, Take 650-1,300 mg by mouth every 8 (eight) hours as needed for pain. TYLENOL ARTHRITIS, Disp: , Rfl:  .  albuterol (PROVENTIL HFA;VENTOLIN HFA) 108 (90 Base) MCG/ACT inhaler, Inhale 2 puffs into the lungs every 6 (six) hours as needed for wheezing or shortness of breath., Disp: , Rfl:  .  cholestyramine (QUESTRAN) 4  g packet, Take 4 g by mouth 3 (three) times daily with meals., Disp: , Rfl:  .  DULoxetine (CYMBALTA) 30 MG capsule, Take 30 mg by mouth daily., Disp: , Rfl:  .  ELIQUIS 5 MG TABS tablet, TAKE 1 TABLET TWICE DAILY (Patient taking differently: Take 5 mg by mouth 2 (two) times daily.), Disp: 180 tablet, Rfl: 6 .  escitalopram (LEXAPRO) 10 MG tablet, Take 10 mg by mouth daily., Disp: , Rfl:  .  losartan-hydrochlorothiazide (HYZAAR) 100-12.5 MG tablet, Take 1 tablet by mouth daily. Reported on 08/22/2015, Disp: , Rfl:  .  Multiple Vitamins-Minerals (PRESERVISION AREDS 2 PO), Take 1 capsule by mouth 2 (two) times daily. , Disp: , Rfl:  .  oxyCODONE (OXY IR/ROXICODONE) 5 MG immediate release tablet, Take 1 tablet (5 mg total) by mouth every 6 (six) hours as needed for severe pain. Must last 30 days., Disp: 120 tablet, Rfl: 0 .  pantoprazole (PROTONIX) 40 MG tablet, Take 1 tablet (40 mg total) by mouth 2 (two) times daily for 14 days. (Patient taking differently: Take 40 mg by mouth daily.), Disp: 28 tablet, Rfl: 0 .  pravastatin (PRAVACHOL) 40 MG tablet, Take 40 mg by mouth daily. , Disp: , Rfl:  .  sucralfate (CARAFATE) 1 g tablet, Take 1 g by mouth 4 (four) times daily -  with meals and at bedtime., Disp: , Rfl:  .  gabapentin (NEURONTIN) 300 MG capsule, Take 1 capsule (300 mg total) by mouth 2 (two) times daily for 15 days, THEN 1 capsule (300 mg total) 3 (three) times daily for 15 days., Disp: 75 capsule, Rfl: 0  ROS  Constitutional: Denies any fever or chills Gastrointestinal: No reported hemesis, hematochezia, vomiting, or acute GI  distress Musculoskeletal: Denies any acute onset joint swelling, redness, loss of ROM, or weakness Neurological: No reported episodes of acute onset apraxia, aphasia, dysarthria, agnosia, amnesia, paralysis, loss of coordination, or loss of consciousness  Allergies  Ms. Ketter has No Known Allergies.  [place]  Drug: Ms. Eggleton  reports no history of drug use. Alcohol:  reports no history of alcohol use. Tobacco:  reports that she has never smoked. She has never used smokeless tobacco. 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 Surgical: Ms. Kabel  has a past surgical history that includes neck fusion; Wrist surgery (Left, 2000); Cardiac catheterization (N/A, 08/15/2015); IVC FILTER INSERTION (N/A, 06/11/2016); Colonoscopy; Joint replacement (Left, 06/17/2016); Colonoscopy with propofol (N/A, 03/10/2017); IVC FILTER REMOVAL (N/A, 05/06/2017); laparoscopy (N/A, 10/10/2018); laparoscopic appendectomy (N/A, 10/10/2018); Partial colectomy (Right, 10/10/2018); Appendectomy; Esophagogastroduodenoscopy (egd) with propofol (N/A, 02/02/2019); and Breast biopsy (Left, 08/19/04). Family: family history includes Congestive Heart Failure in her father; Heart failure in her father; Pulmonary embolism in her mother.  Constitutional Exam  General appearance: Well nourished, well developed, and well hydrated. In no apparent acute distress Vitals:   06/21/20 1433  Pulse: 69  Resp: 16   Temp: (!) 97 F (36.1 C)  TempSrc: Temporal  SpO2: 96%  Weight: 269 lb (122 kg)  Height: '5\' 5"'  (1.651 m)   BMI Assessment: Estimated body mass index is 44.76 kg/m as calculated from the  following:   Height as of this encounter: '5\' 5"'  (1.651 m).   Weight as of this encounter: 269 lb (122 kg).  BMI interpretation table: BMI level Category Range association with higher incidence of chronic pain  <18 kg/m2 Underweight   18.5-24.9 kg/m2 Ideal body weight   25-29.9 kg/m2 Overweight Increased incidence by 20%  30-34.9 kg/m2 Obese (Class I) Increased incidence by 68%  35-39.9 kg/m2 Severe obesity (Class II) Increased incidence by 136%  >40 kg/m2 Extreme obesity (Class III) Increased incidence by 254%   Patient's current BMI Ideal Body weight  Body mass index is 44.76 kg/m. Ideal body weight: 57 kg (125 lb 10.6 oz) Adjusted ideal body weight: 83 kg (183 lb)   BMI Readings from Last 4 Encounters:  06/21/20 44.76 kg/m  06/16/20 44.76 kg/m  04/24/20 45.76 kg/m  03/06/20 45.77 kg/m   Wt Readings from Last 4 Encounters:  06/21/20 269 lb (122 kg)  06/16/20 269 lb (122 kg)  04/24/20 275 lb (124.7 kg)  03/06/20 275 lb 0.4 oz (124.7 kg)    Psych/Mental status: Alert, oriented x 3 (person, place, & time)       Eyes: PERLA Respiratory: No evidence of acute respiratory distress  Assessment & Plan  Primary Diagnosis & Pertinent Problem List: The primary encounter diagnosis was Chronic pain syndrome. 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), Lumbar foraminal stenosis (Right: L4-5), Chronic lower extremity radicular pain (L1/L2 dermatomes) (Bilateral), Retrolisthesis (L1/L2), Lumbosacral radiculopathy at L1 (Bilateral), Lumbar lateral recess stenosis (Bilateral: L3-4, L4-5) (Severe), Lumbosacral radiculopathy at L5 (Left), Lumbosacral radiculopathy at S1 (Right), Chronic low back pain (2ry  area of Pain) (Bilateral) w/ sciatica (Bilateral), Lumbar facet syndrome (Multilevel) (Bilateral), Anterolisthesis of lumbar spine (L3/L4, 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  Visit Diagnosis: 1. Chronic pain syndrome   2. Chronic lower extremity pain (1ry area of Pain) (Bilateral)   3. 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 Lumbar foraminal stenosis (Right: L4-5)   5. Chronic lower extremity radicular pain (L1/L2 dermatomes) (Bilateral)   6. Retrolisthesis (L1/L2)   7. Lumbosacral radiculopathy at L1 (Bilateral)   8. Lumbar lateral recess stenosis (Bilateral: L3-4, L4-5) (Severe)   9. Lumbosacral radiculopathy at L5 (Left)   10. Lumbosacral radiculopathy at S1 (Right)   11. Chronic low back pain (2ry area of Pain) (Bilateral) w/ sciatica (Bilateral)   12. Lumbar facet syndrome (Multilevel) (Bilateral)   13. 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 Anterolisthesis of lumbosacral spine (L5/S1)   15. Arthropathy of spinal facet joint concurrent with and due to effusion (L3-4)   16. Chronic groin pain (Bilateral)   17. Neurogenic pain, leg, unspecified laterality   18. Shooting pain, intermittent (electrical-like)   19. History of total knee replacement (Bilateral)    20. Abnormal MRI, lumbar spine (01/24/2020)   21. Chronic anticoagulation (Eliquis)   22. Pharmacologic therapy   23. Disorder of skeletal system   24. Problems influencing health status    Problems updated and reviewed  during this visit: Problem  Chronic lower extremity radicular pain (L1/L2 dermatomes) (Bilateral)  Chronic groin pain (Bilateral)  Neurogenic pain, lower extremity (Bilateral)  Shooting pain, intermittent (electrical-like)  History of total knee replacement (Bilateral)    TKR around 2017 by Dr. Leanor Kail.   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 Scoliosis. 2. Trace retrolisthesis of L1 on L2. 3. (5 mm) anterolisthesis of L3 on L4 4. (6 mm) anterolisthesis of L4 on L5 5. trace anterolisthesis of L5 on S1. 6. Findings chronic and facet mediated.   Lumbar lateral recess stenosis (Bilateral: L3-4, L4-5) (Severe)   LEVELS: L3-4: severe canal with bilateral subarticular stenosis L4-5: severe canal with right worse than left lateral recess stenosis   Lumbar foraminal stenosis (Right: L4-5)   LEVELS: L4-5: moderate right L4 foraminal narrowing   Walker As Ambulation Aid   Last Assessment & Plan:  Formatting of this note might be different from the original. Has today  Last Assessment & Plan:  Formatting of this note might be different from the original. Needs continuously   Chronic lower extremity pain (1ry area of Pain) (Bilateral)  Cll (Chronic Lymphocytic Leukemia) (Hcc)   Last Assessment & Plan:  Formatting of this note might be different from the original. Onc is following  Last Assessment & Plan:  Formatting of this note might be different from the original. Onc follows   Healthcare Maintenance   Formatting of this note is different from the original. Images from the original note were not included. MEDICARE WELLNESS VISIT  PROVIDERS RENDERING CARE Dr. Ouida Sills, [place] [facility]  FUNCTIONAL ASSESSMENT  (1) Hearing: Demonstrates normal hearing in conversation.  (2) Risk of Falls: No reports of falls or abnormal balance. Gait is observed to be good upon observation.  (3) Home  Safety; Home is safe and secure (4) Activities of Daily Living; Household chores and grooming are managed without problems. Personal finances are managed without problems.   DEPRESSION SCREENING There does not seem to be loss of interest in activities nor excess crying or changes in sleep or appetite.   COGNITIVE SCREENING Orientation is appropriate as are responses to questions and general conversation. No reports of forgetfulness or losing things.    PREVENTION PLAN Cardiovascular; Yearly cholesterol needed.  Diabetes; Followed glucose yearly Mammogram; Yearly if wanted until 75-80 Bone Density; Needs vit d daily Colon Cancer; Last 2018 with hyperplastic polyps TDAP; 3-12 Glaucoma;Yearly Pneumonia; Pneumovax 2011, prevnar 13  12-17 Shingles; Zostavax 2013 and shingrix prescribed 4-18 Influenza; Yearly Smoking Cessation: Met   OTHER PERSONALIZED HEALTH ADVISE Frequent walking and healthy fat diet  END OF LIFE CARE WANTS Full code but no long term support  Frazier Richards MD  Formatting of this note is different from the original. Images from the original note were not included. MEDICARE WELLNESS VISIT  PROVIDERS RENDERING CARE Dr. Ouida Sills, [place] [facility]  FUNCTIONAL ASSESSMENT  (1) Hearing: Demonstrates normal hearing in conversation.  (2) Risk of Falls: No reports of falls or abnormal balance. Gait is observed to be good upon observation.  (3) Home Safety; Home is safe and secure (4) Activities of Daily Living; Household chores and grooming are managed without problems. Personal finances are managed without problems.   DEPRESSION SCREENING There does not seem to be loss of interest in activities nor excess crying or changes in sleep or appetite.   COGNITIVE SCREENING Orientation is appropriate as are responses to questions and general conversation. No reports of forgetfulness or losing things.    PREVENTION PLAN Cardiovascular; Yearly cholesterol  needed.  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 4-17, eliquis started: with cll will continue  Last Assessment & Plan:  Formatting of this note might be different from the original. No cp or sob  Formatting of this note might be different from the original. 4-17, eliquis started: with cll will continue  Last Assessment & Plan:  Formatting of this note might be different from the original. Staying on eliquis long term given cll   Prediabetes   Last Assessment & Plan:  Formatting of this note might be different from the original. 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 Mood is doing well currently, previously with multiple symptoms  Last Assessment & Plan:  Formatting of this note might be different from the original. Mood continues to do well     Plan of Care  Pharmacotherapy (Medications Ordered): Meds ordered this encounter  Medications  . gabapentin (NEURONTIN) 300 MG capsule    Sig: Take 1 capsule (300 mg total) by mouth 2 (two) times daily for 15 days, THEN 1 capsule (300 mg total) 3 (three) times daily for 15 days.    Dispense:  75 capsule    Refill:  0  . oxyCODONE (OXY IR/ROXICODONE) 5 MG immediate release tablet    Sig: Take 1 tablet (5 mg total) by mouth every 6 (six) hours as needed for severe pain.  Must last 30 days.    Dispense:  120 tablet    Refill:  0    Chronic Pain: STOP Act (Not applicable) Fill 1 day early if closed on refill date. Avoid benzodiazepines within 8 hours of opioids    Procedure Orders     Lumbar Epidural Injection     Lumbar Transforaminal Epidural  Lab Orders     Comp. Metabolic Panel (12)     Magnesium     Vitamin B12     Sedimentation rate     25-Hydroxy vitamin D Lcms D2+D3     C-reactive protein Imaging Orders  No imaging studies ordered today   Referral Orders  No referral(s) requested today    Pharmacological management options:  Opioid Analgesics: We'll take over management today. See above orders Membrane stabilizer: Options discussed, including a trial. Muscle relaxant: We have discussed the possibility of a trial NSAID: Trial discussed. Other analgesic(s): To be determined at a later time     Interventional Therapies  Risk  Complexity Considerations:   WNL   Planned  Pending:   Pending further evaluation   Under consideration:   Diagnostic midline T12-L1 LESI    Completed:   None at this time   Therapeutic  Palliative (PRN) options:   None established    Provider-requested follow-up: Return for Procedure (w/ sedation): (R) L2-3 LESI #1 + (B) L3-4 TFESI #1, (Blood Thinner Protocol). Recent Visits Date Type Provider Dept  04/24/20 Office Visit Milinda Pointer, MD [facility]  Showing recent visits within past 90 days and meeting all other requirements Today's Visits Date Type Provider Dept  06/21/20 Office Visit Milinda Pointer, MD [facility]  Showing today's visits and meeting all other requirements Future Appointments No visits were found meeting these conditions. Showing future appointments within next 90 days and meeting all other requirements  Primary Care Physician: Jamie Ruths, MD Note by: Jamie Cola, MD Date: 06/21/2020; Time: 4:52 PM

## 2020-06-21 ENCOUNTER — Encounter: Payer: Self-pay | Admitting: Pain Medicine

## 2020-06-21 ENCOUNTER — Ambulatory Visit: Payer: Medicare HMO | Attending: Pain Medicine | Admitting: Pain Medicine

## 2020-06-21 ENCOUNTER — Other Ambulatory Visit: Payer: Self-pay

## 2020-06-21 VITALS — HR 69 | Temp 97.0°F | Resp 16 | Ht 65.0 in | Wt 269.0 lb

## 2020-06-21 DIAGNOSIS — M48061 Spinal stenosis, lumbar region without neurogenic claudication: Secondary | ICD-10-CM | POA: Diagnosis not present

## 2020-06-21 DIAGNOSIS — M48062 Spinal stenosis, lumbar region with neurogenic claudication: Secondary | ICD-10-CM | POA: Diagnosis not present

## 2020-06-21 DIAGNOSIS — M47819 Spondylosis without myelopathy or radiculopathy, site unspecified: Secondary | ICD-10-CM | POA: Diagnosis present

## 2020-06-21 DIAGNOSIS — M4316 Spondylolisthesis, lumbar region: Secondary | ICD-10-CM

## 2020-06-21 DIAGNOSIS — M5442 Lumbago with sciatica, left side: Secondary | ICD-10-CM | POA: Diagnosis not present

## 2020-06-21 DIAGNOSIS — R1032 Left lower quadrant pain: Secondary | ICD-10-CM

## 2020-06-21 DIAGNOSIS — G894 Chronic pain syndrome: Secondary | ICD-10-CM

## 2020-06-21 DIAGNOSIS — R1031 Right lower quadrant pain: Secondary | ICD-10-CM | POA: Diagnosis not present

## 2020-06-21 DIAGNOSIS — Z96653 Presence of artificial knee joint, bilateral: Secondary | ICD-10-CM

## 2020-06-21 DIAGNOSIS — M79604 Pain in right leg: Secondary | ICD-10-CM | POA: Diagnosis not present

## 2020-06-21 DIAGNOSIS — M47816 Spondylosis without myelopathy or radiculopathy, lumbar region: Secondary | ICD-10-CM

## 2020-06-21 DIAGNOSIS — M5417 Radiculopathy, lumbosacral region: Secondary | ICD-10-CM | POA: Diagnosis not present

## 2020-06-21 DIAGNOSIS — G8929 Other chronic pain: Secondary | ICD-10-CM

## 2020-06-21 DIAGNOSIS — Z789 Other specified health status: Secondary | ICD-10-CM

## 2020-06-21 DIAGNOSIS — M792 Neuralgia and neuritis, unspecified: Secondary | ICD-10-CM | POA: Diagnosis present

## 2020-06-21 DIAGNOSIS — M5441 Lumbago with sciatica, right side: Secondary | ICD-10-CM

## 2020-06-21 DIAGNOSIS — R52 Pain, unspecified: Secondary | ICD-10-CM

## 2020-06-21 DIAGNOSIS — M5410 Radiculopathy, site unspecified: Secondary | ICD-10-CM

## 2020-06-21 DIAGNOSIS — Z79899 Other long term (current) drug therapy: Secondary | ICD-10-CM | POA: Diagnosis not present

## 2020-06-21 DIAGNOSIS — M79605 Pain in left leg: Secondary | ICD-10-CM | POA: Diagnosis not present

## 2020-06-21 DIAGNOSIS — R937 Abnormal findings on diagnostic imaging of other parts of musculoskeletal system: Secondary | ICD-10-CM | POA: Diagnosis present

## 2020-06-21 DIAGNOSIS — Z7901 Long term (current) use of anticoagulants: Secondary | ICD-10-CM

## 2020-06-21 DIAGNOSIS — M4317 Spondylolisthesis, lumbosacral region: Secondary | ICD-10-CM

## 2020-06-21 DIAGNOSIS — M2540 Effusion, unspecified joint: Secondary | ICD-10-CM

## 2020-06-21 DIAGNOSIS — M899 Disorder of bone, unspecified: Secondary | ICD-10-CM

## 2020-06-21 DIAGNOSIS — M4310 Spondylolisthesis, site unspecified: Secondary | ICD-10-CM

## 2020-06-21 MED ORDER — OXYCODONE HCL 5 MG PO TABS: 5.0000 mg | ORAL_TABLET | Freq: Four times a day (QID) | ORAL | 0 refills | Status: DC | PRN

## 2020-06-21 MED ORDER — GABAPENTIN 300 MG PO CAPS: ORAL_CAPSULE | ORAL | 0 refills | Status: DC

## 2020-06-21 NOTE — Progress Notes (Signed)
Safety precautions to be maintained throughout the outpatient stay will include: orient to surroundings, keep bed in low position, maintain call bell within reach at all times, provide assistance with transfer out of bed and ambulation.  

## 2020-06-21 NOTE — Patient Instructions (Addendum)
____________________________________________________________________________________________  Medication Rules  Purpose: To inform patients, and their family members, of our rules and regulations.  Applies to: All patients receiving prescriptions (written or electronic).  Pharmacy of record: Pharmacy where electronic prescriptions will be sent. If written prescriptions are taken to a different pharmacy, please inform the nursing staff. The pharmacy listed in the electronic medical record should be the one where you would like electronic prescriptions to be sent.  Electronic prescriptions: In compliance with the [place] Strengthen Opioid Misuse Prevention (STOP) Act of 2017 (Session Law 2017-74/H243), effective April 22, 2018, all controlled substances must be electronically prescribed. Calling prescriptions to the pharmacy will cease to exist.  Prescription refills: Only during scheduled appointments. Applies to all prescriptions.  NOTE: The following applies primarily to controlled substances (Opioid* Pain Medications).   Type of encounter (visit): For patients receiving controlled substances, face-to-face visits are required. (Not an option or up to the patient.)  Patient's responsibilities: 1. Pain Pills: Bring all pain pills to every appointment (except for procedure appointments). 2. Pill Bottles: Bring pills in original pharmacy bottle. Always bring the newest bottle. Bring bottle, even if empty. 3. Medication refills: You are responsible for knowing and keeping track of what medications you take and those you need refilled. The day before your appointment: write a list of all prescriptions that need to be refilled. The day of the appointment: give the list to the admitting nurse. Prescriptions will be written only during appointments. No prescriptions will be written on procedure days. If you forget a medication: it will not be "Called in", "Faxed", or "electronically sent".  You will need to get another appointment to get these prescribed. No early refills. Do not call asking to have your prescription filled early. 4. Prescription Accuracy: You are responsible for carefully inspecting your prescriptions before leaving our office. Have the discharge nurse carefully go over each prescription with you, before taking them home. Make sure that your name is accurately spelled, that your address is correct. Check the name and dose of your medication to make sure it is accurate. Check the number of pills, and the written instructions to make sure they are clear and accurate. Make sure that you are given enough medication to last until your next medication refill appointment. 5. Taking Medication: Take medication as prescribed. When it comes to controlled substances, taking less pills or less frequently than prescribed is permitted and encouraged. Never take more pills than instructed. Never take medication more frequently than prescribed.  6. Inform other Doctors: Always inform, all of your healthcare providers, of all the medications you take. 7. Pain Medication from other Providers: You are not allowed to accept any additional pain medication from any other Doctor or Healthcare provider. There are two exceptions to this rule. (see below) In the event that you require additional pain medication, you are responsible for notifying us, as stated below. 8. Cough Medicine: Often these contain an opioid, such as codeine or hydrocodone. Never accept or take cough medicine containing these opioids if you are already taking an opioid* medication. The combination may cause respiratory failure and death. 9. Medication Agreement: You are responsible for carefully reading and following our Medication Agreement. This must be signed before receiving any prescriptions from our practice. Safely store a copy of your signed Agreement. Violations to the Agreement will result in no further prescriptions.  (Additional copies of our Medication Agreement are available upon request.) 10. Laws, Rules, & Regulations: All patients are expected to follow all   Federal and State Laws, Statutes, Rules, & Regulations. Ignorance of the Laws does not constitute a valid excuse.  11. Illegal drugs and Controlled Substances: The use of illegal substances (including, but not limited to marijuana and its derivatives) and/or the illegal use of any controlled substances is strictly prohibited. Violation of this rule may result in the immediate and permanent discontinuation of any and all prescriptions being written by our practice. The use of any illegal substances is prohibited. 12. Adopted CDC guidelines & recommendations: Target dosing levels will be at or below 60 MME/day. Use of benzodiazepines** is not recommended.  Exceptions: There are only two exceptions to the rule of not receiving pain medications from other Healthcare Providers. 1. Exception #1 (Emergencies): In the event of an emergency (i.e.: accident requiring emergency care), you are allowed to receive additional pain medication. However, you are responsible for: As soon as you are able, call our office [phone], at any time of the day or night, and leave a message stating your name, the date and nature of the emergency, and the name and dose of the medication prescribed. In the event that your call is answered by a member of our staff, make sure to document and save the date, time, and the name of the person that took your information.  2. Exception #2 (Planned Surgery): In the event that you are scheduled by another doctor or dentist to have any type of surgery or procedure, you are allowed (for a period no longer than 30 days), to receive additional pain medication, for the acute post-op pain. However, in this case, you are responsible for picking up a copy of our "Post-op Pain Management for Surgeons" handout, and giving it to your surgeon or dentist. This  document is available at our office, and does not require an appointment to obtain it. Simply go to our office during business hours (Monday-Thursday from 8:00 AM to 4:00 PM) (Friday 8:00 AM to 12:00 Noon) or if you have a scheduled appointment with us, prior to your surgery, and ask for it by name. In addition, you are responsible for: calling our office [phone], at any time of the day or night, and leaving a message stating your name, name of your surgeon, type of surgery, and date of procedure or surgery. Failure to comply with your responsibilities may result in termination of therapy involving the controlled substances.  *Opioid medications include: morphine, codeine, oxycodone, oxymorphone, hydrocodone, hydromorphone, meperidine, tramadol, tapentadol, buprenorphine, fentanyl, methadone. **Benzodiazepine medications include: diazepam (Valium), alprazolam (Xanax), clonazepam (Klonopine), lorazepam (Ativan), clorazepate (Tranxene), chlordiazepoxide (Librium), estazolam (Prosom), oxazepam (Serax), temazepam (Restoril), triazolam (Halcion) (Last updated: 03/20/2020) ____________________________________________________________________________________________   ____________________________________________________________________________________________  Medication Recommendations and Reminders  Applies to: All patients receiving prescriptions (written and/or electronic).  Medication Rules & Regulations: These rules and regulations exist for your safety and that of others. They are not flexible and neither are we. Dismissing or ignoring them will be considered "non-compliance" with medication therapy, resulting in complete and irreversible termination of such therapy. (See document titled "Medication Rules" for more details.) In all conscience, because of safety reasons, we cannot continue providing a therapy where the patient does not follow instructions.  Pharmacy of record:   Definition:  This is the pharmacy where your electronic prescriptions will be sent.   We do not endorse any particular pharmacy, however, we have experienced problems with [facility] not securing enough medication supply for the community.  We do not restrict you in your choice of pharmacy. However,   once we write for your prescriptions, we will NOT be re-sending more prescriptions to fix restricted supply problems created by your pharmacy, or your insurance.   The pharmacy listed in the electronic medical record should be the one where you want electronic prescriptions to be sent.  If you choose to change pharmacy, simply notify our nursing staff.  Recommendations:  Keep all of your pain medications in a safe place, under lock and key, even if you live alone. We will NOT replace lost, stolen, or damaged medication.  After you fill your prescription, take 1 week's worth of pills and put them away in a safe place. You should keep a separate, properly labeled bottle for this purpose. The remainder should be kept in the original bottle. Use this as your primary supply, until it runs out. Once it's gone, then you know that you have 1 week's worth of medicine, and it is time to come in for a prescription refill. If you do this correctly, it is unlikely that you will ever run out of medicine.  To make sure that the above recommendation works, it is very important that you make sure your medication refill appointments are scheduled at least 1 week before you run out of medicine. To do this in an effective manner, make sure that you do not leave the office without scheduling your next medication management appointment. Always ask the nursing staff to show you in your prescription , when your medication will be running out. Then arrange for the receptionist to get you a return appointment, at least 7 days before you run out of medicine. Do not wait until you have 1 or 2 pills left, to come in. This is very poor planning and  does not take into consideration that we may need to cancel appointments due to bad weather, sickness, or emergencies affecting our staff.  DO NOT ACCEPT A "Partial Fill": If for any reason your pharmacy does not have enough pills/tablets to completely fill or refill your prescription, do not allow for a "partial fill". The law allows the pharmacy to complete that prescription within 72 hours, without requiring a new prescription. If they do not fill the rest of your prescription within those 72 hours, you will need a separate prescription to fill the remaining amount, which we will NOT provide. If the reason for the partial fill is your insurance, you will need to talk to the pharmacist about payment alternatives for the remaining tablets, but again, DO NOT ACCEPT A PARTIAL FILL, unless you can trust your pharmacist to obtain the remainder of the pills within 72 hours.  Prescription refills and/or changes in medication(s):   Prescription refills, and/or changes in dose or medication, will be conducted only during scheduled medication management appointments. (Applies to both, written and electronic prescriptions.)  No refills on procedure days. No medication will be changed or started on procedure days. No changes, adjustments, and/or refills will be conducted on a procedure day. Doing so will interfere with the diagnostic portion of the procedure.  No phone refills. No medications will be "called into the pharmacy".  No Fax refills.  No weekend refills.  No Holliday refills.  No after hours refills.  Remember:  Business hours are:  Monday to Thursday 8:00 AM to 4:00 PM Provider's Schedule: Avrie Kedzierski, MD - Appointments are:  Medication management: Monday and Wednesday 8:00 AM to 4:00 PM Procedure day: Tuesday and Thursday 7:30 AM to 4:00 PM Bilal Lateef, MD - Appointments are:    Medication management: Tuesday and Thursday 8:00 AM to 4:00 PM Procedure day: Monday and Wednesday  7:30 AM to 4:00 PM (Last update: 11/10/2019) ____________________________________________________________________________________________   ____________________________________________________________________________________________  CBD (cannabidiol) WARNING  Applicable to: All individuals currently taking or considering taking CBD (cannabidiol) and, more important, all patients taking opioid analgesic controlled substances (pain medication). (Example: oxycodone; oxymorphone; hydrocodone; hydromorphone; morphine; methadone; tramadol; tapentadol; fentanyl; buprenorphine; butorphanol; dextromethorphan; meperidine; codeine; etc.)  Legal status: CBD remains a Schedule I drug prohibited for any use. CBD is illegal with one exception. In the [place], CBD has a limited Transport planner (FDA) approval for the treatment of two specific types of epilepsy disorders. Only one CBD product has been approved by the FDA for this purpose: "Epidiolex". FDA is aware that some companies are marketing products containing cannabis and cannabis-derived compounds in ways that violate the [employer], Drug and Cosmetic Act [facility] Act) and that may put the health and safety of consumers at risk. The FDA, a Federal agency, has not enforced the CBD status since 2018.   Legality: Some manufacturers ship CBD products nationally, which is illegal. Often such products are sold online and are therefore available throughout the country. CBD is openly sold in head shops and health food stores in some states where such sales have not been explicitly legalized. Selling unapproved products with unsubstantiated therapeutic claims is not only a violation of the law, but also can put patients at risk, as these products have not been proven to be safe or effective. Federal illegality makes it difficult to conduct research on CBD.  Reference: "FDA Regulation of Cannabis and Cannabis-Derived Products, Including Cannabidiol  (CBD)" - [employer]  Warning: CBD is not FDA approved and has not undergo the same manufacturing controls as prescription drugs.  This means that the purity and safety of available CBD may be questionable. Most of the time, despite manufacturer's claims, it is contaminated with THC (delta-9-tetrahydrocannabinol - the chemical in marijuana responsible for the "HIGH").  When this is the case, the [facility] contaminant will trigger a positive urine drug screen (UDS) test for Marijuana (carboxy-THC). Because a positive UDS for any illicit substance is a violation of our medication agreement, your opioid analgesics (pain medicine) may be permanently discontinued.  MORE ABOUT CBD  General Information: CBD  is a derivative of the Marijuana (cannabis sativa) plant discovered in 41. It is one of the 113 identified substances found in Marijuana. It accounts for up to 40% of the plant's extract. As of 2018, preliminary clinical studies on CBD included research for the treatment of anxiety, movement disorders, and pain. CBD is available and consumed in multiple forms, including inhalation of smoke or vapor, as an aerosol spray, and by mouth. It may be supplied as an oil containing CBD, capsules, dried cannabis, or as a liquid solution. CBD is thought not to be as psychoactive as THC (delta-9-tetrahydrocannabinol - the chemical in marijuana responsible for the "HIGH"). Studies suggest that CBD may interact with different biological target receptors in the body, including cannabinoid and other neurotransmitter receptors. As of 2018 the mechanism of action for its biological effects has not been determined.  Side-effects  Adverse reactions: Dry mouth, diarrhea, decreased appetite, fatigue, drowsiness, malaise, weakness, sleep disturbances, and others.  Drug interactions: CBC may interact with other  medications such as blood-thinners. (Last update: 11/27/2019) ____________________________________________________________________________________________   ____________________________________________________________________________________________  Preparing for Procedure with Sedation  Procedure appointments are limited to planned procedures: . No Prescription Refills. . No disability issues  will be discussed. . No medication changes will be discussed.  Instructions: . Oral Intake: Do not eat or drink anything for at least 8 hours prior to your procedure. (Exception: Blood Pressure Medication. See below.) . Transportation: Unless otherwise stated by your physician, you may drive yourself after the procedure. . Blood Pressure Medicine: Do not forget to take your blood pressure medicine with a sip of water the morning of the procedure. If your Diastolic (lower reading)is above 100 mmHg, elective cases will be cancelled/rescheduled. . Blood thinners: These will need to be stopped for procedures. Notify our staff if you are taking any blood thinners. Depending on which one you take, there will be specific instructions on how and when to stop it. . Diabetics on insulin: Notify the staff so that you can be scheduled 1st case in the morning. If your diabetes requires high dose insulin, take only  of your normal insulin dose the morning of the procedure and notify the staff that you have done so. . Preventing infections: Shower with an antibacterial soap the morning of your procedure. . Build-up your immune system: Take 1000 mg of Vitamin C with every meal (3 times a day) the day prior to your procedure. Marland Kitchen Antibiotics: Inform the staff if you have a condition or reason that requires you to take antibiotics before dental procedures. . Pregnancy: If you are pregnant, call and cancel the procedure. . Sickness: If you have a cold, fever, or any active infections, call and cancel the  procedure. . Arrival: You must be in the facility at least 30 minutes prior to your scheduled procedure. . Children: Do not bring children with you. . Dress appropriately: Bring dark clothing that you would not mind if they get stained. . Valuables: Do not bring any jewelry or valuables.  Reasons to call and reschedule or cancel your procedure: (Following these recommendations will minimize the risk of a serious complication.) . Surgeries: Avoid having procedures within 2 weeks of any surgery. (Avoid for 2 weeks before or after any surgery). . Flu Shots: Avoid having procedures within 2 weeks of a flu shots or . (Avoid for 2 weeks before or after immunizations). . Barium: Avoid having a procedure within 7-10 days after having had a radiological study involving the use of radiological contrast. (Myelograms, Barium swallow or enema study). . Heart attacks: Avoid any elective procedures or surgeries for the initial 6 months after a "Myocardial Infarction" (Heart Attack). . Blood thinners: It is imperative that you stop these medications before procedures. Let us know if you if you take any blood thinner.  . Infection: Avoid procedures during or within two weeks of an infection (including chest colds or gastrointestinal problems). Symptoms associated with infections include: Localized redness, fever, chills, night sweats or profuse sweating, burning sensation when voiding, cough, congestion, stuffiness, runny nose, sore throat, diarrhea, nausea, vomiting, cold or Flu symptoms, recent or current infections. It is specially important if the infection is over the area that we intend to treat. Marland Kitchen Heart and lung problems: Symptoms that may suggest an active cardiopulmonary problem include: cough, chest pain, breathing difficulties or shortness of breath, dizziness, ankle swelling, uncontrolled high or unusually low blood pressure, and/or palpitations. If you are experiencing any of these symptoms, cancel your  procedure and contact your primary care physician for an evaluation.  Remember:  Regular Business hours are:  Monday to Thursday 8:00 AM to 4:00 PM  Provider's Schedule: Milinda Pointer, MD:  Procedure days: Tuesday and Thursday 7:30  AM to 4:00 PM  Gillis Santa, MD:  Procedure days: Monday and Wednesday 7:30 AM to 4:00 PM ____________________________________________________________________________________________   ____________________________________________________________________________________________  General Risks and Possible Complications  Patient Responsibilities: It is important that you read this as it is part of your informed consent. It is our duty to inform you of the risks and possible complications associated with treatments offered to you. It is your responsibility as a patient to read this and to ask questions about anything that is not clear or that you believe was not covered in this document.  Patient's Rights: You have the right to refuse treatment. You also have the right to change your mind, even after initially having agreed to have the treatment done. However, under this last option, if you wait until the last second to change your mind, you may be charged for the materials used up to that point.  Introduction: Medicine is not an Chief Strategy Officer. Everything in Medicine, including the lack of treatment(s), carries the potential for danger, harm, or loss (which is by definition: Risk). In Medicine, a complication is a secondary problem, condition, or disease that can aggravate an already existing one. All treatments carry the risk of possible complications. The fact that a side effects or complications occurs, does not imply that the treatment was conducted incorrectly. It must be clearly understood that these can happen even when everything is done following the highest safety standards.  No treatment: You can choose not to proceed with the proposed treatment  alternative. The "PRO(s)" would include: avoiding the risk of complications associated with the therapy. The "CON(s)" would include: not getting any of the treatment benefits. These benefits fall under one of three categories: diagnostic; therapeutic; and/or palliative. Diagnostic benefits include: getting information which can ultimately lead to improvement of the disease or symptom(s). Therapeutic benefits are those associated with the successful treatment of the disease. Finally, palliative benefits are those related to the decrease of the primary symptoms, without necessarily curing the condition (example: decreasing the pain from a flare-up of a chronic condition, such as incurable terminal cancer).  General Risks and Complications: These are associated to most interventional treatments. They can occur alone, or in combination. They fall under one of the following six (6) categories: no benefit or worsening of symptoms; bleeding; infection; nerve damage; allergic reactions; and/or death. 1. No benefits or worsening of symptoms: In Medicine there are no guarantees, only probabilities. No healthcare provider can ever guarantee that a medical treatment will work, they can only state the probability that it may. Furthermore, there is always the possibility that the condition may worsen, either directly, or indirectly, as a consequence of the treatment. 2. Bleeding: This is more common if the patient is taking a blood thinner, either prescription or over the counter (example: Goody Powders, Fish oil, Aspirin, Garlic, etc.), or if suffering a condition associated with impaired coagulation (example: Hemophilia, cirrhosis of the liver, low platelet counts, etc.). However, even if you do not have one on these, it can still happen. If you have any of these conditions, or take one of these drugs, make sure to notify your treating physician. 3. Infection: This is more common in patients with a compromised immune  system, either due to disease (example: diabetes, cancer, human immunodeficiency virus [HIV], etc.), or due to medications or treatments (example: therapies used to treat cancer and rheumatological diseases). However, even if you do not have one on these, it can still happen. If you have any of these  conditions, or take one of these drugs, make sure to notify your treating physician. 4. Nerve Damage: This is more common when the treatment is an invasive one, but it can also happen with the use of medications, such as those used in the treatment of cancer. The damage can occur to small secondary nerves, or to large primary ones, such as those in the spinal cord and brain. This damage may be temporary or permanent and it may lead to impairments that can range from temporary numbness to permanent paralysis and/or brain death. 5. Allergic Reactions: Any time a substance or material comes in contact with our body, there is the possibility of an allergic reaction. These can range from a mild skin rash (contact dermatitis) to a severe systemic reaction (anaphylactic reaction), which can result in death. 6. Death: In general, any medical intervention can result in death, most of the time due to an unforeseen complication. ____________________________________________________________________________________________  ____________________________________________________________________________________________  Blood Thinners  IMPORTANT NOTICE:  If you take any of these, make sure to notify the nursing staff.  Failure to do so may result in injury.  Recommended time intervals to stop and restart blood-thinners, before & after invasive procedures  Generic Name Brand Name Stop Time. Must be stopped at least this long before procedures. After procedures, wait at least this long before re-starting.  Abciximab Reopro 15 days 2 hrs  Alteplase Activase 10 days 10 days  Anagrelide Agrylin    Apixaban Eliquis 3 days 6  hrs  Cilostazol Pletal 3 days 5 hrs  Clopidogrel Plavix 7-10 days 2 hrs  Dabigatran Pradaxa 5 days 6 hrs  Dalteparin Fragmin 24 hours 4 hrs  Dipyridamole Aggrenox 11days 2 hrs  Edoxaban Lixiana; Savaysa 3 days 2 hrs  Enoxaparin  Lovenox 24 hours 4 hrs  Eptifibatide Integrillin 8 hours 2 hrs  Fondaparinux  Arixtra 72 hours 12 hrs  Hydroxychloroquine Plaquenil 11 days   Prasugrel Effient 7-10 days 6 hrs  Reteplase Retavase 10 days 10 days  Rivaroxaban Xarelto 3 days 6 hrs  Ticagrelor Brilinta 5-7 days 6 hrs  Ticlopidine Ticlid 10-14 days 2 hrs  Tinzaparin Innohep 24 hours 4 hrs  Tirofiban Aggrastat 8 hours 2 hrs  Warfarin Coumadin 5 days 2 hrs   Other medications with blood-thinning effects  Product indications Generic (Brand) names Note  Cholesterol Lipitor Stop 4 days before procedure  Blood thinner (injectable) Heparin (LMW or LMWH Heparin) Stop 24 hours before procedure  Cancer Ibrutinib (Imbruvica) Stop 7 days before procedure  Malaria/Rheumatoid Hydroxychloroquine (Plaquenil) Stop 11 days before procedure  Thrombolytics  10 days before or after procedures   Over-the-counter (OTC) Products with blood-thinning effects  Product Common names Stop Time  Aspirin > 325 mg Goody Powders, Excedrin, etc. 11 days  Aspirin ? 81 mg  7 days  Fish oil  4 days  Garlic supplements  7 days  Ginkgo biloba  36 hours  Ginseng  24 hours  NSAIDs Ibuprofen, Naprosyn, etc. 3 days  Vitamin E  4 days   ____________________________________________________________________________________________

## 2020-06-27 ENCOUNTER — Telehealth: Payer: Self-pay | Admitting: *Deleted

## 2020-06-27 LAB — VITAMIN B12: Vitamin B-12: 532 pg/mL (ref 232–1245)

## 2020-06-27 LAB — SEDIMENTATION RATE: Sed Rate: 21 mm/hr (ref 0–40)

## 2020-06-27 LAB — COMP. METABOLIC PANEL (12)
AST: 16 IU/L (ref 0–40)
Albumin/Globulin Ratio: 2.2 (ref 1.2–2.2)
Albumin: 4.2 g/dL (ref 3.7–4.7)
Alkaline Phosphatase: 104 IU/L (ref 44–121)
BUN/Creatinine Ratio: 17 (ref 12–28)
BUN: 16 mg/dL (ref 8–27)
Bilirubin Total: 0.4 mg/dL (ref 0.0–1.2)
Calcium: 9.5 mg/dL (ref 8.7–10.3)
Chloride: 103 mmol/L (ref 96–106)
Creatinine, Ser: 0.93 mg/dL (ref 0.57–1.00)
Globulin, Total: 1.9 g/dL (ref 1.5–4.5)
Glucose: 118 mg/dL — ABNORMAL HIGH (ref 65–99)
Potassium: 4.5 mmol/L (ref 3.5–5.2)
Sodium: 144 mmol/L (ref 134–144)
Total Protein: 6.1 g/dL (ref 6.0–8.5)
eGFR: 63 mL/min/{1.73_m2} (ref >59)

## 2020-06-27 LAB — MAGNESIUM: Magnesium: 1.9 mg/dL (ref 1.6–2.3)

## 2020-06-27 LAB — 25-HYDROXY VITAMIN D LCMS D2+D3
25-Hydroxy, Vitamin D-2: <1 ng/mL
25-Hydroxy, Vitamin D-3: 14 ng/mL
25-Hydroxy, Vitamin D: 14 ng/mL — ABNORMAL LOW

## 2020-06-27 LAB — C-REACTIVE PROTEIN: CRP: 9 mg/L (ref 0–10)

## 2020-06-27 NOTE — Telephone Encounter (Signed)
Instructed to stop Eliquis 3 days before upcoming procedure.

## 2020-06-29 NOTE — Progress Notes (Signed)
This patient had nerve conduction studies on 05-09-20, Ive laid results on your desk. Do you still want her to have again?

## 2020-07-06 DIAGNOSIS — G8929 Other chronic pain: Secondary | ICD-10-CM | POA: Diagnosis not present

## 2020-07-06 DIAGNOSIS — Z79899 Other long term (current) drug therapy: Secondary | ICD-10-CM | POA: Diagnosis not present

## 2020-07-06 DIAGNOSIS — K219 Gastro-esophageal reflux disease without esophagitis: Secondary | ICD-10-CM | POA: Diagnosis not present

## 2020-07-06 DIAGNOSIS — E785 Hyperlipidemia, unspecified: Secondary | ICD-10-CM | POA: Diagnosis not present

## 2020-07-06 DIAGNOSIS — M5441 Lumbago with sciatica, right side: Secondary | ICD-10-CM | POA: Diagnosis not present

## 2020-07-06 DIAGNOSIS — I129 Hypertensive chronic kidney disease with stage 1 through stage 4 chronic kidney disease, or unspecified chronic kidney disease: Secondary | ICD-10-CM | POA: Diagnosis not present

## 2020-07-06 DIAGNOSIS — M5442 Lumbago with sciatica, left side: Secondary | ICD-10-CM | POA: Diagnosis not present

## 2020-07-06 DIAGNOSIS — Z7901 Long term (current) use of anticoagulants: Secondary | ICD-10-CM | POA: Diagnosis not present

## 2020-07-06 DIAGNOSIS — N189 Chronic kidney disease, unspecified: Secondary | ICD-10-CM | POA: Diagnosis not present

## 2020-07-11 ENCOUNTER — Encounter: Payer: Self-pay | Admitting: Pain Medicine

## 2020-07-11 ENCOUNTER — Ambulatory Visit (HOSPITAL_BASED_OUTPATIENT_CLINIC_OR_DEPARTMENT_OTHER): Payer: Medicare HMO | Admitting: Pain Medicine

## 2020-07-11 ENCOUNTER — Other Ambulatory Visit: Payer: Self-pay

## 2020-07-11 ENCOUNTER — Ambulatory Visit
Admission: RE | Admit: 2020-07-11 | Discharge: 2020-07-11 | Disposition: A | Discharge status: Home / Self Care | Payer: Medicare HMO | Source: Ambulatory Visit | Attending: Pain Medicine | Admitting: Pain Medicine

## 2020-07-11 VITALS — BP 118/67 | HR 61 | Temp 97.6°F | Resp 16 | Ht 65.0 in | Wt 269.0 lb

## 2020-07-11 DIAGNOSIS — G8929 Other chronic pain: Secondary | ICD-10-CM | POA: Diagnosis present

## 2020-07-11 DIAGNOSIS — M792 Neuralgia and neuritis, unspecified: Secondary | ICD-10-CM

## 2020-07-11 DIAGNOSIS — M4316 Spondylolisthesis, lumbar region: Secondary | ICD-10-CM | POA: Insufficient documentation

## 2020-07-11 DIAGNOSIS — M5136 Other intervertebral disc degeneration, lumbar region: Secondary | ICD-10-CM | POA: Diagnosis not present

## 2020-07-11 DIAGNOSIS — M48061 Spinal stenosis, lumbar region without neurogenic claudication: Secondary | ICD-10-CM | POA: Diagnosis not present

## 2020-07-11 DIAGNOSIS — M79605 Pain in left leg: Secondary | ICD-10-CM | POA: Diagnosis present

## 2020-07-11 DIAGNOSIS — M4310 Spondylolisthesis, site unspecified: Secondary | ICD-10-CM | POA: Diagnosis not present

## 2020-07-11 DIAGNOSIS — Z79899 Other long term (current) drug therapy: Secondary | ICD-10-CM | POA: Insufficient documentation

## 2020-07-11 DIAGNOSIS — M5417 Radiculopathy, lumbosacral region: Secondary | ICD-10-CM | POA: Insufficient documentation

## 2020-07-11 DIAGNOSIS — M48062 Spinal stenosis, lumbar region with neurogenic claudication: Secondary | ICD-10-CM | POA: Diagnosis not present

## 2020-07-11 DIAGNOSIS — M5137 Other intervertebral disc degeneration, lumbosacral region: Secondary | ICD-10-CM | POA: Diagnosis not present

## 2020-07-11 DIAGNOSIS — G894 Chronic pain syndrome: Secondary | ICD-10-CM | POA: Insufficient documentation

## 2020-07-11 DIAGNOSIS — M5410 Radiculopathy, site unspecified: Secondary | ICD-10-CM | POA: Diagnosis not present

## 2020-07-11 DIAGNOSIS — M79604 Pain in right leg: Secondary | ICD-10-CM | POA: Insufficient documentation

## 2020-07-11 DIAGNOSIS — Z7901 Long term (current) use of anticoagulants: Secondary | ICD-10-CM

## 2020-07-11 MED ORDER — MIDAZOLAM HCL 5 MG/5ML IJ SOLN
1.0000 mg | INTRAMUSCULAR | Status: DC | PRN
  Administered 2020-07-11: 1 mg via INTRAVENOUS
  Filled 2020-07-11: qty 5

## 2020-07-11 MED ORDER — IOHEXOL 180 MG/ML  SOLN
10.0000 mL | Freq: Once | INTRAMUSCULAR | Status: AC
  Administered 2020-07-11: 10 mL via EPIDURAL
  Filled 2020-07-11: qty 20

## 2020-07-11 MED ORDER — LACTATED RINGERS IV SOLN
1000.0000 mL | Freq: Once | INTRAVENOUS | Status: AC
  Administered 2020-07-11: 1000 mL via INTRAVENOUS

## 2020-07-11 MED ORDER — ROPIVACAINE HCL 2 MG/ML IJ SOLN
1.0000 mL | Freq: Once | INTRAMUSCULAR | Status: DC
  Filled 2020-07-11: qty 10

## 2020-07-11 MED ORDER — FENTANYL CITRATE (PF) 100 MCG/2ML IJ SOLN
25.0000 ug | INTRAMUSCULAR | Status: DC | PRN
  Administered 2020-07-11: 50 ug via INTRAVENOUS
  Filled 2020-07-11: qty 2

## 2020-07-11 MED ORDER — DEXAMETHASONE SODIUM PHOSPHATE 10 MG/ML IJ SOLN
10.0000 mg | Freq: Once | INTRAMUSCULAR | Status: AC
  Administered 2020-07-11: 10 mg
  Filled 2020-07-11: qty 1

## 2020-07-11 MED ORDER — TRIAMCINOLONE ACETONIDE 40 MG/ML IJ SUSP
40.0000 mg | Freq: Once | INTRAMUSCULAR | Status: AC
  Administered 2020-07-11: 40 mg
  Filled 2020-07-11: qty 1

## 2020-07-11 MED ORDER — GABAPENTIN 300 MG PO CAPS: 300.0000 mg | ORAL_CAPSULE | Freq: Three times a day (TID) | ORAL | 0 refills | Status: DC

## 2020-07-11 MED ORDER — ROPIVACAINE HCL 2 MG/ML IJ SOLN
2.0000 mL | Freq: Once | INTRAMUSCULAR | Status: DC
  Filled 2020-07-11: qty 10

## 2020-07-11 MED ORDER — SODIUM CHLORIDE 0.9% FLUSH
1.0000 mL | Freq: Once | INTRAVENOUS | Status: AC
  Administered 2020-07-11: 2 mL

## 2020-07-11 MED ORDER — SODIUM CHLORIDE 0.9% FLUSH
2.0000 mL | Freq: Once | INTRAVENOUS | Status: AC
  Administered 2020-07-11: 2 mL

## 2020-07-11 MED ORDER — LIDOCAINE HCL 2 % IJ SOLN
20.0000 mL | Freq: Once | INTRAMUSCULAR | Status: AC
  Administered 2020-07-11: 400 mg
  Filled 2020-07-11: qty 20

## 2020-07-11 MED ORDER — OXYCODONE HCL 5 MG PO TABS: 5.0000 mg | ORAL_TABLET | Freq: Four times a day (QID) | ORAL | 0 refills | Status: DC | PRN

## 2020-07-11 NOTE — Progress Notes (Signed)
PROVIDER NOTE: Information contained herein reflects review and annotations entered in association with encounter. Interpretation of such information and data should be left to medically-trained personnel. Information provided to patient can be located elsewhere in the medical record under "Patient Instructions". Document created using STT-dictation technology, any transcriptional errors that may result from process are unintentional.    Patient: Jamie Phillips  Service Category: Procedure  Provider: Gaspar Cola, MD  DOB: 08/27/1940  DOS: 07/11/2020  Location: [facility]  MRN: [id number]  Setting: Ambulatory - outpatient  Referring Provider: Kirk Ruths, MD  Type: Established Patient  Specialty: Interventional Pain Management  PCP: Kirk Ruths, MD   Primary Reason for Visit: Interventional Pain Management Treatment. CC: Back Pain (lower)  Procedure #1:  Anesthesia, Analgesia, Anxiolysis:  Type: Therapeutic Trans-Foraminal Epidural Steroid Injection   #1  Region: Lumbar Level: L3 Paravertebral Laterality: Bilateral Paravertebral   Type: Moderate (Conscious) Sedation combined with Local Anesthesia Indication(s): Analgesia and Anxiety Route: Intravenous (IV) IV Access: Secured Sedation: Meaningful verbal contact was maintained at all times during the procedure  Local Anesthetic: Lidocaine 1-2%  Position: Prone  Procedure #2:    Type: Therapeutic Inter-Laminar Epidural Steroid Injection   #1  Region: Lumbar Level: L2-3 Level. Laterality: Right Paramedial     Indications: 1. DDD (degenerative disc disease), lumbosacral   2. 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 Retrolisthesis (L1/L2)   4. 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 Chronic lower extremity pain (1ry area of Pain) (Bilateral)   7. Annular tear of lumbar disc (L3-4)  (Left)   8. 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 (year old), female patient, seen today for interventional treatment. She  has a past surgical history that includes neck fusion; Wrist surgery (Left, 2000); Cardiac catheterization (N/A, 08/15/2015); IVC FILTER INSERTION (N/A, 06/11/2016); Colonoscopy; Joint replacement (Left, 06/17/2016); Colonoscopy with propofol (N/A, 03/10/2017); IVC FILTER REMOVAL (N/A, 05/06/2017); laparoscopy (N/A, 10/10/2018); laparoscopic appendectomy (N/A, 10/10/2018); Partial colectomy (Right, 10/10/2018); Appendectomy; Esophagogastroduodenoscopy (egd) with propofol (N/A, 02/02/2019); and Breast biopsy (Left, 08/19/04). Jamie Phillips has a current medication list which includes the following prescription(s): acetaminophen, albuterol, cholestyramine, duloxetine, eliquis, escitalopram, losartan-hydrochlorothiazide, multiple vitamins-minerals, pravastatin, sucralfate, gabapentin, [START ON 07/21/2020] oxycodone, and pantoprazole, and the following Facility-Administered Medications: fentanyl, midazolam, ropivacaine (pf) 2 mg/ml (0.2%), and ropivacaine (pf) 2 mg/ml (0.2%). Her primarily concern today is the Back Pain (lower)  Initial Vital Signs:  Pulse/HCG Rate: 67ECG Heart Rate: 64 Temp: (!) 96.6 F (35.9 C) Resp: 16 BP: (!) 110/93 SpO2: 96 %  BMI: Estimated body mass index is 44.76 kg/m as calculated from the following:   Height as of this encounter: 5\' 5"  (1.651 m).   Weight as of this encounter: 269 lb (122 kg).  Risk Assessment: Allergies: Reviewed. She has No Known Allergies.  Allergy Precautions: None required Coagulopathies: Reviewed. None identified.  Blood-thinner therapy: None at this time Active Infection(s): Reviewed.  None identified. Jamie Phillips is afebrile  Site Confirmation: Jamie Phillips was asked to confirm the procedure and laterality before marking the site Procedure checklist: Completed Consent: Before the procedure and under the influence of no sedative(s), amnesic(s), or anxiolytics, the patient was  informed of the treatment options, risks and possible complications. To fulfill our ethical and legal obligations, as recommended by the American Medical Association's Code of Ethics, I have informed the patient of my clinical impression; the nature and purpose of the treatment or procedure; the risks, benefits, and possible complications of the intervention; the alternatives, including doing nothing; the risk(s) and benefit(s) of the alternative treatment(s) or procedure(s); and the risk(s) and benefit(s) of doing nothing. The patient was provided information about the general risks and possible complications associated with the procedure. These may include, but are not limited to: failure to achieve desired goals, infection, bleeding, organ or nerve damage, allergic reactions, paralysis, and death. In addition, the patient was informed of those risks and complications associated to Spine-related procedures, such as failure to decrease pain; infection (i.e.: Meningitis, epidural or intraspinal abscess); bleeding (i.e.: epidural hematoma, subarachnoid hemorrhage, or any other type of intraspinal or peri-dural bleeding); organ or nerve damage (i.e.: Any type of peripheral nerve, nerve root, or spinal cord injury) with subsequent damage to sensory, motor, and/or autonomic systems, resulting in permanent pain, numbness, and/or weakness of one or several areas of the body; allergic reactions; (i.e.: anaphylactic reaction); and/or death. Furthermore, the patient was informed of those risks and complications associated with the medications. These include, but are not limited to: allergic reactions (i.e.: anaphylactic or  anaphylactoid reaction(s)); adrenal axis suppression; blood sugar elevation that in diabetics may result in ketoacidosis or comma; water retention that in patients with history of congestive heart failure may result in shortness of breath, pulmonary edema, and decompensation with resultant heart failure; weight gain; swelling or edema; medication-induced neural toxicity; particulate matter embolism and blood vessel occlusion with resultant organ, and/or nervous system infarction; and/or aseptic necrosis of one or more joints. Finally, the patient was informed that Medicine is not an exact science; therefore, there is also the possibility of unforeseen or unpredictable risks and/or possible complications that may result in a catastrophic outcome. The patient indicated having understood very clearly. We have given the patient no guarantees and we have made no promises. Enough time was given to the patient to ask questions, all of which were answered to the patient's satisfaction. Ms. Laroche has indicated that she wanted to continue with the procedure. Attestation: I, the ordering provider, attest that I have discussed with the patient the benefits, risks, side-effects, alternatives, likelihood of achieving goals, and potential problems during recovery for the procedure that I have provided informed consent. Date  Time: 07/11/2020  9:11 AM  Pre-Procedure Preparation:  Monitoring: As per clinic protocol. Respiration, ETCO2, SpO2, BP, heart rate and rhythm monitor placed and checked for adequate function Safety Precautions: Patient was assessed for positional comfort and pressure points before starting the procedure. Time-out: I initiated and conducted the "Time-out" before starting the procedure, as per protocol. The patient was asked to participate by confirming the accuracy of the "Time Out" information. Verification of the correct person, site, and procedure were performed and confirmed by me, the nursing  staff, and the patient. "Time-out" conducted as per Joint Commission's Universal Protocol (UP.01.01.01). Time: 1037  Description of Procedure #1:  Target Area: The inferior and lateral portion of the pedicle, just lateral to a line created by the 6:00 position of the pedicle and the superior articular process of the vertebral body below. On the lateral view, this target lies just posterior to the anterior aspect of the lamina and posterior to the midpoint created between the anterior and the posterior aspect  of the neural foramina. Approach: Posterior paravertebral approach. Area Prepped: Entire Posterior Lumbosacral Region DuraPrep (Iodine Povacrylex [0.7% available iodine] and Isopropyl Alcohol, 74% w/w) Safety Precautions: Aspiration looking for blood return was conducted prior to all injections. At no point did we inject any substances, as a needle was being advanced. No attempts were made at seeking any paresthesias. Safe injection practices and needle disposal techniques used. Medications properly checked for expiration dates. SDV (single dose vial) medications used.  Description of the Procedure: Protocol guidelines were followed. The patient was placed in position over the fluoroscopy table. The target area was identified and the area prepped in the usual manner. Skin & deeper tissues infiltrated with local anesthetic. Appropriate amount of time allowed to pass for local anesthetics to take effect. The procedure needles were then advanced to the target area. Proper needle placement secured. Negative aspiration confirmed. Solution injected in intermittent fashion, asking for systemic symptoms every 0.2cc of injectate. The needles were then removed and the area cleansed, making sure to leave some of the prepping solution back to take advantage of its long term bactericidal properties.  Start Time: 1037 hrs.  Materials:  Needle(s) Type: Spinal Needle Gauge: 22G Length: 3.5-in Medication(s):  Please see orders for medications and dosing details.  Description of Procedure #2:  Target Area: The  interlaminar space, initially targeting the lower border of the superior vertebral body lamina. Approach: Posterior paramedial approach. Area Prepped: Same as above Prepping solution: Same as above Safety Precautions: Same as above  Description of the Procedure: Protocol guidelines were followed. The patient was placed in position over the fluoroscopy table. The target area was identified and the area prepped in the usual manner. Skin & deeper tissues infiltrated with local anesthetic. Appropriate amount of time allowed to pass for local anesthetics to take effect. The procedure needle was introduced through the skin, ipsilateral to the reported pain, and advanced to the target area. Bone was contacted and the needle walked caudad, until the lamina was cleared. The ligamentum flavum was engaged and loss-of-resistance technique used as the epidural needle was advanced. The epidural space was identified using "loss-of-resistance technique" with 2-3 ml of PF-NaCl (0.9% NSS), in a 5cc LOR glass syringe. Proper needle placement secured. Negative aspiration confirmed. Solution injected in intermittent fashion, asking for systemic symptoms every 0.5cc of injectate. The needles were then removed and the area cleansed, making sure to leave some of the prepping solution back to take advantage of its long term bactericidal properties.  Vitals:   07/11/20 1045 07/11/20 1055 07/11/20 1105 07/11/20 1115  BP: (!) 125/91 117/73 117/73 118/67  Pulse: 61     Resp: 15 18 16 16   Temp:  (!) 97.3 F (36.3 C)  97.6 F (36.4 C)  TempSrc:  Temporal    SpO2: 97% 94% 94% 95%  Weight:      Height:        End Time: 1048 hrs.  Materials:  Needle(s) Type: Epidural needle Gauge: 17G Length: 3.5-in Medication(s): Please see orders for medications and dosing details.  Imaging Guidance (Spinal):          Type of  Imaging Technique: Fluoroscopy Guidance (Spinal) Indication(s): Assistance in needle guidance and placement for procedures requiring needle placement in or near specific anatomical locations not easily accessible without such assistance. Exposure Time: Please see nurses notes. Contrast: Before injecting any contrast, we confirmed that the patient did not have an allergy to iodine, shellfish, or radiological contrast. Once satisfactory needle placement was  completed at the desired level, radiological contrast was injected. Contrast injected under live fluoroscopy. No contrast complications. See chart for type and volume of contrast used. Fluoroscopic Guidance: I was personally present during the use of fluoroscopy. "Tunnel Vision Technique" used to obtain the best possible view of the target area. Parallax error corrected before commencing the procedure. "Direction-depth-direction" technique used to introduce the needle under continuous pulsed fluoroscopy. Once target was reached, antero-posterior, oblique, and lateral fluoroscopic projection used confirm needle placement in all planes. Images permanently stored in EMR. Interpretation: I personally interpreted the imaging intraoperatively. Adequate needle placement confirmed in multiple planes. Appropriate spread of contrast into desired area was observed. No evidence of afferent or efferent intravascular uptake. No intrathecal or subarachnoid spread observed. Permanent images saved into the patient's record.  Antibiotic Prophylaxis:   Anti-infectives (From admission, onward)   None     Indication(s): None identified  Post-operative Assessment:  Post-procedure Vital Signs:  Pulse/HCG Rate: 61 (nsr)60 Temp: 97.6 F (36.4 C) Resp: 16 BP: 118/67 SpO2: 95 %  EBL: None  Complications: No immediate post-treatment complications observed by team, or reported by patient.  Note: The patient tolerated the entire procedure well. A repeat set of vitals  were taken after the procedure and the patient was kept under observation following institutional policy, for this type of procedure. Post-procedural neurological assessment was performed, showing return to baseline, prior to discharge. The patient was provided with post-procedure discharge instructions, including a section on how to identify potential problems. Should any problems arise concerning this procedure, the patient was given instructions to immediately contact us, at any time, without hesitation. In any case, we plan to contact the patient by telephone for a follow-up status report regarding this interventional procedure.  Comments:  No additional relevant information.  Plan of Care  Orders:  Orders Placed This Encounter  Procedures  . Lumbar Epidural Injection    Scheduling Instructions:     Procedure: Interlaminar LESI L2-3     Laterality: Right-sided     Sedation: Patient's choice     Timeframe:  Today    Order Specific Question:   Where will this procedure be performed?    Answer:   [facility]  . Lumbar Transforaminal Epidural    Scheduling Instructions:     Side: Bilateral     Level: L3     Sedation: Patient's choice.     Timeframe: Today    Order Specific Question:   Where will this procedure be performed?    Answer:   [facility]  . [facility] C-ARM 1-60 MIN NO REPORT    Intraoperative interpretation by procedural physician at [facility].    Standing Status:   Standing    Number of Occurrences:   1    Order Specific Question:   Reason for exam:    Answer:   Assistance in needle guidance and placement for procedures requiring needle placement in or near specific anatomical locations not easily accessible without such assistance.  . Informed Consent Details: Physician/Practitioner Attestation; Transcribe to consent form and obtain patient signature    Note: Always confirm laterality of pain with Ms. Hardin Negus, before  procedure. Transcribe to consent form and obtain patient signature.    Order Specific Question:   Physician/Practitioner attestation of informed consent for procedure/surgical case    Answer:   I, the physician/practitioner, attest that I have discussed with the patient the benefits, risks, side effects, alternatives, likelihood of achieving goals and potential  problems during recovery for the procedure that I have provided informed consent.    Order Specific Question:   Procedure    Answer:   Lumbar epidural steroid injection under fluoroscopic guidance    Order Specific Question:   Physician/Practitioner performing the procedure    Answer:   Francisco A. Dossie Arbour, MD    Order Specific Question:   Indication/Reason    Answer:   Low back and/or lower extremity pain secondary to lumbar radiculitis  . Informed Consent Details: Physician/Practitioner Attestation; Transcribe to consent form and obtain patient signature    Provider Attestation: I, Capitanejo Dossie Arbour, MD, (Pain Management Specialist), the physician/practitioner, attest that I have discussed with the patient the benefits, risks, side effects, alternatives, likelihood of achieving goals and potential problems during recovery for the procedure that I have provided informed consent.    Scheduling Instructions:     Note: Always confirm laterality of pain with Ms. Hardin Negus, before procedure.     Transcribe to consent form and obtain patient signature.    Order Specific Question:   Physician/Practitioner attestation of informed consent for procedure/surgical case    Answer:   I, the physician/practitioner, attest that I have discussed with the patient the benefits, risks, side effects, alternatives, likelihood of achieving goals and potential problems during recovery for the procedure that I have provided informed consent.    Order Specific Question:   Procedure    Answer:   Diagnostic lumbar transforaminal epidural steroid injection under  fluoroscopic guidance. (See notes for level and laterality.)    Order Specific Question:   Physician/Practitioner performing the procedure    Answer:   Francisco A. Dossie Arbour, MD    Order Specific Question:   Indication/Reason    Answer:   Lumbar radiculopathy/radiculitis associated with lumbar stenosis  . Care order/instruction: Please confirm that the patient has stopped the Eliquis (Apixaban) x 3 days prior to procedure or surgery.    Please confirm that the patient has stopped the Eliquis (Apixaban) x 3 days prior to procedure or surgery.    Standing Status:   Standing    Number of Occurrences:   1  . Provide equipment / supplies at bedside    "Epidural Tray" (Disposable  single use) Catheter: NOT required    Standing Status:   Standing    Number of Occurrences:   1    Order Specific Question:   Specify    Answer:   Epidural Tray  . Monitor O2 SATs    Monitor O2 SATs during and after procedure. Administer supplemental O2 if saturation decreases with sedation.    Standing Status:   Standing    Number of Occurrences:   1  . Bleeding precautions    Standing Status:   Standing    Number of Occurrences:   1   Chronic Opioid Analgesic:  Tramadol 50 mg, 1 tab p.o. 4 times daily (200 mg/day of tramadol) MME/day: 20 mg/day   Medications ordered for procedure: Meds ordered this encounter  Medications  . iohexol (OMNIPAQUE) 180 MG/ML injection 10 mL    Must be Myelogram-compatible. If not available, you may substitute with a water-soluble, non-ionic, hypoallergenic, myelogram-compatible radiological contrast medium.  Marland Kitchen lidocaine (XYLOCAINE) 2 % (with pres) injection 400 mg  . lactated ringers infusion 1,000 mL  . midazolam (VERSED) 5 MG/5ML injection 1-2 mg    Make sure Flumazenil is available in the pyxis when using this medication. If oversedation occurs, administer 0.2 mg IV over 15 sec. If after 45  sec no response, administer 0.2 mg again over 1 min; may repeat at 1 min intervals; not  to exceed 4 doses (1 mg)  . fentaNYL (SUBLIMAZE) injection 25-50 mcg    Make sure Narcan is available in the pyxis when using this medication. In the event of respiratory depression (RR< 8/min): Titrate NARCAN (naloxone) in increments of 0.1 to 0.2 mg IV at 2-3 minute intervals, until desired degree of reversal.  . sodium chloride flush (NS) 0.9 % injection 2 mL  . ropivacaine (PF) 2 mg/mL (0.2%) (NAROPIN) injection 2 mL  . triamcinolone acetonide (KENALOG-40) injection 40 mg  . sodium chloride flush (NS) 0.9 % injection 1 mL  . ropivacaine (PF) 2 mg/mL (0.2%) (NAROPIN) injection 1 mL  . dexamethasone (DECADRON) injection 10 mg  . oxyCODONE (OXY IR/ROXICODONE) 5 MG immediate release tablet    Sig: Take 1 tablet (5 mg total) by mouth every 6 (six) hours as needed for severe pain. Must last 30 days.    Dispense:  120 tablet    Refill:  0    Chronic Pain: STOP Act (Not applicable) Fill 1 day early if closed on refill date. Avoid benzodiazepines within 8 hours of opioids  . gabapentin (NEURONTIN) 300 MG capsule    Sig: Take 1 capsule (300 mg total) by mouth 3 (three) times daily.    Dispense:  90 capsule    Refill:  0    Fill one day early if pharmacy is closed on scheduled refill date. Generic permitted. Do not send renewal requests. Void any older duplicate prescription or refill(s) that may be on file.   Medications administered: We administered iohexol, lidocaine, lactated ringers, midazolam, fentaNYL, sodium chloride flush, triamcinolone acetonide, sodium chloride flush, and dexamethasone.  See the medical record for exact dosing, route, and time of administration.  Follow-up plan:   Return in about 2 weeks (around 07/25/2020) for (F2F), (PPE).       Interventional Therapies  Risk  Complexity Considerations:   NOTE: Eliquis Anticoagulation: (Stop: 3 days  Restart: 6 hrs)   Planned  Pending:   Diagnostic bilateral L3 TFESI #1 + diagnostic right L2-3 LESI #1    Under consideration:    Diagnostic midline T12-L1 LESI    Completed:   None at this time   Therapeutic  Palliative (PRN) options:   None established     Recent Visits Date Type Provider Dept  06/21/20 Office Visit Milinda Pointer, MD [facility]  04/24/20 Office Visit Milinda Pointer, MD [facility]  Showing recent visits within past 90 days and meeting all other requirements Today's Visits Date Type Provider Dept  07/11/20 Procedure visit Milinda Pointer, MD [facility]  Showing today's visits and meeting all other requirements Future Appointments Date Type Provider Dept  07/27/20 Appointment Milinda Pointer, MD [facility]  Showing future appointments within next 90 days and meeting all other requirements  Disposition: Discharge home  Discharge (Date  Time): 07/11/2020; 1120 hrs.   Primary Care Physician: Kirk Ruths, MD Location: [facility] Note by: Gaspar Cola, MD Date: 07/11/2020; Time: 2:42 PM  Disclaimer:  Medicine is not an Chief Strategy Officer. The only guarantee in medicine is that nothing is guaranteed. It is important to note that the decision to proceed with this intervention was based on the information collected from the patient. The Data and conclusions were drawn from the patient's questionnaire, the interview, and the physical examination. Because the information was provided in large  part by the patient, it cannot be guaranteed that it has not been purposely or unconsciously manipulated. Every effort has been made to obtain as much relevant data as possible for this evaluation. It is important to note that the conclusions that lead to this procedure are derived in large part from the available data. Always take into account that the treatment will also be dependent on availability of resources and existing treatment guidelines, considered by other Pain Management Practitioners as being common  knowledge and practice, at the time of the intervention. For Medico-Legal purposes, it is also important to point out that variation in procedural techniques and pharmacological choices are the acceptable norm. The indications, contraindications, technique, and results of the above procedure should only be interpreted and judged by a Board-Certified Interventional Pain Specialist with extensive familiarity and expertise in the same exact procedure and technique.

## 2020-07-11 NOTE — Progress Notes (Signed)
Safety precautions to be maintained throughout the outpatient stay will include: orient to surroundings, keep bed in low position, maintain call bell within reach at all times, provide assistance with transfer out of bed and ambulation.  

## 2020-07-11 NOTE — Patient Instructions (Signed)

## 2020-07-12 ENCOUNTER — Telehealth: Payer: Self-pay | Admitting: *Deleted

## 2020-07-12 NOTE — Telephone Encounter (Signed)
Patient called in and said that she is hurting today and wanted to know why and when she would stop hurting.  I told her that when the initial numbing agent wears off that sometimes the pain can return.  At this point we are waiting for the steroid to help decrease any inflammation that may be there and that would happen over the next 14 days or so.  Take pain medication as needed may use heat or ice for comfort. Patient verbalizes u/o information.

## 2020-07-18 DIAGNOSIS — Z0100 Encounter for examination of eyes and vision without abnormal findings: Secondary | ICD-10-CM | POA: Diagnosis not present

## 2020-07-18 DIAGNOSIS — H353130 Nonexudative age-related macular degeneration, bilateral, stage unspecified: Secondary | ICD-10-CM | POA: Diagnosis not present

## 2020-07-25 DIAGNOSIS — H353221 Exudative age-related macular degeneration, left eye, with active choroidal neovascularization: Secondary | ICD-10-CM | POA: Diagnosis not present

## 2020-07-25 DIAGNOSIS — H353114 Nonexudative age-related macular degeneration, right eye, advanced atrophic with subfoveal involvement: Secondary | ICD-10-CM | POA: Diagnosis not present

## 2020-07-26 DIAGNOSIS — Z7901 Long term (current) use of anticoagulants: Secondary | ICD-10-CM | POA: Diagnosis not present

## 2020-07-26 DIAGNOSIS — R0600 Dyspnea, unspecified: Secondary | ICD-10-CM | POA: Diagnosis not present

## 2020-07-26 DIAGNOSIS — E663 Overweight: Secondary | ICD-10-CM | POA: Diagnosis not present

## 2020-07-26 DIAGNOSIS — G4733 Obstructive sleep apnea (adult) (pediatric): Secondary | ICD-10-CM | POA: Diagnosis not present

## 2020-07-26 DIAGNOSIS — Z9989 Dependence on other enabling machines and devices: Secondary | ICD-10-CM | POA: Diagnosis not present

## 2020-07-27 ENCOUNTER — Encounter: Payer: Self-pay | Admitting: Pain Medicine

## 2020-07-27 ENCOUNTER — Other Ambulatory Visit: Payer: Self-pay

## 2020-07-27 ENCOUNTER — Ambulatory Visit: Payer: Medicare HMO | Attending: Pain Medicine | Admitting: Pain Medicine

## 2020-07-27 VITALS — BP 136/105 | HR 62 | Temp 96.6°F | Resp 18 | Ht 65.0 in | Wt 262.0 lb

## 2020-07-27 DIAGNOSIS — M5441 Lumbago with sciatica, right side: Secondary | ICD-10-CM | POA: Diagnosis not present

## 2020-07-27 DIAGNOSIS — G894 Chronic pain syndrome: Secondary | ICD-10-CM | POA: Diagnosis present

## 2020-07-27 DIAGNOSIS — M5417 Radiculopathy, lumbosacral region: Secondary | ICD-10-CM

## 2020-07-27 DIAGNOSIS — M79605 Pain in left leg: Secondary | ICD-10-CM | POA: Insufficient documentation

## 2020-07-27 DIAGNOSIS — M5442 Lumbago with sciatica, left side: Secondary | ICD-10-CM | POA: Insufficient documentation

## 2020-07-27 DIAGNOSIS — E559 Vitamin D deficiency, unspecified: Secondary | ICD-10-CM | POA: Diagnosis present

## 2020-07-27 DIAGNOSIS — Z79899 Other long term (current) drug therapy: Secondary | ICD-10-CM | POA: Diagnosis not present

## 2020-07-27 DIAGNOSIS — M792 Neuralgia and neuritis, unspecified: Secondary | ICD-10-CM

## 2020-07-27 DIAGNOSIS — G8929 Other chronic pain: Secondary | ICD-10-CM

## 2020-07-27 DIAGNOSIS — M7138 Other bursal cyst, other site: Secondary | ICD-10-CM | POA: Diagnosis not present

## 2020-07-27 DIAGNOSIS — M48062 Spinal stenosis, lumbar region with neurogenic claudication: Secondary | ICD-10-CM

## 2020-07-27 DIAGNOSIS — Z79891 Long term (current) use of opiate analgesic: Secondary | ICD-10-CM

## 2020-07-27 DIAGNOSIS — M48061 Spinal stenosis, lumbar region without neurogenic claudication: Secondary | ICD-10-CM | POA: Diagnosis not present

## 2020-07-27 DIAGNOSIS — M5410 Radiculopathy, site unspecified: Secondary | ICD-10-CM | POA: Insufficient documentation

## 2020-07-27 DIAGNOSIS — M79604 Pain in right leg: Secondary | ICD-10-CM | POA: Insufficient documentation

## 2020-07-27 DIAGNOSIS — M4310 Spondylolisthesis, site unspecified: Secondary | ICD-10-CM | POA: Diagnosis not present

## 2020-07-27 DIAGNOSIS — Z7901 Long term (current) use of anticoagulants: Secondary | ICD-10-CM

## 2020-07-27 DIAGNOSIS — M47816 Spondylosis without myelopathy or radiculopathy, lumbar region: Secondary | ICD-10-CM | POA: Diagnosis not present

## 2020-07-27 MED ORDER — ERGOCALCIFEROL 1.25 MG (50000 UT) PO CAPS: 50000.0000 [IU] | ORAL_CAPSULE | ORAL | 0 refills | Status: AC

## 2020-07-27 MED ORDER — GABAPENTIN 300 MG PO CAPS: 300.0000 mg | ORAL_CAPSULE | Freq: Three times a day (TID) | ORAL | 2 refills | Status: DC

## 2020-07-27 MED ORDER — OXYCODONE HCL 5 MG PO TABS: 5.0000 mg | ORAL_TABLET | Freq: Three times a day (TID) | ORAL | 0 refills | Status: DC | PRN

## 2020-07-27 NOTE — Progress Notes (Signed)
PROVIDER NOTE: Information contained herein reflects review and annotations entered in association with encounter. Interpretation of such information and data should be left to medically-trained personnel. Information provided to patient can be located elsewhere in the medical record under "Patient Instructions". Document created using STT-dictation technology, any transcriptional errors that may result from process are unintentional.    Patient: Jamie Phillips  Service Category: E/M  Provider: Gaspar Cola, MD  DOB: 30-Dec-1940  DOS: 07/27/2020  Specialty: Interventional Pain Management  MRN: [id number]  Setting: Ambulatory outpatient  PCP: Kirk Ruths, MD  Type: Established Patient    Referring Provider: Kirk Ruths, MD  Location: Office  Delivery: Face-to-face     HPI  Ms. Jamie Phillips, a 80 y.o. year old female, is here today because of her Chronic pain of lower extremity, bilateral [M79.604, M79.605, G89.29]. [name] primary complain today is Back Pain (Low, bilateral) Last encounter: My last encounter with her was on 07/11/2020. Pertinent problems: Ms. 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); Abnormal MRI, lumbar spine (01/24/2020); Lumbar facet syndrome (Multilevel) (Bilateral); Lumbar facet hypertrophy (Multilevel) (Bilateral); Severe muscle deconditioning; Lower  extremity weakness (Bilateral); Lumbosacral facet hypertrophy (Multilevel) (Bilateral); Lumbar scoliosis (idiopathic); Spondylolisthesis of lumbosacral region (Multilevel); Retrolisthesis (L1/L2); Anterolisthesis of lumbar spine (L3/L4, L4/L5); Anterolisthesis of lumbosacral spine (L5/S1); Lumbar lateral recess stenosis (Bilateral: L3-4, L4-5) (Severe); Lumbar foraminal stenosis (Right: L4-5); Synovial cyst of lumbar facet joint (L3-4) (Right); Arthropathy of spinal facet joint concurrent with and due to effusion (L3-4); Annular tear of lumbar disc (L3-4) (Left); Chronic lower extremity radicular pain (L1/L2 dermatomes) (Bilateral); Chronic groin pain (Bilateral); Neurogenic pain, lower extremity (Bilateral); Shooting pain, intermittent (electrical-like); and History of total knee replacement (Bilateral)  on their pertinent problem list. Pain Assessment: Severity of Chronic pain is reported as a 6 /10. Location: Back Lower,Right,Left/radiates down back of both legs to mid thigh. Onset: More than a month ago. Quality: Sharp,Tingling. Timing: Constant. Modifying factor(s): sitting, rest. Vitals:  height is _0  (1.651 m) and weight is 262 lb (118.8 kg). Her temperature is 96.6 F (35.9 C) (abnormal). Her blood pressure is 136/105 (abnormal) and her pulse is 62. Her respiration is 18 and oxygen saturation is 95%.   Reason for encounter: both, medication management and post-procedure assessment.   The patient indicates doing well with the current medication regimen. No adverse reactions or side effects reported to the medications.  The patient indicates that she is taking her gabapentin (Neurontin) 300 mg 3 times daily, but has noticed that it makes her a little sleepy.  For this reason we have decided to stay on this particular dose a little longer until her body gets used to it.  Once she gets to the point where she no longer feels sleepy with the use of this medication, then we can consider increasing it  further.  As to the oxycodone, she indicates that it does not provide her with as much relief as this involved time for this reason, she has been taking it differently.  The initial prescription was for 1 tablet p.o. 4 times daily and she is apparently taking 1 tablet p.o. twice daily prn.  She still has approximately 40 of the 120 tablets from the prescription written on 06/21/2020.  That prescription was picked up that same day and it has been 36-day since then.  This means that if she has 40 tablets left she only used 80.  80/36 gives is an average of 2.2 pills/day.  This in turn, if we were to write a prescription for somebody with this particular use, he would be a prescription for only 65 pills/month.  Since the patient is using an average of 2.2 pills/day and she has 40 pills left this means that what she currently has should last for at least another 18 days or until 08/14/2020.  In addition to this, the patient had an additional unused prescription that was written for her on 07/11/2020 which I have called the pharmacy and canceled.  Instead, I sent a new prescription for oxycodone IR 5 mg to be used 2-3 times a day PRN with a total of 65 pills that must last for a month.  The prescription was also written to be filled on 08/14/2020 when I have calculated that she should be running out of her current supply.  Regarding her recent interventional treatment on 07/11/2020, the patient indicates that they bilateral L3 TFESI #1 and the right sided L2-3 LESI #1 completely eliminated the really bad pain that she was experiencing over both of her groin areas and anterior thigh areas (bilateral L1/L2 radiculopathy/radiculitis.  This pain is 100% gone.  However, she continues to have the low back pain as well as pain over the L5 dermatomal distribution on the left side and over the S1 dermatomal distribution on the right side today I have asked the patient which 1 of those is her current worst pain and she has indicated  that by far it is that of the lower back.  Review of the patient's 01/24/2020 MRI shows an L3-4 5 mm anterolisthesis as well as an L4-5 6 mm anterolisthesis with severe facet degeneration with the right being worse than the left.  In addition, she also has severe left and moderate right facet hypertrophy at the L5-S1 level with severe bilateral facet degeneration associated with reactive joint effusions and a 4 mm synovial cyst in the anterior aspect of the right L3-4 facet joint.  In addition to the above there is also moderate facet hypertrophy affecting the T12-L1, L1-2, and L2-3 levels.  For this reason, I have decided to schedule the patient to return for a diagnostic bilateral lumbar facet block under fluoroscopic guidance and IV sedation to determine if this is the etiology of her low back pain.  The plan was shared with the patient who understood and accepted.  RTCB: 08/20/2020  Post-Procedure Evaluation  Procedure (07/11/2020): Diagnostic/therapeutic bilateral L3 TFESI #1 + right L2-3 LESI #1 under fluoroscopic guidance and IV sedation Pre-procedure pain level: 10/10 Post-procedure: 0/10 (100% relief)  Sedation: Sedation provided.  Effectiveness during initial hour after procedure(Ultra-Short Term Relief): 100 % relief of the pain in the anterior aspect of both legs.  Local anesthetic used: Long-acting (4-6 hours) Effectiveness: Defined as any analgesic benefit obtained secondary to the administration of local anesthetics. This carries significant diagnostic value as to the etiological location, or anatomical origin, of the pain. Duration of benefit is expected to coincide with the duration of the local anesthetic used.  Effectiveness during initial 4-6 hours  after procedure(Short-Term Relief): 100 %.  Long-term benefit: Defined as any relief past the pharmacologic duration of the local anesthetics.  Effectiveness past the initial 6 hours after procedure(Long-Term Relief):  100% relief of the  pain in the front of legs. This area does not hurt anymore.  Current benefits: Defined as benefit that persist at this time.   Analgesia:  100% ongoing relief in the pain from the L1/L2 bilateral radiculitis.  It did not improve the low back pain or the pain over the left L5 or right S1 dermatomal regions. Function: Somewhat improved ROM: Somewhat improved  Pharmacotherapy Assessment   Analgesic: Tramadol 50 mg, 1 tab p.o. 4 times daily (200 mg/day of tramadol) MME/day: 20 mg/day   Monitoring: Industry PMP: PDMP reviewed during this encounter.       Pharmacotherapy: No side-effects or adverse reactions reported. Compliance: No problems identified. Effectiveness: Clinically acceptable.  Dewayne Shorter, RN  07/27/2020 12:56 PM  Addendum Safety precautions to be maintained throughout the outpatient stay will include: orient to surroundings, keep bed in low position, maintain call bell within reach at all times, provide assistance with transfer out of bed and ambulation.  Dewayne Shorter, RN  07/27/2020 12:58 PM  Signed Nursing Pain Medication Assessment:  Safety precautions to be maintained throughout the outpatient stay will include: orient to surroundings, keep bed in low position, maintain call bell within reach at all times, provide assistance with transfer out of bed and ambulation.  Medication Inspection Compliance: Pill count conducted under aseptic conditions, in front of the patient. Neither the pills nor the bottle was removed from the patient's sight at any time. Once count was completed pills were immediately returned to the patient in their original bottle.  Medication: Oxycodone IR Pill/Patch Count: 40 of 120 pills remain Pill/Patch Appearance: Markings consistent with prescribed medication Bottle Appearance: Standard pharmacy container. Clearly labeled. Filled Date: 03 / 02 / 2022 Last Medication intake:  Today    UDS:  Summary  Date Value Ref Range Status  04/25/2020 Note  Final     Comment:    ==================================================================== Compliance Drug Analysis, Ur ==================================================================== Test                             Result       Flag       Units  Drug Present and Declared for Prescription Verification   Gabapentin                     PRESENT      EXPECTED   Citalopram                     PRESENT      EXPECTED   Desmethylcitalopram            PRESENT      EXPECTED    Desmethylcitalopram is an expected metabolite of citalopram or the    enantiomeric form, escitalopram.    Duloxetine                     PRESENT      EXPECTED   Acetaminophen                  PRESENT      EXPECTED  Drug Present not Declared for Prescription Verification   Tramadol  2732         UNEXPECTED ng/mg creat   O-Desmethyltramadol            643          UNEXPECTED ng/mg creat   N-Desmethyltramadol            771          UNEXPECTED ng/mg creat    Source of tramadol is a prescription medication. O-desmethyltramadol    and N-desmethyltramadol are expected metabolites of tramadol.    Cyclobenzaprine                PRESENT      UNEXPECTED   Desmethylcyclobenzaprine       PRESENT      UNEXPECTED    Desmethylcyclobenzaprine is an expected metabolite of    cyclobenzaprine.    Propranolol                    PRESENT      UNEXPECTED ==================================================================== Test                      Result    Flag   Units      Ref Range   Creatinine              180              mg/dL      >=20 ==================================================================== Declared Medications:  The flagging and interpretation on this report are based on the  following declared medications.  Unexpected results may arise from  inaccuracies in the declared medications.   **Note: The testing scope of this panel includes these medications:   Duloxetine (Cymbalta)  Escitalopram  (Lexapro)  Gabapentin (Neurontin)   **Note: The testing scope of this panel does not include small to  moderate amounts of these reported medications:   Acetaminophen (Tylenol)   **Note: The testing scope of this panel does not include the  following reported medications:   Albuterol (Ventolin HFA)  Apixaban (Eliquis)  Cholestyramine (Questran)  Hydrochlorothiazide (Hyzaar)  Losartan (Hyzaar)  Pantoprazole (Protonix)  Pravastatin (Pravachol)  Sucralfate (Carafate) ==================================================================== For clinical consultation, please call [phone]. ====================================================================      ROS  Constitutional: Denies any fever or chills Gastrointestinal: No reported hemesis, hematochezia, vomiting, or acute GI distress Musculoskeletal: Denies any acute onset joint swelling, redness, loss of ROM, or weakness Neurological: No reported episodes of acute onset apraxia, aphasia, dysarthria, agnosia, amnesia, paralysis, loss of coordination, or loss of consciousness  Medication Review  DULoxetine, Multiple Vitamins-Minerals, acetaminophen, albuterol, apixaban, cholestyramine, ergocalciferol, escitalopram, gabapentin, losartan-hydrochlorothiazide, oxyCODONE, pantoprazole, pravastatin, and sucralfate  History Review  Allergy: Ms. Saulter has No Known Allergies. Drug: Ms. Gabor  reports no history of drug use. Alcohol:  reports no history of alcohol use. Tobacco:  reports that she has never smoked. She has never used smokeless tobacco. Social: Ms. Zaucha  reports that she has never smoked. She has never used smokeless tobacco. She reports that she does not drink alcohol and does not use drugs. 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 Surgical: Ms. Biggs  has a past surgical history that includes neck fusion; Wrist surgery (Left, 2000); Cardiac catheterization (N/A, 08/15/2015); IVC FILTER INSERTION (N/A, 06/11/2016); Colonoscopy; Joint replacement (Left, 06/17/2016); Colonoscopy with propofol (N/A, 03/10/2017); IVC FILTER REMOVAL (N/A, 05/06/2017); laparoscopy (N/A, 10/10/2018); laparoscopic appendectomy (N/A, 10/10/2018); Partial colectomy (Right, 10/10/2018); Appendectomy; Esophagogastroduodenoscopy (egd) with propofol (N/A, 02/02/2019); and Breast biopsy (Left, 08/19/04). Family: family history includes Congestive Heart Failure in her father; Heart failure in her father; Pulmonary embolism in her mother.  Laboratory Chemistry Profile   Renal Lab Results  Component Value Date   BUN 16 06/21/2020   CREATININE 0.93 06/21/2020   BCR 17 06/21/2020   GFRAA 55 (L) 09/03/2019   GFRNONAA >60 06/16/2020     Hepatic Lab Results  Component Value Date   AST 16 06/21/2020   ALT 15 03/06/2020   ALBUMIN 4.2 06/21/2020   ALKPHOS 104 06/21/2020   LIPASE 37 10/09/2018     Electrolytes Lab Results  Component Value Date   NA 144 06/21/2020   K 4.5 06/21/2020   CL 103 06/21/2020   CALCIUM 9.5 06/21/2020   MG 1.9 06/21/2020     Bone Lab Results  Component Value Date   25OHVITD1 14 (L) 06/21/2020   25OHVITD2 <1.0 06/21/2020   25OHVITD3 14 06/21/2020     Inflammation (CRP: Acute Phase) (ESR: Chronic Phase) Lab Results  Component Value Date   CRP 9 06/21/2020   ESRSEDRATE 21 06/21/2020   LATICACIDVEN 0.9 12/13/2018       Note: Above Lab results reviewed.  Recent  Imaging Review  DG PAIN CLINIC C-ARM 1-60 MIN NO REPORT Fluoro was used, but no Radiologist interpretation will be provided.  Please refer to "NOTES" tab for provider progress note. Note: Reviewed        Physical Exam  General appearance: Well nourished, well developed, and well hydrated. In no apparent acute distress Mental status: Alert, oriented x 3 (person, place, & time)       Respiratory: No evidence of acute respiratory distress Eyes: PERLA Vitals: BP (!) 136/105 Comment: patient states she takes blood pressure medication.  Asympto  Pulse 62   Temp (!) 96.6 F (35.9 C)   Resp 18   Ht _0  (1.651 m)   Wt 262 lb (118.8 kg)   SpO2 95%   BMI 43.60 kg/m  BMI: Estimated body mass index is 43.6 kg/m as calculated from the following:   Height as of this encounter: _1  (1.651 m).   Weight as of this encounter: 262 lb (118.8 kg). Ideal: Ideal body weight: 57 kg (125 lb 10.6 oz) Adjusted ideal body weight: 81.7 kg (180 lb 3.2 oz)  Assessment   Status Diagnosis  Improved Resolved Improving 1. Chronic lower extremity pain (1ry area of Pain) (Bilateral)   2. 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 Chronic low back pain (2ry area of Pain) (Bilateral) w/ sciatica (Bilateral)   4. 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 Lumbar facet hypertrophy (Multilevel) (Bilateral)   6. Lumbar lateral recess stenosis (Bilateral: L3-4, L4-5) (Severe)   7. Lumbar foraminal stenosis (Right: L4-5)   8. 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 Lumbosacral radiculopathy at L5 (Left)   10. Lumbosacral radiculopathy at S1 (Right)   11. Retrolisthesis (L1/L2)   12. Synovial cyst of lumbar facet joint (L3-4) (Right)   13. Neurogenic pain, leg, unspecified laterality   14. Chronic pain syndrome   15. Pharmacologic therapy   16.  Chronic use of opiate for therapeutic purpose   17. Chronic anticoagulation (Eliquis)   18.  Lumbar facet syndrome (Multilevel) (Bilateral)   19. Vitamin D deficiency      Updated Problems: Problem  Chronic Use of Opiate for Therapeutic Purpose  Vitamin D Deficiency    Plan of Care  Problem-specific:  No problem-specific Assessment & Plan notes found for this encounter.  Ms. GENEVER HENTGES has a current medication list which includes the following long-term medication(s): albuterol, cholestyramine, duloxetine, eliquis, escitalopram, [START ON 08/10/2020] gabapentin, losartan-hydrochlorothiazide, [START ON 08/14/2020] oxycodone, and pantoprazole.  Pharmacotherapy (Medications Ordered): Meds ordered this encounter  Medications  . gabapentin (NEURONTIN) 300 MG capsule    Sig: Take 1 capsule (300 mg total) by mouth 3 (three) times daily.    Dispense:  90 capsule    Refill:  2    Fill one day early if pharmacy is closed on scheduled refill date. Generic permitted. Do not send renewal requests. Void any older duplicate prescription or refill(s) that may be on file.  Marland Kitchen oxyCODONE (OXY IR/ROXICODONE) 5 MG immediate release tablet    Sig: Take 1 tablet (5 mg total) by mouth 3 (three) times daily as needed for severe pain. Must last 30 days.    Dispense:  65 tablet    Refill:  0    Chronic Pain: STOP Act (Not applicable) Fill 1 day early if closed on refill date. Avoid benzodiazepines within 8 hours of opioids  . ergocalciferol (VITAMIN D2) 1.25 MG (50000 UT) capsule    Sig: Take 1 capsule (50,000 Units total) by mouth 2 (two) times a week. X 6 weeks.    Dispense:  12 capsule    Refill:  0    Fill one day early if pharmacy is closed on scheduled refill date. May substitute for generic, or similar, if available.   Orders:  Orders Placed This Encounter  Procedures  . LUMBAR FACET(MEDIAL BRANCH NERVE BLOCK) MBNB    Standing Status:   Future    Standing Expiration Date:   08/26/2020    Scheduling Instructions:     Procedure: Lumbar facet block (AKA.: Lumbosacral medial branch nerve  block)     Side: Bilateral     Level: L3-4, L4-5, & L5-S1 Facets (L2, L3, L4, L5, & S1 Medial Branch Nerves)     Sedation: Patient's choice.     Timeframe: ASAA    Order Specific Question:   Where will this procedure be performed?    Answer:   [facility]  . ToxASSURE Select 13 (MW), Urine    Volume: 30 ml(s). Minimum 3 ml of urine is needed. Document temperature of fresh sample. Indications: Long term (current) use of opiate analgesic (D40.814)    Order Specific Question:   Release to patient    Answer:   Immediate  . Blood Thinner Instructions to Nursing    Always make sure patient has clearance from prescribing physician to stop blood thinners for interventional therapies. If the patient requires a Lovenox-bridge therapy, make sure arrangements are made to institute it with the assistance of the PCP.    Scheduling Instructions:     Have Ms. Crume stop the Eliquis (Apixaban) x 3 days prior to procedure or surgery.   Follow-up plan:   Return for Procedure (w/ sedation): (B) L-FCT BLK #1.      Interventional Therapies  Risk  Complexity Considerations:   NOTE: Eliquis Anticoagulation: (Stop: 3 days  Restart: 6 hrs)   Planned  Pending:   Diagnostic bilateral L3 TFESI #1 + diagnostic right L2-3 LESI #1    Under consideration:   Diagnostic midline T12-L1 LESI    Completed:   None at this time   Therapeutic  Palliative (PRN) options:   None established      Recent Visits Date Type Provider Dept  07/11/20 Procedure visit Milinda Pointer, MD [facility]  06/21/20 Office Visit Milinda Pointer, MD [facility]  Showing recent visits within past 90 days and meeting all other requirements Today's Visits Date Type Provider Dept  07/27/20 Office Visit Milinda Pointer, MD [facility]  Showing today's visits and meeting all other requirements Future Appointments No visits were found meeting these conditions. Showing future  appointments within next 90 days and meeting all other requirements  I discussed the assessment and treatment plan with the patient. The patient was provided an opportunity to ask questions and all were answered. The patient agreed with the plan and demonstrated an understanding of the instructions.  Patient advised to call back or seek an in-person evaluation if the symptoms or condition worsens.  Duration of encounter: 35 minutes.  Note by: Gaspar Cola, MD Date: 07/27/2020; Time: 5:19 PM

## 2020-07-27 NOTE — Patient Instructions (Addendum)
____________________________________________________________________________________________  Preparing for Procedure with Sedation  Procedure appointments are limited to planned procedures: . No Prescription Refills. . No disability issues will be discussed. . No medication changes will be discussed.  Instructions: . Oral Intake: Do not eat or drink anything for at least 8 hours prior to your procedure. (Exception: Blood Pressure Medication. See below.) . Transportation: Unless otherwise stated by your physician, you may drive yourself after the procedure. . Blood Pressure Medicine: Do not forget to take your blood pressure medicine with a sip of water the morning of the procedure. If your Diastolic (lower reading)is above 100 mmHg, elective cases will be cancelled/rescheduled. . Blood thinners: These will need to be stopped for procedures. Notify our staff if you are taking any blood thinners. Depending on which one you take, there will be specific instructions on how and when to stop it. . Diabetics on insulin: Notify the staff so that you can be scheduled 1st case in the morning. If your diabetes requires high dose insulin, take only  of your normal insulin dose the morning of the procedure and notify the staff that you have done so. . Preventing infections: Shower with an antibacterial soap the morning of your procedure. . Build-up your immune system: Take 1000 mg of Vitamin C with every meal (3 times a day) the day prior to your procedure. . Antibiotics: Inform the staff if you have a condition or reason that requires you to take antibiotics before dental procedures. . Pregnancy: If you are pregnant, call and cancel the procedure. . Sickness: If you have a cold, fever, or any active infections, call and cancel the procedure. . Arrival: You must be in the facility at least 30 minutes prior to your scheduled procedure. . Children: Do not bring children with you. . Dress appropriately:  Bring dark clothing that you would not mind if they get stained. . Valuables: Do not bring any jewelry or valuables.  Reasons to call and reschedule or cancel your procedure: (Following these recommendations will minimize the risk of a serious complication.) . Surgeries: Avoid having procedures within 2 weeks of any surgery. (Avoid for 2 weeks before or after any surgery). . Flu Shots: Avoid having procedures within 2 weeks of a flu shots or . (Avoid for 2 weeks before or after immunizations). . Barium: Avoid having a procedure within 7-10 days after having had a radiological study involving the use of radiological contrast. (Myelograms, Barium swallow or enema study). . Heart attacks: Avoid any elective procedures or surgeries for the initial 6 months after a "Myocardial Infarction" (Heart Attack). . Blood thinners: It is imperative that you stop these medications before procedures. Let us know if you if you take any blood thinner.  . Infection: Avoid procedures during or within two weeks of an infection (including chest colds or gastrointestinal problems). Symptoms associated with infections include: Localized redness, fever, chills, night sweats or profuse sweating, burning sensation when voiding, cough, congestion, stuffiness, runny nose, sore throat, diarrhea, nausea, vomiting, cold or Flu symptoms, recent or current infections. It is specially important if the infection is over the area that we intend to treat. . Heart and lung problems: Symptoms that may suggest an active cardiopulmonary problem include: cough, chest pain, breathing difficulties or shortness of breath, dizziness, ankle swelling, uncontrolled high or unusually low blood pressure, and/or palpitations. If you are experiencing any of these symptoms, cancel your procedure and contact your primary care physician for an evaluation.  Remember:  Regular Business hours are:    Monday to Thursday 8:00 AM to 4:00 PM  Provider's  Schedule: Alissia Lory, MD:  Procedure days: Tuesday and Thursday 7:30 AM to 4:00 PM  Bilal Lateef, MD:  Procedure days: Monday and Wednesday 7:30 AM to 4:00 PM ____________________________________________________________________________________________   ____________________________________________________________________________________________  General Risks and Possible Complications  Patient Responsibilities: It is important that you read this as it is part of your informed consent. It is our duty to inform you of the risks and possible complications associated with treatments offered to you. It is your responsibility as a patient to read this and to ask questions about anything that is not clear or that you believe was not covered in this document.  Patient's Rights: You have the right to refuse treatment. You also have the right to change your mind, even after initially having agreed to have the treatment done. However, under this last option, if you wait until the last second to change your mind, you may be charged for the materials used up to that point.  Introduction: Medicine is not an exact science. Everything in Medicine, including the lack of treatment(s), carries the potential for danger, harm, or loss (which is by definition: Risk). In Medicine, a complication is a secondary problem, condition, or disease that can aggravate an already existing one. All treatments carry the risk of possible complications. The fact that a side effects or complications occurs, does not imply that the treatment was conducted incorrectly. It must be clearly understood that these can happen even when everything is done following the highest safety standards.  No treatment: You can choose not to proceed with the proposed treatment alternative. The "PRO(s)" would include: avoiding the risk of complications associated with the therapy. The "CON(s)" would include: not getting any of the treatment  benefits. These benefits fall under one of three categories: diagnostic; therapeutic; and/or palliative. Diagnostic benefits include: getting information which can ultimately lead to improvement of the disease or symptom(s). Therapeutic benefits are those associated with the successful treatment of the disease. Finally, palliative benefits are those related to the decrease of the primary symptoms, without necessarily curing the condition (example: decreasing the pain from a flare-up of a chronic condition, such as incurable terminal cancer).  General Risks and Complications: These are associated to most interventional treatments. They can occur alone, or in combination. They fall under one of the following six (6) categories: no benefit or worsening of symptoms; bleeding; infection; nerve damage; allergic reactions; and/or death. 1. No benefits or worsening of symptoms: In Medicine there are no guarantees, only probabilities. No healthcare provider can ever guarantee that a medical treatment will work, they can only state the probability that it may. Furthermore, there is always the possibility that the condition may worsen, either directly, or indirectly, as a consequence of the treatment. 2. Bleeding: This is more common if the patient is taking a blood thinner, either prescription or over the counter (example: Goody Powders, Fish oil, Aspirin, Garlic, etc.), or if suffering a condition associated with impaired coagulation (example: Hemophilia, cirrhosis of the liver, low platelet counts, etc.). However, even if you do not have one on these, it can still happen. If you have any of these conditions, or take one of these drugs, make sure to notify your treating physician. 3. Infection: This is more common in patients with a compromised immune system, either due to disease (example: diabetes, cancer, human immunodeficiency virus [HIV], etc.), or due to medications or treatments (example: therapies used to treat  cancer and   rheumatological diseases). However, even if you do not have one on these, it can still happen. If you have any of these conditions, or take one of these drugs, make sure to notify your treating physician. 4. Nerve Damage: This is more common when the treatment is an invasive one, but it can also happen with the use of medications, such as those used in the treatment of cancer. The damage can occur to small secondary nerves, or to large primary ones, such as those in the spinal cord and brain. This damage may be temporary or permanent and it may lead to impairments that can range from temporary numbness to permanent paralysis and/or brain death. 5. Allergic Reactions: Any time a substance or material comes in contact with our body, there is the possibility of an allergic reaction. These can range from a mild skin rash (contact dermatitis) to a severe systemic reaction (anaphylactic reaction), which can result in death. 6. Death: In general, any medical intervention can result in death, most of the time due to an unforeseen complication. ____________________________________________________________________________________________  ____________________________________________________________________________________________  Blood Thinners  IMPORTANT NOTICE:  If you take any of these, make sure to notify the nursing staff.  Failure to do so may result in injury.  Recommended time intervals to stop and restart blood-thinners, before & after invasive procedures  Generic Name Brand Name Stop Time. Must be stopped at least this long before procedures. After procedures, wait at least this long before re-starting.  Abciximab Reopro 15 days 2 hrs  Alteplase Activase 10 days 10 days  Anagrelide Agrylin    Apixaban Eliquis 3 days 6 hrs  Cilostazol Pletal 3 days 5 hrs  Clopidogrel Plavix 7-10 days 2 hrs  Dabigatran Pradaxa 5 days 6 hrs  Dalteparin Fragmin 24 hours 4 hrs  Dipyridamole Aggrenox  11days 2 hrs  Edoxaban Lixiana; Savaysa 3 days 2 hrs  Enoxaparin  Lovenox 24 hours 4 hrs  Eptifibatide Integrillin 8 hours 2 hrs  Fondaparinux  Arixtra 72 hours 12 hrs  Hydroxychloroquine Plaquenil 11 days   Prasugrel Effient 7-10 days 6 hrs  Reteplase Retavase 10 days 10 days  Rivaroxaban Xarelto 3 days 6 hrs  Ticagrelor Brilinta 5-7 days 6 hrs  Ticlopidine Ticlid 10-14 days 2 hrs  Tinzaparin Innohep 24 hours 4 hrs  Tirofiban Aggrastat 8 hours 2 hrs  Warfarin Coumadin 5 days 2 hrs   Other medications with blood-thinning effects  Product indications Generic (Brand) names Note  Cholesterol Lipitor Stop 4 days before procedure  Blood thinner (injectable) Heparin (LMW or LMWH Heparin) Stop 24 hours before procedure  Cancer Ibrutinib (Imbruvica) Stop 7 days before procedure  Malaria/Rheumatoid Hydroxychloroquine (Plaquenil) Stop 11 days before procedure  Thrombolytics  10 days before or after procedures   Over-the-counter (OTC) Products with blood-thinning effects  Product Common names Stop Time  Aspirin > 325 mg Goody Powders, Excedrin, etc. 11 days  Aspirin ? 81 mg  7 days  Fish oil  4 days  Garlic supplements  7 days  Ginkgo biloba  36 hours  Ginseng  24 hours  NSAIDs Ibuprofen, Naprosyn, etc. 3 days  Vitamin E  4 days   ____________________________________________________________________________________________   ______________________________________________________________________________________________  Specialty Pain Scale  Introduction:  There are significant differences in how pain is reported. The word pain usually refers to physical pain, but it is also a common synonym of suffering. The medical community uses a scale from 0 (zero) to 10 (ten) to report pain level. Zero (0) is described as "no pain",  while ten (10) is described as "the worse pain you can imagine". The problem with this scale is that physical pain is reported along with suffering. Suffering refers to  mental pain, or more often yet it refers to any unpleasant feeling, emotion or aversion associated with the perception of harm or threat of harm. It is the psychological component of pain.  Pain Specialists prefer to separate the two components. The pain scale used by this practice is the Verbal Numerical Rating Scale (VNRS-11). This scale is for the physical pain only. DO NOT INCLUDE how your pain psychologically affects you. This scale is for adults 33 years of age and older. It has 11 (eleven) levels. The 1st level is 0/10. This means: "right now, I have no pain". In the context of pain management, it also means: "right now, my physical pain is under control with the current therapy".  General Information:  The scale should reflect your current level of pain. Unless you are specifically asked for the level of your worst pain, or your average pain. If you are asked for one of these two, then it should be understood that it is over the past 24 hours.  Levels 1 (one) through 5 (five) are described below, and can be treated as an outpatient. Ambulatory pain management facilities such as ours are more than adequate to treat these levels. Levels 6 (six) through 10 (ten) are also described below, however, these must be treated as a hospitalized patient. While levels 6 (six) and 7 (seven) may be evaluated at an urgent care facility, levels 8 (eight) through 10 (ten) constitute medical emergencies and as such, they belong in a hospital's emergency department. When having these levels (as described below), do not come to our office. Our facility is not equipped to manage these levels. Go directly to an urgent care facility or an emergency department to be evaluated.  Definitions:  Activities of Daily Living (ADL): Activities of daily living (ADL or ADLs) is a term used in healthcare to refer to people's daily self-care activities. Health professionals often use a person's ability or inability to perform ADLs as a  measurement of their functional status, particularly in regard to people post injury, with disabilities and the elderly. There are two ADL levels: Basic and Instrumental. Basic Activities of Daily Living (BADL  or BADLs) consist of self-care tasks that include: Bathing and showering; personal hygiene and grooming (including brushing/combing/styling hair); dressing; Toilet hygiene (getting to the toilet, cleaning oneself, and getting back up); eating and self-feeding (not including cooking or chewing and swallowing); functional mobility, often referred to as "transferring", as measured by the ability to walk, get in and out of bed, and get into and out of a chair; the broader definition (moving from one place to another while performing activities) is useful for people with different physical abilities who are still able to get around independently. Basic ADLs include the things many people do when they get up in the morning and get ready to go out of the house: get out of bed, go to the toilet, bathe, dress, groom, and eat. On the average, loss of function typically follows a particular order. Hygiene is the first to go, followed by loss of toilet use and locomotion. The last to go is the ability to eat. When there is only one remaining area in which the person is independent, there is a 62.9% chance that it is eating and only a 3.5% chance that it is hygiene. Instrumental Activities  of Daily Living (IADL or IADLs) are not necessary for fundamental functioning, but they let an individual live independently in a community. IADL consist of tasks that include: cleaning and maintaining the house; home establishment and maintenance; care of others (including selecting and supervising caregivers); care of pets; child rearing; managing money; managing financials (investments, etc.); meal preparation and cleanup; shopping for groceries and necessities; moving within the community; safety procedures and emergency  responses; health management and maintenance (taking prescribed medications); and using the telephone or other form of communication.  Instructions:  Most patients tend to report their pain as a combination of two factors, their physical pain and their psychosocial pain. This last one is also known as "suffering" and it is reflection of how physical pain affects you socially and psychologically. From now on, report them separately.  From this point on, when asked to report your pain level, report only your physical pain. Use the following table for reference.  Pain Clinic Pain Levels (0-5/10)  Pain Level Score  Description  No Pain 0   Mild pain 1 Nagging, annoying, but does not interfere with basic activities of daily living (ADL). Patients are able to eat, bathe, get dressed, toileting (being able to get on and off the toilet and perform personal hygiene functions), transfer (move in and out of bed or a chair without assistance), and maintain continence (able to control bladder and bowel functions). Blood pressure and heart rate are unaffected. A normal heart rate for a healthy adult ranges from 60 to 100 bpm (beats per minute).   Mild to moderate pain 2 Noticeable and distracting. Impossible to hide from other people. More frequent flare-ups. Still possible to adapt and function close to normal. It can be very annoying and may have occasional stronger flare-ups. With discipline, patients may get used to it and adapt.   Moderate pain 3 Interferes significantly with activities of daily living (ADL). It becomes difficult to feed, bathe, get dressed, get on and off the toilet or to perform personal hygiene functions. Difficult to get in and out of bed or a chair without assistance. Very distracting. With effort, it can be ignored when deeply involved in activities.   Moderately severe pain 4 Impossible to ignore for more than a few minutes. With effort, patients may still be able to manage work or  participate in some social activities. Very difficult to concentrate. Signs of autonomic nervous system discharge are evident: dilated pupils (mydriasis); mild sweating (diaphoresis); sleep interference. Heart rate becomes elevated (>115 bpm). Diastolic blood pressure (lower number) rises above 100 mmHg. Patients find relief in laying down and not moving.   Severe pain 5 Intense and extremely unpleasant. Associated with frowning face and frequent crying. Pain overwhelms the senses.  Ability to do any activity or maintain social relationships becomes significantly limited. Conversation becomes difficult. Pacing back and forth is common, as getting into a comfortable position is nearly impossible. Pain wakes you up from deep sleep. Physical signs will be obvious: pupillary dilation; increased sweating; goosebumps; brisk reflexes; cold, clammy hands and feet; nausea, vomiting or dry heaves; loss of appetite; significant sleep disturbance with inability to fall asleep or to remain asleep. When persistent, significant weight loss is observed due to the complete loss of appetite and sleep deprivation.  Blood pressure and heart rate becomes significantly elevated. Caution: If elevated blood pressure triggers a pounding headache associated with blurred vision, then the patient should immediately seek attention at an urgent or emergency care unit, as  these may be signs of an impending stroke.    Emergency Department Pain Levels (6-10/10)  Emergency Room Pain 6 Severely limiting. Requires emergency care and should not be seen or managed at an outpatient pain management facility. Communication becomes difficult and requires great effort. Assistance to reach the emergency department may be required. Facial flushing and profuse sweating along with potentially dangerous increases in heart rate and blood pressure will be evident.   Distressing pain 7 Self-care is very difficult. Assistance is required to transport, or  use restroom. Assistance to reach the emergency department will be required. Tasks requiring coordination, such as bathing and getting dressed become very difficult.   Disabling pain 8 Self-care is no longer possible. At this level, pain is disabling. The individual is unable to do even the most "basic" activities such as walking, eating, bathing, dressing, transferring to a bed, or toileting. Fine motor skills are lost. It is difficult to think clearly.   Incapacitating pain 9 Pain becomes incapacitating. Thought processing is no longer possible. Difficult to remember your own name. Control of movement and coordination are lost.   The worst pain imaginable 10 At this level, most patients pass out from pain. When this level is reached, collapse of the autonomic nervous system occurs, leading to a sudden drop in blood pressure and heart rate. This in turn results in a temporary and dramatic drop in blood flow to the brain, leading to a loss of consciousness. Fainting is one of the body's self defense mechanisms. Passing out puts the brain in a calmed state and causes it to shut down for a while, in order to begin the healing process.    Summary: 1.   Refer to this scale when providing [name] with your pain level. 2.   Be accurate and careful when reporting your pain level. This will help with your care. 3.   Over-reporting your pain level will lead to loss of credibility. 4.   Even a level of 1/10 means that there is pain and will be treated at our facility. 5.   High, inaccurate reporting will be documented as "Symptom Exaggeration", leading to loss of credibility and suspicions of possible secondary gains such as obtaining more narcotics, or wanting to appear disabled, for fraudulent reasons. 6.   Only pain levels of 5 or below will be seen at our facility. 7.   Pain levels of 6 and above will be sent to the Emergency Department and the appointment  cancelled.  ______________________________________________________________________________________________

## 2020-07-27 NOTE — Progress Notes (Addendum)
Safety precautions to be maintained throughout the outpatient stay will include: orient to surroundings, keep bed in low position, maintain call bell within reach at all times, provide assistance with transfer out of bed and ambulation.  

## 2020-07-27 NOTE — Progress Notes (Signed)
Nursing Pain Medication Assessment:  Safety precautions to be maintained throughout the outpatient stay will include: orient to surroundings, keep bed in low position, maintain call bell within reach at all times, provide assistance with transfer out of bed and ambulation.  Medication Inspection Compliance: Pill count conducted under aseptic conditions, in front of the patient. Neither the pills nor the bottle was removed from the patient's sight at any time. Once count was completed pills were immediately returned to the patient in their original bottle.  Medication: Oxycodone IR Pill/Patch Count: 40 of 120 pills remain Pill/Patch Appearance: Markings consistent with prescribed medication Bottle Appearance: Standard pharmacy container. Clearly labeled. Filled Date: 03 / 02 / 2022 Last Medication intake:  Today

## 2020-08-03 LAB — TOXASSURE SELECT 13 (MW), URINE

## 2020-08-15 ENCOUNTER — Encounter: Payer: Self-pay | Admitting: Pain Medicine

## 2020-08-15 ENCOUNTER — Other Ambulatory Visit: Payer: Self-pay

## 2020-08-15 ENCOUNTER — Ambulatory Visit (HOSPITAL_BASED_OUTPATIENT_CLINIC_OR_DEPARTMENT_OTHER): Payer: Medicare HMO | Admitting: Pain Medicine

## 2020-08-15 ENCOUNTER — Ambulatory Visit
Admission: RE | Admit: 2020-08-15 | Discharge: 2020-08-15 | Disposition: A | Discharge status: Home / Self Care | Payer: Medicare HMO | Source: Ambulatory Visit | Attending: Pain Medicine | Admitting: Pain Medicine

## 2020-08-15 VITALS — BP 132/74 | HR 62 | Temp 97.3°F | Resp 16 | Ht 65.0 in | Wt 262.0 lb

## 2020-08-15 DIAGNOSIS — M4126 Other idiopathic scoliosis, lumbar region: Secondary | ICD-10-CM | POA: Insufficient documentation

## 2020-08-15 DIAGNOSIS — M47817 Spondylosis without myelopathy or radiculopathy, lumbosacral region: Secondary | ICD-10-CM | POA: Insufficient documentation

## 2020-08-15 DIAGNOSIS — Z7901 Long term (current) use of anticoagulants: Secondary | ICD-10-CM | POA: Diagnosis present

## 2020-08-15 DIAGNOSIS — M4317 Spondylolisthesis, lumbosacral region: Secondary | ICD-10-CM

## 2020-08-15 DIAGNOSIS — G8929 Other chronic pain: Secondary | ICD-10-CM | POA: Diagnosis present

## 2020-08-15 DIAGNOSIS — M47816 Spondylosis without myelopathy or radiculopathy, lumbar region: Secondary | ICD-10-CM

## 2020-08-15 DIAGNOSIS — M7138 Other bursal cyst, other site: Secondary | ICD-10-CM

## 2020-08-15 DIAGNOSIS — M2540 Effusion, unspecified joint: Secondary | ICD-10-CM | POA: Diagnosis present

## 2020-08-15 DIAGNOSIS — M545 Low back pain, unspecified: Secondary | ICD-10-CM | POA: Insufficient documentation

## 2020-08-15 DIAGNOSIS — M47819 Spondylosis without myelopathy or radiculopathy, site unspecified: Secondary | ICD-10-CM | POA: Insufficient documentation

## 2020-08-15 DIAGNOSIS — M4316 Spondylolisthesis, lumbar region: Secondary | ICD-10-CM | POA: Insufficient documentation

## 2020-08-15 DIAGNOSIS — M5137 Other intervertebral disc degeneration, lumbosacral region: Secondary | ICD-10-CM | POA: Diagnosis not present

## 2020-08-15 MED ORDER — TRIAMCINOLONE ACETONIDE 40 MG/ML IJ SUSP
80.0000 mg | Freq: Once | INTRAMUSCULAR | Status: AC
  Administered 2020-08-15: 80 mg

## 2020-08-15 MED ORDER — MIDAZOLAM HCL 5 MG/5ML IJ SOLN
INTRAMUSCULAR | Status: AC
  Filled 2020-08-15: qty 5

## 2020-08-15 MED ORDER — LIDOCAINE HCL 2 % IJ SOLN
INTRAMUSCULAR | Status: AC
  Filled 2020-08-15: qty 20

## 2020-08-15 MED ORDER — FENTANYL CITRATE (PF) 100 MCG/2ML IJ SOLN
25.0000 ug | INTRAMUSCULAR | Status: DC | PRN
  Administered 2020-08-15: 50 ug via INTRAVENOUS

## 2020-08-15 MED ORDER — MIDAZOLAM HCL 5 MG/5ML IJ SOLN
1.0000 mg | INTRAMUSCULAR | Status: DC | PRN
  Administered 2020-08-15: 1 mg via INTRAVENOUS

## 2020-08-15 MED ORDER — ROPIVACAINE HCL 2 MG/ML IJ SOLN
INTRAMUSCULAR | Status: AC
  Filled 2020-08-15: qty 20

## 2020-08-15 MED ORDER — TRIAMCINOLONE ACETONIDE 40 MG/ML IJ SUSP
INTRAMUSCULAR | Status: AC
  Filled 2020-08-15: qty 2

## 2020-08-15 MED ORDER — ROPIVACAINE HCL 2 MG/ML IJ SOLN
18.0000 mL | Freq: Once | INTRAMUSCULAR | Status: AC
Start: 2020-08-15 — End: 2020-08-15
  Administered 2020-08-15: 18 mL via PERINEURAL

## 2020-08-15 MED ORDER — LACTATED RINGERS IV SOLN
1000.0000 mL | Freq: Once | INTRAVENOUS | Status: AC
Start: 2020-08-15 — End: 2020-08-15
  Administered 2020-08-15: 1000 mL via INTRAVENOUS

## 2020-08-15 MED ORDER — FENTANYL CITRATE (PF) 100 MCG/2ML IJ SOLN
INTRAMUSCULAR | Status: AC
  Filled 2020-08-15: qty 2

## 2020-08-15 MED ORDER — LIDOCAINE HCL 2 % IJ SOLN
20.0000 mL | Freq: Once | INTRAMUSCULAR | Status: AC
  Administered 2020-08-15: 400 mg

## 2020-08-15 NOTE — Progress Notes (Signed)
Safety precautions to be maintained throughout the outpatient stay will include: orient to surroundings, keep bed in low position, maintain call bell within reach at all times, provide assistance with transfer out of bed and ambulation.  

## 2020-08-15 NOTE — Patient Instructions (Signed)

## 2020-08-15 NOTE — Progress Notes (Signed)
PROVIDER NOTE: Information contained herein reflects review and annotations entered in association with encounter. Interpretation of such information and data should be left to medically-trained personnel. Information provided to patient can be located elsewhere in the medical record under "Patient Instructions". Document created using STT-dictation technology, any transcriptional errors that may result from process are unintentional.    Patient: Jamie Phillips  Service Category: Procedure  Provider: Oswaldo Done, MD  DOB: 1940-08-26  DOS: 08/15/2020  Location: [facility]  MRN: [id number]  Setting: Ambulatory - outpatient  Referring Provider: Lauro Regulus, MD  Type: Established Patient  Specialty: Interventional Pain Management  PCP: Lauro Regulus, MD   Primary Reason for Visit: Interventional Pain Management Treatment. CC: Back Pain (lower)  Procedure:          Anesthesia, Analgesia, Anxiolysis:  Type: Lumbar Facet, Medial Branch Block(s) #1  Primary Purpose: Diagnostic Region: Posterolateral Lumbosacral Spine Level: L2, L3, L4, L5, & S1 Medial Branch Level(s). Injecting these levels blocks the L3-4, L4-5, and L5-S1 lumbar facet joints. Laterality: Bilateral  Type: Moderate (Conscious) Sedation combined with Local Anesthesia Indication(s): Analgesia and Anxiety Route: Intravenous (IV) IV Access: Secured Sedation: Meaningful verbal contact was maintained at all times during the procedure  Local Anesthetic: Lidocaine 1-2%  Position: Prone   Indications: 1. Lumbar facet syndrome (Multilevel) (Bilateral)   2. Lumbar facet hypertrophy (Multilevel) (Bilateral)   3. 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 Spondylosis without myelopathy or radiculopathy, lumbosacral region   5. DDD (degenerative disc disease), lumbosacral   6. Lumbar scoliosis (idiopathic)   7. 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 Anterolisthesis of  lumbosacral spine (L5/S1)   9. 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 Chronic low back pain (Bilateral) w/o sciatica   11. Chronic anticoagulation (Eliquis)    Pain Score: Pre-procedure: 10-Worst pain ever/10 Post-procedure: 0-No pain/10   Pre-op H&P Assessment:  Jamie Phillips is a 80 y.o. (year old), female patient, seen today for interventional treatment. She  has a past surgical history that includes neck fusion; Wrist surgery (Left, 2000); Cardiac catheterization (N/A, 08/15/2015); IVC FILTER INSERTION (N/A, 06/11/2016); Colonoscopy; Joint replacement (Left, 06/17/2016); Colonoscopy with propofol (N/A, 03/10/2017); IVC FILTER REMOVAL (N/A, 05/06/2017); laparoscopy (N/A, 10/10/2018); laparoscopic appendectomy (N/A, 10/10/2018); Partial colectomy (Right, 10/10/2018); Appendectomy; Esophagogastroduodenoscopy (egd) with propofol (N/A, 02/02/2019); and Breast biopsy (Left, 08/19/04). Jamie Phillips has a current medication list which includes the following prescription(s): acetaminophen, albuterol, cholestyramine, duloxetine, eliquis, ergocalciferol, escitalopram, gabapentin, losartan-hydrochlorothiazide, multiple vitamins-minerals, oxycodone, pravastatin, sucralfate, and pantoprazole, and the following Facility-Administered Medications: fentanyl and midazolam. Her primarily concern today is the Back Pain (lower)  Initial Vital Signs:  Pulse/HCG Rate: 68ECG Heart Rate: 61 Temp: (!) 97.3 F (36.3 C) Resp: 16 BP: (!) 137/92 SpO2: 95 %  BMI: Estimated body mass index is 43.6 kg/m as calculated from the following:   Height as of this encounter: 5\' 5"  (1.651 m).   Weight as of this encounter: 262 lb (118.8 kg).  Risk Assessment: Allergies: Reviewed. She has No Known Allergies.  Allergy Precautions: None required Coagulopathies: Reviewed. None identified.  Blood-thinner therapy: None at this time Active Infection(s): Reviewed. None identified. Jamie Phillips is  afebrile  Site Confirmation: Jamie Phillips was asked to confirm the procedure and laterality before marking the site Procedure checklist: Completed Consent: Before the procedure and under the influence of no sedative(s), amnesic(s), or anxiolytics, the patient was informed of the  treatment options, risks and possible complications. To fulfill our ethical and legal obligations, as recommended by the American Medical Association's Code of Ethics, I have informed the patient of my clinical impression; the nature and purpose of the treatment or procedure; the risks, benefits, and possible complications of the intervention; the alternatives, including doing nothing; the risk(s) and benefit(s) of the alternative treatment(s) or procedure(s); and the risk(s) and benefit(s) of doing nothing. The patient was provided information about the general risks and possible complications associated with the procedure. These may include, but are not limited to: failure to achieve desired goals, infection, bleeding, organ or nerve damage, allergic reactions, paralysis, and death. In addition, the patient was informed of those risks and complications associated to Spine-related procedures, such as failure to decrease pain; infection (i.e.: Meningitis, epidural or intraspinal abscess); bleeding (i.e.: epidural hematoma, subarachnoid hemorrhage, or any other type of intraspinal or peri-dural bleeding); organ or nerve damage (i.e.: Any type of peripheral nerve, nerve root, or spinal cord injury) with subsequent damage to sensory, motor, and/or autonomic systems, resulting in permanent pain, numbness, and/or weakness of one or several areas of the body; allergic reactions; (i.e.: anaphylactic reaction); and/or death. Furthermore, the patient was informed of those risks and complications associated with the medications. These include, but are not limited to: allergic reactions (i.e.: anaphylactic or anaphylactoid reaction(s)); adrenal  axis suppression; blood sugar elevation that in diabetics may result in ketoacidosis or comma; water retention that in patients with history of congestive heart failure may result in shortness of breath, pulmonary edema, and decompensation with resultant heart failure; weight gain; swelling or edema; medication-induced neural toxicity; particulate matter embolism and blood vessel occlusion with resultant organ, and/or nervous system infarction; and/or aseptic necrosis of one or more joints. Finally, the patient was informed that Medicine is not an exact science; therefore, there is also the possibility of unforeseen or unpredictable risks and/or possible complications that may result in a catastrophic outcome. The patient indicated having understood very clearly. We have given the patient no guarantees and we have made no promises. Enough time was given to the patient to ask questions, all of which were answered to the patient's satisfaction. Jamie Phillips has indicated that she wanted to continue with the procedure. Attestation: I, the ordering provider, attest that I have discussed with the patient the benefits, risks, side-effects, alternatives, likelihood of achieving goals, and potential problems during recovery for the procedure that I have provided informed consent. Date  Time: 08/15/2020 10:23 AM  Pre-Procedure Preparation:  Monitoring: As per clinic protocol. Respiration, ETCO2, SpO2, BP, heart rate and rhythm monitor placed and checked for adequate function Safety Precautions: Patient was assessed for positional comfort and pressure points before starting the procedure. Time-out: I initiated and conducted the "Time-out" before starting the procedure, as per protocol. The patient was asked to participate by confirming the accuracy of the "Time Out" information. Verification of the correct person, site, and procedure were performed and confirmed by me, the nursing staff, and the patient. "Time-out"  conducted as per Joint Commission's Universal Protocol (UP.01.01.01). Time: 1045  Description of Procedure:          Laterality: Bilateral. The procedure was performed in identical fashion on both sides. Levels:  L2, L3, L4, L5, & S1 Medial Branch Level(s) Area Prepped: Posterior Lumbosacral Region DuraPrep (Iodine Povacrylex [0.7% available iodine] and Isopropyl Alcohol, 74% w/w) Safety Precautions: Aspiration looking for blood return was conducted prior to all injections. At no point did we inject any  substances, as a needle was being advanced. Before injecting, the patient was told to immediately notify me if she was experiencing any new onset of "ringing in the ears, or metallic taste in the mouth". No attempts were made at seeking any paresthesias. Safe injection practices and needle disposal techniques used. Medications properly checked for expiration dates. SDV (single dose vial) medications used. After the completion of the procedure, all disposable equipment used was discarded in the proper designated medical waste containers. Local Anesthesia: Protocol guidelines were followed. The patient was positioned over the fluoroscopy table. The area was prepped in the usual manner. The time-out was completed. The target area was identified using fluoroscopy. A 12-in long, straight, sterile hemostat was used with fluoroscopic guidance to locate the targets for each level blocked. Once located, the skin was marked with an approved surgical skin marker. Once all sites were marked, the skin (epidermis, dermis, and hypodermis), as well as deeper tissues (fat, connective tissue and muscle) were infiltrated with a small amount of a short-acting local anesthetic, loaded on a 10cc syringe with a 25G, 1.5-in  Needle. An appropriate amount of time was allowed for local anesthetics to take effect before proceeding to the next step. Local Anesthetic: Lidocaine 2.0% The unused portion of the local anesthetic was  discarded in the proper designated containers. Technical explanation of process:  L2 Medial Branch Nerve Block (MBB): The target area for the L2 medial branch is at the junction of the postero-lateral aspect of the superior articular process and the superior, posterior, and medial edge of the transverse process of L3. Under fluoroscopic guidance, a Quincke needle was inserted until contact was made with os over the superior postero-lateral aspect of the pedicular shadow (target area). After negative aspiration for blood, 0.5 mL of the nerve block solution was injected without difficulty or complication. The needle was removed intact. L3 Medial Branch Nerve Block (MBB): The target area for the L3 medial branch is at the junction of the postero-lateral aspect of the superior articular process and the superior, posterior, and medial edge of the transverse process of L4. Under fluoroscopic guidance, a Quincke needle was inserted until contact was made with os over the superior postero-lateral aspect of the pedicular shadow (target area). After negative aspiration for blood, 0.5 mL of the nerve block solution was injected without difficulty or complication. The needle was removed intact. L4 Medial Branch Nerve Block (MBB): The target area for the L4 medial branch is at the junction of the postero-lateral aspect of the superior articular process and the superior, posterior, and medial edge of the transverse process of L5. Under fluoroscopic guidance, a Quincke needle was inserted until contact was made with os over the superior postero-lateral aspect of the pedicular shadow (target area). After negative aspiration for blood, 0.5 mL of the nerve block solution was injected without difficulty or complication. The needle was removed intact. L5 Medial Branch Nerve Block (MBB): The target area for the L5 medial branch is at the junction of the postero-lateral aspect of the superior articular process and the superior,  posterior, and medial edge of the sacral ala. Under fluoroscopic guidance, a Quincke needle was inserted until contact was made with os over the superior postero-lateral aspect of the pedicular shadow (target area). After negative aspiration for blood, 0.5 mL of the nerve block solution was injected without difficulty or complication. The needle was removed intact. S1 Medial Branch Nerve Block (MBB): The target area for the S1 medial branch is at the  posterior and inferior 6 o'clock position of the L5-S1 facet joint. Under fluoroscopic guidance, the Quincke needle inserted for the L5 MBB was redirected until contact was made with os over the inferior and postero aspect of the sacrum, at the 6 o' clock position under the L5-S1 facet joint (Target area). After negative aspiration for blood, 0.5 mL of the nerve block solution was injected without difficulty or complication. The needle was removed intact.  Nerve block solution: 0.2% PF-Ropivacaine + Triamcinolone (40 mg/mL) diluted to a final concentration of 4 mg of Triamcinolone/mL of Ropivacaine The unused portion of the solution was discarded in the proper designated containers. Procedural Needles: 22-gauge, 3.5-inch, Quincke needles used for all levels.  Once the entire procedure was completed, the treated area was cleaned, making sure to leave some of the prepping solution back to take advantage of its long term bactericidal properties.      Illustration of the posterior view of the lumbar spine and the posterior neural structures. Laminae of L2 through S1 are labeled. DPRL5, dorsal primary ramus of L5; DPRS1, dorsal primary ramus of S1; DPR3, dorsal primary ramus of L3; FJ, facet (zygapophyseal) joint L3-L4; I, inferior articular process of L4; LB1, lateral branch of dorsal primary ramus of L1; IAB, inferior articular branches from L3 medial branch (supplies L4-L5 facet joint); IBP, intermediate branch plexus; MB3, medial branch of dorsal primary  ramus of L3; NR3, third lumbar nerve root; S, superior articular process of L5; SAB, superior articular branches from L4 (supplies L4-5 facet joint also); TP3, transverse process of L3.  Vitals:   08/15/20 1100 08/15/20 1110 08/15/20 1120 08/15/20 1130  BP: 119/81 119/82 122/90 132/74  Pulse:  60 (!) 56 62  Resp: (!) 23 17 17 16   Temp:      TempSrc:      SpO2: 95% 96% 98% 99%  Weight:      Height:         Start Time: 1045 hrs. End Time: 1057 hrs.  Imaging Guidance (Spinal):          Type of Imaging Technique: Fluoroscopy Guidance (Spinal) Indication(s): Assistance in needle guidance and placement for procedures requiring needle placement in or near specific anatomical locations not easily accessible without such assistance. Exposure Time: Please see nurses notes. Contrast: None used. Fluoroscopic Guidance: I was personally present during the use of fluoroscopy. "Tunnel Vision Technique" used to obtain the best possible view of the target area. Parallax error corrected before commencing the procedure. "Direction-depth-direction" technique used to introduce the needle under continuous pulsed fluoroscopy. Once target was reached, antero-posterior, oblique, and lateral fluoroscopic projection used confirm needle placement in all planes. Images permanently stored in EMR. Interpretation: No contrast injected. I personally interpreted the imaging intraoperatively. Adequate needle placement confirmed in multiple planes. Permanent images saved into the patient's record.  Antibiotic Prophylaxis:   Anti-infectives (From admission, onward)   None     Indication(s): None identified  Post-operative Assessment:  Post-procedure Vital Signs:  Pulse/HCG Rate: 62 (nsr)76 Temp: (!) 97.3 F (36.3 C) Resp: 16 BP: 132/74 SpO2: 99 %  EBL: None  Complications: No immediate post-treatment complications observed by team, or reported by patient.  Note: The patient tolerated the entire procedure  well. A repeat set of vitals were taken after the procedure and the patient was kept under observation following institutional policy, for this type of procedure. Post-procedural neurological assessment was performed, showing return to baseline, prior to discharge. The patient was provided with post-procedure discharge instructions, including  a section on how to identify potential problems. Should any problems arise concerning this procedure, the patient was given instructions to immediately contact us, at any time, without hesitation. In any case, we plan to contact the patient by telephone for a follow-up status report regarding this interventional procedure.  Comments:  No additional relevant information.  Plan of Care  Orders:  Orders Placed This Encounter  Procedures  . LUMBAR FACET(MEDIAL BRANCH NERVE BLOCK) MBNB    Scheduling Instructions:     Procedure: Lumbar facet block (AKA.: Lumbosacral medial branch nerve block)     Side: Bilateral     Level: L3-4, L4-5, & L5-S1 Facets (L2, L3, L4, L5, & S1 Medial Branch Nerves)     Sedation: Patient's choice.     Timeframe: Today    Order Specific Question:   Where will this procedure be performed?    Answer:   [facility]  . [facility] C-ARM 1-60 MIN NO REPORT    Intraoperative interpretation by procedural physician at [facility].    Standing Status:   Standing    Number of Occurrences:   1    Order Specific Question:   Reason for exam:    Answer:   Assistance in needle guidance and placement for procedures requiring needle placement in or near specific anatomical locations not easily accessible without such assistance.  . Informed Consent Details: Physician/Practitioner Attestation; Transcribe to consent form and obtain patient signature    Nursing Order: Transcribe to consent form and obtain patient signature. Note: Always confirm laterality of pain with Jamie Phillips, before procedure.    Order Specific Question:    Physician/Practitioner attestation of informed consent for procedure/surgical case    Answer:   I, the physician/practitioner, attest that I have discussed with the patient the benefits, risks, side effects, alternatives, likelihood of achieving goals and potential problems during recovery for the procedure that I have provided informed consent.    Order Specific Question:   Procedure    Answer:   Lumbar Facet Block  under fluoroscopic guidance    Order Specific Question:   Physician/Practitioner performing the procedure    Answer:   Johnryan Sao A. 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  . Care order/instruction: Please confirm that the patient has stopped the Eliquis (Apixaban) x 3 days prior to procedure or surgery.    Please confirm that the patient has stopped the Eliquis (Apixaban) x 3 days prior to procedure or surgery.    Standing Status:   Standing    Number of Occurrences:   1  . Provide equipment / supplies at bedside    "Block Tray" (Disposable  single use) Needle type: SpinalSpinal Amount/quantity: 4 Size: Medium (5-inch) Gauge: 22G    Standing Status:   Standing    Number of Occurrences:   1    Order Specific Question:   Specify    Answer:   Block Tray  . Bleeding precautions    Standing Status:   Standing    Number of Occurrences:   1   Chronic Opioid Analgesic:  Tramadol 50 mg, 1 tab p.o. 4 times daily (200 mg/day of tramadol) MME/day: 20 mg/day   Medications ordered for procedure: Meds ordered this encounter  Medications  . lidocaine (XYLOCAINE) 2 % (  with pres) injection 400 mg  . lactated ringers infusion 1,000 mL  . midazolam (VERSED) 5 MG/5ML injection 1-2 mg    Make sure Flumazenil is available in the pyxis when using this medication. If oversedation occurs,  administer 0.2 mg IV over 15 sec. If after 45 sec no response, administer 0.2 mg again over 1 min; may repeat at 1 min intervals; not to exceed 4 doses (1 mg)  . fentaNYL (SUBLIMAZE) injection 25-50 mcg    Make sure Narcan is available in the pyxis when using this medication. In the event of respiratory depression (RR< 8/min): Titrate NARCAN (naloxone) in increments of 0.1 to 0.2 mg IV at 2-3 minute intervals, until desired degree of reversal.  . ropivacaine (PF) 2 mg/mL (0.2%) (NAROPIN) injection 18 mL  . triamcinolone acetonide (KENALOG-40) injection 80 mg   Medications administered: We administered lidocaine, lactated ringers, midazolam, fentaNYL, ropivacaine (PF) 2 mg/mL (0.2%), and triamcinolone acetonide.  See the medical record for exact dosing, route, and time of administration.  Follow-up plan:   Return in about 2 weeks (around 08/29/2020) for on afternoon of procedure day, (F2F), (PPE).       Interventional Therapies  Risk  Complexity Considerations:   NOTE: Eliquis Anticoagulation: (Stop: 3 days  Restart: 6 hrs)   Planned  Pending:   Diagnostic bilateral lumbar facet block #1 (08/15/2020)    Under consideration:   Diagnostic midline T12-L1 LESI    Completed:   Diagnostic bilateral L3 TFESI x1 (07/11/2020) (100/100/100/100% relief of the bilateral L1/L2 radiculitis)  Diagnostic right L2-3 LESI x1 (07/11/2020) (100/100/100/100% relief of the bilateral L1/L2 radiculitis)    Therapeutic  Palliative (PRN) options:   Palliative/therapeutic bilateral L3 TFESI #2 + right L2-3 LESI #2     Recent Visits Date Type Provider Dept  07/27/20 Office Visit Milinda Pointer, MD [facility]  07/11/20 Procedure visit Milinda Pointer, [facility]  06/21/20 Office Visit Milinda Pointer, MD [facility]  Showing recent visits within past 90 days and meeting all other requirements Today's Visits Date Type Provider Dept  08/15/20 Procedure visit  Milinda Pointer, MD [facility]  Showing today's visits and meeting all other requirements Future Appointments Date Type Provider Dept  08/24/20 Appointment Milinda Pointer, MD [facility]  Showing future appointments within next 90 days and meeting all other requirements  Disposition: Discharge home  Discharge (Date  Time): 08/15/2020; 1134 hrs.   Primary Care Physician: Kirk Ruths, MD Location: [facility] Note by: Gaspar Cola, MD Date: 08/15/2020; Time: 11:45 AM  Disclaimer:  Medicine is not an Chief Strategy Officer. The only guarantee in medicine is that nothing is guaranteed. It is important to note that the decision to proceed with this intervention was based on the information collected from the patient. The Data and conclusions were drawn from the patient's questionnaire, the interview, and the physical examination. Because the information was provided in large part by the patient, it cannot be guaranteed that it has not been purposely or unconsciously manipulated. Every effort has been made to obtain as much relevant data as possible for this evaluation. It is important to note that the conclusions that lead to this procedure are derived in large part from the available data. Always take into account that the treatment will also be dependent on availability of resources and existing treatment guidelines, considered by other Pain Management Practitioners as being common knowledge and practice, at the time of the intervention. For Medico-Legal  purposes, it is also important to point out that variation in procedural techniques and pharmacological choices are the acceptable norm. The indications, contraindications, technique, and results of the above procedure should only be interpreted and judged by a Board-Certified Interventional Pain Specialist with extensive familiarity and expertise in the same exact procedure and technique.

## 2020-08-16 ENCOUNTER — Telehealth: Payer: Self-pay | Admitting: *Deleted

## 2020-08-16 NOTE — Telephone Encounter (Signed)
Voicemail left with patient re; procedure on yesterday to please call if there are any questions or concerns.

## 2020-08-22 ENCOUNTER — Encounter: Payer: Self-pay | Admitting: Nurse Practitioner

## 2020-08-24 ENCOUNTER — Encounter: Payer: Self-pay | Admitting: Pain Medicine

## 2020-08-24 ENCOUNTER — Other Ambulatory Visit: Payer: Self-pay

## 2020-08-24 ENCOUNTER — Ambulatory Visit: Payer: Medicare HMO | Attending: Pain Medicine | Admitting: Pain Medicine

## 2020-08-24 VITALS — BP 111/72 | HR 60 | Temp 97.0°F | Resp 16 | Ht 65.0 in | Wt 262.0 lb

## 2020-08-24 DIAGNOSIS — G894 Chronic pain syndrome: Secondary | ICD-10-CM | POA: Diagnosis not present

## 2020-08-24 DIAGNOSIS — Z79891 Long term (current) use of opiate analgesic: Secondary | ICD-10-CM | POA: Diagnosis not present

## 2020-08-24 DIAGNOSIS — M48062 Spinal stenosis, lumbar region with neurogenic claudication: Secondary | ICD-10-CM | POA: Diagnosis not present

## 2020-08-24 DIAGNOSIS — M545 Low back pain, unspecified: Secondary | ICD-10-CM | POA: Insufficient documentation

## 2020-08-24 DIAGNOSIS — M5410 Radiculopathy, site unspecified: Secondary | ICD-10-CM | POA: Insufficient documentation

## 2020-08-24 DIAGNOSIS — M5417 Radiculopathy, lumbosacral region: Secondary | ICD-10-CM | POA: Diagnosis not present

## 2020-08-24 DIAGNOSIS — Z79899 Other long term (current) drug therapy: Secondary | ICD-10-CM | POA: Insufficient documentation

## 2020-08-24 DIAGNOSIS — G8929 Other chronic pain: Secondary | ICD-10-CM | POA: Insufficient documentation

## 2020-08-24 DIAGNOSIS — M47816 Spondylosis without myelopathy or radiculopathy, lumbar region: Secondary | ICD-10-CM | POA: Diagnosis not present

## 2020-08-24 DIAGNOSIS — Z7901 Long term (current) use of anticoagulants: Secondary | ICD-10-CM | POA: Diagnosis not present

## 2020-08-24 DIAGNOSIS — M792 Neuralgia and neuritis, unspecified: Secondary | ICD-10-CM | POA: Diagnosis not present

## 2020-08-24 MED ORDER — OXYCODONE HCL 5 MG PO TABS: 5.0000 mg | ORAL_TABLET | Freq: Three times a day (TID) | ORAL | 0 refills | Status: DC | PRN

## 2020-08-24 NOTE — Progress Notes (Signed)
PROVIDER NOTE: Information contained herein reflects review and annotations entered in association with encounter. Interpretation of such information and data should be left to medically-trained personnel. Information provided to patient can be located elsewhere in the medical record under "Patient Instructions". Document created using STT-dictation technology, any transcriptional errors that may result from process are unintentional.    Patient: Jamie Phillips  Service Category: E/M  Provider: Gaspar Cola, MD  DOB: 02-Feb-1941  DOS: 08/24/2020  Specialty: Interventional Pain Management  MRN: [id number]  Setting: Ambulatory outpatient  PCP: Kirk Ruths, MD  Type: Established Patient    Referring Provider: Kirk Ruths, MD  Location: Office  Delivery: Face-to-face     HPI  Jamie Phillips, a 80 y.o. year old female, is here today because of her Lumbar facet joint syndrome [M47.816]. Jamie Phillips primary complain today is Leg Pain (Bilateral tops of thighs) Last encounter: My last encounter with her was on 08/15/2020. Pertinent problems: Jamie Phillips has Pain in the chest; CLL (chronic lymphocytic leukemia) ([place]); DJD (degenerative joint disease); Chronic lower extremity pain (1ry area of Pain) (Bilateral); Right lower quadrant pain; DDD (degenerative disc disease), lumbosacral; Generalized OA; History of Bell's palsy; Pes anserine bursitis; RLS (restless legs syndrome); Walker as ambulation aid; Chronic pain syndrome; Chronic low back pain (2ry area of Pain) (Bilateral) w/ sciatica (Bilateral); Lumbosacral radiculopathy at L1 (Bilateral); Lumbosacral radiculopathy at L5 (Left); Lumbosacral radiculopathy at S1 (Right); 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); Abnormal MRI, lumbar spine (01/24/2020); Lumbar facet syndrome (Multilevel) (Bilateral); Lumbar facet hypertrophy (Multilevel) (Bilateral); Severe muscle deconditioning; Lower extremity weakness  (Bilateral); Lumbosacral facet hypertrophy (Multilevel) (Bilateral); Lumbar scoliosis (idiopathic); Spondylolisthesis of lumbosacral region (Multilevel); Retrolisthesis (L1/L2); Anterolisthesis of lumbar spine (L3/L4, L4/L5); Anterolisthesis of lumbosacral spine (L5/S1); Lumbar lateral recess stenosis (Bilateral: L3-4, L4-5) (Severe); Lumbar foraminal stenosis (Right: L4-5); Synovial cyst of lumbar facet joint (L3-4) (Right); Arthropathy of spinal facet joint concurrent with and due to effusion (L3-4); Annular tear of lumbar disc (L3-4) (Left); Chronic lower extremity radicular pain (L1/L2 dermatomes) (Bilateral); Chronic groin pain (Bilateral); Neurogenic pain, lower extremity (Bilateral); Shooting pain, intermittent (electrical-like); History of total knee replacement (Bilateral) ; Spondylosis without myelopathy or radiculopathy, lumbosacral region; and Chronic low back pain (Bilateral) w/o sciatica on their pertinent problem list. Pain Assessment: Severity of Chronic pain is reported as a 6 /10. Location: Leg Right,Left/tops of both thighs. Onset: More than a month ago. Quality: Dull,Aching. Timing: Constant. Modifying factor(s): nothing. Vitals:  height is '5\' 5"'  (1.651 m) and weight is 262 lb (118.8 kg). Her temperature is 97 F (36.1 C) (abnormal). Her blood pressure is 111/72 and her pulse is 60. Her respiration is 16 and oxygen saturation is 97%.   Reason for encounter: both, medication management and post-procedure assessment.  The patient returns to the clinic today after having had her first bilateral lumbar facet block under fluoroscopic guidance and IV sedation.  The patient attained 100% relief of the pain for the duration of the local anesthetic which then went down to approximately a 50% improvement.  Her initial impression was that it did not help, but this has to do with her expectations.  Today I took the time to explain to the patient that the pain from the lower back and that of the lower  extremities today originate in different places.  I have explained to her that the diagnostic injection that we had done was primarily to address the issue of the lower back.  When evaluating  the treated area only, then the response that we get is significantly different.  She indicates that she has attained significant relief of the lower back where her primary pain now is not that of the lower back but the lower extremities.  Unfortunately, it was very difficult to get information out of the patient because she continued to makes the low back pain with the lower extremity pain in terms of giving [name] feedback from the diagnostic lumbar facet block.  When we separated the 2, then we have that currently she is having some pain in the lower extremities which is a lot more than the pain that she now has in the lower back.  Today she indicates that the worst pain is the 1 that she is experiencing of the thighs or the anterior portion and rotating towards the medial aspect of her upper thighs, going over the groin area.  Essentially she was describing an L1/L2 dermatomal distribution.  Today I have reviewed her lumbar CT and there is evidence at the L1-2 level of narrowing of the lateral recess due to the facet and ligamentum flavum hypertrophy.  At the L2-3 level there are again disc bulges and facet and ligamentum flavum hypertrophy with narrowing of the lateral recess.  At the L3-4 level there is a degenerative, 3 mm anterolisthesis with bilateral facet arthropathy.  In view of this, today we have decided to schedule her to return for a midline L1-2 LESI, in combination with a bilateral L2 transforaminal epidural steroid injection, under fluoroscopic guidance and IV sedation.  The patient indicates today that Dr. Ouida Sills is already writing for her gabapentin.  Today I asked her how she was taking it and she indicated that her schedule is variable depending on how much pain she has.  I have explained to her that in  order for the medicine to work properly, she needs to take it on a regular schedule since it takes approximately 2 to 3 weeks for adequate blood levels to develop on any particular dose or schedule.  She understood and accepted.  She told me not to send any refills on the medication since Dr. Ouida Sills has been writing for it.  Today the patient also indicated that she feels that the oxycodone does not seem to be helping with her pain.  She feels that the tramadol seem to have worked better.  However, she is not sure about this and therefore we will make no changes at this point.  RTCB: 10/13/2020 Nonopioids transfer 08/24/2020: Neurontin  Post-Procedure Evaluation  Procedure (08/15/2020): Diagnostic bilateral lumbar facet MBB #1 under fluoroscopic guidance and IV sedation Pre-procedure pain level: 10/10 Post-procedure: 0/10 (100% relief)  Sedation: Sedation provided.  Effectiveness during initial hour after procedure(Ultra-Short Term Relief): 100 %.  Local anesthetic used: Long-acting (4-6 hours) Effectiveness: Defined as any analgesic benefit obtained secondary to the administration of local anesthetics. This carries significant diagnostic value as to the etiological location, or anatomical origin, of the pain. Duration of benefit is expected to coincide with the duration of the local anesthetic used.  Effectiveness during initial 4-6 hours after procedure(Short-Term Relief): 100 %.  Long-term benefit: Defined as any relief past the pharmacologic duration of the local anesthetics.  Effectiveness past the initial 6 hours after procedure(Long-Term Relief): 50 % (back feels better, but no relief in the top of legs).  Current benefits: Defined as benefit that persist at this time.   Analgesia:  Ongoing 50% relief of the pain in the lower back, but  no benefit on the lower extremity pain. Function: Somewhat improved ROM: Somewhat improved  Pharmacotherapy Assessment   Analgesic: Tramadol 50 mg, 1  tab p.o. 4 times daily (200 mg/day of tramadol) MME/day: 20 mg/day   Monitoring: [place] PMP: PDMP reviewed during this encounter.       Pharmacotherapy: No side-effects or adverse reactions reported. Compliance: No problems identified. Effectiveness: Clinically acceptable.  Dewayne Shorter, RN  08/24/2020 11:39 AM  Sign when Signing Visit Nursing Pain Medication Assessment:  Safety precautions to be maintained throughout the outpatient stay will include: orient to surroundings, keep bed in low position, maintain call bell within reach at all times, provide assistance with transfer out of bed and ambulation.  Medication Inspection Compliance: Pill count conducted under aseptic conditions, in front of the patient. Neither the pills nor the bottle was removed from the patient's sight at any time. Once count was completed pills were immediately returned to the patient in their original bottle.  Medication: Oxycodone IR Pill/Patch Count: 52 of 65 pills remain Pill/Patch Appearance: Markings consistent with prescribed medication Bottle Appearance: Standard pharmacy container. Clearly labeled. Filled Date: 04 / 26 / 2022 Last Medication intake:  Today    UDS:  Summary  Date Value Ref Range Status  07/27/2020 Note  Final    Comment:    ==================================================================== [employer] [facility] 13 (MW) ==================================================================== Test                             Result       Flag       Units  Drug Present and Declared for Prescription Verification   Oxycodone                      1206         EXPECTED   ng/mg creat   Oxymorphone                    90           EXPECTED   ng/mg creat   Noroxycodone                   2368         EXPECTED   ng/mg creat    Sources of oxycodone include scheduled prescription medications.    Oxymorphone and noroxycodone are expected metabolites of oxycodone.    Oxymorphone is also available as a  scheduled prescription medication.  ==================================================================== Test                      Result    Flag   Units      Ref Range   Creatinine              212              mg/dL      >=20 ==================================================================== Declared Medications:  The flagging and interpretation on this report are based on the  following declared medications.  Unexpected results may arise from  inaccuracies in the declared medications.   **Note: The testing scope of this panel includes these medications:   Oxycodone   **Note: The testing scope of this panel does not include the  following reported medications:   Acetaminophen (Tylenol)  Albuterol (Ventolin HFA)  Apixaban (Eliquis)  Cholestyramine (Questran)  Duloxetine (Cymbalta)  Escitalopram (Lexapro)  Gabapentin (Neurontin)  Hydrochlorothiazide (Hyzaar)  Losartan (Hyzaar)  Pantoprazole (  Protonix)  Pravastatin (Pravachol)  Sucralfate (Carafate) ==================================================================== For clinical consultation, please call [phone]. ====================================================================      ROS  Constitutional: Denies any fever or chills Gastrointestinal: No reported hemesis, hematochezia, vomiting, or acute GI distress Musculoskeletal: Denies any acute onset joint swelling, redness, loss of ROM, or weakness Neurological: No reported episodes of acute onset apraxia, aphasia, dysarthria, agnosia, amnesia, paralysis, loss of coordination, or loss of consciousness  Medication Review  DULoxetine, Multiple Vitamins-Minerals, acetaminophen, albuterol, apixaban, cholestyramine, ergocalciferol, escitalopram, gabapentin, losartan-hydrochlorothiazide, oxyCODONE, pantoprazole, pravastatin, and sucralfate  History Review  Allergy: Jamie Phillips has No Known Allergies. Drug: Jamie Phillips  reports no history of drug use. Alcohol:   reports no history of alcohol use. Tobacco:  reports that she has never smoked. She has never used smokeless tobacco. Social: Jamie Phillips  reports that she has never smoked. She has never used smokeless tobacco. She reports that she does not drink alcohol and does not use drugs. 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 Surgical: Jamie Phillips  has a past surgical history that includes neck fusion; Wrist surgery (Left, 2000); Cardiac catheterization (N/A, 08/15/2015); IVC FILTER INSERTION (N/A, 06/11/2016); Colonoscopy; Joint replacement (Left, 06/17/2016); Colonoscopy with propofol (N/A, 03/10/2017); IVC FILTER REMOVAL (N/A, 05/06/2017); laparoscopy (N/A, 10/10/2018); laparoscopic appendectomy (N/A, 10/10/2018); Partial colectomy (Right, 10/10/2018); Appendectomy; Esophagogastroduodenoscopy (egd) with propofol (N/A, 02/02/2019); and Breast biopsy (Left, 08/19/04). Family: family history includes Congestive Heart Failure in her father; Heart failure in her father; Pulmonary embolism in her mother.  Laboratory Chemistry Profile   Renal Lab Results  Component Value Date   BUN 16 06/21/2020   CREATININE 0.93 06/21/2020   BCR 17 06/21/2020   GFRAA 55 (L) 09/03/2019   GFRNONAA >60 06/16/2020     Hepatic Lab Results  Component Value Date   AST 16 06/21/2020   ALT 15 03/06/2020   ALBUMIN 4.2 06/21/2020   ALKPHOS 104 06/21/2020   LIPASE 37 10/09/2018     Electrolytes Lab  Results  Component Value Date   NA 144 06/21/2020   K 4.5 06/21/2020   CL 103 06/21/2020   CALCIUM 9.5 06/21/2020   MG 1.9 06/21/2020     Bone Lab Results  Component Value Date   25OHVITD1 14 (L) 06/21/2020   25OHVITD2 <1.0 06/21/2020   25OHVITD3 14 06/21/2020     Inflammation (CRP: Acute Phase) (ESR: Chronic Phase) Lab Results  Component Value Date   CRP 9 06/21/2020   ESRSEDRATE 21 06/21/2020   LATICACIDVEN 0.9 12/13/2018       Note: Above Lab results reviewed.  Recent Imaging Review  DG PAIN CLINIC C-ARM 1-60 MIN NO REPORT Fluoro was used, but no Radiologist interpretation will be provided.  Please refer to "NOTES" tab for provider progress note. Note: Reviewed        Physical Exam  General appearance: Well nourished, well developed, and well hydrated. In no apparent acute distress Mental status: Alert, oriented x 3 (person, place, & time)       Respiratory: No evidence of acute respiratory distress Eyes: PERLA Vitals: BP 111/72   Pulse 60   Temp (!) 97 F (36.1 C)  Resp 16   Ht '5\' 5"'  (1.651 m)   Wt 262 lb (118.8 kg)   SpO2 97%   BMI 43.60 kg/m  BMI: Estimated body mass index is 43.6 kg/m as calculated from the following:   Height as of this encounter: '5\' 5"'  (1.651 m).   Weight as of this encounter: 262 lb (118.8 kg). Ideal: Ideal body weight: 57 kg (125 lb 10.6 oz) Adjusted ideal body weight: 81.7 kg (180 lb 3.2 oz)  Assessment   Status Diagnosis  Controlled Controlled Controlled 1. Lumbar facet syndrome (Multilevel) (Bilateral)   2. Chronic low back pain (Bilateral) w/o sciatica   3. Chronic pain syndrome   4. Pharmacologic therapy   5. Chronic use of opiate for therapeutic purpose   6. Chronic anticoagulation (Eliquis)   7. Neurogenic pain, leg, unspecified laterality   8. Chronic lower extremity radicular pain (L1/L2 dermatomes) (Bilateral)   9. 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 Lumbosacral  radiculopathy at L1 (Bilateral)      Updated Problems: No problems updated.  Plan of Care  Problem-specific:  No problem-specific Assessment & Plan notes found for this encounter.  Ms. CAROLLEE Phillips has a current medication list which includes the following long-term medication(s): albuterol, cholestyramine, duloxetine, eliquis, escitalopram, gabapentin, losartan-hydrochlorothiazide, pantoprazole, and [START ON 09/13/2020] oxycodone.  Pharmacotherapy (Medications Ordered): Meds ordered this encounter  Medications  . oxyCODONE (OXY IR/ROXICODONE) 5 MG immediate release tablet    Sig: Take 1 tablet (5 mg total) by mouth 3 (three) times daily as needed for severe pain. Must last 30 days.    Dispense:  65 tablet    Refill:  0    Not a duplicate. Do NOT delete! Dispense 1 day early if closed on refill date. Avoid benzodiazepines within 8 hours of opioids. Do not send refill requests.   Orders:  Orders Placed This Encounter  Procedures  . Lumbar Epidural Injection    Standing Status:   Future    Standing Expiration Date:   09/24/2020    Scheduling Instructions:     Procedure: Interlaminar Lumbar Epidural Steroid injection (LESI)  L1-2     Laterality: Midline     Sedation: With Sedation.     Timeframe: ASAA    Order Specific Question:   Where will this procedure be performed?    Answer:   [facility]  . Lumbar Transforaminal Epidural    Standing Status:   Future    Standing Expiration Date:   09/24/2020    Scheduling Instructions:     Side: Bilateral     Level: L2     Sedation: Patient's choice.     Timeframe: ASAA    Order Specific Question:   Where will this procedure be performed?    Answer:   [facility]  . Blood Thinner Instructions to Nursing    Always make sure patient has clearance from prescribing physician to stop blood thinners for interventional therapies. If the patient requires a Lovenox-bridge therapy, make sure arrangements are made to institute it  with the assistance of the PCP.    Scheduling Instructions:     Have Jamie Phillips stop the Eliquis (Apixaban) x 3 days prior to procedure or surgery.   Follow-up plan:   Return for Procedure (w/ sedation): (ML) L1-2 LESI + (B) L2 TFESI.      Interventional Therapies  Risk  Complexity Considerations:   NOTE: Eliquis Anticoagulation: (Stop: 3 days  Restart: 6 hrs)  Planned  Pending:   Diagnostic bilateral lumbar facet block #1 (08/15/2020)    Under consideration:   Diagnostic midline T12-L1 LESI    Completed:   Diagnostic bilateral L3 TFESI x1 (07/11/2020) (100/100/100/100% relief of the bilateral L1/L2 radiculitis)  Diagnostic right L2-3 LESI x1 (07/11/2020) (100/100/100/100% relief of the bilateral L1/L2 radiculitis)    Therapeutic  Palliative (PRN) options:   Palliative/therapeutic bilateral L3 TFESI #2 + right L2-3 LESI #2      Recent Visits Date Type Provider Dept  08/15/20 Procedure visit Milinda Pointer, MD [facility]  07/27/20 Office Visit Milinda Pointer, MD [facility]  07/11/20 Procedure visit Milinda Pointer, MD [facility]  06/21/20 Office Visit Milinda Pointer, MD [facility]  Showing recent visits within past 90 days and meeting all other requirements Today's Visits Date Type Provider Dept  08/24/20 Office Visit Milinda Pointer, MD [facility]  Showing today's visits and meeting all other requirements Future Appointments No visits were found meeting these conditions. Showing future appointments within next 90 days and meeting all other requirements  I discussed the assessment and treatment plan with the patient. The patient was provided an opportunity to ask questions and all were answered. The patient agreed with the plan and demonstrated an understanding of the instructions.  Patient advised to call back or seek an in-person evaluation if the symptoms or condition worsens.  Duration of  encounter: 30 minutes.  Note by: Gaspar Cola, MD Date: 08/24/2020; Time: 5:20 PM

## 2020-08-24 NOTE — Patient Instructions (Addendum)
Stop Eliquis for 3 days prior to procedure  ______________________________________________________________________________________________  Specialty Pain Scale  Introduction:  There are significant differences in how pain is reported. The word pain usually refers to physical pain, but it is also a common synonym of suffering. The medical community uses a scale from 0 (zero) to 10 (ten) to report pain level. Zero (0) is described as "no pain", while ten (10) is described as "the worse pain you can imagine". The problem with this scale is that physical pain is reported along with suffering. Suffering refers to mental pain, or more often yet it refers to any unpleasant feeling, emotion or aversion associated with the perception of harm or threat of harm. It is the psychological component of pain.  Pain Specialists prefer to separate the two components. The pain scale used by this practice is the Verbal Numerical Rating Scale (VNRS-11). This scale is for the physical pain only. DO NOT INCLUDE how your pain psychologically affects you. This scale is for adults 42 years of age and older. It has 11 (eleven) levels. The 1st level is 0/10. This means: "right now, I have no pain". In the context of pain management, it also means: "right now, my physical pain is under control with the current therapy".  General Information:  The scale should reflect your current level of pain. Unless you are specifically asked for the level of your worst pain, or your average pain. If you are asked for one of these two, then it should be understood that it is over the past 24 hours.  Levels 1 (one) through 5 (five) are described below, and can be treated as an outpatient. Ambulatory pain management facilities such as ours are more than adequate to treat these levels. Levels 6 (six) through 10 (ten) are also described below, however, these must be treated as a hospitalized patient. While levels 6 (six) and 7 (seven) may be  evaluated at an urgent care facility, levels 8 (eight) through 10 (ten) constitute medical emergencies and as such, they belong in a hospital's emergency department. When having these levels (as described below), do not come to our office. Our facility is not equipped to manage these levels. Go directly to an urgent care facility or an emergency department to be evaluated.  Definitions:  Activities of Daily Living (ADL): Activities of daily living (ADL or ADLs) is a term used in healthcare to refer to people's daily self-care activities. Health professionals often use a person's ability or inability to perform ADLs as a measurement of their functional status, particularly in regard to people post injury, with disabilities and the elderly. There are two ADL levels: Basic and Instrumental. Basic Activities of Daily Living (BADL  or BADLs) consist of self-care tasks that include: Bathing and showering; personal hygiene and grooming (including brushing/combing/styling hair); dressing; Toilet hygiene (getting to the toilet, cleaning oneself, and getting back up); eating and self-feeding (not including cooking or chewing and swallowing); functional mobility, often referred to as "transferring", as measured by the ability to walk, get in and out of bed, and get into and out of a chair; the broader definition (moving from one place to another while performing activities) is useful for people with different physical abilities who are still able to get around independently. Basic ADLs include the things many people do when they get up in the morning and get ready to go out of the house: get out of bed, go to the toilet, bathe, dress, groom, and eat. On the  average, loss of function typically follows a particular order. Hygiene is the first to go, followed by loss of toilet use and locomotion. The last to go is the ability to eat. When there is only one remaining area in which the person is independent, there is a 62.9%  chance that it is eating and only a 3.5% chance that it is hygiene. Instrumental Activities of Daily Living (IADL or IADLs) are not necessary for fundamental functioning, but they let an individual live independently in a community. IADL consist of tasks that include: cleaning and maintaining the house; home establishment and maintenance; care of others (including selecting and supervising caregivers); care of pets; child rearing; managing money; managing financials (investments, etc.); meal preparation and cleanup; shopping for groceries and necessities; moving within the community; safety procedures and emergency responses; health management and maintenance (taking prescribed medications); and using the telephone or other form of communication.  Instructions:  Most patients tend to report their pain as a combination of two factors, their physical pain and their psychosocial pain. This last one is also known as "suffering" and it is reflection of how physical pain affects you socially and psychologically. From now on, report them separately.  From this point on, when asked to report your pain level, report only your physical pain. Use the following table for reference.  Pain Clinic Pain Levels (0-5/10)  Pain Level Score  Description  No Pain 0   Mild pain 1 Nagging, annoying, but does not interfere with basic activities of daily living (ADL). Patients are able to eat, bathe, get dressed, toileting (being able to get on and off the toilet and perform personal hygiene functions), transfer (move in and out of bed or a chair without assistance), and maintain continence (able to control bladder and bowel functions). Blood pressure and heart rate are unaffected. A normal heart rate for a healthy adult ranges from 60 to 100 bpm (beats per minute).   Mild to moderate pain 2 Noticeable and distracting. Impossible to hide from other people. More frequent flare-ups. Still possible to adapt and function close to  normal. It can be very annoying and may have occasional stronger flare-ups. With discipline, patients may get used to it and adapt.   Moderate pain 3 Interferes significantly with activities of daily living (ADL). It becomes difficult to feed, bathe, get dressed, get on and off the toilet or to perform personal hygiene functions. Difficult to get in and out of bed or a chair without assistance. Very distracting. With effort, it can be ignored when deeply involved in activities.   Moderately severe pain 4 Impossible to ignore for more than a few minutes. With effort, patients may still be able to manage work or participate in some social activities. Very difficult to concentrate. Signs of autonomic nervous system discharge are evident: dilated pupils (mydriasis); mild sweating (diaphoresis); sleep interference. Heart rate becomes elevated (>115 bpm). Diastolic blood pressure (lower number) rises above 100 mmHg. Patients find relief in laying down and not moving.   Severe pain 5 Intense and extremely unpleasant. Associated with frowning face and frequent crying. Pain overwhelms the senses.  Ability to do any activity or maintain social relationships becomes significantly limited. Conversation becomes difficult. Pacing back and forth is common, as getting into a comfortable position is nearly impossible. Pain wakes you up from deep sleep. Physical signs will be obvious: pupillary dilation; increased sweating; goosebumps; brisk reflexes; cold, clammy hands and feet; nausea, vomiting or dry heaves; loss of appetite; significant  sleep disturbance with inability to fall asleep or to remain asleep. When persistent, significant weight loss is observed due to the complete loss of appetite and sleep deprivation.  Blood pressure and heart rate becomes significantly elevated. Caution: If elevated blood pressure triggers a pounding headache associated with blurred vision, then the patient should immediately seek attention  at an urgent or emergency care unit, as these may be signs of an impending stroke.    Emergency Department Pain Levels (6-10/10)  Emergency Room Pain 6 Severely limiting. Requires emergency care and should not be seen or managed at an outpatient pain management facility. Communication becomes difficult and requires great effort. Assistance to reach the emergency department may be required. Facial flushing and profuse sweating along with potentially dangerous increases in heart rate and blood pressure will be evident.   Distressing pain 7 Self-care is very difficult. Assistance is required to transport, or use restroom. Assistance to reach the emergency department will be required. Tasks requiring coordination, such as bathing and getting dressed become very difficult.   Disabling pain 8 Self-care is no longer possible. At this level, pain is disabling. The individual is unable to do even the most "basic" activities such as walking, eating, bathing, dressing, transferring to a bed, or toileting. Fine motor skills are lost. It is difficult to think clearly.   Incapacitating pain 9 Pain becomes incapacitating. Thought processing is no longer possible. Difficult to remember your own name. Control of movement and coordination are lost.   The worst pain imaginable 10 At this level, most patients pass out from pain. When this level is reached, collapse of the autonomic nervous system occurs, leading to a sudden drop in blood pressure and heart rate. This in turn results in a temporary and dramatic drop in blood flow to the brain, leading to a loss of consciousness. Fainting is one of the body's self defense mechanisms. Passing out puts the brain in a calmed state and causes it to shut down for a while, in order to begin the healing process.    Summary: 1.   Refer to this scale when providing us with your pain level. 2.   Be accurate and careful when reporting your pain level. This will help with your  care. 3.   Over-reporting your pain level will lead to loss of credibility. 4.   Even a level of 1/10 means that there is pain and will be treated at our facility. 5.   High, inaccurate reporting will be documented as "Symptom Exaggeration", leading to loss of credibility and suspicions of possible secondary gains such as obtaining more narcotics, or wanting to appear disabled, for fraudulent reasons. 6.   Only pain levels of 5 or below will be seen at our facility. 7.   Pain levels of 6 and above will be sent to the Emergency Department and the appointment cancelled.  ______________________________________________________________________________________________    ____________________________________________________________________________________________  Medication Rules  Purpose: To inform patients, and their family members, of our rules and regulations.  Applies to: All patients receiving prescriptions (written or electronic).  Pharmacy of record: Pharmacy where electronic prescriptions will be sent. If written prescriptions are taken to a different pharmacy, please inform the nursing staff. The pharmacy listed in the electronic medical record should be the one where you would like electronic prescriptions to be sent.  Electronic prescriptions: In compliance with the [facility] [place] Strengthen Opioid Misuse Prevention (STOP) Act of 2017 (Session [place] 78268936852017-74/H243), effective April 22, 2018, all controlled substances must be  electronically prescribed. Calling prescriptions to the pharmacy will cease to exist.  Prescription refills: Only during scheduled appointments. Applies to all prescriptions.  NOTE: The following applies primarily to controlled substances (Opioid* Pain Medications).   Type of encounter (visit): For patients receiving controlled substances, face-to-face visits are required. (Not an option or up to the patient.)  Patient's responsibilities: 1. Pain Pills: Bring all  pain pills to every appointment (except for procedure appointments). 2. Pill Bottles: Bring pills in original pharmacy bottle. Always bring the newest bottle. Bring bottle, even if empty. 3. Medication refills: You are responsible for knowing and keeping track of what medications you take and those you need refilled. The day before your appointment: write a list of all prescriptions that need to be refilled. The day of the appointment: give the list to the admitting nurse. Prescriptions will be written only during appointments. No prescriptions will be written on procedure days. If you forget a medication: it will not be "Called in", "Faxed", or "electronically sent". You will need to get another appointment to get these prescribed. No early refills. Do not call asking to have your prescription filled early. 4. Prescription Accuracy: You are responsible for carefully inspecting your prescriptions before leaving our office. Have the discharge nurse carefully go over each prescription with you, before taking them home. Make sure that your name is accurately spelled, that your address is correct. Check the name and dose of your medication to make sure it is accurate. Check the number of pills, and the written instructions to make sure they are clear and accurate. Make sure that you are given enough medication to last until your next medication refill appointment. 5. Taking Medication: Take medication as prescribed. When it comes to controlled substances, taking less pills or less frequently than prescribed is permitted and encouraged. Never take more pills than instructed. Never take medication more frequently than prescribed.  6. Inform other Doctors: Always inform, all of your healthcare providers, of all the medications you take. 7. Pain Medication from other Providers: You are not allowed to accept any additional pain medication from any other Doctor or Healthcare provider. There are two exceptions to  this rule. (see below) In the event that you require additional pain medication, you are responsible for notifying us, as stated below. 8. Cough Medicine: Often these contain an opioid, such as codeine or hydrocodone. Never accept or take cough medicine containing these opioids if you are already taking an opioid* medication. The combination may cause respiratory failure and death. 9. Medication Agreement: You are responsible for carefully reading and following our Medication Agreement. This must be signed before receiving any prescriptions from our practice. Safely store a copy of your signed Agreement. Violations to the Agreement will result in no further prescriptions. (Additional copies of our Medication Agreement are available upon request.) 10. Laws, Rules, & Regulations: All patients are expected to follow all Federal and [employer], Rules, [employer]. Ignorance of the Laws does not constitute a valid excuse.  11. Illegal drugs and Controlled Substances: The use of illegal substances (including, but not limited to marijuana and its derivatives) and/or the illegal use of any controlled substances is strictly prohibited. Violation of this rule may result in the immediate and permanent discontinuation of any and all prescriptions being written by our practice. The use of any illegal substances is prohibited. 12. Adopted CDC guidelines & recommendations: Target dosing levels will be at or below 60 MME/day. Use of benzodiazepines** is not recommended.  Exceptions: There are only two exceptions to the rule of not receiving pain medications from other Healthcare Providers. 1. Exception #1 (Emergencies): In the event of an emergency (i.e.: accident requiring emergency care), you are allowed to receive additional pain medication. However, you are responsible for: As soon as you are able, call our office [phone], at any time of the day or night, and leave a message stating your name, the  date and nature of the emergency, and the name and dose of the medication prescribed. In the event that your call is answered by a member of our staff, make sure to document and save the date, time, and the name of the person that took your information.  2. Exception #2 (Planned Surgery): In the event that you are scheduled by another doctor or dentist to have any type of surgery or procedure, you are allowed (for a period no longer than 30 days), to receive additional pain medication, for the acute post-op pain. However, in this case, you are responsible for picking up a copy of our "Post-op Pain Management for Surgeons" handout, and giving it to your surgeon or dentist. This document is available at our office, and does not require an appointment to obtain it. Simply go to our office during business hours (Monday-Thursday from 8:00 AM to 4:00 PM) (Friday 8:00 AM to 12:00 Noon) or if you have a scheduled appointment with [name], prior to your surgery, and ask for it by name. In addition, you are responsible for: calling our office [phone], at any time of the day or night, and leaving a message stating your name, name of your surgeon, type of surgery, and date of procedure or surgery. Failure to comply with your responsibilities may result in termination of therapy involving the controlled substances.  *Opioid medications include: morphine, codeine, oxycodone, oxymorphone, hydrocodone, hydromorphone, meperidine, tramadol, tapentadol, buprenorphine, fentanyl, methadone. **Benzodiazepine medications include: diazepam (Valium), alprazolam (Xanax), clonazepam (Klonopine), lorazepam (Ativan), clorazepate (Tranxene), chlordiazepoxide (Librium), estazolam (Prosom), oxazepam (Serax), temazepam (Restoril), triazolam (Halcion) (Last updated:  03/20/2020) ____________________________________________________________________________________________   ____________________________________________________________________________________________  Medication Recommendations and Reminders  Applies to: All patients receiving prescriptions (written and/or electronic).  Medication Rules & Regulations: These rules and regulations exist for your safety and that of others. They are not flexible and neither are we. Dismissing or ignoring them will be considered "non-compliance" with medication therapy, resulting in complete and irreversible termination of such therapy. (See document titled "Medication Rules" for more details.) In all conscience, because of safety reasons, we cannot continue providing a therapy where the patient does not follow instructions.  Pharmacy of record:   Definition: This is the pharmacy where your electronic prescriptions will be sent.   We do not endorse any particular pharmacy, however, we have experienced problems with [facility] not securing enough medication supply for the community.  We do not restrict you in your choice of pharmacy. However, once we write for your prescriptions, we will NOT be re-sending more prescriptions to fix restricted supply problems created by your pharmacy, or your insurance.   The pharmacy listed in the electronic medical record should be the one where you want electronic prescriptions to be sent.  If you choose to change pharmacy, simply notify our nursing staff.  Recommendations:  Keep all of your pain medications in a safe place, under lock and key, even if you live alone. We will NOT replace lost, stolen, or damaged medication.  After you fill your prescription, take 1 week's worth of pills and  put them away in a safe place. You should keep a separate, properly labeled bottle for this purpose. The remainder should be kept in the original bottle. Use this as your primary supply,  until it runs out. Once it's gone, then you know that you have 1 week's worth of medicine, and it is time to come in for a prescription refill. If you do this correctly, it is unlikely that you will ever run out of medicine.  To make sure that the above recommendation works, it is very important that you make sure your medication refill appointments are scheduled at least 1 week before you run out of medicine. To do this in an effective manner, make sure that you do not leave the office without scheduling your next medication management appointment. Always ask the nursing staff to show you in your prescription , when your medication will be running out. Then arrange for the receptionist to get you a return appointment, at least 7 days before you run out of medicine. Do not wait until you have 1 or 2 pills left, to come in. This is very poor planning and does not take into consideration that we may need to cancel appointments due to bad weather, sickness, or emergencies affecting our staff.  DO NOT ACCEPT A "Partial Fill": If for any reason your pharmacy does not have enough pills/tablets to completely fill or refill your prescription, do not allow for a "partial fill". The law allows the pharmacy to complete that prescription within 72 hours, without requiring a new prescription. If they do not fill the rest of your prescription within those 72 hours, you will need a separate prescription to fill the remaining amount, which we will NOT provide. If the reason for the partial fill is your insurance, you will need to talk to the pharmacist about payment alternatives for the remaining tablets, but again, DO NOT ACCEPT A PARTIAL FILL, unless you can trust your pharmacist to obtain the remainder of the pills within 72 hours.  Prescription refills and/or changes in medication(s):   Prescription refills, and/or changes in dose or medication, will be conducted only during scheduled medication management appointments.  (Applies to both, written and electronic prescriptions.)  No refills on procedure days. No medication will be changed or started on procedure days. No changes, adjustments, and/or refills will be conducted on a procedure day. Doing so will interfere with the diagnostic portion of the procedure.  No phone refills. No medications will be "called into the pharmacy".  No Fax refills.  No weekend refills.  No Holliday refills.  No after hours refills.  Remember:  Business hours are:  Monday to Thursday 8:00 AM to 4:00 PM Provider's Schedule: Milinda Pointer, MD - Appointments are:  Medication management: Monday and Wednesday 8:00 AM to 4:00 PM Procedure day: Tuesday and Thursday 7:30 AM to 4:00 PM Gillis Santa, MD - Appointments are:  Medication management: Tuesday and Thursday 8:00 AM to 4:00 PM Procedure day: Monday and Wednesday 7:30 AM to 4:00 PM (Last update: 11/10/2019) ____________________________________________________________________________________________   Preparing for Procedure with Sedation Instructions: . Oral Intake: Do not eat or drink anything for at least 8 hours prior to your procedure. . Transportation: Public transportation is not allowed. Bring an adult driver. The driver must be physically present in our waiting room before any procedure can be started. Marland Kitchen Physical Assistance: Bring an adult capable of physically assisting you, in the event you need help. . Blood Pressure Medicine: Take your blood pressure  medicine with a sip of water the morning of the procedure. . Insulin: Take only  of your normal insulin dose. . Preventing infections: Shower with an antibacterial soap the morning of your procedure. . Build-up your immune system: Take 1000 mg of Vitamin C with every meal (3 times a day) the day prior to your procedure. . Pregnancy: If you are pregnant, call and cancel the procedure. . Sickness: If you have a cold, fever, or any active infections, call  and cancel the procedure. . Arrival: You must be in the facility at least 30 minutes prior to your scheduled procedure. . Children: Do not bring children with you. . Dress appropriately: Bring dark clothing that you would not mind if they get stained. . Valuables: Do not bring any jewelry or valuables. Procedure appointments are reserved for interventional treatments only. Marland Kitchen No Prescription Refills. . No medication changes will be discussed during procedure appointments. . No disability issues will be discussed. Marland Kitchen

## 2020-08-24 NOTE — Progress Notes (Signed)
Nursing Pain Medication Assessment:  Safety precautions to be maintained throughout the outpatient stay will include: orient to surroundings, keep bed in low position, maintain call bell within reach at all times, provide assistance with transfer out of bed and ambulation.  Medication Inspection Compliance: Pill count conducted under aseptic conditions, in front of the patient. Neither the pills nor the bottle was removed from the patient's sight at any time. Once count was completed pills were immediately returned to the patient in their original bottle.  Medication: Oxycodone IR Pill/Patch Count: 52 of 65 pills remain Pill/Patch Appearance: Markings consistent with prescribed medication Bottle Appearance: Standard pharmacy container. Clearly labeled. Filled Date: 04 / 26 / 2022 Last Medication intake:  Today

## 2020-08-29 DIAGNOSIS — H353221 Exudative age-related macular degeneration, left eye, with active choroidal neovascularization: Secondary | ICD-10-CM | POA: Diagnosis not present

## 2020-09-04 ENCOUNTER — Ambulatory Visit: Payer: Medicare HMO | Admitting: Hematology and Oncology

## 2020-09-04 ENCOUNTER — Other Ambulatory Visit: Payer: Medicare HMO

## 2020-09-05 ENCOUNTER — Encounter: Payer: Self-pay | Admitting: Pain Medicine

## 2020-09-05 ENCOUNTER — Other Ambulatory Visit: Payer: Self-pay

## 2020-09-05 ENCOUNTER — Ambulatory Visit (HOSPITAL_BASED_OUTPATIENT_CLINIC_OR_DEPARTMENT_OTHER): Payer: Medicare HMO | Admitting: Pain Medicine

## 2020-09-05 ENCOUNTER — Ambulatory Visit
Admission: RE | Admit: 2020-09-05 | Discharge: 2020-09-05 | Disposition: A | Discharge status: Home / Self Care | Payer: Medicare HMO | Source: Ambulatory Visit | Attending: Pain Medicine | Admitting: Pain Medicine

## 2020-09-05 VITALS — BP 114/62 | HR 63 | Temp 96.8°F | Resp 16 | Ht 65.0 in | Wt 262.0 lb

## 2020-09-05 DIAGNOSIS — M48062 Spinal stenosis, lumbar region with neurogenic claudication: Secondary | ICD-10-CM | POA: Insufficient documentation

## 2020-09-05 DIAGNOSIS — R29898 Other symptoms and signs involving the musculoskeletal system: Secondary | ICD-10-CM | POA: Diagnosis not present

## 2020-09-05 DIAGNOSIS — Z7901 Long term (current) use of anticoagulants: Secondary | ICD-10-CM

## 2020-09-05 DIAGNOSIS — M5137 Other intervertebral disc degeneration, lumbosacral region: Secondary | ICD-10-CM | POA: Insufficient documentation

## 2020-09-05 DIAGNOSIS — M4316 Spondylolisthesis, lumbar region: Secondary | ICD-10-CM

## 2020-09-05 DIAGNOSIS — M5417 Radiculopathy, lumbosacral region: Secondary | ICD-10-CM | POA: Insufficient documentation

## 2020-09-05 DIAGNOSIS — M5410 Radiculopathy, site unspecified: Secondary | ICD-10-CM | POA: Diagnosis not present

## 2020-09-05 DIAGNOSIS — M79605 Pain in left leg: Secondary | ICD-10-CM | POA: Diagnosis present

## 2020-09-05 DIAGNOSIS — G8929 Other chronic pain: Secondary | ICD-10-CM

## 2020-09-05 DIAGNOSIS — M4310 Spondylolisthesis, site unspecified: Secondary | ICD-10-CM | POA: Insufficient documentation

## 2020-09-05 DIAGNOSIS — M5136 Other intervertebral disc degeneration, lumbar region: Secondary | ICD-10-CM | POA: Insufficient documentation

## 2020-09-05 DIAGNOSIS — M79604 Pain in right leg: Secondary | ICD-10-CM | POA: Diagnosis not present

## 2020-09-05 MED ORDER — DEXAMETHASONE SODIUM PHOSPHATE 10 MG/ML IJ SOLN
20.0000 mg | Freq: Once | INTRAMUSCULAR | Status: AC
  Administered 2020-09-05: 20 mg
  Filled 2020-09-05: qty 2

## 2020-09-05 MED ORDER — ROPIVACAINE HCL 2 MG/ML IJ SOLN
2.0000 mL | Freq: Once | INTRAMUSCULAR | Status: DC
  Filled 2020-09-05: qty 10

## 2020-09-05 MED ORDER — FENTANYL CITRATE (PF) 100 MCG/2ML IJ SOLN
25.0000 ug | INTRAMUSCULAR | Status: DC | PRN
Start: 2020-09-05 — End: 2020-09-05
  Administered 2020-09-05: 50 ug via INTRAVENOUS
  Filled 2020-09-05: qty 2

## 2020-09-05 MED ORDER — LIDOCAINE HCL 2 % IJ SOLN
20.0000 mL | Freq: Once | INTRAMUSCULAR | Status: AC
  Administered 2020-09-05: 400 mg
  Filled 2020-09-05: qty 20

## 2020-09-05 MED ORDER — LACTATED RINGERS IV SOLN
1000.0000 mL | Freq: Once | INTRAVENOUS | Status: AC
  Administered 2020-09-05: 1000 mL via INTRAVENOUS

## 2020-09-05 MED ORDER — SODIUM CHLORIDE 0.9% FLUSH
2.0000 mL | Freq: Once | INTRAVENOUS | Status: DC
Start: 2020-09-05 — End: 2020-09-05

## 2020-09-05 MED ORDER — DEXAMETHASONE SODIUM PHOSPHATE 10 MG/ML IJ SOLN
INTRAMUSCULAR | Status: AC
  Filled 2020-09-05: qty 1

## 2020-09-05 MED ORDER — MIDAZOLAM HCL 5 MG/5ML IJ SOLN
1.0000 mg | INTRAMUSCULAR | Status: DC | PRN
  Administered 2020-09-05: 1 mg via INTRAVENOUS
  Filled 2020-09-05: qty 5

## 2020-09-05 MED ORDER — IOHEXOL 180 MG/ML  SOLN
10.0000 mL | Freq: Once | INTRAMUSCULAR | Status: AC
  Administered 2020-09-05: 10 mL via INTRATHECAL

## 2020-09-05 NOTE — Progress Notes (Signed)
PROVIDER NOTE: Information contained herein reflects review and annotations entered in association with encounter. Interpretation of such information and data should be left to medically-trained personnel. Information provided to patient can be located elsewhere in the medical record under "Patient Instructions". Document created using STT-dictation technology, any transcriptional errors that may result from process are unintentional.    Patient: Jamie Phillips  Service Category: Procedure  Provider: Gaspar Cola, MD  DOB: 1940-07-28  DOS: 09/05/2020  Location: [facility]  MRN: [id number]  Setting: Ambulatory - outpatient  Referring Provider: Milinda Pointer, MD  Type: Established Patient  Specialty: Interventional Pain Management  PCP: Kirk Ruths, MD   Primary Reason for Visit: Interventional Pain Management Treatment. CC: Back Pain (Bilateral, lower)  Procedure:          Anesthesia, Analgesia, Anxiolysis:  Type: Trans-Foraminal Epidural Steroid Injection #2  Purpose: Diagnostic/Therapeutic Region: Posterolateral Lumbosacral Target Area: The 6 o'clock position under the pedicle, on the affected side. Approach: Posterior Percutaneous Paravertebral approach. Level: L3 Level Laterality: Bilateral Paravertebral  Type: Moderate (Conscious) Sedation combined with Local Anesthesia Indication(s): Analgesia and Anxiety Route: Intravenous (IV) IV Access: Secured Sedation: Meaningful verbal contact was maintained at all times during the procedure  Local Anesthetic: Lidocaine 1-2%  Position: Prone   Indications: 1. DDD (degenerative disc disease), lumbosacral   2. 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 Chronic lower extremity pain (1ry area of Pain) (Bilateral)   4. 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 Annular tear of lumbar disc (L3-4) (Left)   6. Lower extremity weakness (Bilateral)   7. 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 Retrolisthesis (L1/L2)   10. Chronic anticoagulation (Eliquis)    Pain Score: Pre-procedure: 10-Worst pain ever/10 Post-procedure: 0-No pain/10   Pre-op H&P Assessment:  Jamie Phillips is a 80 y.o. (year old), female patient, seen today for interventional treatment. She  has a past surgical history that includes neck fusion; Wrist surgery (Left, 2000); Cardiac catheterization (N/A, 08/15/2015); IVC FILTER INSERTION (N/A, 06/11/2016); Colonoscopy; Joint replacement (Left, 06/17/2016); Colonoscopy with propofol (N/A, 03/10/2017); IVC FILTER REMOVAL (N/A, 05/06/2017); laparoscopy (N/A, 10/10/2018); laparoscopic appendectomy (N/A, 10/10/2018); Partial colectomy (Right, 10/10/2018); Appendectomy; Esophagogastroduodenoscopy (egd) with propofol (N/A, 02/02/2019); and Breast biopsy (Left, 08/19/04). Jamie Phillips has a current medication list which includes the following prescription(s): acetaminophen, albuterol, cholestyramine, duloxetine, eliquis, ergocalciferol, escitalopram, gabapentin, losartan-hydrochlorothiazide, multiple vitamins-minerals, [START ON 09/13/2020] oxycodone, pravastatin, sucralfate, and pantoprazole, and the following Facility-Administered Medications: fentanyl, midazolam, ropivacaine (pf) 2 mg/ml (0.2%), and sodium chloride flush. Her primarily concern today is the Back Pain (Bilateral, lower)  Initial Vital Signs:  Pulse/HCG Rate: (!) 59ECG Heart Rate: 60 Temp: (!) 96.8 F (36 C) Resp: 18 BP: 127/83 SpO2: 96 %  BMI: Estimated body mass index is 43.6 kg/m as calculated from the following:   Height as of this encounter: 5\' 5"  (1.651 m).   Weight as of this encounter: 262 lb (118.8 kg).  Risk Assessment: Allergies: Reviewed. She has No Known Allergies.  Allergy Precautions: None required Coagulopathies: Reviewed. None identified.  Blood-thinner therapy: None at this time Active  Infection(s): Reviewed. None identified. Jamie Phillips is afebrile  Site Confirmation: Jamie Phillips was asked to confirm the procedure and laterality before marking the site Procedure checklist: Completed Consent: Before the procedure and under the influence of no sedative(s), amnesic(s), or anxiolytics, the patient was informed of the treatment options, risks  and possible complications. To fulfill our ethical and legal obligations, as recommended by the American Medical Association's Code of Ethics, I have informed the patient of my clinical impression; the nature and purpose of the treatment or procedure; the risks, benefits, and possible complications of the intervention; the alternatives, including doing nothing; the risk(s) and benefit(s) of the alternative treatment(s) or procedure(s); and the risk(s) and benefit(s) of doing nothing. The patient was provided information about the general risks and possible complications associated with the procedure. These may include, but are not limited to: failure to achieve desired goals, infection, bleeding, organ or nerve damage, allergic reactions, paralysis, and death. In addition, the patient was informed of those risks and complications associated to Spine-related procedures, such as failure to decrease pain; infection (i.e.: Meningitis, epidural or intraspinal abscess); bleeding (i.e.: epidural hematoma, subarachnoid hemorrhage, or any other type of intraspinal or peri-dural bleeding); organ or nerve damage (i.e.: Any type of peripheral nerve, nerve root, or spinal cord injury) with subsequent damage to sensory, motor, and/or autonomic systems, resulting in permanent pain, numbness, and/or weakness of one or several areas of the body; allergic reactions; (i.e.: anaphylactic reaction); and/or death. Furthermore, the patient was informed of those risks and complications associated with the medications. These include, but are not limited to: allergic reactions  (i.e.: anaphylactic or anaphylactoid reaction(s)); adrenal axis suppression; blood sugar elevation that in diabetics may result in ketoacidosis or comma; water retention that in patients with history of congestive heart failure may result in shortness of breath, pulmonary edema, and decompensation with resultant heart failure; weight gain; swelling or edema; medication-induced neural toxicity; particulate matter embolism and blood vessel occlusion with resultant organ, and/or nervous system infarction; and/or aseptic necrosis of one or more joints. Finally, the patient was informed that Medicine is not an exact science; therefore, there is also the possibility of unforeseen or unpredictable risks and/or possible complications that may result in a catastrophic outcome. The patient indicated having understood very clearly. We have given the patient no guarantees and we have made no promises. Enough time was given to the patient to ask questions, all of which were answered to the patient's satisfaction. Ms. Hanke has indicated that she wanted to continue with the procedure. Attestation: I, the ordering provider, attest that I have discussed with the patient the benefits, risks, side-effects, alternatives, likelihood of achieving goals, and potential problems during recovery for the procedure that I have provided informed consent. Date  Time: 09/05/2020  9:04 AM  Pre-Procedure Preparation:  Monitoring: As per clinic protocol. Respiration, ETCO2, SpO2, BP, heart rate and rhythm monitor placed and checked for adequate function Safety Precautions: Patient was assessed for positional comfort and pressure points before starting the procedure. Time-out: I initiated and conducted the "Time-out" before starting the procedure, as per protocol. The patient was asked to participate by confirming the accuracy of the "Time Out" information. Verification of the correct person, site, and procedure were performed and  confirmed by me, the nursing staff, and the patient. "Time-out" conducted as per Joint Commission's Universal Protocol (UP.01.01.01). Time: 0932  Description of Procedure:          Area Prepped: Entire Posterior Lumbosacral Area DuraPrep (Iodine Povacrylex [0.7% available iodine] and Isopropyl Alcohol, 74% w/w) Safety Precautions: Aspiration looking for blood return was conducted prior to all injections. At no point did we inject any substances, as a needle was being advanced. No attempts were made at seeking any paresthesias. Safe injection practices and needle disposal techniques used. Medications  properly checked for expiration dates. SDV (single dose vial) medications used. Description of the Procedure: Protocol guidelines were followed. The patient was placed in position over the procedure table. The target area was identified and the area prepped in the usual manner. Skin & deeper tissues infiltrated with local anesthetic. Appropriate amount of time allowed to pass for local anesthetics to take effect. The procedure needles were then advanced to the target area. Proper needle placement secured. Negative aspiration confirmed. Solution injected in intermittent fashion, asking for systemic symptoms every 0.5cc of injectate. The needles were then removed and the area cleansed, making sure to leave some of the prepping solution back to take advantage of its long term bactericidal properties.  Vitals:   09/05/20 0940 09/05/20 0950 09/05/20 1000 09/05/20 1010  BP: 119/84 122/63 123/69 114/62  Pulse: 63     Resp: 17 18 18 16   Temp:      TempSrc:      SpO2: 100% 99% 100% 100%  Weight:      Height:        Start Time: 0932 hrs. End Time: 0940 hrs.  Materials:  Needle(s) Type: Spinal Needle Gauge: 22G Length: 5-in Medication(s): Please see orders for medications and dosing details.  Imaging Guidance (Spinal):          Type of Imaging Technique: Fluoroscopy Guidance (Spinal) Indication(s):  Assistance in needle guidance and placement for procedures requiring needle placement in or near specific anatomical locations not easily accessible without such assistance. Exposure Time: Please see nurses notes. Contrast: Before injecting any contrast, we confirmed that the patient did not have an allergy to iodine, shellfish, or radiological contrast. Once satisfactory needle placement was completed at the desired level, radiological contrast was injected. Contrast injected under live fluoroscopy. No contrast complications. See chart for type and volume of contrast used. Fluoroscopic Guidance: I was personally present during the use of fluoroscopy. "Tunnel Vision Technique" used to obtain the best possible view of the target area. Parallax error corrected before commencing the procedure. "Direction-depth-direction" technique used to introduce the needle under continuous pulsed fluoroscopy. Once target was reached, antero-posterior, oblique, and lateral fluoroscopic projection used confirm needle placement in all planes. Images permanently stored in EMR. Interpretation: I personally interpreted the imaging intraoperatively. Adequate needle placement confirmed in multiple planes. Appropriate spread of contrast into desired area was observed. No evidence of afferent or efferent intravascular uptake. No intrathecal or subarachnoid spread observed. Permanent images saved into the patient's record.  Antibiotic Prophylaxis:   Anti-infectives (From admission, onward)   None     Indication(s): None identified  Post-operative Assessment:  Post-procedure Vital Signs:  Pulse/HCG Rate: 6368 Temp: (!) 96.8 F (36 C) Resp: 16 BP: 114/62 SpO2: 100 %  EBL: None  Complications: No immediate post-treatment complications observed by team, or reported by patient.  Note: The patient tolerated the entire procedure well. A repeat set of vitals were taken after the procedure and the patient was kept under  observation following institutional policy, for this type of procedure. Post-procedural neurological assessment was performed, showing return to baseline, prior to discharge. The patient was provided with post-procedure discharge instructions, including a section on how to identify potential problems. Should any problems arise concerning this procedure, the patient was given instructions to immediately contact us, at any time, without hesitation. In any case, we plan to contact the patient by telephone for a follow-up status report regarding this interventional procedure.  Comments:  No additional relevant information.  Plan of Care  Orders:  Orders Placed This Encounter  Procedures  . Lumbar Transforaminal Epidural    Scheduling Instructions:     Side: Bilateral     Level: L3     Sedation: Patient's choice.     Timeframe: Today    Order Specific Question:   Where will this procedure be performed?    Answer:   [facility]  . [facility] C-ARM 1-60 MIN NO REPORT    Intraoperative interpretation by procedural physician at [facility].    Standing Status:   Standing    Number of Occurrences:   1    Order Specific Question:   Reason for exam:    Answer:   Assistance in needle guidance and placement for procedures requiring needle placement in or near specific anatomical locations not easily accessible without such assistance.  . Informed Consent Details: Physician/Practitioner Attestation; Transcribe to consent form and obtain patient signature    Provider Attestation: I, [place] Dossie Arbour, MD, (Pain Management Specialist), the physician/practitioner, attest that I have discussed with the patient the benefits, risks, side effects, alternatives, likelihood of achieving goals and potential problems during recovery for the procedure that I have provided informed consent.    Scheduling Instructions:     Note: Always confirm laterality of pain with Ms. Hardin Negus, before  procedure.     Transcribe to consent form and obtain patient signature.    Order Specific Question:   Physician/Practitioner attestation of informed consent for procedure/surgical case    Answer:   I, the physician/practitioner, attest that I have discussed with the patient the benefits, risks, side effects, alternatives, likelihood of achieving goals and potential problems during recovery for the procedure that I have provided informed consent.    Order Specific Question:   Procedure    Answer:   Diagnostic lumbar transforaminal epidural steroid injection under fluoroscopic guidance. (See notes for level and laterality.)    Order Specific Question:   Physician/Practitioner performing the procedure    Answer:   Geordie Nooney A. Dossie Arbour, MD    Order Specific Question:   Indication/Reason    Answer:   Lumbar radiculopathy/radiculitis associated with lumbar stenosis  . Care order/instruction: Please confirm that the patient has stopped the Eliquis (Apixaban) x 3 days prior to procedure or surgery.    Please confirm that the patient has stopped the Eliquis (Apixaban) x 3 days prior to procedure or surgery.    Standing Status:   Standing    Number of Occurrences:   1  . Provide equipment / supplies at bedside    "Block Tray" (Disposable  single use) Needle type: SpinalSpinal Amount/quantity: 2 Size: Medium (5-inch) Gauge: 22G    Standing Status:   Standing    Number of Occurrences:   1    Order Specific Question:   Specify    Answer:   Block Tray  . Bleeding precautions    Standing Status:   Standing    Number of Occurrences:   1   Chronic Opioid Analgesic:  Tramadol 50 mg, 1 tab p.o. 4 times daily (200 mg/day of tramadol) MME/day: 20 mg/day   Medications ordered for procedure: Meds ordered this encounter  Medications  . iohexol (OMNIPAQUE) 180 MG/ML injection 10 mL    Must be Myelogram-compatible. If not available, you may substitute with a water-soluble, non-ionic, hypoallergenic,  myelogram-compatible radiological contrast medium.  Marland Kitchen lidocaine (XYLOCAINE) 2 % (with pres) injection 400 mg  . lactated ringers infusion 1,000 mL  . midazolam (VERSED) 5  MG/5ML injection 1-2 mg    Make sure Flumazenil is available in the pyxis when using this medication. If oversedation occurs, administer 0.2 mg IV over 15 sec. If after 45 sec no response, administer 0.2 mg again over 1 min; may repeat at 1 min intervals; not to exceed 4 doses (1 mg)  . fentaNYL (SUBLIMAZE) injection 25-50 mcg    Make sure Narcan is available in the pyxis when using this medication. In the event of respiratory depression (RR< 8/min): Titrate NARCAN (naloxone) in increments of 0.1 to 0.2 mg IV at 2-3 minute intervals, until desired degree of reversal.  . sodium chloride flush (NS) 0.9 % injection 2 mL  . ropivacaine (PF) 2 mg/mL (0.2%) (NAROPIN) injection 2 mL  . dexamethasone (DECADRON) injection 20 mg   Medications administered: We administered iohexol, lidocaine, lactated ringers, midazolam, fentaNYL, and dexamethasone.  See the medical record for exact dosing, route, and time of administration.  Follow-up plan:   Return in about 2 weeks (around 09/19/2020) for afternoon (VV) procedure day (PPE).       Interventional Therapies  Risk  Complexity Considerations:   Estimated body mass index is 43.6 kg/m as calculated from the following:   Height as of this encounter: 5\' 5"  (1.651 m).   Weight as of this encounter: 262 lb (118.8 kg). NOTE: Eliquis Anticoagulation: (Stop: 3 days  Restart: 6 hrs)   Planned  Pending:      Under consideration:   Diagnostic midline T12-L1 LESI    Completed:   Diagnostic bilateral lumbar facet block x1 (08/15/2020)  Diagnostic bilateral L3 TFESI x1 (07/11/2020) (100/100/100/100% relief of the bilateral L1/L2 radiculitis)  Diagnostic right L2-3 LESI x1 (07/11/2020) (100/100/100/100% relief of the bilateral L1/L2 radiculitis)    Therapeutic  Palliative (PRN) options:    Palliative/therapeutic bilateral L3 TFESI #2 + right L2-3 LESI #2     Recent Visits Date Type Provider Dept  08/24/20 Office Visit Milinda Pointer, MD [facility]  08/15/20 Procedure visit Milinda Pointer, MD [facility]  07/27/20 Office Visit Milinda Pointer, MD [facility]  07/11/20 Procedure visit Milinda Pointer, MD [facility]  06/21/20 Office Visit Milinda Pointer, MD [facility]  Showing recent visits within past 90 days and meeting all other requirements Today's Visits Date Type Provider Dept  09/05/20 Procedure visit Milinda Pointer, MD [facility]  Showing today's visits and meeting all other requirements Future Appointments Date Type Provider Dept  09/21/20 Appointment Milinda Pointer, MD [facility]  Showing future appointments within next 90 days and meeting all other requirements  Disposition: Discharge home  Discharge (Date  Time): 09/05/2020;   hrs.   Primary Care Physician: Kirk Ruths, MD Location: [facility] Note by: Gaspar Cola, MD Date: 09/05/2020; Time: 10:52 AM  Disclaimer:  Medicine is not an Chief Strategy Officer. The only guarantee in medicine is that nothing is guaranteed. It is important to note that the decision to proceed with this intervention was based on the information collected from the patient. The Data and conclusions were drawn from the patient's questionnaire, the interview, and the physical examination. Because the information was provided in large part by the patient, it cannot be guaranteed that it has not been purposely or unconsciously manipulated. Every effort has been made to obtain as much relevant data as possible for this evaluation. It is important to note that the conclusions that lead to this procedure are derived in large part from the  available data. Always take into account that the treatment will also be  dependent on availability of resources and existing treatment guidelines, considered by other Pain Management Practitioners as being common knowledge and practice, at the time of the intervention. For Medico-Legal purposes, it is also important to point out that variation in procedural techniques and pharmacological choices are the acceptable norm. The indications, contraindications, technique, and results of the above procedure should only be interpreted and judged by a Board-Certified Interventional Pain Specialist with extensive familiarity and expertise in the same exact procedure and technique.

## 2020-09-05 NOTE — Patient Instructions (Addendum)
____________________________________________________________________________________________  Post-Procedure Discharge Instructions  Instructions:  Apply ice:   Purpose: This will minimize any swelling and discomfort after procedure.   When: Day of procedure, as soon as you get home.  How: Fill a plastic sandwich bag with crushed ice. Cover it with a small towel and apply to injection site.  How long: (15 min on, 15 min off) Apply for 15 minutes then remove x 15 minutes.  Repeat sequence on day of procedure, until you go to bed.  Apply heat:   Purpose: To treat any soreness and discomfort from the procedure.  When: Starting the next day after the procedure.  How: Apply heat to procedure site starting the day following the procedure.  How long: May continue to repeat daily, until discomfort goes away.  Food intake: Start with clear liquids (like water) and advance to regular food, as tolerated.   Physical activities: Keep activities to a minimum for the first 8 hours after the procedure. After that, then as tolerated.  Driving: If you have received any sedation, be responsible and do not drive. You are not allowed to drive for 24 hours after having sedation.  Blood thinner: (Applies only to those taking blood thinners) You may restart your blood thinner 6 hours after your procedure.  Insulin: (Applies only to Diabetic patients taking insulin) As soon as you can eat, you may resume your normal dosing schedule.  Infection prevention: Keep procedure site clean and dry. Shower daily and clean area with soap and water.  Post-procedure Pain Diary: Extremely important that this be done correctly and accurately. Recorded information will be used to determine the next step in treatment. For the purpose of accuracy, follow these rules:  Evaluate only the area treated. Do not report or include pain from an untreated area. For the purpose of this evaluation, ignore all other areas of pain,  except for the treated area.  After your procedure, avoid taking a long nap and attempting to complete the pain diary after you wake up. Instead, set your alarm clock to go off every hour, on the hour, for the initial 8 hours after the procedure. Document the duration of the numbing medicine, and the relief you are getting from it.  Do not go to sleep and attempt to complete it later. It will not be accurate. If you received sedation, it is likely that you were given a medication that may cause amnesia. Because of this, completing the diary at a later time may cause the information to be inaccurate. This information is needed to plan your care.  Follow-up appointment: Keep your post-procedure follow-up evaluation appointment after the procedure (usually 2 weeks for most procedures, 6 weeks for radiofrequencies). DO NOT FORGET to bring you pain diary with you.   Expect: (What should I expect to see with my procedure?)  From numbing medicine (AKA: Local Anesthetics): Numbness or decrease in pain. You may also experience some weakness, which if present, could last for the duration of the local anesthetic.  Onset: Full effect within 15 minutes of injected.  Duration: It will depend on the type of local anesthetic used. On the average, 1 to 8 hours.   From steroids (Applies only if steroids were used): Decrease in swelling or inflammation. Once inflammation is improved, relief of the pain will follow.  Onset of benefits: Depends on the amount of swelling present. The more swelling, the longer it will take for the benefits to be seen. In some cases, up to 10 days.    Duration: Steroids will stay in the system x 2 weeks. Duration of benefits will depend on multiple posibilities including persistent irritating factors.  Side-effects: If present, they may typically last 2 weeks (the duration of the steroids).  Frequent: Cramps (if they occur, drink Gatorade and take over-the-counter Magnesium 450-500 mg  once to twice a day); water retention with temporary weight gain; increases in blood sugar; decreased immune system response; increased appetite.  Occasional: Facial flushing (red, warm cheeks); mood swings; menstrual changes.  Uncommon: Long-term decrease or suppression of natural hormones; bone thinning. (These are more common with higher doses or more frequent use. This is why we prefer that our patients avoid having any injection therapies in other practices.)   Very Rare: Severe mood changes; psychosis; aseptic necrosis.  From procedure: Some discomfort is to be expected once the numbing medicine wears off. This should be minimal if ice and heat are applied as instructed.  Call if: (When should I call?)  You experience numbness and weakness that gets worse with time, as opposed to wearing off.  New onset bowel or bladder incontinence. (Applies only to procedures done in the spine)  Emergency Numbers:  Durning business hours (Monday - Thursday, 8:00 AM - 4:00 PM) (Friday, 9:00 AM - 12:00 Noon): [phone]  After hours: [phone]  NOTE: If you are having a problem and are unable connect with, or to talk to a provider, then go to your nearest urgent care or emergency department. If the problem is serious and urgent, please call 911. ____________________________________________________________________________________________   ____________________________________________________________________________________________  Virtual Visits   What is a "Virtual Visit"? It is a Metallurgist (medical visit) that takes place on real time (NOT TEXT or E-MAIL) over the telephone or computer device (desktop, laptop, tablet, smart phone, etc.). It allows for more location flexibility between the patient and the healthcare provider.  Who decides when these types of visits will be used? The physician.  Who is eligible for these types of visits? Only those patients  that can be reliably reached over the telephone.  What do you mean by reliably? We do not have time to call everyone multiple times, therefore those that tend to screen calls and then call back later are not suitable candidates for this system. We understand how people are reluctant to pickup on "unknown" calls, therefore, we suggest adding our telephone numbers to your list of "CONTACT(s)". This way, you should be able to readily identify our calls when you receive one. All of our numbers are available below.   Who is not eligible? This option is not available for medication management encounters, specially for controlled substances. Patients on pain medications that fall under the category of controlled substances have to come in for "Face-to-Face" encounters. This is required for mandatory monitoring of these substances. You may be asked to provide a sample for an unannounced urine drug screening test (UDS), and we will need to count your pain pills. Not bringing your pills to be counted may result in no refill. Obviously, neither one of these can be done over the phone.  When will this type of visits be used? You can request a virtual visit whenever you are physically unable to attend a regular appointment. The decision will be made by the physician (or healthcare provider) on a case by case basis.   At what time will I be called? This is an excellent question. The providers will try to call you whenever they have time available. Do  not expect to be called at any specific time. The secretaries will assign you a time for your virtual visit appointment, but this is done simply to keep a list of those patients that need to be called, but not for the purpose of keeping a time schedule. Be advised that the call may come in anytime during the day, between the hours of 8:00 AM and 8::00 PM, depending on provider availability. We do understand that the system is not perfect. If you are unable to be available  that day on a moments notice, then request an "in-person" appointment rather than a "virtual visit".  Can I request my medication visits to be "Virtual"? Yes you may request it, but the decision is entirely up to the healthcare provider. Control substances require specific monitoring that requires Face-to-Face encounters. The number of encounters  and the extent of the monitoring is determined on a case by case basis.  Add a new contact to your smart phone and label it "PAIN CLINIC" Under this contact add the following numbers: Main: [phone] (Official Contact Number) Nurses: [phone] (These are outgoing only calling systems. Do not call this number.) Dr. Dossie Arbour: [phone] or [phone] (Outgoing calls only. Do not call this number.)  ____________________________________________________________________________________________   Selective Nerve Root Block Patient Information  Description: Specific nerve roots exit the spinal canal and these nerves can be compressed and inflamed by a bulging disc and bone spurs.  By injecting steroids on the nerve root, we can potentially decrease the inflammation surrounding these nerves, which often leads to decreased pain.  Also, by injecting local anesthesia on the nerve root, this can provide [name] helpful information to give to your referring doctor if it decreases your pain.  Selective nerve root blocks can be done along the spine from the neck to the low back depending on the location of your pain.   After numbing the skin with local anesthesia, a small needle is passed to the nerve root and the position of the needle is verified using x-ray pictures.  After the needle is in correct position, we then deposit the medication.  You may experience a pressure sensation while this is being done.  The entire block usually lasts less than 15 minutes.  Conditions that may be treated with selective nerve root blocks:  Low back and leg  pain  Spinal stenosis  Diagnostic block prior to potential surgery  Neck and arm pain  Post laminectomy syndrome  Preparation for the injection:  1. Do not eat any solid food or dairy products within 8 hours of your appointment. 2. You may drink clear liquids up to 3 hours before an appointment.  Clear liquids include water, black coffee, juice or soda.  No milk or cream please. 3. You may take your regular medications, including pain medications, with a sip of water before your appointment.  Diabetics should hold regular insulin (if taken separately) and take 1/2 normal NPH dose the morning of the procedure.  Carry some sugar containing items with you to your appointment. 4. A driver must accompany you and be prepared to drive you home after your procedure. 5. Bring all your current medications with you. 6. An IV may be inserted and sedation may be given at the discretion of the physician. 7. A blood pressure cuff, EKG, and other monitors will often be applied during the procedure.  Some patients may need to have extra oxygen administered for a short period. 8. You will be  asked to provide medical information, including allergies, prior to the procedure.  We must know immediately if you are taking blood  Thinners (like Coumadin) or if you are allergic to IV iodine contrast (dye).  Possible side-effects: All are usually temporary  Bleeding from needle site  Light headedness  Numbness and tingling  Decreased blood pressure  Weakness in arms/legs  Pressure sensation in back/neck  Pain at injection site (several days)  Possible complications: All are extremely rare  Infection  Nerve injury  Spinal headache (a headache wore with upright position)  Call if you experience:  Fever/chills associated with headache or increased back/neck pain  Headache worsened by an upright position  New onset weakness or numbness of an extremity below the injection site  Hives or  difficulty breathing (go to the emergency room)  Inflammation or drainage at the injection site(s)  Severe back/neck pain greater than usual  New symptoms which are concerning to you  Please note:  Although the local anesthetic injected can often make your back or neck feel good for several hours after the injection the pain will likely return.  It takes 3-5 days for steroids to work on the nerve root. You may not notice any pain relief for at least one week.  If effective, we will often do a series of 3 injections spaced 3-6 weeks apart to maximally decrease your pain.    If you have any questions, please call [phone] [facility] Discharge Inst[facility]  General Discharge Instructions :  If you need to reach your doctor call: Monday-Friday 8:00 am - 4:00 pm at [phone] or toll free [phone].  After clinic hours [phone] to have operator reach doctor.  Bring all of your medication bottles to all your appointments in the pain clinic.  To cancel or reschedule your appointment with Pain Management please remember to call 24 hours in advance to avoid a fee.  Refer to the educational materials which you have been given on: General Risks, I had my Procedure. Discharge Instructions, Post Sedation.  Post Procedure Instructions:  The drugs you were given will stay in your system until tomorrow, so for the next 24 hours you should not drive, make any legal decisions or drink any alcoholic beverages.  You may eat anything you prefer, but it is better to start with liquids then soups and crackers, and gradually work up to solid foods.  Please notify your doctor immediately if you have any unusual bleeding, trouble breathing or pain that is not related to your normal pain.  Depending on the type of procedure that was done, some parts of your body may feel week and/or numb.  This usually clears up by tonight or the next  day.  Walk with the use of an assistive device or accompanied by an adult for the 24 hours.  You may use ice on the affected area for the first 24 hours.  Put ice in a Ziploc bag and cover with a towel and place against area 15 minutes on 15 minutes off.  You may switch to heat after 24 hours.

## 2020-09-06 ENCOUNTER — Telehealth: Payer: Self-pay

## 2020-09-06 NOTE — Telephone Encounter (Signed)
Post procedure phone call.  LM 

## 2020-09-07 ENCOUNTER — Encounter: Payer: Self-pay | Admitting: Pain Medicine

## 2020-09-07 DIAGNOSIS — R94131 Abnormal electromyogram [EMG]: Secondary | ICD-10-CM | POA: Insufficient documentation

## 2020-09-11 ENCOUNTER — Telehealth: Payer: Self-pay | Admitting: Pain Medicine

## 2020-09-11 NOTE — Telephone Encounter (Signed)
Patient called stating she is hurting real bad from her back down her legs to her knees since Thurs. She just had procedure on 09-05-20. Please call patient to advise what she can do. I notified her Dr. Dossie Arbour is out of the office

## 2020-09-11 NOTE — Telephone Encounter (Signed)
Spoke with patient.  She states that the pain went away for the first day and then returned.  States it is the same pain as prior to the procedure.  Instructed her that the numbing medicine had worn off and now she had to give it a few days for the steroid to start working.  Denies fever or weakness.  Instructed patient to call us back for any further questions or concerns.

## 2020-09-12 ENCOUNTER — Telehealth: Payer: Self-pay | Admitting: Pain Medicine

## 2020-09-12 DIAGNOSIS — F325 Major depressive disorder, single episode, in full remission: Secondary | ICD-10-CM | POA: Diagnosis not present

## 2020-09-12 DIAGNOSIS — N1831 Chronic kidney disease, stage 3a: Secondary | ICD-10-CM | POA: Diagnosis not present

## 2020-09-12 DIAGNOSIS — Z6841 Body Mass Index (BMI) 40.0 and over, adult: Secondary | ICD-10-CM | POA: Diagnosis not present

## 2020-09-12 DIAGNOSIS — I2692 Saddle embolus of pulmonary artery without acute cor pulmonale: Secondary | ICD-10-CM | POA: Diagnosis not present

## 2020-09-12 DIAGNOSIS — I2782 Chronic pulmonary embolism: Secondary | ICD-10-CM | POA: Diagnosis not present

## 2020-09-12 DIAGNOSIS — I129 Hypertensive chronic kidney disease with stage 1 through stage 4 chronic kidney disease, or unspecified chronic kidney disease: Secondary | ICD-10-CM | POA: Diagnosis not present

## 2020-09-12 DIAGNOSIS — E7800 Pure hypercholesterolemia, unspecified: Secondary | ICD-10-CM | POA: Diagnosis not present

## 2020-09-12 DIAGNOSIS — C9110 Chronic lymphocytic leukemia of B-cell type not having achieved remission: Secondary | ICD-10-CM | POA: Diagnosis not present

## 2020-09-12 NOTE — Telephone Encounter (Signed)
Spoke with patient and she states she is still having pain.  States it is the same pain as before procedure.  Denies fever, progressive weakness.  States she went to her PCP today and he increased her gabapentin.  Instructed patient that she is on day 7 and she needed to give it a few more days but to go to ED if she felt necessary.  Patient states understanding.

## 2020-09-12 NOTE — Telephone Encounter (Signed)
Patient states her pain is increasing and is worse than when she had the procedure. Would like to know what she can do to alleviate some of this pain.

## 2020-09-14 ENCOUNTER — Other Ambulatory Visit: Payer: Medicare HMO

## 2020-09-14 ENCOUNTER — Ambulatory Visit: Payer: Medicare HMO | Admitting: Oncology

## 2020-09-14 ENCOUNTER — Other Ambulatory Visit: Payer: Self-pay

## 2020-09-14 DIAGNOSIS — C9110 Chronic lymphocytic leukemia of B-cell type not having achieved remission: Secondary | ICD-10-CM

## 2020-09-15 ENCOUNTER — Inpatient Hospital Stay (HOSPITAL_BASED_OUTPATIENT_CLINIC_OR_DEPARTMENT_OTHER): Payer: Medicare HMO | Admitting: Internal Medicine

## 2020-09-15 ENCOUNTER — Encounter: Payer: Self-pay | Admitting: Internal Medicine

## 2020-09-15 ENCOUNTER — Inpatient Hospital Stay: Payer: Medicare HMO | Attending: Internal Medicine

## 2020-09-15 DIAGNOSIS — Z7901 Long term (current) use of anticoagulants: Secondary | ICD-10-CM | POA: Insufficient documentation

## 2020-09-15 DIAGNOSIS — I10 Essential (primary) hypertension: Secondary | ICD-10-CM | POA: Insufficient documentation

## 2020-09-15 DIAGNOSIS — K219 Gastro-esophageal reflux disease without esophagitis: Secondary | ICD-10-CM | POA: Diagnosis not present

## 2020-09-15 DIAGNOSIS — Z86718 Personal history of other venous thrombosis and embolism: Secondary | ICD-10-CM | POA: Insufficient documentation

## 2020-09-15 DIAGNOSIS — Z86711 Personal history of pulmonary embolism: Secondary | ICD-10-CM | POA: Diagnosis not present

## 2020-09-15 DIAGNOSIS — I739 Peripheral vascular disease, unspecified: Secondary | ICD-10-CM | POA: Insufficient documentation

## 2020-09-15 DIAGNOSIS — C9111 Chronic lymphocytic leukemia of B-cell type in remission: Secondary | ICD-10-CM | POA: Insufficient documentation

## 2020-09-15 DIAGNOSIS — M549 Dorsalgia, unspecified: Secondary | ICD-10-CM | POA: Diagnosis not present

## 2020-09-15 DIAGNOSIS — G8929 Other chronic pain: Secondary | ICD-10-CM | POA: Diagnosis not present

## 2020-09-15 DIAGNOSIS — C9110 Chronic lymphocytic leukemia of B-cell type not having achieved remission: Secondary | ICD-10-CM

## 2020-09-15 DIAGNOSIS — Z79899 Other long term (current) drug therapy: Secondary | ICD-10-CM | POA: Insufficient documentation

## 2020-09-15 DIAGNOSIS — E7800 Pure hypercholesterolemia, unspecified: Secondary | ICD-10-CM | POA: Diagnosis not present

## 2020-09-15 DIAGNOSIS — E669 Obesity, unspecified: Secondary | ICD-10-CM | POA: Diagnosis not present

## 2020-09-15 DIAGNOSIS — E785 Hyperlipidemia, unspecified: Secondary | ICD-10-CM | POA: Diagnosis not present

## 2020-09-15 LAB — COMPREHENSIVE METABOLIC PANEL
ALT: 16 U/L (ref 0–44)
AST: 16 U/L (ref 15–41)
Albumin: 3.5 g/dL (ref 3.5–5.0)
Alkaline Phosphatase: 83 U/L (ref 38–126)
Anion gap: 6 (ref 5–15)
BUN: 19 mg/dL (ref 8–23)
CO2: 32 mmol/L (ref 22–32)
Calcium: 8.7 mg/dL — ABNORMAL LOW (ref 8.9–10.3)
Chloride: 104 mmol/L (ref 98–111)
Creatinine, Ser: 0.98 mg/dL (ref 0.44–1.00)
GFR, Estimated: 59 mL/min — ABNORMAL LOW (ref >60)
Glucose, Bld: 153 mg/dL — ABNORMAL HIGH (ref 70–99)
Potassium: 3.7 mmol/L (ref 3.5–5.1)
Sodium: 142 mmol/L (ref 135–145)
Total Bilirubin: 0.4 mg/dL (ref 0.3–1.2)
Total Protein: 5.9 g/dL — ABNORMAL LOW (ref 6.5–8.1)

## 2020-09-15 LAB — CBC WITH DIFFERENTIAL/PLATELET
Abs Immature Granulocytes: 0.03 10*3/uL (ref 0.00–0.07)
Basophils Absolute: 0.1 10*3/uL (ref 0.0–0.1)
Basophils Relative: 1 %
Eosinophils Absolute: 0.3 10*3/uL (ref 0.0–0.5)
Eosinophils Relative: 2 %
HCT: 40.3 % (ref 36.0–46.0)
Hemoglobin: 13.2 g/dL (ref 12.0–15.0)
Immature Granulocytes: 0 %
Lymphocytes Relative: 57 %
Lymphs Abs: 7.4 10*3/uL — ABNORMAL HIGH (ref 0.7–4.0)
MCH: 32 pg (ref 26.0–34.0)
MCHC: 32.8 g/dL (ref 30.0–36.0)
MCV: 97.6 fL (ref 80.0–100.0)
Monocytes Absolute: 0.7 10*3/uL (ref 0.1–1.0)
Monocytes Relative: 6 %
Neutro Abs: 4.4 10*3/uL (ref 1.7–7.7)
Neutrophils Relative %: 34 %
Platelets: 248 10*3/uL (ref 150–400)
RBC Morphology: NONE SEEN
RBC: 4.13 MIL/uL (ref 3.87–5.11)
RDW: 15.3 % (ref 11.5–15.5)
Smear Review: NORMAL
WBC: 12.7 10*3/uL — ABNORMAL HIGH (ref 4.0–10.5)
nRBC: 0 % (ref 0.0–0.2)

## 2020-09-15 LAB — LACTATE DEHYDROGENASE: LDH: 172 U/L (ref 98–192)

## 2020-09-15 NOTE — Progress Notes (Signed)
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 S/p IV Filter explantation.   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  2) CD56 expression on monocytes.  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  --------------------------------------------------------  DIAGNOSIS: CLL  STAGE: I        ;GOALS: COntrol  CURRENT/MOST RECENT THERAPY: surveillaince    CLL (chronic lymphocytic leukemia) (HCC)     INTERVAL HISTORY:  A pleasant 80 year old female patient with above history of History of CLL- on surveillance; and history of bilateral PE- on Eliquis is here for follow-up.  Patient continues to have chronic joint pains back pain.  Not any worse.  She denies any blood in stools or black or stools.    She continues have chronic back pain.  Chronic joint pains.  No night sweats.  No fevers.  No new lumps or bumps.  Chronic fatigue.   Review of Systems  Constitutional: Positive for malaise/fatigue. Negative for chills, diaphoresis, fever and weight loss.  HENT: Negative for nosebleeds and sore throat.   Eyes: Negative for double vision.  Respiratory: Negative for cough, hemoptysis, sputum  production, shortness of breath and wheezing.   Cardiovascular: Negative for chest pain, palpitations, orthopnea and leg swelling.  Gastrointestinal: Negative for abdominal pain, blood in stool, constipation, diarrhea, heartburn, melena, nausea and vomiting.  Genitourinary: Negative for dysuria, frequency and urgency.  Musculoskeletal: Positive for back pain, joint pain and neck pain.  Skin: Negative.  Negative for itching and rash.  Neurological: Negative for dizziness, tingling, focal weakness, weakness and headaches.  Endo/Heme/Allergies: Does not bruise/bleed easily.  Psychiatric/Behavioral: Negative for depression. The patient is not nervous/anxious and does not have insomnia.     PAST MEDICAL HISTORY :  Past Medical History:  Diagnosis Date  . Anginal pain ([place])    atypical chest pain  . Anxiety   . Arthritis   . Back pain   . Cancer ([place])    cll  . Chronic kidney disease   . Chronic kidney disease   . CLL (chronic lymphocytic leukemia) ([place])   . Depression   . Epigastric pain   . GERD (gastroesophageal reflux disease)   . Headache   . Hemorrhoids   . Hypercholesteremia   . Hyperlipidemia   . Hypertension   . 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 Macular degeneration   . MGUS (monoclonal gammopathy of unknown significance)   . Morbid obesity ([place])   . Obesity   . Pulmonary embolism ([place])   . PVD (peripheral vascular disease) ([place])   . Sleep apnea     PAST SURGICAL HISTORY :   Past Surgical History:  Procedure Laterality Date  . APPENDECTOMY    . BREAST BIOPSY Left 08/19/04   lt bx/clip-neg  . COLONOSCOPY    . COLONOSCOPY WITH  PROPOFOL N/A 03/10/2017   Procedure: COLONOSCOPY WITH PROPOFOL;  Surgeon: [name], MD;  Location: [facility] ENDOSCOPY;  Service: Endoscopy;  Laterality: N/A;  . ESOPHAGOGASTRODUODENOSCOPY (EGD) WITH PROPOFOL N/A 02/02/2019   Procedure: ESOPHAGOGASTRODUODENOSCOPY (EGD) WITH PROPOFOL;  Surgeon: Toledo, Benay Pike, MD;   Location: [facility] ENDOSCOPY;  Service: Endoscopy;  Laterality: N/A;  . IVC FILTER INSERTION N/A 06/11/2016   Procedure: IVC Filter Insertion;  Surgeon: Katha Cabal, MD;  Location: [place] CV LAB;  Service: Cardiovascular;  Laterality: N/A;  . IVC FILTER REMOVAL N/A 05/06/2017   Procedure: IVC FILTER REMOVAL;  Surgeon: Katha Cabal, MD;  Location: [place] [facility] LAB;  Service: Cardiovascular;  Laterality: N/A;  . JOINT REPLACEMENT Left 06/17/2016  . LAPAROSCOPIC APPENDECTOMY N/A 10/10/2018   Procedure: APPENDECTOMY LAPAROSCOPIC ATTEMPTED CONVERTED TO OPEN;  Surgeon: Herbert Pun, MD;  Location: [facility] ORS;  Service: General;  Laterality: N/A;  . LAPAROSCOPY N/A 10/10/2018   Procedure: LAPAROSCOPY DIAGNOSTIC ATTEMPTED CONVERTED TO OPEN;  Surgeon: Herbert Pun, MD;  Location: [facility] ORS;  Service: General;  Laterality: N/A;  . neck fusion    . PARTIAL COLECTOMY Right 10/10/2018   Procedure: PARTIAL COLECTOMY;  Surgeon: Herbert Pun, MD;  Location: [facility] ORS;  Service: General;  Laterality: Right;  . PERIPHERAL VASCULAR CATHETERIZATION N/A 08/15/2015   Procedure: pulmonary angiogram with lysis;  Surgeon: Katha Cabal, MD;  Location: [place] [facility];  Service: Cardiovascular;  Laterality: N/A;  . WRIST SURGERY Left 2000    FAMILY HISTORY :   Family History  Problem Relation Age of Onset  . Heart failure Father   . Congestive Heart Failure Father   . Pulmonary embolism Mother   . Breast cancer Neg Hx     SOCIAL HISTORY:   Social History   Tobacco Use  . Smoking status: Never Smoker  . Smokeless tobacco: Never Used  Substance Use Topics  . Alcohol use: No    Alcohol/week: 0.0 standard drinks  . Drug use: No    ALLERGIES:  has No Known Allergies.  MEDICATIONS:  Current Outpatient Medications  Medication Sig Dispense Refill  . acetaminophen (TYLENOL) 650 MG CR tablet Take 650-1,300 mg by mouth every 8 (eight) hours as needed for pain.  TYLENOL ARTHRITIS    . albuterol (PROVENTIL HFA;VENTOLIN HFA) 108 (90 Base) MCG/ACT inhaler Inhale 2 puffs into the lungs every 6 (six) hours as needed for wheezing or shortness of breath.    . cholestyramine (QUESTRAN) 4 g packet Take 4 g by mouth 3 (three) times daily with meals.    . DULoxetine (CYMBALTA) 60 MG capsule     . ELIQUIS 5 MG TABS tablet TAKE 1 TABLET TWICE DAILY (Patient taking differently: Take 5 mg by mouth 2 (two) times daily.) 180 tablet 6  . escitalopram (LEXAPRO) 10 MG tablet Take 10 mg by mouth daily.    Marland Kitchen gabapentin (NEURONTIN) 300 MG capsule Take 1 capsule (300 mg total) by mouth 3 (three) times daily. 90 capsule 2  . losartan-hydrochlorothiazide (HYZAAR) 100-12.5 MG tablet Take 1 tablet by mouth daily. Reported on 08/22/2015    . Multiple Vitamins-Minerals (PRESERVISION AREDS 2 PO) Take 1 capsule by mouth 2 (two) times daily.     Marland Kitchen oxyCODONE (OXY IR/ROXICODONE) 5 MG immediate release tablet Take 1 tablet (5 mg total) by mouth 3 (three) times daily as needed for severe pain. Must last 30 days. 65 tablet 0  . pravastatin (PRAVACHOL) 40 MG tablet Take 40 mg by mouth daily.     Marland Kitchen  sucralfate (CARAFATE) 1 g tablet Take 1 g by mouth 4 (four) times daily -  with meals and at bedtime.    . pantoprazole (PROTONIX) 40 MG tablet Take 1 tablet (40 mg total) by mouth 2 (two) times daily for 14 days. (Patient taking differently: Take 40 mg by mouth daily.) 28 tablet 0   No current facility-administered medications for this visit.    PHYSICAL EXAMINATION:   BP 120/72 (BP Location: Right Arm, Patient Position: Sitting, Cuff Size: Large)   Pulse 80   Temp (!) 96.5 F (35.8 C) (Tympanic)   Resp 16   Ht 5\' 5"  (1.651 m)   Wt 257 lb 15 oz (117 kg)   SpO2 95%   BMI 42.92 kg/m   Filed Weights   09/15/20 0927  Weight: 257 lb 15 oz (117 kg)    Physical Exam Constitutional:      Comments: Obese.  Walks by herself.  HENT:     Head: Normocephalic and atraumatic.     Mouth/Throat:      Pharynx: No oropharyngeal exudate.  Eyes:     Pupils: Pupils are equal, round, and reactive to light.  Cardiovascular:     Rate and Rhythm: Normal rate and regular rhythm.  Pulmonary:     Effort: No respiratory distress.     Breath sounds: No wheezing.  Abdominal:     General: Bowel sounds are normal. There is no distension.     Palpations: Abdomen is soft. There is no mass.     Tenderness: There is no abdominal tenderness. There is no guarding or rebound.  Musculoskeletal:        General: No tenderness. Normal range of motion.     Cervical back: Normal range of motion and neck supple.  Skin:    General: Skin is warm.     Comments: Chronic bruises.  Neurological:     Mental Status: She is alert and oriented to person, place, and time.  Psychiatric:        Mood and Affect: Affect normal.      LABORATORY DATA:  I have reviewed the data as listed    Component Value Date/Time   NA 142 09/15/2020 0919   NA 144 06/21/2020 1553   NA 141 04/29/2012 1822   K 3.7 09/15/2020 0919   K 3.9 04/29/2012 1822   CL 104 09/15/2020 0919   CL 107 04/29/2012 1822   CO2 32 09/15/2020 0919   CO2 28 04/29/2012 1822   GLUCOSE 153 (H) 09/15/2020 0919   GLUCOSE 129 (H) 04/29/2012 1822   BUN 19 09/15/2020 0919   BUN 16 06/21/2020 1553   BUN 21 (H) 04/29/2012 1822   CREATININE 0.98 09/15/2020 0919   CREATININE 1.02 (H) 07/28/2014 0950   CALCIUM 8.7 (L) 09/15/2020 0919   CALCIUM 8.7 04/29/2012 1822   PROT 5.9 (L) 09/15/2020 0919   PROT 6.1 06/21/2020 1553   PROT 7.1 04/29/2012 1822   ALBUMIN 3.5 09/15/2020 0919   ALBUMIN 4.2 06/21/2020 1553   ALBUMIN 3.3 (L) 04/29/2012 1822   AST 16 09/15/2020 0919   AST 20 04/29/2012 1822   ALT 16 09/15/2020 0919   ALT 33 04/29/2012 1822   ALKPHOS 83 09/15/2020 0919   ALKPHOS 169 (H) 04/29/2012 1822   BILITOT 0.4 09/15/2020 0919   BILITOT 0.4 06/21/2020 1553   BILITOT 0.3 04/29/2012 1822   GFRNONAA 59 (L) 09/15/2020 0919   GFRNONAA 55 (L)  07/28/2014 0950   GFRAA 55 (L) 09/03/2019 0830  GFRAA >60 07/28/2014 0950    No results found for: SPEP, UPEP  Lab Results  Component Value Date   WBC 12.7 (H) 09/15/2020   NEUTROABS 4.4 09/15/2020   HGB 13.2 09/15/2020   HCT 40.3 09/15/2020   MCV 97.6 09/15/2020   PLT 248 09/15/2020      Chemistry      Component Value Date/Time   NA 142 09/15/2020 0919   NA 144 06/21/2020 1553   NA 141 04/29/2012 1822   K 3.7 09/15/2020 0919   K 3.9 04/29/2012 1822   CL 104 09/15/2020 0919   CL 107 04/29/2012 1822   CO2 32 09/15/2020 0919   CO2 28 04/29/2012 1822   BUN 19 09/15/2020 0919   BUN 16 06/21/2020 1553   BUN 21 (H) 04/29/2012 1822   CREATININE 0.98 09/15/2020 0919   CREATININE 1.02 (H) 07/28/2014 0950      Component Value Date/Time   CALCIUM 8.7 (L) 09/15/2020 0919   CALCIUM 8.7 04/29/2012 1822   ALKPHOS 83 09/15/2020 0919   ALKPHOS 169 (H) 04/29/2012 1822   AST 16 09/15/2020 0919   AST 20 04/29/2012 1822   ALT 16 09/15/2020 0919   ALT 33 04/29/2012 1822   BILITOT 0.4 09/15/2020 0919   BILITOT 0.4 06/21/2020 1553   BILITOT 0.3 04/29/2012 1822        ASSESSMENT & PLAN:   CLL (chronic lymphocytic leukemia) (HCC) # Massive bilateral Pulmonary Embolus-on indefinite anticoagulation.  Continue Eliquis.  # Chronic low-grade B-cell lymphoproliferative disorder/CLL-hemoglobin platelets normal.  Platelets slightly elevated 12 absolute lymphocyte count 4-5,000.  Asymptomatic we will monitor for now.  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  Interestingly May 2020-negative.  Again repeat in 6 months.  Clinically no evidence of progression./Stable.  # fatigue-history of obstructive sleep apnea/continue CPAP.  #Chronic joint pain back pain-defer to pain clinic orthopedic.  # DISPOSITION: # follow up in 2nd week of dec MD labs-[ cbc/cmp/ldh/MM panel; K-light chains]-Dr.B    Cammie Sickle, MD 09/15/2020 3:39 PM

## 2020-09-15 NOTE — Assessment & Plan Note (Addendum)
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  Continue Eliquis.  # Chronic low-grade B-cell lymphoproliferative disorder/CLL-hemoglobin platelets normal.  Platelets slightly elevated 12 absolute lymphocyte count 4-5,000.  Asymptomatic we will monitor for now.  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  Interestingly May 2020-negative.  Again repeat in 6 months.  Clinically no evidence of progression./Stable.  # fatigue-history of obstructive sleep apnea/continue CPAP.  #Chronic joint pain back pain-defer to pain clinic orthopedic.  # DISPOSITION: # follow up in 2nd week of dec MD labs-[ cbc/cmp/ldh/MM panel; K-light chains]-Dr.B

## 2020-09-19 LAB — PROTEIN ELECTROPHORESIS, SERUM
A/G Ratio: 1.3 (ref 0.7–1.7)
Albumin ELP: 3 g/dL (ref 2.9–4.4)
Alpha-1-Globulin: 0.2 g/dL (ref 0.0–0.4)
Alpha-2-Globulin: 0.8 g/dL (ref 0.4–1.0)
Beta Globulin: 1 g/dL (ref 0.7–1.3)
Gamma Globulin: 0.3 g/dL — ABNORMAL LOW (ref 0.4–1.8)
Globulin, Total: 2.3 g/dL (ref 2.2–3.9)
Total Protein ELP: 5.3 g/dL — ABNORMAL LOW (ref 6.0–8.5)

## 2020-09-20 ENCOUNTER — Telehealth: Payer: Self-pay | Admitting: *Deleted

## 2020-09-20 ENCOUNTER — Encounter: Payer: Self-pay | Admitting: Pain Medicine

## 2020-09-20 DIAGNOSIS — R937 Abnormal findings on diagnostic imaging of other parts of musculoskeletal system: Secondary | ICD-10-CM | POA: Insufficient documentation

## 2020-09-20 NOTE — Telephone Encounter (Signed)
Called for pre-visit assessment. No answer at both phone numbers. LVM.

## 2020-09-20 NOTE — Progress Notes (Signed)
Patient: Jamie Phillips  Service Category: E/M  Provider: Gaspar Cola, MD  DOB: [birth year]  DOS: 09/21/2020  Location: Office  MRN: [id number]  Setting: Ambulatory outpatient  Referring Provider: Kirk Ruths, MD  Type: Established Patient  Specialty: Interventional Pain Management  PCP: Kirk Ruths, MD  Location: Remote location  Delivery: TeleHealth     Virtual Encounter - Pain Management PROVIDER NOTE: Information contained herein reflects review and annotations entered in association with encounter. Interpretation of such information and data should be left to medically-trained personnel. Information provided to patient can be located elsewhere in the medical record under "Patient Instructions". Document created using STT-dictation technology, any transcriptional errors that may result from process are unintentional.    Contact & Pharmacy Preferred: [phone] Home: [phone] (home) Mobile: [phone] (mobile) E-mail: No e-mail address on record  [facility] [place], [place] - [place] [place] [address] Phone: [phone] Fax: [phone]  [facility] - [place], [place] [address] Phone: [phone] Fax: [phone] [facility], [place] - [place] [facility] [address] [place] [address] Phone: [phone] Fax: [phone]   Pre-screening  Ms. Stahl offered "in-person" vs "virtual" encounter. She indicated preferring virtual for this encounter.   Reason COVID-19*  Social distancing based on CDC and AMA recommendations.   I contacted [name] on 09/21/2020 via telephone.      I clearly identified myself as Gaspar Cola, MD. I verified that I was speaking with the correct person using two identifiers (Name: Jamie Phillips, and date of birth: 1941-03-15).  Consent I  sought verbal advanced consent from [name] for virtual visit interactions. I informed Ms. Munford of possible security and privacy concerns, risks, and limitations associated with providing "not-in-person" medical evaluation and management services. I also informed Ms. Desaulniers of the availability of "in-person" appointments. Finally, I informed her that there would be a charge for the virtual visit and that she could be  personally, fully or partially, financially responsible for it. Ms. Koch expressed understanding and agreed to proceed.   Historic Elements   Jamie Phillips is a 80 y.o. year old, female patient evaluated today after our last contact on 09/12/2020. 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 She also  has a past surgical history that includes neck fusion; Wrist surgery (Left, 2000); Cardiac catheterization (N/A, 08/15/2015); IVC FILTER INSERTION (N/A, 06/11/2016); Colonoscopy; Joint replacement (Left, 06/17/2016); Colonoscopy with propofol (N/A, 03/10/2017); IVC FILTER REMOVAL (N/A, 05/06/2017); laparoscopy (N/A, 10/10/2018); laparoscopic appendectomy (N/A, 10/10/2018); Partial colectomy (Right, 10/10/2018); Appendectomy; Esophagogastroduodenoscopy (egd) with propofol (N/A, 02/02/2019); and Breast biopsy (Left, 08/19/04). Jamie Phillips has a current medication list which includes the following prescription(s): acetaminophen, albuterol, cholestyramine,  duloxetine, eliquis, escitalopram, gabapentin, losartan-hydrochlorothiazide, multiple vitamins-minerals, oxycodone, pantoprazole, pravastatin, and sucralfate. She  reports that she has never smoked. She has never used smokeless tobacco. She reports that she does not drink alcohol and does not use  drugs. Jamie Phillips has No Known Allergies.   HPI  Today, she is being contacted for a post-procedure assessment.  Initially when the nurse called the patient to get information from the procedure, the patient indicated that she had attained 100% relief of the pain for the duration of the local anesthetic but then it went down to only 50% benefit that lasted 2 days.  However, when I looked into this with more detail, it turns out that the pain in the distribution of the L3 dermatome is completely gone on both lower extremities.  She is currently having pain in the back and going into the buttocks and through the back of the legs, down to the knee.  In reviewing her prior procedures we have that on 08/15/2020 we did a diagnostic bilateral lumbar facet block #1 which provided her with 100% relief of the low back pain and the pain going to the back of the legs, but did not help the pain in the anterior aspect of the legs, mainly in the distribution of the L3 dermatome.  Today I had a very long conversation with the patient trying to explain to her that based on the information obtained from the diagnostic injections, it is now clear to me that she is having pain coming from 2 different places.  The low back pain and pain going down the legs is coming from the lumbar facets, likely secondary to the facet arthrosis related to the L3-4 and L4-5 anterolisthesis.  In the case of the pain going to the anterior aspect of her thighs, this appears to be an L3 radiculitis that is likely to also be associated to the foraminal stenosis at the L3-4 level, also associated with the L3-4 and L4-5 anterolisthesis.  In reviewing the  patient's medical record today I have noticed that she has a CT myelogram of the lumbar spine as well as an MRI, but she is not have x-rays of the lumbar spine on flexion and extension to evaluate the possibility of instability of the L3-4 and L4-5 anterolisthesis.  Today will be entering an order for that x-ray and we will also schedule the patient to return for her second diagnostic bilateral lumbar facet block for her current low back pain and bilateral lower extremity pain through the back of the leg and down to the back of the knees.  In addition today we also talked about her weight and the fact that she needs to try to bring her BMI down below 35, preferably to a BMI of 30 kg/m.  She is currently at a BMI of 42.92 kg/m.  The patient has asked me for some guidance on what she should eat and since this is not my specialty I have entered a referral to medical weight management.  She is already having several problems that can be associated with her morbid obesity such as her chronic pulmonary embolisms, gastroesophageal reflux disease, hyperglycemia, obstructive sleep apnea, and hypertension.  06/16/2020 Lumbar CT IMPRESSION: 1. Mild scoliotic curvature convex to the left. 3 mm of degenerative anterolisthesis L3-4. 6 mm degenerative anterolisthesis L4-5. 2. L3-4: Moderate multifactorial spinal stenosis that could cause neural compression on either or both sides. The facet arthropathy could certainly be painful. 3. L4-5: Severe multifactorial spinal stenosis. Bilateral foraminal stenosis. Neural compression could occur on either side at this level. Facet arthropathy could certainly be painful. 4. L5-S1: Facet arthropathy could contribute back pain or referred facet syndrome pain.  Post-Procedure Evaluation  Procedure (09/05/2020): Therapeutic bilateral  L3 transforaminal ESI #2 under fluoroscopic guidance and IV sedation Pre-procedure pain level: 10/10 Post-procedure: 0/10 (100% relief)  Sedation:  Sedation provided.  Effectiveness during initial hour after procedure(Ultra-Short Term Relief): 100 %.  Local anesthetic used: Long-acting (4-6 hours) Effectiveness: Defined as any analgesic benefit obtained secondary to the administration of local anesthetics. This carries significant diagnostic value as to the etiological location, or anatomical origin, of the pain. Duration of benefit is expected to coincide with the duration of the local anesthetic used.  Effectiveness during initial 4-6 hours after procedure(Short-Term Relief): 100 %.  Long-term benefit: Defined as any relief past the pharmacologic duration of the local anesthetics.  Effectiveness past the initial 6 hours after procedure(Long-Term Relief): 50 % (for one day only).  Current benefits: Defined as benefit that persist at this time.   Analgesia:  After careful evaluation of the results, it turns out that the patient is currently experiencing 100% ongoing relief of the L3 radiculitis and L3 dermatomal pain.  Unfortunately, her back pain and the referred pain through the back of the legs that is coming from the facet joints seems to have recurred.  For this reason we will be bringing the patient back for a second diagnostic bilateral lumbar facet block under fluoroscopic guidance and IV sedation. Function: Somewhat improved ROM: Back to baseline  Pharmacotherapy Assessment  Analgesic: Tramadol 50 mg, 1 tab p.o. 4 times daily (200 mg/day of tramadol) MME/day: 20 mg/day   Monitoring: [place] PMP: PDMP reviewed during this encounter.       Pharmacotherapy: No side-effects or adverse reactions reported. Compliance: No problems identified. Effectiveness: Clinically acceptable. Plan: Refer to "POC".  UDS:  Summary  Date Value Ref Range Status  07/27/2020 Note  Final    Comment:    ==================================================================== [employer] Select 13  (MW) ==================================================================== Test                             Result       Flag       Units  Drug Present and Declared for Prescription Verification   Oxycodone                      1206         EXPECTED   ng/mg creat   Oxymorphone                    90           EXPECTED   ng/mg creat   Noroxycodone                   2368         EXPECTED   ng/mg creat    Sources of oxycodone include scheduled prescription medications.    Oxymorphone and noroxycodone are expected metabolites of oxycodone.    Oxymorphone is also available as a scheduled prescription medication.  ==================================================================== Test                      Result    Flag   Units      Ref Range   Creatinine              212              mg/dL      >=20 ==================================================================== Declared Medications:  The flagging and interpretation on this report are based on the  following  declared medications.  Unexpected results may arise from  inaccuracies in the declared medications.   **Note: The testing scope of this panel includes these medications:   Oxycodone   **Note: The testing scope of this panel does not include the  following reported medications:   Acetaminophen (Tylenol)  Albuterol (Ventolin HFA)  Apixaban (Eliquis)  Cholestyramine (Questran)  Duloxetine (Cymbalta)  Escitalopram (Lexapro)  Gabapentin (Neurontin)  Hydrochlorothiazide (Hyzaar)  Losartan (Hyzaar)  Pantoprazole (Protonix)  Pravastatin (Pravachol)  Sucralfate (Carafate) ==================================================================== For clinical consultation, please call [phone]. ====================================================================     Laboratory Chemistry Profile   Renal Lab Results  Component Value Date   BUN 19 09/15/2020   CREATININE 0.98 09/15/2020   BCR 17 06/21/2020   GFRAA 55 (L)  09/03/2019   GFRNONAA 59 (L) 09/15/2020     Hepatic Lab Results  Component Value Date   AST 16 09/15/2020   ALT 16 09/15/2020   ALBUMIN 3.5 09/15/2020   ALKPHOS 83 09/15/2020   LIPASE 37 10/09/2018     Electrolytes Lab Results  Component Value Date   NA 142 09/15/2020   K 3.7 09/15/2020   CL 104 09/15/2020   CALCIUM 8.7 (L) 09/15/2020   MG 1.9 06/21/2020     Bone Lab Results  Component Value Date   25OHVITD1 14 (L) 06/21/2020   25OHVITD2 <1.0 06/21/2020   25OHVITD3 14 06/21/2020     Inflammation (CRP: Acute Phase) (ESR: Chronic Phase) Lab Results  Component Value Date   CRP 9 06/21/2020   ESRSEDRATE 21 06/21/2020   LATICACIDVEN 0.9 12/13/2018       Note: Above Lab results reviewed.  Imaging  DG PAIN CLINIC C-ARM 1-60 MIN NO REPORT Fluoro was used, but no Radiologist interpretation will be provided.  Please refer to "NOTES" tab for provider progress note.  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  Plan of Care  Problem-specific:  No problem-specific Assessment & Plan notes found for this encounter.  Ms. LATORYA BAUTCH has a current medication list which includes the following long-term medication(s): albuterol, cholestyramine, duloxetine, eliquis, escitalopram, gabapentin, losartan-hydrochlorothiazide, oxycodone, and pantoprazole.  Pharmacotherapy (Medications Ordered): No orders of the defined types were placed in this  encounter.  Orders:  Orders Placed This Encounter  Procedures  . LUMBAR FACET(MEDIAL BRANCH NERVE BLOCK) MBNB    Standing Status:   Future    Standing Expiration Date:   10/21/2020    Scheduling Instructions:     Procedure: Lumbar facet block (AKA.: Lumbosacral medial branch nerve block)     Side: Bilateral     Level: L3-4, L4-5, & L5-S1 Facets (L2, L3, L4, L5, & S1 Medial Branch Nerves)     Sedation: Patient's choice.     Timeframe: ASAA    Order Specific Question:   Where will this procedure be performed?    Answer:   [facility]  . DG Lumbar Spine Complete W/Bend    Patient presents with axial pain with possible radicular component.  In addition to any acute findings, please report on:  1. Facet (Zygapophyseal) joint DJD (Hypertrophy, space narrowing, subchondral  sclerosis, and/or osteophyte formation) 2. DDD and/or IVDD (Loss of disc height, desiccation or "Black disc disease") 3. Pars defects 4. Spondylolisthesis, spondylosis, and/or spondyloarthropathies (include Degree/Grade of displacement in mm) 5. Vertebral body Fractures, including age (old, new/acute) 18. Modic Type Changes 7. Demineralization 8. Bone pathology 9. Central, Lateral Recess, and/or Foraminal Stenosis (include AP diameter of stenosis in mm) 10. Surgical changes (hardware type, status, and presence of fibrosis)  NOTE: Please specify level(s) and laterality. If applicable: Please indicate ROM and/or evidence of instability (>51m displacement between flexion and extension views)    Standing Status:   Future    Standing Expiration Date:   10/21/2020    Scheduling Instructions:     Imaging must be done as soon as possible. Inform patient that order will expire within 30 days and I will not renew it.    Order Specific Question:   Reason for Exam (SYMPTOM  OR DIAGNOSIS REQUIRED)    Answer:   Low back pain    Order Specific Question:   Preferred imaging location?    Answer:    [facility]    Order  Specific Question:   Call Results- Best Contact Number?    Answer:   [phone]([facility]    Order Specific Question:   Radiology Contrast Protocol - do NOT remove file path    Answer:   [email or web address]    Order Specific Question:   Release to patient    Answer:   Immediate  . Amb Ref to Medical Weight Management    Referral Priority:   Routine    Referral Type:   Consultation    Referral Reason:   Specialty Services Required    Number of Visits Requested:   1  . Blood Thinner Instructions to Nursing    Always make sure patient has clearance from prescribing physician to stop blood thinners for interventional therapies. If the patient requires a Lovenox-bridge therapy, make sure arrangements are made to institute it with the assistance of the PCP.    Scheduling Instructions:     Have Ms. PLedeestop the Eliquis (Apixaban) x 3 days prior to procedure or surgery.   Follow-up plan:   Return for Procedure (w/ sedation): (B) L-FCT BLK #2, (Blood Thinner Protocol).      Interventional Therapies  Risk  Complexity Considerations:   Estimated body mass index is 43.6 kg/m as calculated from the following:   Height as of this encounter: 5' 5" (1.651 m).   Weight as of this encounter: 262 lb (118.8 kg). NOTE: Eliquis Anticoagulation: (Stop: 3 days  Restart: 6 hrs)   Planned  Pending:      Under consideration:   Diagnostic midline T12-L1 LESI    Completed:   Diagnostic bilateral lumbar facet block x1 (08/15/2020)  Diagnostic bilateral L3 TFESI x1 (07/11/2020) (100/100/100/100% relief of the bilateral L1/L2 radiculitis)  Diagnostic right L2-3 LESI x1 (07/11/2020) (100/100/100/100% relief of the bilateral L1/L2 radiculitis)    Therapeutic  Palliative (PRN) options:   Palliative/therapeutic bilateral L3 TFESI #2 + right L2-3 LESI #2      Recent Visits Date Type Provider Dept  09/05/20 Procedure visit NMilinda Pointer MD  [facility]  08/24/20 Office Visit NMilinda Pointer MD [facility]  08/15/20 Procedure visit NMilinda Pointer MD [facility]  07/27/20 Office Visit NMilinda Pointer MD [facility]  07/11/20 Procedure visit NMilinda Pointer MD [facility]  Showing recent visits within past 90 days and meeting all other requirements Today's  Visits Date Type Provider Dept  09/21/20 Telemedicine Milinda Pointer, MD [facility]  Showing today's visits and meeting all other requirements Future Appointments No visits were found meeting these conditions. Showing future appointments within next 90 days and meeting all other requirements  I discussed the assessment and treatment plan with the patient. The patient was provided an opportunity to ask questions and all were answered. The patient agreed with the plan and demonstrated an understanding of the instructions.  Patient advised to call back or seek an in-person evaluation if the symptoms or condition worsens.  Duration of encounter: 19 minutes.  Note by: Gaspar Cola, MD Date: 09/21/2020; Time: 6:49 PM

## 2020-09-21 ENCOUNTER — Other Ambulatory Visit: Payer: Self-pay

## 2020-09-21 ENCOUNTER — Ambulatory Visit: Payer: Medicare HMO | Attending: Pain Medicine | Admitting: Pain Medicine

## 2020-09-21 DIAGNOSIS — Z9989 Dependence on other enabling machines and devices: Secondary | ICD-10-CM

## 2020-09-21 DIAGNOSIS — M5136 Other intervertebral disc degeneration, lumbar region: Secondary | ICD-10-CM

## 2020-09-21 DIAGNOSIS — R937 Abnormal findings on diagnostic imaging of other parts of musculoskeletal system: Secondary | ICD-10-CM | POA: Diagnosis not present

## 2020-09-21 DIAGNOSIS — M4316 Spondylolisthesis, lumbar region: Secondary | ICD-10-CM | POA: Diagnosis not present

## 2020-09-21 DIAGNOSIS — M545 Low back pain, unspecified: Secondary | ICD-10-CM | POA: Diagnosis not present

## 2020-09-21 DIAGNOSIS — M79605 Pain in left leg: Secondary | ICD-10-CM

## 2020-09-21 DIAGNOSIS — M4317 Spondylolisthesis, lumbosacral region: Secondary | ICD-10-CM

## 2020-09-21 DIAGNOSIS — R29898 Other symptoms and signs involving the musculoskeletal system: Secondary | ICD-10-CM

## 2020-09-21 DIAGNOSIS — K219 Gastro-esophageal reflux disease without esophagitis: Secondary | ICD-10-CM

## 2020-09-21 DIAGNOSIS — M5417 Radiculopathy, lumbosacral region: Secondary | ICD-10-CM

## 2020-09-21 DIAGNOSIS — M48062 Spinal stenosis, lumbar region with neurogenic claudication: Secondary | ICD-10-CM

## 2020-09-21 DIAGNOSIS — M79604 Pain in right leg: Secondary | ICD-10-CM | POA: Diagnosis not present

## 2020-09-21 DIAGNOSIS — M4310 Spondylolisthesis, site unspecified: Secondary | ICD-10-CM

## 2020-09-21 DIAGNOSIS — G8929 Other chronic pain: Secondary | ICD-10-CM

## 2020-09-21 DIAGNOSIS — Z6841 Body Mass Index (BMI) 40.0 and over, adult: Secondary | ICD-10-CM

## 2020-09-21 DIAGNOSIS — R7303 Prediabetes: Secondary | ICD-10-CM

## 2020-09-21 DIAGNOSIS — G4733 Obstructive sleep apnea (adult) (pediatric): Secondary | ICD-10-CM

## 2020-09-21 DIAGNOSIS — M47816 Spondylosis without myelopathy or radiculopathy, lumbar region: Secondary | ICD-10-CM

## 2020-09-21 DIAGNOSIS — I10 Essential (primary) hypertension: Secondary | ICD-10-CM

## 2020-09-21 DIAGNOSIS — Z7901 Long term (current) use of anticoagulants: Secondary | ICD-10-CM | POA: Diagnosis not present

## 2020-09-21 NOTE — Patient Instructions (Signed)
____________________________________________________________________________________________  Preparing for Procedure with Sedation  Procedure appointments are limited to planned procedures: . No Prescription Refills. . No disability issues will be discussed. . No medication changes will be discussed.  Instructions: . Oral Intake: Do not eat or drink anything for at least 8 hours prior to your procedure. (Exception: Blood Pressure Medication. See below.) . Transportation: Unless otherwise stated by your physician, you may drive yourself after the procedure. . Blood Pressure Medicine: Do not forget to take your blood pressure medicine with a sip of water the morning of the procedure. If your Diastolic (lower reading)is above 100 mmHg, elective cases will be cancelled/rescheduled. . Blood thinners: These will need to be stopped for procedures. Notify our staff if you are taking any blood thinners. Depending on which one you take, there will be specific instructions on how and when to stop it. . Diabetics on insulin: Notify the staff so that you can be scheduled 1st case in the morning. If your diabetes requires high dose insulin, take only  of your normal insulin dose the morning of the procedure and notify the staff that you have done so. . Preventing infections: Shower with an antibacterial soap the morning of your procedure. . Build-up your immune system: Take 1000 mg of Vitamin C with every meal (3 times a day) the day prior to your procedure. . Antibiotics: Inform the staff if you have a condition or reason that requires you to take antibiotics before dental procedures. . Pregnancy: If you are pregnant, call and cancel the procedure. . Sickness: If you have a cold, fever, or any active infections, call and cancel the procedure. . Arrival: You must be in the facility at least 30 minutes prior to your scheduled procedure. . Children: Do not bring children with you. . Dress appropriately:  Bring dark clothing that you would not mind if they get stained. . Valuables: Do not bring any jewelry or valuables.  Reasons to call and reschedule or cancel your procedure: (Following these recommendations will minimize the risk of a serious complication.) . Surgeries: Avoid having procedures within 2 weeks of any surgery. (Avoid for 2 weeks before or after any surgery). . Flu Shots: Avoid having procedures within 2 weeks of a flu shots or . (Avoid for 2 weeks before or after immunizations). . Barium: Avoid having a procedure within 7-10 days after having had a radiological study involving the use of radiological contrast. (Myelograms, Barium swallow or enema study). . Heart attacks: Avoid any elective procedures or surgeries for the initial 6 months after a "Myocardial Infarction" (Heart Attack). . Blood thinners: It is imperative that you stop these medications before procedures. Let us know if you if you take any blood thinner.  . Infection: Avoid procedures during or within two weeks of an infection (including chest colds or gastrointestinal problems). Symptoms associated with infections include: Localized redness, fever, chills, night sweats or profuse sweating, burning sensation when voiding, cough, congestion, stuffiness, runny nose, sore throat, diarrhea, nausea, vomiting, cold or Flu symptoms, recent or current infections. It is specially important if the infection is over the area that we intend to treat. . Heart and lung problems: Symptoms that may suggest an active cardiopulmonary problem include: cough, chest pain, breathing difficulties or shortness of breath, dizziness, ankle swelling, uncontrolled high or unusually low blood pressure, and/or palpitations. If you are experiencing any of these symptoms, cancel your procedure and contact your primary care physician for an evaluation.  Remember:  Regular Business hours are:    Monday to Thursday 8:00 AM to 4:00 PM  Provider's  Schedule: Jamie Pointer, MD:  Procedure days: Tuesday and Thursday 7:30 AM to 4:00 PM  Gillis Santa, MD:  Procedure days: Monday and Wednesday 7:30 AM to 4:00 PM ____________________________________________________________________________________________   ____________________________________________________________________________________________  General Risks and Possible Complications  Patient Responsibilities: It is important that you read this as it is part of your informed consent. It is our duty to inform you of the risks and possible complications associated with treatments offered to you. It is your responsibility as a patient to read this and to ask questions about anything that is not clear or that you believe was not covered in this document.  Patient's Rights: You have the right to refuse treatment. You also have the right to change your mind, even after initially having agreed to have the treatment done. However, under this last option, if you wait until the last second to change your mind, you may be charged for the materials used up to that point.  Introduction: Medicine is not an Chief Strategy Officer. Everything in Medicine, including the lack of treatment(s), carries the potential for danger, harm, or loss (which is by definition: Risk). In Medicine, a complication is a secondary problem, condition, or disease that can aggravate an already existing one. All treatments carry the risk of possible complications. The fact that a side effects or complications occurs, does not imply that the treatment was conducted incorrectly. It must be clearly understood that these can happen even when everything is done following the highest safety standards.  No treatment: You can choose not to proceed with the proposed treatment alternative. The "PRO(s)" would include: avoiding the risk of complications associated with the therapy. The "CON(s)" would include: not getting any of the treatment  benefits. These benefits fall under one of three categories: diagnostic; therapeutic; and/or palliative. Diagnostic benefits include: getting information which can ultimately lead to improvement of the disease or symptom(s). Therapeutic benefits are those associated with the successful treatment of the disease. Finally, palliative benefits are those related to the decrease of the primary symptoms, without necessarily curing the condition (example: decreasing the pain from a flare-up of a chronic condition, such as incurable terminal cancer).  General Risks and Complications: These are associated to most interventional treatments. They can occur alone, or in combination. They fall under one of the following six (6) categories: no benefit or worsening of symptoms; bleeding; infection; nerve damage; allergic reactions; and/or death. 1. No benefits or worsening of symptoms: In Medicine there are no guarantees, only probabilities. No healthcare provider can ever guarantee that a medical treatment will work, they can only state the probability that it may. Furthermore, there is always the possibility that the condition may worsen, either directly, or indirectly, as a consequence of the treatment. 2. Bleeding: This is more common if the patient is taking a blood thinner, either prescription or over the counter (example: Goody Powders, Fish oil, Aspirin, Garlic, etc.), or if suffering a condition associated with impaired coagulation (example: Hemophilia, cirrhosis of the liver, low platelet counts, etc.). However, even if you do not have one on these, it can still happen. If you have any of these conditions, or take one of these drugs, make sure to notify your treating physician. 3. Infection: This is more common in patients with a compromised immune system, either due to disease (example: diabetes, cancer, human immunodeficiency virus [HIV], etc.), or due to medications or treatments (example: therapies used to treat  cancer and  rheumatological diseases). However, even if you do not have one on these, it can still happen. If you have any of these conditions, or take one of these drugs, make sure to notify your treating physician. 4. Nerve Damage: This is more common when the treatment is an invasive one, but it can also happen with the use of medications, such as those used in the treatment of cancer. The damage can occur to small secondary nerves, or to large primary ones, such as those in the spinal cord and brain. This damage may be temporary or permanent and it may lead to impairments that can range from temporary numbness to permanent paralysis and/or brain death. 5. Allergic Reactions: Any time a substance or material comes in contact with our body, there is the possibility of an allergic reaction. These can range from a mild skin rash (contact dermatitis) to a severe systemic reaction (anaphylactic reaction), which can result in death. 6. Death: In general, any medical intervention can result in death, most of the time due to an unforeseen complication. ____________________________________________________________________________________________  ____________________________________________________________________________________________  Blood Thinners  IMPORTANT NOTICE:  If you take any of these, make sure to notify the nursing staff.  Failure to do so may result in injury.  Recommended time intervals to stop and restart blood-thinners, before & after invasive procedures  Generic Name Brand Name Stop Time. Must be stopped at least this long before procedures. After procedures, wait at least this long before re-starting.  Abciximab Reopro 15 days 2 hrs  Alteplase Activase 10 days 10 days  Anagrelide Agrylin    Apixaban Eliquis 3 days 6 hrs  Cilostazol Pletal 3 days 5 hrs  Clopidogrel Plavix 7-10 days 2 hrs  Dabigatran Pradaxa 5 days 6 hrs  Dalteparin Fragmin 24 hours 4 hrs  Dipyridamole Aggrenox  11days 2 hrs  Edoxaban Lixiana; Savaysa 3 days 2 hrs  Enoxaparin  Lovenox 24 hours 4 hrs  Eptifibatide Integrillin 8 hours 2 hrs  Fondaparinux  Arixtra 72 hours 12 hrs  Hydroxychloroquine Plaquenil 11 days   Prasugrel Effient 7-10 days 6 hrs  Reteplase Retavase 10 days 10 days  Rivaroxaban Xarelto 3 days 6 hrs  Ticagrelor Brilinta 5-7 days 6 hrs  Ticlopidine Ticlid 10-14 days 2 hrs  Tinzaparin Innohep 24 hours 4 hrs  Tirofiban Aggrastat 8 hours 2 hrs  Warfarin Coumadin 5 days 2 hrs   Other medications with blood-thinning effects  Product indications Generic (Brand) names Note  Cholesterol Lipitor Stop 4 days before procedure  Blood thinner (injectable) Heparin (LMW or LMWH Heparin) Stop 24 hours before procedure  Cancer Ibrutinib (Imbruvica) Stop 7 days before procedure  Malaria/Rheumatoid Hydroxychloroquine (Plaquenil) Stop 11 days before procedure  Thrombolytics  10 days before or after procedures   Over-the-counter (OTC) Products with blood-thinning effects  Product Common names Stop Time  Aspirin > 325 mg Goody Powders, Excedrin, etc. 11 days  Aspirin ? 81 mg  7 days  Fish oil  4 days  Garlic supplements  7 days  Ginkgo biloba  36 hours  Ginseng  24 hours  NSAIDs Ibuprofen, Naprosyn, etc. 3 days  Vitamin E  4 days   ____________________________________________________________________________________________   ______________________________________________________________________________________________  Body mass index (BMI)  Body mass index (BMI) is a common tool for deciding whether a person has an appropriate body weight.  It measures a persons weight in relation to their height.   According to the [employer] of health (NIH): Marland Kitchen A BMI of less than 18.5 means that a person  is underweight. . A BMI of between 18.5 and 24.9 is ideal. . A BMI of between 25 and 29.9 is overweight. . A BMI over 30 indicates obesity.  Weight Management Required  URGENT: Your  weight has been found to be adversely affecting your health.  Dear Ms. Leppert:  Your current Estimated body mass index is 42.92 kg/m as calculated from the following:   Height as of 09/15/20: $RemoveBef'5\' 5"'[id number]$  (1.651 m).   Weight as of 09/15/20: 257 lb 15 oz (117 kg).  Please use the table below to identify your weight category and associated incidence of chronic pain, secondary to your weight.  Body Mass Index (BMI) Classification BMI level (kg/m2) Category Associated incidence of chronic pain  <18  Underweight   18.5-24.9 Ideal body weight   25-29.9 Overweight  20%  30-34.9 Obese (Class I)  68%  35-39.9 Severe obesity (Class II)  136%  >40 Extreme obesity (Class III)  254%   In addition: You will be considered "Morbidly Obese", if your BMI is above 30 and you have one or more of the following conditions which are known to be caused and/or directly associated with obesity: 1.    Type 2 Diabetes (Which in turn can lead to cardiovascular diseases (CVD), stroke, peripheral vascular diseases (PVD), retinopathy, nephropathy, and neuropathy) 2.    Cardiovascular Disease (High Blood Pressure; Congestive Heart Failure; High Cholesterol; Coronary Artery Disease; Angina; or History of Heart Attacks) 3.    Breathing problems (Asthma; obesity-hypoventilation syndrome; obstructive sleep apnea; chronic inflammatory airway disease; reactive airway disease; or shortness of breath) 4.    Chronic kidney disease 5.    Liver disease (nonalcoholic fatty liver disease) 6.    High blood pressure 7.    Acid reflux (gastroesophageal reflux disease; heartburn) 8.    Osteoarthritis (OA) (with any of the following: hip pain; knee pain; and/or low back pain) 9.    Low back pain (Lumbar Facet Syndrome; and/or Degenerative Disc Disease) 10.  Hip pain (Osteoarthritis of hip) (For every 1 lbs of added body weight, there is a 2 lbs increase in pressure inside of each hip articulation. 1:2 mechanical relationship) 11.  Knee pain  (Osteoarthritis of knee) (For every 1 lbs of added body weight, there is a 4 lbs increase in pressure inside of each knee articulation. 1:4 mechanical relationship) (patients with a BMI>30 kg/m2 were 6.8 times more likely to develop knee OA than normal-weight individuals) 12.  Cancer: Epidemiological studies have shown that obesity is a risk factor for: post-menopausal breast cancer; cancers of the endometrium, colon and kidney cancer; malignant adenomas of the oesophagus. Obese subjects have an approximately 1.5-3.5-fold increased risk of developing these cancers compared with normal-weight subjects, and it has been estimated that between 15 and 45% of these cancers can be attributed to overweight. More recent studies suggest that obesity may also increase the risk of other types of cancer, including pancreatic, hepatic and gallbladder cancer. (Ref: Obesity and cancer. Pischon T, Nthlings U, [employer] H. Proc Nutr Soc. 2008 May;67(2):128-45. doi: 25.3664/Q0347425956387564.) The International Agency for Research on Cancer (IARC) has identified 13 cancers associated with overweight and obesity: meningioma, multiple myeloma, adenocarcinoma of the esophagus, and cancers of the thyroid, postmenopausal breast cancer, gallbladder, stomach, liver, pancreas, kidney, ovaries, uterus, colon and rectal (colorectal) cancers. 85 percent of all cancers diagnosed in women and 24 percent of those diagnosed in men are associated with overweight and obesity.  Recommendation: At this point it is urgent that you take a step  back and concentrate in loosing weight. Dedicate 100% of your efforts on this task. Nothing else will improve your health more than bringing your weight down and your BMI to less than 30. If you are here, you probably have chronic pain. We know that most chronic pain patients have difficulty exercising secondary to their pain. For this reason, you must rely on proper nutrition and diet in order to lose the weight.  If your BMI is above 40, you should seriously consider bariatric surgery. A realistic goal is to lose 10% of your body weight over a period of 12 months.  Be honest to yourself, if over time you have unsuccessfully tried to lose weight, then it is time for you to seek professional help and to enter a medically supervised weight management program, and/or undergo bariatric surgery. Stop procrastinating.   Pain management considerations:  1.    Pharmacological Problems: Be advised that the use of opioid analgesics (oxycodone; hydrocodone; morphine; methadone; codeine; and all of their derivatives) have been associated with decreased metabolism and weight gain.  For this reason, should we see that you are unable to lose weight while taking these medications, it may become necessary for [name] to taper down and indefinitely discontinue them.  2.    Technical Problems: The incidence of successful interventional therapies decreases as the patient's BMI increases. It is much more difficult to accomplish a safe and effective interventional therapy on a patient with a BMI above 35. 3.    Radiation Exposure Problems: The x-rays machine, used to accomplish injection therapies, will automatically increase their x-ray output in order to capture an appropriate bone image. This means that radiation exposure increases exponentially with the patient's BMI. (The higher the BMI, the higher the radiation exposure.) Although the level of radiation used at a given time is still safe to the patient, it is not for the physician and/or assisting staff. Unfortunately, radiation exposure is accumulative. Because physicians and the staff have to do procedures and be exposed on a daily basis, this can result in health problems such as cancer and radiation burns. Radiation exposure to the staff is monitored by the radiation batches that they wear. The exposure levels are reported back to the staff on a quarterly basis. Depending on levels of  exposure, physicians and staff may be obligated by law to decrease this exposure. This means that they have the right and obligation to refuse providing therapies where they may be overexposed to radiation. For this reason, physicians may decline to offer therapies such as radiofrequency ablation or implants to patients with a BMI above 40. 4.    Current Trends: Be advised that the current trend is to no longer offer certain therapies to patients with a BMI equal to, or above 35, due to increase perioperative risks, increased technical procedural difficulties, and excessive radiation exposure to healthcare personnel.  ______________________________________________________________________________________________

## 2020-09-22 NOTE — Progress Notes (Signed)
I left vmail about her xrays and sent AVS to her.

## 2020-09-25 ENCOUNTER — Ambulatory Visit
Admission: RE | Admit: 2020-09-25 | Discharge: 2020-09-25 | Disposition: A | Discharge status: Home / Self Care | Payer: Medicare HMO | Attending: Pain Medicine | Admitting: Pain Medicine

## 2020-09-25 ENCOUNTER — Telehealth: Payer: Self-pay | Admitting: Pain Medicine

## 2020-09-25 ENCOUNTER — Ambulatory Visit
Admission: RE | Admit: 2020-09-25 | Discharge: 2020-09-25 | Disposition: A | Discharge status: Home / Self Care | Payer: Medicare HMO | Source: Ambulatory Visit | Attending: Pain Medicine | Admitting: Pain Medicine

## 2020-09-25 DIAGNOSIS — M4310 Spondylolisthesis, site unspecified: Secondary | ICD-10-CM

## 2020-09-25 DIAGNOSIS — M4317 Spondylolisthesis, lumbosacral region: Secondary | ICD-10-CM | POA: Insufficient documentation

## 2020-09-25 DIAGNOSIS — M4696 Unspecified inflammatory spondylopathy, lumbar region: Secondary | ICD-10-CM | POA: Diagnosis not present

## 2020-09-25 DIAGNOSIS — M5116 Intervertebral disc disorders with radiculopathy, lumbar region: Secondary | ICD-10-CM | POA: Diagnosis not present

## 2020-09-25 DIAGNOSIS — M4316 Spondylolisthesis, lumbar region: Secondary | ICD-10-CM | POA: Insufficient documentation

## 2020-09-25 DIAGNOSIS — M4726 Other spondylosis with radiculopathy, lumbar region: Secondary | ICD-10-CM | POA: Diagnosis not present

## 2020-09-25 NOTE — Telephone Encounter (Signed)
Patient informed that Dr. Dossie Arbour ordered an x-ray of her lumbar spine. Also instructed her to stop Eliquis 3 days before procedure.

## 2020-10-03 ENCOUNTER — Ambulatory Visit: Payer: Medicare HMO | Admitting: Pain Medicine

## 2020-10-03 DIAGNOSIS — H353221 Exudative age-related macular degeneration, left eye, with active choroidal neovascularization: Secondary | ICD-10-CM | POA: Diagnosis not present

## 2020-10-05 ENCOUNTER — Encounter: Payer: Self-pay | Admitting: Pain Medicine

## 2020-10-05 ENCOUNTER — Ambulatory Visit (HOSPITAL_BASED_OUTPATIENT_CLINIC_OR_DEPARTMENT_OTHER): Payer: Medicare HMO | Admitting: Pain Medicine

## 2020-10-05 ENCOUNTER — Ambulatory Visit
Admission: RE | Admit: 2020-10-05 | Discharge: 2020-10-05 | Disposition: A | Discharge status: Home / Self Care | Payer: Medicare HMO | Source: Ambulatory Visit | Attending: Pain Medicine | Admitting: Pain Medicine

## 2020-10-05 ENCOUNTER — Other Ambulatory Visit: Payer: Self-pay

## 2020-10-05 VITALS — BP 133/69 | HR 54 | Temp 97.2°F | Resp 15 | Ht 65.0 in | Wt 259.0 lb

## 2020-10-05 DIAGNOSIS — M47816 Spondylosis without myelopathy or radiculopathy, lumbar region: Secondary | ICD-10-CM | POA: Insufficient documentation

## 2020-10-05 DIAGNOSIS — M4316 Spondylolisthesis, lumbar region: Secondary | ICD-10-CM | POA: Insufficient documentation

## 2020-10-05 DIAGNOSIS — Z7901 Long term (current) use of anticoagulants: Secondary | ICD-10-CM | POA: Diagnosis not present

## 2020-10-05 DIAGNOSIS — M5137 Other intervertebral disc degeneration, lumbosacral region: Secondary | ICD-10-CM | POA: Insufficient documentation

## 2020-10-05 DIAGNOSIS — G8929 Other chronic pain: Secondary | ICD-10-CM

## 2020-10-05 DIAGNOSIS — M4310 Spondylolisthesis, site unspecified: Secondary | ICD-10-CM | POA: Diagnosis not present

## 2020-10-05 DIAGNOSIS — M4317 Spondylolisthesis, lumbosacral region: Secondary | ICD-10-CM | POA: Diagnosis not present

## 2020-10-05 DIAGNOSIS — M47817 Spondylosis without myelopathy or radiculopathy, lumbosacral region: Secondary | ICD-10-CM

## 2020-10-05 DIAGNOSIS — M545 Low back pain, unspecified: Secondary | ICD-10-CM | POA: Insufficient documentation

## 2020-10-05 MED ORDER — LIDOCAINE HCL 2 % IJ SOLN
20.0000 mL | Freq: Once | INTRAMUSCULAR | Status: AC
  Administered 2020-10-05: 400 mg

## 2020-10-05 MED ORDER — ROPIVACAINE HCL 2 MG/ML IJ SOLN
INTRAMUSCULAR | Status: AC
  Filled 2020-10-05: qty 40

## 2020-10-05 MED ORDER — FENTANYL CITRATE (PF) 100 MCG/2ML IJ SOLN
25.0000 ug | INTRAMUSCULAR | Status: DC | PRN
Start: 2020-10-05 — End: 2020-10-05

## 2020-10-05 MED ORDER — TRIAMCINOLONE ACETONIDE 40 MG/ML IJ SUSP
80.0000 mg | Freq: Once | INTRAMUSCULAR | Status: AC
Start: 2020-10-05 — End: 2020-10-05
  Administered 2020-10-05: 80 mg

## 2020-10-05 MED ORDER — LACTATED RINGERS IV SOLN
1000.0000 mL | Freq: Once | INTRAVENOUS | Status: AC
  Administered 2020-10-05: 1000 mL via INTRAVENOUS

## 2020-10-05 MED ORDER — ROPIVACAINE HCL 2 MG/ML IJ SOLN
18.0000 mL | Freq: Once | INTRAMUSCULAR | Status: AC
  Administered 2020-10-05: 18 mL via PERINEURAL

## 2020-10-05 MED ORDER — MIDAZOLAM HCL 5 MG/5ML IJ SOLN
1.0000 mg | INTRAMUSCULAR | Status: DC | PRN
  Administered 2020-10-05: 1 mg via INTRAVENOUS
  Administered 2020-10-05: 2 mg via INTRAVENOUS

## 2020-10-05 MED ORDER — LIDOCAINE HCL 2 % IJ SOLN
INTRAMUSCULAR | Status: AC
  Filled 2020-10-05: qty 20

## 2020-10-05 MED ORDER — MIDAZOLAM HCL 5 MG/5ML IJ SOLN
INTRAMUSCULAR | Status: AC
  Filled 2020-10-05: qty 5

## 2020-10-05 MED ORDER — TRIAMCINOLONE ACETONIDE 40 MG/ML IJ SUSP
INTRAMUSCULAR | Status: AC
  Filled 2020-10-05: qty 2

## 2020-10-05 MED ORDER — FENTANYL CITRATE (PF) 100 MCG/2ML IJ SOLN
INTRAMUSCULAR | Status: AC
  Filled 2020-10-05: qty 2

## 2020-10-05 NOTE — Progress Notes (Signed)
PROVIDER NOTE: Information contained herein reflects review and annotations entered in association with encounter. Interpretation of such information and data should be left to medically-trained personnel. Information provided to patient can be located elsewhere in the medical record under "Patient Instructions". Document created using STT-dictation technology, any transcriptional errors that may result from process are unintentional.    Patient: Jamie Phillips  Service Category: Procedure  Provider: Gaspar Cola, MD  DOB: [birth year]  DOS: 10/05/2020  Location: [facility]  MRN: [id number]  Setting: Ambulatory - outpatient  Referring Provider: Milinda Pointer, MD  Type: Established Patient  Specialty: Interventional Pain Management  PCP: Kirk Ruths, MD   Primary Reason for Visit: Interventional Pain Management Treatment. CC: Back Pain  Procedure:          Anesthesia, Analgesia, Anxiolysis:  Type: Lumbar Facet, Medial Branch Block(s) #2  Primary Purpose: Diagnostic Region: Posterolateral Lumbosacral Spine Level: L2, L3, L4, L5, & S1 Medial Branch Level(s). Injecting these levels blocks the L3-4, L4-5, and L5-S1 lumbar facet joints. Laterality: Bilateral  Type: Moderate (Conscious) Sedation combined with Local Anesthesia Indication(s): Analgesia and Anxiety Route: Intravenous (IV) IV Access: Secured Sedation: Meaningful verbal contact was maintained at all times during the procedure  Local Anesthetic: Lidocaine 1-2%  Position: Prone   Indications: 1. Lumbar facet syndrome (Multilevel) (Bilateral)   2. Spondylosis without myelopathy or radiculopathy, lumbosacral region   3. Lumbar facet hypertrophy (Multilevel) (Bilateral)   4. DDD (degenerative disc disease), lumbosacral   5. 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 Chronic anticoagulation (Eliquis)   7. 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 Retrolisthesis (L1/L2)   9.  Anterolisthesis of lumbosacral spine (L5/S1)   10. Lumbar facet joint syndrome   11. Facet hypertrophy of lumbar region   12. Chronic bilateral low back pain without sciatica    Pain Score: Pre-procedure: 10-Worst pain ever/10 Post-procedure: 3 /10   Pre-op H&P Assessment:  Ms. Chaudhary is a 80 y.o. (year old), female patient, seen today for interventional treatment. She  has a past surgical history that includes neck fusion; Wrist surgery (Left, 2000); Cardiac catheterization (N/A, 08/15/2015); IVC FILTER INSERTION (N/A, 06/11/2016); Colonoscopy; Joint replacement (Left, 06/17/2016); Colonoscopy with propofol (N/A, 03/10/2017); IVC FILTER REMOVAL (N/A, 05/06/2017); laparoscopy (N/A, 10/10/2018); laparoscopic appendectomy (N/A, 10/10/2018); Partial colectomy (Right, 10/10/2018); Appendectomy; Esophagogastroduodenoscopy (egd) with propofol (N/A, 02/02/2019); and Breast biopsy (Left, 08/19/04). Ms. Ellery has a current medication list which includes the following prescription(s): acetaminophen, albuterol, cholestyramine, duloxetine, eliquis, escitalopram, gabapentin, losartan-hydrochlorothiazide, multiple vitamins-minerals, oxycodone, pravastatin, sucralfate, and pantoprazole, and the following Facility-Administered Medications: fentanyl and midazolam. Her primarily concern today is the Back Pain  Initial Vital Signs:  Pulse/HCG Rate: (!) 57ECG Heart Rate: (!) 53 Temp: (!) 97.2 F (36.2 C) Resp: (!) 21 BP: (!) 145/72 SpO2: 97 %  BMI: Estimated body mass index is 43.1 kg/m as calculated from the following:   Height as of this encounter: 5\' 5"  (1.651 m).   Weight as of this encounter: 259 lb (117.5 kg).  Risk Assessment: Allergies: Reviewed. She has No Known Allergies.  Allergy Precautions: None required Coagulopathies: Reviewed. None identified.  Blood-thinner therapy: None at this time Active Infection(s): Reviewed. None identified. Ms. Trawick is afebrile  Site Confirmation: Ms. Mandrell  was asked to confirm the procedure and laterality before marking the site Procedure checklist: Completed Consent: Before the procedure and under the influence of no sedative(s), amnesic(s), or anxiolytics, the patient was informed of the treatment options, risks  and possible complications. To fulfill our ethical and legal obligations, as recommended by the American Medical Association's Code of Ethics, I have informed the patient of my clinical impression; the nature and purpose of the treatment or procedure; the risks, benefits, and possible complications of the intervention; the alternatives, including doing nothing; the risk(s) and benefit(s) of the alternative treatment(s) or procedure(s); and the risk(s) and benefit(s) of doing nothing. The patient was provided information about the general risks and possible complications associated with the procedure. These may include, but are not limited to: failure to achieve desired goals, infection, bleeding, organ or nerve damage, allergic reactions, paralysis, and death. In addition, the patient was informed of those risks and complications associated to Spine-related procedures, such as failure to decrease pain; infection (i.e.: Meningitis, epidural or intraspinal abscess); bleeding (i.e.: epidural hematoma, subarachnoid hemorrhage, or any other type of intraspinal or peri-dural bleeding); organ or nerve damage (i.e.: Any type of peripheral nerve, nerve root, or spinal cord injury) with subsequent damage to sensory, motor, and/or autonomic systems, resulting in permanent pain, numbness, and/or weakness of one or several areas of the body; allergic reactions; (i.e.: anaphylactic reaction); and/or death. Furthermore, the patient was informed of those risks and complications associated with the medications. These include, but are not limited to: allergic reactions (i.e.: anaphylactic or anaphylactoid reaction(s)); adrenal axis suppression; blood sugar elevation that  in diabetics may result in ketoacidosis or comma; water retention that in patients with history of congestive heart failure may result in shortness of breath, pulmonary edema, and decompensation with resultant heart failure; weight gain; swelling or edema; medication-induced neural toxicity; particulate matter embolism and blood vessel occlusion with resultant organ, and/or nervous system infarction; and/or aseptic necrosis of one or more joints. Finally, the patient was informed that Medicine is not an exact science; therefore, there is also the possibility of unforeseen or unpredictable risks and/or possible complications that may result in a catastrophic outcome. The patient indicated having understood very clearly. We have given the patient no guarantees and we have made no promises. Enough time was given to the patient to ask questions, all of which were answered to the patient's satisfaction. Ms. Soloway has indicated that she wanted to continue with the procedure. Attestation: I, the ordering provider, attest that I have discussed with the patient the benefits, risks, side-effects, alternatives, likelihood of achieving goals, and potential problems during recovery for the procedure that I have provided informed consent. Date  Time: 10/05/2020  8:58 AM  Pre-Procedure Preparation:  Monitoring: As per clinic protocol. Respiration, ETCO2, SpO2, BP, heart rate and rhythm monitor placed and checked for adequate function Safety Precautions: Patient was assessed for positional comfort and pressure points before starting the procedure. Time-out: I initiated and conducted the "Time-out" before starting the procedure, as per protocol. The patient was asked to participate by confirming the accuracy of the "Time Out" information. Verification of the correct person, site, and procedure were performed and confirmed by me, the nursing staff, and the patient. "Time-out" conducted as per Joint Commission's Universal  Protocol (UP.01.01.01). Time: 0923  Description of Procedure:          Laterality: Bilateral. The procedure was performed in identical fashion on both sides. Levels:  L2, L3, L4, L5, & S1 Medial Branch Level(s) Area Prepped: Posterior Lumbosacral Region DuraPrep (Iodine Povacrylex [0.7% available iodine] and Isopropyl Alcohol, 74% w/w) Safety Precautions: Aspiration looking for blood return was conducted prior to all injections. At no point did we inject any substances, as  a needle was being advanced. Before injecting, the patient was told to immediately notify me if she was experiencing any new onset of "ringing in the ears, or metallic taste in the mouth". No attempts were made at seeking any paresthesias. Safe injection practices and needle disposal techniques used. Medications properly checked for expiration dates. SDV (single dose vial) medications used. After the completion of the procedure, all disposable equipment used was discarded in the proper designated medical waste containers. Local Anesthesia: Protocol guidelines were followed. The patient was positioned over the fluoroscopy table. The area was prepped in the usual manner. The time-out was completed. The target area was identified using fluoroscopy. A 12-in long, straight, sterile hemostat was used with fluoroscopic guidance to locate the targets for each level blocked. Once located, the skin was marked with an approved surgical skin marker. Once all sites were marked, the skin (epidermis, dermis, and hypodermis), as well as deeper tissues (fat, connective tissue and muscle) were infiltrated with a small amount of a short-acting local anesthetic, loaded on a 10cc syringe with a 25G, 1.5-in  Needle. An appropriate amount of time was allowed for local anesthetics to take effect before proceeding to the next step. Local Anesthetic: Lidocaine 2.0% The unused portion of the local anesthetic was discarded in the proper designated  containers. Technical explanation of process:  L2 Medial Branch Nerve Block (MBB): The target area for the L2 medial branch is at the junction of the postero-lateral aspect of the superior articular process and the superior, posterior, and medial edge of the transverse process of L3. Under fluoroscopic guidance, a Quincke needle was inserted until contact was made with os over the superior postero-lateral aspect of the pedicular shadow (target area). After negative aspiration for blood, 0.5 mL of the nerve block solution was injected without difficulty or complication. The needle was removed intact. L3 Medial Branch Nerve Block (MBB): The target area for the L3 medial branch is at the junction of the postero-lateral aspect of the superior articular process and the superior, posterior, and medial edge of the transverse process of L4. Under fluoroscopic guidance, a Quincke needle was inserted until contact was made with os over the superior postero-lateral aspect of the pedicular shadow (target area). After negative aspiration for blood, 0.5 mL of the nerve block solution was injected without difficulty or complication. The needle was removed intact. L4 Medial Branch Nerve Block (MBB): The target area for the L4 medial branch is at the junction of the postero-lateral aspect of the superior articular process and the superior, posterior, and medial edge of the transverse process of L5. Under fluoroscopic guidance, a Quincke needle was inserted until contact was made with os over the superior postero-lateral aspect of the pedicular shadow (target area). After negative aspiration for blood, 0.5 mL of the nerve block solution was injected without difficulty or complication. The needle was removed intact. L5 Medial Branch Nerve Block (MBB): The target area for the L5 medial branch is at the junction of the postero-lateral aspect of the superior articular process and the superior, posterior, and medial edge of the  sacral ala. Under fluoroscopic guidance, a Quincke needle was inserted until contact was made with os over the superior postero-lateral aspect of the pedicular shadow (target area). After negative aspiration for blood, 0.5 mL of the nerve block solution was injected without difficulty or complication. The needle was removed intact. S1 Medial Branch Nerve Block (MBB): The target area for the S1 medial branch is at the posterior and  inferior 6 o'clock position of the L5-S1 facet joint. Under fluoroscopic guidance, the Quincke needle inserted for the L5 MBB was redirected until contact was made with os over the inferior and postero aspect of the sacrum, at the 6 o' clock position under the L5-S1 facet joint (Target area). After negative aspiration for blood, 0.5 mL of the nerve block solution was injected without difficulty or complication. The needle was removed intact.  Nerve block solution: 0.2% PF-Ropivacaine + Triamcinolone (40 mg/mL) diluted to a final concentration of 4 mg of Triamcinolone/mL of Ropivacaine The unused portion of the solution was discarded in the proper designated containers. Procedural Needles: 22-gauge, 3.5-inch, Quincke needles used for all levels.  Once the entire procedure was completed, the treated area was cleaned, making sure to leave some of the prepping solution back to take advantage of its long term bactericidal properties.      Illustration of the posterior view of the lumbar spine and the posterior neural structures. Laminae of L2 through S1 are labeled. DPRL5, dorsal primary ramus of L5; DPRS1, dorsal primary ramus of S1; DPR3, dorsal primary ramus of L3; FJ, facet (zygapophyseal) joint L3-L4; I, inferior articular process of L4; LB1, lateral branch of dorsal primary ramus of L1; IAB, inferior articular branches from L3 medial branch (supplies L4-L5 facet joint); IBP, intermediate branch plexus; MB3, medial branch of dorsal primary ramus of L3; NR3, third lumbar nerve  root; S, superior articular process of L5; SAB, superior articular branches from L4 (supplies L4-5 facet joint also); TP3, transverse process of L3.  Vitals:   10/05/20 0934 10/05/20 0944 10/05/20 0954 10/05/20 1004  BP: (!) 157/81 128/74 (!) 131/53 133/69  Pulse: (!) 54     Resp: 16 12 12 15   Temp:      SpO2: 97% 93% 95% 95%  Weight:      Height:         Start Time: 0923 hrs. End Time: 0934 hrs.  Imaging Guidance (Spinal):          Type of Imaging Technique: Fluoroscopy Guidance (Spinal) Indication(s): Assistance in needle guidance and placement for procedures requiring needle placement in or near specific anatomical locations not easily accessible without such assistance. Exposure Time: Please see nurses notes. Contrast: None used. Fluoroscopic Guidance: I was personally present during the use of fluoroscopy. "Tunnel Vision Technique" used to obtain the best possible view of the target area. Parallax error corrected before commencing the procedure. "Direction-depth-direction" technique used to introduce the needle under continuous pulsed fluoroscopy. Once target was reached, antero-posterior, oblique, and lateral fluoroscopic projection used confirm needle placement in all planes. Images permanently stored in EMR. Interpretation: No contrast injected. I personally interpreted the imaging intraoperatively. Adequate needle placement confirmed in multiple planes. Permanent images saved into the patient's record.  Antibiotic Prophylaxis:   Anti-infectives (From admission, onward)    None      Indication(s): None identified  Post-operative Assessment:  Post-procedure Vital Signs:  Pulse/HCG Rate: (!) 54 (sb)(!) 55 Temp: (!) 97.2 F (36.2 C) Resp: 15 BP: 133/69 SpO2: 95 %  EBL: None  Complications: No immediate post-treatment complications observed by team, or reported by patient.  Note: The patient tolerated the entire procedure well. A repeat set of vitals were taken  after the procedure and the patient was kept under observation following institutional policy, for this type of procedure. Post-procedural neurological assessment was performed, showing return to baseline, prior to discharge. The patient was provided with post-procedure discharge instructions, including a section on  how to identify potential problems. Should any problems arise concerning this procedure, the patient was given instructions to immediately contact us, at any time, without hesitation. In any case, we plan to contact the patient by telephone for a follow-up status report regarding this interventional procedure.  Comments:  No additional relevant information.  Plan of Care  Orders:  Orders Placed This Encounter  Procedures   LUMBAR FACET(MEDIAL BRANCH NERVE BLOCK) MBNB    Scheduling Instructions:     Procedure: Lumbar facet block (AKA.: Lumbosacral medial branch nerve block)     Side: Bilateral     Level: L3-4, L4-5, & L5-S1 Facets (L2, L3, L4, L5, & S1 Medial Branch Nerves)     Sedation: Patient's choice.     Timeframe: Today    Order Specific Question:   Where will this procedure be performed?    Answer:   [facility]   DG [facility] C-ARM 1-60 MIN NO REPORT    Intraoperative interpretation by procedural physician at [place].    Standing Status:   Standing    Number of Occurrences:   1    Order Specific Question:   Reason for exam:    Answer:   Assistance in needle guidance and placement for procedures requiring needle placement in or near specific anatomical locations not easily accessible without such assistance.   Informed Consent Details: Physician/Practitioner Attestation; Transcribe to consent form and obtain patient signature    Nursing Order: Transcribe to consent form and obtain patient signature. Note: Always confirm laterality of pain with Ms. Hardin Negus, before procedure.    Order Specific Question:   Physician/Practitioner attestation of  informed consent for procedure/surgical case    Answer:   I, the physician/practitioner, attest that I have discussed with the patient the benefits, risks, side effects, alternatives, likelihood of achieving goals and potential problems during recovery for the procedure that I have provided informed consent.    Order Specific Question:   Procedure    Answer:   Lumbar Facet Block  under fluoroscopic guidance    Order Specific Question:   Physician/Practitioner performing the procedure    Answer:   Reinhart Saulters A. 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   Care order/instruction: Please confirm that the patient has stopped the Eliquis (Apixaban) x 3 days prior to procedure or surgery.    Please confirm that the patient has stopped the Eliquis (Apixaban) x 3 days prior to procedure or surgery.    Standing Status:   Standing    Number of Occurrences:   1   Provide equipment / supplies at bedside    "Block Tray" (Disposable  single use) Needle type: SpinalSpinal Amount/quantity: 4 Size: Medium (5-inch) Gauge: 22G    Standing Status:   Standing    Number of Occurrences:   1    Order Specific Question:   Specify    Answer:   Block Tray   Bleeding precautions    Standing Status:   Standing    Number of Occurrences:   1    Chronic Opioid Analgesic:  Tramadol 50 mg, 1 tab p.o. 4 times daily (200 mg/day of tramadol) MME/day: 20 mg/day   Medications ordered for procedure: Meds ordered this encounter  Medications   lidocaine (XYLOCAINE) 2 % (with pres)  injection 400 mg   lactated ringers infusion 1,000 mL   midazolam (VERSED) 5 MG/5ML injection 1-2 mg    Make sure Flumazenil is available in the pyxis when using this medication. If oversedation occurs, administer 0.2 mg IV over 15 sec. If after  45 sec no response, administer 0.2 mg again over 1 min; may repeat at 1 min intervals; not to exceed 4 doses (1 mg)   fentaNYL (SUBLIMAZE) injection 25-50 mcg    Make sure Narcan is available in the pyxis when using this medication. In the event of respiratory depression (RR< 8/min): Titrate NARCAN (naloxone) in increments of 0.1 to 0.2 mg IV at 2-3 minute intervals, until desired degree of reversal.   ropivacaine (PF) 2 mg/mL (0.2%) (NAROPIN) injection 18 mL   triamcinolone acetonide (KENALOG-40) injection 80 mg    Medications administered: We administered lidocaine, lactated ringers, midazolam, ropivacaine (PF) 2 mg/mL (0.2%), and triamcinolone acetonide.  See the medical record for exact dosing, route, and time of administration.  Follow-up plan:   Return in about 2 weeks (around 10/19/2020) for procedure day (afternoon VV) (PPE).      Interventional Therapies  Risk  Complexity Considerations:   Estimated body mass index is 43.6 kg/m as calculated from the following:   Height as of this encounter: 5\' 5"  (1.651 m).   Weight as of this encounter: 262 lb (118.8 kg). NOTE: Eliquis Anticoagulation: (Stop: 3 days  Restart: 6 hrs)   Planned  Pending:      Under consideration:   Diagnostic midline T12-L1 LESI    Completed:   Diagnostic bilateral lumbar facet block x1 (08/15/2020)  Diagnostic bilateral L3 TFESI x1 (07/11/2020) (100/100/100/100% relief of the bilateral L1/L2 radiculitis)  Diagnostic right L2-3 LESI x1 (07/11/2020) (100/100/100/100% relief of the bilateral L1/L2 radiculitis)    Therapeutic  Palliative (PRN) options:   Palliative/therapeutic bilateral L3 TFESI #2 + right L2-3 LESI #2       Recent Visits Date Type Provider Dept  09/21/20 Telemedicine Milinda Pointer, MD [facility]  09/05/20 Procedure visit Milinda Pointer, MD [facility]  08/24/20 Office Visit Milinda Pointer, MD [facility]  08/15/20 Procedure visit Milinda Pointer, MD [facility]  07/27/20 Office Visit Milinda Pointer, MD [facility]  07/11/20 Procedure visit Milinda Pointer, MD [facility]  Showing recent visits within past 90 days and meeting all other requirements Today's Visits Date Type Provider Dept  10/05/20 Procedure visit Milinda Pointer, MD [facility]  Showing today's visits and meeting all other requirements Future Appointments Date Type Provider Dept  10/18/20 Appointment Milinda Pointer, MD [facility]  Showing future appointments within next 90 days and meeting all other requirements Disposition: Discharge home  Discharge (Date  Time): 10/05/2020; 1005 hrs.   Primary Care Physician: Kirk Ruths, MD Location: [facility] Note by: Gaspar Cola, MD Date: 10/05/2020; Time: 10:18 AM  Disclaimer:  Medicine is not an Chief Strategy Officer. The only guarantee in medicine is that nothing is guaranteed. It is important to note that the decision to proceed with this intervention was based on the information collected from the patient. The Data and conclusions were drawn from the patient's questionnaire, the interview, and the physical examination. Because the information was provided in large part by the patient, it cannot be guaranteed that it has not been purposely or unconsciously manipulated. Every effort has been made to obtain as much relevant data as possible for this evaluation. It  is important to note that the conclusions that lead to this procedure are derived in large part from the available data. Always take into account that the treatment will also be dependent on availability of resources and existing treatment guidelines, considered by other Pain Management Practitioners as being common knowledge and practice, at the time of the intervention. For Medico-Legal purposes, it is also important to point out that variation in procedural  techniques and pharmacological choices are the acceptable norm. The indications, contraindications, technique, and results of the above procedure should only be interpreted and judged by a Board-Certified Interventional Pain Specialist with extensive familiarity and expertise in the same exact procedure and technique.

## 2020-10-05 NOTE — Progress Notes (Signed)
Safety precautions to be maintained throughout the outpatient stay will include: orient to surroundings, keep bed in low position, maintain call bell within reach at all times, provide assistance with transfer out of bed and ambulation.  

## 2020-10-05 NOTE — Patient Instructions (Signed)

## 2020-10-06 ENCOUNTER — Telehealth: Payer: Self-pay

## 2020-10-06 NOTE — Telephone Encounter (Signed)
Post procedure phone call. Patient states she is doing good.  

## 2020-10-09 ENCOUNTER — Telehealth: Payer: Self-pay

## 2020-10-09 NOTE — Telephone Encounter (Signed)
Patient states she is hurting, the procedures dont help and her pain meds  dont help. She wants to know if there is anything else he can give her to take for her pain. I did remind her of her virtual appt next week and that she should speak to the doctor about it then, but she would appreciate a nurse calling her to discuss.

## 2020-10-09 NOTE — Telephone Encounter (Signed)
Instructed patient that she needed to wait up to 10 days to see if the steroid kicks in.  Patient has virtual appointment on 6-29 and will discuss medicatoins with Dr Dossie Arbour.

## 2020-10-17 ENCOUNTER — Encounter: Payer: Self-pay | Admitting: Pain Medicine

## 2020-10-17 NOTE — Progress Notes (Signed)
Patient: Jamie Phillips  Service Category: E/M  Provider: Gaspar Cola, MD  DOB: July 26, 1940  DOS: 10/18/2020  Location: Office  MRN: [id number]  Setting: Ambulatory outpatient  Referring Provider: Kirk Ruths, MD  Type: Established Patient  Specialty: Interventional Pain Management  PCP: Kirk Ruths, MD  Location: Remote location  Delivery: TeleHealth     Virtual Encounter - Pain Management PROVIDER NOTE: Information contained herein reflects review and annotations entered in association with encounter. Interpretation of such information and data should be left to medically-trained personnel. Information provided to patient can be located elsewhere in the medical record under "Patient Instructions". Document created using STT-dictation technology, any transcriptional errors that may result from process are unintentional.    Contact & Pharmacy Preferred: [place]: [phone] (home) Mobile: [phone] (mobile) E-mail: No e-mail address on record  [facility] [place], [place] - [address] [place] [address] Phone: [phone] Fax: [phone]  [facility] ([facility]) - [place], [place] [address] Phone: [phone] Fax: [phone]  [facility] [place], [place] [facility] [address] AT [place] [address] Phone: [phone] Fax: [phone]   Pre-screening  Jamie Phillips offered "in-person" vs "virtual" encounter. She indicated preferring virtual for this encounter.   Reason COVID-19*  Social distancing based on CDC and AMA recommendations.   I contacted Jamie Phillips on 10/18/2020 via telephone.      I clearly identified myself as Gaspar Cola, MD. I verified that I was speaking with the correct person using two identifiers (Name: Jamie Phillips, and  date of birth: 09-Jan-1941).  Consent I sought verbal advanced consent from Jamie Phillips for virtual visit interactions. I informed Jamie Phillips of possible security and privacy concerns, risks, and limitations associated with providing "not-in-person" medical evaluation and management services. I also informed Jamie Phillips of the availability of "in-person" appointments. Finally, I informed her that there would be a charge for the virtual visit and that she could be  personally, fully or partially, financially responsible for it. Ms. Gieske expressed understanding and agreed to proceed.   Historic Elements   Jamie Phillips is a 80 y.o. year old, female patient evaluated today after our last contact on 10/05/2020. 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 She also  has a past surgical history that includes neck fusion; Wrist surgery (Left, 2000); Cardiac catheterization (N/A, 08/15/2015); IVC FILTER INSERTION (N/A, 06/11/2016); Colonoscopy; Joint replacement (Left, 06/17/2016); Colonoscopy with propofol (N/A, 03/10/2017); IVC FILTER REMOVAL (N/A, 05/06/2017); laparoscopy (N/A, 10/10/2018); laparoscopic appendectomy (N/A, 10/10/2018); Partial colectomy (Right, 10/10/2018); Appendectomy; Esophagogastroduodenoscopy (egd) with propofol (N/A, 02/02/2019); and Breast biopsy (Left, 08/19/04). Jamie Phillips has a current medication list which includes the following prescription(s):  acetaminophen, albuterol, cholestyramine, duloxetine, eliquis, escitalopram, gabapentin, losartan-hydrochlorothiazide, multiple vitamins-minerals, pantoprazole, pravastatin, sucralfate, and oxycodone. She  reports that she has never smoked. She has never used smokeless tobacco. She reports that she does not drink  alcohol and does not use drugs. Jamie Phillips has No Known Allergies.   HPI  Today, she is being contacted for a post-procedure assessment.  She describes having attained a 100% ongoing relief of the lower extremity pain.  However, the low back pain did return.  At this point, I believe that she would probably benefit from radiofrequency ablation of the lumbar facets, once we can bring her BMI down to 35 kg/m.  Today we went over the results of her recent x-rays, which I have explained to the patient in layman's terms.  The patient asked me what he would take to improve significantly her low back pain and I have informed her that she would need to bring her BMI below 30 kg/m.  Since she is 5 feet 5 inches tall that means that she needs to bring her weight below 180 pounds.  She is currently 259 pounds.  Unknown with possible explanation for her low back pain would be the pathology that she has in the upper lumbar region.  In the past we had done a bilateral L3 TFESI along with a right L2-3 LESI, which seem to have provided her with good relief of the pain.  Today we have decided to repeat that.  If she again gets good benefit, this would mean that we are dealing with a combination of pain coming from the inside as well as the outside of the lumbar spine.  RTCB: 11/17/2020  Post-Procedure Evaluation  Procedure (10/05/2020): Diagnostic bilateral lumbar facet block #2 under fluoroscopic guidance and IV sedation Pre-procedure pain level: 10/10 Post-procedure: 3/10 (> 50% relief)  Anxiolysis: Moderate conscious sedation.  Effectiveness during initial hour after procedure (Ultra-Short Term Relief):  100 %.  Local anesthetic used: Long-acting (4-6 hours) Effectiveness: Defined as any analgesic benefit obtained secondary to the administration of local anesthetics. This carries significant diagnostic value as to the etiological location, or anatomical origin, of the pain. Duration of benefit is expected to coincide with the duration of the local anesthetic used.  Effectiveness during initial 4-6 hours after procedure (Short-Term Relief): 100 %.  Long-term benefit: Defined as any relief past the pharmacologic duration of the local anesthetics.  Effectiveness past the initial 6 hours after procedure (Long-Term Relief): 100 % (good relief in her legs long term but none in her low back long term.).  Benefits, current: Defined as benefit present at the time of this evaluation.   Analgesia:   The patient refers having attained a 100% ongoing relief of her lower extremity pain.  However, in the case of the lower back it does not seem to have lasted that long. Function: Somewhat improved ROM: Somewhat improved  Pharmacotherapy Assessment  Analgesic: Tramadol 50 mg, 1 tab p.o. 4 times daily (200 mg/day of tramadol) MME/day: 20 mg/day   Monitoring: [place] PMP: PDMP reviewed during this encounter.       Pharmacotherapy: No side-effects or adverse reactions reported. Compliance: No problems identified. Effectiveness: Clinically acceptable. Plan: Refer to "POC".  UDS:  Summary  Date Value Ref Range Status  07/27/2020 Note  Final    Comment:    ==================================================================== [employer] Select 13 (MW) ==================================================================== Test                             Result       Flag       Units  Drug Present and Declared for Prescription Verification   Oxycodone  1206         EXPECTED   ng/mg creat   Oxymorphone                    90           EXPECTED   ng/mg creat   Noroxycodone                   2368          EXPECTED   ng/mg creat    Sources of oxycodone include scheduled prescription medications.    Oxymorphone and noroxycodone are expected metabolites of oxycodone.    Oxymorphone is also available as a scheduled prescription medication.  ==================================================================== Test                      Result    Flag   Units      Ref Range   Creatinine              212              mg/dL      >=20 ==================================================================== Declared Medications:  The flagging and interpretation on this report are based on the  following declared medications.  Unexpected results may arise from  inaccuracies in the declared medications.   **Note: The testing scope of this panel includes these medications:   Oxycodone   **Note: The testing scope of this panel does not include the  following reported medications:   Acetaminophen (Tylenol)  Albuterol (Ventolin HFA)  Apixaban (Eliquis)  Cholestyramine (Questran)  Duloxetine (Cymbalta)  Escitalopram (Lexapro)  Gabapentin (Neurontin)  Hydrochlorothiazide (Hyzaar)  Losartan (Hyzaar)  Pantoprazole (Protonix)  Pravastatin (Pravachol)  Sucralfate (Carafate) ==================================================================== For clinical consultation, please call [phone]. ====================================================================     Laboratory Chemistry Profile   Renal Lab Results  Component Value Date   BUN 19 09/15/2020   CREATININE 0.98 09/15/2020   BCR 17 06/21/2020   GFRAA 55 (L) 09/03/2019   GFRNONAA 59 (L) 09/15/2020    Hepatic Lab Results  Component Value Date   AST 16 09/15/2020   ALT 16 09/15/2020   ALBUMIN 3.5 09/15/2020   ALKPHOS 83 09/15/2020   LIPASE 37 10/09/2018    Electrolytes Lab Results  Component Value Date   NA 142 09/15/2020   K 3.7 09/15/2020   CL 104 09/15/2020   CALCIUM 8.7 (L) 09/15/2020   MG 1.9 06/21/2020     Bone Lab Results  Component Value Date   25OHVITD1 14 (L) 06/21/2020   25OHVITD2 <1.0 06/21/2020   25OHVITD3 14 06/21/2020    Inflammation (CRP: Acute Phase) (ESR: Chronic Phase) Lab Results  Component Value Date   CRP 9 06/21/2020   ESRSEDRATE 21 06/21/2020   LATICACIDVEN 0.9 12/13/2018          Note: Above Lab results reviewed.  Imaging  DG PAIN CLINIC C-ARM 1-60 MIN NO REPORT Fluoro was used, but no Radiologist interpretation will be provided.  Please refer to "NOTES" tab for provider progress note.  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  Plan of  Care  Problem-specific:  No problem-specific Assessment & Plan notes found for this encounter.  Jamie Phillips has a current medication list which includes the following long-term medication(s): albuterol, cholestyramine, duloxetine, eliquis, escitalopram, gabapentin, losartan-hydrochlorothiazide, pantoprazole, and oxycodone.  Pharmacotherapy (Medications Ordered): Meds ordered this encounter  Medications   oxyCODONE (OXY IR/ROXICODONE) 5 MG immediate release tablet    Sig: Take 1 tablet (5 mg total) by mouth 3 (three) times daily as needed for severe pain. Must last 30 days.    Dispense:  65 tablet    Refill:  0    Not a duplicate. Do NOT delete! Dispense 1 day early if closed on fill date. Warn not to take CNS-depressants 8 hours before or after taking opioid. Do not send refill request. Renewal requires appointment.     Orders:  Orders Placed This Encounter  Procedures   Blood Thinner Instructions to Nursing    Always make sure patient has clearance from prescribing physician to stop blood thinners for interventional therapies. If the patient requires a Lovenox-bridge therapy, make sure arrangements are made to institute it with the assistance of the PCP.    Scheduling Instructions:     Have Jamie Phillips stop the Eliquis (Apixaban) x 3 days prior to procedure or surgery.    Follow-up plan:   Return for Procedure (w/ sedation): (B) L3 TFESI #2 + (R) L2-3 LESI #2, (Blood Thinner Protocol).     Interventional Therapies  Risk  Complexity Considerations:   Estimated body mass index is 43.6 kg/m as calculated from the following:   Height as of this encounter: '5\' 5"'  (1.651 m).   Weight as of this encounter: 262 lb (118.8 kg). NOTE: Eliquis Anticoagulation: (Stop: 3 days  Restart: 6 hrs)   Planned  Pending:      Under consideration:   Diagnostic midline T12-L1 LESI    Completed:   Diagnostic bilateral lumbar facet block x1 (08/15/2020)  Diagnostic bilateral L3 TFESI x1 (07/11/2020) (100/100/100/100% relief of the bilateral L1/L2 radiculitis)  Diagnostic right L2-3 LESI x1 (07/11/2020) (100/100/100/100% relief of the bilateral L1/L2 radiculitis)    Therapeutic  Palliative (PRN) options:   Palliative/therapeutic bilateral L3 TFESI #2 + right L2-3 LESI #2     Recent Visits Date Type Provider Dept  10/05/20 Procedure visit Milinda Pointer, MD [facility]  09/21/20 Telemedicine Milinda Pointer, MD [facility]  09/05/20 Procedure visit Milinda Pointer, MD [facility]  08/24/20 Office Visit Milinda Pointer, MD [facility]  08/15/20 Procedure visit Milinda Pointer, MD [facility]  07/27/20 Office Visit Milinda Pointer, MD [facility]  Showing recent visits within past 90 days and meeting all other requirements Today's  Visits Date Type Provider Dept  10/18/20 Telemedicine Milinda Pointer, MD [facility]  Showing today's visits and meeting all other requirements Future Appointments No visits were found meeting these conditions. Showing future appointments within next 90 days and meeting all other requirements I discussed the assessment and treatment plan with the patient. The patient was provided an opportunity to ask questions and all were answered. The patient agreed with the plan and demonstrated an understanding of the instructions.  Patient advised to call back or seek an in-person evaluation if the symptoms or condition worsens.  Duration of encounter: 20 minutes.  Note by: Gaspar Cola, MD Date: 10/18/2020; Time: 1:14 PM

## 2020-10-17 NOTE — Patient Instructions (Signed)
______________________________________________________________________________________________  Body mass index (BMI)  Body mass index (BMI) is a common tool for deciding whether a person has an appropriate body weight.  It measures a persons weight in relation to their height.   According to the [facility] of health (NIH): A BMI of less than 18.5 means that a person is underweight. A BMI of between 18.5 and 24.9 is ideal. A BMI of between 25 and 29.9 is overweight. A BMI over 30 indicates obesity.  Weight Management Required  URGENT: Your weight has been found to be adversely affecting your health.  Dear Jamie Phillips:  Your current Estimated body mass index is 43.1 kg/m as calculated from the following:   Height as of 10/05/20: $RemoveBef'5\' 5"'bDOUbYXYxM$  (1.651 m).   Weight as of 10/05/20: 259 lb (117.5 kg).  Please use the table below to identify your weight category and associated incidence of chronic pain, secondary to your weight.  Body Mass Index (BMI) Classification BMI level (kg/m2) Category Associated incidence of chronic pain  <18  Underweight   18.5-24.9 Ideal body weight   25-29.9 Overweight  20%  30-34.9 Obese (Class I)  68%  35-39.9 Severe obesity (Class II)  136%  >40 Extreme obesity (Class III)  254%   In addition: You will be considered "Morbidly Obese", if your BMI is above 30 and you have one or more of the following conditions which are known to be caused and/or directly associated with obesity: 1.    Type 2 Diabetes (Which in turn can lead to cardiovascular diseases (CVD), stroke, peripheral vascular diseases (PVD), retinopathy, nephropathy, and neuropathy) 2.    Cardiovascular Disease (High Blood Pressure; Congestive Heart Failure; High Cholesterol; Coronary Artery Disease; Angina; or History of Heart Attacks) 3.    Breathing problems (Asthma; obesity-hypoventilation syndrome; obstructive sleep apnea; chronic inflammatory airway disease; reactive airway disease; or  shortness of breath) 4.    Chronic kidney disease 5.    Liver disease (nonalcoholic fatty liver disease) 6.    High blood pressure 7.    Acid reflux (gastroesophageal reflux disease; heartburn) 8.    Osteoarthritis (OA) (with any of the following: hip pain; knee pain; and/or low back pain) 9.    Low back pain (Lumbar Facet Syndrome; and/or Degenerative Disc Disease) 10.  Hip pain (Osteoarthritis of hip) (For every 1 lbs of added body weight, there is a 2 lbs increase in pressure inside of each hip articulation. 1:2 mechanical relationship) 11.  Knee pain (Osteoarthritis of knee) (For every 1 lbs of added body weight, there is a 4 lbs increase in pressure inside of each knee articulation. 1:4 mechanical relationship) (patients with a BMI>30 kg/m2 were 6.8 times more likely to develop knee OA than normal-weight individuals) 12.  Cancer: Epidemiological studies have shown that obesity is a risk factor for: post-menopausal breast cancer; cancers of the endometrium, colon and kidney cancer; malignant adenomas of the oesophagus. Obese subjects have an approximately 1.5-3.5-fold increased risk of developing these cancers compared with normal-weight subjects, and it has been estimated that between 15 and 45% of these cancers can be attributed to overweight. More recent studies suggest that obesity may also increase the risk of other types of cancer, including pancreatic, hepatic and gallbladder cancer. (Ref: Obesity and cancer. Pischon T, Nthlings U, [employer] H. Proc Nutr Soc. 2008 May;67(2):128-45. doi: 45.8592/T2446286381771165.) The International Agency for Research on Cancer (IARC) has identified 13 cancers associated with overweight and obesity: meningioma, multiple myeloma, adenocarcinoma of the esophagus, and cancers of the thyroid,  postmenopausal breast cancer, gallbladder, stomach, liver, pancreas, kidney, ovaries, uterus, colon and rectal (colorectal) cancers. 55 percent of all cancers diagnosed in women  and 24 percent of those diagnosed in men are associated with overweight and obesity.  Recommendation: At this point it is urgent that you take a step back and concentrate in loosing weight. Dedicate 100% of your efforts on this task. Nothing else will improve your health more than bringing your weight down and your BMI to less than 30. If you are here, you probably have chronic pain. We know that most chronic pain patients have difficulty exercising secondary to their pain. For this reason, you must rely on proper nutrition and diet in order to lose the weight. If your BMI is above 40, you should seriously consider bariatric surgery. A realistic goal is to lose 10% of your body weight over a period of 12 months.  Be honest to yourself, if over time you have unsuccessfully tried to lose weight, then it is time for you to seek professional help and to enter a medically supervised weight management program, and/or undergo bariatric surgery. Stop procrastinating.   Pain management considerations:  1.    Pharmacological Problems: Be advised that the use of opioid analgesics (oxycodone; hydrocodone; morphine; methadone; codeine; and all of their derivatives) have been associated with decreased metabolism and weight gain.  For this reason, should we see that you are unable to lose weight while taking these medications, it may become necessary for us to taper down and indefinitely discontinue them.  2.    Technical Problems: The incidence of successful interventional therapies decreases as the patient's BMI increases. It is much more difficult to accomplish a safe and effective interventional therapy on a patient with a BMI above 35. 3.    Radiation Exposure Problems: The x-rays machine, used to accomplish injection therapies, will automatically increase their x-ray output in order to capture an appropriate bone image. This means that radiation exposure increases exponentially with the patient's BMI. (The higher the  BMI, the higher the radiation exposure.) Although the level of radiation used at a given time is still safe to the patient, it is not for the physician and/or assisting staff. Unfortunately, radiation exposure is accumulative. Because physicians and the staff have to do procedures and be exposed on a daily basis, this can result in health problems such as cancer and radiation burns. Radiation exposure to the staff is monitored by the radiation batches that they wear. The exposure levels are reported back to the staff on a quarterly basis. Depending on levels of exposure, physicians and staff may be obligated by law to decrease this exposure. This means that they have the right and obligation to refuse providing therapies where they may be overexposed to radiation. For this reason, physicians may decline to offer therapies such as radiofrequency ablation or implants to patients with a BMI above 40. 4.    Current Trends: Be advised that the current trend is to no longer offer certain therapies to patients with a BMI equal to, or above 35, due to increase perioperative risks, increased technical procedural difficulties, and excessive radiation exposure to healthcare personnel.  ______________________________________________________________________________________________    

## 2020-10-18 ENCOUNTER — Other Ambulatory Visit: Payer: Self-pay

## 2020-10-18 ENCOUNTER — Ambulatory Visit: Payer: Medicare HMO | Attending: Pain Medicine | Admitting: Pain Medicine

## 2020-10-18 DIAGNOSIS — M47816 Spondylosis without myelopathy or radiculopathy, lumbar region: Secondary | ICD-10-CM | POA: Diagnosis not present

## 2020-10-18 DIAGNOSIS — Z7901 Long term (current) use of anticoagulants: Secondary | ICD-10-CM

## 2020-10-18 DIAGNOSIS — M5417 Radiculopathy, lumbosacral region: Secondary | ICD-10-CM

## 2020-10-18 DIAGNOSIS — R937 Abnormal findings on diagnostic imaging of other parts of musculoskeletal system: Secondary | ICD-10-CM | POA: Diagnosis not present

## 2020-10-18 DIAGNOSIS — Z79891 Long term (current) use of opiate analgesic: Secondary | ICD-10-CM

## 2020-10-18 DIAGNOSIS — Z79899 Other long term (current) drug therapy: Secondary | ICD-10-CM | POA: Diagnosis not present

## 2020-10-18 DIAGNOSIS — G894 Chronic pain syndrome: Secondary | ICD-10-CM | POA: Diagnosis not present

## 2020-10-18 DIAGNOSIS — M545 Low back pain, unspecified: Secondary | ICD-10-CM | POA: Diagnosis not present

## 2020-10-18 DIAGNOSIS — M79604 Pain in right leg: Secondary | ICD-10-CM | POA: Diagnosis not present

## 2020-10-18 DIAGNOSIS — M79605 Pain in left leg: Secondary | ICD-10-CM

## 2020-10-18 DIAGNOSIS — G8929 Other chronic pain: Secondary | ICD-10-CM

## 2020-10-18 MED ORDER — OXYCODONE HCL 5 MG PO TABS: 5.0000 mg | ORAL_TABLET | Freq: Three times a day (TID) | ORAL | 0 refills | Status: DC | PRN

## 2020-10-19 DIAGNOSIS — R0601 Orthopnea: Secondary | ICD-10-CM | POA: Diagnosis not present

## 2020-10-19 DIAGNOSIS — I129 Hypertensive chronic kidney disease with stage 1 through stage 4 chronic kidney disease, or unspecified chronic kidney disease: Secondary | ICD-10-CM | POA: Diagnosis not present

## 2020-10-19 DIAGNOSIS — I208 Other forms of angina pectoris: Secondary | ICD-10-CM | POA: Diagnosis not present

## 2020-10-19 DIAGNOSIS — R0609 Other forms of dyspnea: Secondary | ICD-10-CM | POA: Diagnosis not present

## 2020-10-19 DIAGNOSIS — G4733 Obstructive sleep apnea (adult) (pediatric): Secondary | ICD-10-CM | POA: Diagnosis not present

## 2020-10-19 DIAGNOSIS — I2692 Saddle embolus of pulmonary artery without acute cor pulmonale: Secondary | ICD-10-CM | POA: Diagnosis not present

## 2020-10-19 DIAGNOSIS — E7800 Pure hypercholesterolemia, unspecified: Secondary | ICD-10-CM | POA: Diagnosis not present

## 2020-10-19 DIAGNOSIS — I5020 Unspecified systolic (congestive) heart failure: Secondary | ICD-10-CM | POA: Diagnosis not present

## 2020-10-20 ENCOUNTER — Other Ambulatory Visit: Payer: Self-pay | Admitting: Student

## 2020-10-20 DIAGNOSIS — I208 Other forms of angina pectoris: Secondary | ICD-10-CM

## 2020-10-20 DIAGNOSIS — I5020 Unspecified systolic (congestive) heart failure: Secondary | ICD-10-CM

## 2020-10-25 ENCOUNTER — Other Ambulatory Visit: Payer: Self-pay | Admitting: Student

## 2020-10-25 DIAGNOSIS — I5020 Unspecified systolic (congestive) heart failure: Secondary | ICD-10-CM

## 2020-10-25 DIAGNOSIS — I208 Other forms of angina pectoris: Secondary | ICD-10-CM

## 2020-10-31 DIAGNOSIS — G4733 Obstructive sleep apnea (adult) (pediatric): Secondary | ICD-10-CM | POA: Diagnosis not present

## 2020-11-01 DIAGNOSIS — G4733 Obstructive sleep apnea (adult) (pediatric): Secondary | ICD-10-CM | POA: Diagnosis not present

## 2020-11-06 ENCOUNTER — Other Ambulatory Visit: Payer: Self-pay

## 2020-11-06 ENCOUNTER — Encounter
Admission: RE | Admit: 2020-11-06 | Discharge: 2020-11-06 | Disposition: A | Discharge status: Home / Self Care | Payer: Medicare HMO | Source: Ambulatory Visit | Attending: Student | Admitting: Student

## 2020-11-06 DIAGNOSIS — I208 Other forms of angina pectoris: Secondary | ICD-10-CM | POA: Insufficient documentation

## 2020-11-06 DIAGNOSIS — I200 Unstable angina: Secondary | ICD-10-CM | POA: Diagnosis not present

## 2020-11-06 DIAGNOSIS — H353221 Exudative age-related macular degeneration, left eye, with active choroidal neovascularization: Secondary | ICD-10-CM | POA: Diagnosis not present

## 2020-11-06 DIAGNOSIS — I5020 Unspecified systolic (congestive) heart failure: Secondary | ICD-10-CM | POA: Diagnosis not present

## 2020-11-06 DIAGNOSIS — I2089 Other forms of angina pectoris: Secondary | ICD-10-CM

## 2020-11-06 MED ORDER — TECHNETIUM TC 99M TETROFOSMIN IV KIT
31.0800 | PACK | Freq: Once | INTRAVENOUS | Status: AC | PRN
  Administered 2020-11-06: 31.08 via INTRAVENOUS

## 2020-11-06 MED ORDER — REGADENOSON 0.4 MG/5ML IV SOLN
0.4000 mg | Freq: Once | INTRAVENOUS | Status: AC
  Administered 2020-11-06: 0.4 mg via INTRAVENOUS

## 2020-11-06 MED ORDER — TECHNETIUM TC 99M TETROFOSMIN IV KIT
9.5700 | PACK | Freq: Once | INTRAVENOUS | Status: AC | PRN
  Administered 2020-11-06: 9.57 via INTRAVENOUS

## 2020-11-06 NOTE — Progress Notes (Signed)
PROVIDER NOTE: Information contained herein reflects review and annotations entered in association with encounter. Interpretation of such information and data should be left to medically-trained personnel. Information provided to patient can be located elsewhere in the medical record under "Patient Instructions". Document created using STT-dictation technology, any transcriptional errors that may result from process are unintentional.    Patient: Jamie Phillips  Service Category: Procedure  Provider: Gaspar Cola, MD  DOB: October 05, 1940  DOS: 11/07/2020  Location: [facility]  MRN: [id number]  Setting: Ambulatory - outpatient  Referring Provider: Kirk Ruths, MD  Type: Established Patient  Specialty: Interventional Pain Management  PCP: Kirk Ruths, MD   Primary Reason for Visit: Interventional Pain Management Treatment. CC: Back Pain  Procedure:          Anesthesia, Analgesia, Anxiolysis:  Type: Trans-Foraminal Epidural Steroid Injection          Purpose: Diagnostic/Therapeutic Region: Posterolateral Lumbosacral Target Area: The 6 o'clock position under the pedicle, on the affected side. Approach: Posterior Percutaneous Paravertebral approach. Level: L3 Level Laterality: Bilateral       Type: Minimal (Conscious) Anxiolysis combined with Local Anesthesia Indication(s): Analgesia and Anxiety Route: Intravenous (IV) IV Access: Secured Sedation: Meaningful verbal contact was maintained at all times during the procedure  Local Anesthetic: Lidocaine 1-2%  Position: Prone   Indications: 1. 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 DDD (degenerative disc disease), lumbosacral   3. Annular tear of lumbar disc (L3-4) (Left)   4. 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 Chronic lower extremity pain (1ry area of Pain) (Bilateral)   6. 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  Chronic anticoagulation (Eliquis)    Pain Score: Pre-procedure: 10-Worst pain ever/10 Post-procedure: 0-No pain/10   Pre-op H&P Assessment:  Jamie Phillips is a 80 y.o. (year old), female patient, seen today for interventional treatment. She  has a past surgical history that includes neck fusion; Wrist surgery (Left, 2000); Cardiac catheterization (N/A, 08/15/2015); IVC FILTER INSERTION (N/A, 06/11/2016); Colonoscopy; Joint replacement (Left, 06/17/2016); Colonoscopy with propofol (N/A, 03/10/2017); IVC FILTER REMOVAL (N/A, 05/06/2017); laparoscopy (N/A, 10/10/2018); laparoscopic appendectomy (N/A, 10/10/2018); Partial colectomy (Right, 10/10/2018); Appendectomy; Esophagogastroduodenoscopy (egd) with propofol (N/A, 02/02/2019); and Breast biopsy (Left, 08/19/04). Jamie Phillips has a current medication list which includes the following prescription(s): acetaminophen, albuterol, cholestyramine, duloxetine, eliquis, escitalopram, gabapentin, losartan-hydrochlorothiazide, multiple vitamins-minerals, oxycodone, pravastatin, sucralfate, and pantoprazole, and the following Facility-Administered Medications: midazolam. Her primarily concern today is the Back Pain  Initial Vital Signs:  Pulse/HCG Rate: (!) 57ECG Heart Rate: (!) 53 Temp: (!) 97.3 F (36.3 C) Resp: 18 BP: 124/80 SpO2: 95 %  BMI: Estimated body mass index is 43.1 kg/m as calculated from the following:   Height as of this encounter: 5\' 5"  (1.651 m).   Weight as of this encounter: 259 lb (117.5 kg).  Risk Assessment: Allergies: Reviewed. She has No Known Allergies.  Allergy Precautions: None required Coagulopathies: Reviewed. None identified.  Blood-thinner therapy: None at this time Active Infection(s): Reviewed. None identified. Jamie Phillips is afebrile  Site Confirmation: Jamie Phillips was asked to confirm the procedure and laterality before marking the site Procedure checklist: Completed Consent: Before the procedure and under the  influence of no sedative(s), amnesic(s), or anxiolytics, the patient was informed of the treatment options, risks and possible complications. To fulfill our ethical and legal obligations, as recommended by the American Medical Association's Code of Ethics, I  have informed the patient of my clinical impression; the nature and purpose of the treatment or procedure; the risks, benefits, and possible complications of the intervention; the alternatives, including doing nothing; the risk(s) and benefit(s) of the alternative treatment(s) or procedure(s); and the risk(s) and benefit(s) of doing nothing. The patient was provided information about the general risks and possible complications associated with the procedure. These may include, but are not limited to: failure to achieve desired goals, infection, bleeding, organ or nerve damage, allergic reactions, paralysis, and death. In addition, the patient was informed of those risks and complications associated to Spine-related procedures, such as failure to decrease pain; infection (i.e.: Meningitis, epidural or intraspinal abscess); bleeding (i.e.: epidural hematoma, subarachnoid hemorrhage, or any other type of intraspinal or peri-dural bleeding); organ or nerve damage (i.e.: Any type of peripheral nerve, nerve root, or spinal cord injury) with subsequent damage to sensory, motor, and/or autonomic systems, resulting in permanent pain, numbness, and/or weakness of one or several areas of the body; allergic reactions; (i.e.: anaphylactic reaction); and/or death. Furthermore, the patient was informed of those risks and complications associated with the medications. These include, but are not limited to: allergic reactions (i.e.: anaphylactic or anaphylactoid reaction(s)); adrenal axis suppression; blood sugar elevation that in diabetics may result in ketoacidosis or comma; water retention that in patients with history of congestive heart failure may result in shortness of  breath, pulmonary edema, and decompensation with resultant heart failure; weight gain; swelling or edema; medication-induced neural toxicity; particulate matter embolism and blood vessel occlusion with resultant organ, and/or nervous system infarction; and/or aseptic necrosis of one or more joints. Finally, the patient was informed that Medicine is not an exact science; therefore, there is also the possibility of unforeseen or unpredictable risks and/or possible complications that may result in a catastrophic outcome. The patient indicated having understood very clearly. We have given the patient no guarantees and we have made no promises. Enough time was given to the patient to ask questions, all of which were answered to the patient's satisfaction. Ms. Roussin has indicated that she wanted to continue with the procedure. Attestation: I, the ordering provider, attest that I have discussed with the patient the benefits, risks, side-effects, alternatives, likelihood of achieving goals, and potential problems during recovery for the procedure that I have provided informed consent. Date  Time: 11/07/2020  9:17 AM  Pre-Procedure Preparation:  Monitoring: As per clinic protocol. Respiration, ETCO2, SpO2, BP, heart rate and rhythm monitor placed and checked for adequate function Safety Precautions: Patient was assessed for positional comfort and pressure points before starting the procedure. Time-out: I initiated and conducted the "Time-out" before starting the procedure, as per protocol. The patient was asked to participate by confirming the accuracy of the "Time Out" information. Verification of the correct person, site, and procedure were performed and confirmed by me, the nursing staff, and the patient. "Time-out" conducted as per Joint Commission's Universal Protocol (UP.01.01.01). Time: 1014  Description of Procedure:          Area Prepped: Entire Posterior Lumbosacral Area DuraPrep (Iodine Povacrylex  [0.7% available iodine] and Isopropyl Alcohol, 74% w/w) Safety Precautions: Aspiration looking for blood return was conducted prior to all injections. At no point did we inject any substances, as a needle was being advanced. No attempts were made at seeking any paresthesias. Safe injection practices and needle disposal techniques used. Medications properly checked for expiration dates. SDV (single dose vial) medications used. Description of the Procedure: Protocol guidelines were followed. The patient  was placed in position over the procedure table. The target area was identified and the area prepped in the usual manner. Skin & deeper tissues infiltrated with local anesthetic. Appropriate amount of time allowed to pass for local anesthetics to take effect. The procedure needles were then advanced to the target area. Proper needle placement secured. Negative aspiration confirmed. Solution injected in intermittent fashion, asking for systemic symptoms every 0.5cc of injectate. The needles were then removed and the area cleansed, making sure to leave some of the prepping solution back to take advantage of its long term bactericidal properties.  Vitals:   11/07/20 1022 11/07/20 1032 11/07/20 1042 11/07/20 1053  BP: 133/82 130/62 114/77 120/74  Pulse:      Resp: 20 17 16 15   Temp:  (!) 97.3 F (36.3 C)  (!) 97.4 F (36.3 C)  TempSrc:      SpO2: 94% 94% 95% 94%  Weight:      Height:        Start Time: 1014 hrs. End Time: 1022 hrs.  Materials:  Needle(s) Type: Spinal Needle Gauge: 22G Length: 3.5-in Medication(s): Please see orders for medications and dosing details.  Imaging Guidance (Spinal):          Type of Imaging Technique: Fluoroscopy Guidance (Spinal) Indication(s): Assistance in needle guidance and placement for procedures requiring needle placement in or near specific anatomical locations not easily accessible without such assistance. Exposure Time: Please see nurses  notes. Contrast: Before injecting any contrast, we confirmed that the patient did not have an allergy to iodine, shellfish, or radiological contrast. Once satisfactory needle placement was completed at the desired level, radiological contrast was injected. Contrast injected under live fluoroscopy. No contrast complications. See chart for type and volume of contrast used. Fluoroscopic Guidance: I was personally present during the use of fluoroscopy. "Tunnel Vision Technique" used to obtain the best possible view of the target area. Parallax error corrected before commencing the procedure. "Direction-depth-direction" technique used to introduce the needle under continuous pulsed fluoroscopy. Once target was reached, antero-posterior, oblique, and lateral fluoroscopic projection used confirm needle placement in all planes. Images permanently stored in EMR. Interpretation: I personally interpreted the imaging intraoperatively. Adequate needle placement confirmed in multiple planes. Appropriate spread of contrast into desired area was observed. No evidence of afferent or efferent intravascular uptake. No intrathecal or subarachnoid spread observed. Permanent images saved into the patient's record.  Antibiotic Prophylaxis:   Anti-infectives (From admission, onward)    None      Indication(s): None identified  Post-operative Assessment:  Post-procedure Vital Signs:  Pulse/HCG Rate: (!) 57(!) 59 Temp: (!) 97.4 F (36.3 C) Resp: 15 BP: 120/74 SpO2: 94 %  EBL: None  Complications: No immediate post-treatment complications observed by team, or reported by patient.  Note: The patient tolerated the entire procedure well. A repeat set of vitals were taken after the procedure and the patient was kept under observation following institutional policy, for this type of procedure. Post-procedural neurological assessment was performed, showing return to baseline, prior to discharge. The patient was provided  with post-procedure discharge instructions, including a section on how to identify potential problems. Should any problems arise concerning this procedure, the patient was given instructions to immediately contact us, at any time, without hesitation. In any case, we plan to contact the patient by telephone for a follow-up status report regarding this interventional procedure.  Comments:  No additional relevant information.  Plan of Care  Orders:  Orders Placed This Encounter  Procedures  Lumbar Transforaminal Epidural    Scheduling Instructions:     Side: Bilateral     Level: L3-4     Sedation: Patient's choice.     Timeframe: Today    Order Specific Question:   Where will this procedure be performed?    Answer:   [facility]   [facility] C-ARM 1-60 MIN NO REPORT    Intraoperative interpretation by procedural physician at [place].    Standing Status:   Standing    Number of Occurrences:   1    Order Specific Question:   Reason for exam:    Answer:   Assistance in needle guidance and placement for procedures requiring needle placement in or near specific anatomical locations not easily accessible without such assistance.   Informed Consent Details: Physician/Practitioner Attestation; Transcribe to consent form and obtain patient signature    Provider Attestation: I, Miranda Dossie Arbour, MD, (Pain Management Specialist), the physician/practitioner, attest that I have discussed with the patient the benefits, risks, side effects, alternatives, likelihood of achieving goals and potential problems during recovery for the procedure that I have provided informed consent.    Scheduling Instructions:     Note: Always confirm laterality of pain with Ms. Hardin Negus, before procedure.     Transcribe to consent form and obtain patient signature.    Order Specific Question:   Physician/Practitioner attestation of informed consent for procedure/surgical case    Answer:   I,  the physician/practitioner, attest that I have discussed with the patient the benefits, risks, side effects, alternatives, likelihood of achieving goals and potential problems during recovery for the procedure that I have provided informed consent.    Order Specific Question:   Procedure    Answer:   Diagnostic lumbar transforaminal epidural steroid injection under fluoroscopic guidance. (See notes for level and laterality.)    Order Specific Question:   Physician/Practitioner performing the procedure    Answer:   Vaunda Gutterman A. 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    Please confirm that the patient has stopped the Eliquis (Apixaban) x 3 days prior to procedure or surgery.    Standing Status:   Standing    Number of Occurrences:   1   Provide equipment / supplies at bedside    "Block Tray" (Disposable  single use) Needle type: SpinalSpinal Amount/quantity: 2 Size: Medium (5-inch) Gauge: 22G    Standing Status:   Standing    Number of Occurrences:   1    Order Specific Question:   Specify    Answer:   Block Tray   Bleeding precautions    Standing Status:   Standing    Number of Occurrences:   1    Chronic Opioid Analgesic:  Tramadol 50 mg, 1 tab p.o. 4 times daily (200 mg/day of tramadol) MME/day: 20 mg/day   Medications ordered for procedure: Meds ordered this encounter  Medications   iohexol (OMNIPAQUE) 180 MG/ML injection 10 mL    Must be Myelogram-compatible. If not available, you may substitute with a water-soluble, non-ionic, hypoallergenic, myelogram-compatible radiological contrast medium.   lidocaine (XYLOCAINE) 2 % (with pres) injection 400 mg   lactated ringers infusion 1,000 mL   midazolam (VERSED) 5 MG/5ML injection 1-2 mg    Make sure  Flumazenil is available in the pyxis when using this medication. If oversedation occurs, administer 0.2 mg IV over 15 sec. If after 45 sec no response, administer 0.2 mg again over 1 min; may repeat at 1 min intervals; not to exceed 4 doses (1 mg)   sodium chloride flush (NS) 0.9 % injection 2 mL   ropivacaine (PF) 2 mg/mL (0.2%) (NAROPIN) injection 2 mL   dexamethasone (DECADRON) injection 20 mg    Medications administered: We administered iohexol, lidocaine, lactated ringers, midazolam, sodium chloride flush, ropivacaine (PF) 2 mg/mL (0.2%), and dexamethasone.  See the medical record for exact dosing, route, and time of administration.  Follow-up plan:   Return in about 2 weeks (around 11/21/2020) for procedure day (afternoon F2F) (PPE).      Interventional Therapies  Risk  Complexity Considerations:   Estimated body mass index is 43.6 kg/m as calculated from the following:   Height as of this encounter: 5\' 5"  (1.651 m).   Weight as of this encounter: 262 lb (118.8 kg). NOTE: Eliquis Anticoagulation: (Stop: 3 days  Restart: 6 hrs)   Planned  Pending:      Under consideration:   Diagnostic midline T12-L1 LESI    Completed:   Diagnostic bilateral lumbar facet block x1 (08/15/2020)  Diagnostic bilateral L3 TFESI x1 (07/11/2020) (100/100/100/100% relief of the bilateral L1/L2 radiculitis)  Diagnostic right L2-3 LESI x1 (07/11/2020) (100/100/100/100% relief of the bilateral L1/L2 radiculitis)    Therapeutic  Palliative (PRN) options:   Palliative/therapeutic bilateral L3 TFESI #2 + right L2-3 LESI #2      Recent Visits Date Type Provider Dept  10/18/20 Telemedicine Milinda Pointer, MD [facility]  10/05/20 Procedure visit Milinda Pointer, MD [facility]  09/21/20 Telemedicine Milinda Pointer, MD [facility]  09/05/20 Procedure visit Milinda Pointer, MD [facility]  08/24/20 Office Visit Milinda Pointer, MD [facility]  08/15/20 Procedure visit Milinda Pointer, MD [facility]  Showing recent visits within past 90 days and meeting all other requirements Today's Visits Date Type Provider Dept  11/07/20 Procedure visit Milinda Pointer, MD [facility]  Showing today's visits and meeting all other requirements Future Appointments Date Type Provider Dept  11/15/20 Appointment Milinda Pointer, MD [facility]  11/30/20 Appointment Milinda Pointer, MD [facility]  Showing future appointments within next 90 days and meeting all other requirements Disposition: Discharge home  Discharge (Date  Time): 11/07/2020; 1054 hrs.   Primary Care Physician: Kirk Ruths, MD Location: [facility] Note by: Gaspar Cola, MD Date: 11/07/2020; Time: 12:29 PM  Disclaimer:  Medicine is not an Chief Strategy Officer. The only guarantee in medicine is that nothing is guaranteed. It is important to note that the decision to proceed with this intervention was based on the information collected from the patient. The Data and conclusions were drawn from the patient's questionnaire, the interview, and the physical examination. Because the information was provided in large part by the patient, it cannot be guaranteed that it has not been purposely or unconsciously manipulated. Every effort has been made to obtain as much relevant data as possible for this evaluation. It is important to note that the conclusions that lead to this procedure are derived in large part from the available data. Always take into account that the treatment will also be dependent on availability of resources and existing treatment guidelines, considered by other Pain Management Practitioners as being common knowledge and practice, at the time  of the intervention. For Medico-Legal purposes, it is also important to point out that variation in procedural techniques and  pharmacological choices are the acceptable norm. The indications, contraindications, technique, and results of the above procedure should only be interpreted and judged by a Board-Certified Interventional Pain Specialist with extensive familiarity and expertise in the same exact procedure and technique.

## 2020-11-07 ENCOUNTER — Ambulatory Visit (HOSPITAL_BASED_OUTPATIENT_CLINIC_OR_DEPARTMENT_OTHER): Payer: Medicare HMO | Admitting: Pain Medicine

## 2020-11-07 ENCOUNTER — Ambulatory Visit
Admission: RE | Admit: 2020-11-07 | Discharge: 2020-11-07 | Disposition: A | Discharge status: Home / Self Care | Payer: Medicare HMO | Source: Ambulatory Visit | Attending: Pain Medicine | Admitting: Pain Medicine

## 2020-11-07 ENCOUNTER — Encounter: Payer: Self-pay | Admitting: Pain Medicine

## 2020-11-07 VITALS — BP 120/74 | HR 57 | Temp 97.4°F | Resp 15 | Ht 65.0 in | Wt 259.0 lb

## 2020-11-07 DIAGNOSIS — M4316 Spondylolisthesis, lumbar region: Secondary | ICD-10-CM | POA: Insufficient documentation

## 2020-11-07 DIAGNOSIS — M5442 Lumbago with sciatica, left side: Secondary | ICD-10-CM | POA: Diagnosis not present

## 2020-11-07 DIAGNOSIS — M5137 Other intervertebral disc degeneration, lumbosacral region: Secondary | ICD-10-CM | POA: Insufficient documentation

## 2020-11-07 DIAGNOSIS — M5440 Lumbago with sciatica, unspecified side: Secondary | ICD-10-CM | POA: Diagnosis not present

## 2020-11-07 DIAGNOSIS — M5136 Other intervertebral disc degeneration, lumbar region: Secondary | ICD-10-CM | POA: Insufficient documentation

## 2020-11-07 DIAGNOSIS — M48061 Spinal stenosis, lumbar region without neurogenic claudication: Secondary | ICD-10-CM | POA: Diagnosis not present

## 2020-11-07 DIAGNOSIS — M79604 Pain in right leg: Secondary | ICD-10-CM | POA: Diagnosis not present

## 2020-11-07 DIAGNOSIS — M5441 Lumbago with sciatica, right side: Secondary | ICD-10-CM | POA: Diagnosis not present

## 2020-11-07 DIAGNOSIS — M5116 Intervertebral disc disorders with radiculopathy, lumbar region: Secondary | ICD-10-CM | POA: Diagnosis not present

## 2020-11-07 DIAGNOSIS — M2540 Effusion, unspecified joint: Secondary | ICD-10-CM

## 2020-11-07 DIAGNOSIS — M4317 Spondylolisthesis, lumbosacral region: Secondary | ICD-10-CM

## 2020-11-07 DIAGNOSIS — Z7901 Long term (current) use of anticoagulants: Secondary | ICD-10-CM

## 2020-11-07 DIAGNOSIS — M79605 Pain in left leg: Secondary | ICD-10-CM | POA: Insufficient documentation

## 2020-11-07 DIAGNOSIS — G8929 Other chronic pain: Secondary | ICD-10-CM | POA: Insufficient documentation

## 2020-11-07 DIAGNOSIS — M7138 Other bursal cyst, other site: Secondary | ICD-10-CM

## 2020-11-07 LAB — NM MYOCAR MULTI W/SPECT W/WALL MOTION / EF
Estimated workload: 1 METS
Exercise duration (min): 1 min
Exercise duration (sec): 17 s
LV dias vol: 97 mL (ref 46–106)
LV sys vol: 36 mL
MPHR: 65 {beats}/min
Peak HR: 65 {beats}/min
Percent HR: 45 %
Rest HR: 57 {beats}/min
SDS: 2
SRS: 6
SSS: 5
TID: 1.02

## 2020-11-07 MED ORDER — DEXAMETHASONE SODIUM PHOSPHATE 10 MG/ML IJ SOLN
20.0000 mg | Freq: Once | INTRAMUSCULAR | Status: AC
  Administered 2020-11-07: 20 mg
  Filled 2020-11-07: qty 2

## 2020-11-07 MED ORDER — MIDAZOLAM HCL 5 MG/5ML IJ SOLN
1.0000 mg | INTRAMUSCULAR | Status: DC | PRN
  Administered 2020-11-07: 2 mg via INTRAVENOUS
  Filled 2020-11-07: qty 5

## 2020-11-07 MED ORDER — SODIUM CHLORIDE (PF) 0.9 % IJ SOLN
INTRAMUSCULAR | Status: AC
  Filled 2020-11-07: qty 10

## 2020-11-07 MED ORDER — LIDOCAINE HCL (PF) 2 % IJ SOLN
INTRAMUSCULAR | Status: AC
  Filled 2020-11-07: qty 10

## 2020-11-07 MED ORDER — IOHEXOL 180 MG/ML  SOLN
10.0000 mL | Freq: Once | INTRAMUSCULAR | Status: AC
  Administered 2020-11-07: 10 mL via INTRATHECAL
  Filled 2020-11-07: qty 20

## 2020-11-07 MED ORDER — ROPIVACAINE HCL 2 MG/ML IJ SOLN
2.0000 mL | Freq: Once | INTRAMUSCULAR | Status: AC
  Administered 2020-11-07: 4 mL via EPIDURAL

## 2020-11-07 MED ORDER — ROPIVACAINE HCL 2 MG/ML IJ SOLN
INTRAMUSCULAR | Status: AC
  Filled 2020-11-07: qty 20

## 2020-11-07 MED ORDER — LIDOCAINE HCL 2 % IJ SOLN
20.0000 mL | Freq: Once | INTRAMUSCULAR | Status: AC
  Administered 2020-11-07: 200 mg

## 2020-11-07 MED ORDER — LACTATED RINGERS IV SOLN
1000.0000 mL | Freq: Once | INTRAVENOUS | Status: AC
  Administered 2020-11-07: 1000 mL via INTRAVENOUS

## 2020-11-07 MED ORDER — SODIUM CHLORIDE 0.9% FLUSH
2.0000 mL | Freq: Once | INTRAVENOUS | Status: AC
  Administered 2020-11-07: 4 mL

## 2020-11-07 NOTE — Progress Notes (Signed)
Safety precautions to be maintained throughout the outpatient stay will include: orient to surroundings, keep bed in low position, maintain call bell within reach at all times, provide assistance with transfer out of bed and ambulation.  

## 2020-11-07 NOTE — Patient Instructions (Signed)

## 2020-11-08 ENCOUNTER — Telehealth: Payer: Self-pay

## 2020-11-08 ENCOUNTER — Other Ambulatory Visit: Payer: Self-pay

## 2020-11-08 ENCOUNTER — Ambulatory Visit
Admission: RE | Admit: 2020-11-08 | Discharge: 2020-11-08 | Disposition: A | Discharge status: Home / Self Care | Payer: Medicare HMO | Source: Ambulatory Visit | Attending: Student | Admitting: Student

## 2020-11-08 DIAGNOSIS — N189 Chronic kidney disease, unspecified: Secondary | ICD-10-CM | POA: Insufficient documentation

## 2020-11-08 DIAGNOSIS — I130 Hypertensive heart and chronic kidney disease with heart failure and stage 1 through stage 4 chronic kidney disease, or unspecified chronic kidney disease: Secondary | ICD-10-CM | POA: Diagnosis not present

## 2020-11-08 DIAGNOSIS — I5020 Unspecified systolic (congestive) heart failure: Secondary | ICD-10-CM

## 2020-11-08 DIAGNOSIS — I208 Other forms of angina pectoris: Secondary | ICD-10-CM | POA: Insufficient documentation

## 2020-11-08 DIAGNOSIS — I200 Unstable angina: Secondary | ICD-10-CM | POA: Diagnosis not present

## 2020-11-08 DIAGNOSIS — Z86711 Personal history of pulmonary embolism: Secondary | ICD-10-CM | POA: Diagnosis not present

## 2020-11-08 DIAGNOSIS — I2089 Other forms of angina pectoris: Secondary | ICD-10-CM

## 2020-11-08 LAB — ECHOCARDIOGRAM COMPLETE
AR max vel: 1.4 cm2
AV Area VTI: 1.48 cm2
AV Area mean vel: 1.37 cm2
AV Mean grad: 5.7 mmHg
AV Peak grad: 11.5 mmHg
Ao pk vel: 1.7 m/s
Area-P 1/2: 3.68 cm2
S' Lateral: 3.34 cm

## 2020-11-08 NOTE — Progress Notes (Signed)
*  PRELIMINARY RESULTS* Echocardiogram 2D Echocardiogram has been performed.  Jamie Phillips 11/08/2020, 11:34 AM

## 2020-11-08 NOTE — Telephone Encounter (Signed)
Post procedure phone call. Patient states she is doing good.  

## 2020-11-13 NOTE — Progress Notes (Signed)
PROVIDER NOTE: Information contained herein reflects review and annotations entered in association with encounter. Interpretation of such information and data should be left to medically-trained personnel. Information provided to patient can be located elsewhere in the medical record under "Patient Instructions". Document created using STT-dictation technology, any transcriptional errors that may result from process are unintentional.    Patient: Jamie Phillips  Service Category: E/M  Provider: Gaspar Cola, MD  DOB: [birth year]  DOS: 11/15/2020  Specialty: Interventional Pain Management  MRN: [id number]  Setting: Ambulatory outpatient  PCP: Kirk Ruths, MD  Type: Established Patient    Referring Provider: Kirk Ruths, MD  Location: Office  Delivery: Face-to-face     HPI  Ms. Jamie Phillips, a 80 y.o. year old female, is here today because of her Chronic hip pain, bilateral [M25.551, M25.552, G89.29]. [name] primary complain today is Leg Pain (Bilateral, right is worse today. ) Last encounter: My last encounter with her was on 11/07/2020. Pertinent problems: 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); Abnormal MRI, lumbar spine (01/24/2020); Lumbar facet syndrome (Multilevel) (Bilateral); Lumbar facet hypertrophy (Multilevel) (Bilateral); Severe muscle deconditioning;  Lower extremity weakness (Bilateral); Lumbosacral facet hypertrophy (Multilevel) (Bilateral); Lumbar scoliosis (idiopathic); Spondylolisthesis of lumbosacral region (Multilevel); Retrolisthesis (L1/L2); Anterolisthesis of lumbar spine (L3/L4, L4/L5); Anterolisthesis of lumbosacral spine (L5/S1); Lumbar lateral recess stenosis (Bilateral: L3-4, L4-5) (Severe); Lumbar foraminal stenosis (Right: L4-5); Synovial cyst of lumbar facet joint (L3-4) (Right); Arthropathy of spinal facet joint concurrent with and due to effusion (L3-4); Annular tear of lumbar disc (L3-4) (Left); Chronic lower extremity radicular pain (L1/L2 dermatomes) (Bilateral); Chronic groin pain (Bilateral); Neurogenic pain, lower extremity (Bilateral); Shooting pain, intermittent (electrical-like); History of total knee replacement (Bilateral) ; Spondylosis without myelopathy or radiculopathy, lumbosacral region; Chronic low back pain (Bilateral) w/o sciatica; EMG (electromyogram) abnormalities (05/09/2020); Abnormal CT scan, lumbar spine (06/16/2020); Chronic hip pain (Bilateral); Trochanteric bursitis of hips (Bilateral); Iliopsoas bursitis of hips; Decreased range of motion of lumbar spine; and Osteoarthritis of hips (Bilateral) on their pertinent problem list. Pain Assessment: Severity of Chronic pain is reported as a 9 /10. Location: Leg Left, Right/pain goes from the bottom of the buttocks down the legs.. Onset: More than a month ago. Quality: Discomfort, Constant, Sharp, Aching. Timing: Constant. Modifying factor(s): not really sure.. Vitals:  height is '5\' 5"'  (1.651 m). Her temporal temperature is 96.8 F (36 C) (abnormal). Her blood pressure is 126/72 and her pulse is 66. Her respiration is 16 and oxygen saturation is 98%.   Reason for encounter: both, medication management and post-procedure assessment.   The patient indicates doing well with the current medication regimen. No adverse reactions or side effects reported to the medications.   The patient indicates that Dr. Ouida Sills changed her gabapentin to 400 mg p.o. 3 times daily.  She feels that this is helping with her pain, but she also has noticed that this makes her very sleepy.  In fact, when I went into the exam room today the patient was asleep while sitting  in the wheelchair.  At this time, I will let Dr. Ouida Sills to go over the gabapentin and I will not be worried about it anymore.  The patient returns today for follow-up after a bilateral L3 TFESI which seems to have provided her with a 100% ongoing relief of the low back pain and the lower extremity pain from the radicular symptoms.  However, she is currently unable to ambulate freely secondary to pain in the hip area and buttocks area, bilaterally with the right side being worse than the left.  Looking at her medical record, I could not find any recent x-rays/MRIs/CT scans of the hips.  However, I did find that Dr. Rosalia Hammers, DO had done bilateral intra-articular hip joint injections.  Apparently he did the left side on 08/24/2019 and the right side on 08/17/2019.  The patient indicates not knowing or remembering who Dr. Candelaria Stagers is more when was it that she had this injections, much less the results of those procedures.  Physical exam demonstrates the patient to be having bilateral hip joint arthralgia on the Patrick maneuver provocative test and arthropathy with decreased range of motion of both hips but left seems to be worse in terms of the range of motion.  However, the patient indicates that the right gives her more pain.  In addition, physical exam today demonstrated decreased range of motion of the lumbar spine with the patient walking around with the lumbar spine in a flexed position.  She continues to be morbidly obese.  The patient does have multilevel central lumbar spinal stenosis with neurogenic claudication which explains the reason why she feels better walking around with the lumbar spine flexed.  Today's plan  is to schedule the patient to return for a bilateral intra-articular hip joint injection and bilateral trochanteric bursa injection after having had x-rays done of the hip joints.  Depending on the findings and the results from the injections, we may have to look further into it by ordering an MRI of the hip joints.  RTCB: 02/15/2021  Post-Procedure Evaluation  Procedure (11/07/2020):  Type: Trans-Foraminal Epidural Steroid Injection          Purpose: Diagnostic/Therapeutic Region: Posterolateral Lumbosacral Target Area: The 6 o'clock position under the pedicle, on the affected side. Approach: Posterior Percutaneous Paravertebral approach. Level: L3 Level Laterality: Bilateral    Pre-procedure pain level: 10/10 Post-procedure: 0/10 (100% relief)  Anxiolysis: Minimal conscious anxiolysis  Effectiveness during initial hour after procedure (Ultra-Short Term Relief): 100 %.  Local anesthetic used: Long-acting (4-6 hours) Effectiveness: Defined as any analgesic benefit obtained secondary to the administration of local anesthetics. This carries significant diagnostic value as to the etiological location, or anatomical origin, of the pain. Duration of benefit is expected to coincide with the duration of the local anesthetic used.  Effectiveness during initial 4-6 hours after procedure (Short-Term Relief): 100 %.  Long-term benefit: Defined as any relief past the pharmacologic duration of the local anesthetics.  Effectiveness past the initial 6 hours after procedure (Long-Term Relief): 100 % (pain relief lasted a couple of days and then returned in the legs.  Back is better, but she feels that the legs are worse.).  Benefits, current: Defined as benefit present at the time of this evaluation.   Analgesia:   The patient indicates having attained an ongoing 100% relief of the low back pain.  However, she thinks that since she had the procedure done, her legs have gotten worse.  Evaluation today  reveals that she is having bilateral  hip joint arthralgia and not necessarily pain coming from the back.  This means that the bilateral L3 transforaminal ESI provide her with complete relief of her low back and lower extremity radicular symptoms that seems to be ongoing at this time. Function:  Restricted, but mainly due to her hip problems and not the low back. ROM:  Although she is currently not complaining of the low back pain, today's physical exam demonstrated significant decreased range of motion of her lumbar spine.  Pharmacotherapy Assessment  Analgesic: Tramadol 50 mg, 1 tab p.o. 4 times daily (200 mg/day of tramadol) MME/day: 20 mg/day   Monitoring: [place] PMP: PDMP reviewed during this encounter.       Pharmacotherapy: No side-effects or adverse reactions reported. Compliance: No problems identified. Effectiveness: Clinically acceptable.  Janett Billow, RN  11/15/2020 10:45 AM  Sign when Signing Visit Nursing Pain Medication Assessment:  Safety precautions to be maintained throughout the outpatient stay will include: orient to surroundings, keep bed in low position, maintain call bell within reach at all times, provide assistance with transfer out of bed and ambulation.  Medication Inspection Compliance: Pill count conducted under aseptic conditions, in front of the patient. Neither the pills nor the bottle was removed from the patient's sight at any time. Once count was completed pills were immediately returned to the patient in their original bottle.  Medication: Oxycodone IR Pill/Patch Count:  18 of 65 pills remain Pill/Patch Appearance: Markings consistent with prescribed medication Bottle Appearance: Standard pharmacy container. Clearly labeled. Filled Date: 06 / 29 / 2022 Last Medication intake:  Today    UDS:  Summary  Date Value Ref Range Status  07/27/2020 Note  Final    Comment:    ==================================================================== [employer]  Select 13 (MW) ==================================================================== Test                             Result       Flag       Units  Drug Present and Declared for Prescription Verification   Oxycodone                      1206         EXPECTED   ng/mg creat   Oxymorphone                    90           EXPECTED   ng/mg creat   Noroxycodone                   2368         EXPECTED   ng/mg creat    Sources of oxycodone include scheduled prescription medications.    Oxymorphone and noroxycodone are expected metabolites of oxycodone.    Oxymorphone is also available as a scheduled prescription medication.  ==================================================================== Test                      Result    Flag   Units      Ref Range   Creatinine              212              mg/dL      >=20 ==================================================================== Declared Medications:  The flagging and interpretation on this report are based on the  following declared medications.  Unexpected results may arise from  inaccuracies in the declared medications.   **Note: The testing scope of this panel includes these medications:   Oxycodone   **Note: The testing scope of this panel does not include the  following reported medications:   Acetaminophen (Tylenol)  Albuterol (Ventolin HFA)  Apixaban (Eliquis)  Cholestyramine (Questran)  Duloxetine (Cymbalta)  Escitalopram (Lexapro)  Gabapentin (Neurontin)  Hydrochlorothiazide (Hyzaar)  Losartan (Hyzaar)  Pantoprazole (Protonix)  Pravastatin (Pravachol)  Sucralfate (Carafate) ==================================================================== For clinical consultation, please call [phone]. ====================================================================      ROS  Constitutional: Denies any fever or chills Gastrointestinal: No reported hemesis, hematochezia, vomiting, or acute GI distress Musculoskeletal:  Denies any acute onset joint swelling, redness, loss of ROM, or weakness Neurological: No reported episodes of acute onset apraxia, aphasia, dysarthria, agnosia, amnesia, paralysis, loss of coordination, or loss of consciousness  Medication Review  DULoxetine, Multiple Vitamins-Minerals, acetaminophen, albuterol, apixaban, cholestyramine, escitalopram, gabapentin, losartan-hydrochlorothiazide, oxyCODONE, pantoprazole, pravastatin, propranolol, and sucralfate  History Review  Allergy: Ms. Gasaway has No Known Allergies. Drug: Ms. Gramajo  reports no history of drug use. Alcohol:  reports no history of alcohol use. Tobacco:  reports that she has never smoked. She has never used smokeless tobacco. Social: Ms. Finerty  reports that she has never smoked. She has never used smokeless tobacco. She reports that she does not drink alcohol and does not use drugs. 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 Surgical: Ms. Firebaugh  has a past surgical history that includes neck fusion; Wrist surgery (Left, 2000); Cardiac catheterization (N/A, 08/15/2015); IVC FILTER INSERTION (N/A, 06/11/2016); Colonoscopy; Joint replacement (Left, 06/17/2016); Colonoscopy with propofol (N/A, 03/10/2017); IVC FILTER REMOVAL (N/A, 05/06/2017); laparoscopy (N/A, 10/10/2018); laparoscopic appendectomy (N/A, 10/10/2018); Partial colectomy (Right, 10/10/2018); Appendectomy; Esophagogastroduodenoscopy (egd) with  propofol (N/A, 02/02/2019); and Breast biopsy (Left, 08/19/04). Family: family history includes Congestive Heart Failure in her father; Heart failure in her father; Pulmonary embolism in her mother.  Laboratory Chemistry Profile   Renal Lab Results  Component Value Date   BUN 19 09/15/2020   CREATININE 0.98 09/15/2020   BCR 17 06/21/2020   GFRAA 55 (L) 09/03/2019   GFRNONAA 59 (L) 09/15/2020    Hepatic Lab Results  Component Value Date   AST 16 09/15/2020   ALT 16 09/15/2020   ALBUMIN 3.5 09/15/2020   ALKPHOS 83 09/15/2020   LIPASE 37 10/09/2018    Electrolytes Lab Results  Component Value Date   NA 142 09/15/2020   K 3.7 09/15/2020   CL 104 09/15/2020   CALCIUM 8.7 (L) 09/15/2020   MG 1.9 06/21/2020    Bone Lab Results  Component Value Date   25OHVITD1 14 (L) 06/21/2020   25OHVITD2 <1.0 06/21/2020   25OHVITD3 14 06/21/2020    Inflammation (CRP: Acute Phase) (ESR: Chronic Phase) Lab Results  Component Value Date   CRP 9 06/21/2020   ESRSEDRATE 21 06/21/2020   LATICACIDVEN 0.9 12/13/2018         Note: Above Lab results reviewed.  Recent Imaging Review  ECHOCARDIOGRAM COMPLETE    ECHOCARDIOGRAM REPORT       Patient Name:   MYNA FREIMARK Date of Exam: 11/08/2020 Medical Rec #:  [id number]       Height:       65.0 in Accession #:    [id number]      Weight:       259.0 lb Date of Birth:  02/15/1941       BSA:          2.208 m Patient Age:    43 years        BP:           120/74 mmHg Patient Gender: F               HR:           59 bpm. 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 Risk Factors:Hypertension. CKD, pulmonary                  embolism.   Sonographer:     Sherrie Sport RDCS (AE) Referring  Phys:  [id number] [facility] Diagnosing Phys: Serafina Royals MD    Sonographer Comments: Suboptimal apical window and no subcostal window. IMPRESSIONS   1. Left ventricular ejection fraction, by estimation, is 50 to 55%. The left ventricle has low normal function. The left ventricle has no regional wall motion abnormalities. Left ventricular diastolic parameters were normal.  2. Right ventricular systolic function is normal. The right ventricular size is normal.  3. The mitral valve is normal in structure. Mild mitral valve regurgitation.  4. The aortic valve is normal in structure. Aortic valve regurgitation is not visualized.  FINDINGS  Left Ventricle: Left ventricular ejection fraction, by estimation, is 50 to 55%. The left ventricle has low normal function. The left ventricle has no regional wall motion abnormalities. The left ventricular internal cavity size was normal in size.  There is no left ventricular hypertrophy. Left ventricular diastolic parameters were normal.  Right Ventricle: The right ventricular size is normal. No increase in right ventricular wall thickness. Right ventricular systolic function is normal.  Left Atrium: Left atrial size was normal in size.  Right Atrium: Right atrial size was normal in size.  Pericardium: There is no evidence of pericardial effusion.  Mitral Valve: The mitral valve is normal in structure. Mild mitral valve regurgitation.  Tricuspid Valve: The tricuspid valve is normal in structure. Tricuspid valve regurgitation is trivial.  Aortic Valve: The aortic valve is normal in structure. Aortic valve regurgitation is not visualized. Aortic valve mean gradient measures 5.7 mmHg. Aortic valve peak gradient measures 11.5 mmHg. Aortic valve area, by VTI measures 1.48 cm.  Pulmonic Valve: The pulmonic valve was normal in structure. Pulmonic valve regurgitation is not visualized.  Aorta: The aortic root and ascending aorta are structurally normal,  with no evidence of dilitation.  IAS/Shunts: No atrial level shunt detected by color flow Doppler.    LEFT VENTRICLE PLAX 2D LVIDd:         4.55 cm  Diastology LVIDs:         3.34 cm  LV e' medial:    6.53 cm/s LV PW:  0.96 cm  LV E/e' medial:  13.9 LV IVS:        0.75 cm  LV e' lateral:   7.94 cm/s LVOT diam:     2.00 cm  LV E/e' lateral: 11.4 LV SV:         51 LV SV Index:   23 LVOT Area:     3.14 cm    RIGHT VENTRICLE RV Basal diam:  3.41 cm RV S prime:     15.80 cm/s TAPSE (M-mode): 4.2 cm  LEFT ATRIUM              Index       RIGHT ATRIUM           Index LA diam:        3.80 cm  1.72 cm/m  RA Area:     17.00 cm LA Vol (A2C):   110.0 ml 49.82 ml/m RA Volume:   40.00 ml  18.12 ml/m LA Vol (A4C):   65.6 ml  29.71 ml/m LA Biplane Vol: 87.8 ml  39.77 ml/m  AORTIC VALVE                    PULMONIC VALVE AV Area (Vmax):    1.40 cm     PV Vmax:        0.75 m/s AV Area (Vmean):   1.37 cm     PV Peak grad:   2.3 mmHg AV Area (VTI):     1.48 cm     RVOT Peak grad: 2 mmHg AV Vmax:           169.67 cm/s AV Vmean:          110.000 cm/s AV VTI:            0.347 m AV Peak Grad:      11.5 mmHg AV Mean Grad:      5.7 mmHg LVOT Vmax:         75.70 cm/s LVOT Vmean:        48.100 cm/s LVOT VTI:          0.163 m LVOT/AV VTI ratio: 0.47   AORTA Ao Root diam: 3.23 cm  MITRAL VALVE                TRICUSPID VALVE MV Area (PHT): 3.68 cm     TR Peak grad:   23.4 mmHg MV Decel Time: 206 msec     TR Vmax:        242.00 cm/s MV E velocity: 90.80 cm/s MV A velocity: 106.00 cm/s  SHUNTS MV E/A ratio:  0.86         Systemic VTI:  0.16 m                             Systemic Diam: 2.00 cm  Serafina Royals MD Electronically signed by Serafina Royals MD Signature Date/Time: 11/08/2020/1:19:37 PM      Final   Note: Reviewed        Physical Exam  General appearance: Well nourished, well developed, and well hydrated. In no apparent acute distress Mental status: Alert,  oriented x 3 (person, place, & time)       Respiratory: No evidence of acute respiratory distress Eyes: PERLA Vitals: BP 126/72 (BP Location: Right Arm, Patient Position: Sitting, Cuff Size: Large)   Pulse 66   Temp (!) 96.8 F (36 C) (Temporal)   Resp 16  Ht '5\' 5"'  (1.651 m)   SpO2 98%   BMI 43.10 kg/m  BMI: Estimated body mass index is 43.1 kg/m as calculated from the following:   Height as of this encounter: '5\' 5"'  (1.651 m).   Weight as of 11/07/20: 259 lb (117.5 kg). Ideal: Ideal body weight: 57 kg (125 lb 10.6 oz) Adjusted ideal body weight: 81.2 kg (179 lb)  Assessment   Status Diagnosis  Controlled Controlled Controlled 1. Chronic hip pain (Bilateral)   2. Chronic groin pain (Bilateral)   3. Chronic low back pain (Bilateral) w/o sciatica   4. Chronic lower extremity pain (1ry area of Pain) (Bilateral)   5. Chronic pain syndrome   6. Pharmacologic therapy   7. Chronic use of opiate for therapeutic purpose   8. Chronic anticoagulation (Eliquis)   9. Encounter for chronic pain management   10. Encounter for medication management   11. Trochanteric bursitis of hips (Bilateral)   12. Iliopsoas bursitis of hips   13. Decreased range of motion of lumbar spine   14. Osteoarthritis of hips (Bilateral)      Updated Problems: Problem  Chronic hip pain (Bilateral)  Trochanteric bursitis of hips (Bilateral)  Iliopsoas bursitis of hips  Decreased Range of Motion of Lumbar Spine  Osteoarthritis of hips (Bilateral)    Plan of Care  Problem-specific:  No problem-specific Assessment & Plan notes found for this encounter.  Ms. MARESA MORASH has a current medication list which includes the following long-term medication(s): albuterol, cholestyramine, duloxetine, eliquis, escitalopram, gabapentin, losartan-hydrochlorothiazide, pantoprazole, [START ON 11/17/2020] oxycodone, [START ON 12/17/2020] oxycodone, and [START ON 01/16/2021] oxycodone.  Pharmacotherapy (Medications  Ordered): Meds ordered this encounter  Medications   oxyCODONE (OXY IR/ROXICODONE) 5 MG immediate release tablet    Sig: Take 1 tablet (5 mg total) by mouth 3 (three) times daily as needed for severe pain. Must last 30 days.    Dispense:  65 tablet    Refill:  0    Not a duplicate. Do NOT delete! Dispense 1 day early if closed on fill date. Warn not to take CNS-depressants 8 hours before or after taking opioid. Do not send refill request. Renewal requires appointment.   oxyCODONE (OXY IR/ROXICODONE) 5 MG immediate release tablet    Sig: Take 1 tablet (5 mg total) by mouth 3 (three) times daily as needed for severe pain. Must last 30 days.    Dispense:  65 tablet    Refill:  0    Not a duplicate. Do NOT delete! Dispense 1 day early if closed on fill date. Warn not to take CNS-depressants 8 hours before or after taking opioid. Do not send refill request. Renewal requires appointment.   oxyCODONE (OXY IR/ROXICODONE) 5 MG immediate release tablet    Sig: Take 1 tablet (5 mg total) by mouth 3 (three) times daily as needed for severe pain. Must last 30 days.    Dispense:  65 tablet    Refill:  0    Not a duplicate. Do NOT delete! Dispense 1 day early if closed on fill date. Warn not to take CNS-depressants 8 hours before or after taking opioid. Do not send refill request. Renewal requires appointment.    Orders:  Orders Placed This Encounter  Procedures   HIP INJECTION    Standing Status:   Future    Standing Expiration Date:   02/15/2021    Scheduling Instructions:     Side: Bilateral     Sedation: No Sedation.  Timeframe: As soon as schedule allows   DG HIP UNILAT W OR W/O PELVIS 2-3 VIEWS RIGHT    Please describe any evidence of DJD, such as joint narrowing, asymmetry, cysts, or any anomalies in bone density, production, or erosion.    Standing Status:   Future    Standing Expiration Date:   12/16/2020    Scheduling Instructions:     Imaging must be done as soon as possible. Inform  patient that order will expire within 30 days and I will not renew it.    Order Specific Question:   Reason for Exam (SYMPTOM  OR DIAGNOSIS REQUIRED)    Answer:   Right hip pain/arthralgia    Order Specific Question:   Preferred imaging location?    Answer:   [facility]    Order Specific Question:   Call Results- Best Contact Number?    Answer:   [phone] ([facility])    Order Specific Question:   Release to patient    Answer:   Immediate   DG HIP UNILAT W OR W/O PELVIS 2-3 VIEWS LEFT    Please describe any evidence of DJD, such as joint narrowing, asymmetry, cysts, or any anomalies in bone density, production, or erosion.    Standing Status:   Future    Standing Expiration Date:   12/16/2020    Scheduling Instructions:     Imaging must be done as soon as possible. Inform patient that order will expire within 30 days and I will not renew it.    Order Specific Question:   Reason for Exam (SYMPTOM  OR DIAGNOSIS REQUIRED)    Answer:   Right hip pain/arthralgia    Order Specific Question:   Preferred imaging location?    Answer:   [facility]    Order Specific Question:   Call Results- Best Contact Number?    Answer:   [phone] ([facility])   Blood Thinner Instructions to Nursing    Always make sure patient has clearance from prescribing physician to stop blood thinners for interventional therapies. If the patient requires a Lovenox-bridge therapy, make sure arrangements are made to institute it with the assistance of the PCP.    Scheduling Instructions:     Have Ms. Troyer stop the Eliquis (Apixaban) x 3 days prior to procedure or surgery.   Informed Consent Details: Physician/Practitioner Attestation; Transcribe to consent form and obtain patient signature    Nursing Order: Transcribe to consent form and obtain patient signature. Note: Always confirm laterality of pain with Ms. Hardin Negus, before procedure.    Order Specific Question:    Physician/Practitioner attestation of informed consent for procedure/surgical case    Answer:   I, the physician/practitioner, attest that I have discussed with the patient the benefits, risks, side effects, alternatives, likelihood of achieving goals and potential problems during recovery for the procedure that I have provided informed consent.    Order Specific Question:   Procedure    Answer:   Hip injection    Order Specific Question:   Physician/Practitioner performing the procedure    Answer:   Saleem Coccia A. Dossie Arbour, MD    Order Specific Question:   Indication/Reason    Answer:   Hip Joint Pain (Arthralgia)    Follow-up plan:   Return for Procedure (no sedation): (B) Hip inj. #1 + (B) TBI #1, (Blood Thinner Protocol).     Interventional Therapies  Risk  Complexity Considerations:   Estimated body mass index is 43.6 kg/m as calculated  from the following:   Height as of this encounter: '5\' 5"'  (1.651 m).   Weight as of this encounter: 262 lb (118.8 kg). NOTE: Eliquis Anticoagulation: (Stop: 3 days  Restart: 6 hrs)   Planned  Pending:   Diagnostic bilateral IA hip joint injection #1  Diagnostic bilateral trochanteric bursa injection #1    Under consideration:   Diagnostic bilateral IA hip joint injection #1  Diagnostic bilateral trochanteric bursa injection #1  Diagnostic bilateral iliopsoas bursa injection #1  Diagnostic midline T12-L1 LESI    Completed:   Diagnostic bilateral lumbar facet block x1 (08/15/2020) (100/100/50/50)  Diagnostic bilateral L3 TFESI x2 (09/05/2020) (100/100/100/100% relief of the bilateral L1/L2 radiculitis)  Diagnostic right L2-3 LESI x1 (07/11/2020) (100/100/100/100% relief of the bilateral L1/L2 radiculitis)    Therapeutic  Palliative (PRN) options:   Therapeutic right L2-3 LESI #2  Therapeutic bilateral L3 transforaminal ESI #3  Diagnostic bilateral lumbar facet MBB #2       Recent Visits Date Type Provider Dept  11/07/20 Procedure visit  Milinda Pointer, MD [facility]  10/18/20 Telemedicine Milinda Pointer, MD [facility]  10/05/20 Procedure visit Milinda Pointer, MD [facility]  09/21/20 Telemedicine Milinda Pointer, MD [facility]  09/05/20 Procedure visit Milinda Pointer, MD [facility]  08/24/20 Office Visit Milinda Pointer, MD [facility]  Showing recent visits within past 90 days and meeting all other requirements Today's Visits Date Type Provider Dept  11/15/20 Office Visit Milinda Pointer, MD [facility]  Showing today's visits and meeting all other requirements Future Appointments Date Type Provider Dept  11/30/20 Appointment Milinda Pointer, MD [facility]  Showing future appointments within next 90 days and meeting all other requirements I discussed the assessment and treatment plan with the patient. The patient was provided an opportunity to ask questions and all were answered. The patient agreed with the plan and demonstrated an understanding of the instructions.  Patient advised to call back or seek an in-person evaluation if the symptoms or condition worsens.  Duration of encounter: 35 minutes.  Note by: Gaspar Cola, MD Date: 11/15/2020; Time: 11:47 AM

## 2020-11-15 ENCOUNTER — Ambulatory Visit
Admission: RE | Admit: 2020-11-15 | Discharge: 2020-11-15 | Disposition: A | Discharge status: Home / Self Care | Payer: Medicare HMO | Source: Ambulatory Visit | Attending: Pain Medicine | Admitting: Pain Medicine

## 2020-11-15 ENCOUNTER — Encounter: Payer: Self-pay | Admitting: Pain Medicine

## 2020-11-15 ENCOUNTER — Ambulatory Visit (HOSPITAL_BASED_OUTPATIENT_CLINIC_OR_DEPARTMENT_OTHER): Payer: Medicare HMO | Admitting: Pain Medicine

## 2020-11-15 ENCOUNTER — Other Ambulatory Visit: Payer: Self-pay

## 2020-11-15 VITALS — BP 126/72 | HR 66 | Temp 96.8°F | Resp 16 | Ht 65.0 in

## 2020-11-15 DIAGNOSIS — M7061 Trochanteric bursitis, right hip: Secondary | ICD-10-CM | POA: Insufficient documentation

## 2020-11-15 DIAGNOSIS — M545 Low back pain, unspecified: Secondary | ICD-10-CM | POA: Insufficient documentation

## 2020-11-15 DIAGNOSIS — M79604 Pain in right leg: Secondary | ICD-10-CM | POA: Insufficient documentation

## 2020-11-15 DIAGNOSIS — M7071 Other bursitis of hip, right hip: Secondary | ICD-10-CM

## 2020-11-15 DIAGNOSIS — M792 Neuralgia and neuritis, unspecified: Secondary | ICD-10-CM

## 2020-11-15 DIAGNOSIS — M25552 Pain in left hip: Secondary | ICD-10-CM

## 2020-11-15 DIAGNOSIS — Z79891 Long term (current) use of opiate analgesic: Secondary | ICD-10-CM | POA: Insufficient documentation

## 2020-11-15 DIAGNOSIS — R1031 Right lower quadrant pain: Secondary | ICD-10-CM | POA: Insufficient documentation

## 2020-11-15 DIAGNOSIS — R1032 Left lower quadrant pain: Secondary | ICD-10-CM | POA: Insufficient documentation

## 2020-11-15 DIAGNOSIS — G8929 Other chronic pain: Secondary | ICD-10-CM | POA: Insufficient documentation

## 2020-11-15 DIAGNOSIS — M25551 Pain in right hip: Secondary | ICD-10-CM | POA: Insufficient documentation

## 2020-11-15 DIAGNOSIS — M7062 Trochanteric bursitis, left hip: Secondary | ICD-10-CM | POA: Insufficient documentation

## 2020-11-15 DIAGNOSIS — M5386 Other specified dorsopathies, lumbar region: Secondary | ICD-10-CM | POA: Insufficient documentation

## 2020-11-15 DIAGNOSIS — M7072 Other bursitis of hip, left hip: Secondary | ICD-10-CM | POA: Insufficient documentation

## 2020-11-15 DIAGNOSIS — M79605 Pain in left leg: Secondary | ICD-10-CM

## 2020-11-15 DIAGNOSIS — G894 Chronic pain syndrome: Secondary | ICD-10-CM | POA: Diagnosis not present

## 2020-11-15 DIAGNOSIS — Z7901 Long term (current) use of anticoagulants: Secondary | ICD-10-CM | POA: Insufficient documentation

## 2020-11-15 DIAGNOSIS — Z79899 Other long term (current) drug therapy: Secondary | ICD-10-CM

## 2020-11-15 DIAGNOSIS — M160 Bilateral primary osteoarthritis of hip: Secondary | ICD-10-CM | POA: Insufficient documentation

## 2020-11-15 MED ORDER — OXYCODONE HCL 5 MG PO TABS: 5.0000 mg | ORAL_TABLET | Freq: Three times a day (TID) | ORAL | 0 refills | Status: DC | PRN

## 2020-11-15 NOTE — Patient Instructions (Signed)
____________________________________________________________________________________________  Blood Thinners  IMPORTANT NOTICE:  If you take any of these, make sure to notify the nursing staff.  Failure to do so may result in injury.  Recommended time intervals to stop and restart blood-thinners, before & after invasive procedures  Generic Name Brand Name Pre-procedure. Stop this long before procedure. Post-procedure. Minimum waiting period before restarting.  Abciximab Reopro 15 days 2 hrs  Alteplase Activase 10 days 10 days  Anagrelide Agrylin    Apixaban Eliquis 3 days 6 hrs  Cilostazol Pletal 3 days 5 hrs  Clopidogrel Plavix 7-10 days 2 hrs  Dabigatran Pradaxa 5 days 6 hrs  Dalteparin Fragmin 24 hours 4 hrs  Dipyridamole Aggrenox 11days 2 hrs  Edoxaban Lixiana; Savaysa 3 days 2 hrs  Enoxaparin  Lovenox 24 hours 4 hrs  Eptifibatide Integrillin 8 hours 2 hrs  Fondaparinux  Arixtra 72 hours 12 hrs  Hydroxychloroquine Plaquenil 11 days   Prasugrel Effient 7-10 days 6 hrs  Reteplase Retavase 10 days 10 days  Rivaroxaban Xarelto 3 days 6 hrs  Ticagrelor Brilinta 5-7 days 6 hrs  Ticlopidine Ticlid 10-14 days 2 hrs  Tinzaparin Innohep 24 hours 4 hrs  Tirofiban Aggrastat 8 hours 2 hrs  Warfarin Coumadin 5 days 2 hrs   Other medications with blood-thinning effects  Product indications Generic (Brand) names Note  Cholesterol Lipitor Stop 4 days before procedure  Blood thinner (injectable) Heparin (LMW or LMWH Heparin) Stop 24 hours before procedure  Cancer Ibrutinib (Imbruvica) Stop 7 days before procedure  Malaria/Rheumatoid Hydroxychloroquine (Plaquenil) Stop 11 days before procedure  Thrombolytics  10 days before or after procedures   Over-the-counter (OTC) Products with blood-thinning effects  Product Common names Stop Time  Aspirin > 325 mg Goody Powders, Excedrin, etc. 11 days  Aspirin ? 81 mg  7 days  Fish oil  4 days  Garlic supplements  7 days  Ginkgo biloba  36  hours  Ginseng  24 hours  NSAIDs Ibuprofen, Naprosyn, etc. 3 days  Vitamin E  4 days   ____________________________________________________________________________________________ ____________________________________________________________________________________________  Preparing for your procedure (without sedation)  Procedure appointments are limited to planned procedures: No Prescription Refills. No disability issues will be discussed. No medication changes will be discussed.  Instructions: Oral Intake: Do not eat or drink anything for at least 6 hours prior to your procedure. (Exception: Blood Pressure Medication. See below.) Transportation: Unless otherwise stated by your physician, you may drive yourself after the procedure. Blood Pressure Medicine: Do not forget to take your blood pressure medicine with a sip of water the morning of the procedure. If your Diastolic (lower reading)is above 100 mmHg, elective cases will be cancelled/rescheduled. Blood thinners: These will need to be stopped for procedures. Notify our staff if you are taking any blood thinners. Depending on which one you take, there will be specific instructions on how and when to stop it. Diabetics on insulin: Notify the staff so that you can be scheduled 1st case in the morning. If your diabetes requires high dose insulin, take only  of your normal insulin dose the morning of the procedure and notify the staff that you have done so. Preventing infections: Shower with an antibacterial soap the morning of your procedure.  Build-up your immune system: Take 1000 mg of Vitamin C with every meal (3 times a day) the day prior to your procedure. Antibiotics: Inform the staff if you have a condition or reason that requires you to take antibiotics before dental procedures. Pregnancy: If you are pregnant,  call and cancel the procedure. Sickness: If you have a cold, fever, or any active infections, call and cancel the  procedure. Arrival: You must be in the facility at least 30 minutes prior to your scheduled procedure. Children: Do not bring any children with you. Dress appropriately: Bring dark clothing that you would not mind if they get stained. Valuables: Do not bring any jewelry or valuables.  Reasons to call and reschedule or cancel your procedure: (Following these recommendations will minimize the risk of a serious complication.) Surgeries: Avoid having procedures within 2 weeks of any surgery. (Avoid for 2 weeks before or after any surgery). Flu Shots: Avoid having procedures within 2 weeks of a flu shots or . (Avoid for 2 weeks before or after immunizations). Barium: Avoid having a procedure within 7-10 days after having had a radiological study involving the use of radiological contrast. (Myelograms, Barium swallow or enema study). Heart attacks: Avoid any elective procedures or surgeries for the initial 6 months after a "Myocardial Infarction" (Heart Attack). Blood thinners: It is imperative that you stop these medications before procedures. Let us know if you if you take any blood thinner.  Infection: Avoid procedures during or within two weeks of an infection (including chest colds or gastrointestinal problems). Symptoms associated with infections include: Localized redness, fever, chills, night sweats or profuse sweating, burning sensation when voiding, cough, congestion, stuffiness, runny nose, sore throat, diarrhea, nausea, vomiting, cold or Flu symptoms, recent or current infections. It is specially important if the infection is over the area that we intend to treat. Heart and lung problems: Symptoms that may suggest an active cardiopulmonary problem include: cough, chest pain, breathing difficulties or shortness of breath, dizziness, ankle swelling, uncontrolled high or unusually low blood pressure, and/or palpitations. If you are experiencing any of these symptoms, cancel your procedure and contact  your primary care physician for an evaluation.  Remember:  Regular Business hours are:  Monday to Thursday 8:00 AM to 4:00 PM  Provider's Schedule: Milinda Pointer, MD:  Procedure days: Tuesday and Thursday 7:30 AM to 4:00 PM  Gillis Santa, MD:  Procedure days: Monday and Wednesday 7:30 AM to 4:00 PM ____________________________________________________________________________________________  ____________________________________________________________________________________________  General Risks and Possible Complications  Patient Responsibilities: It is important that you read this as it is part of your informed consent. It is our duty to inform you of the risks and possible complications associated with treatments offered to you. It is your responsibility as a patient to read this and to ask questions about anything that is not clear or that you believe was not covered in this document.  Patient's Rights: You have the right to refuse treatment. You also have the right to change your mind, even after initially having agreed to have the treatment done. However, under this last option, if you wait until the last second to change your mind, you may be charged for the materials used up to that point.  Introduction: Medicine is not an Chief Strategy Officer. Everything in Medicine, including the lack of treatment(s), carries the potential for danger, harm, or loss (which is by definition: Risk). In Medicine, a complication is a secondary problem, condition, or disease that can aggravate an already existing one. All treatments carry the risk of possible complications. The fact that a side effects or complications occurs, does not imply that the treatment was conducted incorrectly. It must be clearly understood that these can happen even when everything is done following the highest safety standards.  No treatment: You  can choose not to proceed with the proposed treatment alternative. The "PRO(s)" would  include: avoiding the risk of complications associated with the therapy. The "CON(s)" would include: not getting any of the treatment benefits. These benefits fall under one of three categories: diagnostic; therapeutic; and/or palliative. Diagnostic benefits include: getting information which can ultimately lead to improvement of the disease or symptom(s). Therapeutic benefits are those associated with the successful treatment of the disease. Finally, palliative benefits are those related to the decrease of the primary symptoms, without necessarily curing the condition (example: decreasing the pain from a flare-up of a chronic condition, such as incurable terminal cancer).  General Risks and Complications: These are associated to most interventional treatments. They can occur alone, or in combination. They fall under one of the following six (6) categories: no benefit or worsening of symptoms; bleeding; infection; nerve damage; allergic reactions; and/or death. No benefits or worsening of symptoms: In Medicine there are no guarantees, only probabilities. No healthcare provider can ever guarantee that a medical treatment will work, they can only state the probability that it may. Furthermore, there is always the possibility that the condition may worsen, either directly, or indirectly, as a consequence of the treatment. Bleeding: This is more common if the patient is taking a blood thinner, either prescription or over the counter (example: Goody Powders, Fish oil, Aspirin, Garlic, etc.), or if suffering a condition associated with impaired coagulation (example: Hemophilia, cirrhosis of the liver, low platelet counts, etc.). However, even if you do not have one on these, it can still happen. If you have any of these conditions, or take one of these drugs, make sure to notify your treating physician. Infection: This is more common in patients with a compromised immune system, either due to disease (example:  diabetes, cancer, human immunodeficiency virus [HIV], etc.), or due to medications or treatments (example: therapies used to treat cancer and rheumatological diseases). However, even if you do not have one on these, it can still happen. If you have any of these conditions, or take one of these drugs, make sure to notify your treating physician. Nerve Damage: This is more common when the treatment is an invasive one, but it can also happen with the use of medications, such as those used in the treatment of cancer. The damage can occur to small secondary nerves, or to large primary ones, such as those in the spinal cord and brain. This damage may be temporary or permanent and it may lead to impairments that can range from temporary numbness to permanent paralysis and/or brain death. Allergic Reactions: Any time a substance or material comes in contact with our body, there is the possibility of an allergic reaction. These can range from a mild skin rash (contact dermatitis) to a severe systemic reaction (anaphylactic reaction), which can result in death. Death: In general, any medical intervention can result in death, most of the time due to an unforeseen complication. ____________________________________________________________________________________________

## 2020-11-15 NOTE — Progress Notes (Signed)
Nursing Pain Medication Assessment:  Safety precautions to be maintained throughout the outpatient stay will include: orient to surroundings, keep bed in low position, maintain call bell within reach at all times, provide assistance with transfer out of bed and ambulation.  Medication Inspection Compliance: Pill count conducted under aseptic conditions, in front of the patient. Neither the pills nor the bottle was removed from the patient's sight at any time. Once count was completed pills were immediately returned to the patient in their original bottle.  Medication: Oxycodone IR Pill/Patch Count:  18 of 65 pills remain Pill/Patch Appearance: Markings consistent with prescribed medication Bottle Appearance: Standard pharmacy container. Clearly labeled. Filled Date: 06 / 29 / 2022 Last Medication intake:  Today

## 2020-11-20 NOTE — Progress Notes (Signed)
The patient did not stop her Eliquis for the procedure.  Procedure rescheduled.

## 2020-11-21 ENCOUNTER — Encounter: Payer: Self-pay | Admitting: Pain Medicine

## 2020-11-21 ENCOUNTER — Ambulatory Visit: Payer: Medicare HMO | Attending: Pain Medicine | Admitting: Pain Medicine

## 2020-11-21 ENCOUNTER — Other Ambulatory Visit: Payer: Self-pay

## 2020-11-21 ENCOUNTER — Ambulatory Visit: Admission: RE | Admit: 2020-11-21 | Payer: Medicare HMO | Source: Ambulatory Visit

## 2020-11-21 VITALS — BP 134/68 | HR 71 | Temp 96.6°F | Resp 16 | Ht 65.0 in | Wt 259.0 lb

## 2020-11-21 DIAGNOSIS — M25552 Pain in left hip: Secondary | ICD-10-CM | POA: Insufficient documentation

## 2020-11-21 DIAGNOSIS — M160 Bilateral primary osteoarthritis of hip: Secondary | ICD-10-CM | POA: Diagnosis not present

## 2020-11-21 DIAGNOSIS — Z7901 Long term (current) use of anticoagulants: Secondary | ICD-10-CM | POA: Diagnosis not present

## 2020-11-21 DIAGNOSIS — G8929 Other chronic pain: Secondary | ICD-10-CM | POA: Diagnosis not present

## 2020-11-21 DIAGNOSIS — M7062 Trochanteric bursitis, left hip: Secondary | ICD-10-CM | POA: Diagnosis not present

## 2020-11-21 DIAGNOSIS — M25551 Pain in right hip: Secondary | ICD-10-CM | POA: Diagnosis not present

## 2020-11-21 DIAGNOSIS — M7061 Trochanteric bursitis, right hip: Secondary | ICD-10-CM | POA: Insufficient documentation

## 2020-11-21 MED ORDER — ROPIVACAINE HCL 2 MG/ML IJ SOLN: 18.0000 mL | Freq: Once | INTRAMUSCULAR | Status: DC

## 2020-11-21 MED ORDER — METHYLPREDNISOLONE ACETATE 80 MG/ML IJ SUSP: 160.0000 mg | Freq: Once | INTRAMUSCULAR | Status: DC

## 2020-11-21 MED ORDER — LIDOCAINE HCL 2 % IJ SOLN: 20.0000 mL | Freq: Once | INTRAMUSCULAR | Status: DC

## 2020-11-21 MED ORDER — PENTAFLUOROPROP-TETRAFLUOROETH EX AERO
INHALATION_SPRAY | Freq: Once | CUTANEOUS | Status: DC
  Filled 2020-11-21: qty 116

## 2020-11-21 MED ORDER — IOHEXOL 180 MG/ML  SOLN: 10.0000 mL | Freq: Once | INTRAMUSCULAR | Status: DC

## 2020-11-21 NOTE — Progress Notes (Signed)
Safety precautions to be maintained throughout the outpatient stay will include: orient to surroundings, keep bed in low position, maintain call bell within reach at all times, provide assistance with transfer out of bed and ambulation.  

## 2020-11-21 NOTE — Patient Instructions (Signed)
____________________________________________________________________________________________  Blood Thinners  IMPORTANT NOTICE:  If you take any of these, make sure to notify the nursing staff.  Failure to do so may result in injury.  Recommended time intervals to stop and restart blood-thinners, before & after invasive procedures  Generic Name Brand Name Pre-procedure. Stop this long before procedure. Post-procedure. Minimum waiting period before restarting.  Abciximab Reopro 15 days 2 hrs  Alteplase Activase 10 days 10 days  Anagrelide Agrylin    Apixaban Eliquis 3 days 6 hrs  Cilostazol Pletal 3 days 5 hrs  Clopidogrel Plavix 7-10 days 2 hrs  Dabigatran Pradaxa 5 days 6 hrs  Dalteparin Fragmin 24 hours 4 hrs  Dipyridamole Aggrenox 11days 2 hrs  Edoxaban Lixiana; Savaysa 3 days 2 hrs  Enoxaparin  Lovenox 24 hours 4 hrs  Eptifibatide Integrillin 8 hours 2 hrs  Fondaparinux  Arixtra 72 hours 12 hrs  Hydroxychloroquine Plaquenil 11 days   Prasugrel Effient 7-10 days 6 hrs  Reteplase Retavase 10 days 10 days  Rivaroxaban Xarelto 3 days 6 hrs  Ticagrelor Brilinta 5-7 days 6 hrs  Ticlopidine Ticlid 10-14 days 2 hrs  Tinzaparin Innohep 24 hours 4 hrs  Tirofiban Aggrastat 8 hours 2 hrs  Warfarin Coumadin 5 days 2 hrs   Other medications with blood-thinning effects  Product indications Generic (Brand) names Note  Cholesterol Lipitor Stop 4 days before procedure  Blood thinner (injectable) Heparin (LMW or LMWH Heparin) Stop 24 hours before procedure  Cancer Ibrutinib (Imbruvica) Stop 7 days before procedure  Malaria/Rheumatoid Hydroxychloroquine (Plaquenil) Stop 11 days before procedure  Thrombolytics  10 days before or after procedures   Over-the-counter (OTC) Products with blood-thinning effects  Product Common names Stop Time  Aspirin > 325 mg Goody Powders, Excedrin, etc. 11 days  Aspirin ? 81 mg  7 days  Fish oil  4 days  Garlic supplements  7 days  Ginkgo biloba  36  hours  Ginseng  24 hours  NSAIDs Ibuprofen, Naprosyn, etc. 3 days  Vitamin E  4 days   ____________________________________________________________________________________________ ______________________________________________________________________  Preparing for your procedure (without sedation)  Procedure appointments are limited to planned procedures: No Prescription Refills. No disability issues will be discussed. No medication changes will be discussed.  Instructions: Oral Intake: Do not eat or drink anything for at least 6 hours prior to your procedure. (Exception: Blood Pressure Medication. See below.) Transportation: Unless otherwise stated by your physician, you may drive yourself after the procedure. Blood Pressure Medicine: Do not forget to take your blood pressure medicine with a sip of water the morning of the procedure. If your Diastolic (lower reading)is above 100 mmHg, elective cases will be cancelled/rescheduled. Blood thinners: These will need to be stopped for procedures. Notify our staff if you are taking any blood thinners. Depending on which one you take, there will be specific instructions on how and when to stop it. Diabetics on insulin: Notify the staff so that you can be scheduled 1st case in the morning. If your diabetes requires high dose insulin, take only  of your normal insulin dose the morning of the procedure and notify the staff that you have done so. Preventing infections: Shower with an antibacterial soap the morning of your procedure.  Build-up your immune system: Take 1000 mg of Vitamin C with every meal (3 times a day) the day prior to your procedure. Antibiotics: Inform the staff if you have a condition or reason that requires you to take antibiotics before dental procedures. Pregnancy: If you are pregnant,  call and cancel the procedure. Sickness: If you have a cold, fever, or any active infections, call and cancel the procedure. Arrival: You must  be in the facility at least 30 minutes prior to your scheduled procedure. Children: Do not bring any children with you. Dress appropriately: Bring dark clothing that you would not mind if they get stained. Valuables: Do not bring any jewelry or valuables.  Reasons to call and reschedule or cancel your procedure: (Following these recommendations will minimize the risk of a serious complication.) Surgeries: Avoid having procedures within 2 weeks of any surgery. (Avoid for 2 weeks before or after any surgery). Flu Shots: Avoid having procedures within 2 weeks of a flu shots or . (Avoid for 2 weeks before or after immunizations). Barium: Avoid having a procedure within 7-10 days after having had a radiological study involving the use of radiological contrast. (Myelograms, Barium swallow or enema study). Heart attacks: Avoid any elective procedures or surgeries for the initial 6 months after a "Myocardial Infarction" (Heart Attack). Blood thinners: It is imperative that you stop these medications before procedures. Let us know if you if you take any blood thinner.  Infection: Avoid procedures during or within two weeks of an infection (including chest colds or gastrointestinal problems). Symptoms associated with infections include: Localized redness, fever, chills, night sweats or profuse sweating, burning sensation when voiding, cough, congestion, stuffiness, runny nose, sore throat, diarrhea, nausea, vomiting, cold or Flu symptoms, recent or current infections. It is specially important if the infection is over the area that we intend to treat. Heart and lung problems: Symptoms that may suggest an active cardiopulmonary problem include: cough, chest pain, breathing difficulties or shortness of breath, dizziness, ankle swelling, uncontrolled high or unusually low blood pressure, and/or palpitations. If you are experiencing any of these symptoms, cancel your procedure and contact your primary care physician  for an evaluation.  Remember:  Regular Business hours are:  Monday to Thursday 8:00 AM to 4:00 PM  Provider's Schedule: Milinda Pointer, MD:  Procedure days: Tuesday and Thursday 7:30 AM to 4:00 PM  Gillis Santa, MD:  Procedure days: Monday and Wednesday 7:30 AM to 4:00 PM ______________________________________________________________________

## 2020-11-22 ENCOUNTER — Telehealth: Payer: Self-pay | Admitting: *Deleted

## 2020-11-22 NOTE — Telephone Encounter (Signed)
Called for post procedure check. No answer. LVM. 

## 2020-11-27 NOTE — Progress Notes (Addendum)
PROVIDER NOTE: Information contained herein reflects review and annotations entered in association with encounter. Interpretation of such information and data should be left to medically-trained personnel. Information provided to patient can be located elsewhere in the medical record under "Patient Instructions". Document created using STT-dictation technology, any transcriptional errors that may result from process are unintentional.    Patient: Jamie Phillips  Service Category: Procedure  Provider: Gaspar Cola, MD  DOB: Oct 20, 1940  DOS: 11/30/2020  Location: [facility]  MRN: [id number]  Setting: Ambulatory - outpatient  Referring Provider: Kirk Ruths, MD  Type: Established Patient  Specialty: Interventional Pain Management  PCP: Kirk Ruths, MD   Primary Reason for Visit: Interventional Pain Management Treatment. CC: Back Pain and Hip Pain (bilateral)  Procedure #1:  Anesthesia, Analgesia, Anxiolysis:  Type: Intra-Articular Hip Injection          Primary Purpose: Diagnostic Region: Posterolateral hip joint area. Level: Lower pelvic and hip joint level. Target Area: Superior aspect of the hip joint cavity, going thru the superior portion of the capsular ligament. Approach: Posterolateral approach. Laterality: Bilateral  Type: Local Anesthesia Indication(s): Analgesia         Route: Infiltration ([place]/IM) IV Access: Declined Sedation: Declined  Local Anesthetic: Lidocaine 1-2%  Position: Prone Area Prepped: Entire Posterolateral hip area. DuraPrep (Iodine Povacrylex [0.7% available iodine] and Isopropyl Alcohol, 74% w/w)   Procedure #2:    Type: Gluteofemoral Bursa Injection          Primary Purpose: Diagnostic Region: Upper (proximal) Femoral Region Level: Hip Joint Target Area: Superior aspect of the hip joint cavity, going thru the superior portion of the capsular ligament. Approach: Posterolateral approach Laterality: Bilateral      Indications: 1. Chronic hip pain (Bilateral)   2. Iliopsoas bursitis of hips   3. Osteoarthritis of hips (Bilateral)   4. Trochanteric bursitis of hips (Bilateral)   5. Chronic anticoagulation (Eliquis)    Pain Score: Pre-procedure: 8 /10 Post-procedure: 0-No pain/10   Pre-op H&P Assessment:  Jamie Phillips is a 80 y.o. (year old), female patient, seen today for interventional treatment. She  has a past surgical history that includes neck fusion; Wrist surgery (Left, 2000); Cardiac catheterization (N/A, 08/15/2015); IVC FILTER INSERTION (N/A, 06/11/2016); Colonoscopy; Joint replacement (Left, 06/17/2016); Colonoscopy with propofol (N/A, 03/10/2017); IVC FILTER REMOVAL (N/A, 05/06/2017); laparoscopy (N/A, 10/10/2018); laparoscopic appendectomy (N/A, 10/10/2018); Partial colectomy (Right, 10/10/2018); Appendectomy; Esophagogastroduodenoscopy (egd) with propofol (N/A, 02/02/2019); and Breast biopsy (Left, 08/19/04). Jamie Phillips has a current medication list which includes the following prescription(s): acetaminophen, albuterol, cholestyramine, duloxetine, eliquis, escitalopram, gabapentin, losartan-hydrochlorothiazide, multiple vitamins-minerals, oxycodone, [START ON 12/17/2020] oxycodone, [START ON 01/16/2021] oxycodone, pantoprazole, pravastatin, propranolol, and sucralfate. Her primarily concern today is the Back Pain and Hip Pain (bilateral)  Initial Vital Signs:  Pulse/HCG Rate: 65ECG Heart Rate: 61 Temp: (!) 96.9 F (36.1 C) Resp: 16 BP: 110/85 SpO2: 94 %  BMI: Estimated body mass index is 43.1 kg/m as calculated from the following:   Height as of this encounter: '5\' 5"'$  (1.651 m).   Weight as of this encounter: 259 lb (117.5 kg).  Risk Assessment: Allergies: Reviewed. She has No Known Allergies.  Allergy Precautions: None required Coagulopathies: Reviewed. None identified.  Blood-thinner therapy: None at this time Active Infection(s): Reviewed. None identified. Jamie Phillips is  afebrile  Site Confirmation: Jamie Phillips was asked to confirm the procedure and laterality before marking the site Procedure checklist: Completed Consent: Before the procedure and under the influence of no sedative(s),  amnesic(s), or anxiolytics, the patient was informed of the treatment options, risks and possible complications. To fulfill our ethical and legal obligations, as recommended by the American Medical Association's Code of Ethics, I have informed the patient of my clinical impression; the nature and purpose of the treatment or procedure; the risks, benefits, and possible complications of the intervention; the alternatives, including doing nothing; the risk(s) and benefit(s) of the alternative treatment(s) or procedure(s); and the risk(s) and benefit(s) of doing nothing. The patient was provided information about the general risks and possible complications associated with the procedure. These may include, but are not limited to: failure to achieve desired goals, infection, bleeding, organ or nerve damage, allergic reactions, paralysis, and death. In addition, the patient was informed of those risks and complications associated to the procedure, such as failure to decrease pain; infection; bleeding; organ or nerve damage with subsequent damage to sensory, motor, and/or autonomic systems, resulting in permanent pain, numbness, and/or weakness of one or several areas of the body; allergic reactions; (i.e.: anaphylactic reaction); and/or death. Furthermore, the patient was informed of those risks and complications associated with the medications. These include, but are not limited to: allergic reactions (i.e.: anaphylactic or anaphylactoid reaction(s)); adrenal axis suppression; blood sugar elevation that in diabetics may result in ketoacidosis or comma; water retention that in patients with history of congestive heart failure may result in shortness of breath, pulmonary edema, and decompensation  with resultant heart failure; weight gain; swelling or edema; medication-induced neural toxicity; particulate matter embolism and blood vessel occlusion with resultant organ, and/or nervous system infarction; and/or aseptic necrosis of one or more joints. Finally, the patient was informed that Medicine is not an exact science; therefore, there is also the possibility of unforeseen or unpredictable risks and/or possible complications that may result in a catastrophic outcome. The patient indicated having understood very clearly. We have given the patient no guarantees and we have made no promises. Enough time was given to the patient to ask questions, all of which were answered to the patient's satisfaction. Ms. Gabhart has indicated that she wanted to continue with the procedure. Attestation: I, the ordering provider, attest that I have discussed with the patient the benefits, risks, side-effects, alternatives, likelihood of achieving goals, and potential problems during recovery for the procedure that I have provided informed consent. Date  Time: 11/30/2020 11:12 AM  Pre-Procedure Preparation:  Monitoring: As per clinic protocol. Respiration, ETCO2, SpO2, BP, heart rate and rhythm monitor placed and checked for adequate function Safety Precautions: Patient was assessed for positional comfort and pressure points before starting the procedure. Time-out: I initiated and conducted the "Time-out" before starting the procedure, as per protocol. The patient was asked to participate by confirming the accuracy of the "Time Out" information. Verification of the correct person, site, and procedure were performed and confirmed by me, the nursing staff, and the patient. "Time-out" conducted as per Joint Commission's Universal Protocol (UP.01.01.01). Time: 1207  Description of Procedure #1:  Safety Precautions: Aspiration looking for blood return was conducted prior to all injections. At no point did we inject any  substances, as a needle was being advanced. No attempts were made at seeking any paresthesias. Safe injection practices and needle disposal techniques used. Medications properly checked for expiration dates. SDV (single dose vial) medications used. Description of the Procedure: Protocol guidelines were followed. The patient was placed in position over the fluoroscopy table. The target area was identified and the area prepped in the usual manner. Skin & deeper  tissues infiltrated with local anesthetic. Appropriate amount of time allowed to pass for local anesthetics to take effect. The procedure needles were then advanced to the target area. Proper needle placement secured. Negative aspiration confirmed. Solution injected in intermittent fashion, asking for systemic symptoms every 0.5cc of injectate. The needles were then removed and the area cleansed, making sure to leave some of the prepping solution back to take advantage of its long term bactericidal properties.  Start Time: 1207 hrs. Materials:  Needle(s) Type: Spinal Needle Gauge: 22G Length: 5.0-in Medication(s): Please see orders for medications and dosing details.  Imaging Guidance (Non-Spinal):          Type of Imaging Technique: Fluoroscopy Guidance (Non-Spinal) Indication(s): Assistance in needle guidance and placement for procedures requiring needle placement in or near specific anatomical locations not easily accessible without such assistance. Exposure Time: Please see nurses notes. Contrast: Before injecting any contrast, we confirmed that the patient did not have an allergy to iodine, shellfish, or radiological contrast. Once satisfactory needle placement was completed at the desired level, radiological contrast was injected. Contrast injected under live fluoroscopy. No contrast complications. See chart for type and volume of contrast used. Fluoroscopic Guidance: I was personally present during the use of fluoroscopy. "Tunnel Vision  Technique" used to obtain the best possible view of the target area. Parallax error corrected before commencing the procedure. "Direction-depth-direction" technique used to introduce the needle under continuous pulsed fluoroscopy. Once target was reached, antero-posterior, oblique, and lateral fluoroscopic projection used confirm needle placement in all planes. Images permanently stored in EMR. Interpretation: I personally interpreted the imaging intraoperatively. Adequate needle placement confirmed in multiple planes. Appropriate spread of contrast into desired area was observed. No evidence of afferent or efferent intravascular uptake. Permanent images saved into the patient's record.   Description of Procedure #2:  Description of the Procedure: Skin & deeper tissues infiltrated with local anesthetic. Appropriate amount of time allowed to pass for local anesthetics to take effect. The procedure needles were then advanced to the target area. Proper needle placement secured. Negative aspiration confirmed. Solution injected in intermittent fashion, asking for systemic symptoms every 0.5cc of injectate. The needles were then removed and the area cleansed, making sure to leave some of the prepping solution back to take advantage of its long term bactericidal properties.  Vitals:   11/30/20 1205 11/30/20 1210 11/30/20 1215 11/30/20 1220  BP: 121/78 112/72 124/73 125/73  Pulse:      Resp: '18 16 18 18  '$ Temp:      TempSrc:      SpO2: 95% 95% 96% 95%  Weight:      Height:         End Time: 1220 hrs.         Materials:  Needle(s) Type: Spinal Needle Gauge: 22G Length: 5.0-in Medication(s): Please see orders for medications and dosing details.  Imaging Guidance (Non-Spinal):          Type of Imaging Technique: Fluoroscopy Guidance (Non-Spinal) Indication(s): Assistance in needle guidance and placement for procedures requiring needle placement in or near specific anatomical locations not easily  accessible without such assistance. Exposure Time: Please see nurses notes. Contrast: Before injecting any contrast, we confirmed that the patient did not have an allergy to iodine, shellfish, or radiological contrast. Once satisfactory needle placement was completed at the desired level, radiological contrast was injected. Contrast injected under live fluoroscopy. No contrast complications. See chart for type and volume of contrast used. Fluoroscopic Guidance: I was personally present  during the use of fluoroscopy. "Tunnel Vision Technique" used to obtain the best possible view of the target area. Parallax error corrected before commencing the procedure. "Direction-depth-direction" technique used to introduce the needle under continuous pulsed fluoroscopy. Once target was reached, antero-posterior, oblique, and lateral fluoroscopic projection used confirm needle placement in all planes. Images permanently stored in EMR. Interpretation: I personally interpreted the imaging intraoperatively. Adequate needle placement confirmed in multiple planes. Appropriate spread of contrast into desired area was observed. No evidence of afferent or efferent intravascular uptake. Permanent images saved into the patient's record.  Antibiotic Prophylaxis:   Anti-infectives (From admission, onward)    None      Indication(s): None identified  Post-operative Assessment:  Post-procedure Vital Signs:  Pulse/HCG Rate: 6565 Temp: (!) 96.9 F (36.1 C) Resp: 18 BP: 125/73 SpO2: 95 %  EBL: None  Complications: No immediate post-treatment complications observed by team, or reported by patient.  Note: The patient tolerated the entire procedure well. A repeat set of vitals were taken after the procedure and the patient was kept under observation following institutional policy, for this type of procedure. Post-procedural neurological assessment was performed, showing return to baseline, prior to discharge. The patient  was provided with post-procedure discharge instructions, including a section on how to identify potential problems. Should any problems arise concerning this procedure, the patient was given instructions to immediately contact us, at any time, without hesitation. In any case, we plan to contact the patient by telephone for a follow-up status report regarding this interventional procedure.  Comments:  No additional relevant information.  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    Scheduling Instructions:     Procedure: Trochanteric bursa injection     Laterality: Bilateral     Sedation: No Sedation.     Timeframe: Today   HIP INJECTION    Scheduling Instructions:     Side: Bilateral     Sedation: No Sedation.     Timeframe: Today   DG PAIN [facility] C-ARM 1-60 MIN NO REPORT    Intraoperative interpretation by procedural physician at [place].    Standing Status:   Standing    Number of Occurrences:   1    Order Specific Question:   Reason for exam:    Answer:   Assistance in needle guidance and placement for procedures requiring needle placement in or near specific anatomical locations not easily accessible without such assistance.   Informed Consent Details: Physician/Practitioner Attestation; Transcribe to consent form and obtain patient signature    Note: Always confirm laterality of pain with Ms. Hardin Negus, before procedure. Transcribe to consent form and obtain patient signature.    Order Specific Question:   Physician/Practitioner attestation of informed consent for procedure/surgical case    Answer:   I, the physician/practitioner, attest that I have discussed with the patient the benefits, risks, side effects, alternatives, likelihood of achieving goals and potential problems during recovery for the procedure that I  have provided informed consent.    Order Specific Question:   Procedure    Answer:   Hip bursa injection    Order Specific Question:   Physician/Practitioner performing the procedure    Answer:   Krishav Mamone A. Dossie Arbour, MD    Order Specific Question:   Indication/Reason    Answer:   Hip bursitis   Informed Consent Details: Physician/Practitioner Attestation;  Transcribe to consent form and obtain patient signature    Nursing Order: Transcribe to consent form and obtain patient signature. Note: Always confirm laterality of pain with Ms. Hardin Negus, before procedure.    Order Specific Question:   Physician/Practitioner attestation of informed consent for procedure/surgical case    Answer:   I, the physician/practitioner, attest that I have discussed with the patient the benefits, risks, side effects, alternatives, likelihood of achieving goals and potential problems during recovery for the procedure that I have provided informed consent.    Order Specific Question:   Procedure    Answer:   Hip injection    Order Specific Question:   Physician/Practitioner performing the procedure    Answer:   Joeangel Jeanpaul A. Dossie Arbour, MD    Order Specific Question:   Indication/Reason    Answer:   Hip Joint Pain (Arthralgia)    Chronic Opioid Analgesic:  Tramadol 50 mg, 1 tab p.o. 4 times daily (200 mg/day of tramadol) MME/day: 20 mg/day   Medications ordered for procedure: Meds ordered this encounter  Medications   iohexol (OMNIPAQUE) 180 MG/ML injection 10 mL    Must be Myelogram-compatible. If not available, you may substitute with a water-soluble, non-ionic, hypoallergenic, myelogram-compatible radiological contrast medium.   lidocaine (XYLOCAINE) 2 % (with pres) injection 400 mg   ropivacaine (PF) 2 mg/mL (0.2%) (NAROPIN) injection 18 mL   methylPREDNISolone acetate (DEPO-MEDROL) injection 160 mg    Medications administered: We administered iohexol, lidocaine, ropivacaine (PF) 2 mg/mL (0.2%), and  methylPREDNISolone acetate.  See the medical record for exact dosing, route, and time of administration.  Follow-up plan:   Return in about 2 weeks (around 12/14/2020) for (T,Th) (91F) (PPE).      Interventional Therapies  Risk  Complexity Considerations:   Estimated body mass index is 43.6 kg/m as calculated from the following:   Height as of this encounter: '5\' 5"'$  (1.651 m).   Weight as of this encounter: 262 lb (118.8 kg). NOTE: Eliquis Anticoagulation: (Stop: 3 days  Restart: 6 hrs)   Planned  Pending:   Diagnostic bilateral IA hip joint injection #1  Diagnostic bilateral trochanteric bursa injection #1    Under consideration:   Diagnostic bilateral IA hip joint injection #1  Diagnostic bilateral trochanteric bursa injection #1  Diagnostic bilateral iliopsoas bursa injection #1  Diagnostic midline T12-L1 LESI    Completed:   Diagnostic bilateral lumbar facet block x1 (08/15/2020) (100/100/50/50)  Diagnostic bilateral L3 TFESI x2 (09/05/2020) (100/100/100/100% relief of the bilateral L1/L2 radiculitis)  Diagnostic right L2-3 LESI x1 (07/11/2020) (100/100/100/100% relief of the bilateral L1/L2 radiculitis)    Therapeutic  Palliative (PRN) options:   Therapeutic right L2-3 LESI #2  Therapeutic bilateral L3 transforaminal ESI #3  Diagnostic bilateral lumbar facet MBB #2        Recent Visits Date Type Provider Dept  11/30/20 Procedure visit Milinda Pointer, MD [facility]  11/21/20 Procedure visit Milinda Pointer, MD [facility]  11/15/20 Office Visit Milinda Pointer, MD [facility]  11/07/20 Procedure visit Milinda Pointer, MD [facility]  10/18/20 Telemedicine Milinda Pointer, MD [facility]  10/05/20 Procedure visit Milinda Pointer, MD [facility]  09/21/20 Telemedicine Milinda Pointer, MD [facility]  Showing recent visits within past 90 days and meeting all other  requirements Today's Visits Date Type Provider Dept  12/12/20 Office Visit Milinda Pointer, MD [facility]  Showing today's visits and meeting all other requirements Future Appointments Date Type Provider Dept  02/07/21  Appointment Milinda Pointer, MD [facility]  Showing future appointments within next 90 days and meeting all other requirements Disposition: Discharge home  Discharge (Date  Time): 11/30/2020; 1235 hrs.   Primary Care Physician: Kirk Ruths, MD Location: [facility] Note by: Gaspar Cola, MD Date: 11/30/2020; Time: 11:22 AM  Disclaimer:  Medicine is not an Chief Strategy Officer. The only guarantee in medicine is that nothing is guaranteed. It is important to note that the decision to proceed with this intervention was based on the information collected from the patient. The Data and conclusions were drawn from the patient's questionnaire, the interview, and the physical examination. Because the information was provided in large part by the patient, it cannot be guaranteed that it has not been purposely or unconsciously manipulated. Every effort has been made to obtain as much relevant data as possible for this evaluation. It is important to note that the conclusions that lead to this procedure are derived in large part from the available data. Always take into account that the treatment will also be dependent on availability of resources and existing treatment guidelines, considered by other Pain Management Practitioners as being common knowledge and practice, at the time of the intervention. For Medico-Legal purposes, it is also important to point out that variation in procedural techniques and pharmacological choices are the acceptable norm. The indications, contraindications, technique, and results of the above procedure should only be interpreted and judged by a Board-Certified Interventional Pain Specialist with  extensive familiarity and expertise in the same exact procedure and technique.

## 2020-11-30 ENCOUNTER — Ambulatory Visit: Payer: Medicare HMO | Admitting: Pain Medicine

## 2020-11-30 ENCOUNTER — Ambulatory Visit
Admission: RE | Admit: 2020-11-30 | Discharge: 2020-11-30 | Disposition: A | Discharge status: Home / Self Care | Payer: Medicare HMO | Source: Ambulatory Visit | Attending: Pain Medicine | Admitting: Pain Medicine

## 2020-11-30 ENCOUNTER — Encounter: Payer: Self-pay | Admitting: Pain Medicine

## 2020-11-30 ENCOUNTER — Other Ambulatory Visit: Payer: Self-pay

## 2020-11-30 ENCOUNTER — Ambulatory Visit (HOSPITAL_BASED_OUTPATIENT_CLINIC_OR_DEPARTMENT_OTHER): Payer: Medicare HMO | Admitting: Pain Medicine

## 2020-11-30 VITALS — BP 125/73 | HR 65 | Temp 96.9°F | Resp 18 | Ht 65.0 in | Wt 259.0 lb

## 2020-11-30 DIAGNOSIS — G8929 Other chronic pain: Secondary | ICD-10-CM | POA: Diagnosis not present

## 2020-11-30 DIAGNOSIS — M7071 Other bursitis of hip, right hip: Secondary | ICD-10-CM | POA: Diagnosis not present

## 2020-11-30 DIAGNOSIS — M7061 Trochanteric bursitis, right hip: Secondary | ICD-10-CM | POA: Diagnosis not present

## 2020-11-30 DIAGNOSIS — M25552 Pain in left hip: Secondary | ICD-10-CM | POA: Diagnosis not present

## 2020-11-30 DIAGNOSIS — M25551 Pain in right hip: Secondary | ICD-10-CM | POA: Diagnosis not present

## 2020-11-30 DIAGNOSIS — Z7901 Long term (current) use of anticoagulants: Secondary | ICD-10-CM | POA: Diagnosis not present

## 2020-11-30 DIAGNOSIS — M160 Bilateral primary osteoarthritis of hip: Secondary | ICD-10-CM | POA: Diagnosis not present

## 2020-11-30 DIAGNOSIS — M7072 Other bursitis of hip, left hip: Secondary | ICD-10-CM | POA: Insufficient documentation

## 2020-11-30 DIAGNOSIS — M7062 Trochanteric bursitis, left hip: Secondary | ICD-10-CM | POA: Insufficient documentation

## 2020-11-30 MED ORDER — METHYLPREDNISOLONE ACETATE 80 MG/ML IJ SUSP
INTRAMUSCULAR | Status: AC
  Filled 2020-11-30: qty 2

## 2020-11-30 MED ORDER — METHYLPREDNISOLONE ACETATE 80 MG/ML IJ SUSP
160.0000 mg | Freq: Once | INTRAMUSCULAR | Status: AC
  Administered 2020-11-30: 160 mg via INTRA_ARTICULAR

## 2020-11-30 MED ORDER — LIDOCAINE HCL (PF) 2 % IJ SOLN
INTRAMUSCULAR | Status: AC
  Filled 2020-11-30: qty 10

## 2020-11-30 MED ORDER — LIDOCAINE HCL 2 % IJ SOLN
20.0000 mL | Freq: Once | INTRAMUSCULAR | Status: AC
  Administered 2020-11-30: 100 mg

## 2020-11-30 MED ORDER — ROPIVACAINE HCL 2 MG/ML IJ SOLN
INTRAMUSCULAR | Status: AC
  Filled 2020-11-30: qty 20

## 2020-11-30 MED ORDER — ROPIVACAINE HCL 2 MG/ML IJ SOLN
18.0000 mL | Freq: Once | INTRAMUSCULAR | Status: AC
  Administered 2020-11-30: 18 mL via INTRA_ARTICULAR

## 2020-11-30 MED ORDER — IOHEXOL 180 MG/ML  SOLN
10.0000 mL | Freq: Once | INTRAMUSCULAR | Status: AC
  Administered 2020-11-30: 10 mL via INTRA_ARTICULAR

## 2020-11-30 NOTE — Patient Instructions (Addendum)
____________________________________________________________________________________________  Post-Procedure Discharge Instructions  Instructions: Apply ice:  Purpose: This will minimize any swelling and discomfort after procedure.  When: Day of procedure, as soon as you get home. How: Fill a plastic sandwich bag with crushed ice. Cover it with a small towel and apply to injection site. How long: (15 min on, 15 min off) Apply for 15 minutes then remove x 15 minutes.  Repeat sequence on day of procedure, until you go to bed. Apply heat:  Purpose: To treat any soreness and discomfort from the procedure. When: Starting the next day after the procedure. How: Apply heat to procedure site starting the day following the procedure. How long: May continue to repeat daily, until discomfort goes away. Food intake: Start with clear liquids (like water) and advance to regular food, as tolerated.  Physical activities: Keep activities to a minimum for the first 8 hours after the procedure. After that, then as tolerated. Driving: If you have received any sedation, be responsible and do not drive. You are not allowed to drive for 24 hours after having sedation. Blood thinner: (Applies only to those taking blood thinners) You may restart your blood thinner 6 hours after your procedure. Insulin: (Applies only to Diabetic patients taking insulin) As soon as you can eat, you may resume your normal dosing schedule. Infection prevention: Keep procedure site clean and dry. Shower daily and clean area with soap and water. Post-procedure Pain Diary: Extremely important that this be done correctly and accurately. Recorded information will be used to determine the next step in treatment. For the purpose of accuracy, follow these rules: Evaluate only the area treated. Do not report or include pain from an untreated area. For the purpose of this evaluation, ignore all other areas of pain, except for the treated  area. After your procedure, avoid taking a long nap and attempting to complete the pain diary after you wake up. Instead, set your alarm clock to go off every hour, on the hour, for the initial 8 hours after the procedure. Document the duration of the numbing medicine, and the relief you are getting from it. Do not go to sleep and attempt to complete it later. It will not be accurate. If you received sedation, it is likely that you were given a medication that may cause amnesia. Because of this, completing the diary at a later time may cause the information to be inaccurate. This information is needed to plan your care. Follow-up appointment: Keep your post-procedure follow-up evaluation appointment after the procedure (usually 2 weeks for most procedures, 6 weeks for radiofrequencies). DO NOT FORGET to bring you pain diary with you.   Expect: (What should I expect to see with my procedure?) From numbing medicine (AKA: Local Anesthetics): Numbness or decrease in pain. You may also experience some weakness, which if present, could last for the duration of the local anesthetic. Onset: Full effect within 15 minutes of injected. Duration: It will depend on the type of local anesthetic used. On the average, 1 to 8 hours.  From steroids (Applies only if steroids were used): Decrease in swelling or inflammation. Once inflammation is improved, relief of the pain will follow. Onset of benefits: Depends on the amount of swelling present. The more swelling, the longer it will take for the benefits to be seen. In some cases, up to 10 days. Duration: Steroids will stay in the system x 2 weeks. Duration of benefits will depend on multiple posibilities including persistent irritating factors. Side-effects: If present, they   may typically last 2 weeks (the duration of the steroids). Frequent: Cramps (if they occur, drink Gatorade and take over-the-counter Magnesium 450-500 mg once to twice a day); water retention with  temporary weight gain; increases in blood sugar; decreased immune system response; increased appetite. Occasional: Facial flushing (red, warm cheeks); mood swings; menstrual changes. Uncommon: Long-term decrease or suppression of natural hormones; bone thinning. (These are more common with higher doses or more frequent use. This is why we prefer that our patients avoid having any injection therapies in other practices.)  Very Rare: Severe mood changes; psychosis; aseptic necrosis. From procedure: Some discomfort is to be expected once the numbing medicine wears off. This should be minimal if ice and heat are applied as instructed.  Call if: (When should I call?) You experience numbness and weakness that gets worse with time, as opposed to wearing off. New onset bowel or bladder incontinence. (Applies only to procedures done in the spine)  Emergency Numbers: Durning business hours (Monday - Thursday, 8:00 AM - 4:00 PM) (Friday, 9:00 AM - 12:00 Noon): [phone] After hours: [phone] NOTE: If you are having a problem and are unable connect with, or to talk to a provider, then go to your nearest urgent care or emergency department. If the problem is serious and urgent, please call 911. ____________________________________________________________________________________________   ____________________________________________________________________________________________  Post-Procedure Discharge Instructions  Instructions: Apply ice:  Purpose: This will minimize any swelling and discomfort after procedure.  When: Day of procedure, as soon as you get home. How: Fill a plastic sandwich bag with crushed ice. Cover it with a small towel and apply to injection site. How long: (15 min on, 15 min off) Apply for 15 minutes then remove x 15 minutes.  Repeat sequence on day of procedure, until you go to bed. Apply heat:  Purpose: To treat any soreness and discomfort from the  procedure. When: Starting the next day after the procedure. How: Apply heat to procedure site starting the day following the procedure. How long: May continue to repeat daily, until discomfort goes away. Food intake: Start with clear liquids (like water) and advance to regular food, as tolerated.  Physical activities: Keep activities to a minimum for the first 8 hours after the procedure. After that, then as tolerated. Driving: If you have received any sedation, be responsible and do not drive. You are not allowed to drive for 24 hours after having sedation. Blood thinner: (Applies only to those taking blood thinners) You may restart your blood thinner 6 hours after your procedure. Insulin: (Applies only to Diabetic patients taking insulin) As soon as you can eat, you may resume your normal dosing schedule. Infection prevention: Keep procedure site clean and dry. Shower daily and clean area with soap and water. Post-procedure Pain Diary: Extremely important that this be done correctly and accurately. Recorded information will be used to determine the next step in treatment. For the purpose of accuracy, follow these rules: Evaluate only the area treated. Do not report or include pain from an untreated area. For the purpose of this evaluation, ignore all other areas of pain, except for the treated area. After your procedure, avoid taking a long nap and attempting to complete the pain diary after you wake up. Instead, set your alarm clock to go off every hour, on the hour, for the initial 8 hours after the procedure. Document the duration of the numbing medicine, and the relief you are getting from it. Do not go to sleep and attempt   to complete it later. It will not be accurate. If you received sedation, it is likely that you were given a medication that may cause amnesia. Because of this, completing the diary at a later time may cause the information to be inaccurate. This information is needed to plan  your care. Follow-up appointment: Keep your post-procedure follow-up evaluation appointment after the procedure (usually 2 weeks for most procedures, 6 weeks for radiofrequencies). DO NOT FORGET to bring you pain diary with you.   Expect: (What should I expect to see with my procedure?) From numbing medicine (AKA: Local Anesthetics): Numbness or decrease in pain. You may also experience some weakness, which if present, could last for the duration of the local anesthetic. Onset: Full effect within 15 minutes of injected. Duration: It will depend on the type of local anesthetic used. On the average, 1 to 8 hours.  From steroids (Applies only if steroids were used): Decrease in swelling or inflammation. Once inflammation is improved, relief of the pain will follow. Onset of benefits: Depends on the amount of swelling present. The more swelling, the longer it will take for the benefits to be seen. In some cases, up to 10 days. Duration: Steroids will stay in the system x 2 weeks. Duration of benefits will depend on multiple posibilities including persistent irritating factors. Side-effects: If present, they may typically last 2 weeks (the duration of the steroids). Frequent: Cramps (if they occur, drink Gatorade and take over-the-counter Magnesium 450-500 mg once to twice a day); water retention with temporary weight gain; increases in blood sugar; decreased immune system response; increased appetite. Occasional: Facial flushing (red, warm cheeks); mood swings; menstrual changes. Uncommon: Long-term decrease or suppression of natural hormones; bone thinning. (These are more common with higher doses or more frequent use. This is why we prefer that our patients avoid having any injection therapies in other practices.)  Very Rare: Severe mood changes; psychosis; aseptic necrosis. From procedure: Some discomfort is to be expected once the numbing medicine wears off. This should be minimal if ice and heat are  applied as instructed.  Call if: (When should I call?) You experience numbness and weakness that gets worse with time, as opposed to wearing off. New onset bowel or bladder incontinence. (Applies only to procedures done in the spine)  Emergency Numbers: Durning business hours (Monday - Thursday, 8:00 AM - 4:00 PM) (Friday, 9:00 AM - 12:00 Noon): [phone] After hours: [phone] NOTE: If you are having a problem and are unable connect with, or to talk to a provider, then go to your nearest urgent care or emergency department. If the problem is serious and urgent, please call 911. ____________________________________________________________________________________________   

## 2020-12-01 ENCOUNTER — Telehealth: Payer: Self-pay

## 2020-12-01 DIAGNOSIS — G4733 Obstructive sleep apnea (adult) (pediatric): Secondary | ICD-10-CM | POA: Diagnosis not present

## 2020-12-01 NOTE — Telephone Encounter (Signed)
Post procedure phone call.   States she feels a lot better than she did yesterday.

## 2020-12-04 ENCOUNTER — Telehealth: Payer: Self-pay | Admitting: Pain Medicine

## 2020-12-04 ENCOUNTER — Telehealth: Payer: Self-pay

## 2020-12-04 NOTE — Telephone Encounter (Signed)
Spoke with patient and she states she is still having pain.  States she did get relief the first 6 hours after injection.  INstructed patient that she needed to wait 4-10 days for the steroid to start working.  She states she thought that was what she was suppossed to do.  Instructed her to call back ina few days if her pain did not get better or if she had further questions or concerns.

## 2020-12-04 NOTE — Telephone Encounter (Signed)
Had shots in her hip Thurs. Gone into back, she can hardly walk. Please advise patient.

## 2020-12-04 NOTE — Telephone Encounter (Signed)
Patient has called to say that she had injection into her hip and now the pain is going across her back and she is unable to walk.  Denies any pain relief from the injection that was performed on 11/30/20.  During the phone call, I was unable to hear Jamie Phillips anymore, hung up and called her back and the call went straight to voicemail.  Message left to please call back so we could finish discussion.

## 2020-12-12 ENCOUNTER — Other Ambulatory Visit: Payer: Self-pay

## 2020-12-12 ENCOUNTER — Encounter: Payer: Self-pay | Admitting: Pain Medicine

## 2020-12-12 ENCOUNTER — Ambulatory Visit
Admission: RE | Admit: 2020-12-12 | Discharge: 2020-12-12 | Disposition: A | Discharge status: Home / Self Care | Payer: Medicare HMO | Attending: Pain Medicine | Admitting: Pain Medicine

## 2020-12-12 ENCOUNTER — Telehealth: Payer: Self-pay

## 2020-12-12 ENCOUNTER — Ambulatory Visit (HOSPITAL_BASED_OUTPATIENT_CLINIC_OR_DEPARTMENT_OTHER): Payer: Medicare HMO | Admitting: Pain Medicine

## 2020-12-12 ENCOUNTER — Ambulatory Visit
Admission: RE | Admit: 2020-12-12 | Discharge: 2020-12-12 | Disposition: A | Discharge status: Home / Self Care | Payer: Medicare HMO | Source: Ambulatory Visit | Attending: Pain Medicine | Admitting: Pain Medicine

## 2020-12-12 VITALS — BP 134/78 | HR 66 | Temp 97.1°F | Resp 20 | Ht 65.0 in | Wt 259.0 lb

## 2020-12-12 DIAGNOSIS — M533 Sacrococcygeal disorders, not elsewhere classified: Secondary | ICD-10-CM

## 2020-12-12 DIAGNOSIS — M25552 Pain in left hip: Secondary | ICD-10-CM | POA: Insufficient documentation

## 2020-12-12 DIAGNOSIS — R1031 Right lower quadrant pain: Secondary | ICD-10-CM | POA: Insufficient documentation

## 2020-12-12 DIAGNOSIS — M4310 Spondylolisthesis, site unspecified: Secondary | ICD-10-CM | POA: Insufficient documentation

## 2020-12-12 DIAGNOSIS — M545 Low back pain, unspecified: Secondary | ICD-10-CM | POA: Diagnosis not present

## 2020-12-12 DIAGNOSIS — M47816 Spondylosis without myelopathy or radiculopathy, lumbar region: Secondary | ICD-10-CM | POA: Insufficient documentation

## 2020-12-12 DIAGNOSIS — M461 Sacroiliitis, not elsewhere classified: Secondary | ICD-10-CM | POA: Diagnosis not present

## 2020-12-12 DIAGNOSIS — G8929 Other chronic pain: Secondary | ICD-10-CM | POA: Diagnosis not present

## 2020-12-12 DIAGNOSIS — M7061 Trochanteric bursitis, right hip: Secondary | ICD-10-CM

## 2020-12-12 DIAGNOSIS — Z6841 Body Mass Index (BMI) 40.0 and over, adult: Secondary | ICD-10-CM | POA: Insufficient documentation

## 2020-12-12 DIAGNOSIS — M792 Neuralgia and neuritis, unspecified: Secondary | ICD-10-CM

## 2020-12-12 DIAGNOSIS — M7062 Trochanteric bursitis, left hip: Secondary | ICD-10-CM | POA: Diagnosis present

## 2020-12-12 DIAGNOSIS — M47817 Spondylosis without myelopathy or radiculopathy, lumbosacral region: Secondary | ICD-10-CM | POA: Insufficient documentation

## 2020-12-12 DIAGNOSIS — M5137 Other intervertebral disc degeneration, lumbosacral region: Secondary | ICD-10-CM | POA: Insufficient documentation

## 2020-12-12 DIAGNOSIS — M25551 Pain in right hip: Secondary | ICD-10-CM | POA: Insufficient documentation

## 2020-12-12 DIAGNOSIS — M160 Bilateral primary osteoarthritis of hip: Secondary | ICD-10-CM

## 2020-12-12 DIAGNOSIS — E559 Vitamin D deficiency, unspecified: Secondary | ICD-10-CM | POA: Diagnosis not present

## 2020-12-12 DIAGNOSIS — Z7901 Long term (current) use of anticoagulants: Secondary | ICD-10-CM | POA: Diagnosis not present

## 2020-12-12 DIAGNOSIS — R1032 Left lower quadrant pain: Secondary | ICD-10-CM | POA: Insufficient documentation

## 2020-12-12 MED ORDER — VITAMIN D3 125 MCG (5000 UT) PO CAPS: 1.0000 | ORAL_CAPSULE | Freq: Every day | ORAL | 2 refills | Status: AC

## 2020-12-12 MED ORDER — GABAPENTIN 400 MG PO CAPS: 400.0000 mg | ORAL_CAPSULE | Freq: Three times a day (TID) | ORAL | Status: DC

## 2020-12-12 MED ORDER — TURMERIC 500 MG PO CAPS: 500.0000 mg | ORAL_CAPSULE | Freq: Every day | ORAL | 0 refills | Status: AC

## 2020-12-12 MED ORDER — ORPHENADRINE CITRATE 30 MG/ML IJ SOLN
INTRAMUSCULAR | Status: AC
  Filled 2020-12-12: qty 2

## 2020-12-12 MED ORDER — ERGOCALCIFEROL 1.25 MG (50000 UT) PO CAPS: 50000.0000 [IU] | ORAL_CAPSULE | ORAL | 0 refills | Status: AC

## 2020-12-12 MED ORDER — MAGNESIUM OXIDE -MG SUPPLEMENT 500 MG PO CAPS: 1.0000 | ORAL_CAPSULE | Freq: Two times a day (BID) | ORAL | 0 refills | Status: DC

## 2020-12-12 MED ORDER — KETOROLAC TROMETHAMINE 60 MG/2ML IM SOLN
INTRAMUSCULAR | Status: AC
  Filled 2020-12-12: qty 2

## 2020-12-12 MED ORDER — KETOROLAC TROMETHAMINE 60 MG/2ML IM SOLN
60.0000 mg | Freq: Once | INTRAMUSCULAR | Status: AC
  Administered 2020-12-12: 60 mg via INTRAMUSCULAR

## 2020-12-12 MED ORDER — ORPHENADRINE CITRATE 30 MG/ML IJ SOLN
60.0000 mg | Freq: Once | INTRAMUSCULAR | Status: AC
  Administered 2020-12-12: 60 mg via INTRAMUSCULAR

## 2020-12-12 MED ORDER — CALCIUM CARBONATE 1500 (600 CA) MG PO TABS: 600.0000 mg | ORAL_TABLET | Freq: Two times a day (BID) | ORAL | 2 refills | Status: DC

## 2020-12-12 NOTE — Progress Notes (Signed)
PROVIDER NOTE: Information contained herein reflects review and annotations entered in association with encounter. Interpretation of such information and data should be left to medically-trained personnel. Information provided to patient can be located elsewhere in the medical record under "Patient Instructions". Document created using STT-dictation technology, any transcriptional errors that may result from process are unintentional.    Patient: Jamie Phillips  Service Category: E/M  Provider: Gaspar Cola, MD  DOB: Aug 10, 1940  DOS: 12/12/2020  Specialty: Interventional Pain Management  MRN: [id number]  Setting: Ambulatory outpatient  PCP: Kirk Ruths, MD  Type: Established Patient    Referring Provider: Kirk Ruths, MD  Location: Office  Delivery: Face-to-face     HPI  Jamie Phillips, a 80 y.o. year old female, is here today because of her Vitamin D deficiency [E55.9]. Jamie Phillips primary complain today is Back Pain (low) Last encounter: My last encounter with her was on 12/04/2020. Pertinent problems: Jamie Phillips has Pain in the chest; CLL (chronic lymphocytic leukemia) ([place]); DJD (degenerative joint disease); Chronic lower extremity pain (1ry area of Pain) (Bilateral); Right lower quadrant pain; DDD (degenerative disc disease), lumbosacral; Generalized OA; History of Bell's palsy; Pes anserine bursitis; RLS (restless legs syndrome); Walker as ambulation aid; Chronic pain syndrome; Chronic low back pain (2ry area of Pain) (Bilateral) w/ sciatica (Bilateral); Lumbosacral radiculopathy at L1 (Bilateral); Lumbosacral radiculopathy at L5 (Left); Lumbosacral radiculopathy at S1 (Right); 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); Abnormal MRI, lumbar spine (01/24/2020); Lumbar facet syndrome (Multilevel) (Bilateral); Lumbar facet hypertrophy (Multilevel) (Bilateral); Severe muscle deconditioning; Lower extremity weakness (Bilateral); Lumbosacral facet  hypertrophy (Multilevel) (Bilateral); Lumbar scoliosis (idiopathic); Spondylolisthesis of lumbosacral region (Multilevel); Retrolisthesis (L1/L2); Anterolisthesis of lumbar spine (L3/L4, L4/L5); Anterolisthesis of lumbosacral spine (L5/S1); Lumbar lateral recess stenosis (Bilateral: L3-4, L4-5) (Severe); Lumbar foraminal stenosis (Right: L4-5); Synovial cyst of lumbar facet joint (L3-4) (Right); Arthropathy of spinal facet joint concurrent with and due to effusion (L3-4); Annular tear of lumbar disc (L3-4) (Left); Chronic lower extremity radicular pain (L1/L2 dermatomes) (Bilateral); Chronic groin pain (Bilateral); Neurogenic pain, lower extremity (Bilateral); Shooting pain, intermittent (electrical-like); History of total knee replacement (Bilateral) ; Spondylosis without myelopathy or radiculopathy, lumbosacral region; Chronic low back pain (Bilateral) w/o sciatica; EMG (electromyogram) abnormalities (05/09/2020); Abnormal CT scan, lumbar spine (06/16/2020); Chronic hip pain (Bilateral); Trochanteric bursitis of hips (Bilateral); Iliopsoas bursitis of hips; Decreased range of motion of lumbar spine; Osteoarthritis of hips (Bilateral); Sacroiliac joint dysfunction of right side; Chronic right sacroiliac joint pain; and 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HCC) on their pertinent problem list. Pain Assessment: Severity of Chronic pain is reported as a 10-Worst pain ever/10. Location: Back Lower/radiates down both lwgs in the back to knees. Onset: More than a month ago. Quality: Sharp. Timing: Constant. Modifying factor(s): nothing. Vitals:  height is '5\' 5"'  (1.651 m) and weight is 259 lb (117.5 kg). Her temperature is 97.1 F (36.2 C) (abnormal). Her blood pressure is 134/78 and her pulse is 66. Her respiration is 20 and oxygen saturation is 100%.   Reason for encounter: post-procedure assessment.  The patient called our clinics today requesting to come in early secondary to an increase in her low back pain.   She continues to be morbidly obese and this represents a significant factor in what is going on with her.  She comes in today with bilateral low back pain going down the back of the leg to the level of the knee.  Physical exam today was positive bilaterally for SI  joint pain with the right side being worse than the left one provocative Patrick maneuver as well as bilateral lumbar facet arthralgia on provocative hyperextension and rotation maneuver and Kemp maneuver.  Previously we had done 2 diagnostic bilateral lumbar facet blocks with excellent relief of the pain.  She is having more than 1 thing going on with her.  Her weight has led to bilateral multilevel lumbar facet hypertrophy and arthropathy causing a bilateral lumbar facet syndrome with pain going down the back of the leg with a referred pattern typical of the facet syndrome.  This facet arthropathy combined with her weight has led to spondylolisthesis at 2 levels which in turn is contributing to her back pain and lower extremity pain.  Today I asked the patient if he had gone to her referral for weight management and she indicated that "nobody told her about that".  Today I am reentering those referrals since she needs to bring her BMI down to 30 or less.  Patrick maneuver today also demonstrated bilateral hip joint arthralgia with decreased range of motion primarily on the right hip.  For this reason today we will be ordering an MRI of that hip.  Today we have given the patient an IM injection of Toradol/Norflex 60/60.  Review of her lab work shows that her last comprehensive metabolic panel showed some hypocalcemia as well as a vitamin D deficiency.  For this reason I will be sending prescriptions to the pharmacy to correct those.  I have encouraged for her to continue taking the gabapentin which is being prescribed by Dr. Ouida Sills.  Apparently he increased it to 400 mg p.o. 3 times daily, but she does not seem to be taking it regularly because it  causes her to be somnolent during the day and she does not like this.  Because she is not taking it in a regular manner, this means that her blood levels are fluctuating and this is probably responsible for some of the problems that she is having on a PRN basis.  I have also added to her regimen some over-the-counter turmeric and magnesium to work as an anti-inflammatory and a muscle relaxant, respectively.  Post-Procedure Evaluation  Procedure (11/30/2020):  Procedure #1:  Anesthesia, Analgesia, Anxiolysis:  Type: Intra-Articular Hip Injection          Primary Purpose: Diagnostic Region: Posterolateral hip joint area. Level: Lower pelvic and hip joint level. Target Area: Superior aspect of the hip joint cavity, going thru the superior portion of the capsular ligament. Approach: Posterolateral approach. Laterality: Bilateral  Type: Local Anesthesia Indication(s): Analgesia         Route: Infiltration ([place]/IM) IV Access: Declined Sedation: Declined  Local Anesthetic: Lidocaine 1-2%  Position: Prone Area Prepped: Entire Posterolateral hip area. DuraPrep (Iodine Povacrylex [0.7% available iodine] and Isopropyl Alcohol, 74% w/w)   Procedure #2:    Type: Gluteofemoral Bursa Injection          Primary Purpose: Diagnostic Region: Upper (proximal) Femoral Region Level: Hip Joint Target Area: Superior aspect of the hip joint cavity, going thru the superior portion of the capsular ligament. Approach: Posterolateral approach Laterality: Bilateral     Indications: 1. Chronic hip pain (Bilateral)   2. Iliopsoas bursitis of hips   3. Osteoarthritis of hips (Bilateral)   4. Trochanteric bursitis of hips (Bilateral)   5. Chronic anticoagulation (Eliquis)    Pain Score: Pre-procedure: 8 /10 Post-procedure: 0-No pain/10   Anxiolysis: none.  Effectiveness during initial hour after procedure (Ultra-Short Term Relief):  100 %.  Local anesthetic used: Long-acting (4-6 hours) Effectiveness:  Defined as any analgesic benefit obtained secondary to the administration of local anesthetics. This carries significant diagnostic value as to the etiological location, or anatomical origin, of the pain. Duration of benefit is expected to coincide with the duration of the local anesthetic used.  Effectiveness during initial 4-6 hours after procedure (Short-Term Relief): 100 %.  Long-term benefit: Defined as any relief past the pharmacologic duration of the local anesthetics.  Effectiveness past the initial 6 hours after procedure (Long-Term Relief): 80 %.  Benefits, current: Defined as benefit present at the time of this evaluation.   Analgesia:  Back to baseline Function: Back to baseline ROM: Back to baseline  Pharmacotherapy Assessment  Analgesic: Tramadol 50 mg, 1 tab p.o. 4 times daily (200 mg/day of tramadol) MME/day: 20 mg/day   Monitoring: [place] PMP: PDMP reviewed during this encounter.       Pharmacotherapy: No side-effects or adverse reactions reported. Compliance: No problems identified. Effectiveness: Clinically acceptable.  No notes on file  UDS:  Summary  Date Value Ref Range Status  07/27/2020 Note  Final    Comment:    ==================================================================== [employer] Select 13 (MW) ==================================================================== Test                             Result       Flag       Units  Drug Present and Declared for Prescription Verification   Oxycodone                      1206         EXPECTED   ng/mg creat   Oxymorphone                    90           EXPECTED   ng/mg creat   Noroxycodone                   2368         EXPECTED   ng/mg creat    Sources of oxycodone include scheduled prescription medications.    Oxymorphone and noroxycodone are expected metabolites of oxycodone.    Oxymorphone is also available as a scheduled prescription  medication.  ==================================================================== Test                      Result    Flag   Units      Ref Range   Creatinine              212              mg/dL      >=20 ==================================================================== Declared Medications:  The flagging and interpretation on this report are based on the  following declared medications.  Unexpected results may arise from  inaccuracies in the declared medications.   **Note: The testing scope of this panel includes these medications:   Oxycodone   **Note: The testing scope of this panel does not include the  following reported medications:   Acetaminophen (Tylenol)  Albuterol (Ventolin HFA)  Apixaban (Eliquis)  Cholestyramine (Questran)  Duloxetine (Cymbalta)  Escitalopram (Lexapro)  Gabapentin (Neurontin)  Hydrochlorothiazide (Hyzaar)  Losartan (Hyzaar)  Pantoprazole (Protonix)  Pravastatin (Pravachol)  Sucralfate (Carafate) ==================================================================== For clinical consultation, please call [phone]. ====================================================================      ROS  Constitutional: Denies any  fever or chills Gastrointestinal: No reported hemesis, hematochezia, vomiting, or acute GI distress Musculoskeletal: Denies any acute onset joint swelling, redness, loss of ROM, or weakness Neurological: No reported episodes of acute onset apraxia, aphasia, dysarthria, agnosia, amnesia, paralysis, loss of coordination, or loss of consciousness  Medication Review  DULoxetine, Magnesium Oxide, Multiple Vitamins-Minerals, Turmeric, Vitamin D3, acetaminophen, albuterol, apixaban, calcium carbonate, cholestyramine, ergocalciferol, escitalopram, gabapentin, losartan-hydrochlorothiazide, oxyCODONE, pantoprazole, pravastatin, propranolol, and sucralfate  History Review  Allergy: Jamie Phillips has No Known Allergies. Drug: Ms.  Phillips  reports no history of drug use. Alcohol:  reports no history of alcohol use. Tobacco:  reports that she has never smoked. She has never used smokeless tobacco. Social: Jamie Phillips  reports that she has never smoked. She has never used smokeless tobacco. She reports that she does not drink alcohol and does not use drugs. 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), PVD (peripheral vascular disease) ([place]), and Sleep apnea. Surgical: Jamie Phillips  has a past surgical history that includes neck fusion; Wrist surgery (Left, 2000); Cardiac catheterization (N/A, 08/15/2015); IVC FILTER INSERTION (N/A, 06/11/2016); Colonoscopy; Joint replacement (Left, 06/17/2016); Colonoscopy with propofol (N/A, 03/10/2017); IVC FILTER REMOVAL (N/A, 05/06/2017); laparoscopy (N/A, 10/10/2018); laparoscopic appendectomy (N/A, 10/10/2018); Partial colectomy (Right, 10/10/2018); Appendectomy; Esophagogastroduodenoscopy (egd) with propofol (N/A, 02/02/2019); and Breast biopsy (Left, 08/19/04). Family: family history includes Congestive Heart Failure in her father; Heart failure in her father; Pulmonary embolism in her mother.  Laboratory Chemistry Profile   Renal Lab Results  Component Value Date   BUN 19 09/15/2020   CREATININE 0.98 09/15/2020   BCR 17 06/21/2020   GFRAA 55 (L) 09/03/2019   GFRNONAA 59 (L) 09/15/2020    Hepatic Lab Results  Component Value Date   AST 16 09/15/2020   ALT 16 09/15/2020   ALBUMIN 3.5 09/15/2020   ALKPHOS 83  09/15/2020   LIPASE 37 10/09/2018    Electrolytes Lab Results  Component Value Date   NA 142 09/15/2020   K 3.7 09/15/2020   CL 104 09/15/2020   CALCIUM 8.7 (L) 09/15/2020   MG 1.9 06/21/2020    Bone Lab Results  Component Value Date   25OHVITD1 14 (L) 06/21/2020   25OHVITD2 <1.0 06/21/2020   25OHVITD3 14 06/21/2020    Inflammation (CRP: Acute Phase) (ESR: Chronic Phase) Lab Results  Component Value Date   CRP 9 06/21/2020   ESRSEDRATE 21 06/21/2020   LATICACIDVEN 0.9 12/13/2018         Note: Above Lab results reviewed.  Recent Imaging Review  DG PAIN CLINIC C-ARM 1-60 MIN NO REPORT Fluoro was used, but no Radiologist interpretation will be provided.  Please refer to "NOTES" tab for provider progress note. Note: Reviewed        Physical Exam  General appearance: Well nourished, well developed, and well hydrated. In no apparent acute distress Mental status: Alert, oriented x 3 (person, place, & time)       Respiratory: No evidence of acute respiratory distress Eyes: PERLA Vitals: BP 134/78 (BP Location: Right Arm, Patient Position: Sitting, Cuff Size: Large)   Pulse 66   Temp (!) 97.1 F (36.2 C)   Resp 20   Ht 5'  5" (1.651 m)   Wt 259 lb (117.5 kg)   SpO2 100%   BMI 43.10 kg/m  BMI: Estimated body mass index is 43.1 kg/m as calculated from the following:   Height as of this encounter: '5\' 5"'  (1.651 m).   Weight as of this encounter: 259 lb (117.5 kg). Ideal: Ideal body weight: 57 kg (125 lb 10.6 oz) Adjusted ideal body weight: 81.2 kg (179 lb)  Assessment   Status Diagnosis  Controlled Controlled Controlled 1. Vitamin D deficiency   2. Hypocalcemia   3. Acute exacerbation of chronic low back pain   4. Chronic hip pain (Bilateral)   5. DDD (degenerative disc disease), lumbosacral   6. Retrolisthesis (L1/L2)   7. Lumbar facet hypertrophy (Multilevel) (Bilateral)   8. Lumbar facet syndrome (Multilevel) (Bilateral)   9. Lumbosacral facet hypertrophy  (Multilevel) (Bilateral)   10. Chronic anticoagulation (Eliquis)   11. Morbid obesity with BMI of 40.0-44.9, adult ([place])   12. Chronic groin pain (Bilateral)   13. Trochanteric bursitis of hips (Bilateral)   14. Osteoarthritis of hips (Bilateral)   15. Sacroiliac joint dysfunction of right side   16. Chronic right sacroiliac joint pain   17. 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[place])   18. Neurogenic pain, leg, unspecified laterality      Updated Problems: Problem  Sacroiliac Joint Dysfunction of Right Side  Chronic Right Sacroiliac Joint Pain  Osteoarthritis of Right Sacroiliac Joint (Hcc)  Hypocalcemia    Plan of Care  Problem-specific:  No problem-specific Assessment & Plan notes found for this encounter.  Jamie Phillips has a current medication list which includes the following long-term medication(s): albuterol, calcium carbonate, cholestyramine, duloxetine, eliquis, escitalopram, gabapentin, losartan-hydrochlorothiazide, oxycodone, [START ON 12/17/2020] oxycodone, [START ON 01/16/2021] oxycodone, and pantoprazole.  Pharmacotherapy (Medications Ordered): Meds ordered this encounter  Medications   ketorolac (TORADOL) injection 60 mg   orphenadrine (NORFLEX) injection 60 mg   calcium carbonate (OSCAL) 1500 (600 Ca) MG TABS tablet    Sig: Take 1 tablet (1,500 mg total) by mouth 2 (two) times daily with a meal.    Dispense:  60 tablet    Refill:  2    Fill one day early if pharmacy is closed on scheduled refill date. May substitute for generic, or similar, if available.   Magnesium Oxide 500 MG CAPS    Sig: Take 1 capsule (500 mg total) by mouth 2 (two) times daily at 8 am and 10 pm.    Dispense:  180 capsule    Refill:  0    Fill one day early if pharmacy is closed on scheduled refill date. May substitute for generic if available.   Turmeric 500 MG CAPS    Sig: Take 500 mg by mouth daily.    Dispense:  90 capsule    Refill:  0    OTC Recommendation. Please  provide information on: where to find; how to take; side-effects; adverse reactions; drug-to-drug interactions; and contraindications. If unavailable, recommend similar substitute.   ergocalciferol (VITAMIN D2) 1.25 MG (50000 UT) capsule    Sig: Take 1 capsule (50,000 Units total) by mouth 2 (two) times a week. X 6 weeks.    Dispense:  12 capsule    Refill:  0    Fill one day early if pharmacy is closed on scheduled refill date. May substitute for generic, or similar, if available.   Cholecalciferol (VITAMIN D3) 125 MCG (5000 UT) CAPS    Sig: Take 1 capsule (5,000 Units total)  by mouth daily with breakfast. Take along with calcium and magnesium.    Dispense:  30 capsule    Refill:  2    Fill 1 day early if pharmacy is closed on scheduled refill date. Generic permitted. Do not send renewal requests.   gabapentin (NEURONTIN) 400 MG capsule    Sig: Take 1 capsule (400 mg total) by mouth 3 (three) times daily.    Dispense:  90 capsule    Fill one day early if pharmacy is closed on scheduled refill date. Generic permitted. Do not send renewal requests. Void any older duplicate prescription or refill(s) that may be on file.    Orders:  Orders Placed This Encounter  Procedures   LUMBAR FACET(MEDIAL BRANCH NERVE BLOCK) MBNB    Standing Status:   Future    Standing Expiration Date:   01/12/2021    Scheduling Instructions:     Procedure: Lumbar facet block (AKA.: Lumbosacral medial branch nerve block)     Side: Bilateral     Level: L3-4, L4-5, & L5-S1 Facets (L2, L3, L4, L5, & S1 Medial Branch Nerves)     Sedation: Patient's choice.     Timeframe: ASAA    Order Specific Question:   Where will this procedure be performed?    Answer:   [facility]   MR HIP RIGHT WO CONTRAST    Standing Status:   Future    Standing Expiration Date:   01/12/2021    Scheduling Instructions:     Imaging must be done as soon as possible. Inform patient that order will expire within 30 days and I will not  renew it.    Order Specific Question:   What is the patient's sedation requirement?    Answer:   No Sedation    Order Specific Question:   Does the patient have a pacemaker or implanted devices?    Answer:   No    Order Specific Question:   Preferred imaging location?    Answer:   [facility] (table limit-350lbs)    Order Specific Question:   Call Results- Best Contact Number?    Answer:   [phone] ([facility])    Order Specific Question:   Radiology Contrast Protocol - do NOT remove file path    Answer:   [email or web address]   DG Si Joints    Standing Status:   Future    Number of Occurrences:   1    Standing Expiration Date:   01/12/2021    Scheduling Instructions:     Imaging must be done as soon as possible. Inform patient that order will expire within 30 days and I will not renew it.    Order Specific Question:   Reason for Exam (SYMPTOM  OR DIAGNOSIS REQUIRED)    Answer:   Sacroiliac joint pain    Order Specific Question:   Preferred imaging location?    Answer:   [facility]    Order Specific Question:   Call Results- Best Contact Number?    Answer:   [phone] ([facility])    Order Specific Question:   Release to patient    Answer:   Immediate   Amb Ref to Medical Weight Management    Referral Priority:   Routine    Referral Type:   Consultation    Referral Reason:   Specialty Services Required    Number of Visits Requested:   1   Amb Referral to Bariatric Surgery    Referral  Priority:   Routine    Referral Type:   Consultation    Referral Reason:   Specialty Services Required    Number of Visits Requested:   1   Amb Ref to Medical Weight Management    Referral Priority:   Routine    Referral Type:   Consultation    Referral Reason:   Specialty Services Required    Number of Visits Requested:   1   Informed Consent Details: Physician/Practitioner Attestation; Transcribe to consent form and obtain patient  signature    Nursing Order: Transcribe to consent form and obtain patient signature. Note: Always confirm laterality of pain with Jamie Phillips, before procedure.    Order Specific Question:   Physician/Practitioner attestation of informed consent for procedure/surgical case    Answer:   I, the physician/practitioner, attest that I have discussed with the patient the benefits, risks, side effects, alternatives, likelihood of achieving goals and potential problems during recovery for the procedure that I have provided informed consent.    Order Specific Question:   Procedure    Answer:   Lumbar Facet Block  under fluoroscopic guidance    Order Specific Question:   Physician/Practitioner performing the procedure    Answer:   Miachel Nardelli A. 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   Blood Thinner Instructions to Nursing    Always make sure patient has clearance from prescribing physician to stop blood thinners for interventional therapies. If the patient requires a Lovenox-bridge therapy, make sure arrangements are made to institute it with the assistance of the PCP.    Scheduling Instructions:     Have Jamie Phillips stop the Eliquis (Apixaban) x 3 days prior to procedure or surgery.    Follow-up plan:   Return for (Clinic) procedure: (B) L-FCT BLK #3, (Blood Thinner Protocol).     Interventional Therapies  Risk  Complexity Considerations:   Estimated body mass index is 43.6 kg/m as calculated from the following:   Height as of this encounter: '5\' 5"'  (1.651 m).   Weight as of this encounter: 262 lb (118.8 kg). NOTE: Eliquis Anticoagulation: (Stop: 3 days  Restart: 6 hrs)   Planned  Pending:   Diagnostic bilateral IA hip joint injection #1  Diagnostic bilateral trochanteric bursa injection  #1    Under consideration:   Diagnostic bilateral IA hip joint injection #1  Diagnostic bilateral trochanteric bursa injection #1  Diagnostic bilateral iliopsoas bursa injection #1  Diagnostic midline T12-L1 LESI    Completed:   Diagnostic bilateral lumbar facet block x1 (08/15/2020) (100/100/50/50)  Diagnostic bilateral L3 TFESI x2 (09/05/2020) (100/100/100/100% relief of the bilateral L1/L2 radiculitis)  Diagnostic right L2-3 LESI x1 (07/11/2020) (100/100/100/100% relief of the bilateral L1/L2 radiculitis)    Therapeutic  Palliative (PRN) options:   Therapeutic right L2-3 LESI #2  Therapeutic bilateral L3 transforaminal ESI #3  Diagnostic bilateral lumbar facet MBB #2     Recent Visits Date Type Provider Dept  11/30/20 Procedure visit Milinda Pointer, MD [facility]  11/21/20 Procedure visit Milinda Pointer, MD [facility]  11/15/20 Office Visit Milinda Pointer, MD [facility]  11/07/20 Procedure visit Milinda Pointer, MD [facility]  10/18/20 Telemedicine Milinda Pointer, MD [facility]  10/05/20 Procedure visit Milinda Pointer, MD [facility]  09/21/20 Telemedicine Milinda Pointer, MD [facility]  Showing recent visits within past 90 days and meeting all other requirements Today's Visits Date Type Provider Dept  12/12/20 Office Visit Milinda Pointer, MD [facility]  Showing today's visits and meeting all other requirements Future Appointments Date Type Provider Dept  02/07/21 Appointment Milinda Pointer, MD [facility]  Showing future appointments within next 90 days and meeting all other requirements I discussed the assessment and treatment plan with the patient. The patient was provided an opportunity to ask questions and all were answered. The patient agreed with the plan and demonstrated an understanding of the instructions.  Patient advised to call back or  seek an in-person evaluation if the symptoms or condition worsens.  Duration of encounter: 32 minutes.  Note by: Gaspar Cola, MD Date: 12/12/2020; Time: 5:32 PM

## 2020-12-12 NOTE — Telephone Encounter (Signed)
Pt wants to know what else Dr.N can do she is in a lot of pain and is out of her pain meds.

## 2020-12-12 NOTE — Telephone Encounter (Signed)
There has not been a follow-up appt since the last procedure on 11-30-20. Spoke with patient, advised her to schedule appt. Call transferred to secretaries to schedule.

## 2020-12-12 NOTE — Patient Instructions (Addendum)
______________________________________________________________________________________________  Body mass index (BMI)  Body mass index (BMI) is a common tool for deciding whether a person has an appropriate body weight.  It measures a persons weight in relation to their height.   According to the [facility] of health (NIH): A BMI of less than 18.5 means that a person is underweight. A BMI of between 18.5 and 24.9 is ideal. A BMI of between 25 and 29.9 is overweight. A BMI over 30 indicates obesity.  Weight Management Required  URGENT: Your weight has been found to be adversely affecting your health.  Dear Jamie Phillips:  Your current Estimated body mass index is 43.1 kg/m as calculated from the following:   Height as of this encounter: '5\' 5"'  (1.651 m).   Weight as of this encounter: 259 lb (117.5 kg).  Please use the table below to identify your weight category and associated incidence of chronic pain, secondary to your weight.  Body Mass Index (BMI) Classification BMI level (kg/m2) Category Associated incidence of chronic pain  <18  Underweight   18.5-24.9 Ideal body weight   25-29.9 Overweight  20%  30-34.9 Obese (Class I)  68%  35-39.9 Severe obesity (Class II)  136%  >40 Extreme obesity (Class III)  254%   In addition: You will be considered "Morbidly Obese", if your BMI is above 30 and you have one or more of the following conditions which are known to be caused and/or directly associated with obesity: 1.    Type 2 Diabetes (Which in turn can lead to cardiovascular diseases (CVD), stroke, peripheral vascular diseases (PVD), retinopathy, nephropathy, and neuropathy) 2.    Cardiovascular Disease (High Blood Pressure; Congestive Heart Failure; High Cholesterol; Coronary Artery Disease; Angina; or History of Heart Attacks) 3.    Breathing problems (Asthma; obesity-hypoventilation syndrome; obstructive sleep apnea; chronic inflammatory airway disease; reactive airway  disease; or shortness of breath) 4.    Chronic kidney disease 5.    Liver disease (nonalcoholic fatty liver disease) 6.    High blood pressure 7.    Acid reflux (gastroesophageal reflux disease; heartburn) 8.    Osteoarthritis (OA) (with any of the following: hip pain; knee pain; and/or low back pain) 9.    Low back pain (Lumbar Facet Syndrome; and/or Degenerative Disc Disease) 10.  Hip pain (Osteoarthritis of hip) (For every 1 lbs of added body weight, there is a 2 lbs increase in pressure inside of each hip articulation. 1:2 mechanical relationship) 11.  Knee pain (Osteoarthritis of knee) (For every 1 lbs of added body weight, there is a 4 lbs increase in pressure inside of each knee articulation. 1:4 mechanical relationship) (patients with a BMI>30 kg/m2 were 6.8 times more likely to develop knee OA than normal-weight individuals) 12.  Cancer: Epidemiological studies have shown that obesity is a risk factor for: post-menopausal breast cancer; cancers of the endometrium, colon and kidney cancer; malignant adenomas of the oesophagus. Obese subjects have an approximately 1.5-3.5-fold increased risk of developing these cancers compared with normal-weight subjects, and it has been estimated that between 15 and 45% of these cancers can be attributed to overweight. More recent studies suggest that obesity may also increase the risk of other types of cancer, including pancreatic, hepatic and gallbladder cancer. (Ref: Obesity and cancer. Pischon T, Nthlings U, [employer] H. Proc Nutr Soc. 2008 May;67(2):128-45. doi: 24.2683/M1962229798921194.) The International Agency for Research on Cancer (IARC) has identified 13 cancers associated with overweight and obesity: meningioma, multiple myeloma, adenocarcinoma of the esophagus, and cancers of  the thyroid, postmenopausal breast cancer, gallbladder, stomach, liver, pancreas, kidney, ovaries, uterus, colon and rectal (colorectal) cancers. 36 percent of all cancers  diagnosed in women and 24 percent of those diagnosed in men are associated with overweight and obesity.  Recommendation: At this point it is urgent that you take a step back and concentrate in loosing weight. Dedicate 100% of your efforts on this task. Nothing else will improve your health more than bringing your weight down and your BMI to less than 30. If you are here, you probably have chronic pain. We know that most chronic pain patients have difficulty exercising secondary to their pain. For this reason, you must rely on proper nutrition and diet in order to lose the weight. If your BMI is above 40, you should seriously consider bariatric surgery. A realistic goal is to lose 10% of your body weight over a period of 12 months.  Be honest to yourself, if over time you have unsuccessfully tried to lose weight, then it is time for you to seek professional help and to enter a medically supervised weight management program, and/or undergo bariatric surgery. Stop procrastinating.   Pain management considerations:  1.    Pharmacological Problems: Be advised that the use of opioid analgesics (oxycodone; hydrocodone; morphine; methadone; codeine; and all of their derivatives) have been associated with decreased metabolism and weight gain.  For this reason, should we see that you are unable to lose weight while taking these medications, it may become necessary for [name] to taper down and indefinitely discontinue them.  2.    Technical Problems: The incidence of successful interventional therapies decreases as the patient's BMI increases. It is much more difficult to accomplish a safe and effective interventional therapy on a patient with a BMI above 35. 3.    Radiation Exposure Problems: The x-rays machine, used to accomplish injection therapies, will automatically increase their x-ray output in order to capture an appropriate bone image. This means that radiation exposure increases exponentially with the patient's  BMI. (The higher the BMI, the higher the radiation exposure.) Although the level of radiation used at a given time is still safe to the patient, it is not for the physician and/or assisting staff. Unfortunately, radiation exposure is accumulative. Because physicians and the staff have to do procedures and be exposed on a daily basis, this can result in health problems such as cancer and radiation burns. Radiation exposure to the staff is monitored by the radiation batches that they wear. The exposure levels are reported back to the staff on a quarterly basis. Depending on levels of exposure, physicians and staff may be obligated by law to decrease this exposure. This means that they have the right and obligation to refuse providing therapies where they may be overexposed to radiation. For this reason, physicians may decline to offer therapies such as radiofrequency ablation or implants to patients with a BMI above 40. 4.    Current Trends: Be advised that the current trend is to no longer offer certain therapies to patients with a BMI equal to, or above 35, due to increase perioperative risks, increased technical procedural difficulties, and excessive radiation exposure to healthcare personnel.  ______________________________________________________________________________________________   ______________________________________________________________________  Preparing for Procedure with Sedation  NOTICE: Due to recent regulatory changes, starting on November 20, 2020, procedures requiring intravenous (IV) sedation will no longer be performed at the [facility].  These types of procedures are required to be performed at [facility] ambulatory surgery facility.  We are  very sorry for the inconvenience.  Procedure appointments are limited to planned procedures: No Prescription Refills. No disability issues will be discussed. No medication changes will be discussed.  Instructions: Oral Intake: Do not  eat or drink anything for at least 8 hours prior to your procedure. (Exception: Blood Pressure Medication. See below.) Transportation: A driver is required. You may not drive yourself after the procedure. Blood Pressure Medicine: Do not forget to take your blood pressure medicine with a sip of water the morning of the procedure. If your Diastolic (lower reading) is above 100 mmHg, elective cases will be cancelled/rescheduled. Blood thinners: These will need to be stopped for procedures. Notify our staff if you are taking any blood thinners. Depending on which one you take, there will be specific instructions on how and when to stop it. Diabetics on insulin: Notify the staff so that you can be scheduled 1st case in the morning. If your diabetes requires high dose insulin, take only  of your normal insulin dose the morning of the procedure and notify the staff that you have done so. Preventing infections: Shower with an antibacterial soap the morning of your procedure. Build-up your immune system: Take 1000 mg of Vitamin C with every meal (3 times a day) the day prior to your procedure. Antibiotics: Inform the staff if you have a condition or reason that requires you to take antibiotics before dental procedures. Pregnancy: If you are pregnant, call and cancel the procedure. Sickness: If you have a cold, fever, or any active infections, call and cancel the procedure. Arrival: You must be in the facility at least 30 minutes prior to your scheduled procedure. Children: Do not bring children with you. Dress appropriately: Bring dark clothing that you would not mind if they get stained. Valuables: Do not bring any jewelry or valuables.  Reasons to call and reschedule or cancel your procedure: (Following these recommendations will minimize the risk of a serious complication.) Surgeries: Avoid having procedures within 2 weeks of any surgery. (Avoid for 2 weeks before or after any surgery). Flu Shots: Avoid  having procedures within 2 weeks of a flu shots. (Avoid for 2 weeks before or after immunizations). Barium: Avoid having a procedure within 7-10 days after having had a radiological study involving the use of radiological contrast. (Myelograms, Barium swallow or enema study). Heart attacks: Avoid any elective procedures or surgeries for the initial 6 months after a "Myocardial Infarction" (Heart Attack). Blood thinners: It is imperative that you stop these medications before procedures. Let us know if you if you take any blood thinner.  Infection: Avoid procedures during or within two weeks of an infection (including chest colds or gastrointestinal problems). Symptoms associated with infections include: Localized redness, fever, chills, night sweats or profuse sweating, burning sensation when voiding, cough, congestion, stuffiness, runny nose, sore throat, diarrhea, nausea, vomiting, cold or Flu symptoms, recent or current infections. It is specially important if the infection is over the area that we intend to treat. Heart and lung problems: Symptoms that may suggest an active cardiopulmonary problem include: cough, chest pain, breathing difficulties or shortness of breath, dizziness, ankle swelling, uncontrolled high or unusually low blood pressure, and/or palpitations. If you are experiencing any of these symptoms, cancel your procedure and contact your primary care physician for an evaluation.  Remember:  Regular Business hours are:  Monday to Thursday 8:00 AM to 4:00 PM  Provider's Schedule: Milinda Pointer, MD:  Procedure days: Tuesday and Thursday 7:30 AM to 4:00 PM  Gillis Santa, MD:  Procedure days: Monday and Wednesday 7:30 AM to 4:00 PM ______________________________________________________________________  ____________________________________________________________________________________________  General Risks and Possible Complications  Patient Responsibilities: It is important  that you read this as it is part of your informed consent. It is our duty to inform you of the risks and possible complications associated with treatments offered to you. It is your responsibility as a patient to read this and to ask questions about anything that is not clear or that you believe was not covered in this document.  Patient's Rights: You have the right to refuse treatment. You also have the right to change your mind, even after initially having agreed to have the treatment done. However, under this last option, if you wait until the last second to change your mind, you may be charged for the materials used up to that point.  Introduction: Medicine is not an Chief Strategy Officer. Everything in Medicine, including the lack of treatment(s), carries the potential for danger, harm, or loss (which is by definition: Risk). In Medicine, a complication is a secondary problem, condition, or disease that can aggravate an already existing one. All treatments carry the risk of possible complications. The fact that a side effects or complications occurs, does not imply that the treatment was conducted incorrectly. It must be clearly understood that these can happen even when everything is done following the highest safety standards.  No treatment: You can choose not to proceed with the proposed treatment alternative. The "PRO(s)" would include: avoiding the risk of complications associated with the therapy. The "CON(s)" would include: not getting any of the treatment benefits. These benefits fall under one of three categories: diagnostic; therapeutic; and/or palliative. Diagnostic benefits include: getting information which can ultimately lead to improvement of the disease or symptom(s). Therapeutic benefits are those associated with the successful treatment of the disease. Finally, palliative benefits are those related to the decrease of the primary symptoms, without necessarily curing the condition (example:  decreasing the pain from a flare-up of a chronic condition, such as incurable terminal cancer).  General Risks and Complications: These are associated to most interventional treatments. They can occur alone, or in combination. They fall under one of the following six (6) categories: no benefit or worsening of symptoms; bleeding; infection; nerve damage; allergic reactions; and/or death. No benefits or worsening of symptoms: In Medicine there are no guarantees, only probabilities. No healthcare provider can ever guarantee that a medical treatment will work, they can only state the probability that it may. Furthermore, there is always the possibility that the condition may worsen, either directly, or indirectly, as a consequence of the treatment. Bleeding: This is more common if the patient is taking a blood thinner, either prescription or over the counter (example: Goody Powders, Fish oil, Aspirin, Garlic, etc.), or if suffering a condition associated with impaired coagulation (example: Hemophilia, cirrhosis of the liver, low platelet counts, etc.). However, even if you do not have one on these, it can still happen. If you have any of these conditions, or take one of these drugs, make sure to notify your treating physician. Infection: This is more common in patients with a compromised immune system, either due to disease (example: diabetes, cancer, human immunodeficiency virus [HIV], etc.), or due to medications or treatments (example: therapies used to treat cancer and rheumatological diseases). However, even if you do not have one on these, it can still happen. If you have any of these conditions, or take one of these drugs, make sure  to notify your treating physician. Nerve Damage: This is more common when the treatment is an invasive one, but it can also happen with the use of medications, such as those used in the treatment of cancer. The damage can occur to small secondary nerves, or to large primary  ones, such as those in the spinal cord and brain. This damage may be temporary or permanent and it may lead to impairments that can range from temporary numbness to permanent paralysis and/or brain death. Allergic Reactions: Any time a substance or material comes in contact with our body, there is the possibility of an allergic reaction. These can range from a mild skin rash (contact dermatitis) to a severe systemic reaction (anaphylactic reaction), which can result in death. Death: In general, any medical intervention can result in death, most of the time due to an unforeseen complication. ____________________________________________________________________________________________ ____________________________________________________________________________________________  Blood Thinners  IMPORTANT NOTICE:  If you take any of these, make sure to notify the nursing staff.  Failure to do so may result in injury.  Recommended time intervals to stop and restart blood-thinners, before & after invasive procedures  Generic Name Brand Name Pre-procedure. Stop this long before procedure. Post-procedure. Minimum waiting period before restarting.  Abciximab Reopro 15 days 2 hrs  Alteplase Activase 10 days 10 days  Anagrelide Agrylin    Apixaban Eliquis 3 days 6 hrs  Cilostazol Pletal 3 days 5 hrs  Clopidogrel Plavix 7-10 days 2 hrs  Dabigatran Pradaxa 5 days 6 hrs  Dalteparin Fragmin 24 hours 4 hrs  Dipyridamole Aggrenox 11days 2 hrs  Edoxaban Lixiana; Savaysa 3 days 2 hrs  Enoxaparin  Lovenox 24 hours 4 hrs  Eptifibatide Integrillin 8 hours 2 hrs  Fondaparinux  Arixtra 72 hours 12 hrs  Hydroxychloroquine Plaquenil 11 days   Prasugrel Effient 7-10 days 6 hrs  Reteplase Retavase 10 days 10 days  Rivaroxaban Xarelto 3 days 6 hrs  Ticagrelor Brilinta 5-7 days 6 hrs  Ticlopidine Ticlid 10-14 days 2 hrs  Tinzaparin Innohep 24 hours 4 hrs  Tirofiban Aggrastat 8 hours 2 hrs  Warfarin Coumadin 5  days 2 hrs   Other medications with blood-thinning effects  Product indications Generic (Brand) names Note  Cholesterol Lipitor Stop 4 days before procedure  Blood thinner (injectable) Heparin (LMW or LMWH Heparin) Stop 24 hours before procedure  Cancer Ibrutinib (Imbruvica) Stop 7 days before procedure  Malaria/Rheumatoid Hydroxychloroquine (Plaquenil) Stop 11 days before procedure  Thrombolytics  10 days before or after procedures   Over-the-counter (OTC) Products with blood-thinning effects  Product Common names Stop Time  Aspirin > 325 mg Goody Powders, Excedrin, etc. 11 days  Aspirin ? 81 mg  7 days  Fish oil  4 days  Garlic supplements  7 days  Ginkgo biloba  36 hours  Ginseng  24 hours  NSAIDs Ibuprofen, Naprosyn, etc. 3 days  Vitamin E  4 days   ____________________________________________________________________________________________ ______________________________________________________________________  Preparing for your procedure (without sedation)  Procedure appointments are limited to planned procedures: No Prescription Refills. No disability issues will be discussed. No medication changes will be discussed.  Instructions: Oral Intake: Do not eat or drink anything for at least 6 hours prior to your procedure. (Exception: Blood Pressure Medication. See below.) Transportation: Unless otherwise stated by your physician, you may drive yourself after the procedure. Blood Pressure Medicine: Do not forget to take your blood pressure medicine with a sip of water the morning of the procedure. If your Diastolic (lower reading)is above 100 mmHg, elective  cases will be cancelled/rescheduled. Blood thinners: These will need to be stopped for procedures. Notify our staff if you are taking any blood thinners. Depending on which one you take, there will be specific instructions on how and when to stop it. Diabetics on insulin: Notify the staff so that you can be scheduled 1st case  in the morning. If your diabetes requires high dose insulin, take only  of your normal insulin dose the morning of the procedure and notify the staff that you have done so. Preventing infections: Shower with an antibacterial soap the morning of your procedure.  Build-up your immune system: Take 1000 mg of Vitamin C with every meal (3 times a day) the day prior to your procedure. Antibiotics: Inform the staff if you have a condition or reason that requires you to take antibiotics before dental procedures. Pregnancy: If you are pregnant, call and cancel the procedure. Sickness: If you have a cold, fever, or any active infections, call and cancel the procedure. Arrival: You must be in the facility at least 30 minutes prior to your scheduled procedure. Children: Do not bring any children with you. Dress appropriately: Bring dark clothing that you would not mind if they get stained. Valuables: Do not bring any jewelry or valuables.  Reasons to call and reschedule or cancel your procedure: (Following these recommendations will minimize the risk of a serious complication.) Surgeries: Avoid having procedures within 2 weeks of any surgery. (Avoid for 2 weeks before or after any surgery). Flu Shots: Avoid having procedures within 2 weeks of a flu shots or . (Avoid for 2 weeks before or after immunizations). Barium: Avoid having a procedure within 7-10 days after having had a radiological study involving the use of radiological contrast. (Myelograms, Barium swallow or enema study). Heart attacks: Avoid any elective procedures or surgeries for the initial 6 months after a "Myocardial Infarction" (Heart Attack). Blood thinners: It is imperative that you stop these medications before procedures. Let us know if you if you take any blood thinner.  Infection: Avoid procedures during or within two weeks of an infection (including chest colds or gastrointestinal problems). Symptoms associated with infections include:  Localized redness, fever, chills, night sweats or profuse sweating, burning sensation when voiding, cough, congestion, stuffiness, runny nose, sore throat, diarrhea, nausea, vomiting, cold or Flu symptoms, recent or current infections. It is specially important if the infection is over the area that we intend to treat. Heart and lung problems: Symptoms that may suggest an active cardiopulmonary problem include: cough, chest pain, breathing difficulties or shortness of breath, dizziness, ankle swelling, uncontrolled high or unusually low blood pressure, and/or palpitations. If you are experiencing any of these symptoms, cancel your procedure and contact your primary care physician for an evaluation.  Remember:  Regular Business hours are:  Monday to Thursday 8:00 AM to 4:00 PM  Provider's Schedule: Milinda Pointer, MD:  Procedure days: Tuesday and Thursday 7:30 AM to 4:00 PM  Gillis Santa, MD:  Procedure days: Monday and Wednesday 7:30 AM to 4:00 PM ______________________________________________________________________

## 2020-12-21 ENCOUNTER — Other Ambulatory Visit: Payer: Self-pay

## 2020-12-21 ENCOUNTER — Ambulatory Visit
Admission: RE | Admit: 2020-12-21 | Discharge: 2020-12-21 | Disposition: A | Discharge status: Home / Self Care | Payer: Medicare HMO | Source: Ambulatory Visit | Attending: Pain Medicine | Admitting: Pain Medicine

## 2020-12-21 DIAGNOSIS — G8929 Other chronic pain: Secondary | ICD-10-CM | POA: Diagnosis present

## 2020-12-21 DIAGNOSIS — M25552 Pain in left hip: Secondary | ICD-10-CM | POA: Diagnosis not present

## 2020-12-21 DIAGNOSIS — M7062 Trochanteric bursitis, left hip: Secondary | ICD-10-CM | POA: Insufficient documentation

## 2020-12-21 DIAGNOSIS — R1032 Left lower quadrant pain: Secondary | ICD-10-CM | POA: Insufficient documentation

## 2020-12-21 DIAGNOSIS — M1611 Unilateral primary osteoarthritis, right hip: Secondary | ICD-10-CM | POA: Diagnosis not present

## 2020-12-21 DIAGNOSIS — M160 Bilateral primary osteoarthritis of hip: Secondary | ICD-10-CM | POA: Diagnosis not present

## 2020-12-21 DIAGNOSIS — Z6841 Body Mass Index (BMI) 40.0 and over, adult: Secondary | ICD-10-CM | POA: Diagnosis not present

## 2020-12-21 DIAGNOSIS — D259 Leiomyoma of uterus, unspecified: Secondary | ICD-10-CM | POA: Diagnosis not present

## 2020-12-21 DIAGNOSIS — M7061 Trochanteric bursitis, right hip: Secondary | ICD-10-CM | POA: Insufficient documentation

## 2020-12-21 DIAGNOSIS — M25551 Pain in right hip: Secondary | ICD-10-CM | POA: Diagnosis not present

## 2020-12-21 DIAGNOSIS — R1031 Right lower quadrant pain: Secondary | ICD-10-CM | POA: Insufficient documentation

## 2020-12-21 DIAGNOSIS — S76311A Strain of muscle, fascia and tendon of the posterior muscle group at thigh level, right thigh, initial encounter: Secondary | ICD-10-CM | POA: Diagnosis not present

## 2020-12-21 DIAGNOSIS — M24151 Other articular cartilage disorders, right hip: Secondary | ICD-10-CM | POA: Diagnosis not present

## 2020-12-26 ENCOUNTER — Ambulatory Visit (INDEPENDENT_AMBULATORY_CARE_PROVIDER_SITE_OTHER): Payer: Medicare HMO | Admitting: Bariatrics

## 2020-12-26 ENCOUNTER — Encounter (INDEPENDENT_AMBULATORY_CARE_PROVIDER_SITE_OTHER): Payer: Self-pay | Admitting: Bariatrics

## 2020-12-26 ENCOUNTER — Other Ambulatory Visit: Payer: Self-pay

## 2020-12-26 VITALS — BP 133/86 | HR 61 | Temp 97.8°F | Ht 63.0 in | Wt 257.0 lb

## 2020-12-26 DIAGNOSIS — Z0289 Encounter for other administrative examinations: Secondary | ICD-10-CM

## 2020-12-26 DIAGNOSIS — Z1331 Encounter for screening for depression: Secondary | ICD-10-CM

## 2020-12-26 DIAGNOSIS — Z6841 Body Mass Index (BMI) 40.0 and over, adult: Secondary | ICD-10-CM | POA: Diagnosis not present

## 2020-12-26 DIAGNOSIS — R0602 Shortness of breath: Secondary | ICD-10-CM | POA: Diagnosis not present

## 2020-12-26 DIAGNOSIS — R739 Hyperglycemia, unspecified: Secondary | ICD-10-CM | POA: Diagnosis not present

## 2020-12-26 DIAGNOSIS — I10 Essential (primary) hypertension: Secondary | ICD-10-CM

## 2020-12-26 DIAGNOSIS — E782 Mixed hyperlipidemia: Secondary | ICD-10-CM

## 2020-12-26 DIAGNOSIS — R5383 Other fatigue: Secondary | ICD-10-CM | POA: Diagnosis not present

## 2020-12-26 DIAGNOSIS — G4733 Obstructive sleep apnea (adult) (pediatric): Secondary | ICD-10-CM

## 2020-12-26 DIAGNOSIS — R7303 Prediabetes: Secondary | ICD-10-CM | POA: Diagnosis not present

## 2020-12-26 DIAGNOSIS — K219 Gastro-esophageal reflux disease without esophagitis: Secondary | ICD-10-CM | POA: Diagnosis not present

## 2020-12-26 DIAGNOSIS — E559 Vitamin D deficiency, unspecified: Secondary | ICD-10-CM | POA: Diagnosis not present

## 2020-12-26 DIAGNOSIS — H353221 Exudative age-related macular degeneration, left eye, with active choroidal neovascularization: Secondary | ICD-10-CM | POA: Diagnosis not present

## 2020-12-26 DIAGNOSIS — Z9989 Dependence on other enabling machines and devices: Secondary | ICD-10-CM

## 2020-12-27 ENCOUNTER — Encounter (INDEPENDENT_AMBULATORY_CARE_PROVIDER_SITE_OTHER): Payer: Self-pay | Admitting: Bariatrics

## 2020-12-27 LAB — HEMOGLOBIN A1C
Est. average glucose Bld gHb Est-mCnc: 120 mg/dL
Hgb A1c MFr Bld: 5.8 % — ABNORMAL HIGH (ref 4.8–5.6)

## 2020-12-27 LAB — T4, FREE: Free T4: 0.92 ng/dL (ref 0.82–1.77)

## 2020-12-27 LAB — LIPID PANEL WITH LDL/HDL RATIO
Cholesterol, Total: 240 mg/dL — ABNORMAL HIGH (ref 100–199)
HDL: 59 mg/dL (ref >39)
LDL Chol Calc (NIH): 145 mg/dL — ABNORMAL HIGH (ref 0–99)
LDL/HDL Ratio: 2.5 ratio (ref 0.0–3.2)
Triglycerides: 202 mg/dL — ABNORMAL HIGH (ref 0–149)
VLDL Cholesterol Cal: 36 mg/dL (ref 5–40)

## 2020-12-27 LAB — COMPREHENSIVE METABOLIC PANEL
ALT: 13 IU/L (ref 0–32)
AST: 18 IU/L (ref 0–40)
Albumin/Globulin Ratio: 2.2 (ref 1.2–2.2)
Albumin: 4 g/dL (ref 3.7–4.7)
Alkaline Phosphatase: 96 IU/L (ref 44–121)
BUN/Creatinine Ratio: 17 (ref 12–28)
BUN: 15 mg/dL (ref 8–27)
Bilirubin Total: 0.3 mg/dL (ref 0.0–1.2)
CO2: 26 mmol/L (ref 20–29)
Calcium: 9.6 mg/dL (ref 8.7–10.3)
Chloride: 100 mmol/L (ref 96–106)
Creatinine, Ser: 0.86 mg/dL (ref 0.57–1.00)
Globulin, Total: 1.8 g/dL (ref 1.5–4.5)
Glucose: 98 mg/dL (ref 65–99)
Potassium: 4 mmol/L (ref 3.5–5.2)
Sodium: 142 mmol/L (ref 134–144)
Total Protein: 5.8 g/dL — ABNORMAL LOW (ref 6.0–8.5)
eGFR: 69 mL/min/{1.73_m2} (ref >59)

## 2020-12-27 LAB — T3: T3, Total: 97 ng/dL (ref 71–180)

## 2020-12-27 LAB — VITAMIN D 25 HYDROXY (VIT D DEFICIENCY, FRACTURES): Vit D, 25-Hydroxy: 28.9 ng/mL — ABNORMAL LOW (ref 30.0–100.0)

## 2020-12-27 LAB — INSULIN, RANDOM: INSULIN: 14.4 u[IU]/mL (ref 2.6–24.9)

## 2020-12-27 LAB — TSH: TSH: 3.59 u[IU]/mL (ref 0.450–4.500)

## 2020-12-27 NOTE — Progress Notes (Signed)
Dear Dr. Dossie Arbour,   Thank you for referring PRUDENCIA PETRELLA to our clinic. The following note includes my evaluation and treatment recommendations.  Chief Complaint:   OBESITY QUANETTA SCHLAU (MR# [id number]) is a 80 y.o. female who presents for evaluation and treatment of obesity and related comorbidities. Current BMI is Body mass index is 45.53 kg/m. Kale has been struggling with her weight for many years and has been unsuccessful in either losing weight, maintaining weight loss, or reaching her healthy weight goal.  Chancy is currently in the action stage of change and ready to dedicate time achieving and maintaining a healthier weight. Courtnay is interested in becoming our patient and working on intensive lifestyle modifications including (but not limited to) diet and exercise for weight loss.  Mikeala states that she likes to cook, but notes back pain as an obstacle.  She states that she craves chocolate.  Matika's habits were reviewed today and are as follows: her desired weight loss is ~90 pounds, she started gaining weight after having her 4th child, her heaviest weight ever was 272 pounds, she craves chocolate, she skips lunch frequently, she is frequently drinking liquids with calories, she frequently makes poor food choices, and she frequently eats larger portions than normal.  Depression Screen Emersyn's Food and Mood (modified PHQ-9) score was 1.  Depression screen [facility] 2/9 12/26/2020  Decreased Interest 0  Down, Depressed, Hopeless 0  PHQ - 2 Score 0  Altered sleeping 0  Tired, decreased energy 1  Change in appetite 0  Feeling bad or failure about yourself  0  Trouble concentrating 0  Moving slowly or fidgety/restless 0  Suicidal thoughts 0  PHQ-9 Score 1  Difficult doing work/chores Not difficult at all   Subjective:   1. Other fatigue Yavonda admits to daytime somnolence and reports waking up still tired. Patent has a history of symptoms of daytime fatigue, morning fatigue,  morning headache, and snoring. Alyisa generally gets 5 or 6 hours of sleep per night, and states that she has poor sleep quality. Snoring is present. Apneic episodes are present. Epworth Sleepiness Score is 8.  Occurs with certain activities.  2. SOB (shortness of breath) on exertion Shauni notes increasing shortness of breath with exercising and seems to be worsening over time with weight gain. She notes getting out of breath sooner with activity than she used to. This has gotten worse recently. Izara denies shortness of breath at rest or orthopnea.  Occurs with certain activities.  3. Essential hypertension Review: taking medications as instructed, no medication side effects noted, no chest pain on exertion, no dyspnea on exertion, no swelling of ankles.  Taking Hyzaar.  BP Readings from Last 3 Encounters:  12/26/20 133/86  12/12/20 134/78  11/30/20 125/73   4. OSA on CPAP Struggles with the CPAP.   5. GERD without esophagitis Taking medications.  6. Mixed hyperlipidemia Doneshia has hyperlipidemia and has been trying to improve her cholesterol levels with intensive lifestyle modification including a low saturated fat diet, exercise and weight loss. She denies any chest pain, claudication or myalgias.  Taking Pravachol.  Lab Results  Component Value Date   ALT 13 12/26/2020   AST 18 12/26/2020   ALKPHOS 96 12/26/2020   BILITOT 0.3 12/26/2020   Lab Results  Component Value Date   CHOL 240 (H) 12/26/2020   HDL 59 12/26/2020   LDLCALC 145 (H) 12/26/2020   TRIG 202 (H) 12/26/2020   7. Hyperglycemia, unspecified She is on  no medications for this.  8. Prediabetes Meridian has a diagnosis of prediabetes based on her elevated HgA1c and was informed this puts her at greater risk of developing diabetes. She continues to work on diet and exercise to decrease her risk of diabetes. She denies nausea or hypoglycemia.  She is taking no medication.  Lab Results  Component Value Date   HGBA1C 5.8 (H)  12/26/2020   Lab Results  Component Value Date   INSULIN 14.4 12/26/2020   9. Hypocalcemia Last calcium was 8.7.  She is taking calcium.  10. Vitamin D deficiency She is currently taking OTC vitamin D 5,000 IU each day. She denies nausea, vomiting or muscle weakness.  Lab Results  Component Value Date   VD25OH 28.9 (L) 12/26/2020   11. Depression screen Vyla was screened for depression as part of her new patient workup today.  PHQ-9 is 1.  Assessment/Plan:   1. Other fatigue Nancee does feel that her weight is causing her energy to be lower than it should be. Fatigue may be related to obesity, depression or many other causes. Labs will be ordered, and in the meanwhile, Payten will focus on self care including making healthy food choices, increasing physical activity and focusing on stress reduction.  Gradually increase activities.  Check thyroid panel.  - EKG 12-Lead - Comprehensive metabolic panel - Hemoglobin A1c - Insulin, random - Lipid Panel With LDL/HDL Ratio - T3 - T4, free - TSH - VITAMIN D 25 Hydroxy (Vit-D Deficiency, Fractures)  2. SOB (shortness of breath) on exertion Ayauna does feel that she gets out of breath more easily that she used to when she exercises. Sayuri's shortness of breath appears to be obesity related and exercise induced. She has agreed to work on weight loss and gradually increase exercise to treat her exercise induced shortness of breath. Will continue to monitor closely.  Gradually increase activities.  Check thyroid panel.  - Lipid Panel With LDL/HDL Ratio - T3 - T4, free - TSH  3. Essential hypertension Jennah is working on healthy weight loss and exercise to improve blood pressure control. We will watch for signs of hypotension as she continues her lifestyle modifications.  Will check CMP and lipids today.  - Comprehensive metabolic panel  4. OSA on CPAP We discussed the importance of using her CPAP for sleep apnea.  5. GERD without  esophagitis Intensive lifestyle modifications are the first line treatment for this issue. We discussed several lifestyle modifications today and she will continue to work on diet, exercise and weight loss efforts. Orders and follow up as documented in patient record.  Continue medications.  Counseling If a person has gastroesophageal reflux disease (GERD), food and stomach acid move back up into the esophagus and cause symptoms or problems such as damage to the esophagus. Anti-reflux measures include: raising the head of the bed, avoiding tight clothing or belts, avoiding eating late at night, not lying down shortly after mealtime, and achieving weight loss. Avoid ASA, NSAID's, caffeine, alcohol, and tobacco.  OTC Pepcid and/or Tums are often very helpful for as needed use.  However, for persisting chronic or daily symptoms, stronger medications like Omeprazole may be needed. You may need to avoid foods and drinks such as: Coffee and tea (with or without caffeine). Drinks that contain alcohol. Energy drinks and sports drinks. Bubbly (carbonated) drinks or sodas. Chocolate and cocoa. Peppermint and mint flavorings. Garlic and onions. Horseradish. Spicy and acidic foods. These include peppers, chili powder, curry powder, vinegar, hot  sauces, and BBQ sauce. Citrus fruit juices and citrus fruits, such as oranges, lemons, and limes. Tomato-based foods. These include red sauce, chili, salsa, and pizza with red sauce. Fried and fatty foods. These include donuts, french fries, potato chips, and high-fat dressings. High-fat meats. These include hot dogs, rib eye steak, sausage, ham, and bacon.  6. Mixed hyperlipidemia Cardiovascular risk and specific lipid/LDL goals reviewed.  We discussed several lifestyle modifications today and Tashima will continue to work on diet, exercise and weight loss efforts. Orders and follow up as documented in patient record.  Check lipid panel.  Continue  medication.  Counseling Intensive lifestyle modifications are the first line treatment for this issue. Dietary changes: Increase soluble fiber. Decrease simple carbohydrates. Exercise changes: Moderate to vigorous-intensity aerobic activity 150 minutes per week if tolerated. Lipid-lowering medications: see documented in medical record.  - Comprehensive metabolic panel  7. Hyperglycemia, unspecified Will check A1c and insulin level today.  - Hemoglobin A1c  8. Prediabetes Io will continue to work on weight loss, exercise, and decreasing simple carbohydrates to help decrease the risk of diabetes.  Check A1c and insulin level today.  - Hemoglobin A1c - Insulin, random  9. Hypocalcemia Continue calcium supplement.  10. Vitamin D deficiency Low Vitamin D level contributes to fatigue and are associated with obesity, breast, and colon cancer. She agrees to continue to take OTC vitamin D 5,000 IU daily.  We will check vitamin D level today.  - VITAMIN D 25 Hydroxy (Vit-D Deficiency, Fractures)  11. Depression screen Depression screen is negative.  12. 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 I recommend Stevanie begin the structured treatment plan as follows:  She has agreed to the Category 1 Plan.  She will work on meal planning and intentional eating.  Reviewed labs from 09/15/2020, including BMP, LDH, CBC, and glucose.  Exercise goals: No exercise has been prescribed at this time.   Behavioral modification strategies: increasing lean protein intake, decreasing simple carbohydrates, increasing vegetables, increasing water intake, decreasing eating out, no skipping meals, meal planning and cooking strategies, keeping healthy foods in the home, and planning for success.  She was informed of the  importance of frequent follow-up visits to maximize her success with intensive lifestyle modifications for her multiple health conditions. She was informed we would discuss her lab results at her next visit unless there is a critical issue that needs to be addressed sooner. Shanelle agreed to keep her next visit at the agreed upon time to discuss these results.  Objective:   Blood pressure 133/86, pulse 61, temperature 97.8 F (36.6 C), height '5\' 3"'$  (1.6 m), weight 257 lb (116.6 kg), SpO2 95 %. Body mass index is 45.53 kg/m.  EKG: Normal sinus rhythm, rate 58 bpm.  Sinus bradycardia.  Possible anterior infarct - age undetermined.  Indirect Calorimeter completed today shows a VO2 of 169 and a REE of 1166.  Her calculated basal metabolic rate is 123456 thus her basal metabolic rate is worse than expected.  General: Cooperative, alert, well developed, in no acute distress. HEENT: Conjunctivae and lids unremarkable. Cardiovascular: Regular rhythm.  Lungs: Normal work of breathing. Neurologic: No focal deficits. Uses a walker for ambulation.  Lab Results  Component Value Date   CREATININE 0.86 12/26/2020   BUN 15 12/26/2020  NA 142 12/26/2020   K 4.0 12/26/2020   CL 100 12/26/2020   CO2 26 12/26/2020   Lab Results  Component Value Date   ALT 13 12/26/2020   AST 18 12/26/2020   ALKPHOS 96 12/26/2020   BILITOT 0.3 12/26/2020   Lab Results  Component Value Date   HGBA1C 5.8 (H) 12/26/2020   Lab Results  Component Value Date   INSULIN 14.4 12/26/2020   Lab Results  Component Value Date   TSH 3.590 12/26/2020   Lab Results  Component Value Date   CHOL 240 (H) 12/26/2020   HDL 59 12/26/2020   LDLCALC 145 (H) 12/26/2020   TRIG 202 (H) 12/26/2020   Lab Results  Component Value Date   WBC 12.7 (H) 09/15/2020   HGB 13.2 09/15/2020   HCT 40.3 09/15/2020   MCV 97.6 09/15/2020   PLT 248 09/15/2020   Lab Results  Component Value Date   FERRITIN 86 12/18/2018   Obesity  Behavioral Intervention:   Approximately 15 minutes were spent on the discussion below.  ASK: We discussed the diagnosis of obesity with Audris today and Daiana agreed to give [name] permission to discuss obesity behavioral modification therapy today.  ASSESS: Reesheda has the diagnosis of obesity and her BMI today is 45.6. Chanya is in the action stage of change.   ADVISE: Motunrayo was educated on the multiple health risks of obesity as well as the benefit of weight loss to improve her health. She was advised of the need for long term treatment and the importance of lifestyle modifications to improve her current health and to decrease her risk of future health problems.  AGREE: Multiple dietary modification options and treatment options were discussed and Skarleth agreed to follow the recommendations documented in the above note.  ARRANGE: Evonne was educated on the importance of frequent visits to treat obesity as outlined per CMS and USPSTF guidelines and agreed to schedule her next follow up appointment today.  Attestation Statements:   Reviewed by clinician on day of visit: allergies, medications, problem list, medical history, surgical history, family history, social history, and previous encounter notes.  I, Water quality scientist, CMA, am acting as Location manager for [employer], DO  I have reviewed the above documentation for accuracy and completeness, and I agree with the above. Jearld Lesch, DO

## 2021-01-01 ENCOUNTER — Telehealth: Payer: Self-pay | Admitting: *Deleted

## 2021-01-01 DIAGNOSIS — G4733 Obstructive sleep apnea (adult) (pediatric): Secondary | ICD-10-CM | POA: Diagnosis not present

## 2021-01-01 NOTE — Telephone Encounter (Signed)
Patient called requesting how many days she should stop Eliquis prior to procedure.  I have looked to see if she has clearance to stop and she said that Dr Ouida Sills approved that a long time ago.  Patient instructed to stop Eliquis x 3 doses prior to procedure on Thursday. Patient verbalizes u/o information.

## 2021-01-04 ENCOUNTER — Ambulatory Visit
Admission: RE | Admit: 2021-01-04 | Discharge: 2021-01-04 | Disposition: A | Discharge status: Home / Self Care | Payer: Medicare HMO | Source: Ambulatory Visit | Attending: Pain Medicine | Admitting: Pain Medicine

## 2021-01-04 ENCOUNTER — Encounter: Payer: Self-pay | Admitting: Pain Medicine

## 2021-01-04 ENCOUNTER — Other Ambulatory Visit: Payer: Self-pay

## 2021-01-04 ENCOUNTER — Other Ambulatory Visit: Payer: Self-pay | Admitting: Internal Medicine

## 2021-01-04 ENCOUNTER — Ambulatory Visit (HOSPITAL_BASED_OUTPATIENT_CLINIC_OR_DEPARTMENT_OTHER): Payer: Medicare HMO | Admitting: Pain Medicine

## 2021-01-04 VITALS — BP 120/76 | HR 62 | Temp 97.1°F | Resp 16 | Ht 65.0 in | Wt 255.0 lb

## 2021-01-04 DIAGNOSIS — M5137 Other intervertebral disc degeneration, lumbosacral region: Secondary | ICD-10-CM | POA: Diagnosis not present

## 2021-01-04 DIAGNOSIS — M4316 Spondylolisthesis, lumbar region: Secondary | ICD-10-CM | POA: Diagnosis not present

## 2021-01-04 DIAGNOSIS — M47817 Spondylosis without myelopathy or radiculopathy, lumbosacral region: Secondary | ICD-10-CM | POA: Diagnosis not present

## 2021-01-04 DIAGNOSIS — G8929 Other chronic pain: Secondary | ICD-10-CM | POA: Diagnosis not present

## 2021-01-04 DIAGNOSIS — M2540 Effusion, unspecified joint: Secondary | ICD-10-CM

## 2021-01-04 DIAGNOSIS — M545 Low back pain, unspecified: Secondary | ICD-10-CM | POA: Insufficient documentation

## 2021-01-04 DIAGNOSIS — M47819 Spondylosis without myelopathy or radiculopathy, site unspecified: Secondary | ICD-10-CM

## 2021-01-04 DIAGNOSIS — Z7901 Long term (current) use of anticoagulants: Secondary | ICD-10-CM | POA: Insufficient documentation

## 2021-01-04 DIAGNOSIS — M47816 Spondylosis without myelopathy or radiculopathy, lumbar region: Secondary | ICD-10-CM | POA: Insufficient documentation

## 2021-01-04 DIAGNOSIS — Z1231 Encounter for screening mammogram for malignant neoplasm of breast: Secondary | ICD-10-CM

## 2021-01-04 DIAGNOSIS — M4317 Spondylolisthesis, lumbosacral region: Secondary | ICD-10-CM | POA: Diagnosis not present

## 2021-01-04 MED ORDER — TRIAMCINOLONE ACETONIDE 40 MG/ML IJ SUSP
80.0000 mg | Freq: Once | INTRAMUSCULAR | Status: AC
  Administered 2021-01-04: 80 mg
  Filled 2021-01-04: qty 2

## 2021-01-04 MED ORDER — PENTAFLUOROPROP-TETRAFLUOROETH EX AERO
INHALATION_SPRAY | Freq: Once | CUTANEOUS | Status: AC
  Administered 2021-01-04: 30 via TOPICAL
  Filled 2021-01-04: qty 116

## 2021-01-04 MED ORDER — ROPIVACAINE HCL 2 MG/ML IJ SOLN
INTRAMUSCULAR | Status: AC
  Filled 2021-01-04: qty 20

## 2021-01-04 MED ORDER — LIDOCAINE HCL 2 % IJ SOLN
20.0000 mL | Freq: Once | INTRAMUSCULAR | Status: AC
  Administered 2021-01-04: 400 mg

## 2021-01-04 MED ORDER — ROPIVACAINE HCL 2 MG/ML IJ SOLN
18.0000 mL | Freq: Once | INTRAMUSCULAR | Status: AC
  Administered 2021-01-04: 18 mL via PERINEURAL

## 2021-01-04 MED ORDER — LIDOCAINE HCL (PF) 2 % IJ SOLN
INTRAMUSCULAR | Status: AC
  Filled 2021-01-04: qty 20

## 2021-01-04 NOTE — Progress Notes (Signed)
Safety precautions to be maintained throughout the outpatient stay will include: orient to surroundings, keep bed in low position, maintain call bell within reach at all times, provide assistance with transfer out of bed and ambulation.  

## 2021-01-04 NOTE — Progress Notes (Signed)
PROVIDER NOTE: Information contained herein reflects review and annotations entered in association with encounter. Interpretation of such information and data should be left to medically-trained personnel. Information provided to patient can be located elsewhere in the medical record under "Patient Instructions". Document created using STT-dictation technology, any transcriptional errors that may result from process are unintentional.    Patient: Jamie Phillips  Service Category: Procedure  Provider: Gaspar Cola, MD  DOB: 03/21/1941  DOS: 01/04/2021  Location: [facility]  MRN: [id number]  Setting: Ambulatory - outpatient  Referring Provider: Milinda Pointer, MD  Type: Established Patient  Specialty: Interventional Pain Management  PCP: Kirk Ruths, MD   Primary Reason for Visit: Interventional Pain Management Treatment. CC: Back Pain (Bilateral lumbar facet )   Procedure:          Anesthesia, Analgesia, Anxiolysis:  Type: Lumbar Facet, Medial Branch Block(s) #3  Primary Purpose: Therapeutic Region: Posterolateral Lumbosacral Spine Level: L2, L3, L4, L5, & S1 Medial Branch Level(s). Injecting these levels blocks the L3-4, L4-5, and L5-S1 lumbar facet joints. Laterality: Bilateral  Type: Local Anesthesia Local Anesthetic: Lidocaine 1-2% Sedation: None  Indication(s):  Analgesia Route: Infiltration (Jasper/IM) IV Access: Declined   Position: Prone   Indications: 1. Lumbar facet syndrome (Multilevel) (Bilateral)   2. Lumbar facet hypertrophy (Multilevel) (Bilateral)   3. Spondylosis without myelopathy or radiculopathy, lumbosacral region   4. DDD (degenerative disc disease), lumbosacral   5. 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 Anterolisthesis of lumbosacral spine (L5/S1)   8. 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 Chronic anticoagulation  (Eliquis)    Pain Score: Pre-procedure: 8 /10 Post-procedure: 7 /10    Pre-op H&P Assessment:  Ms. Shima is a 80 y.o. (year old), female patient, seen today for interventional treatment. She  has a past surgical history that includes neck fusion; Wrist surgery (Left, 2000); Cardiac catheterization (N/A, 08/15/2015); IVC FILTER INSERTION (N/A, 06/11/2016); Colonoscopy; Joint replacement (Left, 06/17/2016); Colonoscopy with propofol (N/A, 03/10/2017); IVC FILTER REMOVAL (N/A, 05/06/2017); laparoscopy (N/A, 10/10/2018); laparoscopic appendectomy (N/A, 10/10/2018); Partial colectomy (Right, 10/10/2018); Appendectomy; Esophagogastroduodenoscopy (egd) with propofol (N/A, 02/02/2019); and Breast biopsy (Left, 08/19/04). Ms. Mcgaha has a current medication list which includes the following prescription(s): acetaminophen, calcium carbonate, vitamin d3, cholestyramine, duloxetine, eliquis, ergocalciferol, escitalopram, gabapentin, losartan-hydrochlorothiazide, magnesium gluconate, multiple vitamins-minerals, oxycodone, [START ON 01/16/2021] oxycodone, pantoprazole, pravastatin, propranolol, sucralfate, turmeric, and oxycodone. Her primarily concern today is the Back Pain (Bilateral lumbar facet )  Initial Vital Signs:  Pulse/HCG Rate: 63  Temp: (!) 97.1 F (36.2 C) Resp: 16 BP: 98/73 SpO2: 98 %  BMI: Estimated body mass index is 42.43 kg/m as calculated from the following:   Height as of this encounter: '5\' 5"'$  (1.651 m).   Weight as of this encounter: 255 lb (115.7 kg).  Risk Assessment: Allergies: Reviewed. She has No Known Allergies.  Allergy Precautions: None required Coagulopathies: Reviewed. None identified.  Blood-thinner therapy: None at this time Active Infection(s): Reviewed. None identified. Ms. Ditsworth is afebrile  Site Confirmation: Ms. Otting was asked to confirm the procedure and laterality before marking the site Procedure checklist: Completed Consent: Before the procedure and  under the influence of no sedative(s), amnesic(s), or anxiolytics, the patient was informed of the treatment options, risks and possible complications. To fulfill our ethical and legal obligations, as recommended by the American Medical Association's Code of Ethics, I have  informed the patient of my clinical impression; the nature and purpose of the treatment or procedure; the risks, benefits, and possible complications of the intervention; the alternatives, including doing nothing; the risk(s) and benefit(s) of the alternative treatment(s) or procedure(s); and the risk(s) and benefit(s) of doing nothing. The patient was provided information about the general risks and possible complications associated with the procedure. These may include, but are not limited to: failure to achieve desired goals, infection, bleeding, organ or nerve damage, allergic reactions, paralysis, and death. In addition, the patient was informed of those risks and complications associated to Spine-related procedures, such as failure to decrease pain; infection (i.e.: Meningitis, epidural or intraspinal abscess); bleeding (i.e.: epidural hematoma, subarachnoid hemorrhage, or any other type of intraspinal or peri-dural bleeding); organ or nerve damage (i.e.: Any type of peripheral nerve, nerve root, or spinal cord injury) with subsequent damage to sensory, motor, and/or autonomic systems, resulting in permanent pain, numbness, and/or weakness of one or several areas of the body; allergic reactions; (i.e.: anaphylactic reaction); and/or death. Furthermore, the patient was informed of those risks and complications associated with the medications. These include, but are not limited to: allergic reactions (i.e.: anaphylactic or anaphylactoid reaction(s)); adrenal axis suppression; blood sugar elevation that in diabetics may result in ketoacidosis or comma; water retention that in patients with history of congestive heart failure may result in  shortness of breath, pulmonary edema, and decompensation with resultant heart failure; weight gain; swelling or edema; medication-induced neural toxicity; particulate matter embolism and blood vessel occlusion with resultant organ, and/or nervous system infarction; and/or aseptic necrosis of one or more joints. Finally, the patient was informed that Medicine is not an exact science; therefore, there is also the possibility of unforeseen or unpredictable risks and/or possible complications that may result in a catastrophic outcome. The patient indicated having understood very clearly. We have given the patient no guarantees and we have made no promises. Enough time was given to the patient to ask questions, all of which were answered to the patient's satisfaction. Ms. Nealey has indicated that she wanted to continue with the procedure. Attestation: I, the ordering provider, attest that I have discussed with the patient the benefits, risks, side-effects, alternatives, likelihood of achieving goals, and potential problems during recovery for the procedure that I have provided informed consent. Date  Time: 01/04/2021 10:48 AM  Pre-Procedure Preparation:  Monitoring: As per clinic protocol. Respiration, ETCO2, SpO2, BP, heart rate and rhythm monitor placed and checked for adequate function Safety Precautions: Patient was assessed for positional comfort and pressure points before starting the procedure. Time-out: I initiated and conducted the "Time-out" before starting the procedure, as per protocol. The patient was asked to participate by confirming the accuracy of the "Time Out" information. Verification of the correct person, site, and procedure were performed and confirmed by me, the nursing staff, and the patient. "Time-out" conducted as per Joint Commission's Universal Protocol (UP.01.01.01). Time: 1155  Description of Procedure:          Laterality: Bilateral. The procedure was performed in identical  fashion on both sides. Levels:  L2, L3, L4, L5, & S1 Medial Branch Level(s) Area Prepped: Posterior Lumbosacral Region DuraPrep (Iodine Povacrylex [0.7% available iodine] and Isopropyl Alcohol, 74% w/w) Safety Precautions: Aspiration looking for blood return was conducted prior to all injections. At no point did we inject any substances, as a needle was being advanced. Before injecting, the patient was told to immediately notify me if she was experiencing any new onset of "  ringing in the ears, or metallic taste in the mouth". No attempts were made at seeking any paresthesias. Safe injection practices and needle disposal techniques used. Medications properly checked for expiration dates. SDV (single dose vial) medications used. After the completion of the procedure, all disposable equipment used was discarded in the proper designated medical waste containers. Local Anesthesia: Protocol guidelines were followed. The patient was positioned over the fluoroscopy table. The area was prepped in the usual manner. The time-out was completed. The target area was identified using fluoroscopy. A 12-in long, straight, sterile hemostat was used with fluoroscopic guidance to locate the targets for each level blocked. Once located, the skin was marked with an approved surgical skin marker. Once all sites were marked, the skin (epidermis, dermis, and hypodermis), as well as deeper tissues (fat, connective tissue and muscle) were infiltrated with a small amount of a short-acting local anesthetic, loaded on a 10cc syringe with a 25G, 1.5-in  Needle. An appropriate amount of time was allowed for local anesthetics to take effect before proceeding to the next step. Local Anesthetic: Lidocaine 2.0% The unused portion of the local anesthetic was discarded in the proper designated containers. Technical explanation of process:  L2 Medial Branch Nerve Block (MBB): The target area for the L2 medial branch is at the junction of the  postero-lateral aspect of the superior articular process and the superior, posterior, and medial edge of the transverse process of L3. Under fluoroscopic guidance, a Quincke needle was inserted until contact was made with os over the superior postero-lateral aspect of the pedicular shadow (target area). After negative aspiration for blood, 0.5 mL of the nerve block solution was injected without difficulty or complication. The needle was removed intact. L3 Medial Branch Nerve Block (MBB): The target area for the L3 medial branch is at the junction of the postero-lateral aspect of the superior articular process and the superior, posterior, and medial edge of the transverse process of L4. Under fluoroscopic guidance, a Quincke needle was inserted until contact was made with os over the superior postero-lateral aspect of the pedicular shadow (target area). After negative aspiration for blood, 0.5 mL of the nerve block solution was injected without difficulty or complication. The needle was removed intact. L4 Medial Branch Nerve Block (MBB): The target area for the L4 medial branch is at the junction of the postero-lateral aspect of the superior articular process and the superior, posterior, and medial edge of the transverse process of L5. Under fluoroscopic guidance, a Quincke needle was inserted until contact was made with os over the superior postero-lateral aspect of the pedicular shadow (target area). After negative aspiration for blood, 0.5 mL of the nerve block solution was injected without difficulty or complication. The needle was removed intact. L5 Medial Branch Nerve Block (MBB): The target area for the L5 medial branch is at the junction of the postero-lateral aspect of the superior articular process and the superior, posterior, and medial edge of the sacral ala. Under fluoroscopic guidance, a Quincke needle was inserted until contact was made with os over the superior postero-lateral aspect of the  pedicular shadow (target area). After negative aspiration for blood, 0.5 mL of the nerve block solution was injected without difficulty or complication. The needle was removed intact. S1 Medial Branch Nerve Block (MBB): The target area for the S1 medial branch is at the posterior and inferior 6 o'clock position of the L5-S1 facet joint. Under fluoroscopic guidance, the Quincke needle inserted for the L5 MBB was redirected until  contact was made with os over the inferior and postero aspect of the sacrum, at the 6 o' clock position under the L5-S1 facet joint (Target area). After negative aspiration for blood, 0.5 mL of the nerve block solution was injected without difficulty or complication. The needle was removed intact.  Nerve block solution: 0.2% PF-Ropivacaine + Triamcinolone (40 mg/mL) diluted to a final concentration of 4 mg of Triamcinolone/mL of Ropivacaine The unused portion of the solution was discarded in the proper designated containers. Procedural Needles: 22-gauge, 7-inch, Quincke needles used for all levels.  Once the entire procedure was completed, the treated area was cleaned, making sure to leave some of the prepping solution back to take advantage of its long term bactericidal properties.      Illustration of the posterior view of the lumbar spine and the posterior neural structures. Laminae of L2 through S1 are labeled. DPRL5, dorsal primary ramus of L5; DPRS1, dorsal primary ramus of S1; DPR3, dorsal primary ramus of L3; FJ, facet (zygapophyseal) joint L3-L4; I, inferior articular process of L4; LB1, lateral branch of dorsal primary ramus of L1; IAB, inferior articular branches from L3 medial branch (supplies L4-L5 facet joint); IBP, intermediate branch plexus; MB3, medial branch of dorsal primary ramus of L3; NR3, third lumbar nerve root; S, superior articular process of L5; SAB, superior articular branches from L4 (supplies L4-5 facet joint also); TP3, transverse process of  L3.  Vitals:   01/04/21 1046 01/04/21 1155 01/04/21 1205 01/04/21 1210  BP: 98/73 117/72 118/79 120/76  Pulse: 63 61 (!) 58 62  Resp: '16 20 16 16  '$ Temp: (!) 97.1 F (36.2 C)     TempSrc: Temporal     SpO2: 98% 97% 98% 99%  Weight: 255 lb (115.7 kg)     Height: '5\' 5"'$  (1.651 m)        Start Time: 1155 hrs. End Time: 1207 hrs.  Imaging Guidance (Spinal):          Type of Imaging Technique: Fluoroscopy Guidance (Spinal) Indication(s): Assistance in needle guidance and placement for procedures requiring needle placement in or near specific anatomical locations not easily accessible without such assistance. Exposure Time: Please see nurses notes. Contrast: None used. Fluoroscopic Guidance: I was personally present during the use of fluoroscopy. "Tunnel Vision Technique" used to obtain the best possible view of the target area. Parallax error corrected before commencing the procedure. "Direction-depth-direction" technique used to introduce the needle under continuous pulsed fluoroscopy. Once target was reached, antero-posterior, oblique, and lateral fluoroscopic projection used confirm needle placement in all planes. Images permanently stored in EMR. Interpretation: No contrast injected. I personally interpreted the imaging intraoperatively. Adequate needle placement confirmed in multiple planes. Permanent images saved into the patient's record.  Antibiotic Prophylaxis:   Anti-infectives (From admission, onward)    None      Indication(s): None identified  Post-operative Assessment:  Post-procedure Vital Signs:  Pulse/HCG Rate: 62 (nsr)  Temp: (!) 97.1 F (36.2 C) Resp: 16 BP: 120/76 SpO2: 99 %  EBL: None  Complications: No immediate post-treatment complications observed by team, or reported by patient.  Note: The patient tolerated the entire procedure well. A repeat set of vitals were taken after the procedure and the patient was kept under observation following  institutional policy, for this type of procedure. Post-procedural neurological assessment was performed, showing return to baseline, prior to discharge. The patient was provided with post-procedure discharge instructions, including a section on how to identify potential problems. Should any problems arise concerning  this procedure, the patient was given instructions to immediately contact us, at any time, without hesitation. In any case, we plan to contact the patient by telephone for a follow-up status report regarding this interventional procedure.  Comments:  No additional relevant information.  Plan of Care  Orders:  Orders Placed This Encounter  Procedures   LUMBAR FACET(MEDIAL BRANCH NERVE BLOCK) MBNB    Scheduling Instructions:     Procedure: Lumbar facet block (AKA.: Lumbosacral medial branch nerve block)     Side: Bilateral     Level: L3-4, L4-5, & L5-S1 Facets (L2, L3, L4, L5, & S1 Medial Branch Nerves)     Sedation: Patient's choice.     Timeframe: Today    Order Specific Question:   Where will this procedure be performed?    Answer:   [facility]   DG [facility] C-ARM 1-60 MIN NO REPORT    Intraoperative interpretation by procedural physician at [facility].    Standing Status:   Standing    Number of Occurrences:   1    Order Specific Question:   Reason for exam:    Answer:   Assistance in needle guidance and placement for procedures requiring needle placement in or near specific anatomical locations not easily accessible without such assistance.   Informed Consent Details: Physician/Practitioner Attestation; Transcribe to consent form and obtain patient signature    Nursing Order: Transcribe to consent form and obtain patient signature. Note: Always confirm laterality of pain with Ms. Hardin Negus, before procedure.    Order Specific Question:   Physician/Practitioner attestation of informed consent for procedure/surgical case    Answer:   I, the  physician/practitioner, attest that I have discussed with the patient the benefits, risks, side effects, alternatives, likelihood of achieving goals and potential problems during recovery for the procedure that I have provided informed consent.    Order Specific Question:   Procedure    Answer:   Lumbar Facet Block  under fluoroscopic guidance    Order Specific Question:   Physician/Practitioner performing the procedure    Answer:   Darcell Yacoub A. 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   Care order/instruction: Please confirm that the patient has stopped the Eliquis (Apixaban) x 3 days prior to procedure or surgery.    Please confirm that the patient has stopped the Eliquis (Apixaban) x 3 days prior to procedure or surgery.    Standing Status:   Standing    Number of Occurrences:   1   Provide equipment / supplies at bedside    "Block Tray" (Disposable  single use) Needle type: SpinalSpinal Amount/quantity: 4 Size: Medium (5-inch) Gauge: 22G    Standing Status:   Standing    Number of Occurrences:   1    Order Specific Question:   Specify    Answer:   Block Tray   Bleeding precautions    Standing Status:   Standing    Number of Occurrences:   1    Chronic Opioid Analgesic:  Tramadol 50 mg, 1 tab p.o. 4 times daily (200 mg/day of tramadol) MME/day: 20 mg/day   Medications ordered for procedure: Meds ordered this encounter  Medications   lidocaine (XYLOCAINE) 2 % (with pres) injection 400 mg   pentafluoroprop-tetrafluoroeth (GEBAUERS) aerosol  ropivacaine (PF) 2 mg/mL (0.2%) (NAROPIN) injection 18 mL   triamcinolone acetonide (KENALOG-40) injection 80 mg    Medications administered: We administered lidocaine, pentafluoroprop-tetrafluoroeth, ropivacaine (PF) 2 mg/mL (0.2%),  and triamcinolone acetonide.  See the medical record for exact dosing, route, and time of administration.  Follow-up plan:   Return in about 2 weeks (around 01/18/2021) for Proc-day (T,Th), (VV), (PPE).       Interventional Therapies  Risk  Complexity Considerations:   Estimated body mass index is 43.6 kg/m as calculated from the following:   Height as of this encounter: '5\' 5"'$  (1.651 m).   Weight as of this encounter: 262 lb (118.8 kg). NOTE: Eliquis Anticoagulation: (Stop: 3 days  Restart: 6 hrs)   Planned  Pending:      Under consideration:   Diagnostic bilateral iliopsoas bursa injection #1  Diagnostic midline T12-L1 LESI    Completed:   Diagnostic bilateral IA hip joint injection x1 (11/30/2020)  Diagnostic bilateral lumbar facet MBB x2 (10/05/2020) (100/100/50/50)  Diagnostic bilateral L3 TFESI x3 (11/07/2020) (100/100/100/100% relief of the bilateral L1/L2 radiculitis)  Diagnostic right L2-3 LESI x1 (07/11/2020) (100/100/100/100% relief of the bilateral L1/L2 radiculitis)    Therapeutic  Palliative (PRN) options:   Therapeutic right L2-3 LESI #2  Therapeutic bilateral L3 transforaminal ESI #4  Diagnostic bilateral lumbar facet MBB #3     Recent Visits Date Type Provider Dept  01/04/21 Procedure visit Milinda Pointer, MD [facility]  12/12/20 Office Visit Milinda Pointer, MD [facility]  11/30/20 Procedure visit Milinda Pointer, MD [facility]  11/21/20 Procedure visit Milinda Pointer, MD [facility]  11/15/20 Office Visit Milinda Pointer, MD [facility]  11/07/20 Procedure visit Milinda Pointer, MD [facility]  10/18/20 Telemedicine Milinda Pointer, MD [facility]  Showing recent visits within past 90 days and meeting all other requirements Future Appointments Date Type Provider Dept  01/23/21 Appointment Milinda Pointer, MD [facility]  02/07/21 Appointment  Milinda Pointer, MD [facility]  Showing future appointments within next 90 days and meeting all other requirements Disposition: Discharge home  Discharge (Date  Time): 01/04/2021; 1214 hrs.   Primary Care Physician: Kirk Ruths, MD Location: [facility] Note by: Gaspar Cola, MD Date: 01/04/2021; Time: 10:43 AM  Disclaimer:  Medicine is not an Chief Strategy Officer. The only guarantee in medicine is that nothing is guaranteed. It is important to note that the decision to proceed with this intervention was based on the information collected from the patient. The Data and conclusions were drawn from the patient's questionnaire, the interview, and the physical examination. Because the information was provided in large part by the patient, it cannot be guaranteed that it has not been purposely or unconsciously manipulated. Every effort has been made to obtain as much relevant data as possible for this evaluation. It is important to note that the conclusions that lead to this procedure are derived in large part from the available data. Always take into account that the treatment will also be dependent on availability of resources and existing treatment guidelines, considered by other Pain Management Practitioners as being common knowledge and practice, at the time of the intervention. For Medico-Legal purposes, it is also important to point out that variation in procedural techniques and pharmacological choices are the acceptable norm. The indications, contraindications, technique, and results of the above procedure should only be interpreted and judged by a Board-Certified Interventional Pain Specialist with extensive familiarity and  expertise in the same exact procedure and technique.

## 2021-01-04 NOTE — Patient Instructions (Signed)

## 2021-01-05 ENCOUNTER — Telehealth: Payer: Self-pay

## 2021-01-05 NOTE — Telephone Encounter (Signed)
Post procedure phone call.  LM 

## 2021-01-10 ENCOUNTER — Ambulatory Visit (INDEPENDENT_AMBULATORY_CARE_PROVIDER_SITE_OTHER): Payer: Medicare HMO | Admitting: Bariatrics

## 2021-01-10 ENCOUNTER — Other Ambulatory Visit: Payer: Self-pay

## 2021-01-10 ENCOUNTER — Encounter (INDEPENDENT_AMBULATORY_CARE_PROVIDER_SITE_OTHER): Payer: Self-pay | Admitting: Bariatrics

## 2021-01-10 VITALS — BP 130/85 | HR 56 | Temp 97.6°F | Ht 65.0 in | Wt 249.0 lb

## 2021-01-10 DIAGNOSIS — R7303 Prediabetes: Secondary | ICD-10-CM

## 2021-01-10 DIAGNOSIS — E66813 Obesity, class 3: Secondary | ICD-10-CM

## 2021-01-10 DIAGNOSIS — E782 Mixed hyperlipidemia: Secondary | ICD-10-CM

## 2021-01-10 DIAGNOSIS — E559 Vitamin D deficiency, unspecified: Secondary | ICD-10-CM

## 2021-01-10 DIAGNOSIS — Z6841 Body Mass Index (BMI) 40.0 and over, adult: Secondary | ICD-10-CM | POA: Diagnosis not present

## 2021-01-10 NOTE — Progress Notes (Signed)
Chief Complaint:   OBESITY Jamie Phillips is here to discuss her progress with her obesity treatment plan along with follow-up of her obesity related diagnoses. Jamie Phillips is on the Category 1 Plan and states she is following her eating plan approximately 50% of the time. Jamie Phillips states she is doing 0 minutes 0 times per week.  Today's visit was #: 3 Starting weight: 257 lbs Starting date: 12/26/2020 Today's weight: 249 lbs Today's date: 01/10/2021 Total lbs lost to date: 8 lbs Total lbs lost since last in-office visit: 8 lbs  Interim History: Jamie Phillips is down 8 lbs since her first visit. She is doing well with her water.  Subjective:   1. Prediabetes Jamie Phillips is currently not on medications. Her last A1C level was 5.8. Her insulin resistance was 14.4.  2. Vitamin D insufficiency Jamie Phillips is currently taking high dose Vitamin D. Her last Vitamin D level is 28.9.  3. Mixed hyperlipidemia Jamie Phillips is currently taking Pravachol. Her mixed hld is slightly elevated and is not controlled.  Assessment/Plan:   1. Prediabetes Jamie Phillips will continue to work on weight loss, exercise, and decreasing simple carbohydrates to help decrease the risk of diabetes. She will increase healthy fats and protein. A  Handout for Insulin Resistance and Pre-diabetes was given today.  2. Vitamin D insufficiency Low Vitamin D level contributes to fatigue and are associated with obesity, breast, and colon cancer. Jamie Phillips will continue with Vitamin D high dose per PCP and she will follow-up for routine testing of Vitamin D, at least 2-3 times per year to avoid over-replacement.  3. Mixed hyperlipidemia Jamie Phillips will continue Pravachol. We discussed the importance of keeping her cholesterol low. Cardiovascular risk and specific lipid/LDL goals reviewed.  We discussed several lifestyle modifications today and [place] will continue to work on diet, exercise and weight loss efforts. Orders and follow up as documented in patient record.    Counseling Intensive lifestyle modifications are the first line treatment for this issue. Dietary changes: Increase soluble fiber. Decrease simple carbohydrates. Exercise changes: Moderate to vigorous-intensity aerobic activity 150 minutes per week if tolerated. Lipid-lowering medications: see documented in medical record.   4. Obesity, current BMI 44.2 Jamie Phillips is currently in the action stage of change. As such, her goal is to continue with weight loss efforts. She has agreed to the Category 1 Plan.   Jamie Phillips will continue meal planning and intentional eating. We will review labs from 12/26/2020. She will eat protein first. She may have a protein shake for breakfast.  Exercise goals: No exercise has been prescribed at this time.  Behavioral modification strategies: increasing lean protein intake, decreasing simple carbohydrates, increasing vegetables, increasing water intake, decreasing eating out, no skipping meals, meal planning and cooking strategies, keeping healthy foods in the home, and planning for success.  Jamie Phillips has agreed to follow-up with our clinic in 2 weeks with Jamie Marble, NP or Jamie Potash, PA-C. She was informed of the importance of frequent follow-up visits to maximize her success with intensive lifestyle modifications for her multiple health conditions.   Objective:   Blood pressure 130/85, pulse (!) 56, temperature 97.6 F (36.4 C), height 5\' 5"  (1.651 m), weight 249 lb (112.9 kg), SpO2 93 %. Body mass index is 41.44 kg/m.  General: Cooperative, alert, well developed, in no acute distress. HEENT: Conjunctivae and lids unremarkable. Cardiovascular: Regular rhythm.  Lungs: Normal work of breathing. Neurologic: No focal deficits.   Lab Results  Component Value Date   CREATININE 0.86 12/26/2020   BUN  15 12/26/2020   NA 142 12/26/2020   K 4.0 12/26/2020   CL 100 12/26/2020   CO2 26 12/26/2020   Lab Results  Component Value Date   ALT 13 12/26/2020   AST 18  12/26/2020   ALKPHOS 96 12/26/2020   BILITOT 0.3 12/26/2020   Lab Results  Component Value Date   HGBA1C 5.8 (H) 12/26/2020   Lab Results  Component Value Date   INSULIN 14.4 12/26/2020   Lab Results  Component Value Date   TSH 3.590 12/26/2020   Lab Results  Component Value Date   CHOL 240 (H) 12/26/2020   HDL 59 12/26/2020   LDLCALC 145 (H) 12/26/2020   TRIG 202 (H) 12/26/2020   Lab Results  Component Value Date   VD25OH 28.9 (L) 12/26/2020   Lab Results  Component Value Date   WBC 12.7 (H) 09/15/2020   HGB 13.2 09/15/2020   HCT 40.3 09/15/2020   MCV 97.6 09/15/2020   PLT 248 09/15/2020   Lab Results  Component Value Date   FERRITIN 86 12/18/2018    Obesity Behavioral Intervention:   Approximately 15 minutes were spent on the discussion below.  ASK: We discussed the diagnosis of obesity with Jamie Phillips today and Jamie Phillips agreed to give [name] permission to discuss obesity behavioral modification therapy today.  ASSESS: Jamie Phillips has the diagnosis of obesity and her BMI today is 44.2. Jamie Phillips is in the action stage of change.   ADVISE: Jamie Phillips was educated on the multiple health risks of obesity as well as the benefit of weight loss to improve her health. She was advised of the need for long term treatment and the importance of lifestyle modifications to improve her current health and to decrease her risk of future health problems.  AGREE: Multiple dietary modification options and treatment options were discussed and Jamie Phillips agreed to follow the recommendations documented in the above note.  ARRANGE: Jamie Phillips was educated on the importance of frequent visits to treat obesity as outlined per CMS and USPSTF guidelines and agreed to schedule her next follow up appointment today.  Attestation Statements:   Reviewed by clinician on day of visit: allergies, medications, problem list, medical history, surgical history, family history, social history, and previous encounter notes.  I, Jamie Phillips, RMA, am acting as Location manager for [employer], DO.   I have reviewed the above documentation for accuracy and completeness, and I agree with the above. Jamie Lesch, DO

## 2021-01-11 ENCOUNTER — Encounter (INDEPENDENT_AMBULATORY_CARE_PROVIDER_SITE_OTHER): Payer: Self-pay | Admitting: Bariatrics

## 2021-01-15 DIAGNOSIS — G4733 Obstructive sleep apnea (adult) (pediatric): Secondary | ICD-10-CM | POA: Diagnosis not present

## 2021-01-15 DIAGNOSIS — Z23 Encounter for immunization: Secondary | ICD-10-CM | POA: Diagnosis not present

## 2021-01-15 DIAGNOSIS — E7800 Pure hypercholesterolemia, unspecified: Secondary | ICD-10-CM | POA: Diagnosis not present

## 2021-01-15 DIAGNOSIS — R7303 Prediabetes: Secondary | ICD-10-CM | POA: Diagnosis not present

## 2021-01-15 DIAGNOSIS — I2692 Saddle embolus of pulmonary artery without acute cor pulmonale: Secondary | ICD-10-CM | POA: Diagnosis not present

## 2021-01-15 DIAGNOSIS — N1831 Chronic kidney disease, stage 3a: Secondary | ICD-10-CM | POA: Diagnosis not present

## 2021-01-15 DIAGNOSIS — I129 Hypertensive chronic kidney disease with stage 1 through stage 4 chronic kidney disease, or unspecified chronic kidney disease: Secondary | ICD-10-CM | POA: Diagnosis not present

## 2021-01-15 DIAGNOSIS — F325 Major depressive disorder, single episode, in full remission: Secondary | ICD-10-CM | POA: Diagnosis not present

## 2021-01-22 ENCOUNTER — Other Ambulatory Visit: Payer: Self-pay

## 2021-01-22 ENCOUNTER — Ambulatory Visit
Admission: RE | Admit: 2021-01-22 | Discharge: 2021-01-22 | Disposition: A | Discharge status: Home / Self Care | Payer: Medicare HMO | Source: Ambulatory Visit | Attending: Internal Medicine | Admitting: Internal Medicine

## 2021-01-22 DIAGNOSIS — Z1231 Encounter for screening mammogram for malignant neoplasm of breast: Secondary | ICD-10-CM | POA: Diagnosis not present

## 2021-01-22 NOTE — Progress Notes (Signed)
Patient: Jamie Phillips  Service Category: E/M  Provider: Gaspar Cola, MD  DOB: Jun 01, 1940  DOS: 01/23/2021  Location: Office  MRN: [id number]  Setting: Ambulatory outpatient  Referring Provider: Kirk Ruths, MD  Type: Established Patient  Specialty: Interventional Pain Management  PCP: Kirk Ruths, MD  Location: Remote location  Delivery: TeleHealth     Virtual Encounter - Pain Management PROVIDER NOTE: Information contained herein reflects review and annotations entered in association with encounter. Interpretation of such information and data should be left to medically-trained personnel. Information provided to patient can be located elsewhere in the medical record under "Patient Instructions". Document created using STT-dictation technology, any transcriptional errors that may result from process are unintentional.    Contact & Pharmacy Preferred: [facility]: [phone] (home) Mobile: [phone] (mobile) E-mail: No e-mail address on record  [facility] [place], [place] - [address] [place] [address] Phone: [phone] Fax: [phone]  [place], [place] [address] Phone: [phone] Fax: [id number], [place] - [facility] [address] [address] Phone: [phone] Fax: [phone]   Pre-screening  Ms. Petitfrere offered "in-person" vs "virtual" encounter. She indicated preferring virtual for this encounter.   Reason COVID-19*  Social distancing based on CDC and AMA recommendations.   I contacted [name] on 01/23/2021 via telephone.      I clearly identified myself as Gaspar Cola, MD. I verified that I was speaking with the correct person using two identifiers (Name: Jamie Phillips, and date of birth:  1940/05/12).  Consent I sought verbal advanced consent from [name] for virtual visit interactions. I informed Ms. Coppinger of possible security and privacy concerns, risks, and limitations associated with providing "not-in-person" medical evaluation and management services. I also informed Ms. Madore of the availability of "in-person" appointments. Finally, I informed her that there would be a charge for the virtual visit and that she could be  personally, fully or partially, financially responsible for it. Ms. Maqueda expressed understanding and agreed to proceed.   Historic Elements   [name] is a 80 y.o. year old, female patient evaluated today after our last contact on 01/04/2021. 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 She also  has a past surgical history that includes neck fusion; Wrist surgery (Left, 2000); Cardiac catheterization (N/A, 08/15/2015); IVC FILTER INSERTION (N/A, 06/11/2016); Colonoscopy; Joint replacement (Left, 06/17/2016); Colonoscopy with propofol (N/A, 03/10/2017); IVC FILTER REMOVAL (N/A, 05/06/2017); laparoscopy (N/A, 10/10/2018); laparoscopic appendectomy (N/A, 10/10/2018); Partial colectomy (Right, 10/10/2018); Appendectomy; Esophagogastroduodenoscopy (egd) with propofol (N/A, 02/02/2019); and Breast biopsy (Left, 08/19/04). [name] has a current medication list which includes the following prescription(s): acetaminophen,  calcium carbonate, vitamin d3, cholestyramine, duloxetine, eliquis, ergocalciferol, escitalopram, gabapentin, losartan-hydrochlorothiazide, magnesium gluconate, multiple vitamins-minerals, oxycodone, oxycodone, oxycodone, pantoprazole, pravastatin, propranolol, sucralfate, and turmeric. She  reports that she has never smoked. She has never used smokeless tobacco. She reports  that she does not drink alcohol and does not use drugs. [name] has No Known Allergies.   HPI  Today, she is being contacted for a post-procedure assessment.  According to the patient she attained close to 100% relief of the pain for approximately 1 hour, but after that the pain was gone and did not improve until several days to a week later.  Currently she is enjoying a 70% improvement of her low back pain however, she still having pain on both hips and the lateral aspect.  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  In addition she has a small interstitial tear at the hamstring origin, bilaterally.  In addition, she has some diffuse labral degeneration with ossification and mild partial-thickness cartilage loss of the right hip.  Reviewing prior interventions we have that on 11/30/2020 she had a bilateral intra-articular hip joint injection and gluteal femoral bursa injection which provided her with 100% relief of the pain for the duration of the local anesthetic (initial 4 to 6 hours) which then went down to about an 80% improvement that have persisted at the time of the follow-up evaluation on 12/12/2020.  Based on the information from that procedure and today's follow-up on the bilateral lumbar facet blocks, we have that it is fairly clear that she is having most of the pain coming from the hip joints with a small component coming from the facet joints.  Since the patient continues to  have pain in the hip area, she has requested that we repeat the hip joint injections to see if that can provide her with some benefit.  Today she indicates that she is following up with the lifestyle Center to see if she can work on losing some of her weight.  Post-Procedure Evaluation  Procedure (01/04/2021):  Procedure:           Anesthesia, Analgesia, Anxiolysis:  Type: Lumbar Facet, Medial Branch Block(s) #3  Primary Purpose: Therapeutic Region: Posterolateral Lumbosacral Spine Level: L2, L3, L4, L5, & S1 Medial Branch Level(s). Injecting these levels blocks the L3-4, L4-5, and L5-S1 lumbar facet joints. Laterality: Bilateral   Type: Local Anesthesia Local Anesthetic: Lidocaine 1-2% Sedation: None  Indication(s):  Analgesia Route: Infiltration ([place]/IM) IV Access: Declined     Position: Prone    Indications: 1. Lumbar facet syndrome (Multilevel) (Bilateral)   2. Lumbar facet hypertrophy (Multilevel) (Bilateral)   3. Spondylosis without myelopathy or radiculopathy, lumbosacral region   4. DDD (degenerative disc disease), lumbosacral   5. 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 Anterolisthesis of lumbosacral spine (L5/S1)   8. 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 Chronic anticoagulation (Eliquis)     Pain Score: Pre-procedure: 8 /10 Post-procedure: 7 /10   Anxiolysis: Please see nurses note.  Effectiveness during initial hour after procedure (Ultra-Short Term Relief):   100%.  Local anesthetic used: Long-acting (4-6 hours) Effectiveness: Defined as any analgesic benefit obtained secondary to the administration of local anesthetics. This carries significant diagnostic value as to the etiological location, or anatomical origin, of the pain. Duration of benefit is expected to coincide with the duration of the local anesthetic used.  Effectiveness during initial 4-6 hours  after procedure (Short-Term Relief):   50%.  Long-term benefit: Defined as any relief past the pharmacologic duration of the local anesthetics.  Effectiveness past the initial 6 hours after procedure (Long-Term Relief):   0%.  Benefits, current: Defined as benefit present at the time of this evaluation.   Analgesia: Currently she indicates enjoying a 70% improvement of her low back pain, but still having some persistent hip pain. Function: Somewhat improved ROM: Somewhat improved   Pharmacotherapy Assessment   Analgesic: Tramadol 50 mg, 1 tab p.o. 4 times daily (200 mg/day of tramadol) MME/day: 20 mg/day   Monitoring: [place] PMP: PDMP reviewed during this encounter.       Pharmacotherapy: No side-effects or adverse reactions reported. Compliance: No problems identified. Effectiveness: Clinically acceptable. Plan: Refer to "POC". UDS:  Summary  Date Value Ref Range Status  07/27/2020 Note  Final    Comment:    ==================================================================== [employer] Select 13 (MW) ==================================================================== Test                             Result       Flag       Units  Drug Present and Declared for Prescription Verification   Oxycodone                      1206         EXPECTED   ng/mg creat   Oxymorphone                    90           EXPECTED   ng/mg creat   Noroxycodone                   2368         EXPECTED   ng/mg creat    Sources of oxycodone include scheduled prescription medications.    Oxymorphone and noroxycodone are expected metabolites of oxycodone.    Oxymorphone is also available as a scheduled prescription medication.  ==================================================================== Test                      Result    Flag   Units      Ref Range   Creatinine              212              mg/dL      >=20 ==================================================================== Declared Medications:   The flagging and interpretation on this report are based on the  following declared medications.  Unexpected results may arise from  inaccuracies in the declared medications.   **Note: The testing scope of this panel includes these medications:   Oxycodone   **Note: The testing scope of this panel does not include the  following reported medications:   Acetaminophen (Tylenol)  Albuterol (Ventolin HFA)  Apixaban (Eliquis)  Cholestyramine (Questran)  Duloxetine (Cymbalta)  Escitalopram (Lexapro)  Gabapentin (Neurontin)  Hydrochlorothiazide (Hyzaar)  Losartan (Hyzaar)  Pantoprazole (Protonix)  Pravastatin (Pravachol)  Sucralfate (Carafate) ==================================================================== For clinical consultation, please call [phone]. ====================================================================      Laboratory Chemistry Profile   Renal Lab Results  Component Value Date   BUN 15 12/26/2020   CREATININE 0.86 12/26/2020   BCR 17 12/26/2020   GFRAA 55 (L) 09/03/2019   GFRNONAA 59 (L) 09/15/2020    Hepatic Lab Results  Component Value Date   AST 18 12/26/2020   ALT 13 12/26/2020  ALBUMIN 4.0 12/26/2020   ALKPHOS 96 12/26/2020   LIPASE 37 10/09/2018    Electrolytes Lab Results  Component Value Date   NA 142 12/26/2020   K 4.0 12/26/2020   CL 100 12/26/2020   CALCIUM 9.6 12/26/2020   MG 1.9 06/21/2020    Bone Lab Results  Component Value Date   VD25OH 28.9 (L) 12/26/2020   25OHVITD1 14 (L) 06/21/2020   25OHVITD2 <1.0 06/21/2020   25OHVITD3 14 06/21/2020    Inflammation (CRP: Acute Phase) (ESR: Chronic Phase) Lab Results  Component Value Date   CRP 9 06/21/2020   ESRSEDRATE 21 06/21/2020   LATICACIDVEN 0.9 12/13/2018         Note: Above Lab results reviewed.  Imaging  DG PAIN CLINIC C-ARM 1-60 MIN NO REPORT Fluoro was used, but no Radiologist interpretation will be provided.  Please refer to "NOTES" tab for  provider progress note.  Assessment  The primary encounter diagnosis was Chronic hip pain (Bilateral). Diagnoses of Chronic groin pain (Bilateral), Iliopsoas bursitis of hips, Osteoarthritis of hips (Bilateral), Chronic low back pain (Bilateral) w/o sciatica, Lumbar facet syndrome (Multilevel) (Bilateral), and Lumbar facet hypertrophy (Multilevel) (Bilateral) were also pertinent to this visit.  Plan of Care  Problem-specific:  No problem-specific Assessment & Plan notes found for this encounter.  Ms. SHERYLANN VANGORDEN has a current medication list which includes the following long-term medication(s): calcium carbonate, cholestyramine, duloxetine, eliquis, escitalopram, gabapentin, losartan-hydrochlorothiazide, oxycodone, oxycodone, oxycodone, and pantoprazole.  Pharmacotherapy (Medications Ordered): No orders of the defined types were placed in this encounter.  Orders:  No orders of the defined types were placed in this encounter.  Follow-up plan:   No follow-ups on file.     Interventional Therapies  Risk  Complexity Considerations:   Estimated body mass index is 43.6 kg/m as calculated from the following:   Height as of this encounter: _0  (1.651 m).   Weight as of this encounter: 262 lb (118.8 kg). NOTE: Eliquis Anticoagulation: (Stop: 3 days  Restart: 6 hrs)   Planned  Pending:      Under consideration:   Diagnostic bilateral iliopsoas bursa injection #1  Diagnostic midline T12-L1 LESI    Completed:   Diagnostic bilateral IA hip joint injection x1 (11/30/2020)  Diagnostic bilateral lumbar facet MBB x2 (10/05/2020) (100/100/50/50)  Diagnostic bilateral L3 TFESI x3 (11/07/2020) (100/100/100/100% relief of the bilateral L1/L2 radiculitis)  Diagnostic right L2-3 LESI x1 (07/11/2020) (100/100/100/100% relief of the bilateral L1/L2 radiculitis)    Therapeutic  Palliative (PRN) options:   Therapeutic right L2-3 LESI #2  Therapeutic bilateral L3 transforaminal ESI #4   Diagnostic bilateral lumbar facet MBB #3     Recent Visits Date Type Provider Dept  01/04/21 Procedure visit Milinda Pointer, MD [facility]  12/12/20 Office Visit Milinda Pointer, MD [facility]  11/30/20 Procedure visit Milinda Pointer, MD [facility]  11/21/20 Procedure visit Milinda Pointer, MD [facility]  11/15/20 Office Visit Milinda Pointer, MD [facility]  11/07/20 Procedure visit Milinda Pointer, MD [facility]  Showing recent visits within past 90 days and meeting all other requirements Today's Visits Date Type Provider Dept  01/23/21 Office Visit Milinda Pointer, MD [facility]  Showing today's visits and meeting all other requirements Future Appointments Date Type Provider Dept  02/07/21 Appointment Milinda Pointer, MD [facility]  Showing future appointments within next 90 days and meeting all other requirements I discussed the assessment and treatment plan with the patient. The  patient was provided an opportunity to ask questions and all were answered. The patient agreed with the plan and demonstrated an understanding of the instructions.  Patient advised to call back or seek an in-person evaluation if the symptoms or condition worsens.  Duration of encounter: 22 minutes.  Note by: Gaspar Cola, MD Date: 01/23/2021; Time: 6:06 PM

## 2021-01-23 ENCOUNTER — Ambulatory Visit: Payer: Medicare HMO | Attending: Pain Medicine | Admitting: Pain Medicine

## 2021-01-23 DIAGNOSIS — M160 Bilateral primary osteoarthritis of hip: Secondary | ICD-10-CM

## 2021-01-23 DIAGNOSIS — M25552 Pain in left hip: Secondary | ICD-10-CM | POA: Diagnosis not present

## 2021-01-23 DIAGNOSIS — G8929 Other chronic pain: Secondary | ICD-10-CM

## 2021-01-23 DIAGNOSIS — M47816 Spondylosis without myelopathy or radiculopathy, lumbar region: Secondary | ICD-10-CM

## 2021-01-23 DIAGNOSIS — R1031 Right lower quadrant pain: Secondary | ICD-10-CM | POA: Diagnosis not present

## 2021-01-23 DIAGNOSIS — M545 Low back pain, unspecified: Secondary | ICD-10-CM

## 2021-01-23 DIAGNOSIS — M7072 Other bursitis of hip, left hip: Secondary | ICD-10-CM | POA: Diagnosis not present

## 2021-01-23 DIAGNOSIS — M7071 Other bursitis of hip, right hip: Secondary | ICD-10-CM | POA: Diagnosis not present

## 2021-01-23 DIAGNOSIS — M25551 Pain in right hip: Secondary | ICD-10-CM

## 2021-01-23 DIAGNOSIS — R1032 Left lower quadrant pain: Secondary | ICD-10-CM

## 2021-01-25 ENCOUNTER — Other Ambulatory Visit: Payer: Self-pay

## 2021-01-25 ENCOUNTER — Ambulatory Visit (INDEPENDENT_AMBULATORY_CARE_PROVIDER_SITE_OTHER): Payer: Medicare HMO | Admitting: Physician Assistant

## 2021-01-25 ENCOUNTER — Encounter (INDEPENDENT_AMBULATORY_CARE_PROVIDER_SITE_OTHER): Payer: Self-pay | Admitting: Physician Assistant

## 2021-01-25 VITALS — BP 144/79 | HR 60 | Temp 97.5°F | Ht 65.0 in | Wt 250.0 lb

## 2021-01-25 DIAGNOSIS — E559 Vitamin D deficiency, unspecified: Secondary | ICD-10-CM

## 2021-01-25 DIAGNOSIS — M5137 Other intervertebral disc degeneration, lumbosacral region: Secondary | ICD-10-CM | POA: Diagnosis not present

## 2021-01-25 DIAGNOSIS — Z6841 Body Mass Index (BMI) 40.0 and over, adult: Secondary | ICD-10-CM | POA: Diagnosis not present

## 2021-01-25 NOTE — Progress Notes (Signed)
Chief Complaint:   OBESITY Jamie Phillips is here to discuss her progress with her obesity treatment plan along with follow-up of her obesity related diagnoses. Jamie Phillips is on the Category 1 Plan and states she is following her eating plan approximately 90% of the time. Jamie Phillips states she is not currently exercising.  Today's visit was #: 4 Starting weight: 257 lbs Starting date: 12/26/2020 Today's weight: 250 lbs Today's date: 01/25/2021 Total lbs lost to date: 7 Total lbs lost since last in-office visit: 0  Interim History: Jamie Phillips is drinking protein shakes sometimes twice a day. She has been eating Cheerios and shredded wheat. She indulged in a bacon, egg, and cheese biscuit on one occasion.  Subjective:   1. Vitamin D insufficiency Jamie Phillips is on OTC Vit D 5,000 IU daily. Her last Vit D level was at goal.  2. DDD (degenerative disc disease), lumbosacral Pt is seeing pain management. She notes back pain and sciatica today.  Assessment/Plan:   1. Vitamin D insufficiency Low Vitamin D level contributes to fatigue and are associated with obesity, breast, and colon cancer. She agrees to continue to take prescription Vitamin D 5,000 IU daily and will follow-up for routine testing of Vitamin D, at least 2-3 times per year to avoid over-replacement.  2. DDD (degenerative disc disease), lumbosacral Continue with weight loss.  3. Obesity, current BMI 41.6  [name] is currently in the action stage of change. As such, her goal is to continue with weight loss efforts. She has agreed to the Category 1 Plan.   Exercise goals:  As is  Behavioral modification strategies: decreasing simple carbohydrates and meal planning and cooking strategies.  Jamie Phillips has agreed to follow-up with our clinic in 2 weeks. She was informed of the importance of frequent follow-up visits to maximize her success with intensive lifestyle modifications for her multiple health conditions.   Objective:   Blood pressure (!) 144/79, pulse  60, temperature (!) 97.5 F (36.4 C), height 5\' 5"  (1.651 m), weight 250 lb (113.4 kg), SpO2 96 %. Body mass index is 41.6 kg/m.  General: Cooperative, alert, well developed, in no acute distress. HEENT: Conjunctivae and lids unremarkable. Cardiovascular: Regular rhythm.  Lungs: Normal work of breathing. Neurologic: No focal deficits.   Lab Results  Component Value Date   CREATININE 0.86 12/26/2020   BUN 15 12/26/2020   NA 142 12/26/2020   K 4.0 12/26/2020   CL 100 12/26/2020   CO2 26 12/26/2020   Lab Results  Component Value Date   ALT 13 12/26/2020   AST 18 12/26/2020   ALKPHOS 96 12/26/2020   BILITOT 0.3 12/26/2020   Lab Results  Component Value Date   HGBA1C 5.8 (H) 12/26/2020   Lab Results  Component Value Date   INSULIN 14.4 12/26/2020   Lab Results  Component Value Date   TSH 3.590 12/26/2020   Lab Results  Component Value Date   CHOL 240 (H) 12/26/2020   HDL 59 12/26/2020   LDLCALC 145 (H) 12/26/2020   TRIG 202 (H) 12/26/2020   Lab Results  Component Value Date   VD25OH 28.9 (L) 12/26/2020   Lab Results  Component Value Date   WBC 12.7 (H) 09/15/2020   HGB 13.2 09/15/2020   HCT 40.3 09/15/2020   MCV 97.6 09/15/2020   PLT 248 09/15/2020   Lab Results  Component Value Date   FERRITIN 86 12/18/2018    Obesity Behavioral Intervention:   Approximately 15 minutes were spent on the discussion below.  ASK: We discussed the diagnosis of obesity with [name] today and Jamie Phillips agreed to give Jamie Phillips permission to discuss obesity behavioral modification therapy today.  ASSESS: Jamie Phillips has the diagnosis of obesity and her BMI today is 41.7. Jamie Phillips is in the action stage of change.   ADVISE: Jamie Phillips was educated on the multiple health risks of obesity as well as the benefit of weight loss to improve her health. She was advised of the need for long term treatment and the importance of lifestyle modifications to improve her current health and to decrease her risk of  future health problems.  AGREE: Multiple dietary modification options and treatment options were discussed and Jamie Phillips agreed to follow the recommendations documented in the above note.  ARRANGE: Jamie Phillips was educated on the importance of frequent visits to treat obesity as outlined per CMS and USPSTF guidelines and agreed to schedule her next follow up appointment today.  Attestation Statements:   Reviewed by clinician on day of visit: allergies, medications, problem list, medical history, surgical history, family history, social history, and previous encounter notes.  Jamie Phillips, CMA, am acting as transcriptionist for [employer], PA-C.  I have reviewed the above documentation for accuracy and completeness, and I agree with the above. Jamie Potash, PA-C

## 2021-01-31 DIAGNOSIS — G4733 Obstructive sleep apnea (adult) (pediatric): Secondary | ICD-10-CM | POA: Diagnosis not present

## 2021-02-01 DIAGNOSIS — R0609 Other forms of dyspnea: Secondary | ICD-10-CM | POA: Diagnosis not present

## 2021-02-01 DIAGNOSIS — G4733 Obstructive sleep apnea (adult) (pediatric): Secondary | ICD-10-CM | POA: Diagnosis not present

## 2021-02-06 NOTE — Progress Notes (Signed)
PROVIDER NOTE: Information contained herein reflects review and annotations entered in association with encounter. Interpretation of such information and data should be left to medically-trained personnel. Information provided to patient can be located elsewhere in the medical record under "Patient Instructions". Document created using STT-dictation technology, any transcriptional errors that may result from process are unintentional.    Patient: Jamie Phillips  Service Category: E/M  Provider: Gaspar Cola, MD  DOB: 11-20-1940  DOS: 02/07/2021  Specialty: Interventional Pain Management  MRN: [id number]  Setting: Ambulatory outpatient  PCP: Kirk Ruths, MD  Type: Established Patient    Referring Provider: Kirk Ruths, MD  Location: Office  Delivery: Face-to-face     HPI  Ms. MEE MACDONNELL, a 80 y.o. year old female, is here today because of her Chronic pain of lower extremity, bilateral [M79.604, M79.605, G89.29]. Ms. Forinash primary complain today is Back Pain (low) Last encounter: My last encounter with her was on 01/23/2021. Pertinent problems: Ms. 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); Abnormal MRI, lumbar spine (01/24/2020); Lumbar facet syndrome (Multilevel) (Bilateral); Lumbar facet hypertrophy (Multilevel) (Bilateral); Severe muscle deconditioning; Lower extremity weakness  (Bilateral); Lumbosacral facet hypertrophy (Multilevel) (Bilateral); Lumbar scoliosis (idiopathic); Spondylolisthesis of lumbosacral region (Multilevel); Retrolisthesis (L1/L2); Anterolisthesis of lumbar spine (L3/L4, L4/L5); Anterolisthesis of lumbosacral spine (L5/S1); Lumbar lateral recess stenosis (Bilateral: L3-4, L4-5) (Severe); Lumbar foraminal stenosis (Right: L4-5); Synovial cyst of lumbar facet joint (L3-4) (Right); Arthropathy of spinal facet joint concurrent with and due to effusion (L3-4); Annular tear of lumbar disc (L3-4) (Left); Chronic lower extremity radicular pain (L1/L2 dermatomes) (Bilateral); Chronic groin pain (Bilateral); Neurogenic pain, lower extremity (Bilateral); Shooting pain, intermittent (electrical-like); History of total knee replacement (Bilateral) ; Spondylosis without myelopathy or radiculopathy, lumbosacral region; Chronic low back pain (2ry area of Pain) (Bilateral) w/o sciatica; Abnormal EMG (electromyogram) (05/09/2020); Abnormal CT scan, lumbar spine (06/16/2020); Chronic hip pain (Bilateral); Trochanteric bursitis of hips (Bilateral); Iliopsoas bursitis of hips; Decreased range of motion of lumbar spine; Osteoarthritis of hips (Bilateral); Sacroiliac joint dysfunction of right side; Chronic right sacroiliac joint pain; and 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HCC) on their pertinent problem list. Pain Assessment: Severity of Chronic pain is reported as a 6 /10. Location: Back Lower/legs. Onset: More than a month ago. Quality: Constant, Aching, Sharp. Timing: Constant. Modifying factor(s): medications. Vitals:  height is '5\' 4"'  (1.626 m) and weight is 252 lb (114.3 kg). Her temporal temperature is 97.3 F (36.3 C) (abnormal). Her blood pressure is 97/63 and her pulse is 61. Her respiration is 18 and oxygen saturation is 97%.   Reason for encounter: medication management.   The patient indicates doing well with the current medication regimen. No adverse reactions or  side effects reported to the medications.  However, the pill count would suggest that the patient has been taking more medication than prescribed.  Previously the patient indicating working with the [facility] to see if she could lose some more weight.  MRI of the right hip done on 12/21/2020 indicates the patient to  have no hip fracture, dislocation, or avascular necrosis.  She does seem to have some mild osteoarthritis of the right hip joint.  Incidentally, the MRI shows atrophy of the right gluteus minimus and medius muscles with a chronic tear of the right gluteus minimus tendon insertion.  There is also some mild partial-thickness articular cartilage loss in the right hip.  RTCB: 05/16/2021 Nonopioids transferred: Failed pending (written by Dr. Ouida Sills)  Pharmacotherapy Assessment  Analgesic: Tramadol 50 mg, 1 tab p.o. 4 times daily (200 mg/day of tramadol) MME/day: 20 mg/day   Monitoring: [place] PMP: PDMP reviewed during this encounter.       Pharmacotherapy: No side-effects or adverse reactions reported. Compliance: No problems identified. Effectiveness: Clinically acceptable.  Hart Rochester, RN  02/07/2021 10:26 AM  Signed Nursing Pain Medication Assessment:  Safety precautions to be maintained throughout the outpatient stay will include: orient to surroundings, keep bed in low position, maintain call bell within reach at all times, provide assistance with transfer out of bed and ambulation.  Medication Inspection Compliance: Pill count conducted under aseptic conditions, in front of the patient. Neither the pills nor the bottle was removed from the patient's sight at any time. Once count was completed pills were immediately returned to the patient in their original bottle.  Medication: Oxycodone IR Pill/Patch Count:  10 of 65 pills remain Pill/Patch Appearance: Markings consistent with prescribed medication Bottle Appearance: Standard pharmacy container.  Clearly labeled. Filled Date: 09 / 29 / 2022 Last Medication intake:  Yesterday    UDS:  Summary  Date Value Ref Range Status  07/27/2020 Note  Final    Comment:    ==================================================================== [employer] Select 13 (MW) ==================================================================== Test                             Result       Flag       Units  Drug Present and Declared for Prescription Verification   Oxycodone                      1206         EXPECTED   ng/mg creat   Oxymorphone                    90           EXPECTED   ng/mg creat   Noroxycodone                   2368         EXPECTED   ng/mg creat    Sources of oxycodone include scheduled prescription medications.    Oxymorphone and noroxycodone are expected metabolites of oxycodone.    Oxymorphone is also available as a scheduled prescription medication.  ==================================================================== Test                      Result    Flag   Units      Ref Range   Creatinine              212              mg/dL      >=20 ==================================================================== Declared Medications:  The flagging and interpretation on this report are based on the  following declared medications.  Unexpected results may arise from  inaccuracies in the declared medications.   **Note: The testing scope of this  panel includes these medications:   Oxycodone   **Note: The testing scope of this panel does not include the  following reported medications:   Acetaminophen (Tylenol)  Albuterol (Ventolin HFA)  Apixaban (Eliquis)  Cholestyramine (Questran)  Duloxetine (Cymbalta)  Escitalopram (Lexapro)  Gabapentin (Neurontin)  Hydrochlorothiazide (Hyzaar)  Losartan (Hyzaar)  Pantoprazole (Protonix)  Pravastatin (Pravachol)  Sucralfate (Carafate) ==================================================================== For clinical consultation, please  call [phone]. ====================================================================      ROS  Constitutional: Denies any fever or chills Gastrointestinal: No reported hemesis, hematochezia, vomiting, or acute GI distress Musculoskeletal: Denies any acute onset joint swelling, redness, loss of ROM, or weakness Neurological: No reported episodes of acute onset apraxia, aphasia, dysarthria, agnosia, amnesia, paralysis, loss of coordination, or loss of consciousness  Medication Review  DULoxetine, Multiple Vitamins-Minerals, Turmeric, Vitamin D3, acetaminophen, apixaban, calcium carbonate, cholestyramine, escitalopram, gabapentin, losartan-hydrochlorothiazide, magnesium gluconate, oxyCODONE, pantoprazole, pravastatin, propranolol, and sucralfate  History Review  Allergy: Ms. Burnsworth has No Known Allergies. Drug: Ms. Vandevander  reports no history of drug use. Alcohol:  reports no history of alcohol use. Tobacco:  reports that she has never smoked. She has never used smokeless tobacco. Social: Ms. Gfeller  reports that she has never smoked. She has never used smokeless tobacco. She reports that she does not drink alcohol and does not use drugs. 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), Obesity, Pulmonary embolism ([place]), PVD (peripheral vascular disease) ([place]), and Sleep apnea. Surgical: Ms. Hanko  has a past surgical history that includes neck fusion; Wrist surgery (Left, 2000); Cardiac catheterization (N/A, 08/15/2015); IVC FILTER INSERTION (N/A,  06/11/2016); Colonoscopy; Joint replacement (Left, 06/17/2016); Colonoscopy with propofol (N/A, 03/10/2017); IVC FILTER REMOVAL (N/A, 05/06/2017); laparoscopy (N/A, 10/10/2018); laparoscopic appendectomy (N/A, 10/10/2018); Partial colectomy (Right, 10/10/2018); Appendectomy; Esophagogastroduodenoscopy (egd) with propofol (N/A, 02/02/2019); and Breast biopsy (Left, 08/19/04). Family: family history includes Congestive Heart Failure in her father; Heart failure in her father; Obesity in her mother; Pulmonary embolism in her mother; Sudden death in her father and mother.  Laboratory Chemistry Profile   Renal Lab Results  Component Value Date   BUN 15 12/26/2020   CREATININE 0.86 12/26/2020   BCR 17 12/26/2020   GFRAA 55 (L) 09/03/2019   GFRNONAA 59 (L) 09/15/2020    Hepatic Lab Results  Component Value Date   AST 18 12/26/2020   ALT 13 12/26/2020   ALBUMIN 4.0 12/26/2020   ALKPHOS 96 12/26/2020   LIPASE 37 10/09/2018    Electrolytes Lab Results  Component Value Date   NA 142 12/26/2020   K 4.0 12/26/2020   CL 100 12/26/2020   CALCIUM 9.6 12/26/2020   MG 1.9 06/21/2020    Bone Lab Results  Component Value Date   VD25OH 28.9 (L) 12/26/2020   25OHVITD1 14 (L) 06/21/2020   25OHVITD2 <1.0 06/21/2020   25OHVITD3 14 06/21/2020    Inflammation (CRP: Acute Phase) (ESR: Chronic Phase) Lab Results  Component Value Date   CRP 9 06/21/2020   ESRSEDRATE 21 06/21/2020   LATICACIDVEN 0.9 12/13/2018         Note: Above Lab results reviewed.  Recent Imaging Review  MM 3D SCREEN BREAST BILATERAL CLINICAL DATA:  Screening.  EXAM: DIGITAL SCREENING BILATERAL MAMMOGRAM WITH TOMOSYNTHESIS  AND CAD  TECHNIQUE: Bilateral screening digital craniocaudal and mediolateral oblique mammograms were obtained. Bilateral screening digital breast tomosynthesis was performed. The images were evaluated with computer-aided detection.  COMPARISON:  Previous exam(s).  ACR Breast Density Category b:  There are scattered areas of fibroglandular density.  FINDINGS: There are no findings suspicious for malignancy.  IMPRESSION: No mammographic evidence of malignancy. A result letter of this screening mammogram will be mailed directly to the patient.  RECOMMENDATION: Screening mammogram in one year. (Code:SM-B-01Y)  BI-RADS CATEGORY  1: Negative.  Electronically Signed   By: Abelardo Diesel M.D.   On: 01/26/2021 10:53 Note: Reviewed        Physical Exam  General appearance: Well nourished, well developed, and well hydrated. In no apparent acute distress Mental status: Alert, oriented x 3 (person, place, & time)       Respiratory: No evidence of acute respiratory distress Eyes: PERLA Vitals: BP 97/63   Pulse 61   Temp (!) 97.3 F (36.3 C) (Temporal)   Resp 18   Ht '5\' 4"'  (1.626 m)   Wt 252 lb (114.3 kg)   SpO2 97%   BMI 43.26 kg/m  BMI: Estimated body mass index is 43.26 kg/m as calculated from the following:   Height as of this encounter: '5\' 4"'  (1.626 m).   Weight as of this encounter: 252 lb (114.3 kg). Ideal: Ideal body weight: 54.7 kg (120 lb 9.5 oz) Adjusted ideal body weight: 78.5 kg (173 lb 2.5 oz)  Assessment   Status Diagnosis  Controlled Controlled Controlled 1. Chronic lower extremity pain (1ry area of Pain) (Bilateral)   2. Chronic low back pain (2ry area of Pain) (Bilateral) w/o sciatica   3. Lumbar facet syndrome (Multilevel) (Bilateral)   4. Chronic hip pain (Bilateral)   5. Chronic groin pain (Bilateral)   6. Chronic pain syndrome   7. Pharmacologic therapy   8. Chronic use of opiate for therapeutic purpose   9. Encounter for medication management   10. Neurogenic pain, leg, unspecified laterality      Updated Problems: Problem  Chronic low back pain (2ry area of Pain) (Bilateral) w/o sciatica  Chronic low back pain (Bilateral) w/ sciatica (Bilateral)  Chronic Pulmonary Embolism (Hcc)   Formatting of this note might be different from the  original. 4-17, eliquis started: with cll will continue  Last Assessment & Plan:  Formatting of this note might be different from the original. No cp or sob  Formatting of this note might be different from the original. 4-17, eliquis started: with cll will continue  Last Assessment & Plan:  Formatting of this note might be different from the original. Staying on eliquis long term given cll  Formatting of this note might be different from the original. 4-17, eliquis started: with cll will continue  Last Assessment & Plan:  Formatting of this note might be different from the original. Continuing on eliquis given cll  Formatting of this note might be different from the original. 4-17, eliquis started: with cll will continue  Last Assessment & Plan:  Formatting of this note might be different from the original. Stable on eliquis   Depression, Major, in Remission (Hcc)   Last Assessment & Plan:  Formatting of this note might be different from the original. Mood is doing well currently, previously with multiple symptoms  Last Assessment & Plan:  Formatting of this note might be different from the original. Mood continues to do well  Last Assessment & Plan:  Formatting  of this note might be different from the original. Mood is essentially stable  Last Assessment & Plan:  Formatting of this note might be different from the original. Mood is stable     Plan of Care  Problem-specific:  No problem-specific Assessment & Plan notes found for this encounter.  Ms. DEMAYA HARDGE has a current medication list which includes the following long-term medication(s): calcium carbonate, cholestyramine, duloxetine, eliquis, escitalopram, gabapentin, losartan-hydrochlorothiazide, [START ON 03/17/2021] oxycodone, [START ON 04/16/2021] oxycodone, pantoprazole, and [START ON 02/15/2021] oxycodone.  Pharmacotherapy (Medications Ordered): Meds ordered this encounter  Medications    oxyCODONE (OXY IR/ROXICODONE) 5 MG immediate release tablet    Sig: Take 1 tablet (5 mg total) by mouth 3 (three) times daily as needed for severe pain. Must last 30 days.    Dispense:  65 tablet    Refill:  0    DO NOT: delete (not duplicate); no partial-fill (will deny script to complete), no refill request (F/U required). DISPENSE: 1 day early if closed on fill date. WARN: No CNS-depressants within 8 hrs of med.   oxyCODONE (OXY IR/ROXICODONE) 5 MG immediate release tablet    Sig: Take 1 tablet (5 mg total) by mouth 3 (three) times daily as needed for severe pain. Must last 30 days.    Dispense:  65 tablet    Refill:  0    DO NOT: delete (not duplicate); no partial-fill (will deny script to complete), no refill request (F/U required). DISPENSE: 1 day early if closed on fill date. WARN: No CNS-depressants within 8 hrs of med.   oxyCODONE (OXY IR/ROXICODONE) 5 MG immediate release tablet    Sig: Take 1 tablet (5 mg total) by mouth 3 (three) times daily as needed for severe pain. Must last 30 days.    Dispense:  65 tablet    Refill:  0    DO NOT: delete (not duplicate); no partial-fill (will deny script to complete), no refill request (F/U required). DISPENSE: 1 day early if closed on fill date. WARN: No CNS-depressants within 8 hrs of med.   ketorolac (TORADOL) injection 60 mg   orphenadrine (NORFLEX) injection 60 mg    Orders:  No orders of the defined types were placed in this encounter.  Follow-up plan:   Return in about 14 weeks (around 05/16/2021) for Eval-day (M,W), (F2F), (MM).     Interventional Therapies  Risk  Complexity Considerations:   Estimated body mass index is 43.6 kg/m as calculated from the following:   Height as of this encounter: '5\' 5"'  (1.651 m).   Weight as of this encounter: 262 lb (118.8 kg). NOTE: Eliquis Anticoagulation: (Stop: 3 days  Restart: 6 hrs)   Planned  Pending:      Under consideration:   Diagnostic bilateral iliopsoas bursa injection #1   Diagnostic midline T12-L1 LESI    Completed:   Diagnostic bilateral IA hip joint injection x1 (11/30/2020) (100/100/80/80) Diagnostic bilateral lumbar facet MBB x3 (01/04/2021) (100/50/0/70)  Diagnostic bilateral L3 TFESI x3 (11/07/2020) (100/100/100/100% relief of the bilateral L1/L2 radiculitis)  Diagnostic right L2-3 LESI x1 (07/11/2020) (100/100/100/100% relief of the bilateral L1/L2 radiculitis)    Therapeutic  Palliative (PRN) options:   Therapeutic right L2-3 LESI #2  Therapeutic bilateral L3 transforaminal ESI #4  Diagnostic bilateral lumbar facet MBB #3     Recent Visits Date Type Provider Dept  01/23/21 Office Visit Milinda Pointer, MD [facility]  01/04/21 Procedure visit Milinda Pointer, [facility]  12/12/20 Office Visit  Milinda Pointer, MD [facility]  11/30/20 Procedure visit Milinda Pointer, MD [facility]  11/21/20 Procedure visit Milinda Pointer, MD [facility]  11/15/20 Office Visit Milinda Pointer, MD [facility]  Showing recent visits within past 90 days and meeting all other requirements Today's Visits Date Type Provider Dept  02/07/21 Office Visit Milinda Pointer, MD [facility]  Showing today's visits and meeting all other requirements Future Appointments No visits were found meeting these conditions. Showing future appointments within next 90 days and meeting all other requirements I discussed the assessment and treatment plan with the patient. The patient was provided an opportunity to ask questions and all were answered. The patient agreed with the plan and demonstrated an understanding of the instructions.  Patient advised to call back or seek an in-person evaluation if the symptoms or condition worsens.  Duration of encounter: 30 minutes.  Note by: Gaspar Cola, MD Date: 02/07/2021; Time: 3:13 PM

## 2021-02-07 ENCOUNTER — Encounter: Payer: Self-pay | Admitting: Pain Medicine

## 2021-02-07 ENCOUNTER — Ambulatory Visit: Payer: Medicare HMO | Attending: Pain Medicine | Admitting: Pain Medicine

## 2021-02-07 ENCOUNTER — Other Ambulatory Visit: Payer: Self-pay

## 2021-02-07 VITALS — BP 97/63 | HR 61 | Temp 97.3°F | Resp 18 | Ht 64.0 in | Wt 252.0 lb

## 2021-02-07 DIAGNOSIS — G8929 Other chronic pain: Secondary | ICD-10-CM | POA: Insufficient documentation

## 2021-02-07 DIAGNOSIS — R1032 Left lower quadrant pain: Secondary | ICD-10-CM | POA: Diagnosis present

## 2021-02-07 DIAGNOSIS — M792 Neuralgia and neuritis, unspecified: Secondary | ICD-10-CM | POA: Diagnosis not present

## 2021-02-07 DIAGNOSIS — M545 Low back pain, unspecified: Secondary | ICD-10-CM | POA: Insufficient documentation

## 2021-02-07 DIAGNOSIS — M25552 Pain in left hip: Secondary | ICD-10-CM | POA: Insufficient documentation

## 2021-02-07 DIAGNOSIS — M79605 Pain in left leg: Secondary | ICD-10-CM | POA: Insufficient documentation

## 2021-02-07 DIAGNOSIS — G894 Chronic pain syndrome: Secondary | ICD-10-CM | POA: Insufficient documentation

## 2021-02-07 DIAGNOSIS — M79604 Pain in right leg: Secondary | ICD-10-CM | POA: Diagnosis not present

## 2021-02-07 DIAGNOSIS — M47816 Spondylosis without myelopathy or radiculopathy, lumbar region: Secondary | ICD-10-CM | POA: Insufficient documentation

## 2021-02-07 DIAGNOSIS — Z79899 Other long term (current) drug therapy: Secondary | ICD-10-CM | POA: Diagnosis not present

## 2021-02-07 DIAGNOSIS — Z79891 Long term (current) use of opiate analgesic: Secondary | ICD-10-CM | POA: Insufficient documentation

## 2021-02-07 DIAGNOSIS — R1031 Right lower quadrant pain: Secondary | ICD-10-CM | POA: Diagnosis not present

## 2021-02-07 DIAGNOSIS — M25551 Pain in right hip: Secondary | ICD-10-CM | POA: Insufficient documentation

## 2021-02-07 MED ORDER — OXYCODONE HCL 5 MG PO TABS: 5.0000 mg | ORAL_TABLET | Freq: Three times a day (TID) | ORAL | 0 refills | Status: DC | PRN

## 2021-02-07 MED ORDER — KETOROLAC TROMETHAMINE 60 MG/2ML IM SOLN
60.0000 mg | Freq: Once | INTRAMUSCULAR | Status: AC
Start: 2021-02-07 — End: 2021-02-07
  Administered 2021-02-07: 60 mg via INTRAMUSCULAR
  Filled 2021-02-07: qty 2

## 2021-02-07 MED ORDER — ORPHENADRINE CITRATE 30 MG/ML IJ SOLN
60.0000 mg | Freq: Once | INTRAMUSCULAR | Status: AC
  Administered 2021-02-07: 60 mg via INTRAMUSCULAR
  Filled 2021-02-07: qty 2

## 2021-02-07 NOTE — Patient Instructions (Signed)

## 2021-02-07 NOTE — Progress Notes (Signed)
Nursing Pain Medication Assessment:  Safety precautions to be maintained throughout the outpatient stay will include: orient to surroundings, keep bed in low position, maintain call bell within reach at all times, provide assistance with transfer out of bed and ambulation.  Medication Inspection Compliance: Pill count conducted under aseptic conditions, in front of the patient. Neither the pills nor the bottle was removed from the patient's sight at any time. Once count was completed pills were immediately returned to the patient in their original bottle.  Medication: Oxycodone IR Pill/Patch Count:  10 of 65 pills remain Pill/Patch Appearance: Markings consistent with prescribed medication Bottle Appearance: Standard pharmacy container. Clearly labeled. Filled Date: 09 / 29 / 2022 Last Medication intake:  Yesterday

## 2021-02-13 ENCOUNTER — Encounter: Payer: Self-pay | Admitting: Emergency Medicine

## 2021-02-13 ENCOUNTER — Other Ambulatory Visit: Payer: Self-pay

## 2021-02-13 ENCOUNTER — Emergency Department: Payer: Medicare HMO

## 2021-02-13 DIAGNOSIS — I129 Hypertensive chronic kidney disease with stage 1 through stage 4 chronic kidney disease, or unspecified chronic kidney disease: Secondary | ICD-10-CM | POA: Diagnosis not present

## 2021-02-13 DIAGNOSIS — R0789 Other chest pain: Secondary | ICD-10-CM | POA: Diagnosis not present

## 2021-02-13 DIAGNOSIS — Z79899 Other long term (current) drug therapy: Secondary | ICD-10-CM | POA: Insufficient documentation

## 2021-02-13 DIAGNOSIS — Z856 Personal history of leukemia: Secondary | ICD-10-CM | POA: Insufficient documentation

## 2021-02-13 DIAGNOSIS — Z20822 Contact with and (suspected) exposure to covid-19: Secondary | ICD-10-CM | POA: Insufficient documentation

## 2021-02-13 DIAGNOSIS — R0602 Shortness of breath: Secondary | ICD-10-CM | POA: Diagnosis not present

## 2021-02-13 DIAGNOSIS — Z8616 Personal history of COVID-19: Secondary | ICD-10-CM | POA: Diagnosis not present

## 2021-02-13 DIAGNOSIS — N183 Chronic kidney disease, stage 3 unspecified: Secondary | ICD-10-CM | POA: Insufficient documentation

## 2021-02-13 DIAGNOSIS — Z96653 Presence of artificial knee joint, bilateral: Secondary | ICD-10-CM | POA: Diagnosis not present

## 2021-02-13 DIAGNOSIS — R0689 Other abnormalities of breathing: Secondary | ICD-10-CM | POA: Diagnosis not present

## 2021-02-13 DIAGNOSIS — R0902 Hypoxemia: Secondary | ICD-10-CM | POA: Diagnosis not present

## 2021-02-13 DIAGNOSIS — J9811 Atelectasis: Secondary | ICD-10-CM | POA: Diagnosis not present

## 2021-02-13 DIAGNOSIS — M545 Low back pain, unspecified: Secondary | ICD-10-CM | POA: Diagnosis not present

## 2021-02-13 DIAGNOSIS — G8929 Other chronic pain: Secondary | ICD-10-CM | POA: Diagnosis not present

## 2021-02-13 LAB — BASIC METABOLIC PANEL
Anion gap: 8 (ref 5–15)
BUN: 28 mg/dL — ABNORMAL HIGH (ref 8–23)
CO2: 28 mmol/L (ref 22–32)
Calcium: 9.1 mg/dL (ref 8.9–10.3)
Chloride: 104 mmol/L (ref 98–111)
Creatinine, Ser: 1.15 mg/dL — ABNORMAL HIGH (ref 0.44–1.00)
GFR, Estimated: 48 mL/min — ABNORMAL LOW (ref >60)
Glucose, Bld: 112 mg/dL — ABNORMAL HIGH (ref 70–99)
Potassium: 4.1 mmol/L (ref 3.5–5.1)
Sodium: 140 mmol/L (ref 135–145)

## 2021-02-13 LAB — CBC
HCT: 41 % (ref 36.0–46.0)
Hemoglobin: 13.9 g/dL (ref 12.0–15.0)
MCH: 33.3 pg (ref 26.0–34.0)
MCHC: 33.9 g/dL (ref 30.0–36.0)
MCV: 98.3 fL (ref 80.0–100.0)
Platelets: 280 10*3/uL (ref 150–400)
RBC: 4.17 MIL/uL (ref 3.87–5.11)
RDW: 15 % (ref 11.5–15.5)
WBC: 20 10*3/uL — ABNORMAL HIGH (ref 4.0–10.5)
nRBC: 0 % (ref 0.0–0.2)

## 2021-02-13 LAB — TROPONIN I (HIGH SENSITIVITY)
Troponin I (High Sensitivity): 5 ng/L (ref <18)
Troponin I (High Sensitivity): 6 ng/L (ref <18)

## 2021-02-13 LAB — RESP PANEL BY RT-PCR (FLU A&B, COVID) ARPGX2
Influenza A by PCR: NEGATIVE
Influenza B by PCR: NEGATIVE
SARS Coronavirus 2 by RT PCR: NEGATIVE

## 2021-02-13 MED ORDER — IOHEXOL 350 MG/ML SOLN
75.0000 mL | Freq: Once | INTRAVENOUS | Status: AC | PRN
  Administered 2021-02-13: 75 mL via INTRAVENOUS

## 2021-02-13 NOTE — ED Triage Notes (Signed)
Pt to ED via EMS from home c/o onset SOB x1 hr PTA.  Denies cough, or chest pain, pain between shoulder blades though.  Pt takes blood thinner and has hx of blood clots.  Pt fell at the end of September.  Pt A&Ox4, lung sounds clear, in NAD at this time.

## 2021-02-13 NOTE — ED Notes (Signed)
Pt brought in from home by [facility] with acute onset of shob. 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 Had novocaine this am for dental procedure, symptoms since.

## 2021-02-13 NOTE — ED Provider Notes (Signed)
Emergency Medicine Provider Triage Evaluation Note  Jamie Phillips , a 80 y.o. female  was evaluated in triage.  Pt complains of shortness of breath and some discomfort between her shoulder blades but reports she has very chronic back pain but pain between her shoulder blades is a little unusual  Review of Systems  Positive: Shortness of breath Negative: Chest pain or discomfort.  Denies wheezing  Reports compliant with her anticoagulation therapy.does have a history of pulmonary embolism in the past.  Physical Exam  BP 112/76   Pulse 77   Temp 97.8 F (36.6 C) (Oral)   Resp (!) 22   Ht 5\' 4"  (1.626 m)   Wt 114.3 kg   SpO2 93%   BMI 43.26 kg/m  Gen:   Awake, no distress   Resp:  Normal effort and clear lung sounds except for just slight tachypnea with respiratory rate approximately 20 -25.  He is making full and clear sentences just appears mildly dyspneic.  She is not in any acute distress or extremis lung sounds are clear MSK:   Moves extremities without difficulty  Other:    Medical Decision Making  Medically screening exam initiated at 8:12 PM.  Appropriate orders placed.  Jamie Phillips was informed that the remainder of the evaluation will be completed by another provider, this initial triage assessment does not replace that evaluation, and the importance of remaining in the ED until their evaluation is complete.  Discussed with the patient and have ordered additional testing including chest x-ray, CT scan of the chest to exclude pulmonary embolism.  I do of concern for this especially given the sudden onset of dyspnea clear lung sounds on examination and her given history that she does report being anticoagulated  EKG minimal nonspecific T wave abnormalities, but I do not see evidence of acute STEMI or obvious ACS at this time.  Troponin sent.  Not having active chest pain but rather does report some pain discomfort during her shoulder blades along with dyspnea.     Delman Kitten, MD 02/13/21 2015

## 2021-02-14 ENCOUNTER — Emergency Department
Admission: EM | Admit: 2021-02-14 | Discharge: 2021-02-14 | Disposition: A | Discharge status: Home / Self Care | Payer: Medicare HMO | Attending: Emergency Medicine | Admitting: Emergency Medicine

## 2021-02-14 ENCOUNTER — Ambulatory Visit (INDEPENDENT_AMBULATORY_CARE_PROVIDER_SITE_OTHER): Payer: Medicare HMO | Admitting: Family Medicine

## 2021-02-14 DIAGNOSIS — R0602 Shortness of breath: Secondary | ICD-10-CM

## 2021-02-14 MED ORDER — OXYCODONE HCL 5 MG PO TABS
5.0000 mg | ORAL_TABLET | Freq: Once | ORAL | Status: AC
  Administered 2021-02-14: 5 mg via ORAL
  Filled 2021-02-14: qty 1

## 2021-02-14 MED ORDER — IPRATROPIUM-ALBUTEROL 0.5-2.5 (3) MG/3ML IN SOLN
3.0000 mL | Freq: Once | RESPIRATORY_TRACT | Status: AC
  Administered 2021-02-14: 3 mL via RESPIRATORY_TRACT
  Filled 2021-02-14: qty 3

## 2021-02-14 MED ORDER — ACETAMINOPHEN 500 MG PO TABS
1000.0000 mg | ORAL_TABLET | Freq: Once | ORAL | Status: AC
  Administered 2021-02-14: 1000 mg via ORAL
  Filled 2021-02-14: qty 2

## 2021-02-14 NOTE — ED Provider Notes (Signed)
[facility] Emergency Department Provider Note  ____________________________________________  Time seen: Approximately 6:24 AM  I have reviewed the triage vital signs and the nursing notes.   HISTORY  Chief Complaint Shortness of Breath   HPI Jamie Phillips is a 80 y.o. female with a history of CLL, chronic back pain, hypertension, hyperlipidemia, PE on Eliquis who presents for evaluation of shortness of breath.  Patient reports that she was in her usual state of health this morning.  She went to the dentist to have a cavity filled.  She received Novocain injection.  Upon arriving home she started having shortness of breath.  She reports feeling pretty severely short of breath and having severe tightness in her chest and upper back between her shoulder blades.  She has been compliant with her Eliquis and has not missed any doses.  She denies cough or congestion, fever or chills, hemoptysis.  At this time she reports that her chest tightness has resolved but she still feels very slight short of breath.  She denies any wheezing.  Patient reports that she feels that her shortness of breath is coming from her back pain.  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)    Macular degeneration    MGUS (monoclonal gammopathy of unknown significance)    Morbid obesity (HCC)    Obesity    Pulmonary embolism (HCC)    PVD  (peripheral vascular disease) (HCC)    Sleep apnea     Patient Active Problem List   Diagnosis Date Noted   Hypocalcemia 12/12/2020   Sacroiliac joint dysfunction of right side 12/12/2020   Chronic right sacroiliac joint pain 12/12/2020   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[place]) 12/12/2020   Chronic hip pain (Bilateral) 11/15/2020   Trochanteric bursitis of hips (Bilateral) 11/15/2020   Iliopsoas bursitis of hips 11/15/2020   Decreased range of motion of lumbar spine 11/15/2020   Osteoarthritis of hips (Bilateral) 11/15/2020   Abnormal CT scan, lumbar spine (06/16/2020) 09/20/2020   Abnormal EMG (electromyogram) (05/09/2020) 09/07/2020   Spondylosis without myelopathy or radiculopathy, lumbosacral region 08/15/2020   Chronic low back pain (2ry area of Pain) (Bilateral) w/o sciatica 08/15/2020   Chronic use of opiate for therapeutic purpose 07/27/2020   Vitamin D deficiency 07/27/2020   Chronic lower extremity radicular pain (L1/L2 dermatomes) (Bilateral) 06/21/2020   Chronic groin pain (Bilateral) 06/21/2020   Neurogenic pain, lower extremity (Bilateral) 06/21/2020   Shooting pain, intermittent (electrical-like) 06/21/2020   History of total knee replacement (Bilateral)  06/21/2020   Chronic low back pain (Bilateral) w/ sciatica (Bilateral) 04/24/2020   Lumbosacral radiculopathy at L1 (Bilateral) 04/24/2020   Lumbosacral radiculopathy at L5 (Left) 04/24/2020   Lumbosacral radiculopathy at S1 (Right) 04/24/2020   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) 04/24/2020   Abnormal MRI, lumbar spine (01/24/2020) 04/24/2020   Lumbar facet syndrome (Multilevel) (Bilateral) 04/24/2020   Lumbar facet hypertrophy (Multilevel) (Bilateral) 04/24/2020   Severe muscle deconditioning 04/24/2020   Lower extremity weakness (Bilateral) 04/24/2020   Lumbosacral facet hypertrophy (Multilevel) (Bilateral) 04/24/2020   Lumbar scoliosis (idiopathic) 04/24/2020    Spondylolisthesis of lumbosacral region (Multilevel) 04/24/2020   Retrolisthesis (L1/L2) 04/24/2020   Anterolisthesis of lumbar spine (L3/L4, L4/L5) 04/24/2020   Anterolisthesis of lumbosacral spine (L5/S1) 04/24/2020   Lumbar lateral recess stenosis (Bilateral: L3-4, L4-5) (Severe) 04/24/2020   Lumbar foraminal stenosis (Right: L4-5) 04/24/2020   Synovial cyst of lumbar facet joint (L3-4) (Right) 04/24/2020   Arthropathy of spinal facet joint concurrent with and due to effusion (L3-4) 04/24/2020   Annular tear of lumbar disc (L3-4) (Left) 04/24/2020   Adnexal cyst 04/23/2020   Chronic pain syndrome 04/23/2020   Pharmacologic therapy 04/23/2020   Disorder of skeletal system 04/23/2020   Problems influencing health status 04/23/2020   Chronic anticoagulation (Eliquis) 03/06/2020   Other fatigue 03/07/2019   Monoclonal gammopathy 03/04/2019   COVID-19 12/14/2018   Walker as ambulation aid 11/04/2018   Right lower quadrant pain 10/09/2018   DDD (degenerative disc disease), lumbosacral 03/27/2018   RLS (restless legs syndrome) 10/07/2017   Morbid obesity with BMI of 40.0-44.9, adult (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[place]) 08/25/2015   Acute respiratory failure ([place]) 08/14/2015   Acute bronchitis 05/11/2015   Essential hypertension 05/04/2015   Hyperlipidemia 05/04/2015   Pain in the chest 05/04/2015   SOB (shortness of breath) 05/04/2015   Hyperglycemia, unspecified 10/13/2014   Prediabetes 10/13/2014   Depression, major, 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   CLL (chronic lymphocytic leukemia) ([place]) 01/23/2014   Generalized OA  01/23/2014   GERD without esophagitis 01/23/2014   History of Bell's palsy 01/23/2014   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[place]) 01/23/2014   OSA on CPAP 01/23/2014   Pure hypercholesterolemia 01/23/2014    Past Surgical History:  Procedure Laterality Date   APPENDECTOMY     BREAST BIOPSY Left 08/19/04   lt bx/clip-neg   COLONOSCOPY     COLONOSCOPY WITH PROPOFOL N/A 03/10/2017   Procedure: COLONOSCOPY WITH PROPOFOL;  Surgeon: [name], MD;  Location: [facility] ENDOSCOPY;  Service: Endoscopy;  Laterality: N/A;   ESOPHAGOGASTRODUODENOSCOPY (EGD) WITH PROPOFOL N/A 02/02/2019   Procedure: ESOPHAGOGASTRODUODENOSCOPY (EGD) WITH PROPOFOL;  Surgeon: Toledo, Benay Pike, MD;  Location: [facility] ENDOSCOPY;  Service: Endoscopy;  Laterality: N/A;   IVC FILTER INSERTION N/A 06/11/2016   Procedure: IVC Filter Insertion;  Surgeon: Katha Cabal, MD;  Location: [place] CV LAB;  Service: Cardiovascular;  Laterality: N/A;   IVC FILTER REMOVAL N/A 05/06/2017   Procedure: IVC FILTER REMOVAL;  Surgeon: Katha Cabal, MD;  Location: [place] CV LAB;  Service: Cardiovascular;  Laterality: N/A;   JOINT REPLACEMENT Left 06/17/2016   LAPAROSCOPIC APPENDECTOMY N/A 10/10/2018   Procedure: APPENDECTOMY LAPAROSCOPIC ATTEMPTED CONVERTED TO OPEN;  Surgeon: Herbert Pun, MD;  Location: [facility] ORS;  Service: General;  Laterality: N/A;   LAPAROSCOPY N/A 10/10/2018   Procedure: LAPAROSCOPY DIAGNOSTIC ATTEMPTED CONVERTED TO OPEN;  Surgeon: Herbert Pun, MD;  Location: [facility]  ORS;  Service: General;  Laterality: N/A;   neck fusion     PARTIAL COLECTOMY Right 10/10/2018   Procedure: PARTIAL COLECTOMY;  Surgeon: Herbert Pun, MD;  Location: [facility] ORS;  Service: General;  Laterality: Right;   PERIPHERAL VASCULAR CATHETERIZATION N/A 08/15/2015   Procedure: pulmonary angiogram with lysis;  Surgeon: Katha Cabal, MD;  Location: [place] CV LAB;  Service: Cardiovascular;   Laterality: N/A;   WRIST SURGERY Left 2000    Prior to Admission medications   Medication Sig Start Date End Date Taking? Authorizing Provider  acetaminophen (TYLENOL) 650 MG CR tablet Take 650-1,300 mg by mouth every 8 (eight) hours as needed for pain. TYLENOL ARTHRITIS    [provider]  calcium carbonate (OSCAL) 1500 (600 Ca) MG TABS tablet Take 1 tablet (1,500 mg total) by mouth 2 (two) times daily with a meal. 12/12/20 03/12/21  Milinda Pointer, MD  Cholecalciferol (VITAMIN D3) 125 MCG (5000 UT) CAPS Take 1 capsule (5,000 Units total) by mouth daily with breakfast. Take along with calcium and magnesium. 12/12/20 03/12/21  Milinda Pointer, MD  cholestyramine (QUESTRAN) 4 g packet Take 4 g by mouth 3 (three) times daily with meals.    [provider]  DULoxetine (CYMBALTA) 60 MG capsule Take 60 mg by mouth daily. 07/25/20   [provider]  ELIQUIS 5 MG TABS tablet TAKE 1 TABLET TWICE DAILY Patient taking differently: Take 5 mg by mouth 2 (two) times daily. 01/03/16   Cammie Sickle, MD  escitalopram (LEXAPRO) 10 MG tablet Take 10 mg by mouth daily.    [provider]  gabapentin (NEURONTIN) 400 MG capsule Take 1 capsule (400 mg total) by mouth 3 (three) times daily. 12/12/20   Milinda Pointer, MD  losartan-hydrochlorothiazide (HYZAAR) 100-12.5 MG tablet Take 1 tablet by mouth daily. Reported on 08/22/2015    [provider]  magnesium gluconate (MAGONATE) 500 MG tablet Take 500 mg by mouth 2 (two) times daily.    [provider]  Multiple Vitamins-Minerals (PRESERVISION AREDS 2 PO) Take 1 tablet by mouth in the morning and at bedtime.    [provider]  oxyCODONE (OXY IR/ROXICODONE) 5 MG immediate release tablet Take 1 tablet (5 mg total) by mouth 3 (three) times daily as needed for severe pain. Must last 30 days. 02/15/21 03/17/21  Milinda Pointer, MD  oxyCODONE (OXY IR/ROXICODONE) 5 MG immediate release tablet Take 1  tablet (5 mg total) by mouth 3 (three) times daily as needed for severe pain. Must last 30 days. 03/17/21 04/16/21  Milinda Pointer, MD  oxyCODONE (OXY IR/ROXICODONE) 5 MG immediate release tablet Take 1 tablet (5 mg total) by mouth 3 (three) times daily as needed for severe pain. Must last 30 days. 04/16/21 05/16/21  Milinda Pointer, MD  pantoprazole (PROTONIX) 40 MG tablet Take 1 tablet (40 mg total) by mouth 2 (two) times daily for 14 days. Patient taking differently: Take 40 mg by mouth daily. 10/17/18 02/07/21  Herbert Pun, MD  pravastatin (PRAVACHOL) 40 MG tablet Take 40 mg by mouth daily.     [provider]  propranolol (INDERAL) 40 MG tablet Take 40 mg by mouth daily. 09/28/20   [provider]  sucralfate (CARAFATE) 1 g tablet Take 1 g by mouth 4 (four) times daily -  with meals and at bedtime.    [provider]  Turmeric 500 MG CAPS Take 500 mg by mouth daily. 12/12/20 03/12/21  Milinda Pointer, MD    Allergies Patient has  no known allergies.  Family History  Problem Relation Age of Onset   Pulmonary embolism Mother    Sudden death Mother    Obesity Mother    Heart failure Father    Congestive Heart Failure Father    Sudden death Father    Breast cancer Neg Hx     Social History Social History   Tobacco Use   Smoking status: Never   Smokeless tobacco: Never  Substance Use Topics   Alcohol use: No    Alcohol/week: 0.0 standard drinks   Drug use: No    Review of Systems  Constitutional: Negative for fever. Eyes: Negative for visual changes. ENT: Negative for sore throat. Neck: No neck pain  Cardiovascular: Negative for chest pain. Respiratory: + shortness of breath. Gastrointestinal: Negative for abdominal pain, vomiting or diarrhea. Genitourinary: Negative for dysuria. Musculoskeletal: Negative for back pain. Skin: Negative for rash. Neurological: Negative for headaches, weakness or numbness. Psych: No SI or  HI  ____________________________________________   PHYSICAL EXAM:  VITAL SIGNS: ED Triage Vitals  Enc Vitals Group     BP 02/13/21 1958 112/76     Pulse Rate 02/13/21 1958 77     Resp 02/13/21 1958 (!) 22     Temp 02/13/21 1958 97.8 F (36.6 C)     Temp Source 02/13/21 1958 Oral     SpO2 02/13/21 1958 93 %     Weight 02/13/21 1958 252 lb (114.3 kg)     Height 02/13/21 1958 5\' 4"  (1.626 m)     Head Circumference --      Peak Flow --      Pain Score 02/13/21 1958 2     Pain Loc --      Pain Edu? --      Excl. in [place]? --     Constitutional: Alert and oriented. Well appearing and in no apparent distress. HEENT:      Head: Normocephalic and atraumatic.         Eyes: Conjunctivae are normal. Sclera is non-icteric.       Mouth/Throat: Mucous membranes are moist.       Neck: Supple with no signs of meningismus. Cardiovascular: Regular rate and rhythm. No murmurs, gallops, or rubs. 2+ symmetrical distal pulses are present in all extremities. No JVD. Respiratory: Normal respiratory effort. Lungs are clear to auscultation bilaterally.  Gastrointestinal: Soft, non tender, and non distended with positive bowel sounds. No rebound or guarding. Genitourinary: No CVA tenderness. Musculoskeletal:  No edema, cyanosis, or erythema of extremities. Neurologic: Normal speech and language. Face is symmetric. Moving all extremities. No gross focal neurologic deficits are appreciated. Skin: Skin is warm, dry and intact. No rash noted. Psychiatric: Mood and affect are normal. Speech and behavior are normal.  ____________________________________________   LABS (all labs ordered are listed, but only abnormal results are displayed)  Labs Reviewed  CBC - Abnormal; Notable for the following components:      Result Value   WBC 20.0 (*)    All other components within normal limits  BASIC METABOLIC PANEL - Abnormal; Notable for the following components:   Glucose, Bld 112 (*)    BUN 28 (*)     Creatinine, Ser 1.15 (*)    GFR, Estimated 48 (*)    All other components within normal limits  RESP PANEL BY RT-PCR (FLU A&B, COVID) ARPGX2  TROPONIN I (HIGH SENSITIVITY)  TROPONIN I (HIGH SENSITIVITY)   ____________________________________________  EKG  ED ECG REPORT I, Rudene Re, the  attending physician, personally viewed and interpreted this ECG.  Sinus rhythm with a rate of 79, normal intervals, normal axis, no ST elevations or depressions, T wave inversions in inferior leads.  Unchanged compared to prior ____________________________________________  RADIOLOGY  I have personally reviewed the images performed during this visit and I agree with the Radiologist's read.   Interpretation by Radiologist:  DG Chest 2 View  Result Date: 02/13/2021 CLINICAL DATA:  Shortness of breath. EXAM: CHEST - 2 VIEW COMPARISON:  None. FINDINGS: There is no evidence of acute infiltrate, pleural effusion or pneumothorax. The heart size and mediastinal contours are within normal limits. A radiopaque fusion plate and screws are seen overlying the cervical spine. Multilevel degenerative changes are noted throughout the thoracic spine. IMPRESSION: No active cardiopulmonary disease. Electronically Signed   By: Virgina Norfolk M.D.   On: 02/13/2021 20:46   CT Angio Chest PE W and/or Wo Contrast  Result Date: 02/13/2021 CLINICAL DATA:  Chest pain or SOB, pleurisy or effusion suspected EXAM: CT ANGIOGRAPHY CHEST WITH CONTRAST TECHNIQUE: Multidetector CT imaging of the chest was performed using the standard protocol during bolus administration of intravenous contrast. Multiplanar CT image reconstructions and MIPs were obtained to evaluate the vascular anatomy. CONTRAST:  2mL OMNIPAQUE IOHEXOL 350 MG/ML SOLN COMPARISON:  None. FINDINGS: Cardiovascular: Satisfactory opacification of the pulmonary arteries to the proximal segmental level. However, there is streak artifact from residual intravenous  contrast and respiratory motion artifact that limits evaluation. No evidence of pulmonary embolism. Normal heart size. No pericardial effusion. Coronary artery calcification. Thoracic aorta is normal in caliber with minor calcified plaque. Mediastinum/Nodes: No enlarged lymph nodes. Esophagus is unremarkable. Visualized thyroid is unremarkable. Lungs/Pleura: Respiratory motion. Subsegmental atelectasis at the lung bases. No consolidation. No pleural effusion or pneumothorax. Upper Abdomen: No acute abnormality. Musculoskeletal: Degenerative changes of the thoracic spine. Degenerative changes at the glenohumeral joints. Partially imaged lower cervical anterior fusion. No acute osseous abnormality. Review of the MIP images confirms the above findings. IMPRESSION: No central acute pulmonary embolism. There is adequate contrast bolus timing, but respiratory motion artifact limits evaluation particularly beyond the lobar level. No other acute abnormality. Electronically Signed   By: Macy Mis M.D.   On: 02/13/2021 20:56     ____________________________________________   PROCEDURES  Procedure(s) performed:yes .1-3 Lead EKG Interpretation Performed by: Rudene Re, MD Authorized by: Rudene Re, MD     Interpretation: non-specific     ECG rate assessment: normal     Rhythm: sinus rhythm     Ectopy: none     Conduction: normal     Critical Care performed: CRITICAL CARE Performed by: Rudene Re  ?  Total critical care time: 30 min  Critical care time was exclusive of separately billable procedures and treating other patients.  Critical care was necessary to treat or prevent imminent or life-threatening deterioration.  Critical care was time spent personally by me on the following activities: development of treatment plan with patient and/or surrogate as well as nursing, discussions with consultants, evaluation of patient's response to treatment, examination of patient,  obtaining history from patient or surrogate, ordering and performing treatments and interventions, ordering and review of laboratory studies, ordering and review of radiographic studies, pulse oximetry and re-evaluation of patient's condition.  ____________________________________________   INITIAL IMPRESSION / ASSESSMENT AND PLAN / ED COURSE  80 y.o. female with a history of CLL, chronic back pain, hypertension, hyperlipidemia, PE on Eliquis who presents for evaluation of shortness of breath.  Patient presents with sudden  onset of shortness of breath, chest tightness radiating through the back.  She is in no obvious distress with normal work of breathing normal sats, lungs are clear to auscultation with good air movement, no wheezing or crackles.  She looks euvolemic.  EKG with no signs of ischemia.  2 high-sensitivity troponins with no signs of demand ischemia.  She does have an elevated white count but that is normal for her due to her history of CLL.  Normal metabolic panel.  COVID and flu negative.  CT of the chest with no evidence of pneumonia or PE although limited due to some respiratory artifact therefore distal PE is not fully ruled out at this time.  Patient is on Eliquis and has not missed a dose.  Denies ever having a breakthrough PE after being placed on Eliquis.  We will give breathing treatment for possible bronchospasm and reassess . Old medical records reviewed.  Patient placed on telemetry for close monitoring of cardiorespiratory status.  _________________________ 6:47 AM on 02/14/2021 ----------------------------------------- After 1 breathing treatment patient reports no changes in her mild shortness of breath.  Again she reports that she feels that her difficulty breathing is coming from her upper back pain since she has not taken anything for her back for almost 24 hours.  Patient sats are normal, ambulatory sats are normal, lungs are clear to auscultation.  I did discuss  admission since she continues to complain of shortness of breath and also for pain control of her back but patient is adamant that she wants to go home.  She has been here for 11 hours and wishes to go home at this time.  She has an appointment with her PCP today.  She promised to return if she is feeling worse.  With negative work-up and no hypoxia we will discharge her home with the strict return precautions.     _____________________________________________ Please note:  Patient was evaluated in Emergency Department today for the symptoms described in the history of present illness. Patient was evaluated in the context of the global COVID-19 pandemic, which necessitated consideration that the patient might be at risk for infection with the SARS-CoV-2 virus that causes COVID-19. Institutional protocols and algorithms that pertain to the evaluation of patients at risk for COVID-19 are in a state of rapid change based on information released by regulatory bodies including the CDC and federal and state organizations. These policies and algorithms were followed during the patient's care in the ED.  Some ED evaluations and interventions may be delayed as a result of limited staffing during the pandemic.   [place] Controlled Substance Database was reviewed by me. ____________________________________________   FINAL CLINICAL IMPRESSION(S) / ED DIAGNOSES   Final diagnoses:  SOB (shortness of breath)      NEW MEDICATIONS STARTED DURING THIS VISIT:  ED Discharge Orders     None        Note:  This document was prepared using Dragon voice recognition software and may include unintentional dictation errors.    Rudene Re, MD 02/14/21 [phone]

## 2021-02-14 NOTE — ED Notes (Signed)
Son contacted to arrange transport for pt

## 2021-02-14 NOTE — ED Notes (Signed)
Pt sitting in subwait in recliner; c/o SHOB; pt hyperventilating and anxious; calms significantly with instructions to slow breathing; vss; will continue to monitor

## 2021-02-21 DIAGNOSIS — G4733 Obstructive sleep apnea (adult) (pediatric): Secondary | ICD-10-CM | POA: Diagnosis not present

## 2021-02-26 ENCOUNTER — Encounter (INDEPENDENT_AMBULATORY_CARE_PROVIDER_SITE_OTHER): Payer: Self-pay | Admitting: Family Medicine

## 2021-02-26 ENCOUNTER — Ambulatory Visit (INDEPENDENT_AMBULATORY_CARE_PROVIDER_SITE_OTHER): Payer: Medicare HMO | Admitting: Family Medicine

## 2021-02-26 ENCOUNTER — Other Ambulatory Visit: Payer: Self-pay

## 2021-02-26 VITALS — BP 124/83 | HR 70 | Temp 97.4°F | Ht 65.0 in | Wt 247.0 lb

## 2021-02-26 DIAGNOSIS — Z6841 Body Mass Index (BMI) 40.0 and over, adult: Secondary | ICD-10-CM | POA: Diagnosis not present

## 2021-02-26 DIAGNOSIS — I10 Essential (primary) hypertension: Secondary | ICD-10-CM | POA: Diagnosis not present

## 2021-02-26 DIAGNOSIS — E7849 Other hyperlipidemia: Secondary | ICD-10-CM | POA: Diagnosis not present

## 2021-02-27 NOTE — Progress Notes (Signed)
Chief Complaint:   OBESITY Jamie Phillips is here to discuss her progress with her obesity treatment plan along with follow-up of her obesity related diagnoses. Jamie Phillips is on the Category 1 Plan and states she is following her eating plan approximately 50% of the time. Jamie Phillips states she is not currently exercising.  Today's visit was #: 4 Starting weight: 257 lbs Starting date: 12/26/2020 Today's weight: 247 lbs Today's date: 02/26/2021 Total lbs lost to date: 10 Total lbs lost since last in-office visit: 3  Interim History: Food wise, Jamie Phillips recalls she isn't eating as much as meal plan calls for. Lunch is a sandwich with chicken breast, lettuce, and tomato. Breakfast is occasionally 2 eggs with toast. She drinks a protein shake between meals (muscle milk). Dinner is normally salad and chicken strips (may eat more protein at dinner). She craves chocolate. Salad is boiled egg, chicken, and lettuce. She is hoping to cook for Thanksgiving. Pt is noticing she is not satiated with dinner so she is eating yogurt with dinner.  Subjective:   1. Essential hypertension BP controlled today. Pt denies chest pain/chest pressure/headache.  2. Other hyperlipidemia Jamie Phillips is on Pravachol and denies transaminitis.   Assessment/Plan:   1. Essential hypertension Jamie Phillips is working on healthy weight loss and exercise to improve blood pressure control. We will watch for signs of hypotension as she continues her lifestyle modifications. Continue current meds with no change in dose.  2. Other hyperlipidemia Cardiovascular risk and specific lipid/LDL goals reviewed.  We discussed several lifestyle modifications today and Jamie Phillips will continue to work on diet, exercise and weight loss efforts. Orders and follow up as documented in patient record. Continue current treatment plan.  Counseling Intensive lifestyle modifications are the first line treatment for this issue. Dietary changes: Increase soluble fiber. Decrease simple  carbohydrates. Exercise changes: Moderate to vigorous-intensity aerobic activity 150 minutes per week if tolerated. Lipid-lowering medications: see documented in medical record.  3. Obesity, current BMI is 41.2  Jamie Phillips is currently in the action stage of change. As such, her goal is to continue with weight loss efforts. She has agreed to the Category 2 Plan.   Exercise goals: No exercise has been prescribed at this time.  Behavioral modification strategies: increasing lean protein intake, meal planning and cooking strategies, and keeping healthy foods in the home.  Jamie Phillips has agreed to follow-up with our clinic in 2 weeks. She was informed of the importance of frequent follow-up visits to maximize her success with intensive lifestyle modifications for her multiple health conditions.   Objective:   Blood pressure 124/83, pulse 70, temperature (!) 97.4 F (36.3 C), height 5\' 5"  (1.651 m), weight 247 lb (112 kg), SpO2 93 %. Body mass index is 41.1 kg/m.  General: Cooperative, alert, well developed, in no acute distress. HEENT: Conjunctivae and lids unremarkable. Cardiovascular: Regular rhythm.  Lungs: Normal work of breathing. Neurologic: No focal deficits.   Lab Results  Component Value Date   CREATININE 1.15 (H) 02/13/2021   BUN 28 (H) 02/13/2021   NA 140 02/13/2021   K 4.1 02/13/2021   CL 104 02/13/2021   CO2 28 02/13/2021   Lab Results  Component Value Date   ALT 13 12/26/2020   AST 18 12/26/2020   ALKPHOS 96 12/26/2020   BILITOT 0.3 12/26/2020   Lab Results  Component Value Date   HGBA1C 5.8 (H) 12/26/2020   Lab Results  Component Value Date   INSULIN 14.4 12/26/2020   Lab Results  Component  Value Date   TSH 3.590 12/26/2020   Lab Results  Component Value Date   CHOL 240 (H) 12/26/2020   HDL 59 12/26/2020   LDLCALC 145 (H) 12/26/2020   TRIG 202 (H) 12/26/2020   Lab Results  Component Value Date   VD25OH 28.9 (L) 12/26/2020   Lab Results  Component  Value Date   WBC 20.0 (H) 02/13/2021   HGB 13.9 02/13/2021   HCT 41.0 02/13/2021   MCV 98.3 02/13/2021   PLT 280 02/13/2021   Lab Results  Component Value Date   FERRITIN 86 12/18/2018    Attestation Statements:   Reviewed by clinician on day of visit: allergies, medications, problem list, medical history, surgical history, family history, social history, and previous encounter notes.  Coral Ceo, CMA, am acting as transcriptionist for Coralie Common, MD.   I have reviewed the above documentation for accuracy and completeness, and I agree with the above. - Coralie Common, MD

## 2021-03-03 DIAGNOSIS — G4733 Obstructive sleep apnea (adult) (pediatric): Secondary | ICD-10-CM | POA: Diagnosis not present

## 2021-03-06 DIAGNOSIS — H353221 Exudative age-related macular degeneration, left eye, with active choroidal neovascularization: Secondary | ICD-10-CM | POA: Diagnosis not present

## 2021-03-13 ENCOUNTER — Encounter (INDEPENDENT_AMBULATORY_CARE_PROVIDER_SITE_OTHER): Payer: Self-pay | Admitting: Family Medicine

## 2021-03-13 ENCOUNTER — Ambulatory Visit (INDEPENDENT_AMBULATORY_CARE_PROVIDER_SITE_OTHER): Payer: Medicare HMO | Admitting: Family Medicine

## 2021-03-13 ENCOUNTER — Other Ambulatory Visit: Payer: Self-pay

## 2021-03-13 VITALS — BP 131/79 | HR 65 | Temp 97.4°F | Ht 65.0 in | Wt 246.0 lb

## 2021-03-13 DIAGNOSIS — G4733 Obstructive sleep apnea (adult) (pediatric): Secondary | ICD-10-CM

## 2021-03-13 DIAGNOSIS — Z9989 Dependence on other enabling machines and devices: Secondary | ICD-10-CM

## 2021-03-13 DIAGNOSIS — F3289 Other specified depressive episodes: Secondary | ICD-10-CM

## 2021-03-13 DIAGNOSIS — Z6841 Body Mass Index (BMI) 40.0 and over, adult: Secondary | ICD-10-CM | POA: Diagnosis not present

## 2021-03-19 ENCOUNTER — Encounter (INDEPENDENT_AMBULATORY_CARE_PROVIDER_SITE_OTHER): Payer: Self-pay | Admitting: Family Medicine

## 2021-03-19 NOTE — Progress Notes (Signed)
Chief Complaint:   OBESITY Jamie Phillips is here to discuss her progress with her obesity treatment plan along with follow-up of her obesity related diagnoses. Jamie Phillips is on the Category 2 Plan and states she is following her eating plan approximately 90% of the time. Jamie Phillips states she is walking for 5 minutes 7 times per week.  Today's visit was #: 5 Starting weight: 257 lbs Starting date: 12/26/2020 Today's weight: 246 lbs Today's date: 03/13/2021 Total lbs lost to date: 11 Total lbs lost since last in-office visit: 1  Interim History: Jamie Phillips says she needs to lose weight in order to have treatment for back pain. She is in constant pain, and she does not feel our program is helping her. However, she has lost 11 lbs since 12/26/2020. She does not eat all of the protein at lunch. It is hard for her to cook food for the plan due to back pain. Her son is supportive and she says he will help her cook if needed.  Subjective:   1. Other depression Jamie Phillips notes she feels it would be better if she did not wake up sometimes. However, she says she would never kill herself because it is a "sin". She is depressed about chronic pain. Denies plan for suicide.  2. OSA on CPAP Jamie Phillips does not always wear her CPAP when she is in pain.  Assessment/Plan:   1. Other depression  Jamie Phillips will continue escitalopram and Duloxetine.  Encouragement provided. I feel assured that she does not plan on suicide.   2. OSA on CPAP  Jamie Phillips is to try to wear her CPAP as much as possible. She will continue to work on diet, exercise and weight loss efforts. We will continue to monitor. Orders and follow up as documented in patient record.   3. Obesity, current BMI is 40.94 Jamie Phillips is currently in the action stage of change. As such, her goal is to continue with weight loss efforts. She has agreed to the Category 2 Plan.   Exercise goals: As is.  Behavioral modification strategies: increasing lean protein intake and meal planning and  cooking strategies.  Jamie Phillips has agreed to follow-up with our clinic in 2 weeks.  Objective:   Blood pressure 131/79, pulse 65, temperature (!) 97.4 F (36.3 C), height 5\' 5"  (1.651 m), weight 246 lb (111.6 kg), SpO2 92 %. Body mass index is 40.94 kg/m.  General: Cooperative, alert, well developed, in no acute distress. HEENT: Conjunctivae and lids unremarkable. Cardiovascular: Regular rhythm.  Lungs: Normal work of breathing. Neurologic: No focal deficits.   Lab Results  Component Value Date   CREATININE 1.15 (H) 02/13/2021   BUN 28 (H) 02/13/2021   NA 140 02/13/2021   K 4.1 02/13/2021   CL 104 02/13/2021   CO2 28 02/13/2021   Lab Results  Component Value Date   ALT 13 12/26/2020   AST 18 12/26/2020   ALKPHOS 96 12/26/2020   BILITOT 0.3 12/26/2020   Lab Results  Component Value Date   HGBA1C 5.8 (H) 12/26/2020   Lab Results  Component Value Date   INSULIN 14.4 12/26/2020   Lab Results  Component Value Date   TSH 3.590 12/26/2020   Lab Results  Component Value Date   CHOL 240 (H) 12/26/2020   HDL 59 12/26/2020   LDLCALC 145 (H) 12/26/2020   TRIG 202 (H) 12/26/2020   Lab Results  Component Value Date   VD25OH 28.9 (L) 12/26/2020   Lab Results  Component Value Date  WBC 20.0 (H) 02/13/2021   HGB 13.9 02/13/2021   HCT 41.0 02/13/2021   MCV 98.3 02/13/2021   PLT 280 02/13/2021   Lab Results  Component Value Date   FERRITIN 86 12/18/2018   Attestation Statements:   Reviewed by clinician on day of visit: allergies, medications, problem list, medical history, surgical history, family history, social history, and previous encounter notes.  Time spent on visit including pre-visit chart review and post-visit care and charting was 32 minutes.    Wilhemena Durie, am acting as Location manager for [employer], FNP-C.  I have reviewed the above documentation for accuracy and completeness, and I agree with the above. -  Georgianne Fick, FNP

## 2021-03-30 ENCOUNTER — Inpatient Hospital Stay: Payer: Medicare HMO | Attending: Nurse Practitioner

## 2021-03-30 ENCOUNTER — Inpatient Hospital Stay (HOSPITAL_BASED_OUTPATIENT_CLINIC_OR_DEPARTMENT_OTHER): Payer: Medicare HMO | Admitting: Nurse Practitioner

## 2021-03-30 ENCOUNTER — Encounter: Payer: Self-pay | Admitting: Nurse Practitioner

## 2021-03-30 ENCOUNTER — Other Ambulatory Visit: Payer: Self-pay

## 2021-03-30 VITALS — BP 130/69 | HR 59 | Temp 97.4°F | Resp 20 | Wt 252.9 lb

## 2021-03-30 DIAGNOSIS — Z86718 Personal history of other venous thrombosis and embolism: Secondary | ICD-10-CM | POA: Diagnosis not present

## 2021-03-30 DIAGNOSIS — I129 Hypertensive chronic kidney disease with stage 1 through stage 4 chronic kidney disease, or unspecified chronic kidney disease: Secondary | ICD-10-CM | POA: Diagnosis not present

## 2021-03-30 DIAGNOSIS — R531 Weakness: Secondary | ICD-10-CM | POA: Insufficient documentation

## 2021-03-30 DIAGNOSIS — R5383 Other fatigue: Secondary | ICD-10-CM | POA: Diagnosis not present

## 2021-03-30 DIAGNOSIS — Z86711 Personal history of pulmonary embolism: Secondary | ICD-10-CM | POA: Diagnosis not present

## 2021-03-30 DIAGNOSIS — G4730 Sleep apnea, unspecified: Secondary | ICD-10-CM | POA: Insufficient documentation

## 2021-03-30 DIAGNOSIS — C9110 Chronic lymphocytic leukemia of B-cell type not having achieved remission: Secondary | ICD-10-CM

## 2021-03-30 DIAGNOSIS — E669 Obesity, unspecified: Secondary | ICD-10-CM | POA: Insufficient documentation

## 2021-03-30 DIAGNOSIS — I739 Peripheral vascular disease, unspecified: Secondary | ICD-10-CM | POA: Insufficient documentation

## 2021-03-30 DIAGNOSIS — M129 Arthropathy, unspecified: Secondary | ICD-10-CM | POA: Diagnosis not present

## 2021-03-30 DIAGNOSIS — C9111 Chronic lymphocytic leukemia of B-cell type in remission: Secondary | ICD-10-CM | POA: Diagnosis not present

## 2021-03-30 DIAGNOSIS — Z79899 Other long term (current) drug therapy: Secondary | ICD-10-CM | POA: Diagnosis not present

## 2021-03-30 DIAGNOSIS — E7800 Pure hypercholesterolemia, unspecified: Secondary | ICD-10-CM | POA: Diagnosis not present

## 2021-03-30 DIAGNOSIS — M545 Low back pain, unspecified: Secondary | ICD-10-CM | POA: Diagnosis not present

## 2021-03-30 DIAGNOSIS — K219 Gastro-esophageal reflux disease without esophagitis: Secondary | ICD-10-CM | POA: Diagnosis not present

## 2021-03-30 DIAGNOSIS — Z7901 Long term (current) use of anticoagulants: Secondary | ICD-10-CM | POA: Diagnosis not present

## 2021-03-30 DIAGNOSIS — D472 Monoclonal gammopathy: Secondary | ICD-10-CM

## 2021-03-30 LAB — CBC WITH DIFFERENTIAL/PLATELET
Abs Immature Granulocytes: 0.03 10*3/uL (ref 0.00–0.07)
Basophils Absolute: 0.1 10*3/uL (ref 0.0–0.1)
Basophils Relative: 1 %
Eosinophils Absolute: 0.8 10*3/uL — ABNORMAL HIGH (ref 0.0–0.5)
Eosinophils Relative: 5 %
HCT: 40.5 % (ref 36.0–46.0)
Hemoglobin: 13.1 g/dL (ref 12.0–15.0)
Immature Granulocytes: 0 %
Lymphocytes Relative: 62 %
Lymphs Abs: 9.3 10*3/uL — ABNORMAL HIGH (ref 0.7–4.0)
MCH: 32.5 pg (ref 26.0–34.0)
MCHC: 32.3 g/dL (ref 30.0–36.0)
MCV: 100.5 fL — ABNORMAL HIGH (ref 80.0–100.0)
Monocytes Absolute: 0.9 10*3/uL (ref 0.1–1.0)
Monocytes Relative: 6 %
Neutro Abs: 3.9 10*3/uL (ref 1.7–7.7)
Neutrophils Relative %: 26 %
Platelets: 253 10*3/uL (ref 150–400)
RBC: 4.03 MIL/uL (ref 3.87–5.11)
RDW: 14.3 % (ref 11.5–15.5)
WBC: 15 10*3/uL — ABNORMAL HIGH (ref 4.0–10.5)
nRBC: 0 % (ref 0.0–0.2)

## 2021-03-30 LAB — COMPREHENSIVE METABOLIC PANEL
ALT: 17 U/L (ref 0–44)
AST: 19 U/L (ref 15–41)
Albumin: 3.8 g/dL (ref 3.5–5.0)
Alkaline Phosphatase: 81 U/L (ref 38–126)
Anion gap: 9 (ref 5–15)
BUN: 24 mg/dL — ABNORMAL HIGH (ref 8–23)
CO2: 30 mmol/L (ref 22–32)
Calcium: 9 mg/dL (ref 8.9–10.3)
Chloride: 101 mmol/L (ref 98–111)
Creatinine, Ser: 0.98 mg/dL (ref 0.44–1.00)
GFR, Estimated: 59 mL/min — ABNORMAL LOW (ref >60)
Glucose, Bld: 113 mg/dL — ABNORMAL HIGH (ref 70–99)
Potassium: 3.3 mmol/L — ABNORMAL LOW (ref 3.5–5.1)
Sodium: 140 mmol/L (ref 135–145)
Total Bilirubin: 0.4 mg/dL (ref 0.3–1.2)
Total Protein: 6.3 g/dL — ABNORMAL LOW (ref 6.5–8.1)

## 2021-03-30 LAB — LACTATE DEHYDROGENASE: LDH: 164 U/L (ref 98–192)

## 2021-03-30 NOTE — Progress Notes (Signed)
[place] OFFICE PROGRESS NOTE  Patient Care Team: Kirk Ruths, MD as PCP - General (Internal Medicine) Bary Castilla, Forest Gleason, MD (General Surgery) Meeler, Sherren Kerns, FNP as Referring Physician (Family Medicine); Dr.Harold Jefm Bryant;    SUMMARY OF ONCOLOGIC HISTORY:  Oncology History Overview Note  # April 2017- Bil Massive Pulmonary embolus/R LE DVT- s/p Thrombolysis [Dr.Schnier, [facility]]-Eliquis; NEG factor V leide/ Prothrombin gene. S/p IV Filter explantation.    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  2) CD56 expression on monocytes.   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  --------------------------------------------------------  DIAGNOSIS: CLL  STAGE: I        ;GOALS: COntrol  CURRENT/MOST RECENT THERAPY: surveillaince    CLL (chronic lymphocytic leukemia) (HCC)     INTERVAL HISTORY: Patient is a pleasant 80 year old female with above history of CLL, bilateral PE, and MGUS who returns to clinic for labs and routine follow-up.  CLL is on surveillance.  She continues Eliquis.  She continues to have chronic back and joint pain and reports poor control of her symptoms with current regimen.  She is followed by pain management.  Is being seen for weight loss before she could consider having surgery.  Rates pain 8 of 10 today.  No night sweats, fevers, chills.  She has 2 nodules on her scalp.  Chronic fatigue.  Reports poor sleep.   Review of Systems  Constitutional:  Positive for malaise/fatigue. Negative for chills, diaphoresis, fever and weight loss.  HENT:  Negative  for nosebleeds and sore throat.   Eyes:  Negative for double vision.  Respiratory:  Negative for cough, hemoptysis, sputum production, shortness of breath and wheezing.   Cardiovascular:  Negative for chest pain, palpitations, orthopnea and leg swelling.  Gastrointestinal:  Negative for abdominal pain, blood in stool, constipation, diarrhea, heartburn, melena, nausea and vomiting.  Genitourinary:  Negative for dysuria, frequency and urgency.  Musculoskeletal:  Positive for back pain, joint pain and neck pain. Negative for falls and myalgias.  Skin: Negative.  Negative for itching and rash.  Neurological:  Negative for dizziness, tingling, focal weakness, weakness and headaches.  Endo/Heme/Allergies:  Does not bruise/bleed easily.  Psychiatric/Behavioral:  Negative for depression. The patient is not nervous/anxious and does not have insomnia.    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)    Macular degeneration    MGUS (monoclonal gammopathy of unknown significance)    Morbid obesity (HCC)    Obesity    Pulmonary embolism (HCC)    PVD (peripheral vascular disease) (HCC)    Sleep apnea  PAST SURGICAL HISTORY :   Past Surgical History:  Procedure Laterality Date   APPENDECTOMY     BREAST BIOPSY Left 08/19/04   lt bx/clip-neg   COLONOSCOPY     COLONOSCOPY WITH PROPOFOL N/A 03/10/2017   Procedure: COLONOSCOPY WITH PROPOFOL;  Surgeon: [name], MD;  Location: [facility] ENDOSCOPY;  Service: Endoscopy;  Laterality: N/A;    ESOPHAGOGASTRODUODENOSCOPY (EGD) WITH PROPOFOL N/A 02/02/2019   Procedure: ESOPHAGOGASTRODUODENOSCOPY (EGD) WITH PROPOFOL;  Surgeon: Toledo, Benay Pike, MD;  Location: [facility] ENDOSCOPY;  Service: Endoscopy;  Laterality: N/A;   IVC FILTER INSERTION N/A 06/11/2016   Procedure: IVC Filter Insertion;  Surgeon: Katha Cabal, MD;  Location: [facility] CV LAB;  Service: Cardiovascular;  Laterality: N/A;   IVC FILTER REMOVAL N/A 05/06/2017   Procedure: IVC FILTER REMOVAL;  Surgeon: Katha Cabal, MD;  Location: [place] LAB;  Service: Cardiovascular;  Laterality: N/A;   JOINT REPLACEMENT Left 06/17/2016   LAPAROSCOPIC APPENDECTOMY N/A 10/10/2018   Procedure: APPENDECTOMY LAPAROSCOPIC ATTEMPTED CONVERTED TO OPEN;  Surgeon: Herbert Pun, MD;  Location: [facility] ORS;  Service: General;  Laterality: N/A;   LAPAROSCOPY N/A 10/10/2018   Procedure: LAPAROSCOPY DIAGNOSTIC ATTEMPTED CONVERTED TO OPEN;  Surgeon: Herbert Pun, MD;  Location: [facility] ORS;  Service: General;  Laterality: N/A;   neck fusion     PARTIAL COLECTOMY Right 10/10/2018   Procedure: PARTIAL COLECTOMY;  Surgeon: Herbert Pun, MD;  Location: [facility] ORS;  Service: General;  Laterality: Right;   PERIPHERAL VASCULAR CATHETERIZATION N/A 08/15/2015   Procedure: pulmonary angiogram with lysis;  Surgeon: Katha Cabal, MD;  Location: [facility];  Service: Cardiovascular;  Laterality: N/A;   WRIST SURGERY Left 2000    FAMILY HISTORY :   Family History  Problem Relation Age of Onset   Pulmonary embolism Mother    Sudden death Mother    Obesity Mother    Heart failure Father    Congestive Heart Failure Father    Sudden death Father    Breast cancer Neg Hx     SOCIAL HISTORY:   Social History   Tobacco Use   Smoking status: Never   Smokeless tobacco: Never  Substance Use Topics   Alcohol use: No    Alcohol/week: 0.0 standard drinks   Drug use: No    ALLERGIES:  has No Known  Allergies.  MEDICATIONS:  Current Outpatient Medications  Medication Sig Dispense Refill   acetaminophen (TYLENOL) 650 MG CR tablet Take 650-1,300 mg by mouth every 8 (eight) hours as needed for pain. TYLENOL ARTHRITIS     calcium carbonate (OSCAL) 1500 (600 Ca) MG TABS tablet Take 1 tablet (1,500 mg total) by mouth 2 (two) times daily with a meal. 60 tablet 2   cholestyramine (QUESTRAN) 4 g packet Take 4 g by mouth 3 (three) times daily with meals.     DULoxetine (CYMBALTA) 60 MG capsule Take 60 mg by mouth daily.     ELIQUIS 5 MG TABS tablet TAKE 1 TABLET TWICE DAILY (Patient taking differently: Take 5 mg by mouth 2 (two) times daily.) 180 tablet 6   escitalopram (LEXAPRO) 10 MG tablet Take 10 mg by mouth daily.     gabapentin (NEURONTIN) 400 MG capsule Take 1 capsule (400 mg total) by mouth 3 (three) times daily. 90 capsule    losartan-hydrochlorothiazide (HYZAAR) 100-12.5 MG tablet Take 1 tablet by mouth daily. Reported on 08/22/2015     magnesium gluconate (MAGONATE) 500 MG tablet Take 500 mg by mouth 2 (  two) times daily.     Multiple Vitamins-Minerals (PRESERVISION AREDS 2 PO) Take 1 tablet by mouth in the morning and at bedtime.     oxyCODONE (OXY IR/ROXICODONE) 5 MG immediate release tablet Take 1 tablet (5 mg total) by mouth 3 (three) times daily as needed for severe pain. Must last 30 days. 65 tablet 0   pantoprazole (PROTONIX) 40 MG tablet Take 1 tablet (40 mg total) by mouth 2 (two) times daily for 14 days. (Patient taking differently: Take 40 mg by mouth daily.) 28 tablet 0   pravastatin (PRAVACHOL) 40 MG tablet Take 40 mg by mouth daily.      propranolol (INDERAL) 40 MG tablet Take 40 mg by mouth daily.     sucralfate (CARAFATE) 1 g tablet Take 1 g by mouth 4 (four) times daily -  with meals and at bedtime.     oxyCODONE (OXY IR/ROXICODONE) 5 MG immediate release tablet Take 1 tablet (5 mg total) by mouth 3 (three) times daily as needed for severe pain. Must last 30 days. (Patient not  taking: Reported on 03/30/2021) 65 tablet 0   [START ON 04/16/2021] oxyCODONE (OXY IR/ROXICODONE) 5 MG immediate release tablet Take 1 tablet (5 mg total) by mouth 3 (three) times daily as needed for severe pain. Must last 30 days. (Patient not taking: Reported on 03/30/2021) 65 tablet 0   No current facility-administered medications for this visit.    PHYSICAL EXAMINATION: BP 130/69   Pulse (!) 59   Temp (!) 97.4 F (36.3 C)   Resp 20   Wt 252 lb 14.4 oz (114.7 kg)   SpO2 94%   BMI 42.08 kg/m   Filed Weights   03/30/21 0900  Weight: 252 lb 14.4 oz (114.7 kg)    Physical Exam Constitutional:      Appearance: She is obese. She is not ill-appearing.     Comments: 4 wheel rolling walker  Eyes:     General: No scleral icterus.    Conjunctiva/sclera: Conjunctivae normal.  Cardiovascular:     Rate and Rhythm: Normal rate and regular rhythm.  Abdominal:     General: There is no distension.     Palpations: Abdomen is soft.     Tenderness: There is no abdominal tenderness. There is no guarding.  Lymphadenopathy:     Cervical: No cervical adenopathy.  Skin:    General: Skin is warm and dry.  Neurological:     Mental Status: She is alert and oriented to person, place, and time. Mental status is at baseline.  Psychiatric:        Mood and Affect: Mood normal.        Behavior: Behavior normal.     LABORATORY DATA:  I have reviewed the data as listed    Component Value Date/Time   NA 140 03/30/2021 0836   NA 142 12/26/2020 1112   NA 141 04/29/2012 1822   K 3.3 (L) 03/30/2021 0836   K 3.9 04/29/2012 1822   CL 101 03/30/2021 0836   CL 107 04/29/2012 1822   CO2 30 03/30/2021 0836   CO2 28 04/29/2012 1822   GLUCOSE 113 (H) 03/30/2021 0836   GLUCOSE 129 (H) 04/29/2012 1822   BUN 24 (H) 03/30/2021 0836   BUN 15 12/26/2020 1112   BUN 21 (H) 04/29/2012 1822   CREATININE 0.98 03/30/2021 0836   CREATININE 1.02 (H) 07/28/2014 0950   CALCIUM 9.0 03/30/2021 0836   CALCIUM 8.7  04/29/2012 1822   PROT 6.3 (L)  03/30/2021 0836   PROT 5.8 (L) 12/26/2020 1112   PROT 7.1 04/29/2012 1822   ALBUMIN 3.8 03/30/2021 0836   ALBUMIN 4.0 12/26/2020 1112   ALBUMIN 3.3 (L) 04/29/2012 1822   AST 19 03/30/2021 0836   AST 20 04/29/2012 1822   ALT 17 03/30/2021 0836   ALT 33 04/29/2012 1822   ALKPHOS 81 03/30/2021 0836   ALKPHOS 169 (H) 04/29/2012 1822   BILITOT 0.4 03/30/2021 0836   BILITOT 0.3 12/26/2020 1112   BILITOT 0.3 04/29/2012 1822   GFRNONAA 59 (L) 03/30/2021 0836   GFRNONAA 55 (L) 07/28/2014 0950   GFRAA 55 (L) 09/03/2019 0830   GFRAA >60 07/28/2014 0950    No results found for: SPEP, UPEP  Lab Results  Component Value Date   WBC 15.0 (H) 03/30/2021   NEUTROABS 3.9 03/30/2021   HGB 13.1 03/30/2021   HCT 40.5 03/30/2021   MCV 100.5 (H) 03/30/2021   PLT 253 03/30/2021      Chemistry      Component Value Date/Time   NA 140 03/30/2021 0836   NA 142 12/26/2020 1112   NA 141 04/29/2012 1822   K 3.3 (L) 03/30/2021 0836   K 3.9 04/29/2012 1822   CL 101 03/30/2021 0836   CL 107 04/29/2012 1822   CO2 30 03/30/2021 0836   CO2 28 04/29/2012 1822   BUN 24 (H) 03/30/2021 0836   BUN 15 12/26/2020 1112   BUN 21 (H) 04/29/2012 1822   CREATININE 0.98 03/30/2021 0836   CREATININE 1.02 (H) 07/28/2014 0950      Component Value Date/Time   CALCIUM 9.0 03/30/2021 0836   CALCIUM 8.7 04/29/2012 1822   ALKPHOS 81 03/30/2021 0836   ALKPHOS 169 (H) 04/29/2012 1822   AST 19 03/30/2021 0836   AST 20 04/29/2012 1822   ALT 17 03/30/2021 0836   ALT 33 04/29/2012 1822   BILITOT 0.4 03/30/2021 0836   BILITOT 0.3 12/26/2020 1112   BILITOT 0.3 04/29/2012 1822        ASSESSMENT & PLAN:   No problem-specific Assessment & Plan notes found for this encounter.   Massive bilateral pulmonary embolus- on indefinite anticoagulation. Continue eliquis. Tolerating well.  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  Absolute lymphocyte count is stable at 9.3.  Clinically asymptomatic.  Monitor for now. 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  Interestingly May 2020 was negative.  Labs pending today.  Clinically no evidence of progression and no evidence of crab criteria.  Monitor. Chronic joint and back pain-managed by pain clinic and neurosurgery. Fatigue-multifactorial.  Monitor.  DISPOSITION: 6 months labs (CBC, CMP, LDH, multiple myeloma panel, kappa lambda light chains), Dr. Shanda Bumps, NP 03/30/2021

## 2021-03-30 NOTE — Progress Notes (Signed)
Pt states that she is in constant pain even with 5mg  of oxycodone dealing with sciatic nerve. States she has spoken to the pain management doctor but does not prescribe anything else that can help her. States that oxycodone does nothing for her. Gabapentin helps some but it does make her very sleepy. Pt seems very depressed due to believing no one is listening to her in regards to her pain.

## 2021-04-02 LAB — KAPPA/LAMBDA LIGHT CHAINS
Kappa free light chain: 38.3 mg/L — ABNORMAL HIGH (ref 3.3–19.4)
Kappa, lambda light chain ratio: 4.85 — ABNORMAL HIGH (ref 0.26–1.65)
Lambda free light chains: 7.9 mg/L (ref 5.7–26.3)

## 2021-04-03 ENCOUNTER — Encounter (INDEPENDENT_AMBULATORY_CARE_PROVIDER_SITE_OTHER): Payer: Self-pay | Admitting: Family Medicine

## 2021-04-03 ENCOUNTER — Ambulatory Visit (INDEPENDENT_AMBULATORY_CARE_PROVIDER_SITE_OTHER): Payer: Medicare HMO | Admitting: Family Medicine

## 2021-04-03 ENCOUNTER — Other Ambulatory Visit: Payer: Self-pay

## 2021-04-03 VITALS — BP 115/75 | HR 59 | Temp 97.7°F | Ht 65.0 in | Wt 248.0 lb

## 2021-04-03 DIAGNOSIS — M5137 Other intervertebral disc degeneration, lumbosacral region: Secondary | ICD-10-CM

## 2021-04-03 DIAGNOSIS — I10 Essential (primary) hypertension: Secondary | ICD-10-CM

## 2021-04-03 NOTE — Progress Notes (Signed)
Chief Complaint:   OBESITY Jamie Phillips is here to discuss her progress with her obesity treatment plan along with follow-up of her obesity related diagnoses. Jamie Phillips is on the Category 2 Plan and states she is following her eating plan approximately 75% of the time. Jamie Phillips states she is not currently exercising.  Today's visit was #: 6 Starting weight: 257 lbs Starting date: 12/26/2020 Today's weight: 248 lbs Today's date: 04/03/2021 Total lbs lost to date: 9 Total lbs lost since last in-office visit: 0  Interim History: Jamie Phillips had a great Thanksgiving. She hosted her family and some of her granddaughter's friends. Pt tried to get back on category 2 after the holiday. She is able to get 2 meals in daily. Pt may do a protein bar or shake in the place of actual meal. She will be getting together with family for Christmas. She is craving ice cream.  Subjective:   1. Essential hypertension BP well controlled. Pt denies chest pain/chest pressure/headache.  2. DDD (degenerative disc disease), lumbosacral Paislea see's pain management but unfortunately pain is significantly under-treated. She is upset due to obesity prejudice.  Assessment/Plan:   1. Essential hypertension Jamie Phillips is working on healthy weight loss and exercise to improve blood pressure control. We will watch for signs of hypotension as she continues her lifestyle modifications. Continue current meds with no change in meds or dose.  2. DDD (degenerative disc disease), lumbosacral Refer to Dr. Hardin Negus at [facility].  3. Obesity with current BMI of 41.3  Jamie Phillips is currently in the action stage of change. As such, her goal is to continue with weight loss efforts. She has agreed to the Category 2 Plan.   Exercise goals: No exercise has been prescribed at this time.  Behavioral modification strategies: increasing lean protein intake, meal planning and cooking strategies, and keeping healthy foods in the home.  Jamie Phillips has agreed  to follow-up with our clinic in 3-4 weeks. She was informed of the importance of frequent follow-up visits to maximize her success with intensive lifestyle modifications for her multiple health conditions.   Objective:   Blood pressure 115/75, pulse (!) 59, temperature 97.7 F (36.5 C), height 5\' 5"  (1.651 m), weight 248 lb (112.5 kg), SpO2 94 %. Body mass index is 41.27 kg/m.  General: Cooperative, alert, well developed, in no acute distress. HEENT: Conjunctivae and lids unremarkable. Cardiovascular: Regular rhythm.  Lungs: Normal work of breathing. Neurologic: No focal deficits.   Lab Results  Component Value Date   CREATININE 0.98 03/30/2021   BUN 24 (H) 03/30/2021   NA 140 03/30/2021   K 3.3 (L) 03/30/2021   CL 101 03/30/2021   CO2 30 03/30/2021   Lab Results  Component Value Date   ALT 17 03/30/2021   AST 19 03/30/2021   ALKPHOS 81 03/30/2021   BILITOT 0.4 03/30/2021   Lab Results  Component Value Date   HGBA1C 5.8 (H) 12/26/2020   Lab Results  Component Value Date   INSULIN 14.4 12/26/2020   Lab Results  Component Value Date   TSH 3.590 12/26/2020   Lab Results  Component Value Date   CHOL 240 (H) 12/26/2020   HDL 59 12/26/2020   LDLCALC 145 (H) 12/26/2020   TRIG 202 (H) 12/26/2020   Lab Results  Component Value Date   VD25OH 28.9 (L) 12/26/2020   Lab Results  Component Value Date   WBC 15.0 (H) 03/30/2021   HGB 13.1 03/30/2021   HCT 40.5 03/30/2021  MCV 100.5 (H) 03/30/2021   PLT 253 03/30/2021   Lab Results  Component Value Date   FERRITIN 86 12/18/2018   Attestation Statements:   Reviewed by clinician on day of visit: allergies, medications, problem list, medical history, surgical history, family history, social history, and previous encounter notes.  Coral Ceo, CMA, am acting as transcriptionist for Coralie Common, MD.  I have reviewed the above documentation for accuracy and completeness, and I agree with the above. -  Coralie Common, MD

## 2021-04-04 LAB — MULTIPLE MYELOMA PANEL, SERUM
Albumin SerPl Elph-Mcnc: 3.4 g/dL (ref 2.9–4.4)
Albumin/Glob SerPl: 1.4 (ref 0.7–1.7)
Alpha 1: 0.2 g/dL (ref 0.0–0.4)
Alpha2 Glob SerPl Elph-Mcnc: 0.8 g/dL (ref 0.4–1.0)
B-Globulin SerPl Elph-Mcnc: 1.1 g/dL (ref 0.7–1.3)
Gamma Glob SerPl Elph-Mcnc: 0.3 g/dL — ABNORMAL LOW (ref 0.4–1.8)
Globulin, Total: 2.5 g/dL (ref 2.2–3.9)
IgA: 276 mg/dL (ref 64–422)
IgG (Immunoglobin G), Serum: 423 mg/dL — ABNORMAL LOW (ref 586–1602)
IgM (Immunoglobulin M), Srm: 66 mg/dL (ref 26–217)
M Protein SerPl Elph-Mcnc: 0.2 g/dL — ABNORMAL HIGH
Total Protein ELP: 5.9 g/dL — ABNORMAL LOW (ref 6.0–8.5)

## 2021-04-26 ENCOUNTER — Encounter: Payer: Self-pay | Admitting: Physical Medicine and Rehabilitation

## 2021-04-30 ENCOUNTER — Ambulatory Visit (INDEPENDENT_AMBULATORY_CARE_PROVIDER_SITE_OTHER): Payer: Medicare HMO | Admitting: Family Medicine

## 2021-04-30 ENCOUNTER — Encounter (INDEPENDENT_AMBULATORY_CARE_PROVIDER_SITE_OTHER): Payer: Self-pay | Admitting: Family Medicine

## 2021-04-30 ENCOUNTER — Other Ambulatory Visit: Payer: Self-pay

## 2021-04-30 VITALS — BP 126/75 | HR 61 | Temp 98.0°F | Ht 65.0 in | Wt 246.0 lb

## 2021-04-30 DIAGNOSIS — Z6841 Body Mass Index (BMI) 40.0 and over, adult: Secondary | ICD-10-CM

## 2021-04-30 DIAGNOSIS — I10 Essential (primary) hypertension: Secondary | ICD-10-CM

## 2021-04-30 DIAGNOSIS — E7849 Other hyperlipidemia: Secondary | ICD-10-CM | POA: Diagnosis not present

## 2021-04-30 MED ORDER — CHOLESTYRAMINE 4 G PO PACK: 4.0000 g | PACK | Freq: Three times a day (TID) | ORAL | 0 refills | Status: AC

## 2021-05-01 NOTE — Progress Notes (Signed)
Chief Complaint:   OBESITY Jamie Phillips is here to discuss her progress with her obesity treatment plan along with follow-up of her obesity related diagnoses. Jamie Phillips is on the Category 2 Plan and states she is following her eating plan approximately 75% of the time. Jamie Phillips states she is walking 5 minutes ? times per week.  Today's visit was #: 7 Starting weight: 257 lbs Starting date: 12/26/2020 Today's weight: 246 lbs Today's date: 04/30/2021 Total lbs lost to date: 11 Total lbs lost since last in-office visit: 2  Interim History: Pt thinks pain management referral called her last week. She had a great holiday season- visited all her children, cooked a meal, and enjoyed herself. Pt has been visiting her daughter in the hospital and rehab. She is wondering how to increase protein with stew beef as it's her favorite.  Subjective:   1. Other hyperlipidemia Jamie Phillips is on cholestyramine. Her last lipid panel revealed a total of 240, LDL of 145, HDL 59, and triglycerides 202.  2. Essential hypertension BP is not elevated today. Pt denies chest pain/chest pressure/headache.  Assessment/Plan:   1. Other hyperlipidemia Not at goal yet. Cardiovascular risk and specific lipid/LDL goals reviewed.  We discussed several lifestyle modifications today and Jamie Phillips will continue to work on diet, exercise and weight loss efforts. Orders and follow up as documented in patient record.   Counseling Intensive lifestyle modifications are the first line treatment for this issue. Dietary changes: Increase soluble fiber. Decrease simple carbohydrates. Exercise changes: Moderate to vigorous-intensity aerobic activity 150 minutes per week if tolerated. Lipid-lowering medications: see documented in medical record.  Refill- cholestyramine (QUESTRAN) 4 g packet; Take 1 packet (4 g total) by mouth 3 (three) times daily with meals.  Dispense: 60 each; Refill: 0  2. Essential hypertension [name] is working on healthy weight loss  and exercise to improve blood pressure control. We will watch for signs of hypotension as she continues her lifestyle modifications. Continue current treatment plan.  3. Obesity with current BMI of 41.0  Jamie Phillips is currently in the action stage of change. As such, her goal is to continue with weight loss efforts. She has agreed to the Category 2 Plan.   Exercise goals: All adults should avoid inactivity. Some physical activity is better than none, and adults who participate in any amount of physical activity gain some health benefits. As tolerated.  Behavioral modification strategies: increasing lean protein intake, meal planning and cooking strategies, and keeping healthy foods in the home.  Jamie Phillips has agreed to follow-up with our clinic in 3-4 weeks. She was informed of the importance of frequent follow-up visits to maximize her success with intensive lifestyle modifications for her multiple health conditions.   Objective:   Blood pressure 126/75, pulse 61, temperature 98 F (36.7 C), height 5\' 5"  (1.651 m), weight 246 lb (111.6 kg), SpO2 94 %. Body mass index is 40.94 kg/m.  General: Cooperative, alert, well developed, in no acute distress. HEENT: Conjunctivae and lids unremarkable. Cardiovascular: Regular rhythm.  Lungs: Normal work of breathing. Neurologic: No focal deficits.   Lab Results  Component Value Date   CREATININE 0.98 03/30/2021   BUN 24 (H) 03/30/2021   NA 140 03/30/2021   K 3.3 (L) 03/30/2021   CL 101 03/30/2021   CO2 30 03/30/2021   Lab Results  Component Value Date   ALT 17 03/30/2021   AST 19 03/30/2021   ALKPHOS 81 03/30/2021   BILITOT 0.4 03/30/2021   Lab Results  Component  Value Date   HGBA1C 5.8 (H) 12/26/2020   Lab Results  Component Value Date   INSULIN 14.4 12/26/2020   Lab Results  Component Value Date   TSH 3.590 12/26/2020   Lab Results  Component Value Date   CHOL 240 (H) 12/26/2020   HDL 59 12/26/2020   LDLCALC 145 (H) 12/26/2020    TRIG 202 (H) 12/26/2020   Lab Results  Component Value Date   VD25OH 28.9 (L) 12/26/2020   Lab Results  Component Value Date   WBC 15.0 (H) 03/30/2021   HGB 13.1 03/30/2021   HCT 40.5 03/30/2021   MCV 100.5 (H) 03/30/2021   PLT 253 03/30/2021   Lab Results  Component Value Date   FERRITIN 86 12/18/2018    Attestation Statements:   Reviewed by clinician on day of visit: allergies, medications, problem list, medical history, surgical history, family history, social history, and previous encounter notes.  Coral Ceo, CMA, am acting as transcriptionist for Coralie Common, MD.   I have reviewed the above documentation for accuracy and completeness, and I agree with the above. - Coralie Common, MD

## 2021-05-13 NOTE — Progress Notes (Signed)
PROVIDER NOTE: Information contained herein reflects review and annotations entered in association with encounter. Interpretation of such information and data should be left to medically-trained personnel. Information provided to patient can be located elsewhere in the medical record under "Patient Instructions". Document created using STT-dictation technology, any transcriptional errors that may result from process are unintentional.    Patient: Jamie Phillips  Service Category: E/M  Provider: Gaspar Cola, MD  DOB: 1940/12/07  DOS: 05/14/2021  Specialty: Interventional Pain Management  MRN: [id number]  Setting: Ambulatory outpatient  PCP: Kirk Ruths, MD  Type: Established Patient    Referring Provider: Kirk Ruths, MD  Location: Office  Delivery: Face-to-face     HPI  Jamie Phillips, a 81 y.o. year old female, is here today because of her Chronic pain syndrome [G89.4]. Jamie Phillips primary complain today is Back Pain (Low right) Last encounter: My last encounter with her was on 02/07/2021. Pertinent problems: Jamie Phillips has Pain in the chest; CLL (chronic lymphocytic leukemia) ([place]); DJD (degenerative joint disease); Chronic lower extremity pain (1ry area of Pain) (Bilateral); Right lower quadrant pain; DDD (degenerative disc disease), lumbosacral; Generalized OA; History of Bell's palsy; Pes anserine bursitis; RLS (restless legs syndrome); Walker as ambulation aid; Chronic pain syndrome; Chronic low back pain (Bilateral) w/ sciatica (Bilateral); Lumbosacral radiculopathy at L1 (Bilateral); Lumbosacral radiculopathy at L5 (Left); Lumbosacral radiculopathy at S1 (Right); 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); Abnormal MRI, lumbar spine (01/24/2020); Lumbar facet syndrome (Multilevel) (Bilateral); Lumbar facet hypertrophy (Multilevel) (Bilateral); Severe muscle deconditioning; Lower extremity weakness (Bilateral); Lumbosacral facet hypertrophy  (Multilevel) (Bilateral); Lumbar scoliosis (idiopathic); Spondylolisthesis of lumbosacral region (Multilevel); Retrolisthesis (L1/L2); Anterolisthesis of lumbar spine (L3/L4, L4/L5); Anterolisthesis of lumbosacral spine (L5/S1); Lumbar lateral recess stenosis (Bilateral: L3-4, L4-5) (Severe); Lumbar foraminal stenosis (Right: L4-5); Synovial cyst of lumbar facet joint (L3-4) (Right); Arthropathy of spinal facet joint concurrent with and due to effusion (L3-4); Annular tear of lumbar disc (L3-4) (Left); Chronic lower extremity radicular pain (L1/L2 dermatomes) (Bilateral); Chronic groin pain (Bilateral); Neurogenic pain, lower extremity (Bilateral); Shooting pain, intermittent (electrical-like); History of total knee replacement (Bilateral) ; Spondylosis without myelopathy or radiculopathy, lumbosacral region; Chronic low back pain (2ry area of Pain) (Bilateral) w/o sciatica; Abnormal EMG (electromyogram) (05/09/2020); Abnormal CT scan, lumbar spine (06/16/2020); Chronic hip pain (Bilateral); Trochanteric bursitis of hips (Bilateral); Iliopsoas bursitis of hips; Decreased range of motion of lumbar spine; Osteoarthritis of hips (Bilateral); Sacroiliac joint dysfunction of right side; Chronic right sacroiliac joint pain; and 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HCC) on their pertinent problem list. Pain Assessment: Severity of Chronic pain is reported as a 8 /10. Location: Back Lower, Right/radiates down the back of right leg to last 3 toes. Onset: More than a month ago. Quality: Sharp, Radiating, Pins and needles. Timing: Constant. Modifying factor(s): nothing. Vitals:  height is '5\' 4"'  (1.626 m) and weight is 246 lb (111.6 kg). Her temperature is 97.2 F (36.2 C) (abnormal). Her blood pressure is 122/72 and her pulse is 62. Her oxygen saturation is 97%.   Reason for encounter: medication management.   The patient indicates doing well with the current medication regimen. No adverse reactions or side effects  reported to the medications.   The patient indicates that she is having more pain than usual and it is going down both lower extremities.  On the right side is going down to the bottom of the foot and what appears to be an S1 dermatomal distribution and on the left  side it is going to the top of the foot and big toe and what appears to be an L5 dermatomal distribution.  She has documented evidence of multilevel spondylolisthesis, central spinal stenosis, lateral recess stenosis, and foraminal stenosis.  In addition, she is morbidly obese and this does not help her with her pain.  RTCB: 08/14/2021 Nonopioids transferred: Failed pending (written by Dr. Ouida Sills)  Pharmacotherapy Assessment  Analgesic: Tramadol 50 mg, 1 tab p.o. 4 times daily (200 mg/day of tramadol) MME/day: 20 mg/day   Monitoring: [place] PMP: PDMP reviewed during this encounter.       Pharmacotherapy: No side-effects or adverse reactions reported. Compliance: No problems identified. Effectiveness: Clinically acceptable.  Dewayne Shorter, RN  05/14/2021 11:49 AM  Sign when Signing Visit Nursing Pain Medication Assessment:  Safety precautions to be maintained throughout the outpatient stay will include: orient to surroundings, keep bed in low position, maintain call bell within reach at all times, provide assistance with transfer out of bed and ambulation.  Medication Inspection Compliance: Pill count conducted under aseptic conditions, in front of the patient. Neither the pills nor the bottle was removed from the patient's sight at any time. Once count was completed pills were immediately returned to the patient in their original bottle.  Medication: Oxycodone IR Pill/Patch Count:  14 of 65 pills remain Pill/Patch Appearance: Markings consistent with prescribed medication Bottle Appearance: Standard pharmacy container. Clearly labeled. Filled Date: 13 / 29 / 2022 Last Medication intake:  Today    UDS:  Summary  Date Value Ref  Range Status  07/27/2020 Note  Final    Comment:    ==================================================================== [employer] [facility] 13 (MW) ==================================================================== Test                             Result       Flag       Units  Drug Present and Declared for Prescription Verification   Oxycodone                      1206         EXPECTED   ng/mg creat   Oxymorphone                    90           EXPECTED   ng/mg creat   Noroxycodone                   2368         EXPECTED   ng/mg creat    Sources of oxycodone include scheduled prescription medications.    Oxymorphone and noroxycodone are expected metabolites of oxycodone.    Oxymorphone is also available as a scheduled prescription medication.  ==================================================================== Test                      Result    Flag   Units      Ref Range   Creatinine              212              mg/dL      >=20 ==================================================================== Declared Medications:  The flagging and interpretation on this report are based on the  following declared medications.  Unexpected results may arise from  inaccuracies in the declared medications.   **Note: The testing scope of this panel includes these  medications:   Oxycodone   **Note: The testing scope of this panel does not include the  following reported medications:   Acetaminophen (Tylenol)  Albuterol (Ventolin HFA)  Apixaban (Eliquis)  Cholestyramine (Questran)  Duloxetine (Cymbalta)  Escitalopram (Lexapro)  Gabapentin (Neurontin)  Hydrochlorothiazide (Hyzaar)  Losartan (Hyzaar)  Pantoprazole (Protonix)  Pravastatin (Pravachol)  Sucralfate (Carafate) ==================================================================== For clinical consultation, please call [phone]. ====================================================================      ROS   Constitutional: Denies any fever or chills Gastrointestinal: No reported hemesis, hematochezia, vomiting, or acute GI distress Musculoskeletal: Denies any acute onset joint swelling, redness, loss of ROM, or weakness Neurological: No reported episodes of acute onset apraxia, aphasia, dysarthria, agnosia, amnesia, paralysis, loss of coordination, or loss of consciousness  Medication Review  DULoxetine, Multiple Vitamins-Minerals, acetaminophen, apixaban, calcium carbonate, cholestyramine, escitalopram, gabapentin, losartan-hydrochlorothiazide, magnesium gluconate, oxyCODONE, pantoprazole, pravastatin, propranolol, and sucralfate  History Review  Allergy: Jamie Phillips has No Known Allergies. Drug: Jamie Phillips  reports no history of drug use. Alcohol:  reports no history of alcohol use. Tobacco:  reports that she has never smoked. She has never used smokeless tobacco. Social: Jamie Phillips  reports that she has never smoked. She has never used smokeless tobacco. She reports that she does not drink alcohol and does not use drugs. 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 Surgical: Jamie Phillips  has a past surgical history that includes neck fusion; Wrist surgery (Left, 2000); Cardiac catheterization (N/A, 08/15/2015); IVC FILTER INSERTION (N/A, 06/11/2016); Colonoscopy; Joint replacement (Left, 06/17/2016); Colonoscopy with propofol (N/A, 03/10/2017); IVC FILTER REMOVAL (N/A,  05/06/2017); laparoscopy (N/A, 10/10/2018); laparoscopic appendectomy (N/A, 10/10/2018); Partial colectomy (Right, 10/10/2018); Appendectomy; Esophagogastroduodenoscopy (egd) with propofol (N/A, 02/02/2019); and Breast biopsy (Left, 08/19/04). Family: family history includes Congestive Heart Failure in her father; Heart failure in her father; Obesity in her mother; Pulmonary embolism in her mother; Sudden death in her father and mother.  Laboratory Chemistry Profile   Renal Lab Results  Component Value Date   BUN 24 (H) 03/30/2021   CREATININE 0.98 03/30/2021   BCR 17 12/26/2020   GFRAA 55 (L) 09/03/2019   GFRNONAA 59 (L) 03/30/2021    Hepatic Lab Results  Component Value Date   AST 19 03/30/2021   ALT 17 03/30/2021   ALBUMIN 3.8 03/30/2021   ALKPHOS 81 03/30/2021   LIPASE 37 10/09/2018    Electrolytes Lab Results  Component Value Date   NA 140 03/30/2021   K 3.3 (L) 03/30/2021   CL 101 03/30/2021   CALCIUM 9.0 03/30/2021   MG 1.9 06/21/2020    Bone Lab Results  Component Value Date   VD25OH 28.9 (L) 12/26/2020   25OHVITD1 14 (L) 06/21/2020   25OHVITD2 <1.0 06/21/2020   25OHVITD3 14 06/21/2020    Inflammation (CRP: Acute Phase) (ESR: Chronic Phase) Lab Results  Component Value Date   CRP 9 06/21/2020   ESRSEDRATE 21 06/21/2020   LATICACIDVEN 0.9 12/13/2018         Note: Above Lab results reviewed.  Recent Imaging Review  CT Angio Chest PE W and/or Wo Contrast CLINICAL DATA:  Chest pain or SOB, pleurisy or effusion suspected  EXAM:  CT ANGIOGRAPHY CHEST WITH CONTRAST  TECHNIQUE: Multidetector CT imaging of the chest was performed using the standard protocol during bolus administration of intravenous contrast. Multiplanar CT image reconstructions and MIPs were obtained to evaluate the vascular anatomy.  CONTRAST:  38m OMNIPAQUE IOHEXOL 350 MG/ML SOLN  COMPARISON:  None.  FINDINGS: Cardiovascular: Satisfactory opacification of the pulmonary arteries to the  proximal segmental level. However, there is streak artifact from residual intravenous contrast and respiratory motion artifact that limits evaluation. No evidence of pulmonary embolism. Normal heart size. No pericardial effusion. Coronary artery calcification. Thoracic aorta is normal in caliber with minor calcified plaque.  Mediastinum/Nodes: No enlarged lymph nodes. Esophagus is unremarkable. Visualized thyroid is unremarkable.  Lungs/Pleura: Respiratory motion. Subsegmental atelectasis at the lung bases. No consolidation. No pleural effusion or pneumothorax.  Upper Abdomen: No acute abnormality.  Musculoskeletal: Degenerative changes of the thoracic spine. Degenerative changes at the glenohumeral joints. Partially imaged lower cervical anterior fusion. No acute osseous abnormality.  Review of the MIP images confirms the above findings.  IMPRESSION: No central acute pulmonary embolism. There is adequate contrast bolus timing, but respiratory motion artifact limits evaluation particularly beyond the lobar level.  No other acute abnormality.  Electronically Signed   By: PMacy MisM.D.   On: 02/13/2021 20:56 DG Chest 2 View CLINICAL DATA:  Shortness of breath.  EXAM: CHEST - 2 VIEW  COMPARISON:  None.  FINDINGS: There is no evidence of acute infiltrate, pleural effusion or pneumothorax. The heart size and mediastinal contours are within normal limits. A radiopaque fusion plate and screws are seen overlying the cervical spine. Multilevel degenerative changes are noted throughout the thoracic spine.  IMPRESSION: No active cardiopulmonary disease.  Electronically Signed   By: TVirgina NorfolkM.D.   On: 02/13/2021 20:46 Note: Reviewed        Physical Exam  General appearance: Well nourished, well developed, and well hydrated. In no apparent acute distress Mental status: Alert, oriented x 3 (person, place, & time)       Respiratory: No evidence of acute  respiratory distress Eyes: PERLA Vitals: BP 122/72 (BP Location: Left Arm, Patient Position: Sitting, Cuff Size: Large)    Pulse 62    Temp (!) 97.2 F (36.2 C)    Ht '5\' 4"'  (1.626 m)    Wt 246 lb (111.6 kg)    SpO2 97%    BMI 42.23 kg/m  BMI: Estimated body mass index is 42.23 kg/m as calculated from the following:   Height as of this encounter: '5\' 4"'  (1.626 m).   Weight as of this encounter: 246 lb (111.6 kg). Ideal: Ideal body weight: 54.7 kg (120 lb 9.5 oz) Adjusted ideal body weight: 77.5 kg (170 lb 12.1 oz)  Assessment   Status Diagnosis  Controlled Controlled Controlled 1. Chronic pain syndrome   2. Chronic lower extremity pain (1ry area of Pain) (Bilateral)   3. Chronic low back pain (2ry area of Pain) (Bilateral) w/o sciatica   4. Lumbar facet syndrome (Multilevel) (Bilateral)   5. Chronic groin pain (Bilateral)   6. Chronic hip pain (Bilateral)   7. Pharmacologic therapy   8. Chronic use of opiate for therapeutic purpose   9. Encounter for medication management   10. Lumbosacral radiculopathy at L5 (Left)   11. Lumbosacral radiculopathy at S1 (Right)   12. Lumbar lateral recess stenosis (Bilateral: L3-4, L4-5) (Severe)   13. Lumbar foraminal stenosis (Right: L4-5)   14. Chronic anticoagulation (Eliquis)      Updated Problems: No  problems updated.  Plan of Care  Problem-specific:  No problem-specific Assessment & Plan notes found for this encounter.  Jamie Phillips has a current medication list which includes the following long-term medication(s): calcium carbonate, cholestyramine, duloxetine, eliquis, escitalopram, gabapentin, losartan-hydrochlorothiazide, [START ON 05/16/2021] oxycodone, [START ON 06/15/2021] oxycodone, [START ON 07/15/2021] oxycodone, and pantoprazole.  Pharmacotherapy (Medications Ordered): Meds ordered this encounter  Medications   oxyCODONE (OXY IR/ROXICODONE) 5 MG immediate release tablet    Sig: Take 1 tablet (5 mg total) by mouth 3  (three) times daily as needed for severe pain. Must last 30 days.    Dispense:  65 tablet    Refill:  0    DO NOT: delete (not duplicate); no partial-fill (will deny script to complete), no refill request (F/U required). DISPENSE: 1 day early if closed on fill date. WARN: No CNS-depressants within 8 hrs of med.   oxyCODONE (OXY IR/ROXICODONE) 5 MG immediate release tablet    Sig: Take 1 tablet (5 mg total) by mouth 3 (three) times daily as needed for severe pain. Must last 30 days.    Dispense:  65 tablet    Refill:  0    DO NOT: delete (not duplicate); no partial-fill (will deny script to complete), no refill request (F/U required). DISPENSE: 1 day early if closed on fill date. WARN: No CNS-depressants within 8 hrs of med.   oxyCODONE (OXY IR/ROXICODONE) 5 MG immediate release tablet    Sig: Take 1 tablet (5 mg total) by mouth 3 (three) times daily as needed for severe pain. Must last 30 days.    Dispense:  65 tablet    Refill:  0    DO NOT: delete (not duplicate); no partial-fill (will deny script to complete), no refill request (F/U required). DISPENSE: 1 day early if closed on fill date. WARN: No CNS-depressants within 8 hrs of med.   Orders:  Orders Placed This Encounter  Procedures   Lumbar Epidural Injection    Standing Status:   Future    Standing Expiration Date:   08/12/2021    Scheduling Instructions:     Procedure: Interlaminar Lumbar Epidural Steroid injection (LESI)  L4-5     Laterality: Right-sided     Sedation: Patient's choice.     Timeframe: ASAA    Order Specific Question:   Where will this procedure be performed?    Answer:   [facility]   Blood Thinner Instructions to Nursing    Always make sure patient has clearance from prescribing physician to stop blood thinners for interventional therapies. If the patient requires a Lovenox-bridge therapy, make sure arrangements are made to institute it with the assistance of the PCP.    Scheduling Instructions:      Have Jamie Phillips stop the Eliquis (Apixaban) x 3 days prior to procedure or surgery.   Follow-up plan:   Return for (Clinic) procedure: (R) L4-5 LESI #1, (Sed-anx), (Blood Thinner Protocol).     Interventional Therapies  Risk   Complexity Considerations:   Estimated body mass index is 43.6 kg/m as calculated from the following:   Height as of this encounter: '5\' 5"'  (1.651 m).   Weight as of this encounter: 262 lb (118.8 kg). NOTE: Eliquis Anticoagulation: (Stop: 3 days   Restart: 6 hrs)   Planned   Pending:   Therapeutic right L4-5 LESI #1    Under consideration:   Diagnostic bilateral iliopsoas bursa injection #1  Diagnostic midline T12-L1 LESI    Completed by  Sharlet Salina, DO:      Completed:   Diagnostic bilateral IA hip joint injection x1 (11/30/2020) (100/100/80/80) Diagnostic bilateral lumbar facet MBB x3 (01/04/2021) (100/50/0/70)  Diagnostic bilateral L3 TFESI x3 (11/07/2020) (100/100/100/100% relief of the bilateral L1/L2 radiculitis)  Diagnostic right L2-3 LESI x1 (07/11/2020) (100/100/100/100% relief of the bilateral L1/L2 radiculitis)    Therapeutic   Palliative (PRN) options:   Therapeutic right L2-3 LESI #2  Therapeutic bilateral L3 transforaminal ESI #4  Diagnostic bilateral lumbar facet MBB #3     Recent Visits No visits were found meeting these conditions. Showing recent visits within past 90 days and meeting all other requirements Today's Visits Date Type Provider Dept  05/14/21 Office Visit Milinda Pointer, MD [facility]  Showing today's visits and meeting all other requirements Future Appointments Date Type Provider Dept  07/30/21 Appointment Milinda Pointer, MD [facility]  Showing future appointments within next 90 days and meeting all other requirements  I discussed the assessment and treatment plan with the patient. The patient was provided an opportunity to ask questions and all were answered. The patient agreed with  the plan and demonstrated an understanding of the instructions.  Patient advised to call back or seek an in-person evaluation if the symptoms or condition worsens.  Duration of encounter: 30 minutes.  Note by: Gaspar Cola, MD Date: 05/14/2021; Time: 12:47 PM

## 2021-05-14 ENCOUNTER — Encounter: Payer: Self-pay | Admitting: Pain Medicine

## 2021-05-14 ENCOUNTER — Ambulatory Visit: Payer: Medicare HMO | Attending: Pain Medicine | Admitting: Pain Medicine

## 2021-05-14 ENCOUNTER — Other Ambulatory Visit: Payer: Self-pay

## 2021-05-14 VITALS — BP 122/72 | HR 62 | Temp 97.2°F | Ht 64.0 in | Wt 246.0 lb

## 2021-05-14 DIAGNOSIS — M79605 Pain in left leg: Secondary | ICD-10-CM | POA: Diagnosis not present

## 2021-05-14 DIAGNOSIS — M48061 Spinal stenosis, lumbar region without neurogenic claudication: Secondary | ICD-10-CM | POA: Insufficient documentation

## 2021-05-14 DIAGNOSIS — R1032 Left lower quadrant pain: Secondary | ICD-10-CM | POA: Diagnosis present

## 2021-05-14 DIAGNOSIS — Z79899 Other long term (current) drug therapy: Secondary | ICD-10-CM | POA: Diagnosis not present

## 2021-05-14 DIAGNOSIS — M5417 Radiculopathy, lumbosacral region: Secondary | ICD-10-CM | POA: Diagnosis not present

## 2021-05-14 DIAGNOSIS — M25551 Pain in right hip: Secondary | ICD-10-CM | POA: Diagnosis not present

## 2021-05-14 DIAGNOSIS — M47816 Spondylosis without myelopathy or radiculopathy, lumbar region: Secondary | ICD-10-CM | POA: Insufficient documentation

## 2021-05-14 DIAGNOSIS — M545 Low back pain, unspecified: Secondary | ICD-10-CM | POA: Insufficient documentation

## 2021-05-14 DIAGNOSIS — R1031 Right lower quadrant pain: Secondary | ICD-10-CM | POA: Insufficient documentation

## 2021-05-14 DIAGNOSIS — G894 Chronic pain syndrome: Secondary | ICD-10-CM | POA: Insufficient documentation

## 2021-05-14 DIAGNOSIS — G8929 Other chronic pain: Secondary | ICD-10-CM | POA: Diagnosis not present

## 2021-05-14 DIAGNOSIS — Z79891 Long term (current) use of opiate analgesic: Secondary | ICD-10-CM | POA: Insufficient documentation

## 2021-05-14 DIAGNOSIS — M79604 Pain in right leg: Secondary | ICD-10-CM | POA: Diagnosis not present

## 2021-05-14 DIAGNOSIS — Z7901 Long term (current) use of anticoagulants: Secondary | ICD-10-CM | POA: Insufficient documentation

## 2021-05-14 DIAGNOSIS — M25552 Pain in left hip: Secondary | ICD-10-CM | POA: Diagnosis present

## 2021-05-14 MED ORDER — OXYCODONE HCL 5 MG PO TABS: 5.0000 mg | ORAL_TABLET | Freq: Three times a day (TID) | ORAL | 0 refills | Status: DC | PRN

## 2021-05-14 NOTE — Progress Notes (Signed)
Nursing Pain Medication Assessment:  Safety precautions to be maintained throughout the outpatient stay will include: orient to surroundings, keep bed in low position, maintain call bell within reach at all times, provide assistance with transfer out of bed and ambulation.  Medication Inspection Compliance: Pill count conducted under aseptic conditions, in front of the patient. Neither the pills nor the bottle was removed from the patient's sight at any time. Once count was completed pills were immediately returned to the patient in their original bottle.  Medication: Oxycodone IR Pill/Patch Count:  14 of 65 pills remain Pill/Patch Appearance: Markings consistent with prescribed medication Bottle Appearance: Standard pharmacy container. Clearly labeled. Filled Date: 32 / 29 / 2022 Last Medication intake:  Today

## 2021-05-14 NOTE — Patient Instructions (Addendum)
____________________________________________________________________________________________ ° °Medication Rules ° °Purpose: To inform patients, and their family members, of our rules and regulations. ° °Applies to: All patients receiving prescriptions (written or electronic). ° °Pharmacy of record: Pharmacy where electronic prescriptions will be sent. If written prescriptions are taken to a different pharmacy, please inform the nursing staff. The pharmacy listed in the electronic medical record should be the one where you would like electronic prescriptions to be sent. ° °Electronic prescriptions: In compliance with the [place] [employer]rengthen Opioid Misuse Prevention (STOP) Act of 2017 (Session Law 2017-74/H243), effective April 22, 2018, all controlled substances must be electronically prescribed. Calling prescriptions to the pharmacy will cease to exist. ° °Prescription refills: Only during scheduled appointments. Applies to all prescriptions. ° °NOTE: The following applies primarily to controlled substances (Opioid* Pain Medications).  ° °Type of encounter (visit): For patients receiving controlled substances, face-to-face visits are required. (Not an option or up to the patient.) ° °Patient's responsibilities: °Pain Pills: Bring all pain pills to every appointment (except for procedure appointments). °Pill Bottles: Bring pills in original pharmacy bottle. Always bring the newest bottle. Bring bottle, even if empty. °Medication refills: You are responsible for knowing and keeping track of what medications you take and those you need refilled. °The day before your appointment: write a list of all prescriptions that need to be refilled. °The day of the appointment: give the list to the admitting nurse. Prescriptions will be written only during appointments. No prescriptions will be written on procedure days. °If you forget a medication: it will not be "Called in", "Faxed", or "electronically sent". You will  need to get another appointment to get these prescribed. °No early refills. Do not call asking to have your prescription filled early. °Prescription Accuracy: You are responsible for carefully inspecting your prescriptions before leaving our office. Have the discharge nurse carefully go over each prescription with you, before taking them home. Make sure that your name is accurately spelled, that your address is correct. Check the name and dose of your medication to make sure it is accurate. Check the number of pills, and the written instructions to make sure they are clear and accurate. Make sure that you are given enough medication to last until your next medication refill appointment. °Taking Medication: Take medication as prescribed. When it comes to controlled substances, taking less pills or less frequently than prescribed is permitted and encouraged. °Never take more pills than instructed. °Never take medication more frequently than prescribed.  °Inform other Doctors: Always inform, all of your healthcare providers, of all the medications you take. °Pain Medication from other Providers: You are not allowed to accept any additional pain medication from any other Doctor or Healthcare provider. There are two exceptions to this rule. (see below) In the event that you require additional pain medication, you are responsible for notifying us, as stated below. °Cough Medicine: Often these contain an opioid, such as codeine or hydrocodone. Never accept or take cough medicine containing these opioids if you are already taking an opioid* medication. The combination may cause respiratory failure and death. °Medication Agreement: You are responsible for carefully reading and following our Medication Agreement. This must be signed before receiving any prescriptions from our practice. Safely store a copy of your signed Agreement. Violations to the Agreement will result in no further prescriptions. (Additional copies of our  Medication Agreement are available upon request.) °Laws, Rules, & Regulations: All patients are expected to follow all Federal and State Laws, Statutes, Rules, & Regulations. Ignorance of   the Laws does not constitute a valid excuse.  °Illegal drugs and Controlled Substances: The use of illegal substances (including, but not limited to marijuana and its derivatives) and/or the illegal use of any controlled substances is strictly prohibited. Violation of this rule may result in the immediate and permanent discontinuation of any and all prescriptions being written by our practice. The use of any illegal substances is prohibited. °Adopted CDC guidelines & recommendations: Target dosing levels will be at or below 60 MME/day. Use of benzodiazepines** is not recommended. ° °Exceptions: There are only two exceptions to the rule of not receiving pain medications from other Healthcare Providers. °Exception #1 (Emergencies): In the event of an emergency (i.e.: accident requiring emergency care), you are allowed to receive additional pain medication. However, you are responsible for: As soon as you are able, call our office [phone], at any time of the day or night, and leave a message stating your name, the date and nature of the emergency, and the name and dose of the medication prescribed. In the event that your call is answered by a member of our staff, make sure to document and save the date, time, and the name of the person that took your information.  °Exception #2 (Planned Surgery): In the event that you are scheduled by another doctor or dentist to have any type of surgery or procedure, you are allowed (for a period no longer than 30 days), to receive additional pain medication, for the acute post-op pain. However, in this case, you are responsible for picking up a copy of our "Post-op Pain Management for Surgeons" handout, and giving it to your surgeon or dentist. This document is available at our office, and  does not require an appointment to obtain it. Simply go to our office during business hours (Monday-Thursday from 8:00 AM to 4:00 PM) (Friday 8:00 AM to 12:00 Noon) or if you have a scheduled appointment with us, prior to your surgery, and ask for it by name. In addition, you are responsible for: calling our office [phone], at any time of the day or night, and leaving a message stating your name, name of your surgeon, type of surgery, and date of procedure or surgery. Failure to comply with your responsibilities may result in termination of therapy involving the controlled substances. °Medication Agreement Violation. Following the above rules, including your responsibilities will help you in avoiding a Medication Agreement Violation (“Breaking your Pain Medication Contract”). ° °*Opioid medications include: morphine, codeine, oxycodone, oxymorphone, hydrocodone, hydromorphone, meperidine, tramadol, tapentadol, buprenorphine, fentanyl, methadone. °**Benzodiazepine medications include: diazepam (Valium), alprazolam (Xanax), clonazepam (Klonopine), lorazepam (Ativan), clorazepate (Tranxene), chlordiazepoxide (Librium), estazolam (Prosom), oxazepam (Serax), temazepam (Restoril), triazolam (Halcion) °(Last updated: 01/17/2021) °____________________________________________________________________________________________ ° ____________________________________________________________________________________________ ° °Medication Recommendations and Reminders ° °Applies to: All patients receiving prescriptions (written and/or electronic). ° °Medication Rules & Regulations: These rules and regulations exist for your safety and that of others. They are not flexible and neither are we. Dismissing or ignoring them will be considered "non-compliance" with medication therapy, resulting in complete and irreversible termination of such therapy. (See document titled "Medication Rules" for more details.) In all conscience,  because of safety reasons, we cannot continue providing a therapy where the patient does not follow instructions. ° °Pharmacy of record:  °Definition: This is the pharmacy where your electronic prescriptions will be sent.  °We do not endorse any particular pharmacy, however, we have experienced problems with [facility] not securing enough medication supply for the community. °We do not restrict you   in your choice of pharmacy. However, once we write for your prescriptions, we will NOT be re-sending more prescriptions to fix restricted supply problems created by your pharmacy, or your insurance.  °The pharmacy listed in the electronic medical record should be the one where you want electronic prescriptions to be sent. °If you choose to change pharmacy, simply notify our nursing staff. ° °Recommendations: °Keep all of your pain medications in a safe place, under lock and key, even if you live alone. We will NOT replace lost, stolen, or damaged medication. °After you fill your prescription, take 1 week's worth of pills and put them away in a safe place. You should keep a separate, properly labeled bottle for this purpose. The remainder should be kept in the original bottle. Use this as your primary supply, until it runs out. Once it's gone, then you know that you have 1 week's worth of medicine, and it is time to come in for a prescription refill. If you do this correctly, it is unlikely that you will ever run out of medicine. °To make sure that the above recommendation works, it is very important that you make sure your medication refill appointments are scheduled at least 1 week before you run out of medicine. To do this in an effective manner, make sure that you do not leave the office without scheduling your next medication management appointment. Always ask the nursing staff to show you in your prescription , when your medication will be running out. Then arrange for the receptionist to get you a return appointment,  at least 7 days before you run out of medicine. Do not wait until you have 1 or 2 pills left, to come in. This is very poor planning and does not take into consideration that we may need to cancel appointments due to bad weather, sickness, or emergencies affecting our staff. °DO NOT ACCEPT A "Partial Fill": If for any reason your pharmacy does not have enough pills/tablets to completely fill or refill your prescription, do not allow for a "partial fill". The law allows the pharmacy to complete that prescription within 72 hours, without requiring a new prescription. If they do not fill the rest of your prescription within those 72 hours, you will need a separate prescription to fill the remaining amount, which we will NOT provide. If the reason for the partial fill is your insurance, you will need to talk to the pharmacist about payment alternatives for the remaining tablets, but again, DO NOT ACCEPT A PARTIAL FILL, unless you can trust your pharmacist to obtain the remainder of the pills within 72 hours. ° °Prescription refills and/or changes in medication(s):  °Prescription refills, and/or changes in dose or medication, will be conducted only during scheduled medication management appointments. (Applies to both, written and electronic prescriptions.) °No refills on procedure days. No medication will be changed or started on procedure days. No changes, adjustments, and/or refills will be conducted on a procedure day. Doing so will interfere with the diagnostic portion of the procedure. °No phone refills. No medications will be "called into the pharmacy". °No Fax refills. °No weekend refills. °No Holliday refills. °No after hours refills. ° °Remember:  °Business hours are:  °Monday to Thursday 8:00 AM to 4:00 PM °Provider's Schedule: °Francisco Naveira, MD - Appointments are:  °Medication management: Monday and Wednesday 8:00 AM to 4:00 PM °Procedure day: Tuesday and Thursday 7:30 AM to 4:00 PM °Bilal Lateef, MD -  Appointments are:  °Medication management: Tuesday and Thursday 8:00   AM to 4:00 PM °Procedure day: Monday and Wednesday 7:30 AM to 4:00 PM °(Last update: 11/10/2019) °____________________________________________________________________________________________ ° ____________________________________________________________________________________________ ° °CBD (cannabidiol) & Delta-8 (Delta-8 tetrahydrocannabinol) WARNING ° °Intro: Cannabidiol (CBD) and tetrahydrocannabinol (THC), are two natural compounds found in plants of the Cannabis genus. They can both be extracted from hemp or cannabis. Hemp and cannabis come from the Cannabis sativa plant. Both compounds interact with your body’s endocannabinoid system, but they have very different effects. CBD does not produce the high sensation associated with cannabis. Delta-8 tetrahydrocannabinol, also known as delta-8 THC, is a psychoactive substance found in the Cannabis sativa plant, of which marijuana and hemp are two varieties. THC is responsible for the high associated with the illicit use of marijuana. ° °Applicable to: All individuals currently taking or considering taking CBD (cannabidiol) and, more important, all patients taking opioid analgesic controlled substances (pain medication). (Example: oxycodone; oxymorphone; hydrocodone; hydromorphone; morphine; methadone; tramadol; tapentadol; fentanyl; buprenorphine; butorphanol; dextromethorphan; meperidine; codeine; etc.) ° °Legal status: CBD remains a Schedule I drug prohibited for any use. CBD is illegal with one exception. In the [place], CBD has a limited Food and Drug Administration (FDA) approval for the treatment of two specific types of epilepsy disorders. Only one CBD product has been approved by the FDA for this purpose: "Epidiolex". FDA is aware that some companies are marketing products containing cannabis and cannabis-derived compounds in ways that violate the Federal Food, Drug and Cosmetic Act  (FD&C Act) and that may put the health and safety of consumers at risk. The FDA, a Federal agency, has not enforced the CBD status since 2018.  ° °Legality: Some manufacturers ship CBD products nationally, which is illegal. Often such products are sold online and are therefore available throughout the country. CBD is openly sold in head shops and health food stores in some states where such sales have not been explicitly legalized. Selling unapproved products with unsubstantiated therapeutic claims is not only a violation of the law, but also can put patients at risk, as these products have not been proven to be safe or effective. Federal illegality makes it difficult to conduct research on CBD. ° °Reference: "FDA Regulation of Cannabis and Cannabis-Derived Products, Including Cannabidiol (CBD)" - https://www.fda.gov/news-events/public-health-focus/fda-regulation-cannabis-and-cannabis-derived-products-including-cannabidiol-cbd ° °Warning: CBD is not FDA approved and has not undergo the same manufacturing controls as prescription drugs.  This means that the purity and safety of available CBD may be questionable. Most of the time, despite manufacturer's claims, it is contaminated with THC (delta-9-tetrahydrocannabinol - the chemical in marijuana responsible for the "HIGH").  When this is the case, the THC contaminant will trigger a positive urine drug screen (UDS) test for Marijuana (carboxy-THC). Because a positive UDS for any illicit substance is a violation of our medication agreement, your opioid analgesics (pain medicine) may be permanently discontinued. °The FDA recently put out a warning about 5 things that everyone should be aware of regarding Delta-8 THC: °Delta-8 THC products have not been evaluated or approved by the FDA for safe use and may be marketed in ways that put the public health at risk. °The FDA has received adverse event reports involving delta-8 THC-containing products. °Delta-8 THC has  psychoactive and intoxicating effects. °Delta-8 THC manufacturing often involve use of potentially harmful chemicals to create the concentrations of delta-8 THC claimed in the marketplace. The final delta-8 THC product may have potentially harmful by-products (contaminants) due to the chemicals used in the process. Manufacturing of delta-8 THC products may occur in uncontrolled or unsanitary settings, which may   lead to the presence of unsafe contaminants or other potentially harmful substances. °Delta-8 THC products should be kept out of the reach of children and pets. ° °MORE ABOUT CBD ° °General Information: CBD was discovered in 1940 and it is a derivative of the cannabis sativa genus plants (Marijuana and Hemp). It is one of the 113 identified substances found in Marijuana. It accounts for up to 40% of the plant's extract. As of 2018, preliminary clinical studies on CBD included research for the treatment of anxiety, movement disorders, and pain. CBD is available and consumed in multiple forms, including inhalation of smoke or vapor, as an aerosol spray, and by mouth. It may be supplied as an oil containing CBD, capsules, dried cannabis, or as a liquid solution. CBD is thought not to be as psychoactive as THC (delta-9-tetrahydrocannabinol - the chemical in marijuana responsible for the "HIGH"). Studies suggest that CBD may interact with different biological target receptors in the body, including cannabinoid and other neurotransmitter receptors. As of 2018 the mechanism of action for its biological effects has not been determined. ° °Side-effects   Adverse reactions: Dry mouth, diarrhea, decreased appetite, fatigue, drowsiness, malaise, weakness, sleep disturbances, and others. ° °Drug interactions: CBC may interact with other medications such as blood-thinners. °(Last update: 01/19/2021) °____________________________________________________________________________________________ °  ____________________________________________________________________________________________ ° °Drug Holidays (Slow) ° °What is a "Drug Holiday"? °Drug Holiday: is the name given to the period of time during which a patient stops taking a medication(s) for the purpose of eliminating tolerance to the drug. ° °Benefits °Improved effectiveness of opioids. °Decreased opioid dose needed to achieve benefits. °Improved pain with lesser dose. ° °What is tolerance? °Tolerance: is the progressive decreased in effectiveness of a drug due to its repetitive use. With repetitive use, the body gets use to the medication and as a consequence, it loses its effectiveness. This is a common problem seen with opioid pain medications. As a result, a larger dose of the drug is needed to achieve the same effect that used to be obtained with a smaller dose. ° °How long should a "Drug Holiday" last? °You should stay off of the pain medicine for at least 14 consecutive days. (2 weeks) ° °Should I stop the medicine "cold turkey"? °No. You should always coordinate with your Pain Specialist so that he/she can provide you with the correct medication dose to make the transition as smoothly as possible. ° °How do I stop the medicine? °Slowly. You will be instructed to decrease the daily amount of pills that you take by one (1) pill every seven (7) days. This is called a "slow downward taper" of your dose. For example: if you normally take four (4) pills per day, you will be asked to drop this dose to three (3) pills per day for seven (7) days, then to two (2) pills per day for seven (7) days, then to one (1) per day for seven (7) days, and at the end of those last seven (7) days, this is when the "Drug Holiday" would start.  ° °Will I have withdrawals? °By doing a "slow downward taper" like this one, it is unlikely that you will experience any significant withdrawal symptoms. Typically, what triggers withdrawals is the sudden stop of a high dose  opioid therapy. Withdrawals can usually be avoided by slowly decreasing the dose over a prolonged period of time. If you do not follow these instructions and decide to stop your medication abruptly, withdrawals may be possible. ° °What are withdrawals? °Withdrawals: refers   to the wide range of symptoms that occur after stopping or dramatically reducing opiate drugs after heavy and prolonged use. Withdrawal symptoms do not occur to patients that use low dose opioids, or those who take the medication sporadically. Contrary to benzodiazepine (example: Valium, Xanax, etc.) or alcohol withdrawals (Delirium Tremens), opioid withdrawals are not lethal. Withdrawals are the physical manifestation of the body getting rid of the excess receptors.  Expected Symptoms Early symptoms of withdrawal may include: Agitation Anxiety Muscle aches Increased tearing Insomnia Runny nose Sweating Yawning  Late symptoms of withdrawal may include: Abdominal cramping Diarrhea Dilated pupils Goose bumps Nausea Vomiting  Will I experience withdrawals? Due to the slow nature of the taper, it is very unlikely that you will experience any.  What is a slow taper? Taper: refers to the gradual decrease in dose.  (Last update: 11/10/2019) ____________________________________________________________________________________________   ______________________________________________________________________  Preparing for Procedure with Sedation  NOTICE: Due to recent regulatory changes, starting on November 20, 2020, procedures requiring intravenous (IV) sedation will no longer be performed at the [facility].  These types of procedures are required to be performed at [facility] ambulatory surgery facility.  We are very sorry for the inconvenience.  Procedure appointments are limited to planned procedures: No Prescription Refills. No disability issues will be discussed. No medication changes will be  discussed.  Instructions: Oral Intake: Do not eat or drink anything for at least 8 hours prior to your procedure. (Exception: Blood Pressure Medication. See below.) Transportation: A driver is required. You may not drive yourself after the procedure. Blood Pressure Medicine: Do not forget to take your blood pressure medicine with a sip of water the morning of the procedure. If your Diastolic (lower reading) is above 100 mmHg, elective cases will be cancelled/rescheduled. Blood thinners: These will need to be stopped for procedures. Notify our staff if you are taking any blood thinners. Depending on which one you take, there will be specific instructions on how and when to stop it. Diabetics on insulin: Notify the staff so that you can be scheduled 1st case in the morning. If your diabetes requires high dose insulin, take only  of your normal insulin dose the morning of the procedure and notify the staff that you have done so. Preventing infections: Shower with an antibacterial soap the morning of your procedure. Build-up your immune system: Take 1000 mg of Vitamin C with every meal (3 times a day) the day prior to your procedure. Antibiotics: Inform the staff if you have a condition or reason that requires you to take antibiotics before dental procedures. Pregnancy: If you are pregnant, call and cancel the procedure. Sickness: If you have a cold, fever, or any active infections, call and cancel the procedure. Arrival: You must be in the facility at least 30 minutes prior to your scheduled procedure. Children: Do not bring children with you. Dress appropriately: Bring dark clothing that you would not mind if they get stained. Valuables: Do not bring any jewelry or valuables.  Reasons to call and reschedule or cancel your procedure: (Following these recommendations will minimize the risk of a serious complication.) Surgeries: Avoid having procedures within 2 weeks of any surgery. (Avoid for 2 weeks  before or after any surgery). Flu Shots: Avoid having procedures within 2 weeks of a flu shots. (Avoid for 2 weeks before or after immunizations). Barium: Avoid having a procedure within 7-10 days after having had a radiological study involving the use of radiological contrast. (Myelograms, Barium swallow or enema  study). Heart attacks: Avoid any elective procedures or surgeries for the initial 6 months after a "Myocardial Infarction" (Heart Attack). Blood thinners: It is imperative that you stop these medications before procedures. Let us know if you if you take any blood thinner.  Infection: Avoid procedures during or within two weeks of an infection (including chest colds or gastrointestinal problems). Symptoms associated with infections include: Localized redness, fever, chills, night sweats or profuse sweating, burning sensation when voiding, cough, congestion, stuffiness, runny nose, sore throat, diarrhea, nausea, vomiting, cold or Flu symptoms, recent or current infections. It is specially important if the infection is over the area that we intend to treat. Heart and lung problems: Symptoms that may suggest an active cardiopulmonary problem include: cough, chest pain, breathing difficulties or shortness of breath, dizziness, ankle swelling, uncontrolled high or unusually low blood pressure, and/or palpitations. If you are experiencing any of these symptoms, cancel your procedure and contact your primary care physician for an evaluation.  Remember:  Regular Business hours are:  Monday to Thursday 8:00 AM to 4:00 PM  Provider's Schedule: Milinda Pointer, MD:  Procedure days: Tuesday and Thursday 7:30 AM to 4:00 PM  Gillis Santa, MD:  Procedure days: Monday and Wednesday 7:30 AM to 4:00 PM ______________________________________________________________________  ____________________________________________________________________________________________  General Risks and Possible  Complications  Patient Responsibilities: It is important that you read this as it is part of your informed consent. It is our duty to inform you of the risks and possible complications associated with treatments offered to you. It is your responsibility as a patient to read this and to ask questions about anything that is not clear or that you believe was not covered in this document.  Patients Rights: You have the right to refuse treatment. You also have the right to change your mind, even after initially having agreed to have the treatment done. However, under this last option, if you wait until the last second to change your mind, you may be charged for the materials used up to that point.  Introduction: Medicine is not an Chief Strategy Officer. Everything in Medicine, including the lack of treatment(s), carries the potential for danger, harm, or loss (which is by definition: Risk). In Medicine, a complication is a secondary problem, condition, or disease that can aggravate an already existing one. All treatments carry the risk of possible complications. The fact that a side effects or complications occurs, does not imply that the treatment was conducted incorrectly. It must be clearly understood that these can happen even when everything is done following the highest safety standards.  No treatment: You can choose not to proceed with the proposed treatment alternative. The PRO(s) would include: avoiding the risk of complications associated with the therapy. The CON(s) would include: not getting any of the treatment benefits. These benefits fall under one of three categories: diagnostic; therapeutic; and/or palliative. Diagnostic benefits include: getting information which can ultimately lead to improvement of the disease or symptom(s). Therapeutic benefits are those associated with the successful treatment of the disease. Finally, palliative benefits are those related to the decrease of the primary  symptoms, without necessarily curing the condition (example: decreasing the pain from a flare-up of a chronic condition, such as incurable terminal cancer).  General Risks and Complications: These are associated to most interventional treatments. They can occur alone, or in combination. They fall under one of the following six (6) categories: no benefit or worsening of symptoms; bleeding; infection; nerve damage; allergic reactions; and/or death. No benefits or  worsening of symptoms: In Medicine there are no guarantees, only probabilities. No healthcare provider can ever guarantee that a medical treatment will work, they can only state the probability that it may. Furthermore, there is always the possibility that the condition may worsen, either directly, or indirectly, as a consequence of the treatment. Bleeding: This is more common if the patient is taking a blood thinner, either prescription or over the counter (example: Goody Powders, Fish oil, Aspirin, Garlic, etc.), or if suffering a condition associated with impaired coagulation (example: Hemophilia, cirrhosis of the liver, low platelet counts, etc.). However, even if you do not have one on these, it can still happen. If you have any of these conditions, or take one of these drugs, make sure to notify your treating physician. Infection: This is more common in patients with a compromised immune system, either due to disease (example: diabetes, cancer, human immunodeficiency virus [HIV], etc.), or due to medications or treatments (example: therapies used to treat cancer and rheumatological diseases). However, even if you do not have one on these, it can still happen. If you have any of these conditions, or take one of these drugs, make sure to notify your treating physician. Nerve Damage: This is more common when the treatment is an invasive one, but it can also happen with the use of medications, such as those used in the treatment of cancer. The damage  can occur to small secondary nerves, or to large primary ones, such as those in the spinal cord and brain. This damage may be temporary or permanent and it may lead to impairments that can range from temporary numbness to permanent paralysis and/or brain death. Allergic Reactions: Any time a substance or material comes in contact with our body, there is the possibility of an allergic reaction. These can range from a mild skin rash (contact dermatitis) to a severe systemic reaction (anaphylactic reaction), which can result in death. Death: In general, any medical intervention can result in death, most of the time due to an unforeseen complication. ____________________________________________________________________________________________ ______________________________________________________________________  Preparing for your procedure (without sedation)  Procedure appointments are limited to planned procedures: No Prescription Refills. No disability issues will be discussed. No medication changes will be discussed.  Instructions: Oral Intake: Do not eat or drink anything for at least 6 hours prior to your procedure. (Exception: Blood Pressure Medication. See below.) Transportation: Unless otherwise stated by your physician, you may drive yourself after the procedure. Blood Pressure Medicine: Do not forget to take your blood pressure medicine with a sip of water the morning of the procedure. If your Diastolic (lower reading)is above 100 mmHg, elective cases will be cancelled/rescheduled. Blood thinners: These will need to be stopped for procedures. Notify our staff if you are taking any blood thinners. Depending on which one you take, there will be specific instructions on how and when to stop it. Diabetics on insulin: Notify the staff so that you can be scheduled 1st case in the morning. If your diabetes requires high dose insulin, take only  of your normal insulin dose the morning of the  procedure and notify the staff that you have done so. Preventing infections: Shower with an antibacterial soap the morning of your procedure.  Build-up your immune system: Take 1000 mg of Vitamin C with every meal (3 times a day) the day prior to your procedure. Antibiotics: Inform the staff if you have a condition or reason that requires you to take antibiotics before dental procedures. Pregnancy: If you are  pregnant, call and cancel the procedure. Sickness: If you have a cold, fever, or any active infections, call and cancel the procedure. Arrival: You must be in the facility at least 30 minutes prior to your scheduled procedure. Children: Do not bring any children with you. Dress appropriately: Bring dark clothing that you would not mind if they get stained. Valuables: Do not bring any jewelry or valuables.  Reasons to call and reschedule or cancel your procedure: (Following these recommendations will minimize the risk of a serious complication.) Surgeries: Avoid having procedures within 2 weeks of any surgery. (Avoid for 2 weeks before or after any surgery). Flu Shots: Avoid having procedures within 2 weeks of a flu shots or . (Avoid for 2 weeks before or after immunizations). Barium: Avoid having a procedure within 7-10 days after having had a radiological study involving the use of radiological contrast. (Myelograms, Barium swallow or enema study). Heart attacks: Avoid any elective procedures or surgeries for the initial 6 months after a "Myocardial Infarction" (Heart Attack). Blood thinners: It is imperative that you stop these medications before procedures. Let us know if you if you take any blood thinner.  Infection: Avoid procedures during or within two weeks of an infection (including chest colds or gastrointestinal problems). Symptoms associated with infections include: Localized redness, fever, chills, night sweats or profuse sweating, burning sensation when voiding, cough, congestion,  stuffiness, runny nose, sore throat, diarrhea, nausea, vomiting, cold or Flu symptoms, recent or current infections. It is specially important if the infection is over the area that we intend to treat. Heart and lung problems: Symptoms that may suggest an active cardiopulmonary problem include: cough, chest pain, breathing difficulties or shortness of breath, dizziness, ankle swelling, uncontrolled high or unusually low blood pressure, and/or palpitations. If you are experiencing any of these symptoms, cancel your procedure and contact your primary care physician for an evaluation.  Remember:  Regular Business hours are:  Monday to Thursday 8:00 AM to 4:00 PM  Provider's Schedule: Milinda Pointer, MD:  Procedure days: Tuesday and Thursday 7:30 AM to 4:00 PM  Gillis Santa, MD:  Procedure days: Monday and Wednesday 7:30 AM to 4:00 PM ______________________________________________________________________

## 2021-05-21 ENCOUNTER — Encounter (INDEPENDENT_AMBULATORY_CARE_PROVIDER_SITE_OTHER): Payer: Self-pay | Admitting: Family Medicine

## 2021-05-21 ENCOUNTER — Ambulatory Visit (INDEPENDENT_AMBULATORY_CARE_PROVIDER_SITE_OTHER): Payer: Medicare HMO | Admitting: Family Medicine

## 2021-05-21 ENCOUNTER — Other Ambulatory Visit: Payer: Self-pay

## 2021-05-21 VITALS — BP 127/76 | HR 62 | Temp 97.8°F | Ht 64.0 in | Wt 246.0 lb

## 2021-05-21 DIAGNOSIS — Z6841 Body Mass Index (BMI) 40.0 and over, adult: Secondary | ICD-10-CM

## 2021-05-21 DIAGNOSIS — I10 Essential (primary) hypertension: Secondary | ICD-10-CM

## 2021-05-21 DIAGNOSIS — E669 Obesity, unspecified: Secondary | ICD-10-CM | POA: Diagnosis not present

## 2021-05-21 NOTE — Progress Notes (Signed)
Chief Complaint:   OBESITY Jamie Phillips is here to discuss her progress with her obesity treatment plan along with follow-up of her obesity related diagnoses. Jamie Phillips is on the Category 2 Plan and states she is following her eating plan approximately 50% of the time. Jamie Phillips states she is not currently exercising.  Today's visit was #: 8 Starting weight: 257 lbs Starting date: 12/26/2020 Today's weight: 246 lbs Today's date: 05/21/2021 Total lbs lost to date: 11 Total lbs lost since last in-office visit: 0  Interim History: Pt is concerned about her daughter who may be having another stroke. She couldn't get spices from recipes due to cost. Pt is eating mostly salads with chicken. 6 oz of chicken at supper. Pt is skipping breakfast daily due to pain. Lunch is boiled egg or salad. She is sometimes eating out when in pain.  Subjective:   1. Essential hypertension BP well controlled. Pt is on Hyzaar and Inderal.  Assessment/Plan:   1. Essential hypertension Jamie Phillips is working on healthy weight loss and exercise to improve blood pressure control. We will watch for signs of hypotension as she continues her lifestyle modifications. Continue current treatment plan with no change in dose.  2. Obesity with current BMI of 41.0 Jamie Phillips is currently in the action stage of change. As such, her goal is to continue with weight loss efforts. She has agreed to the Category 2 Plan and keeping a food journal and adhering to recommended goals of 500-600 calories and 40+ grams protein with breakfast.   Exercise goals:  As is  Behavioral modification strategies: increasing lean protein intake, meal planning and cooking strategies, and keeping healthy foods in the home.  Jamie Phillips has agreed to follow-up with our clinic in 3-4 weeks. She was informed of the importance of frequent follow-up visits to maximize her success with intensive lifestyle modifications for her multiple health conditions.   Objective:   Blood pressure  127/76, pulse 62, temperature 97.8 F (36.6 C), height 5\' 4"  (1.626 m), weight 246 lb (111.6 kg), SpO2 98 %. Body mass index is 42.23 kg/m.  General: Cooperative, alert, well developed, in no acute distress. HEENT: Conjunctivae and lids unremarkable. Cardiovascular: Regular rhythm.  Lungs: Normal work of breathing. Neurologic: No focal deficits.   Lab Results  Component Value Date   CREATININE 0.98 03/30/2021   BUN 24 (H) 03/30/2021   NA 140 03/30/2021   K 3.3 (L) 03/30/2021   CL 101 03/30/2021   CO2 30 03/30/2021   Lab Results  Component Value Date   ALT 17 03/30/2021   AST 19 03/30/2021   ALKPHOS 81 03/30/2021   BILITOT 0.4 03/30/2021   Lab Results  Component Value Date   HGBA1C 5.8 (H) 12/26/2020   Lab Results  Component Value Date   INSULIN 14.4 12/26/2020   Lab Results  Component Value Date   TSH 3.590 12/26/2020   Lab Results  Component Value Date   CHOL 240 (H) 12/26/2020   HDL 59 12/26/2020   LDLCALC 145 (H) 12/26/2020   TRIG 202 (H) 12/26/2020   Lab Results  Component Value Date   VD25OH 28.9 (L) 12/26/2020   Lab Results  Component Value Date   WBC 15.0 (H) 03/30/2021   HGB 13.1 03/30/2021   HCT 40.5 03/30/2021   MCV 100.5 (H) 03/30/2021   PLT 253 03/30/2021   Lab Results  Component Value Date   FERRITIN 86 12/18/2018    Attestation Statements:   Reviewed by clinician on day  of visit: allergies, medications, problem list, medical history, surgical history, family history, social history, and previous encounter notes.  Coral Ceo, CMA, am acting as transcriptionist for Coralie Common, MD.   I have reviewed the above documentation for accuracy and completeness, and I agree with the above. - Coralie Common, MD

## 2021-05-22 ENCOUNTER — Ambulatory Visit (HOSPITAL_BASED_OUTPATIENT_CLINIC_OR_DEPARTMENT_OTHER): Payer: Medicare HMO | Admitting: Pain Medicine

## 2021-05-22 ENCOUNTER — Ambulatory Visit
Admission: RE | Admit: 2021-05-22 | Discharge: 2021-05-22 | Disposition: A | Discharge status: Home / Self Care | Payer: Medicare HMO | Source: Ambulatory Visit | Attending: Pain Medicine | Admitting: Pain Medicine

## 2021-05-22 ENCOUNTER — Encounter: Payer: Self-pay | Admitting: Pain Medicine

## 2021-05-22 VITALS — BP 142/70 | HR 54 | Temp 97.4°F | Resp 19 | Ht 64.0 in | Wt 246.0 lb

## 2021-05-22 DIAGNOSIS — M48061 Spinal stenosis, lumbar region without neurogenic claudication: Secondary | ICD-10-CM | POA: Insufficient documentation

## 2021-05-22 DIAGNOSIS — M5417 Radiculopathy, lumbosacral region: Secondary | ICD-10-CM | POA: Diagnosis present

## 2021-05-22 DIAGNOSIS — M5137 Other intervertebral disc degeneration, lumbosacral region: Secondary | ICD-10-CM | POA: Diagnosis not present

## 2021-05-22 DIAGNOSIS — M4316 Spondylolisthesis, lumbar region: Secondary | ICD-10-CM | POA: Insufficient documentation

## 2021-05-22 DIAGNOSIS — M25551 Pain in right hip: Secondary | ICD-10-CM | POA: Insufficient documentation

## 2021-05-22 DIAGNOSIS — R29898 Other symptoms and signs involving the musculoskeletal system: Secondary | ICD-10-CM | POA: Diagnosis not present

## 2021-05-22 DIAGNOSIS — M79604 Pain in right leg: Secondary | ICD-10-CM | POA: Insufficient documentation

## 2021-05-22 DIAGNOSIS — M79605 Pain in left leg: Secondary | ICD-10-CM | POA: Insufficient documentation

## 2021-05-22 DIAGNOSIS — G8929 Other chronic pain: Secondary | ICD-10-CM

## 2021-05-22 DIAGNOSIS — M25552 Pain in left hip: Secondary | ICD-10-CM | POA: Diagnosis present

## 2021-05-22 DIAGNOSIS — M5136 Other intervertebral disc degeneration, lumbar region: Secondary | ICD-10-CM | POA: Diagnosis not present

## 2021-05-22 DIAGNOSIS — Z7901 Long term (current) use of anticoagulants: Secondary | ICD-10-CM | POA: Diagnosis present

## 2021-05-22 DIAGNOSIS — M4317 Spondylolisthesis, lumbosacral region: Secondary | ICD-10-CM

## 2021-05-22 DIAGNOSIS — M5410 Radiculopathy, site unspecified: Secondary | ICD-10-CM | POA: Insufficient documentation

## 2021-05-22 DIAGNOSIS — M5416 Radiculopathy, lumbar region: Secondary | ICD-10-CM

## 2021-05-22 DIAGNOSIS — H353114 Nonexudative age-related macular degeneration, right eye, advanced atrophic with subfoveal involvement: Secondary | ICD-10-CM | POA: Diagnosis not present

## 2021-05-22 DIAGNOSIS — M5386 Other specified dorsopathies, lumbar region: Secondary | ICD-10-CM | POA: Insufficient documentation

## 2021-05-22 DIAGNOSIS — M48062 Spinal stenosis, lumbar region with neurogenic claudication: Secondary | ICD-10-CM

## 2021-05-22 DIAGNOSIS — H353221 Exudative age-related macular degeneration, left eye, with active choroidal neovascularization: Secondary | ICD-10-CM | POA: Diagnosis not present

## 2021-05-22 DIAGNOSIS — M545 Low back pain, unspecified: Secondary | ICD-10-CM | POA: Diagnosis present

## 2021-05-22 MED ORDER — LIDOCAINE HCL 2 % IJ SOLN
20.0000 mL | Freq: Once | INTRAMUSCULAR | Status: AC
  Administered 2021-05-22: 400 mg

## 2021-05-22 MED ORDER — SODIUM CHLORIDE (PF) 0.9 % IJ SOLN
INTRAMUSCULAR | Status: AC
  Filled 2021-05-22: qty 10

## 2021-05-22 MED ORDER — LIDOCAINE HCL 2 % IJ SOLN
INTRAMUSCULAR | Status: AC
  Filled 2021-05-22: qty 20

## 2021-05-22 MED ORDER — ROPIVACAINE HCL 2 MG/ML IJ SOLN
INTRAMUSCULAR | Status: AC
  Filled 2021-05-22: qty 20

## 2021-05-22 MED ORDER — TRIAMCINOLONE ACETONIDE 40 MG/ML IJ SUSP
INTRAMUSCULAR | Status: AC
  Filled 2021-05-22: qty 1

## 2021-05-22 MED ORDER — TRIAMCINOLONE ACETONIDE 40 MG/ML IJ SUSP
40.0000 mg | Freq: Once | INTRAMUSCULAR | Status: AC
  Administered 2021-05-22: 40 mg

## 2021-05-22 MED ORDER — MIDAZOLAM HCL 5 MG/5ML IJ SOLN
0.5000 mg | Freq: Once | INTRAMUSCULAR | Status: AC
  Administered 2021-05-22: 1.5 mg via INTRAVENOUS

## 2021-05-22 MED ORDER — LACTATED RINGERS IV SOLN
1000.0000 mL | Freq: Once | INTRAVENOUS | Status: AC
  Administered 2021-05-22: 1000 mL via INTRAVENOUS

## 2021-05-22 MED ORDER — IOHEXOL 180 MG/ML  SOLN
10.0000 mL | Freq: Once | INTRAMUSCULAR | Status: AC
  Administered 2021-05-22: 5 mL via EPIDURAL

## 2021-05-22 MED ORDER — ROPIVACAINE HCL 2 MG/ML IJ SOLN
2.0000 mL | Freq: Once | INTRAMUSCULAR | Status: AC
  Administered 2021-05-22: 20 mL via EPIDURAL

## 2021-05-22 MED ORDER — SODIUM CHLORIDE 0.9% FLUSH
2.0000 mL | Freq: Once | INTRAVENOUS | Status: AC
  Administered 2021-05-22: 10 mL

## 2021-05-22 MED ORDER — MIDAZOLAM HCL 5 MG/5ML IJ SOLN
INTRAMUSCULAR | Status: AC
  Filled 2021-05-22: qty 5

## 2021-05-22 NOTE — Progress Notes (Signed)
PROVIDER NOTE: Interpretation of information contained herein should be left to medically-trained personnel. Specific patient instructions are provided elsewhere under "Patient Instructions" section of medical record. This document was created in part using STT-dictation technology, any transcriptional errors that may result from this process are unintentional.  Patient: Jamie Phillips Type: Established DOB: 1941-03-14 MRN: [id number] PCP: Kirk Ruths, MD  Service: Procedure DOS: 05/22/2021 Setting: Ambulatory Location: Ambulatory outpatient facility Delivery: Face-to-face Provider: Gaspar Cola, MD Specialty: Interventional Pain Management Specialty designation: 09 Location: Outpatient facility Ref. Prov.: Milinda Pointer, MD    Primary Reason for Visit: Interventional Pain Management Treatment. CC: Back Pain (Right, lower)   Procedure:           Type: Lumbar epidural steroid injection (LESI) (interlaminar) #1    Laterality: Right   Level:  L4-5 Level.  Imaging: Fluoroscopic guidance Anesthesia: Local anesthesia (1-2% Lidocaine) Anxiolysis: None                 Sedation: None. DOS: 05/22/2021  Performed by: Gaspar Cola, MD  Purpose: Diagnostic/Therapeutic Indications: Lumbar radicular pain of intraspinal etiology of more than 4 weeks that has failed to respond to conservative therapy and is severe enough to impact quality of life or function. 1. 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 Chronic lower extremity pain (1ry area of Pain) (Bilateral)   5. 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 Annular tear of lumbar disc (L3-4) (Left)   8. Anterolisthesis of lumbosacral spine (L5/S1)   9. DDD (degenerative disc disease),  lumbosacral   10. Decreased range of motion of lumbar spine   11. Lower extremity weakness (Bilateral)   12. 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  Prep solution: DuraPrep (Iodine Povacrylex [0.7% available iodine] and Isopropyl Alcohol, 74% w/w) Prep Area: Entire Posterior Lumbar Region from lower scapular tip down to mid buttocks area and from flank to flank. Materials:  Tray: Epidural tray Needle(s):  Type: Epidural needle          Gauge (G):  17 Length: Regular (3.5-in) Qty: 1  Pre-op H&P Assessment:  Jamie Phillips is an 81 y.o. (year old), female patient, seen today for interventional treatment. She  has a past surgical history that includes neck fusion; Wrist surgery (Left, 2000); Cardiac catheterization (N/A, 08/15/2015); IVC FILTER INSERTION (N/A, 06/11/2016); Colonoscopy; Joint replacement (Left, 06/17/2016); Colonoscopy with propofol (N/A, 03/10/2017); IVC FILTER REMOVAL (N/A, 05/06/2017); laparoscopy (N/A, 10/10/2018); laparoscopic appendectomy (N/A, 10/10/2018); Partial colectomy (Right, 10/10/2018); Appendectomy; Esophagogastroduodenoscopy (egd) with propofol (N/A, 02/02/2019); and Breast biopsy (Left, 08/19/04). [name] has a current medication list which includes the following prescription(s): acetaminophen, cholestyramine, duloxetine, eliquis, escitalopram, gabapentin, losartan-hydrochlorothiazide, magnesium gluconate, multiple vitamins-minerals, oxycodone, [START ON 06/15/2021] oxycodone, [START ON 07/15/2021] oxycodone, pravastatin, propranolol, sucralfate, calcium carbonate, and pantoprazole. Her primarily concern today is the Back Pain (Right, lower)  Initial Vital Signs:  Pulse/HCG Rate: (!) 54ECG Heart Rate: (!) 56 Temp: (!) 97.4 F  (36.3 C) Resp: 16 BP: 130/87 SpO2: 99 %  BMI: Estimated body mass index  is 42.23 kg/m as calculated from the following:   Height as of this encounter: 5\' 4"  (1.626 m).   Weight as of this encounter: 246 lb (111.6 kg).  Risk Assessment: Allergies: Reviewed. She has No Known Allergies.  Allergy Precautions: None required Coagulopathies: Reviewed. None identified.  Blood-thinner therapy: None at this time Active Infection(s): Reviewed. None identified. [name] is afebrile  Site Confirmation: Ms. Bear was asked to confirm the procedure and laterality before marking the site Procedure checklist: Completed Consent: Before the procedure and under the influence of no sedative(s), amnesic(s), or anxiolytics, the patient was informed of the treatment options, risks and possible complications. To fulfill our ethical and legal obligations, as recommended by the American Medical Association's Code of Ethics, I have informed the patient of my clinical impression; the nature and purpose of the treatment or procedure; the risks, benefits, and possible complications of the intervention; the alternatives, including doing nothing; the risk(s) and benefit(s) of the alternative treatment(s) or procedure(s); and the risk(s) and benefit(s) of doing nothing. The patient was provided information about the general risks and possible complications associated with the procedure. These may include, but are not limited to: failure to achieve desired goals, infection, bleeding, organ or nerve damage, allergic reactions, paralysis, and death. In addition, the patient was informed of those risks and complications associated to Spine-related procedures, such as failure to decrease pain; infection (i.e.: Meningitis, epidural or intraspinal abscess); bleeding (i.e.: epidural hematoma, subarachnoid hemorrhage, or any other type of intraspinal or peri-dural bleeding); organ or nerve damage (i.e.: Any type of peripheral  nerve, nerve root, or spinal cord injury) with subsequent damage to sensory, motor, and/or autonomic systems, resulting in permanent pain, numbness, and/or weakness of one or several areas of the body; allergic reactions; (i.e.: anaphylactic reaction); and/or death. Furthermore, the patient was informed of those risks and complications associated with the medications. These include, but are not limited to: allergic reactions (i.e.: anaphylactic or anaphylactoid reaction(s)); adrenal axis suppression; blood sugar elevation that in diabetics may result in ketoacidosis or comma; water retention that in patients with history of congestive heart failure may result in shortness of breath, pulmonary edema, and decompensation with resultant heart failure; weight gain; swelling or edema; medication-induced neural toxicity; particulate matter embolism and blood vessel occlusion with resultant organ, and/or nervous system infarction; and/or aseptic necrosis of one or more joints. Finally, the patient was informed that Medicine is not an exact science; therefore, there is also the possibility of unforeseen or unpredictable risks and/or possible complications that may result in a catastrophic outcome. The patient indicated having understood very clearly. We have given the patient no guarantees and we have made no promises. Enough time was given to the patient to ask questions, all of which were answered to the patient's satisfaction. Ms. Sebek has indicated that she wanted to continue with the procedure. Attestation: I, the ordering provider, attest that I have discussed with the patient the benefits, risks, side-effects, alternatives, likelihood of achieving goals, and potential problems during recovery for the procedure that I have provided informed consent. Date   Time: 05/22/2021  8:38 AM  Pre-Procedure Preparation:  Monitoring: As per clinic protocol. Respiration, ETCO2, SpO2, BP, heart rate and rhythm monitor  placed and checked for adequate function Safety Precautions: Patient was assessed for positional comfort and pressure points before starting the procedure. Time-out: I initiated and conducted the "Time-out" before starting the procedure, as per protocol. The patient was asked to participate by confirming the accuracy of  the "Time Out" information. Verification of the correct person, site, and procedure were performed and confirmed by me, the nursing staff, and the patient. "Time-out" conducted as per Joint Commission's Universal Protocol (UP.01.01.01). Time: 1010  Description/Narrative of Procedure:          Target: Epidural space via interlaminar opening, initially targeting the lower laminar border of the superior vertebral body. Region: Lumbar Approach: Percutaneous paravertebral  Rationale (medical necessity): procedure needed and proper for the diagnosis and/or treatment of the patient's medical symptoms and needs. Procedural Technique Safety Precautions: Aspiration looking for blood return was conducted prior to all injections. At no point did we inject any substances, as a needle was being advanced. No attempts were made at seeking any paresthesias. Safe injection practices and needle disposal techniques used. Medications properly checked for expiration dates. SDV (single dose vial) medications used. Description of the Procedure: Protocol guidelines were followed. The procedure needle was introduced through the skin, ipsilateral to the reported pain, and advanced to the target area. Bone was contacted and the needle walked caudad, until the lamina was cleared. The epidural space was identified using loss-of-resistance technique with 2-3 ml of PF-NaCl (0.9% NSS), in a 5cc LOR glass syringe.  Vitals:   05/22/21 1006 05/22/21 1010 05/22/21 1015 05/22/21 1018  BP: 126/64 133/69 140/75 (!) 142/70  Pulse:      Resp: 16 18 16 19   Temp:      TempSrc:      SpO2: 95% 95% 94% 93%  Weight:       Height:        Start Time: 1010 hrs. End Time: 1011 hrs.  Imaging Guidance (Spinal):          Type of Imaging Technique: Fluoroscopy Guidance (Spinal) Indication(s): Assistance in needle guidance and placement for procedures requiring needle placement in or near specific anatomical locations not easily accessible without such assistance. Exposure Time: Please see nurses notes. Contrast: Before injecting any contrast, we confirmed that the patient did not have an allergy to iodine, shellfish, or radiological contrast. Once satisfactory needle placement was completed at the desired level, radiological contrast was injected. Contrast injected under live fluoroscopy. No contrast complications. See chart for type and volume of contrast used. Fluoroscopic Guidance: I was personally present during the use of fluoroscopy. "Tunnel Vision Technique" used to obtain the best possible view of the target area. Parallax error corrected before commencing the procedure. "Direction-depth-direction" technique used to introduce the needle under continuous pulsed fluoroscopy. Once target was reached, antero-posterior, oblique, and lateral fluoroscopic projection used confirm needle placement in all planes. Images permanently stored in EMR. Interpretation: I personally interpreted the imaging intraoperatively. Adequate needle placement confirmed in multiple planes. Appropriate spread of contrast into desired area was observed. No evidence of afferent or efferent intravascular uptake. No intrathecal or subarachnoid spread observed. Permanent images saved into the patient's record.  Antibiotic Prophylaxis:   Anti-infectives (From admission, onward)    None      Indication(s): None identified  Post-operative Assessment:  Post-procedure Vital Signs:  Pulse/HCG Rate: (!) 54(!) 53 Temp: (!) 97.4 F (36.3 C) Resp: 19 BP: (!) 142/70 SpO2: 93 %  EBL: None  Complications: No immediate post-treatment  complications observed by team, or reported by patient.  Note: The patient tolerated the entire procedure well. A repeat set of vitals were taken after the procedure and the patient was kept under observation following institutional policy, for this type of procedure. Post-procedural neurological assessment was performed, showing return to baseline,  prior to discharge. The patient was provided with post-procedure discharge instructions, including a section on how to identify potential problems. Should any problems arise concerning this procedure, the patient was given instructions to immediately contact us, at any time, without hesitation. In any case, we plan to contact the patient by telephone for a follow-up status report regarding this interventional procedure.  Comments:  No additional relevant information.  Plan of Care  Orders:  Orders Placed This Encounter  Procedures   Lumbar Epidural Injection    Scheduling Instructions:     Procedure: Interlaminar LESI L4-5     Laterality: Right     Sedation: With Sedation     Timeframe: Today    Order Specific Question:   Where will this procedure be performed?    Answer:   [facility]   DG [facility] C-ARM 1-60 MIN NO REPORT    Intraoperative interpretation by procedural physician at [place].    Standing Status:   Standing    Number of Occurrences:   1    Order Specific Question:   Reason for exam:    Answer:   Assistance in needle guidance and placement for procedures requiring needle placement in or near specific anatomical locations not easily accessible without such assistance.   Informed Consent Details: Physician/Practitioner Attestation; Transcribe to consent form and obtain patient signature    Note: Always confirm laterality of pain with Ms. Hardin Negus, before procedure. Transcribe to consent form and obtain patient signature.    Order Specific Question:   Physician/Practitioner attestation of informed consent  for procedure/surgical case    Answer:   I, the physician/practitioner, attest that I have discussed with the patient the benefits, risks, side effects, alternatives, likelihood of achieving goals and potential problems during recovery for the procedure that I have provided informed consent.    Order Specific Question:   Procedure    Answer:   Lumbar epidural steroid injection under fluoroscopic guidance    Order Specific Question:   Physician/Practitioner performing the procedure    Answer:   Emelio Schneller A. Dossie Arbour, MD    Order Specific Question:   Indication/Reason    Answer:   Low back and/or lower extremity pain secondary to lumbar radiculitis   Care order/instruction: Please confirm that the patient has stopped the Eliquis (Apixaban) x 3 days prior to procedure or surgery.    Please confirm that the patient has stopped the Eliquis (Apixaban) x 3 days prior to procedure or surgery.    Standing Status:   Standing    Number of Occurrences:   1   Provide equipment / supplies at bedside    "Epidural Tray" (Disposable   single use) Catheter: NOT required    Standing Status:   Standing    Number of Occurrences:   1    Order Specific Question:   Specify    Answer:   Epidural Tray   Bleeding precautions    Standing Status:   Standing    Number of Occurrences:   1   Chronic Opioid Analgesic:  Tramadol 50 mg, 1 tab p.o. 4 times daily (200 mg/day of tramadol) MME/day: 20 mg/day   Medications ordered for procedure: Meds ordered this encounter  Medications   iohexol (OMNIPAQUE) 180 MG/ML injection 10 mL    Must be Myelogram-compatible. If not available, you may substitute with a water-soluble, non-ionic, hypoallergenic, myelogram-compatible radiological contrast medium.   lidocaine (XYLOCAINE) 2 % (with pres) injection 400 mg   lactated ringers infusion  1,000 mL   midazolam (VERSED) 5 MG/5ML injection 0.5-2 mg    Make sure Flumazenil is available in the pyxis when using this medication. If  oversedation occurs, administer 0.2 mg IV over 15 sec. If after 45 sec no response, administer 0.2 mg again over 1 min; may repeat at 1 min intervals; not to exceed 4 doses (1 mg)   sodium chloride flush (NS) 0.9 % injection 2 mL   ropivacaine (PF) 2 mg/mL (0.2%) (NAROPIN) injection 2 mL   triamcinolone acetonide (KENALOG-40) injection 40 mg   Medications administered: We administered iohexol, lidocaine, lactated ringers, midazolam, sodium chloride flush, ropivacaine (PF) 2 mg/mL (0.2%), and triamcinolone acetonide.  See the medical record for exact dosing, route, and time of administration.  Follow-up plan:   Return in about 2 weeks (around 06/05/2021) for Proc-day (T,Th), (F2F), (PPE).       Interventional Therapies  Risk   Complexity Considerations:   Estimated body mass index is 43.6 kg/m as calculated from the following:   Height as of this encounter: 5\' 5"  (1.651 m).   Weight as of this encounter: 262 lb (118.8 kg). NOTE: Eliquis Anticoagulation: (Stop: 3 days   Restart: 6 hrs)   Planned   Pending:   Therapeutic right L4-5 LESI #1    Under consideration:   Diagnostic bilateral iliopsoas bursa injection #1  Diagnostic midline T12-L1 LESI    Completed by Sharlet Salina, DO:      Completed:   Diagnostic bilateral IA hip joint injection x1 (11/30/2020) (100/100/80/80) Diagnostic bilateral lumbar facet MBB x3 (01/04/2021) (100/50/0/70)  Diagnostic bilateral L3 TFESI x3 (11/07/2020) (100/100/100/100% relief of the bilateral L1/L2 radiculitis)  Diagnostic right L2-3 LESI x1 (07/11/2020) (100/100/100/100% relief of the bilateral L1/L2 radiculitis)    Therapeutic   Palliative (PRN) options:   Therapeutic right L2-3 LESI #2  Therapeutic bilateral L3 transforaminal ESI #4  Diagnostic bilateral lumbar facet MBB #3      Recent Visits Date Type Provider Dept  05/14/21 Office Visit Milinda Pointer, MD [facility]  Showing recent visits within past 90 days and meeting all  other requirements Today's Visits Date Type Provider Dept  05/22/21 Procedure visit Milinda Pointer, MD [facility]  Showing today's visits and meeting all other requirements Future Appointments Date Type Provider Dept  06/05/21 Appointment Milinda Pointer, MD [facility]  07/30/21 Appointment Milinda Pointer, MD [facility]  Showing future appointments within next 90 days and meeting all other requirements  Disposition: Discharge home  Discharge (Date   Time): 05/22/2021; 1030 hrs.   Primary Care Physician: Kirk Ruths, MD Location: [facility] Note by: Gaspar Cola, MD Date: 05/22/2021; Time: 12:25 PM  Disclaimer:  Medicine is not an Chief Strategy Officer. The only guarantee in medicine is that nothing is guaranteed. It is important to note that the decision to proceed with this intervention was based on the information collected from the patient. The Data and conclusions were drawn from the patient's questionnaire, the interview, and the physical examination. Because the information was provided in large part by the patient, it cannot be guaranteed that it has not been purposely or unconsciously manipulated. Every effort has been made to obtain as much relevant data as possible for this evaluation. It is important to note that the conclusions that lead to this procedure are derived in large part from the available data. Always take into account that the treatment will also be dependent on availability of resources and existing  treatment guidelines, considered by other Pain Management Practitioners as being common knowledge and practice, at the time of the intervention. For Medico-Legal purposes, it is also important to point out that variation in procedural techniques and pharmacological choices are the acceptable norm. The indications, contraindications, technique, and results of the above procedure should only be  interpreted and judged by a Board-Certified Interventional Pain Specialist with extensive familiarity and expertise in the same exact procedure and technique.

## 2021-05-22 NOTE — Patient Instructions (Addendum)
____________________________________________________________________________________________  Post-procedure Information What to expect: Most procedures involve the use of a local anesthetic (numbing medicine), and a steroid (anti-inflammatory medicine).  The local anesthetics may cause temporary numbness and weakness of the legs or arms, depending on the location of the block. This numbness/weakness may last 4-6 hours, depending on the local anesthetic used. In rare instances, it can last up to 24 hours. While numb, you must be very careful not to injure the extremity.  After any procedure, you could expect the pain to get better within 15-20 minutes. This relief is temporary and may last 4-6 hours. Once the local anesthetics wears off, you could experience discomfort, possibly more than usual, for up to 10 (ten) days. In the case of radiofrequencies, it may last up to 6 weeks. Surgeries may take up to 8 weeks for the healing process. The discomfort is due to the irritation caused by needles going through skin and muscle. To minimize the discomfort, we recommend using ice the first day, and heat from then on. The ice should be applied for 15 minutes on, and 15 minutes off. Keep repeating this cycle until bedtime. Avoid applying the ice directly to the skin, to prevent frostbite. Heat should be used daily, until the pain improves (4-10 days). Be careful not to burn yourself.  Occasionally you may experience muscle spasms or cramps. These occur as a consequence of the irritation caused by the needle sticks to the muscle and the blood that will inevitably be lost into the surrounding muscle tissue. Blood tends to be very irritating to tissues, which tend to react by going into spasm. These spasms may start the same day of your procedure, but they may also take days to develop. This late onset type of spasm or cramp is usually caused by electrolyte imbalances triggered by the steroids, at the level of the  kidney. Cramps and spasms tend to respond well to muscle relaxants, multivitamins (some are triggered by the procedure, but may have their origins in vitamin deficiencies), and Gatorade, or any sports drinks that can replenish any electrolyte imbalances. (If you are a diabetic, ask your pharmacist to get you a sugar-free brand.) Warm showers or baths may also be helpful. Stretching exercises are highly recommended.  General Instructions:  Be alert for signs of possible infection: redness, swelling, heat, red streaks, elevated temperature, and/or fever. These typically appear 4 to 6 days after the procedure. Immediately notify your doctor if you experience unusual bleeding, difficulty breathing, or loss of bowel or bladder control. If you experience increased pain, do not increase your pain medicine intake, unless instructed by your pain physician.  Post-Procedure Care:  Be careful in moving about. Muscle spasms in the area of the injection may occur. Applying ice or heat to the area is often helpful. The incidence of spinal headaches after epidural injections ranges between 1.4% and 6%. If you develop a headache that does not seem to respond to conservative therapy, please let your physician know. This can be treated with an epidural blood patch.   Post-procedure numbness or redness is to be expected, however it should average 4 to 6 hours. If numbness and weakness of your extremities begins to develop 4 to 6 hours after your procedure, and is felt to be progressing and worsening, immediately contact your physician.  Diet:  If you experience nausea, do not eat until this sensation goes away. If you had a Stellate Ganglion Block for upper extremity Reflex Sympathetic Dystrophy, do not eat or  drink until your hoarseness goes away. In any case, always start with liquids first and if you tolerate them well, then slowly progress to more solid foods.  Activity:  For the first 4 to 6 hours after the  procedure, use caution in moving about as you may experience numbness and/or weakness. Use caution in cooking, using household electrical appliances, and climbing steps. If you need to reach your Doctor call our office: [phone] (During business hours) or [phone] (After business hours).  Business Hours: Monday-Thursday 8:00 am - 4:00 PM    Fridays: Closed     In case of an emergency: In case of emergency, call 911 or go to the nearest emergency room and have the physician there call us.  Interpretation of Procedure Every nerve block has two components: a diagnostic component, and a treatment component. Unrealistic expectations are the most common causes of perceived failure.  In a perfect world, a single nerve block should be able to completely and permanently eliminate the pain. Sadly, the world is not perfect.  Most pain management nerve blocks are performed using local anesthetics and steroids. Steroids are responsible for any long-term benefit that you may experience. Their purpose is to decrease any chronic swelling that may exist in the area. Steroids begin to work immediately after being injected. However, most patients will not experience any benefits until 5 to 10 days after the injection, when the swelling has come down to the point where they can tell a difference. Steroids will only help if there is swelling to be treated. As such, they can assist with the diagnosis. If effective, they suggest an inflammatory component to the pain, and if ineffective, they rule out inflammation as the main cause or component of the problem. If the problem is one of mechanical compression, you will get no benefit from those steroids.   In the case of local anesthetics, they have a crucial role in the diagnosis of your condition. Most will begin to work within15 to 20 minutes after injection. The duration will depend on the type used (short- vs. Long-acting). It is of outmost importance that  patients keep tract of their pain, after the procedure. To assist with this matter, a Post-procedure Pain Diary is provided. Make sure to complete it and to bring it back to your follow-up appointment.  As long as the patient keeps accurate, detailed records of their symptoms after every procedure, and returns to have those interpreted, every procedure will provide [name] with invaluable information. Even a block that does not provide the patient with any relief, will always provide [name] with information about the mechanism and the origin of the pain. The only time a nerve block can be considered a waste of time is when patients do not keep track of the results, or do not keep their post-procedure appointment.  Reporting the results back to your physician The Pain Score  Pain is a subjective complaint. It cannot be seen, touched, or measured. We depend entirely on the patients report of the pain in order to assess your condition and treatment. To evaluate the pain, we use a pain scale, where 0 means No Pain, and a 10 is the worst possible pain that you can even imagine (i.e. something like been eaten alive by a shark or being torn apart by a lion).   Use the Pain Scale provided. You will frequently be asked to rate your pain. Please be accurate, remember that medical decisions will be based on your  responses. Please do not rate your pain above a 10. Doing so is actually interpreted as symptom magnification (exaggeration). To put this into perspective, when you tell us that your pain is at a 10 (ten), what you are saying is that there is nothing we can do to make this pain any worse. (Carefully think about that.) ____________________________________________________________________________________________  Dennis Bast may restart your Eliquis tomorrow.  ____________________________________________________________________________________________  Post-Procedure Discharge  Instructions  Instructions: Apply ice:  Purpose: This will minimize any swelling and discomfort after procedure.  When: Day of procedure, as soon as you get home. How: Fill a plastic sandwich bag with crushed ice. Cover it with a small towel and apply to injection site. How long: (15 min on, 15 min off) Apply for 15 minutes then remove x 15 minutes.  Repeat sequence on day of procedure, until you go to bed. Apply heat:  Purpose: To treat any soreness and discomfort from the procedure. When: Starting the next day after the procedure. How: Apply heat to procedure site starting the day following the procedure. How long: May continue to repeat daily, until discomfort goes away. Food intake: Start with clear liquids (like water) and advance to regular food, as tolerated.  Physical activities: Keep activities to a minimum for the first 8 hours after the procedure. After that, then as tolerated. Driving: If you have received any sedation, be responsible and do not drive. You are not allowed to drive for 24 hours after having sedation. Blood thinner: (Applies only to those taking blood thinners) You may restart your blood thinner 6 hours after your procedure. Insulin: (Applies only to Diabetic patients taking insulin) As soon as you can eat, you may resume your normal dosing schedule. Infection prevention: Keep procedure site clean and dry. Shower daily and clean area with soap and water. Post-procedure Pain Diary: Extremely important that this be done correctly and accurately. Recorded information will be used to determine the next step in treatment. For the purpose of accuracy, follow these rules: Evaluate only the area treated. Do not report or include pain from an untreated area. For the purpose of this evaluation, ignore all other areas of pain, except for the treated area. After your procedure, avoid taking a long nap and attempting to complete the pain diary after you wake up. Instead, set your  alarm clock to go off every hour, on the hour, for the initial 8 hours after the procedure. Document the duration of the numbing medicine, and the relief you are getting from it. Do not go to sleep and attempt to complete it later. It will not be accurate. If you received sedation, it is likely that you were given a medication that may cause amnesia. Because of this, completing the diary at a later time may cause the information to be inaccurate. This information is needed to plan your care. Follow-up appointment: Keep your post-procedure follow-up evaluation appointment after the procedure (usually 2 weeks for most procedures, 6 weeks for radiofrequencies). DO NOT FORGET to bring you pain diary with you.   Expect: (What should I expect to see with my procedure?) From numbing medicine (AKA: Local Anesthetics): Numbness or decrease in pain. You may also experience some weakness, which if present, could last for the duration of the local anesthetic. Onset: Full effect within 15 minutes of injected. Duration: It will depend on the type of local anesthetic used. On the average, 1 to 8 hours.  From steroids (Applies only if steroids were used): Decrease in swelling or  inflammation. Once inflammation is improved, relief of the pain will follow. Onset of benefits: Depends on the amount of swelling present. The more swelling, the longer it will take for the benefits to be seen. In some cases, up to 10 days. Duration: Steroids will stay in the system x 2 weeks. Duration of benefits will depend on multiple posibilities including persistent irritating factors. Side-effects: If present, they may typically last 2 weeks (the duration of the steroids). Frequent: Cramps (if they occur, drink Gatorade and take over-the-counter Magnesium 450-500 mg once to twice a day); water retention with temporary weight gain; increases in blood sugar; decreased immune system response; increased appetite. Occasional: Facial flushing  (red, warm cheeks); mood swings; menstrual changes. Uncommon: Long-term decrease or suppression of natural hormones; bone thinning. (These are more common with higher doses or more frequent use. This is why we prefer that our patients avoid having any injection therapies in other practices.)  Very Rare: Severe mood changes; psychosis; aseptic necrosis. From procedure: Some discomfort is to be expected once the numbing medicine wears off. This should be minimal if ice and heat are applied as instructed.  Call if: (When should I call?) You experience numbness and weakness that gets worse with time, as opposed to wearing off. New onset bowel or bladder incontinence. (Applies only to procedures done in the spine)  Emergency Numbers: Durning business hours (Monday - Thursday, 8:00 AM - 4:00 PM) (Friday, 9:00 AM - 12:00 Noon): [phone] After hours: [phone] NOTE: If you are having a problem and are unable connect with, or to talk to a provider, then go to your nearest urgent care or emergency department. If the problem is serious and urgent, please call 911. ____________________________________________________________________________________________

## 2021-05-23 ENCOUNTER — Telehealth: Payer: Self-pay

## 2021-05-23 NOTE — Telephone Encounter (Signed)
Post procedure phone call.  Patient states she is doing ok.  

## 2021-06-05 ENCOUNTER — Other Ambulatory Visit: Payer: Self-pay

## 2021-06-05 ENCOUNTER — Encounter: Payer: Self-pay | Admitting: Pain Medicine

## 2021-06-05 ENCOUNTER — Ambulatory Visit: Payer: Medicare HMO | Attending: Pain Medicine | Admitting: Pain Medicine

## 2021-06-05 VITALS — BP 100/70 | HR 55 | Temp 97.1°F | Ht 64.0 in | Wt 240.0 lb

## 2021-06-05 DIAGNOSIS — M5417 Radiculopathy, lumbosacral region: Secondary | ICD-10-CM

## 2021-06-05 DIAGNOSIS — M48062 Spinal stenosis, lumbar region with neurogenic claudication: Secondary | ICD-10-CM | POA: Diagnosis not present

## 2021-06-05 DIAGNOSIS — R94131 Abnormal electromyogram [EMG]: Secondary | ICD-10-CM | POA: Diagnosis present

## 2021-06-05 DIAGNOSIS — M4316 Spondylolisthesis, lumbar region: Secondary | ICD-10-CM

## 2021-06-05 DIAGNOSIS — M25551 Pain in right hip: Secondary | ICD-10-CM | POA: Insufficient documentation

## 2021-06-05 DIAGNOSIS — R937 Abnormal findings on diagnostic imaging of other parts of musculoskeletal system: Secondary | ICD-10-CM | POA: Diagnosis not present

## 2021-06-05 DIAGNOSIS — M1611 Unilateral primary osteoarthritis, right hip: Secondary | ICD-10-CM | POA: Diagnosis present

## 2021-06-05 DIAGNOSIS — R29898 Other symptoms and signs involving the musculoskeletal system: Secondary | ICD-10-CM | POA: Diagnosis not present

## 2021-06-05 DIAGNOSIS — Z7901 Long term (current) use of anticoagulants: Secondary | ICD-10-CM | POA: Diagnosis not present

## 2021-06-05 DIAGNOSIS — M5137 Other intervertebral disc degeneration, lumbosacral region: Secondary | ICD-10-CM | POA: Diagnosis not present

## 2021-06-05 DIAGNOSIS — G8929 Other chronic pain: Secondary | ICD-10-CM

## 2021-06-05 DIAGNOSIS — M48061 Spinal stenosis, lumbar region without neurogenic claudication: Secondary | ICD-10-CM | POA: Diagnosis not present

## 2021-06-05 DIAGNOSIS — M4317 Spondylolisthesis, lumbosacral region: Secondary | ICD-10-CM | POA: Diagnosis not present

## 2021-06-05 DIAGNOSIS — M51379 Other intervertebral disc degeneration, lumbosacral region without mention of lumbar back pain or lower extremity pain: Secondary | ICD-10-CM

## 2021-06-05 NOTE — Progress Notes (Signed)
Safety precautions to be maintained throughout the outpatient stay will include: orient to surroundings, keep bed in low position, maintain call bell within reach at all times, provide assistance with transfer out of bed and ambulation.  

## 2021-06-05 NOTE — Progress Notes (Signed)
PROVIDER NOTE: Information contained herein reflects review and annotations entered in association with encounter. Interpretation of such information and data should be left to medically-trained personnel. Information provided to patient can be located elsewhere in the medical record under "Patient Instructions". Document created using STT-dictation technology, any transcriptional errors that may result from process are unintentional.    Patient: Jamie Phillips  Service Category: E/M  Provider: Gaspar Cola, MD  DOB: [birth year]  DOS: 06/05/2021  Specialty: Interventional Pain Management  MRN: [id number]  Setting: Ambulatory outpatient  PCP: Kirk Ruths, MD  Type: Established Patient    Referring Provider: Kirk Ruths, MD  Location: Office  Delivery: Face-to-face     HPI  Ms. STEPHINE LANGBEHN, a 81 y.o. 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 Ms. Hancox primary complain today is Hip Pain (right) Last encounter: My last encounter with her was on 05/22/2021. Pertinent problems: Ms. 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); Abnormal MRI, lumbar spine (01/24/2020); Lumbar facet syndrome (Multilevel) (Bilateral); Lumbar facet hypertrophy (Multilevel) (Bilateral); Severe muscle deconditioning; Lower extremity weakness  (Bilateral); Lumbosacral facet hypertrophy (Multilevel) (Bilateral); Lumbar scoliosis (idiopathic); Spondylolisthesis of lumbosacral region (Multilevel); Retrolisthesis (L1/L2); Anterolisthesis of lumbar spine (L3/L4, L4/L5); Anterolisthesis of lumbosacral spine (L5/S1); Lumbar lateral recess stenosis (Bilateral: L3-4, L4-5) (Severe); Lumbar foraminal stenosis (Right: L4-5); Synovial cyst of lumbar facet joint (L3-4) (Right); Arthropathy of spinal facet joint concurrent with and due to effusion (L3-4); Annular tear of lumbar disc (L3-4) (Left); Chronic lower extremity radicular pain (L1/L2 dermatomes) (Bilateral); Chronic groin pain (Bilateral); Neurogenic pain, lower extremity (Bilateral); Shooting pain, intermittent (electrical-like); History of total knee replacement (Bilateral) ; Spondylosis without myelopathy or radiculopathy, lumbosacral region; Chronic low back pain (2ry area of Pain) (Bilateral) w/o sciatica; Abnormal EMG (electromyogram) (05/09/2020); Abnormal CT scan, lumbar spine (06/16/2020); Chronic hip pain (Bilateral); Trochanteric bursitis of hips (Bilateral); Iliopsoas bursitis of hips; Decreased range of motion of lumbar spine; Osteoarthritis of hips (Bilateral); Sacroiliac joint dysfunction of right side; Chronic right sacroiliac joint pain; 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[place]); Chronic hip pain (Right); and Primary osteoarthritis of hip (Right) on their pertinent problem list. Pain Assessment: Severity of Chronic pain is reported as a 8 /10. Location: Hip Right/Denies. Onset: More than a month ago. Quality: Aching, Burning, Sharp. Timing: Constant. Modifying factor(s): sitting and meds. Vitals:  height is _0  (1.626 m) and weight is 240 lb (108.9 kg). Her temperature is 97.1 F (36.2 C) (abnormal). Her blood pressure is 100/70 and her pulse is 55 (abnormal).   Reason for encounter: post-procedure evaluation and assessment.  The patient refers having attained excellent relief of the  pain from the right-sided L4-5 LESI.  She refers having attained 100% relief of the pain for the duration of the local anesthetic followed by an ongoing 100% relief of the severe pain that she was experiencing in the L4 dermatomal region.  She refers still having pain in the left leg going to the top of the foot and the  big toe and what appears to be an L5 radiculopathy, as well as pain going down to the lateral aspect of her right foot, including the last 3 toes and what appears to be an S1 radiculopathy/radiculitis.  In addition to this, she is also complaining today of her worst pain being that of the right hip, and the lateral aspect of her leg above the trochanteric area but below the iliac crest.  Her 01/24/2020 MRI shows her to have several problems in the area of the L3-4, L4-5, and L5-S1 regions.  At the L3-4 level she has a 5 mm anterolisthesis with severe bilateral facet DJD and a tiny 4 mm synovial cyst in the anterior aspect of the right L3-4 facet with severe canal and bilateral subarticular stenosis.  At the L4-5 level there is a 6 mm anterolisthesis with severe right, worse than left facet DJD causing severe canal and lateral recess stenosis (R>L) and moderate right L4 foraminal narrowing.  At the L5-S1 level there is severe left facet hypertrophy.  At this point, since she still having some pain that seems to be disabling, I will be bringing her back for a right L3 and right L4 transforaminal ESI.  In addition, I will be referring her to to neurosurgery for evaluation on possible decompressive surgery.  She would like to see Dr. Deri Fuelling.  Post-procedure evaluation    Type: Lumbar epidural steroid injection (LESI) (interlaminar) #1    Laterality: Right   Level:  L4-5 Level.  Imaging: Fluoroscopic guidance Anesthesia: Local anesthesia (1-2% Lidocaine) Anxiolysis: None                 Sedation: None. DOS: 05/22/2021  Performed by: Gaspar Cola, MD  Purpose:  Diagnostic/Therapeutic Indications: Lumbar radicular pain of intraspinal etiology of more than 4 weeks that has failed to respond to conservative therapy and is severe enough to impact quality of life or function. 1. 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 Chronic lower extremity pain (1ry area of Pain) (Bilateral)   5. 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 Annular tear of lumbar disc (L3-4) (Left)   8. Anterolisthesis of lumbosacral spine (L5/S1)   9. DDD (degenerative disc disease), lumbosacral   10. Decreased range of motion of lumbar spine   11. Lower extremity weakness (Bilateral)   12. Lumbosacral radiculopathy at L5 (Left)    Chronic anticoagulation (Eliquis)    NAS-11 Pain score:   Pre-procedure: 8 /10   Post-procedure: 8 /10      Effectiveness:  Initial hour after procedure: 100 %. Subsequent 4-6 hours post-procedure: 100 %. Analgesia past initial 6 hours: 100 %. Ongoing improvement:  Analgesic: Currently the patient indicates having 90% improvement in her pain however, the 10% that she still has seems to be debilitating and it seems to be affecting both lower extremities.  On the left side it seems to predominate at the L5 level, but she refers that it is not as bad as her right side.  On the right side she has an S1 component which again does not seem to be her worst pain.  Her worst pain seems to be around her right hip area.  The MRI of her right hip indicates her to have some mild osteoarthritis, but certainly nothing that could  explain the amount of pain that she is currently experiencing.  Previously I had done a bilateral intra-articular hip joint injection which did provide her with some relief of the pain. Function: Somewhat improved ROM:  Somewhat improved  Pharmacotherapy Assessment  Analgesic: Tramadol 50 mg, 1 tab p.o. 4 times daily (200 mg/day of tramadol) MME/day: 20 mg/day   Monitoring: [place] PMP: PDMP reviewed during this encounter.       Pharmacotherapy: No side-effects or adverse reactions reported. Compliance: No problems identified. Effectiveness: Clinically acceptable.  Chauncey Fischer, RN  06/05/2021  1:37 PM  Sign when Signing Visit Safety precautions to be maintained throughout the outpatient stay will include: orient to surroundings, keep bed in low position, maintain call bell within reach at all times, provide assistance with transfer out of bed and ambulation.     UDS:  Summary  Date Value Ref Range Status  07/27/2020 Note  Final    Comment:    ==================================================================== [facility] (MW) ==================================================================== Test                             Result       Flag       Units  Drug Present and Declared for Prescription Verification   Oxycodone                      1206         EXPECTED   ng/mg creat   Oxymorphone                    90           EXPECTED   ng/mg creat   Noroxycodone                   2368         EXPECTED   ng/mg creat    Sources of oxycodone include scheduled prescription medications.    Oxymorphone and noroxycodone are expected metabolites of oxycodone.    Oxymorphone is also available as a scheduled prescription medication.  ==================================================================== Test                      Result    Flag   Units      Ref Range   Creatinine              212              mg/dL      >=20 ==================================================================== Declared Medications:  The flagging and interpretation on this report are based on the  following declared medications.  Unexpected results may arise from  inaccuracies in the declared medications.   **Note:  The testing scope of this panel includes these medications:   Oxycodone   **Note: The testing scope of this panel does not include the  following reported medications:   Acetaminophen (Tylenol)  Albuterol (Ventolin HFA)  Apixaban (Eliquis)  Cholestyramine (Questran)  Duloxetine (Cymbalta)  Escitalopram (Lexapro)  Gabapentin (Neurontin)  Hydrochlorothiazide (Hyzaar)  Losartan (Hyzaar)  Pantoprazole (Protonix)  Pravastatin (Pravachol)  Sucralfate (Carafate) ==================================================================== For clinical consultation, please call [phone]. ====================================================================      ROS  Constitutional: Denies any fever or chills Gastrointestinal: No reported hemesis, hematochezia, vomiting, or acute GI distress Musculoskeletal: Denies any acute onset joint swelling, redness, loss of ROM, or weakness Neurological: No reported episodes of acute onset apraxia, aphasia, dysarthria,  agnosia, amnesia, paralysis, loss of coordination, or loss of consciousness  Medication Review  DULoxetine, Multiple Vitamins-Minerals, acetaminophen, apixaban, calcium carbonate, cholestyramine, escitalopram, gabapentin, losartan-hydrochlorothiazide, magnesium gluconate, oxyCODONE, pantoprazole, pravastatin, propranolol, and sucralfate  History Review  Allergy: Ms. Crutcher has No Known Allergies. Drug: Ms. Grays  reports no history of drug use. Alcohol:  reports no history of alcohol use. Tobacco:  reports that she has never smoked. She has never used smokeless tobacco. Social: Ms. Ocanas  reports that she has never smoked. She has never used smokeless tobacco. She reports that she does not drink alcohol and does not use drugs. 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 Surgical: Ms. Lengyel  has a past surgical history that includes neck fusion; Wrist surgery (Left, 2000); Cardiac catheterization (N/A, 08/15/2015); IVC FILTER INSERTION (N/A, 06/11/2016); Colonoscopy; Joint replacement (Left, 06/17/2016); Colonoscopy with propofol (N/A, 03/10/2017); IVC FILTER REMOVAL (N/A, 05/06/2017); laparoscopy (N/A, 10/10/2018); laparoscopic appendectomy (N/A, 10/10/2018); Partial colectomy (Right, 10/10/2018); Appendectomy; Esophagogastroduodenoscopy (egd) with propofol (N/A, 02/02/2019); and Breast biopsy (Left, 08/19/04). Family: family history includes Congestive Heart Failure in her father; Heart failure in her father; Obesity in her mother; Pulmonary embolism in her mother; Sudden death in her father and mother.  Laboratory Chemistry Profile   Renal Lab Results  Component Value Date   BUN 24 (H) 03/30/2021   CREATININE 0.98 03/30/2021   BCR 17 12/26/2020   GFRAA 55 (L) 09/03/2019   GFRNONAA 59 (L) 03/30/2021    Hepatic Lab Results  Component Value Date   AST 19 03/30/2021   ALT 17 03/30/2021   ALBUMIN 3.8 03/30/2021   ALKPHOS 81 03/30/2021   LIPASE 37 10/09/2018    Electrolytes Lab Results  Component Value Date   NA 140 03/30/2021   K 3.3 (L) 03/30/2021   CL 101 03/30/2021   CALCIUM 9.0 03/30/2021   MG 1.9 06/21/2020    Bone Lab Results  Component Value Date   VD25OH 28.9 (L) 12/26/2020   25OHVITD1 14 (L) 06/21/2020   25OHVITD2 <1.0 06/21/2020   25OHVITD3 14 06/21/2020    Inflammation (CRP: Acute Phase) (ESR: Chronic Phase) Lab Results  Component Value Date   CRP 9  06/21/2020   ESRSEDRATE 21 06/21/2020   LATICACIDVEN 0.9 12/13/2018         Note: Above Lab results reviewed.  Recent Imaging Review  DG PAIN CLINIC C-ARM 1-60 MIN NO REPORT Fluoro was used, but no Radiologist interpretation will be provided.  Please refer to "NOTES" tab for provider progress note. Note: Reviewed        Physical Exam  General appearance: Well nourished, well developed, and well hydrated. In no apparent acute distress Mental status: Alert, oriented x 3 (person, place, & time)       Respiratory: No evidence of acute respiratory distress Eyes: PERLA Vitals: BP 100/70    Pulse (!) 55    Temp (!) 97.1 F (36.2 C)    Ht _0  (1.626 m)    Wt 240 lb (108.9 kg)    BMI 41.20 kg/m  BMI: Estimated body mass index is 41.2 kg/m as calculated from the following:  Height as of this encounter: _0  (1.626 m).   Weight as of this encounter: 240 lb (108.9 kg). Ideal: Ideal body weight: 54.7 kg (120 lb 9.5 oz) Adjusted ideal body weight: 76.4 kg (168 lb 5.7 oz)  Assessment   Status Diagnosis  Controlled Controlled Controlled 1. 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 Lower extremity weakness (Bilateral)   3. 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 Lumbar lateral recess stenosis (Bilateral: L3-4, L4-5) (Severe)   5. Lumbosacral radiculopathy at S1 (Right)   6. DDD (degenerative disc disease), lumbosacral   7. 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 Anterolisthesis of lumbosacral spine (L5/S1)   9. Chronic anticoagulation (Eliquis)   10. Abnormal CT scan, lumbar spine (06/16/2020)   11. Abnormal EMG (electromyogram) (05/09/2020)   12. Abnormal MRI, lumbar spine (01/24/2020)   13. Chronic hip pain (Right)   14. Primary osteoarthritis of hip (Right)      Updated Problems: Problem  Chronic hip pain (Right)  Primary osteoarthritis of hip (Right)  Abnormal CT scan, lumbar spine (06/16/2020)   (06/16/2020) LUMBAR CT  FINDINGS: Alignment: Mild scoliotic curvature convex to the left. 3 mm degenerative anterolisthesis L3-4. 6 mm degenerative anterolisthesis L4-5.  DISC LEVELS: T11-12 and T12-L1: Normal. Small amount of subdural contrast at dorsal T12, not significant. L1-2: Mild bulging of the disc. Mild facet and ligamentous hypertrophy. Mild narrowing of the lateral recesses but no neural compression. L2-3: Mild bulging of the disc. Mild facet and ligamentous hypertrophy. Mild narrowing of the lateral recesses but no neural compression. L3-4: Bilateral facet arthropathy with 3 mm of anterolisthesis. Degeneration mild bulging of the disc. Tiny synovial cyst projecting inward from the facet on the right. Moderate multifactorial spinal stenosis that could cause neural compression on either or both sides. The facet arthropathy could certainly be painful. L4-5: Bilateral facet arthropathy with 6 mm of anterolisthesis. Bulging of the disc. Severe multifactorial stenosis of the canal. Bilateral foraminal stenosis. Neural compression could occur on either side at this level. Facet arthropathy could certainly be painful.  L5-S1: Mild bulging of the disc. Bilateral facet osteoarthritis. No canal or foraminal stenosis. The facet arthropathy could contribute back pain or referred facet syndrome pain.  IMPRESSION: 1. Mild scoliotic curvature convex to the left. 3 mm of degenerative anterolisthesis L3-4. 6 mm degenerative anterolisthesis L4-5. 2. L3-4: Moderate multifactorial spinal stenosis that could cause neural compression on either or both sides. The facet arthropathy could certainly be painful. 3. L4-5: Severe multifactorial spinal stenosis. Bilateral foraminal stenosis. Neural compression could occur on either side at this level. Facet arthropathy could certainly be painful. 4. L5-S1: Facet arthropathy could contribute back pain or referred facet syndrome pain.     Plan of Care  Problem-specific:  No  problem-specific Assessment & Plan notes found for this encounter.  Ms. LADAWN BOULLION has a current medication list which includes the following long-term medication(s): cholestyramine, duloxetine, eliquis, escitalopram, gabapentin, losartan-hydrochlorothiazide, oxycodone, [START ON 06/15/2021] oxycodone, [START ON 07/15/2021] oxycodone, calcium carbonate, and pantoprazole.  Pharmacotherapy (Medications Ordered): No orders of the defined types were placed in this encounter.  Orders:  Orders Placed This Encounter  Procedures   SELECTIVE NERVE ROOT    Standing Status:   Future    Standing Expiration Date:   09/02/2021    Scheduling Instructions:     Side: Right     Level: L3 & L4     Sedation: With Sedation.     Timeframe:  ASAP    Order Specific Question:   Where will this procedure be performed?    Answer:   [facility]   HIP INJECTION    Standing Status:   Future    Standing Expiration Date:   09/02/2021    Scheduling Instructions:     Side: Right-sided     Sedation: With Sedation.     Timeframe: ASAP   Ambulatory referral to Neurosurgery    Referral Priority:   Routine    Referral Type:   Surgical    Referral Reason:   Specialty Services Required    Referred to Provider:   Earnie Larsson, MD    Requested Specialty:   Neurosurgery    Number of Visits Requested:   1   Blood Thinner Instructions to Nursing    Always make sure patient has clearance from prescribing physician to stop blood thinners for interventional therapies. If the patient requires a Lovenox-bridge therapy, make sure arrangements are made to institute it with the assistance of the PCP.    Scheduling Instructions:     Have Ms. Bang stop the Eliquis (Apixaban) x 3 days prior to procedure or surgery.   Follow-up plan:   Return for (Clinic) procedure: (R) L3 & L4 TFESI and/or (R) IA Hip Inj. (Sed-anx), (Blood Thinner Protocol).     Interventional Therapies  Risk   Complexity Considerations:   Estimated  body mass index is 43.6 kg/m as calculated from the following:   Height as of this encounter: _0  (1.651 m).   Weight as of this encounter: 262 lb (118.8 kg). NOTE: Eliquis Anticoagulation: (Stop: 3 days   Restart: 6 hrs)   Planned   Pending:   Therapeutic right L3 and L4 TFESI + right IA hip injection    Under consideration:   Diagnostic bilateral iliopsoas bursa injection #1  Diagnostic midline T12-L1 LESI    Completed by Sharlet Salina, DO:   02/23/2020: RF neurotomy to the bilateral L3-4 and L4-5 facet joints 12/16/2019: Bilateral L4-5 transforaminal ESI (ODI: 64%, one day of relief then return of pain) 11/19/2019: Bilateral L5-S1 transforaminal ESI (ODI 64%, no relief) 10/06/2019: MBB to the bilateral L3-4 and L4-5 facet joints (8/10 to 1/10, continued relief of back pain new pain radiating to the bilateral posterior thighs and occasional anterior thighs) 08/05/2019: MBB to the bilateral L3-4 and L4-5 facet joints (10/10 to 3/10) 06/01/2019: Bilateral L3-4 and L4-5 facet joint injections 06/09/2018: Bilateral L5-S1 transforaminal ESI (mild relief x3 days) 05/11/2018: Bilateral L5-S1 transforaminal ESI (30% relief x7 to 10 days)   Completed:   Diagnostic therapeutic right L4-5 LESI x1 (05/22/2021) (100/100/100/90)  Diagnostic bilateral IA hip joint injection x1 (11/30/2020) (100/100/80/80) Diagnostic bilateral lumbar facet MBB x3 (01/04/2021) (100/50/0/70)  Diagnostic bilateral L3 TFESI x3 (11/07/2020) (100/100/100/100% relief of the bilateral L1/L2 radiculitis)  Diagnostic right L2-3 LESI x1 (07/11/2020) (100/100/100/100% relief of the bilateral L1/L2 radiculitis)    Therapeutic   Palliative (PRN) options:   Therapeutic right L2-3 LESI #2  Therapeutic bilateral L3 TFESI #4  Therapeutic right L4 LESI #2  Diagnostic bilateral lumbar facet MBB #3     Recent Visits Date Type Provider Dept  05/22/21 Procedure visit Milinda Pointer, MD [facility]  05/14/21 Office  Visit Milinda Pointer, MD [facility]  Showing recent visits within past 90 days and meeting all other requirements Today's Visits Date Type Provider Dept  06/05/21 Office Visit Milinda Pointer, MD [facility]  Showing today's visits and meeting all other  requirements Future Appointments Date Type Provider Dept  07/30/21 Appointment Milinda Pointer, MD [facility]  Showing future appointments within next 90 days and meeting all other requirements  I discussed the assessment and treatment plan with the patient. The patient was provided an opportunity to ask questions and all were answered. The patient agreed with the plan and demonstrated an understanding of the instructions.  Patient advised to call back or seek an in-person evaluation if the symptoms or condition worsens.  Duration of encounter: 39 minutes.  Note by: Gaspar Cola, MD Date: 06/05/2021; Time: 2:55 PM

## 2021-06-11 NOTE — Progress Notes (Signed)
PROVIDER NOTE: Interpretation of information contained herein should be left to medically-trained personnel. Specific patient instructions are provided elsewhere under "Patient Instructions" section of medical record. This document was created in part using STT-dictation technology, any transcriptional errors that may result from this process are unintentional.  Patient: Jamie Phillips Type: Established DOB: July 19, 1940 MRN: [id number] PCP: Kirk Ruths, MD  Service: Procedure DOS: 06/12/2021 Setting: Ambulatory Location: Ambulatory outpatient facility Delivery: Face-to-face Provider: Gaspar Cola, MD Specialty: Interventional Pain Management Specialty designation: 09 Location: Outpatient facility Ref. Prov.: Milinda Pointer, MD    Primary Reason for Visit: Interventional Pain Management Treatment. CC: Hip Pain (right)    Procedure #1:  Anesthesia, Analgesia, Anxiolysis:  Type: Intra-Articular Hip Injection #2  Primary Purpose: Therapeutic Region: Posterolateral hip joint area. Level: Lower pelvic and hip joint level. Target Area: Superior aspect of the hip joint cavity, going thru the superior portion of the capsular ligament. Approach: Posterolateral approach. Laterality: Right  Anesthesia: Local (1-2% Lidocaine)  Anxiolysis: None  Sedation: None  Guidance: Fluoroscopy           Position: Left Lateral Decubitus. Area Prepped: Entire Posterolateral hip area.  Area had to be prepped 3 times secondary to the patient reaching back and contaminating the area. DuraPrep (Iodine Povacrylex [0.7% available iodine] and Isopropyl Alcohol, 74% w/w)   Procedure #2:    Type: Gluteofemoral Bursa Injection #2  Primary Purpose: Therapeutic Region: Upper (proximal) Femoral Region Level: Hip Joint Target Area: Superior aspect of the hip joint cavity, going thru the superior portion of the capsular ligament. Approach: Posterolateral approach Laterality: Right     1. Chronic  hip pain (Right)   2. Osteoarthritis of hips (Bilateral)   3. Abnormal MRI, hip (Right) (12/23/2020)   4. Chronic anticoagulation (Eliquis)    NAS-11 Pain score:   Pre-procedure: 8 /10   Post-procedure: 0-No pain/10     Pre-op H&P Assessment:  Jamie Phillips is a 81 y.o. (year old), female patient, seen today for interventional treatment. She  has a past surgical history that includes neck fusion; Wrist surgery (Left, 2000); Cardiac catheterization (N/A, 08/15/2015); IVC FILTER INSERTION (N/A, 06/11/2016); Colonoscopy; Joint replacement (Left, 06/17/2016); Colonoscopy with propofol (N/A, 03/10/2017); IVC FILTER REMOVAL (N/A, 05/06/2017); laparoscopy (N/A, 10/10/2018); laparoscopic appendectomy (N/A, 10/10/2018); Partial colectomy (Right, 10/10/2018); Appendectomy; Esophagogastroduodenoscopy (egd) with propofol (N/A, 02/02/2019); and Breast biopsy (Left, 08/19/04). Jamie Phillips has a current medication list which includes the following prescription(s): acetaminophen, cholestyramine, duloxetine, eliquis, escitalopram, gabapentin, losartan-hydrochlorothiazide, magnesium gluconate, multiple vitamins-minerals, oxycodone, [START ON 06/15/2021] oxycodone, [START ON 07/15/2021] oxycodone, pravastatin, propranolol, sucralfate, calcium carbonate, and pantoprazole. Her primarily concern today is the Hip Pain (right)  Initial Vital Signs:  Pulse/HCG Rate:  (!) 57 ECG Heart Rate: (!) 58 Temp: 98.1 F (36.7 C) Resp: 16 BP: 121/70 SpO2: 98 %  BMI: Estimated body mass index is 41.2 kg/m as calculated from the following:   Height as of this encounter: 5\' 4"  (1.626 m).   Weight as of this encounter: 240 lb (108.9 kg).  Risk Assessment: Allergies: Reviewed. She has No Known Allergies.  Allergy Precautions: None required Coagulopathies: Reviewed. None identified.  Blood-thinner therapy: None at this time Active Infection(s): Reviewed. None identified. Jamie Phillips is afebrile  Site Confirmation: Jamie Phillips was  asked to confirm the procedure and laterality before marking the site Procedure checklist: Completed Consent: Before the procedure and under the influence of no sedative(s), amnesic(s), or anxiolytics, the patient was informed of the treatment options, risks and possible complications. To  fulfill our ethical and legal obligations, as recommended by the American Medical Association's Code of Ethics, I have informed the patient of my clinical impression; the nature and purpose of the treatment or procedure; the risks, benefits, and possible complications of the intervention; the alternatives, including doing nothing; the risk(s) and benefit(s) of the alternative treatment(s) or procedure(s); and the risk(s) and benefit(s) of doing nothing. The patient was provided information about the general risks and possible complications associated with the procedure. These may include, but are not limited to: failure to achieve desired goals, infection, bleeding, organ or nerve damage, allergic reactions, paralysis, and death. In addition, the patient was informed of those risks and complications associated to the procedure, such as failure to decrease pain; infection; bleeding; organ or nerve damage with subsequent damage to sensory, motor, and/or autonomic systems, resulting in permanent pain, numbness, and/or weakness of one or several areas of the body; allergic reactions; (i.e.: anaphylactic reaction); and/or death. Furthermore, the patient was informed of those risks and complications associated with the medications. These include, but are not limited to: allergic reactions (i.e.: anaphylactic or anaphylactoid reaction(s)); adrenal axis suppression; blood sugar elevation that in diabetics may result in ketoacidosis or comma; water retention that in patients with history of congestive heart failure may result in shortness of breath, pulmonary edema, and decompensation with resultant heart failure; weight gain; swelling  or edema; medication-induced neural toxicity; particulate matter embolism and blood vessel occlusion with resultant organ, and/or nervous system infarction; and/or aseptic necrosis of one or more joints. Finally, the patient was informed that Medicine is not an exact science; therefore, there is also the possibility of unforeseen or unpredictable risks and/or possible complications that may result in a catastrophic outcome. The patient indicated having understood very clearly. We have given the patient no guarantees and we have made no promises. Enough time was given to the patient to ask questions, all of which were answered to the patient's satisfaction. Ms. Degraffenreid has indicated that she wanted to continue with the procedure. Attestation: I, the ordering provider, attest that I have discussed with the patient the benefits, risks, side-effects, alternatives, likelihood of achieving goals, and potential problems during recovery for the procedure that I have provided informed consent. Date   Time: 06/12/2021  8:27 AM  Pre-Procedure Preparation:  Monitoring: As per clinic protocol. Respiration, ETCO2, SpO2, BP, heart rate and rhythm monitor placed and checked for adequate function Safety Precautions: Patient was assessed for positional comfort and pressure points before starting the procedure. Time-out: I initiated and conducted the "Time-out" before starting the procedure, as per protocol. The patient was asked to participate by confirming the accuracy of the "Time Out" information. Verification of the correct person, site, and procedure were performed and confirmed by me, the nursing staff, and the patient. "Time-out" conducted as per Joint Commission's Universal Protocol (UP.01.01.01). Time: 0904  Description of Procedure #1:  Safety Precautions: Aspiration looking for blood return was conducted prior to all injections. At no point did we inject any substances, as a needle was being advanced. No  attempts were made at seeking any paresthesias. Safe injection practices and needle disposal techniques used. Medications properly checked for expiration dates. SDV (single dose vial) medications used. Description of the Procedure: Protocol guidelines were followed. The patient was placed in position over the fluoroscopy table. The target area was identified and the area prepped in the usual manner. Skin & deeper tissues infiltrated with local anesthetic. Appropriate amount of time allowed to pass for local  anesthetics to take effect. The procedure needles were then advanced to the target area. Proper needle placement secured. Negative aspiration confirmed. Solution injected in intermittent fashion, asking for systemic symptoms every 0.5cc of injectate. The needles were then removed and the area cleansed, making sure to leave some of the prepping solution back to take advantage of its long term bactericidal properties.  Start Time: 0904 hrs. Materials:  Needle(s) Type: Spinal Needle Gauge: 22G Length: 5.0-in Medication(s): Please see orders for medications and dosing details.  Imaging Guidance (Non-Spinal):          Type of Imaging Technique: Fluoroscopy Guidance (Non-Spinal) Indication(s): Assistance in needle guidance and placement for procedures requiring needle placement in or near specific anatomical locations not easily accessible without such assistance. Exposure Time: Please see nurses notes. Contrast: Before injecting any contrast, we confirmed that the patient did not have an allergy to iodine, shellfish, or radiological contrast. Once satisfactory needle placement was completed at the desired level, radiological contrast was injected. Contrast injected under live fluoroscopy. No contrast complications. See chart for type and volume of contrast used. Fluoroscopic Guidance: I was personally present during the use of fluoroscopy. "Tunnel Vision Technique" used to obtain the best possible view  of the target area. Parallax error corrected before commencing the procedure. "Direction-depth-direction" technique used to introduce the needle under continuous pulsed fluoroscopy. Once target was reached, antero-posterior, oblique, and lateral fluoroscopic projection used confirm needle placement in all planes. Images permanently stored in EMR. Interpretation: I personally interpreted the imaging intraoperatively. Adequate needle placement confirmed in multiple planes. Appropriate spread of contrast into desired area was observed. No evidence of afferent or efferent intravascular uptake. Permanent images saved into the patient's record.   Description of Procedure #2:  Description of the Procedure: Skin & deeper tissues infiltrated with local anesthetic. Appropriate amount of time allowed to pass for local anesthetics to take effect. The procedure needles were then advanced to the target area. Proper needle placement secured. Negative aspiration confirmed. Solution injected in intermittent fashion, asking for systemic symptoms every 0.5cc of injectate. The needles were then removed and the area cleansed, making sure to leave some of the prepping solution back to take advantage of its long term bactericidal properties.  Vitals:   06/12/21 0901 06/12/21 0906 06/12/21 0910 06/12/21 0921  BP: (!) 139/49 (!) 105/45 (!) 104/43 (!) 143/93  Pulse:      Resp: 15 19 17 16   Temp:    98.2 F (36.8 C)  SpO2: 98% 95% 95% 98%  Weight:      Height:         End Time: 0914 hrs.         Materials:  Needle(s) Type: Spinal Needle Gauge: 22G Length: 5.0-in Medication(s): Please see orders for medications and dosing details.  Imaging Guidance (Non-Spinal):          Type of Imaging Technique: Fluoroscopy Guidance (Non-Spinal) Indication(s): Assistance in needle guidance and placement for procedures requiring needle placement in or near specific anatomical locations not easily accessible without such  assistance. Exposure Time: Please see nurses notes. Contrast: Before injecting any contrast, we confirmed that the patient did not have an allergy to iodine, shellfish, or radiological contrast. Once satisfactory needle placement was completed at the desired level, radiological contrast was injected. Contrast injected under live fluoroscopy. No contrast complications. See chart for type and volume of contrast used. Fluoroscopic Guidance: I was personally present during the use of fluoroscopy. "Tunnel Vision Technique" used to obtain the best  possible view of the target area. Parallax error corrected before commencing the procedure. "Direction-depth-direction" technique used to introduce the needle under continuous pulsed fluoroscopy. Once target was reached, antero-posterior, oblique, and lateral fluoroscopic projection used confirm needle placement in all planes. Images permanently stored in EMR. Interpretation: I personally interpreted the imaging intraoperatively. Adequate needle placement confirmed in multiple planes. Appropriate spread of contrast into desired area was observed. No evidence of afferent or efferent intravascular uptake. Permanent images saved into the patient's record.  Antibiotic Prophylaxis:   Anti-infectives (From admission, onward)    None      Indication(s): None identified  Post-operative Assessment:  Post-procedure Vital Signs:  Pulse/HCG Rate:  (!) 57(!) 57 Temp: 98.2 F (36.8 C) Resp: 16 BP: (!) 143/93 SpO2: 98 %  EBL: None  Complications: No immediate post-treatment complications observed by team, or reported by patient.  Note: The patient tolerated the entire procedure well. A repeat set of vitals were taken after the procedure and the patient was kept under observation following institutional policy, for this type of procedure. Post-procedural neurological assessment was performed, showing return to baseline, prior to discharge. The patient was provided  with post-procedure discharge instructions, including a section on how to identify potential problems. Should any problems arise concerning this procedure, the patient was given instructions to immediately contact us, at any time, without hesitation. In any case, we plan to contact the patient by telephone for a follow-up status report regarding this interventional procedure.  Comments:  No additional relevant information.  Plan of Care  Orders:  Orders Placed This Encounter  Procedures   HIP INJECTION    Scheduling Instructions:     Side: Right-sided     Sedation: Patient's choice.     Timeframe: Today   DG PAIN [facility] C-ARM 1-60 MIN NO REPORT    Intraoperative interpretation by procedural physician at [place].    Standing Status:   Standing    Number of Occurrences:   1    Order Specific Question:   Reason for exam:    Answer:   Assistance in needle guidance and placement for procedures requiring needle placement in or near specific anatomical locations not easily accessible without such assistance.   Care order/instruction: Please confirm that the patient has stopped the Eliquis (Apixaban) x 3 days prior to procedure or surgery.    Please confirm that the patient has stopped the Eliquis (Apixaban) x 3 days prior to procedure or surgery.    Standing Status:   Standing    Number of Occurrences:   1   Informed Consent Details: Physician/Practitioner Attestation; Transcribe to consent form and obtain patient signature    Nursing Order: Transcribe to consent form and obtain patient signature. Note: Always confirm laterality of pain with Ms. Hardin Negus, before procedure.    Order Specific Question:   Physician/Practitioner attestation of informed consent for procedure/surgical case    Answer:   I, the physician/practitioner, attest that I have discussed with the patient the benefits, risks, side effects, alternatives, likelihood of achieving goals and potential problems during  recovery for the procedure that I have provided informed consent.    Order Specific Question:   Procedure    Answer:   Hip injection    Order Specific Question:   Physician/Practitioner performing the procedure    Answer:   Saki Legore A. Dossie Arbour, MD    Order Specific Question:   Indication/Reason    Answer:   Hip Joint Pain (Arthralgia)   Provide equipment /  supplies at bedside    "Block Tray" (Disposable   single use) Needle type: SpinalSpinal Amount/quantity: 1 Size: Long (7-inch) Gauge: 22G    Standing Status:   Standing    Number of Occurrences:   1    Order Specific Question:   Specify    Answer:   Block Tray   Bleeding precautions    Standing Status:   Standing    Number of Occurrences:   1   Chronic Opioid Analgesic:  Tramadol 50 mg, 1 tab p.o. 4 times daily (200 mg/day of tramadol) MME/day: 20 mg/day   Medications ordered for procedure: Meds ordered this encounter  Medications   lidocaine (XYLOCAINE) 2 % (with pres) injection 400 mg   lactated ringers infusion 1,000 mL   midazolam (VERSED) 5 MG/5ML injection 0.5-2 mg    Make sure Flumazenil is available in the pyxis when using this medication. If oversedation occurs, administer 0.2 mg IV over 15 sec. If after 45 sec no response, administer 0.2 mg again over 1 min; may repeat at 1 min intervals; not to exceed 4 doses (1 mg)   iohexol (OMNIPAQUE) 180 MG/ML injection 10 mL    Must be Myelogram-compatible. If not available, you may substitute with a water-soluble, non-ionic, hypoallergenic, myelogram-compatible radiological contrast medium.   methylPREDNISolone acetate (DEPO-MEDROL) injection 80 mg   ropivacaine (PF) 2 mg/mL (0.2%) (NAROPIN) injection 9 mL   Medications administered: We administered lidocaine, lactated ringers, midazolam, iohexol, methylPREDNISolone acetate, and ropivacaine (PF) 2 mg/mL (0.2%).  See the medical record for exact dosing, route, and time of administration.  Follow-up plan:   Return in about 2  weeks (around 06/26/2021) for Proc-day (T,Th), (F2F), (PPE).       Interventional Therapies  Risk   Complexity Considerations:   Estimated body mass index is 43.6 kg/m as calculated from the following:   Height as of this encounter: 5\' 5"  (1.651 m).   Weight as of this encounter: 262 lb (118.8 kg). NOTE: Eliquis Anticoagulation: (Stop: 3 days   Restart: 6 hrs)   Planned   Pending:   Therapeutic right L3 and L4 TFESI + right IA hip injection    Under consideration:   Diagnostic bilateral iliopsoas bursa injection #1  Diagnostic midline T12-L1 LESI    Completed by Sharlet Salina, DO:   02/23/2020: RF neurotomy to the bilateral L3-4 and L4-5 facet joints 12/16/2019: Bilateral L4-5 transforaminal ESI (ODI: 64%, one day of relief then return of pain) 11/19/2019: Bilateral L5-S1 transforaminal ESI (ODI 64%, no relief) 10/06/2019: MBB to the bilateral L3-4 and L4-5 facet joints (8/10 to 1/10, continued relief of back pain new pain radiating to the bilateral posterior thighs and occasional anterior thighs) 08/05/2019: MBB to the bilateral L3-4 and L4-5 facet joints (10/10 to 3/10) 06/01/2019: Bilateral L3-4 and L4-5 facet joint injections 06/09/2018: Bilateral L5-S1 transforaminal ESI (mild relief x3 days) 05/11/2018: Bilateral L5-S1 transforaminal ESI (30% relief x7 to 10 days)   Completed:   Diagnostic therapeutic right L4-5 LESI x1 (05/22/2021) (100/100/100/90)  Diagnostic bilateral IA hip joint injection x1 (11/30/2020) (100/100/80/80) Diagnostic bilateral lumbar facet MBB x3 (01/04/2021) (100/50/0/70)  Diagnostic bilateral L3 TFESI x3 (11/07/2020) (100/100/100/100% relief of the bilateral L1/L2 radiculitis)  Diagnostic right L2-3 LESI x1 (07/11/2020) (100/100/100/100% relief of the bilateral L1/L2 radiculitis)    Therapeutic   Palliative (PRN) options:   Therapeutic right L2-3 LESI #2  Therapeutic bilateral L3 TFESI #4  Therapeutic right L4 LESI #2  Diagnostic bilateral lumbar facet  MBB #3  Recent Visits Date Type Provider Dept  06/05/21 Office Visit Milinda Pointer, MD [facility]  05/22/21 Procedure visit Milinda Pointer, MD [facility]  05/14/21 Office Visit Milinda Pointer, MD [facility]  Showing recent visits within past 90 days and meeting all other requirements Today's Visits Date Type Provider Dept  06/12/21 Procedure visit Milinda Pointer, MD [facility]  Showing today's visits and meeting all other requirements Future Appointments Date Type Provider Dept  06/26/21 Appointment Milinda Pointer, MD [facility]  07/30/21 Appointment Milinda Pointer, MD [facility]  Showing future appointments within next 90 days and meeting all other requirements  Disposition: Discharge home  Discharge (Date   Time): 06/12/2021; 0924 hrs.   Primary Care Physician: Kirk Ruths, MD Location: [facility] Note by: Gaspar Cola, MD Date: 06/12/2021; Time: 10:12 AM  Disclaimer:  Medicine is not an Chief Strategy Officer. The only guarantee in medicine is that nothing is guaranteed. It is important to note that the decision to proceed with this intervention was based on the information collected from the patient. The Data and conclusions were drawn from the patient's questionnaire, the interview, and the physical examination. Because the information was provided in large part by the patient, it cannot be guaranteed that it has not been purposely or unconsciously manipulated. Every effort has been made to obtain as much relevant data as possible for this evaluation. It is important to note that the conclusions that lead to this procedure are derived in large part from the available data. Always take into account that the treatment will also be dependent on availability of resources and existing treatment guidelines, considered by other Pain Management Practitioners as  being common knowledge and practice, at the time of the intervention. For Medico-Legal purposes, it is also important to point out that variation in procedural techniques and pharmacological choices are the acceptable norm. The indications, contraindications, technique, and results of the above procedure should only be interpreted and judged by a Board-Certified Interventional Pain Specialist with extensive familiarity and expertise in the same exact procedure and technique.

## 2021-06-12 ENCOUNTER — Ambulatory Visit
Admission: RE | Admit: 2021-06-12 | Discharge: 2021-06-12 | Disposition: A | Discharge status: Home / Self Care | Payer: Medicare HMO | Source: Ambulatory Visit | Attending: Pain Medicine | Admitting: Pain Medicine

## 2021-06-12 ENCOUNTER — Ambulatory Visit (HOSPITAL_BASED_OUTPATIENT_CLINIC_OR_DEPARTMENT_OTHER): Payer: Medicare HMO | Admitting: Pain Medicine

## 2021-06-12 ENCOUNTER — Telehealth: Payer: Self-pay | Admitting: Pain Medicine

## 2021-06-12 ENCOUNTER — Other Ambulatory Visit: Payer: Self-pay

## 2021-06-12 VITALS — BP 143/93 | HR 57 | Temp 98.2°F | Resp 16 | Ht 64.0 in | Wt 240.0 lb

## 2021-06-12 DIAGNOSIS — M4316 Spondylolisthesis, lumbar region: Secondary | ICD-10-CM

## 2021-06-12 DIAGNOSIS — G8929 Other chronic pain: Secondary | ICD-10-CM

## 2021-06-12 DIAGNOSIS — R935 Abnormal findings on diagnostic imaging of other abdominal regions, including retroperitoneum: Secondary | ICD-10-CM | POA: Insufficient documentation

## 2021-06-12 DIAGNOSIS — Z7901 Long term (current) use of anticoagulants: Secondary | ICD-10-CM | POA: Insufficient documentation

## 2021-06-12 DIAGNOSIS — M160 Bilateral primary osteoarthritis of hip: Secondary | ICD-10-CM | POA: Insufficient documentation

## 2021-06-12 DIAGNOSIS — M48061 Spinal stenosis, lumbar region without neurogenic claudication: Secondary | ICD-10-CM

## 2021-06-12 DIAGNOSIS — M25551 Pain in right hip: Secondary | ICD-10-CM

## 2021-06-12 DIAGNOSIS — M5137 Other intervertebral disc degeneration, lumbosacral region: Secondary | ICD-10-CM

## 2021-06-12 MED ORDER — METHYLPREDNISOLONE ACETATE 80 MG/ML IJ SUSP
80.0000 mg | Freq: Once | INTRAMUSCULAR | Status: AC
  Administered 2021-06-12: 80 mg via INTRA_ARTICULAR

## 2021-06-12 MED ORDER — MIDAZOLAM HCL 5 MG/5ML IJ SOLN
0.5000 mg | Freq: Once | INTRAMUSCULAR | Status: AC
  Administered 2021-06-12: 1.5 mg via INTRAVENOUS

## 2021-06-12 MED ORDER — IOHEXOL 180 MG/ML  SOLN
10.0000 mL | Freq: Once | INTRAMUSCULAR | Status: AC
  Administered 2021-06-12: 5 mL via INTRA_ARTICULAR

## 2021-06-12 MED ORDER — LIDOCAINE HCL 2 % IJ SOLN
20.0000 mL | Freq: Once | INTRAMUSCULAR | Status: AC
  Administered 2021-06-12: 400 mg

## 2021-06-12 MED ORDER — ROPIVACAINE HCL 2 MG/ML IJ SOLN
9.0000 mL | Freq: Once | INTRAMUSCULAR | Status: AC
  Administered 2021-06-12: 20 mL via INTRA_ARTICULAR

## 2021-06-12 MED ORDER — LACTATED RINGERS IV SOLN
1000.0000 mL | Freq: Once | INTRAVENOUS | Status: AC
  Administered 2021-06-12: 1000 mL via INTRAVENOUS

## 2021-06-12 MED ORDER — LIDOCAINE HCL 2 % IJ SOLN
INTRAMUSCULAR | Status: AC
  Filled 2021-06-12: qty 20

## 2021-06-12 MED ORDER — METHYLPREDNISOLONE ACETATE 80 MG/ML IJ SUSP
INTRAMUSCULAR | Status: AC
  Filled 2021-06-12: qty 1

## 2021-06-12 MED ORDER — MIDAZOLAM HCL 5 MG/5ML IJ SOLN
INTRAMUSCULAR | Status: AC
  Filled 2021-06-12: qty 5

## 2021-06-12 MED ORDER — ROPIVACAINE HCL 2 MG/ML IJ SOLN
INTRAMUSCULAR | Status: AC
  Filled 2021-06-12: qty 20

## 2021-06-12 NOTE — Patient Instructions (Signed)

## 2021-06-12 NOTE — Progress Notes (Signed)
Safety precautions to be maintained throughout the outpatient stay will include: orient to surroundings, keep bed in low position, maintain call bell within reach at all times, provide assistance with transfer out of bed and ambulation.  

## 2021-06-13 ENCOUNTER — Telehealth: Payer: Self-pay

## 2021-06-13 DIAGNOSIS — M48062 Spinal stenosis, lumbar region with neurogenic claudication: Secondary | ICD-10-CM | POA: Diagnosis not present

## 2021-06-13 DIAGNOSIS — M4310 Spondylolisthesis, site unspecified: Secondary | ICD-10-CM | POA: Diagnosis not present

## 2021-06-13 NOTE — Telephone Encounter (Signed)
Post procedure phone call. Patient states she is doing good.  

## 2021-06-18 ENCOUNTER — Other Ambulatory Visit: Payer: Self-pay

## 2021-06-18 ENCOUNTER — Ambulatory Visit (INDEPENDENT_AMBULATORY_CARE_PROVIDER_SITE_OTHER): Payer: Medicare HMO | Admitting: Family Medicine

## 2021-06-18 ENCOUNTER — Encounter (INDEPENDENT_AMBULATORY_CARE_PROVIDER_SITE_OTHER): Payer: Self-pay | Admitting: Family Medicine

## 2021-06-18 VITALS — BP 108/72 | HR 64 | Temp 98.0°F | Ht 64.0 in | Wt 236.0 lb

## 2021-06-18 DIAGNOSIS — E669 Obesity, unspecified: Secondary | ICD-10-CM | POA: Diagnosis not present

## 2021-06-18 DIAGNOSIS — I10 Essential (primary) hypertension: Secondary | ICD-10-CM | POA: Diagnosis not present

## 2021-06-18 DIAGNOSIS — Z6841 Body Mass Index (BMI) 40.0 and over, adult: Secondary | ICD-10-CM

## 2021-06-18 DIAGNOSIS — G2581 Restless legs syndrome: Secondary | ICD-10-CM | POA: Diagnosis not present

## 2021-06-18 MED ORDER — LOSARTAN POTASSIUM-HCTZ 100-12.5 MG PO TABS: 0.5000 | ORAL_TABLET | Freq: Every day | ORAL | Status: DC

## 2021-06-18 NOTE — Progress Notes (Signed)
Chief Complaint:   OBESITY Jamie Phillips is here to discuss her progress with her obesity treatment plan along with follow-up of her obesity related diagnoses. Jamie Phillips is on the Category 2 Plan and keeping a food journal and adhering to recommended goals of 500-600 calories and 40+ grams protein and states she is following her eating plan approximately 50% of the time. Jamie Phillips states she is not currently exercising.  Today's visit was #: 9 Starting weight: 257 lbs Starting date: 12/26/2020 Today's weight: 236 lbs Today's date: 06/18/2021 Total lbs lost to date: 21 Total lbs lost since last in-office visit: 10  Interim History: Jamie Phillips is supposed to go to see a back surgeon and has an MRI scheduled for 2 days from now. She did start using cream on her legs for RLS. She is getting to see neurosurgery in a week and a half. Pt is getting tired of chicken and salad and eggs. She is eating quite a bit of fruit like melons and blueberries and yogurt and vegetables.  Subjective:   1. Essential hypertension BP low on automatic cuff and low normal manual. Pt denies dizziness/lightheadedness but notes fatigue.  2. RLS (restless legs syndrome) Pt is using Magonate cream (homeopathic) with some improvement in sxs.  Assessment/Plan:   1. Essential hypertension Jamie Phillips is working on healthy weight loss and exercise to improve blood pressure control. We will watch for signs of hypotension as she continues her lifestyle modifications. Decrease Hyzaar to 1/2 tab daily. F/u on BP at next appt.  2. RLS (restless legs syndrome) F/u on sxs at next appt.  3. Obesity with current BMI of 40.5 Jamie Phillips is currently in the action stage of change. As such, her goal is to continue with weight loss efforts. She has agreed to the Category 2 Plan.   Exercise goals:  As is  Behavioral modification strategies: increasing lean protein intake, meal planning and cooking strategies, keeping healthy foods in the home, and planning for  success.  Jamie Phillips has agreed to follow-up with our clinic in 3-4 weeks. She was informed of the importance of frequent follow-up visits to maximize her success with intensive lifestyle modifications for her multiple health conditions.   Objective:   Blood pressure 108/72, pulse 64, temperature 98 F (36.7 C), height 5\' 4"  (1.626 m), weight 236 lb (107 kg), SpO2 93 %. Body mass index is 40.51 kg/m.  General: Cooperative, alert, well developed, in no acute distress. HEENT: Conjunctivae and lids unremarkable. Cardiovascular: Regular rhythm.  Lungs: Normal work of breathing. Neurologic: No focal deficits.   Lab Results  Component Value Date   CREATININE 0.98 03/30/2021   BUN 24 (H) 03/30/2021   NA 140 03/30/2021   K 3.3 (L) 03/30/2021   CL 101 03/30/2021   CO2 30 03/30/2021   Lab Results  Component Value Date   ALT 17 03/30/2021   AST 19 03/30/2021   ALKPHOS 81 03/30/2021   BILITOT 0.4 03/30/2021   Lab Results  Component Value Date   HGBA1C 5.8 (H) 12/26/2020   Lab Results  Component Value Date   INSULIN 14.4 12/26/2020   Lab Results  Component Value Date   TSH 3.590 12/26/2020   Lab Results  Component Value Date   CHOL 240 (H) 12/26/2020   HDL 59 12/26/2020   LDLCALC 145 (H) 12/26/2020   TRIG 202 (H) 12/26/2020   Lab Results  Component Value Date   VD25OH 28.9 (L) 12/26/2020   Lab Results  Component Value Date  WBC 15.0 (H) 03/30/2021   HGB 13.1 03/30/2021   HCT 40.5 03/30/2021   MCV 100.5 (H) 03/30/2021   PLT 253 03/30/2021   Lab Results  Component Value Date   FERRITIN 86 12/18/2018    Attestation Statements:   Reviewed by clinician on day of visit: allergies, medications, problem list, medical history, surgical history, family history, social history, and previous encounter notes.  Coral Ceo, CMA, am acting as transcriptionist for Coralie Common, MD.  I have reviewed the above documentation for accuracy and completeness, and I agree  with the above. - Coralie Common, MD

## 2021-06-20 DIAGNOSIS — M4310 Spondylolisthesis, site unspecified: Secondary | ICD-10-CM | POA: Diagnosis not present

## 2021-06-20 DIAGNOSIS — M545 Low back pain, unspecified: Secondary | ICD-10-CM | POA: Diagnosis not present

## 2021-06-20 DIAGNOSIS — R29898 Other symptoms and signs involving the musculoskeletal system: Secondary | ICD-10-CM | POA: Diagnosis not present

## 2021-06-26 ENCOUNTER — Encounter: Payer: Self-pay | Admitting: Pain Medicine

## 2021-06-26 ENCOUNTER — Ambulatory Visit: Payer: Medicare HMO | Attending: Pain Medicine | Admitting: Pain Medicine

## 2021-06-26 ENCOUNTER — Other Ambulatory Visit: Payer: Self-pay

## 2021-06-26 VITALS — BP 91/79 | HR 69 | Temp 97.1°F | Resp 18 | Ht 64.0 in | Wt 243.0 lb

## 2021-06-26 DIAGNOSIS — R94131 Abnormal electromyogram [EMG]: Secondary | ICD-10-CM | POA: Insufficient documentation

## 2021-06-26 DIAGNOSIS — M4310 Spondylolisthesis, site unspecified: Secondary | ICD-10-CM | POA: Insufficient documentation

## 2021-06-26 DIAGNOSIS — M25551 Pain in right hip: Secondary | ICD-10-CM | POA: Insufficient documentation

## 2021-06-26 DIAGNOSIS — M5136 Other intervertebral disc degeneration, lumbar region: Secondary | ICD-10-CM | POA: Diagnosis not present

## 2021-06-26 DIAGNOSIS — M5441 Lumbago with sciatica, right side: Secondary | ICD-10-CM | POA: Insufficient documentation

## 2021-06-26 DIAGNOSIS — M160 Bilateral primary osteoarthritis of hip: Secondary | ICD-10-CM | POA: Insufficient documentation

## 2021-06-26 DIAGNOSIS — M5137 Other intervertebral disc degeneration, lumbosacral region: Secondary | ICD-10-CM | POA: Insufficient documentation

## 2021-06-26 DIAGNOSIS — G8929 Other chronic pain: Secondary | ICD-10-CM | POA: Diagnosis not present

## 2021-06-26 DIAGNOSIS — R935 Abnormal findings on diagnostic imaging of other abdominal regions, including retroperitoneum: Secondary | ICD-10-CM | POA: Diagnosis present

## 2021-06-26 DIAGNOSIS — I10 Essential (primary) hypertension: Secondary | ICD-10-CM | POA: Diagnosis not present

## 2021-06-26 DIAGNOSIS — M79605 Pain in left leg: Secondary | ICD-10-CM | POA: Insufficient documentation

## 2021-06-26 DIAGNOSIS — Z6841 Body Mass Index (BMI) 40.0 and over, adult: Secondary | ICD-10-CM | POA: Diagnosis not present

## 2021-06-26 DIAGNOSIS — M5442 Lumbago with sciatica, left side: Secondary | ICD-10-CM | POA: Insufficient documentation

## 2021-06-26 DIAGNOSIS — M4316 Spondylolisthesis, lumbar region: Secondary | ICD-10-CM | POA: Insufficient documentation

## 2021-06-26 DIAGNOSIS — M47816 Spondylosis without myelopathy or radiculopathy, lumbar region: Secondary | ICD-10-CM | POA: Insufficient documentation

## 2021-06-26 DIAGNOSIS — M7138 Other bursal cyst, other site: Secondary | ICD-10-CM | POA: Insufficient documentation

## 2021-06-26 DIAGNOSIS — M79604 Pain in right leg: Secondary | ICD-10-CM | POA: Insufficient documentation

## 2021-06-26 DIAGNOSIS — M48062 Spinal stenosis, lumbar region with neurogenic claudication: Secondary | ICD-10-CM | POA: Diagnosis not present

## 2021-06-26 DIAGNOSIS — R937 Abnormal findings on diagnostic imaging of other parts of musculoskeletal system: Secondary | ICD-10-CM | POA: Insufficient documentation

## 2021-06-26 DIAGNOSIS — M5417 Radiculopathy, lumbosacral region: Secondary | ICD-10-CM | POA: Diagnosis present

## 2021-06-26 DIAGNOSIS — M48061 Spinal stenosis, lumbar region without neurogenic claudication: Secondary | ICD-10-CM | POA: Diagnosis not present

## 2021-06-26 NOTE — Progress Notes (Signed)
Safety precautions to be maintained throughout the outpatient stay will include: orient to surroundings, keep bed in low position, maintain call bell within reach at all times, provide assistance with transfer out of bed and ambulation.  

## 2021-06-26 NOTE — Progress Notes (Signed)
PROVIDER NOTE: Information contained herein reflects review and annotations entered in association with encounter. Interpretation of such information and data should be left to medically-trained personnel. Information provided to patient can be located elsewhere in the medical record under "Patient Instructions". Document created using STT-dictation technology, any transcriptional errors that may result from process are unintentional.    Patient: Jamie Phillips  Service Category: E/M  Provider: Gaspar Cola, MD  DOB: December 12, 1940  DOS: 06/26/2021  Specialty: Interventional Pain Management  MRN: [id number]  Setting: Ambulatory outpatient  PCP: Kirk Ruths, MD  Type: Established Patient    Referring Provider: Kirk Ruths, MD  Location: Office  Delivery: Face-to-face     HPI  Ms. Jamie Phillips, a 81 y.o. year old female, is here today because of her Chronic pain of lower extremity, bilateral [M79.604, M79.605, G89.29]. Ms. Schuitema primary complain today is Hip Pain (right) Last encounter: My last encounter with her was on 06/12/2021. Pertinent problems: 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); Abnormal MRI, lumbar spine (01/24/2020); Lumbar facet syndrome (Multilevel) (Bilateral); Lumbar facet hypertrophy (Multilevel) (Bilateral); Severe muscle deconditioning; Lower extremity weakness  (Bilateral); Lumbosacral facet hypertrophy (Multilevel) (Bilateral); Lumbar scoliosis (idiopathic); Spondylolisthesis of lumbosacral region (Multilevel); Retrolisthesis (L1/L2); Anterolisthesis of lumbar spine (L3/L4, L4/L5); Anterolisthesis of lumbosacral spine (L5/S1); Lumbar lateral recess stenosis (Bilateral: L3-4, L4-5) (Severe); Lumbar foraminal stenosis (Right: L4-5); Synovial cyst of lumbar facet joint (L3-4) (Right); Arthropathy of spinal facet joint concurrent with and due to effusion (L3-4); Annular tear of lumbar disc (L3-4) (Left); Chronic lower extremity radicular pain (L1/L2 dermatomes) (Bilateral); Chronic groin pain (Bilateral); Neurogenic pain, lower extremity (Bilateral); Shooting pain, intermittent (electrical-like); History of total knee replacement (Bilateral) ; Spondylosis without myelopathy or radiculopathy, lumbosacral region; Chronic low back pain (2ry area of Pain) (Bilateral) w/o sciatica; Abnormal EMG (electromyogram) (05/09/2020); Abnormal CT scan, lumbar spine (06/16/2020); Chronic hip pain (Bilateral); Trochanteric bursitis of hips (Bilateral); Iliopsoas bursitis of hips; Decreased range of motion of lumbar spine; Osteoarthritis of hips (Bilateral); Sacroiliac joint dysfunction of right side; Chronic right sacroiliac joint pain; 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[place]); Chronic hip pain (Right); Primary osteoarthritis of hip (Right); and Abnormal MRI, hip (Right) (12/23/2020) on their pertinent problem list. Pain Assessment: Severity of Chronic pain is reported as a 5 /10. Location: Hip Right/radiates down right leg to mid thigh. Onset: More than a month ago. Quality: Aching, Burning, Sharp, Nagging. Timing: Constant. Modifying factor(s): rest, meds. Vitals:  height is '5\' 4"'  (1.626 m) and weight is 243 lb (110.2 kg). Her temperature is 97.1 F (36.2 C) (abnormal). Her blood pressure is 91/79 and her pulse is 69. Her respiration is 18 and oxygen saturation is 96%.   Reason for  encounter: post-procedure evaluation and assessment.  According to the patient the right intra-articular hip joint injection and clinical femoral bursa injection did provide her with 100% relief of the pain for the duration of the local anesthetic which after 6 hours went down to a 75% improvement.  Unfortunately, this also wore off.  From the  clinical standpoint, we have that she does have some issues with her right hip joint as seen by her MRI, but she also has issues with her lumbar spine that may also affect sensation on both of her lower extremities including the area of the hips.  She is pending to be seen by Dr. Trenton Gammon in [facility], today at 4:00 PM. Had MRI done of Lumbar spine, last Wednesday at their office. ([facility]). (Results not available.)  At this point, the plan is to let her be evaluated by Dr. Trenton Gammon so that we can get a fresh set of eyes and a second opinion as to what may be going on and how they may be able to help her.  Post-procedure evaluation     Procedure #1:  Anesthesia, Analgesia, Anxiolysis:  Type: Intra-Articular Hip Injection #2  Primary Purpose: Therapeutic Region: Posterolateral hip joint area. Level: Lower pelvic and hip joint level. Target Area: Superior aspect of the hip joint cavity, going thru the superior portion of the capsular ligament. Approach: Posterolateral approach. Laterality: Right  Anesthesia: Local (1-2% Lidocaine)  Anxiolysis: None  Sedation: None  Guidance: Fluoroscopy           Position: Left Lateral Decubitus. Area Prepped: Entire Posterolateral hip area.  Area had to be prepped 3 times secondary to the patient reaching back and contaminating the area. DuraPrep (Iodine Povacrylex [0.7% available iodine] and Isopropyl Alcohol, 74% w/w)   Procedure #2:    Type: Gluteofemoral Bursa Injection #2  Primary Purpose: Therapeutic Region: Upper (proximal) Femoral Region Level: Hip Joint Target Area: Superior aspect of the hip joint cavity, going  thru the superior portion of the capsular ligament. Approach: Posterolateral approach Laterality: Right     1. Chronic hip pain (Right)   2. Osteoarthritis of hips (Bilateral)   3. Abnormal MRI, hip (Right) (12/23/2020)   4. Chronic anticoagulation (Eliquis)    NAS-11 Pain score:   Pre-procedure: 8 /10   Post-procedure: 0-No pain/10     Effectiveness:  Initial hour after procedure: 100 %. Subsequent 4-6 hours post-procedure: 100 % for the first 24 hours. Analgesia past initial 6 hours: 75 %. Ongoing improvement:  Analgesic: The patient indicates that the pain has returned, exactly the same as it was before. Function: Back to baseline ROM: Back to baseline  Pharmacotherapy Assessment  Analgesic: Tramadol 50 mg, 1 tab p.o. 4 times daily (200 mg/day of tramadol) MME/day: 20 mg/day   Monitoring: [place] PMP: PDMP reviewed during this encounter.       Pharmacotherapy: No side-effects or adverse reactions reported. Compliance: No problems identified. Effectiveness: Clinically acceptable.  Dewayne Shorter, RN  06/26/2021  2:12 PM  Sign when Signing Visit Safety precautions to be maintained throughout the outpatient stay will include: orient to surroundings, keep bed in low position, maintain call bell within reach at all times, provide assistance with transfer out of bed and ambulation.    UDS:  Summary  Date Value Ref Range Status  07/27/2020 Note  Final    Comment:    ==================================================================== [employer] 13 (MW) ==================================================================== Test                             Result       Flag       Units  Drug Present and Declared for Prescription Verification   Oxycodone  1206         EXPECTED   ng/mg creat   Oxymorphone                    90           EXPECTED   ng/mg creat   Noroxycodone                   2368         EXPECTED   ng/mg creat    Sources of oxycodone include  scheduled prescription medications.    Oxymorphone and noroxycodone are expected metabolites of oxycodone.    Oxymorphone is also available as a scheduled prescription medication.  ==================================================================== Test                      Result    Flag   Units      Ref Range   Creatinine              212              mg/dL      >=20 ==================================================================== Declared Medications:  The flagging and interpretation on this report are based on the  following declared medications.  Unexpected results may arise from  inaccuracies in the declared medications.   **Note: The testing scope of this panel includes these medications:   Oxycodone   **Note: The testing scope of this panel does not include the  following reported medications:   Acetaminophen (Tylenol)  Albuterol (Ventolin HFA)  Apixaban (Eliquis)  Cholestyramine (Questran)  Duloxetine (Cymbalta)  Escitalopram (Lexapro)  Gabapentin (Neurontin)  Hydrochlorothiazide (Hyzaar)  Losartan (Hyzaar)  Pantoprazole (Protonix)  Pravastatin (Pravachol)  Sucralfate (Carafate) ==================================================================== For clinical consultation, please call [phone]. ====================================================================      ROS  Constitutional: Denies any fever or chills Gastrointestinal: No reported hemesis, hematochezia, vomiting, or acute GI distress Musculoskeletal: Denies any acute onset joint swelling, redness, loss of ROM, or weakness Neurological: No reported episodes of acute onset apraxia, aphasia, dysarthria, agnosia, amnesia, paralysis, loss of coordination, or loss of consciousness  Medication Review  DULoxetine, Multiple Vitamins-Minerals, acetaminophen, apixaban, calcium carbonate, cholestyramine, escitalopram, gabapentin, losartan-hydrochlorothiazide, magnesium gluconate, oxyCODONE,  pantoprazole, pravastatin, propranolol, and sucralfate  History Review  Allergy: Ms. Corbridge has No Known Allergies. Drug: Ms. Langstaff  reports no history of drug use. Alcohol:  reports no history of alcohol use. Tobacco:  reports that she has never smoked. She has never used smokeless tobacco. Social: Ms. Gervais  reports that she has never smoked. She has never used smokeless tobacco. She reports that she does not drink alcohol and does not use drugs. 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 Surgical: Ms. Lavalle  has a past surgical history that includes neck fusion; Wrist surgery (Left, 2000); Cardiac catheterization (N/A, 08/15/2015); IVC FILTER INSERTION (N/A, 06/11/2016); Colonoscopy; Joint replacement (Left, 06/17/2016); Colonoscopy with propofol (N/A, 03/10/2017); IVC FILTER REMOVAL (N/A, 05/06/2017); laparoscopy (N/A, 10/10/2018); laparoscopic appendectomy (N/A, 10/10/2018); Partial colectomy (Right, 10/10/2018); Appendectomy; Esophagogastroduodenoscopy (egd) with propofol (N/A, 02/02/2019); and Breast biopsy (Left, 08/19/04). Family: family history includes Congestive Heart Failure  in her father; Heart failure in her father; Obesity in her mother; Pulmonary embolism in her mother; Sudden death in her father and mother.  Laboratory Chemistry Profile   Renal Lab Results  Component Value Date   BUN 24 (H) 03/30/2021   CREATININE 0.98 03/30/2021   BCR 17  12/26/2020   GFRAA 55 (L) 09/03/2019   GFRNONAA 59 (L) 03/30/2021    Hepatic Lab Results  Component Value Date   AST 19 03/30/2021   ALT 17 03/30/2021   ALBUMIN 3.8 03/30/2021   ALKPHOS 81 03/30/2021   LIPASE 37 10/09/2018    Electrolytes Lab Results  Component Value Date   NA 140 03/30/2021   K 3.3 (L) 03/30/2021   CL 101 03/30/2021   CALCIUM 9.0 03/30/2021   MG 1.9 06/21/2020    Bone Lab Results  Component Value Date   VD25OH 28.9 (L) 12/26/2020   25OHVITD1 14 (L) 06/21/2020   25OHVITD2 <1.0 06/21/2020   25OHVITD3 14 06/21/2020    Inflammation (CRP: Acute Phase) (ESR: Chronic Phase) Lab Results  Component Value Date   CRP 9 06/21/2020   ESRSEDRATE 21 06/21/2020   LATICACIDVEN 0.9 12/13/2018         Note: Above Lab results reviewed.  Recent Imaging Review  DG PAIN CLINIC C-ARM 1-60 MIN NO REPORT Fluoro was used, but no Radiologist interpretation will be provided.  Please refer to "NOTES" tab for provider progress note. Note: Reviewed        Physical Exam  General appearance: Well nourished, well developed, and well hydrated. In no apparent acute distress Mental status: Alert, oriented x 3 (person, place, & time)       Respiratory: No evidence of acute respiratory distress Eyes: PERLA Vitals: BP 91/79 (BP Location: Right Arm, Patient Position: Sitting, Cuff Size: Large)    Pulse 69    Temp (!) 97.1 F (36.2 C)    Resp 18    Ht '5\' 4"'  (1.626 m)    Wt 243 lb (110.2 kg)    SpO2 96%    BMI 41.71 kg/m  BMI: Estimated body mass index is 41.71 kg/m as calculated from the following:   Height as of this encounter: '5\' 4"'  (1.626 m).   Weight as of this encounter: 243 lb (110.2 kg). Ideal: Ideal body weight: 54.7 kg (120 lb 9.5 oz) Adjusted ideal body weight: 76.9 kg (169 lb 8.9 oz)  Assessment   Status Diagnosis  Controlled Controlled Controlled 1. Chronic lower extremity pain (1ry area of Pain) (Bilateral)   2. Chronic low back pain (Bilateral) w/ sciatica  (Bilateral)   3. Retrolisthesis (L1/L2)   4. Annular tear of lumbar disc (L3-4) (Left)   5. Anterolisthesis of lumbar spine (L3/L4, L4/L5)   6. DDD (degenerative disc disease), lumbosacral   7. 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 Lumbar facet hypertrophy (Multilevel) (Bilateral)   9. Lumbar foraminal stenosis (Right: L4-5)   10. Lumbar lateral recess stenosis (Bilateral: L3-4, L4-5) (Severe)   11. Lumbosacral radiculopathy at L1 (Bilateral)   12. Lumbosacral radiculopathy at L5 (Left)   13. Lumbosacral radiculopathy at S1 (Right)   14. Synovial cyst of lumbar facet joint (L3-4) (Right)   15. Chronic hip pain (Right)   16. Osteoarthritis of hips (Bilateral)   17. Abnormal MRI, hip (Right) (12/23/2020)   18. Abnormal MRI, lumbar spine (01/24/2020)   19. Abnormal CT scan, lumbar spine (06/16/2020)   20. Abnormal EMG (electromyogram) (05/09/2020)  Updated Problems: No problems updated.  Plan of Care  Problem-specific:  No problem-specific Assessment & Plan notes found for this encounter.  Ms. DESARAE PLACIDE has a current medication list which includes the following long-term medication(s): calcium carbonate, cholestyramine, duloxetine, eliquis, escitalopram, gabapentin, losartan-hydrochlorothiazide, oxycodone, oxycodone, [START ON 07/15/2021] oxycodone, and pantoprazole.  Pharmacotherapy (Medications Ordered): No orders of the defined types were placed in this encounter.  Orders:  No orders of the defined types were placed in this encounter.  Follow-up plan:   Return for scheduled encounter.     Interventional Therapies  Risk   Complexity Considerations:   Estimated body mass index is 43.6 kg/m as calculated from the following:   Height as of this encounter: '5\' 5"'  (1.651 m).   Weight as of this encounter: 262 lb (118.8 kg). NOTE: Eliquis Anticoagulation: (Stop: 3 days   Restart: 6 hrs)   Planned   Pending:   Therapeutic right L3 and L4  TFESI + right IA hip injection    Under consideration:   Diagnostic bilateral iliopsoas bursa injection #1  Diagnostic midline T12-L1 LESI    Completed by Sharlet Salina, DO:   02/23/2020: RF neurotomy to the bilateral L3-4 and L4-5 facet joints 12/16/2019: Bilateral L4-5 transforaminal ESI (ODI: 64%, one day of relief then return of pain) 11/19/2019: Bilateral L5-S1 transforaminal ESI (ODI 64%, no relief) 10/06/2019: MBB to the bilateral L3-4 and L4-5 facet joints (8/10 to 1/10, continued relief of back pain new pain radiating to the bilateral posterior thighs and occasional anterior thighs) 08/05/2019: MBB to the bilateral L3-4 and L4-5 facet joints (10/10 to 3/10) 06/01/2019: Bilateral L3-4 and L4-5 facet joint injections 06/09/2018: Bilateral L5-S1 transforaminal ESI (mild relief x3 days) 05/11/2018: Bilateral L5-S1 transforaminal ESI (30% relief x7 to 10 days)   Completed:   Diagnostic therapeutic right L4-5 LESI x1 (05/22/2021) (100/100/100/90)  Diagnostic bilateral IA hip joint injection x1 (11/30/2020) (100/100/80/80)  Therapeutic right IA hip joint injection x1 (06/12/2021) (100/100/75/0)  Diagnostic right gluteofemoral bursa inj. x1 (06/12/2021) (100/100/75/0)  Diagnostic bilateral lumbar facet MBB x3 (01/04/2021) (100/50/0/70)  Diagnostic bilateral L3 TFESI x3 (11/07/2020) (100/100/100/100% relief of the bilateral L1/L2 radiculitis)  Diagnostic right L2-3 LESI x1 (07/11/2020) (100/100/100/100% relief of the bilateral L1/L2 radiculitis)    Therapeutic   Palliative (PRN) options:   Therapeutic right L2-3 LESI #2  Therapeutic bilateral L3 TFESI #4  Therapeutic right L4 LESI #2  Diagnostic bilateral lumbar facet MBB #3     Recent Visits Date Type Provider Dept  06/12/21 Procedure visit Milinda Pointer, MD [facility]  06/05/21 Office Visit Milinda Pointer, MD [facility]  05/22/21 Procedure visit Milinda Pointer, MD [facility]  05/14/21  Office Visit Milinda Pointer, MD [facility]  Showing recent visits within past 90 days and meeting all other requirements Today's Visits Date Type Provider Dept  06/26/21 Office Visit Milinda Pointer, MD [facility]  Showing today's visits and meeting all other requirements Future Appointments Date Type Provider Dept  07/30/21 Appointment Milinda Pointer, MD [facility]  Showing future appointments within next 90 days and meeting all other requirements  I discussed the assessment and treatment plan with the patient. The patient was provided an opportunity to ask questions and all were answered. The patient agreed with the plan and demonstrated an understanding of the instructions.  Patient advised to call back or seek an in-person evaluation if the symptoms or condition worsens.  Duration of encounter: 30 minutes.  Note by: Beatriz Chancellor  Farrel Conners, MD Date: 06/26/2021; Time: 4:24 PM

## 2021-06-27 ENCOUNTER — Other Ambulatory Visit: Payer: Self-pay | Admitting: Neurosurgery

## 2021-07-04 NOTE — Progress Notes (Signed)
Surgical Instructions ? ? ? Your procedure is scheduled on July 09, 2021. ? Report to [facility] Main Entrance "A" at 06:00 A.M., then check in with the Admitting office. ? Call this number if you have problems the morning of surgery: ? [phone] ? ? If you have any questions prior to your surgery date call [phone]: Open Monday-Friday 8am-4pm ? ? Remember: ? Do not eat or drink after midnight the night before your surgery ?  ? Take these medicines the morning of surgery with A SIP OF WATER:  ?Duloxetine (Cymbalta) ?Escitalopram (Lexapro) ?Gabapentin (Neurontin) ?Pravastatin (Pravachol) ?Pantoprazole (Protonix) ?Propranolol (Inderal) ? ?If needed: ?Oxycodone (Oxy IR) ?Acetaminophen (Tylenol) ? ?PER YOUR DOCTOR, STOP your ELIQUIS 3 days prior to surgery. ? ?As of today, STOP taking any Aspirin (unless otherwise instructed by your surgeon) Aleve, Naproxen, Ibuprofen, Motrin, Advil, Goody's, BC's, all herbal medications, fish oil, and all vitamins. ? ?         ?Do not wear jewelry or makeup ?Do not wear lotions, powders, perfumes, or deodorant. ?Do not shave 48 hours prior to surgery.  ?Do not bring valuables to the hospital. ?Do not wear nail polish, gel polish, artificial nails, or any other type of covering on natural nails (fingers and toes) ?If you have artificial nails or gel coating that need to be removed by a nail salon, please have this removed prior to surgery. Artificial nails or gel coating may interfere with anesthesia's ability to adequately monitor your vital signs. ? ?[facility] is not responsible for any belongings or valuables. .  ? ?Do NOT Smoke (Tobacco/Vaping)  24 hours prior to your procedure ? ?If you use a CPAP at night, you may bring your mask for your overnight stay. ?  ?Contacts, glasses, hearing aids, dentures or partials may not be worn into surgery, please bring cases for these belongings ?  ?For patients admitted to the hospital, discharge time will be determined by your  treatment team. ?  ?Patients discharged the day of surgery will not be allowed to drive home, and someone needs to stay with them for 24 hours. ? ?NO VISITORS WILL BE ALLOWED IN PRE-OP WHERE PATIENTS ARE PREPPED FOR SURGERY.  ONLY 1 SUPPORT PERSON MAY BE PRESENT IN THE WAITING ROOM WHILE YOU ARE IN SURGERY.  IF YOU ARE TO BE ADMITTED, ONCE YOU ARE IN YOUR ROOM YOU WILL BE ALLOWED TWO (2) VISITORS. 1 (ONE) VISITOR MAY STAY OVERNIGHT BUT MUST ARRIVE TO THE ROOM BY 8pm.  Minor children may have two parents present. Special consideration for safety and communication needs will be reviewed on a case by case basis. ? ?Special instructions:   ? ?Oral Hygiene is also important to reduce your risk of infection.  Remember - BRUSH YOUR TEETH THE MORNING OF SURGERY WITH YOUR REGULAR TOOTHPASTE ? ? ?[place]- Preparing For Surgery ? ?Before surgery, you can play an important role. Because skin is not sterile, your skin needs to be as free of germs as possible. You can reduce the number of germs on your skin by washing with CHG (chlorahexidine gluconate) Soap before surgery.  CHG is an antiseptic cleaner which kills germs and bonds with the skin to continue killing germs even after washing.   ? ? ?Please do not use if you have an allergy to CHG or antibacterial soaps. If your skin becomes reddened/irritated stop using the CHG.  ?Do not shave (including legs and underarms) for at least 48 hours prior to first CHG shower. It  is OK to shave your face. ? ?Please follow these instructions carefully. ?  ? ? Shower the NIGHT BEFORE SURGERY and the MORNING OF SURGERY with CHG Soap.  ? If you chose to wash your hair, wash your hair first as usual with your normal shampoo. After you shampoo, rinse your hair and body thoroughly to remove the shampoo.  Then [employer] and genitals (private parts) with your normal soap and rinse thoroughly to remove soap. ? ?After that Use CHG Soap as you would any other liquid soap. You can apply CHG  directly to the skin and wash gently with a scrungie or a clean washcloth.  ? ?Apply the CHG Soap to your body ONLY FROM THE NECK DOWN.  Do not use on open wounds or open sores. Avoid contact with your eyes, ears, mouth and genitals (private parts). Wash Face and genitals (private parts)  with your normal soap.  ? ?Wash thoroughly, paying special attention to the area where your surgery will be performed. ? ?Thoroughly rinse your body with warm water from the neck down. ? ?DO NOT shower/wash with your normal soap after using and rinsing off the CHG Soap. ? ?Pat yourself dry with a CLEAN TOWEL. ? ?Wear CLEAN PAJAMAS to bed the night before surgery ? ?Place CLEAN SHEETS on your bed the night before your surgery ? ?DO NOT SLEEP WITH PETS. ? ? ?Day of Surgery: ? ?Take a shower with CHG soap. ?Wear Clean/Comfortable clothing the morning of surgery ?Do not apply any deodorants/lotions.   ?Remember to brush your teeth WITH YOUR REGULAR TOOTHPASTE. ? ? ? ?COVID testing OR BEFORE YOUR SURGERY: ? ?If you are going to stay overnight or be admitted after your procedure/surgery and require a pre-op COVID test, please follow these instructions after your COVID test  ? ?You are not required to quarantine however you are required to wear a well-fitting mask when you are out and around people not in your household.  If your mask becomes wet or soiled, replace with a new one. ? ?Wash your hands often with soap and water for 20 seconds or clean your hands with an alcohol-based hand sanitizer that contains at least 60% alcohol. ? ?Do not share personal items. ? ?Notify your provider: ?if you are in close contact with someone who has COVID  ?or if you develop a fever of 100.4 or greater, sneezing, cough, sore throat, shortness of breath or body aches. ? ?  ?Please read over the following fact sheets that you were given.  ? ?

## 2021-07-04 NOTE — Progress Notes (Signed)
Dr. Marchelle Folks office called regarding Eliquis instructions.  Lorriane Shire will fax over instructions from PCP.  ? ?Instructions regarding Eliquis: HOLD 3 DAYS PRIOR TO SURGERY. Surgical instructions updated. ?

## 2021-07-05 ENCOUNTER — Other Ambulatory Visit: Payer: Self-pay

## 2021-07-05 ENCOUNTER — Encounter (HOSPITAL_COMMUNITY)
Admission: RE | Admit: 2021-07-05 | Discharge: 2021-07-05 | Disposition: A | Discharge status: Home / Self Care | Payer: Medicare HMO | Source: Ambulatory Visit | Attending: Neurosurgery | Admitting: Neurosurgery

## 2021-07-05 ENCOUNTER — Encounter (HOSPITAL_COMMUNITY): Payer: Self-pay

## 2021-07-05 VITALS — BP 126/75 | HR 60 | Temp 98.1°F | Resp 18 | Ht 64.0 in | Wt 240.7 lb

## 2021-07-05 DIAGNOSIS — G4733 Obstructive sleep apnea (adult) (pediatric): Secondary | ICD-10-CM | POA: Diagnosis not present

## 2021-07-05 DIAGNOSIS — Z01812 Encounter for preprocedural laboratory examination: Secondary | ICD-10-CM | POA: Diagnosis not present

## 2021-07-05 DIAGNOSIS — N189 Chronic kidney disease, unspecified: Secondary | ICD-10-CM | POA: Insufficient documentation

## 2021-07-05 DIAGNOSIS — C9110 Chronic lymphocytic leukemia of B-cell type not having achieved remission: Secondary | ICD-10-CM | POA: Insufficient documentation

## 2021-07-05 DIAGNOSIS — I739 Peripheral vascular disease, unspecified: Secondary | ICD-10-CM | POA: Diagnosis not present

## 2021-07-05 DIAGNOSIS — D472 Monoclonal gammopathy: Secondary | ICD-10-CM | POA: Diagnosis not present

## 2021-07-05 DIAGNOSIS — Z7901 Long term (current) use of anticoagulants: Secondary | ICD-10-CM | POA: Diagnosis not present

## 2021-07-05 DIAGNOSIS — Z86711 Personal history of pulmonary embolism: Secondary | ICD-10-CM | POA: Insufficient documentation

## 2021-07-05 DIAGNOSIS — I2510 Atherosclerotic heart disease of native coronary artery without angina pectoris: Secondary | ICD-10-CM | POA: Insufficient documentation

## 2021-07-05 DIAGNOSIS — I129 Hypertensive chronic kidney disease with stage 1 through stage 4 chronic kidney disease, or unspecified chronic kidney disease: Secondary | ICD-10-CM | POA: Diagnosis not present

## 2021-07-05 DIAGNOSIS — M48061 Spinal stenosis, lumbar region without neurogenic claudication: Secondary | ICD-10-CM | POA: Diagnosis not present

## 2021-07-05 LAB — CBC
HCT: 42.1 % (ref 36.0–46.0)
Hemoglobin: 14 g/dL (ref 12.0–15.0)
MCH: 32.4 pg (ref 26.0–34.0)
MCHC: 33.3 g/dL (ref 30.0–36.0)
MCV: 97.5 fL (ref 80.0–100.0)
Platelets: 284 10*3/uL (ref 150–400)
RBC: 4.32 MIL/uL (ref 3.87–5.11)
RDW: 14.3 % (ref 11.5–15.5)
WBC: 16.6 10*3/uL — ABNORMAL HIGH (ref 4.0–10.5)
nRBC: 0 % (ref 0.0–0.2)

## 2021-07-05 LAB — BASIC METABOLIC PANEL
Anion gap: 8 (ref 5–15)
BUN: 18 mg/dL (ref 8–23)
CO2: 29 mmol/L (ref 22–32)
Calcium: 9 mg/dL (ref 8.9–10.3)
Chloride: 103 mmol/L (ref 98–111)
Creatinine, Ser: 1.12 mg/dL — ABNORMAL HIGH (ref 0.44–1.00)
GFR, Estimated: 50 mL/min — ABNORMAL LOW (ref >60)
Glucose, Bld: 113 mg/dL — ABNORMAL HIGH (ref 70–99)
Potassium: 4.3 mmol/L (ref 3.5–5.1)
Sodium: 140 mmol/L (ref 135–145)

## 2021-07-05 LAB — SURGICAL PCR SCREEN
MRSA, PCR: NEGATIVE
Staphylococcus aureus: POSITIVE — AB

## 2021-07-05 NOTE — Progress Notes (Signed)
Spoke with Lorriane Shire at Dr. Marchelle Folks office regarding pt's elevated WBC's. Message will be relayed to Dr. Annette Stable. No new orders at this time.  ? ?Jacqlyn Larsen, RN ? ?

## 2021-07-05 NOTE — Progress Notes (Signed)
PCP - Frazier Richards, MD ?Cardiologist - Dr. Clayborn Bigness ? ?PPM/ICD - n/a ? ?Chest x-ray - n/a ?EKG - 02/13/21 ?Stress Test - 11/06/20 ?ECHO - 11/08/20 ?Cardiac Cath - 2017 ? ?Sleep Study - OSA+ ?CPAP - typically uses it every night. As of late, (1 month) she has not used it consistently d/t lack of rest from back pain.  ? ?Blood Thinner Instructions: Per Dr. Annette Stable, pt states she was told to stop her Eliquis 3 days prior to surgery. ?Aspirin Instructions: n/a  ? ?NPO at MD ? ?COVID TEST- n/a ? ?Anesthesia review: Yes, cardiac history ? ?Patient denies shortness of breath, fever, cough and chest pain at PAT appointment ? ? ?All instructions explained to the patient, with a verbal understanding of the material. Patient agrees to go over the instructions while at home for a better understanding. Patient also instructed to self quarantine after being tested for COVID-19. The opportunity to ask questions was provided. ?  ?

## 2021-07-06 NOTE — Progress Notes (Signed)
Anesthesia Chart Review: ? ? Case: [id number] Date/Time: 07/09/21 0745  ? Procedure: Laminectomy and Foraminotomy - L2-L3 - L3-L4 - L4-L5 (Back)  ? Anesthesia type: General  ? Pre-op diagnosis: Stenosis  ? [facility] RO[facility] / [facility]  ? Surgeons: Earnie Larsson, MD  ? ?  ? ? ?DISCUSSION: ?Pt is 81 years old with hx HTN, PVD,  MGUS, CLL, PE (massive bilateral PE 2017, on lifelong anticoagulation), OSA, CKD ? ?Pt to stop eliquis 3 days before surgery ? ?VS: BP 126/75   Pulse 60   Temp 36.7 ?C (Oral)   Resp 18   Ht '5\' 4"'$  (1.626 m)   Wt 109.2 kg   SpO2 95%   BMI 41.32 kg/m?  ? ?PROVIDERS: ?- PCP is Kirk Ruths, MD (notes in care everywhere)  ? ?- Cardiologist is Lujean Amel, MD. Last office visit 7/68/11 with Gladstone Pih, NP (notes in care everywhere)  ? ?- Oncology Care at [facility]; last office visit 03/30/21 with Beckey Rutter, NP ? ?- Pulmonologist is Wallene Huh, MD. Last office visit 02/01/21 (notes in care everywhere)  ? ?LABS: Labs reviewed: Acceptable for surgery. ?- WBC 16.6 consistent with prior results and CLL   ?(all labs ordered are listed, but only abnormal results are displayed) ? ?Labs Reviewed  ?SURGICAL PCR SCREEN - Abnormal; Notable for the following components:  ?    Result Value  ? Staphylococcus aureus POSITIVE (*)   ? All other components within normal limits  ?BASIC METABOLIC PANEL - Abnormal; Notable for the following components:  ? Glucose, Bld 113 (*)   ? Creatinine, Ser 1.12 (*)   ? GFR, Estimated 50 (*)   ? All other components within normal limits  ?CBC - Abnormal; Notable for the following components:  ? WBC 16.6 (*)   ? All other components within normal limits  ? ? ? ?IMAGES: ?CXR 02/13/21:  ?- No active cardiopulmonary disease. ? ? ?EKG 02/13/21: NSR. Anterior infarct, age undetermined ? ? ?CV: ?Echo 11/08/20:  ?1. Left ventricular ejection fraction, by estimation, is 50 to 55%. The left ventricle has low normal function. The left ventricle has no  regional wall motion abnormalities. Left ventricular diastolic parameters were normal.  ? 2. Right ventricular systolic function is normal. The right ventricular  ?size is normal.  ? 3. The mitral valve is normal in structure. Mild mitral valve  ?regurgitation.  ? 4. The aortic valve is normal in structure. Aortic valve regurgitation is not visualized.  ? ? ?Nuclear stress test 11/06/20: ?The study is normal. ?This is a low risk study. ?The left ventricular ejection fraction is normal (55-65%). ?Blood pressure demonstrated a normal response to exercise. ?There was no ST segment deviation noted during stress. ?  ? ?Past Medical History:  ?Diagnosis Date  ? Anginal pain ([place])   ? atypical chest pain  ? Anxiety   ? Arthritis   ? Back pain   ? Cancer [facility])   ? cll  ? Chronic kidney disease   ? Chronic kidney disease   ? CLL (chronic lymphocytic leukemia) ([place])   ? Depression   ? Epigastric pain   ? GERD (gastroesophageal reflux disease)   ? Headache   ? Hemorrhoids   ? Hypercholesteremia   ? Hyperlipidemia   ? Hypertension   ? 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 Macular degeneration   ? MGUS (monoclonal gammopathy of unknown significance)   ? Morbid obesity ([place])   ?  Obesity   ? Pulmonary embolism ([place])   ? PVD (peripheral vascular disease) ([place])   ? Sleep apnea   ? ? ?Past Surgical History:  ?Procedure Laterality Date  ? APPENDECTOMY    ? BREAST BIOPSY Left 08/19/2004  ? lt bx/clip-neg  ? COLONOSCOPY    ? COLONOSCOPY WITH PROPOFOL N/A 03/10/2017  ? Procedure: COLONOSCOPY WITH PROPOFOL;  Surgeon: [name], MD;  Location: [facility] ENDOSCOPY;  Service: Endoscopy;  Laterality: N/A;  ? ESOPHAGOGASTRODUODENOSCOPY (EGD) WITH PROPOFOL N/A 02/02/2019  ? Procedure: ESOPHAGOGASTRODUODENOSCOPY (EGD) WITH PROPOFOL;  Surgeon: Toledo, Benay Pike, MD;  Location: [facility] ENDOSCOPY;  Service: Endoscopy;  Laterality: N/A;  ? IVC FILTER INSERTION N/A 06/11/2016  ? Procedure: IVC Filter Insertion;  Surgeon: Katha Cabal, MD;  Location: [place] CV LAB;  Service: Cardiovascular;  Laterality: N/A;  ? IVC FILTER REMOVAL N/A 05/06/2017  ? Procedure: IVC FILTER REMOVAL;  Surgeon: Katha Cabal, MD;  Location: [place] CV LAB;  Service: Cardiovascular;  Laterality: N/A;  ? JOINT REPLACEMENT Left 06/17/2016  ? Knees, pt stated left and right have been replaced.  ? LAPAROSCOPIC APPENDECTOMY N/A 10/10/2018  ? Procedure: APPENDECTOMY LAPAROSCOPIC ATTEMPTED CONVERTED TO OPEN;  Surgeon: Herbert Pun, MD;  Location: [facility] ORS;  Service: General;  Laterality: N/A;  ? LAPAROSCOPY N/A 10/10/2018  ? Procedure: LAPAROSCOPY DIAGNOSTIC ATTEMPTED CONVERTED TO OPEN;  Surgeon: Herbert Pun, MD;  Location: [facility] ORS;  Service: General;  Laterality: N/A;  ? neck fusion    ? PARTIAL COLECTOMY Right 10/10/2018  ? Procedure: PARTIAL COLECTOMY;  Surgeon: Herbert Pun, MD;  Location: [facility] ORS;  Service: General;  Laterality: Right;  ? PERIPHERAL VASCULAR CATHETERIZATION N/A 08/15/2015  ? Procedure: pulmonary angiogram with lysis;  Surgeon: Katha Cabal, MD;  Location: [place] CV LAB;  Service: Cardiovascular;  Laterality: N/A;  ? WRIST SURGERY Left 2000  ? ? ?MEDICATIONS: ? acetaminophen (TYLENOL) 650 MG CR tablet  ? b complex vitamins capsule  ? calcium carbonate (OSCAL) 1500 (600 Ca) MG TABS tablet  ? cholestyramine (QUESTRAN) 4 g packet  ? DULoxetine (CYMBALTA) 60 MG capsule  ? ELIQUIS 5 MG TABS tablet  ? escitalopram (LEXAPRO) 10 MG tablet  ? gabapentin (NEURONTIN) 400 MG capsule  ? losartan-hydrochlorothiazide (HYZAAR) 100-12.5 MG tablet  ? magnesium gluconate (MAGONATE) 500 MG tablet  ? Multiple Vitamins-Minerals (PRESERVISION AREDS 2 PO)  ? oxyCODONE (OXY IR/ROXICODONE) 5 MG immediate release tablet  ? oxyCODONE (OXY IR/ROXICODONE) 5 MG immediate release tablet  ? [START ON 07/15/2021] oxyCODONE (OXY IR/ROXICODONE) 5 MG immediate release tablet  ? pantoprazole (PROTONIX) 40 MG tablet  ?  pravastatin (PRAVACHOL) 40 MG tablet  ? propranolol (INDERAL) 40 MG tablet  ? sucralfate (CARAFATE) 1 g tablet  ? TURMERIC CURCUMIN PO  ? ?No current facility-administered medications for this encounter.  ? ?- Pt to stop eliquis 3 days before surgery ? ? ?If no changes, I anticipate pt can proceed with surgery as scheduled.  ? ?Willeen Cass, PhD, FNP-BC ?[facility]/Anesthesiology ?Phone: [phone] ?07/06/2021 8:57 AM  ? ? ? ? ? ? ?

## 2021-07-06 NOTE — Anesthesia Preprocedure Evaluation (Addendum)
Anesthesia Evaluation  ?Patient identified by MRN, date of birth, ID band ?Patient awake ? ? ? ?Reviewed: ?Allergy & Precautions, NPO status , Patient's Chart, lab work & pertinent test results ? ?Airway ?Mallampati: II ? ?TM Distance: >3 FB ?Neck ROM: Full ? ? ? Dental ?no notable dental hx. ? ?  ?Pulmonary ?sleep apnea , PE (on Eliquis) ?  ?Pulmonary exam normal ? ? ? ? ? ? ? Cardiovascular ?hypertension, Pt. on medications and Pt. on home beta blockers ?+ Peripheral Vascular Disease  ? ?Rhythm:Regular Rate:Normal ? ? ?  ?Neuro/Psych ? Headaches, Anxiety Depression   ? GI/Hepatic ?Neg liver ROS, GERD  Medicated,  ?Endo/Other  ?negative endocrine ROS ? Renal/GU ?CRFRenal disease  ?negative genitourinary ?  ?Musculoskeletal ? ?(+) Arthritis , Spinal stenosis  ? Abdominal ?Normal abdominal exam  (+)   ?Peds ? Hematology ?MGUS, CLL   ?Anesthesia Other Findings ? ? Reproductive/Obstetrics ? ?  ? ? ? ? ? ? ? ? ? ? ? ? ? ?  ?  ? ? ? ? ? ? ?Anesthesia Physical ?Anesthesia Plan ? ?ASA: 3 ? ?Anesthesia Plan: General  ? ?Post-op Pain Management: Tylenol PO (pre-op)*  ? ?Induction: Intravenous ? ?PONV Risk Score and Plan: 3 and Ondansetron, Dexamethasone and Treatment may vary due to age or medical condition ? ?Airway Management Planned: Mask and Oral ETT ? ?Additional Equipment: None ? ?Intra-op Plan:  ? ?Post-operative Plan: Extubation in OR ? ?Informed Consent: I have reviewed the patients History and Physical, chart, labs and discussed the procedure including the risks, benefits and alternatives for the proposed anesthesia with the patient or authorized representative who has indicated his/her understanding and acceptance.  ? ? ? ? ? ?Plan Discussed with: CRNA ? ?Anesthesia Plan Comments: (See APP note by Durel Salts, FNP  ? ?Lab Results ?     Component                Value               Date                 ?     WBC                      16.6 (H)            07/05/2021           ?     HGB                       14.0                07/05/2021           ?     HCT                      42.1                07/05/2021           ?     MCV                      97.5                07/05/2021           ?     PLT  284                 07/05/2021           ?Lab Results ?     Component                Value               Date                 ?     NA                       140                 07/05/2021           ?     K                        4.3                 07/05/2021           ?     CO2                      29                  07/05/2021           ?     GLUCOSE                  113 (H)             07/05/2021           ?     BUN                      18                  07/05/2021           ?     CREATININE               1.12 (H)            07/05/2021           ?     CALCIUM                  9.0                 07/05/2021           ?     EGFR                     69                  12/26/2020           ?     GFRNONAA                 50 (L)              07/05/2021           ?ECHO 07/22: ?1. Left ventricular ejection fraction, by estimation, is 50 to 55%. The  ?left ventricle has low normal function. The left ventricle has no regional  ?wall motion abnormalities. Left ventricular diastolic parameters were  ?normal.  ??2. Right ventricular systolic function is normal.  The right ventricular  ?size is normal.  ??3. The mitral valve is normal in structure. Mild mitral valve  ?regurgitation.  ??4. The aortic valve is normal in structure. Aortic valve regurgitation is  ?not visualized. )  ? ? ? ? ?Anesthesia Quick Evaluation ? ?

## 2021-07-09 ENCOUNTER — Encounter (HOSPITAL_COMMUNITY): Payer: Self-pay | Admitting: Neurosurgery

## 2021-07-09 ENCOUNTER — Encounter (HOSPITAL_COMMUNITY)
Admission: RE | Disposition: A | Discharge status: Home / Self Care | Payer: Self-pay | Source: Home / Self Care | Attending: Neurosurgery

## 2021-07-09 ENCOUNTER — Observation Stay (HOSPITAL_COMMUNITY)
Admission: RE | Admit: 2021-07-09 | Discharge: 2021-07-10 | Disposition: A | Discharge status: Home / Self Care | Payer: Medicare HMO | Attending: Neurosurgery | Admitting: Neurosurgery

## 2021-07-09 ENCOUNTER — Other Ambulatory Visit: Payer: Self-pay

## 2021-07-09 ENCOUNTER — Ambulatory Visit (HOSPITAL_BASED_OUTPATIENT_CLINIC_OR_DEPARTMENT_OTHER): Payer: Medicare HMO | Admitting: Anesthesiology

## 2021-07-09 ENCOUNTER — Ambulatory Visit (HOSPITAL_COMMUNITY): Payer: Medicare HMO | Admitting: Emergency Medicine

## 2021-07-09 ENCOUNTER — Ambulatory Visit (HOSPITAL_COMMUNITY): Payer: Medicare HMO

## 2021-07-09 DIAGNOSIS — I2699 Other pulmonary embolism without acute cor pulmonale: Secondary | ICD-10-CM | POA: Diagnosis not present

## 2021-07-09 DIAGNOSIS — Z96652 Presence of left artificial knee joint: Secondary | ICD-10-CM | POA: Diagnosis not present

## 2021-07-09 DIAGNOSIS — I129 Hypertensive chronic kidney disease with stage 1 through stage 4 chronic kidney disease, or unspecified chronic kidney disease: Secondary | ICD-10-CM

## 2021-07-09 DIAGNOSIS — M48062 Spinal stenosis, lumbar region with neurogenic claudication: Secondary | ICD-10-CM

## 2021-07-09 DIAGNOSIS — M7138 Other bursal cyst, other site: Secondary | ICD-10-CM | POA: Diagnosis not present

## 2021-07-09 DIAGNOSIS — Z7901 Long term (current) use of anticoagulants: Secondary | ICD-10-CM | POA: Insufficient documentation

## 2021-07-09 DIAGNOSIS — N189 Chronic kidney disease, unspecified: Secondary | ICD-10-CM | POA: Insufficient documentation

## 2021-07-09 DIAGNOSIS — Z79899 Other long term (current) drug therapy: Secondary | ICD-10-CM | POA: Diagnosis not present

## 2021-07-09 DIAGNOSIS — Z981 Arthrodesis status: Secondary | ICD-10-CM | POA: Diagnosis not present

## 2021-07-09 HISTORY — PX: LUMBAR LAMINECTOMY/DECOMPRESSION MICRODISCECTOMY: SHX5026

## 2021-07-09 SURGERY — LUMBAR LAMINECTOMY/DECOMPRESSION MICRODISCECTOMY 3 LEVELS
Anesthesia: General | Site: Back

## 2021-07-09 MED ORDER — SUCRALFATE 1 G PO TABS
1.0000 g | ORAL_TABLET | Freq: Every day | ORAL | Status: DC
  Administered 2021-07-10: 1 g via ORAL
  Filled 2021-07-09: qty 1

## 2021-07-09 MED ORDER — POLYETHYLENE GLYCOL 3350 17 G PO PACK: 17.0000 g | PACK | Freq: Every day | ORAL | Status: DC | PRN

## 2021-07-09 MED ORDER — SODIUM CHLORIDE 0.9% FLUSH
3.0000 mL | Freq: Two times a day (BID) | INTRAVENOUS | Status: DC
  Administered 2021-07-09 (×2): 3 mL via INTRAVENOUS

## 2021-07-09 MED ORDER — KETOROLAC TROMETHAMINE 15 MG/ML IJ SOLN
INTRAMUSCULAR | Status: DC | PRN
  Administered 2021-07-09: 15 mg via INTRAVENOUS

## 2021-07-09 MED ORDER — GABAPENTIN 400 MG PO CAPS
400.0000 mg | ORAL_CAPSULE | Freq: Three times a day (TID) | ORAL | Status: DC
  Administered 2021-07-09 – 2021-07-10 (×3): 400 mg via ORAL
  Filled 2021-07-09 (×3): qty 1

## 2021-07-09 MED ORDER — LOSARTAN POTASSIUM-HCTZ 100-12.5 MG PO TABS: 0.5000 | ORAL_TABLET | Freq: Every day | ORAL | Status: DC

## 2021-07-09 MED ORDER — ORAL CARE MOUTH RINSE: 15.0000 mL | Freq: Once | OROMUCOSAL | Status: AC

## 2021-07-09 MED ORDER — ROCURONIUM BROMIDE 10 MG/ML (PF) SYRINGE
PREFILLED_SYRINGE | INTRAVENOUS | Status: DC | PRN
  Administered 2021-07-09: 60 mg via INTRAVENOUS

## 2021-07-09 MED ORDER — ROCURONIUM BROMIDE 10 MG/ML (PF) SYRINGE
PREFILLED_SYRINGE | INTRAVENOUS | Status: AC
  Filled 2021-07-09: qty 10

## 2021-07-09 MED ORDER — ONDANSETRON HCL 4 MG/2ML IJ SOLN
INTRAMUSCULAR | Status: DC | PRN
  Administered 2021-07-09: 4 mg via INTRAVENOUS

## 2021-07-09 MED ORDER — PHENOL 1.4 % MT LIQD: 1.0000 | OROMUCOSAL | Status: DC | PRN

## 2021-07-09 MED ORDER — PROPRANOLOL HCL 40 MG PO TABS
40.0000 mg | ORAL_TABLET | Freq: Every day | ORAL | Status: DC
Start: 2021-07-10 — End: 2021-07-10
  Administered 2021-07-10: 40 mg via ORAL
  Filled 2021-07-09: qty 1

## 2021-07-09 MED ORDER — THROMBIN 5000 UNITS EX SOLR
CUTANEOUS | Status: DC | PRN
  Administered 2021-07-09: 10000 [IU] via TOPICAL

## 2021-07-09 MED ORDER — FENTANYL CITRATE (PF) 100 MCG/2ML IJ SOLN
INTRAMUSCULAR | Status: AC
  Filled 2021-07-09: qty 2

## 2021-07-09 MED ORDER — ACETAMINOPHEN 650 MG RE SUPP: 650.0000 mg | RECTAL | Status: DC | PRN

## 2021-07-09 MED ORDER — KETAMINE HCL-SODIUM CHLORIDE 100-0.9 MG/10ML-% IV SOSY
PREFILLED_SYRINGE | INTRAVENOUS | Status: DC | PRN
  Administered 2021-07-09: 10 mg via INTRAVENOUS
  Administered 2021-07-09: 20 mg via INTRAVENOUS

## 2021-07-09 MED ORDER — HYDROCHLOROTHIAZIDE 12.5 MG PO TABS
12.5000 mg | ORAL_TABLET | Freq: Every day | ORAL | Status: DC
  Administered 2021-07-10: 12.5 mg via ORAL
  Filled 2021-07-09: qty 1

## 2021-07-09 MED ORDER — SODIUM CHLORIDE 0.9% FLUSH: 3.0000 mL | INTRAVENOUS | Status: DC | PRN

## 2021-07-09 MED ORDER — PHENYLEPHRINE 40 MCG/ML (10ML) SYRINGE FOR IV PUSH (FOR BLOOD PRESSURE SUPPORT)
PREFILLED_SYRINGE | INTRAVENOUS | Status: DC | PRN
  Administered 2021-07-09: 80 ug via INTRAVENOUS

## 2021-07-09 MED ORDER — MAGNESIUM GLUCONATE 500 MG PO TABS
500.0000 mg | ORAL_TABLET | Freq: Two times a day (BID) | ORAL | Status: DC
  Administered 2021-07-09 – 2021-07-10 (×2): 500 mg via ORAL
  Filled 2021-07-09 (×3): qty 1

## 2021-07-09 MED ORDER — VANCOMYCIN HCL 1000 MG IV SOLR
INTRAVENOUS | Status: AC
  Filled 2021-07-09: qty 20

## 2021-07-09 MED ORDER — BISACODYL 10 MG RE SUPP
10.0000 mg | Freq: Every day | RECTAL | Status: DC | PRN
Start: 2021-07-09 — End: 2021-07-10

## 2021-07-09 MED ORDER — VANCOMYCIN HCL 1000 MG IV SOLR
INTRAVENOUS | Status: DC | PRN
  Administered 2021-07-09: 1000 mg via TOPICAL

## 2021-07-09 MED ORDER — BUPIVACAINE HCL (PF) 0.25 % IJ SOLN
INTRAMUSCULAR | Status: DC | PRN
  Administered 2021-07-09: 20 mL

## 2021-07-09 MED ORDER — CHLORHEXIDINE GLUCONATE 0.12 % MT SOLN
15.0000 mL | Freq: Once | OROMUCOSAL | Status: AC
  Administered 2021-07-09: 15 mL via OROMUCOSAL
  Filled 2021-07-09: qty 15

## 2021-07-09 MED ORDER — LIDOCAINE 2% (20 MG/ML) 5 ML SYRINGE
INTRAMUSCULAR | Status: DC | PRN
  Administered 2021-07-09: 60 mg via INTRAVENOUS

## 2021-07-09 MED ORDER — HEMOSTATIC AGENTS (NO CHARGE) OPTIME
TOPICAL | Status: DC | PRN
  Administered 2021-07-09: 1 via TOPICAL

## 2021-07-09 MED ORDER — PROPOFOL 10 MG/ML IV BOLUS
INTRAVENOUS | Status: DC | PRN
  Administered 2021-07-09: 30 mg via INTRAVENOUS
  Administered 2021-07-09: 100 mg via INTRAVENOUS

## 2021-07-09 MED ORDER — LACTATED RINGERS IV SOLN: INTRAVENOUS | Status: DC

## 2021-07-09 MED ORDER — BUPIVACAINE HCL (PF) 0.25 % IJ SOLN
INTRAMUSCULAR | Status: AC
  Filled 2021-07-09: qty 30

## 2021-07-09 MED ORDER — FENTANYL CITRATE (PF) 100 MCG/2ML IJ SOLN
25.0000 ug | INTRAMUSCULAR | Status: DC | PRN
  Administered 2021-07-09 (×3): 50 ug via INTRAVENOUS

## 2021-07-09 MED ORDER — PROPOFOL 10 MG/ML IV BOLUS
INTRAVENOUS | Status: AC
  Filled 2021-07-09: qty 20

## 2021-07-09 MED ORDER — ACETAMINOPHEN 325 MG PO TABS
650.0000 mg | ORAL_TABLET | ORAL | Status: DC | PRN
  Administered 2021-07-09 (×2): 650 mg via ORAL
  Filled 2021-07-09 (×2): qty 2

## 2021-07-09 MED ORDER — LIDOCAINE 2% (20 MG/ML) 5 ML SYRINGE
INTRAMUSCULAR | Status: AC
  Filled 2021-07-09: qty 5

## 2021-07-09 MED ORDER — MENTHOL 3 MG MT LOZG
1.0000 | LOZENGE | OROMUCOSAL | Status: DC | PRN
  Filled 2021-07-09: qty 9

## 2021-07-09 MED ORDER — CEFAZOLIN SODIUM-DEXTROSE 1-4 GM/50ML-% IV SOLN
1.0000 g | Freq: Three times a day (TID) | INTRAVENOUS | Status: AC
  Administered 2021-07-09 (×2): 1 g via INTRAVENOUS
  Filled 2021-07-09 (×2): qty 50

## 2021-07-09 MED ORDER — ONDANSETRON HCL 4 MG/2ML IJ SOLN
INTRAMUSCULAR | Status: AC
  Filled 2021-07-09: qty 2

## 2021-07-09 MED ORDER — METHOCARBAMOL 1000 MG/10ML IJ SOLN
500.0000 mg | Freq: Four times a day (QID) | INTRAVENOUS | Status: DC | PRN
  Filled 2021-07-09: qty 5

## 2021-07-09 MED ORDER — ONDANSETRON HCL 4 MG PO TABS: 4.0000 mg | ORAL_TABLET | Freq: Four times a day (QID) | ORAL | Status: DC | PRN

## 2021-07-09 MED ORDER — DEXAMETHASONE SODIUM PHOSPHATE 10 MG/ML IJ SOLN
INTRAMUSCULAR | Status: AC
  Filled 2021-07-09: qty 1

## 2021-07-09 MED ORDER — DEXAMETHASONE SODIUM PHOSPHATE 10 MG/ML IJ SOLN
INTRAMUSCULAR | Status: DC | PRN
  Administered 2021-07-09: 10 mg via INTRAVENOUS

## 2021-07-09 MED ORDER — ESCITALOPRAM OXALATE 10 MG PO TABS
10.0000 mg | ORAL_TABLET | Freq: Every day | ORAL | Status: DC
Start: 2021-07-10 — End: 2021-07-10
  Administered 2021-07-10: 10 mg via ORAL
  Filled 2021-07-09: qty 1

## 2021-07-09 MED ORDER — FLEET ENEMA 7-19 GM/118ML RE ENEM: 1.0000 | ENEMA | Freq: Once | RECTAL | Status: DC | PRN

## 2021-07-09 MED ORDER — CHLORHEXIDINE GLUCONATE CLOTH 2 % EX PADS: 6.0000 | MEDICATED_PAD | Freq: Once | CUTANEOUS | Status: DC

## 2021-07-09 MED ORDER — FENTANYL CITRATE (PF) 250 MCG/5ML IJ SOLN
INTRAMUSCULAR | Status: DC | PRN
  Administered 2021-07-09 (×4): 50 ug via INTRAVENOUS

## 2021-07-09 MED ORDER — HYDROMORPHONE HCL 1 MG/ML IJ SOLN: 1.0000 mg | INTRAMUSCULAR | Status: DC | PRN

## 2021-07-09 MED ORDER — CEFAZOLIN SODIUM-DEXTROSE 2-4 GM/100ML-% IV SOLN
2.0000 g | INTRAVENOUS | Status: AC
  Administered 2021-07-09: 2 g via INTRAVENOUS
  Filled 2021-07-09: qty 100

## 2021-07-09 MED ORDER — THROMBIN 5000 UNITS EX SOLR
CUTANEOUS | Status: AC
  Filled 2021-07-09: qty 10000

## 2021-07-09 MED ORDER — DULOXETINE HCL 30 MG PO CPEP
60.0000 mg | ORAL_CAPSULE | Freq: Every day | ORAL | Status: DC
Start: 2021-07-10 — End: 2021-07-10
  Administered 2021-07-10: 60 mg via ORAL
  Filled 2021-07-09: qty 2

## 2021-07-09 MED ORDER — 0.9 % SODIUM CHLORIDE (POUR BTL) OPTIME
TOPICAL | Status: DC | PRN
  Administered 2021-07-09: 1000 mL

## 2021-07-09 MED ORDER — SUGAMMADEX SODIUM 200 MG/2ML IV SOLN
INTRAVENOUS | Status: DC | PRN
  Administered 2021-07-09: 200 mg via INTRAVENOUS

## 2021-07-09 MED ORDER — PHENYLEPHRINE 40 MCG/ML (10ML) SYRINGE FOR IV PUSH (FOR BLOOD PRESSURE SUPPORT)
PREFILLED_SYRINGE | INTRAVENOUS | Status: AC
  Filled 2021-07-09: qty 10

## 2021-07-09 MED ORDER — CALCIUM CARBONATE 1500 (600 CA) MG PO TABS: 1500.0000 mg | ORAL_TABLET | Freq: Two times a day (BID) | ORAL | Status: DC

## 2021-07-09 MED ORDER — CHOLESTYRAMINE 4 G PO PACK
4.0000 g | PACK | Freq: Every day | ORAL | Status: DC
  Administered 2021-07-10: 4 g via ORAL
  Filled 2021-07-09 (×2): qty 1

## 2021-07-09 MED ORDER — SODIUM CHLORIDE 0.9 % IV SOLN
250.0000 mL | INTRAVENOUS | Status: DC
  Administered 2021-07-09: 250 mL via INTRAVENOUS

## 2021-07-09 MED ORDER — KETOROLAC TROMETHAMINE 15 MG/ML IJ SOLN
7.5000 mg | Freq: Four times a day (QID) | INTRAMUSCULAR | Status: AC
  Administered 2021-07-09 – 2021-07-10 (×4): 7.5 mg via INTRAVENOUS
  Filled 2021-07-09 (×4): qty 1

## 2021-07-09 MED ORDER — LOSARTAN POTASSIUM 50 MG PO TABS
100.0000 mg | ORAL_TABLET | Freq: Every day | ORAL | Status: DC
  Administered 2021-07-10: 100 mg via ORAL
  Filled 2021-07-09: qty 2

## 2021-07-09 MED ORDER — PRAVASTATIN SODIUM 40 MG PO TABS
40.0000 mg | ORAL_TABLET | Freq: Every day | ORAL | Status: DC
Start: 2021-07-10 — End: 2021-07-10
  Administered 2021-07-10: 40 mg via ORAL
  Filled 2021-07-09: qty 1

## 2021-07-09 MED ORDER — OXYCODONE HCL 5 MG PO TABS
10.0000 mg | ORAL_TABLET | ORAL | Status: DC | PRN
  Administered 2021-07-09 (×2): 10 mg via ORAL
  Filled 2021-07-09 (×3): qty 2

## 2021-07-09 MED ORDER — HYDROCODONE-ACETAMINOPHEN 10-325 MG PO TABS
1.0000 | ORAL_TABLET | ORAL | Status: DC | PRN
  Administered 2021-07-09 – 2021-07-10 (×4): 1 via ORAL
  Filled 2021-07-09 (×4): qty 1

## 2021-07-09 MED ORDER — PANTOPRAZOLE SODIUM 40 MG PO TBEC
40.0000 mg | DELAYED_RELEASE_TABLET | Freq: Every day | ORAL | Status: DC
  Administered 2021-07-10: 40 mg via ORAL
  Filled 2021-07-09: qty 1

## 2021-07-09 MED ORDER — FENTANYL CITRATE (PF) 250 MCG/5ML IJ SOLN
INTRAMUSCULAR | Status: AC
Start: 2021-07-09 — End: ?
  Filled 2021-07-09: qty 5

## 2021-07-09 MED ORDER — ACETAMINOPHEN 500 MG PO TABS
1000.0000 mg | ORAL_TABLET | Freq: Once | ORAL | Status: AC
  Administered 2021-07-09: 1000 mg via ORAL
  Filled 2021-07-09: qty 2

## 2021-07-09 MED ORDER — B COMPLEX VITAMINS PO CAPS: 1.0000 | ORAL_CAPSULE | Freq: Every day | ORAL | Status: DC

## 2021-07-09 MED ORDER — METHOCARBAMOL 500 MG PO TABS
500.0000 mg | ORAL_TABLET | Freq: Four times a day (QID) | ORAL | Status: DC | PRN
  Administered 2021-07-09 – 2021-07-10 (×3): 500 mg via ORAL
  Filled 2021-07-09 (×3): qty 1

## 2021-07-09 MED ORDER — KETAMINE HCL 50 MG/5ML IJ SOSY
PREFILLED_SYRINGE | INTRAMUSCULAR | Status: AC
  Filled 2021-07-09: qty 5

## 2021-07-09 MED ORDER — ONDANSETRON HCL 4 MG/2ML IJ SOLN: 4.0000 mg | Freq: Four times a day (QID) | INTRAMUSCULAR | Status: DC | PRN

## 2021-07-09 SURGICAL SUPPLY — 56 items
ADH SKN CLS APL DERMABOND .7 (GAUZE/BANDAGES/DRESSINGS) ×1
ADH SKN CLS LQ APL DERMABOND (GAUZE/BANDAGES/DRESSINGS) ×1
APL SKNCLS STERI-STRIP NONHPOA (GAUZE/BANDAGES/DRESSINGS) ×1
BAG COUNTER SPONGE SURGICOUNT (BAG) ×4 IMPLANT
BAG DECANTER FOR FLEXI CONT (MISCELLANEOUS) ×3 IMPLANT
BAG SPNG CNTER NS LX DISP (BAG) ×2
BAND INSRT 18 STRL LF DISP RB (MISCELLANEOUS)
BAND RUBBER #18 3X1/16 STRL (MISCELLANEOUS) ×4 IMPLANT
BENZOIN TINCTURE PRP APPL 2/3 (GAUZE/BANDAGES/DRESSINGS) ×3 IMPLANT
BLADE CLIPPER SURG (BLADE) IMPLANT
BUR CUTTER 7.0 ROUND (BURR) ×3 IMPLANT
CANISTER SUCT 3000ML PPV (MISCELLANEOUS) ×3 IMPLANT
CARTRIDGE OIL MAESTRO DRILL (MISCELLANEOUS) ×2 IMPLANT
DECANTER SPIKE VIAL GLASS SM (MISCELLANEOUS) ×2 IMPLANT
DERMABOND ADHESIVE PROPEN (GAUZE/BANDAGES/DRESSINGS) ×1
DERMABOND ADVANCED (GAUZE/BANDAGES/DRESSINGS) ×1
DERMABOND ADVANCED .7 DNX12 (GAUZE/BANDAGES/DRESSINGS) ×2 IMPLANT
DERMABOND ADVANCED .7 DNX6 (GAUZE/BANDAGES/DRESSINGS) IMPLANT
DIFFUSER DRILL AIR PNEUMATIC (MISCELLANEOUS) ×3 IMPLANT
DRAPE HALF SHEET 40X57 (DRAPES) IMPLANT
DRAPE LAPAROTOMY 100X72X124 (DRAPES) ×3 IMPLANT
DRAPE MICROSCOPE LEICA (MISCELLANEOUS) ×2 IMPLANT
DRAPE SURG 17X23 STRL (DRAPES) ×6 IMPLANT
DRSG OPSITE POSTOP 4X6 (GAUZE/BANDAGES/DRESSINGS) ×1 IMPLANT
DURAPREP 26ML APPLICATOR (WOUND CARE) ×3 IMPLANT
ELECT REM PT RETURN 9FT ADLT (ELECTROSURGICAL) ×2
ELECTRODE REM PT RTRN 9FT ADLT (ELECTROSURGICAL) ×2 IMPLANT
EVACUATOR 1/8 PVC DRAIN (DRAIN) ×1 IMPLANT
GAUZE 4X4 16PLY ~~LOC~~+RFID DBL (SPONGE) ×1 IMPLANT
GAUZE SPONGE 4X4 12PLY STRL (GAUZE/BANDAGES/DRESSINGS) ×3 IMPLANT
GLOVE EXAM NITRILE XL STR (GLOVE) IMPLANT
GLOVE SURG ENC MOIS LTX SZ6.5 (GLOVE) ×3 IMPLANT
GLOVE SURG LTX SZ9 (GLOVE) ×3 IMPLANT
GLOVE SURG UNDER POLY LF SZ6.5 (GLOVE) ×3 IMPLANT
GOWN STRL REUS W/ TWL LRG LVL3 (GOWN DISPOSABLE) IMPLANT
GOWN STRL REUS W/ TWL XL LVL3 (GOWN DISPOSABLE) ×2 IMPLANT
GOWN STRL REUS W/TWL 2XL LVL3 (GOWN DISPOSABLE) IMPLANT
GOWN STRL REUS W/TWL LRG LVL3 (GOWN DISPOSABLE)
GOWN STRL REUS W/TWL XL LVL3 (GOWN DISPOSABLE) ×2
KIT BASIN OR (CUSTOM PROCEDURE TRAY) ×3 IMPLANT
KIT TURNOVER KIT B (KITS) ×3 IMPLANT
NDL SPNL 22GX3.5 QUINCKE BK (NEEDLE) IMPLANT
NEEDLE HYPO 22GX1.5 SAFETY (NEEDLE) ×3 IMPLANT
NEEDLE SPNL 22GX3.5 QUINCKE BK (NEEDLE) IMPLANT
NS IRRIG 1000ML POUR BTL (IV SOLUTION) ×3 IMPLANT
OIL CARTRIDGE MAESTRO DRILL (MISCELLANEOUS) ×2
PACK LAMINECTOMY NEURO (CUSTOM PROCEDURE TRAY) ×3 IMPLANT
PAD ARMBOARD 7.5X6 YLW CONV (MISCELLANEOUS) ×9 IMPLANT
SPONGE SURGIFOAM ABS GEL SZ50 (HEMOSTASIS) ×3 IMPLANT
SPONGE T-LAP 4X18 ~~LOC~~+RFID (SPONGE) ×3 IMPLANT
STRIP CLOSURE SKIN 1/2X4 (GAUZE/BANDAGES/DRESSINGS) ×3 IMPLANT
SUT VIC AB 2-0 CT1 18 (SUTURE) ×4 IMPLANT
SUT VIC AB 3-0 SH 8-18 (SUTURE) ×4 IMPLANT
TOWEL GREEN STERILE (TOWEL DISPOSABLE) ×3 IMPLANT
TOWEL GREEN STERILE FF (TOWEL DISPOSABLE) ×3 IMPLANT
WATER STERILE IRR 1000ML POUR (IV SOLUTION) ×3 IMPLANT

## 2021-07-09 NOTE — H&P (Signed)
Jamie Phillips is an 81 y.o. female.   ?Chief Complaint: Back pain ?HPI: 81 year old female with back and bilateral lower extremity radicular pain failing conservative management.  Work-up demonstrates evidence of significant stenosis at L2-3, L3-4 and L4-5.  Patient has failed conservative management presents now for decompressive surgery in hopes of improving her symptoms. ? ?Past Medical History:  ?Diagnosis Date  ? Anginal pain ([place])   ? atypical chest pain  ? Anxiety   ? Arthritis   ? Back pain   ? Cancer [facility])   ? cll  ? Chronic kidney disease   ? Chronic kidney disease   ? CLL (chronic lymphocytic leukemia) ([place])   ? Depression   ? Epigastric pain   ? GERD (gastroesophageal reflux disease)   ? Headache   ? Hemorrhoids   ? Hypercholesteremia   ? Hyperlipidemia   ? Hypertension   ? 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 Macular degeneration   ? MGUS (monoclonal gammopathy of unknown significance)   ? Morbid obesity ([place])   ? Obesity   ? Pulmonary embolism ([place])   ? PVD (peripheral vascular disease) ([place])   ? Sleep apnea   ? ? ?Past Surgical History:  ?Procedure Laterality Date  ? APPENDECTOMY    ? BREAST BIOPSY Left 08/19/2004  ? lt bx/clip-neg  ? COLONOSCOPY    ? COLONOSCOPY WITH PROPOFOL N/A 03/10/2017  ? Procedure: COLONOSCOPY WITH PROPOFOL;  Surgeon: [name], MD;  Location: [facility] ENDOSCOPY;  Service: Endoscopy;  Laterality: N/A;  ? ESOPHAGOGASTRODUODENOSCOPY (EGD) WITH PROPOFOL N/A 02/02/2019  ? Procedure: ESOPHAGOGASTRODUODENOSCOPY (EGD) WITH PROPOFOL;  Surgeon: Toledo, Benay Pike, MD;  Location: [facility] ENDOSCOPY;  Service: Endoscopy;  Laterality: N/A;  ? IVC FILTER INSERTION N/A 06/11/2016  ? Procedure: IVC Filter Insertion;  Surgeon: Katha Cabal, MD;  Location: [place] CV LAB;  Service: Cardiovascular;  Laterality: N/A;  ? IVC FILTER REMOVAL N/A 05/06/2017  ? Procedure: IVC FILTER REMOVAL;  Surgeon: Katha Cabal, MD;  Location: [place] CV LAB;  Service:  Cardiovascular;  Laterality: N/A;  ? JOINT REPLACEMENT Left 06/17/2016  ? Knees, pt stated left and right have been replaced.  ? LAPAROSCOPIC APPENDECTOMY N/A 10/10/2018  ? Procedure: APPENDECTOMY LAPAROSCOPIC ATTEMPTED CONVERTED TO OPEN;  Surgeon: Herbert Pun, MD;  Location: [facility] ORS;  Service: General;  Laterality: N/A;  ? LAPAROSCOPY N/A 10/10/2018  ? Procedure: LAPAROSCOPY DIAGNOSTIC ATTEMPTED CONVERTED TO OPEN;  Surgeon: Herbert Pun, MD;  Location: [facility] ORS;  Service: General;  Laterality: N/A;  ? neck fusion    ? PARTIAL COLECTOMY Right 10/10/2018  ? Procedure: PARTIAL COLECTOMY;  Surgeon: Herbert Pun, MD;  Location: [facility] ORS;  Service: General;  Laterality: Right;  ? PERIPHERAL VASCULAR CATHETERIZATION N/A 08/15/2015  ? Procedure: pulmonary angiogram with lysis;  Surgeon: Katha Cabal, MD;  Location: [place] CV LAB;  Service: Cardiovascular;  Laterality: N/A;  ? WRIST SURGERY Left 2000  ? ? ?Family History  ?Problem Relation Age of Onset  ? Pulmonary embolism Mother   ? Sudden death Mother   ? Obesity Mother   ? Heart failure Father   ? Congestive Heart Failure Father   ? Sudden death Father   ? Breast cancer Neg Hx   ? ?Social History:  reports that she has never smoked. She has never used smokeless tobacco. She reports that she does not drink alcohol and does not use drugs. ? ?Allergies: No Known Allergies ? ?Medications Prior to Admission  ?Medication  Sig Dispense Refill  ? acetaminophen (TYLENOL) 650 MG CR tablet Take 650-1,300 mg by mouth every 8 (eight) hours as needed for pain. TYLENOL ARTHRITIS    ? b complex vitamins capsule Take 1 capsule by mouth daily.    ? calcium carbonate (OSCAL) 1500 (600 Ca) MG TABS tablet Take 1,500 mg by mouth 2 (two) times daily with a meal.    ? cholestyramine (QUESTRAN) 4 g packet Take 1 packet (4 g total) by mouth 3 (three) times daily with meals. (Patient taking differently: Take 4 g by mouth daily.) 60 each 0  ? DULoxetine  (CYMBALTA) 60 MG capsule Take 60 mg by mouth daily.    ? ELIQUIS 5 MG TABS tablet TAKE 1 TABLET TWICE DAILY (Patient taking differently: Take 5 mg by mouth 2 (two) times daily.) 180 tablet 6  ? escitalopram (LEXAPRO) 10 MG tablet Take 10 mg by mouth daily.    ? gabapentin (NEURONTIN) 400 MG capsule Take 1 capsule (400 mg total) by mouth 3 (three) times daily. 90 capsule   ? losartan-hydrochlorothiazide (HYZAAR) 100-12.5 MG tablet Take 0.5 tablets by mouth daily. Reported on 08/22/2015 30 tablet   ? magnesium gluconate (MAGONATE) 500 MG tablet Take 500 mg by mouth 2 (two) times daily.    ? Multiple Vitamins-Minerals (PRESERVISION AREDS 2 PO) Take 1 tablet by mouth in the morning and at bedtime.    ? oxyCODONE (OXY IR/ROXICODONE) 5 MG immediate release tablet Take 1 tablet (5 mg total) by mouth 3 (three) times daily as needed for severe pain. Must last 30 days. 65 tablet 0  ? [START ON 07/15/2021] oxyCODONE (OXY IR/ROXICODONE) 5 MG immediate release tablet Take 1 tablet (5 mg total) by mouth 3 (three) times daily as needed for severe pain. Must last 30 days. 65 tablet 0  ? pantoprazole (PROTONIX) 40 MG tablet Take 40 mg by mouth daily.    ? pravastatin (PRAVACHOL) 40 MG tablet Take 40 mg by mouth daily.     ? propranolol (INDERAL) 40 MG tablet Take 40 mg by mouth daily.    ? sucralfate (CARAFATE) 1 g tablet Take 1 g by mouth daily.    ? TURMERIC CURCUMIN PO Take 1 capsule by mouth in the morning and at bedtime.    ? oxyCODONE (OXY IR/ROXICODONE) 5 MG immediate release tablet Take 1 tablet (5 mg total) by mouth 3 (three) times daily as needed for severe pain. Must last 30 days. 65 tablet 0  ? ? ?No results found for this or any previous visit (from the past 48 hour(s)). ?No results found. ? ?Pertinent items noted in HPI and remainder of comprehensive ROS otherwise negative. ? ?Blood pressure (!) 160/79, pulse 86, temperature (!) 97.5 ?F (36.4 ?C), temperature source Oral, resp. rate 18, height '5\' 4"'$  (1.626 m), weight  108.9 kg, SpO2 96 %. ? ?Patient is awake and alert.  She is oriented and appropriate.  Speech is fluent.  Judgment insight are intact.  Cranial nerve function normal and motor examination 5/5 in both lower extremities.  Sensory examination some patchy distal sensory loss in both legs and feet.  Reflexes are hypoactive but symmetric.  No evidence of long track signs.  Gait is antalgic.  Posture is flexed.  Examination head ears eyes nose and throat is unremarked.  Chest and abdomen are benign.  Extremities are free from injury or deformity. 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  Plan L2-3 decompressive laminectomy and resection of synovial  cyst, L3-4, L4-5 decompressive laminectomies with foraminotomies.  Risks and benefits been explained.  Patient wishes to proceed. ? ?Josseline Reddin A Haiven Nardone ?07/09/2021, 8:00 AM ? ? ? ? ?

## 2021-07-09 NOTE — Anesthesia Postprocedure Evaluation (Signed)
Anesthesia Post Note ? ?Patient: AAVA DELAND ? ?Procedure(s) Performed: Laminectomy and Foraminotomy - Lumbar Two-Lumbar Three - Lumbar Three-Lumbar Four - Lumbar Four-Lumbar Five (Back) ? ?  ? ?Patient location during evaluation: PACU ?Anesthesia Type: General ?Level of consciousness: awake and alert ?Pain management: pain level controlled ?Vital Signs Assessment: post-procedure vital signs reviewed and stable ?Respiratory status: spontaneous breathing, nonlabored ventilation, respiratory function stable and patient connected to nasal cannula oxygen ?Cardiovascular status: blood pressure returned to baseline and stable ?Postop Assessment: no apparent nausea or vomiting ?Anesthetic complications: no ? ? ?No notable events documented. ? ?Last Vitals:  ?Vitals:  ? 07/09/21 1120 07/09/21 1144  ?BP: 126/75 (!) 127/96  ?Pulse: 87 79  ?Resp: 17 18  ?Temp: 36.9 ?C 36.5 ?C  ?SpO2: 97% 94%  ?  ?Last Pain:  ?Vitals:  ? 07/09/21 1144  ?TempSrc: Oral  ?PainSc:   ? ? ?  ?  ?  ?  ?  ?  ? ?March Rummage Merari Pion ? ? ? ? ?

## 2021-07-09 NOTE — Transfer of Care (Signed)
Immediate Anesthesia Transfer of Care Note ? ?Patient: Jamie Phillips ? ?Procedure(s) Performed: Laminectomy and Foraminotomy - Lumbar Two-Lumbar Three - Lumbar Three-Lumbar Four - Lumbar Four-Lumbar Five (Back) ? ?Patient Location: PACU ? ?Anesthesia Type:General ? ?Level of Consciousness: oriented, drowsy and patient cooperative ? ?Airway & Oxygen Therapy: Patient Spontanous Breathing and Patient connected to nasal cannula oxygen ? ?Post-op Assessment: Report given to RN and Post -op Vital signs reviewed and stable ? ?Post vital signs: Reviewed ? ?Last Vitals:  ?Vitals Value Taken Time  ?BP    ?Temp    ?Pulse    ?Resp 12 07/09/21 1034  ?SpO2    ?Vitals shown include unvalidated device data. ? ?Last Pain:  ?Vitals:  ? 07/09/21 0655  ?TempSrc:   ?PainSc: 2   ?   ? ?Patients Stated Pain Goal: 2 (07/09/21 9242) ? ?Complications: No notable events documented. ?

## 2021-07-09 NOTE — Op Note (Signed)
Date of procedure: 07/09/2021 ? ?Date of dictation: Same ? ?Service: Neurosurgery ? 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 ?Postoperative diagnosis: Same ? ?Procedure Name: L2-3 laminectomy and resection of synovial cyst, microdissection ? ?L3-4, L4-5 decompressive laminectomy with foraminotomies ? ?Surgeon:Meghanne Pletz A.Phallon Haydu, M.D. ? ?Asst. Surgeon: Reinaldo Meeker, NP ? ?Anesthesia: General ? ?Indication: 81 year old female with severe back and lower extremity pain failing conservative management.  Work-up demonstrates evidence of severe lumbar stenosis with associated general spondylolisthesis which is not grossly unstable at the L3-4 and L4-5 levels.  Patient also with a significant rightward L2-3 synovial cyst with marked stenosis.  Patient presents now for decompressive surgery and resection of synovial cyst in hopes of improving her symptoms. ? ?Operative note: After induction anesthesia, patient position prone onto Wilson frame and appropriate padded.  Lumbar region prepped and draped sterilely.  Incision made from L2-L5.  Dissection performed bilaterally.  Retractor placed.  X-ray taken.  Levels confirmed.  Decompressive laminectomies were then performed using Leksell rongeurs, Kerrison rongeurs and high-speed drill to remove the inferior two thirds of the lamina of L2 the medial aspect of the L2-3 facet joint the entire lamina of L3.  The medial aspect of the L3-4 facet joint the entire lamina of L4 and the medial aspect of the L4-5 facet joint bilaterally and the superior aspect of the L5 lamina.  Ligament flavum elevated and resected.  Foraminotomies completed on the course exiting L3, L4 and L5 nerve roots.  Attention then placed at L2-3.  Ligament was elevated.  There was an adherent synovial cyst on the right side which was dissected free using the microscope for microdissection of the spinal canal.  The cyst was completely resected.  At this point a  very thorough decompression of been achieved.  There was no evidence of injury to the thecal sac or nerve roots.  The wound was then irrigated with antibiotic solution.  Gelfoam was placed topically for hemostasis.  Wound is then closed in layers with Vicryl sutures.  A medium Hemovac drain was left in the deep wound space as well as vancomycin powder.  The patient tolerated the procedure well and she returns to the recovery room postop. ? ?

## 2021-07-09 NOTE — Anesthesia Procedure Notes (Signed)
Procedure Name: Intubation ?Date/Time: 07/09/2021 8:18 AM ?Performed by: Jenne Campus, CRNA ?Pre-anesthesia Checklist: Patient identified, Emergency Drugs available, Suction available and Patient being monitored ?Patient Re-evaluated:Patient Re-evaluated prior to induction ?Oxygen Delivery Method: Circle System Utilized ?Preoxygenation: Pre-oxygenation with 100% oxygen ?Induction Type: IV induction ?Ventilation: Mask ventilation without difficulty ?Laryngoscope Size: Sabra Heck and 2 ?Grade View: Grade I ?Tube type: Oral ?Tube size: 7.0 mm ?Number of attempts: 1 ?Airway Equipment and Method: Stylet ?Placement Confirmation: ETT inserted through vocal cords under direct vision, positive ETCO2 and breath sounds checked- equal and bilateral ?Secured at: 21 cm ?Tube secured with: Tape ?Dental Injury: Teeth and Oropharynx as per pre-operative assessment  ? ? ? ? ?

## 2021-07-09 NOTE — Brief Op Note (Signed)
07/09/2021 ? ?10:22 AM ? ?PATIENT:  Jamie Phillips  81 y.o. female ? ?PRE-OPERATIVE DIAGNOSIS:  Stenosis ? ?POST-OPERATIVE DIAGNOSIS:  Stenosis ? ?PROCEDURE:  Procedure(s): ?Laminectomy and Foraminotomy - Lumbar Two-Lumbar Three - Lumbar Three-Lumbar Four - Lumbar Four-Lumbar Five (N/A) ? ?SURGEON:  Surgeon(s) and Role: ?   Earnie Larsson, MD - Primary ? ?PHYSICIAN ASSISTANT:  ? ?ASSISTANTS: Bergman,NP  ? ?ANESTHESIA:   general ? ?EBL:  300 mL  ? ?BLOOD ADMINISTERED:none ? ?DRAINS: (med) Hemovact drain(s) in the epidural space with  Suction Open  ? ?LOCAL MEDICATIONS USED:  MARCAINE    ? ?SPECIMEN:  No Specimen ? ?DISPOSITION OF SPECIMEN:  N/A ? ?COUNTS:  YES ? ?TOURNIQUET:  * No tourniquets in log * ? ?DICTATION: .Dragon Dictation ? ?PLAN OF CARE: Admit for overnight observation ? ?PATIENT DISPOSITION:  PACU - hemodynamically stable. ?  ?Delay start of Pharmacological VTE agent (>24hrs) due to surgical blood loss or risk of bleeding: yes ? ?

## 2021-07-10 ENCOUNTER — Encounter (HOSPITAL_COMMUNITY): Payer: Self-pay | Admitting: Neurosurgery

## 2021-07-10 DIAGNOSIS — I129 Hypertensive chronic kidney disease with stage 1 through stage 4 chronic kidney disease, or unspecified chronic kidney disease: Secondary | ICD-10-CM | POA: Diagnosis not present

## 2021-07-10 DIAGNOSIS — Z96652 Presence of left artificial knee joint: Secondary | ICD-10-CM | POA: Diagnosis not present

## 2021-07-10 DIAGNOSIS — M7138 Other bursal cyst, other site: Secondary | ICD-10-CM | POA: Diagnosis not present

## 2021-07-10 DIAGNOSIS — I2699 Other pulmonary embolism without acute cor pulmonale: Secondary | ICD-10-CM | POA: Diagnosis not present

## 2021-07-10 DIAGNOSIS — N189 Chronic kidney disease, unspecified: Secondary | ICD-10-CM | POA: Diagnosis not present

## 2021-07-10 DIAGNOSIS — Z7901 Long term (current) use of anticoagulants: Secondary | ICD-10-CM | POA: Diagnosis not present

## 2021-07-10 DIAGNOSIS — M48062 Spinal stenosis, lumbar region with neurogenic claudication: Secondary | ICD-10-CM | POA: Diagnosis not present

## 2021-07-10 DIAGNOSIS — Z79899 Other long term (current) drug therapy: Secondary | ICD-10-CM | POA: Diagnosis not present

## 2021-07-10 MED ORDER — HYDROCODONE-ACETAMINOPHEN 10-325 MG PO TABS: 1.0000 | ORAL_TABLET | ORAL | 0 refills | Status: DC | PRN

## 2021-07-10 NOTE — Discharge Summary (Signed)
Physician Discharge Summary  ?Patient ID: ?Jamie Phillips ?MRN: [id number] ?DOB/AGE: [birth year] 81 y.o. ? ?Admit date: 07/09/2021 ?Discharge date: 07/10/2021 ? ?Admission Diagnoses: ? ?Discharge Diagnoses:  ?Principal Problem: ?  Lumbar stenosis with neurogenic claudication ? ? ?Discharged Condition: good ? ?Hospital Course: Patient admitted to the hospital where she underwent uncomplicated lumbar decompressive surgery.  Postop Truman Hayward she is doing very well.  Preoperative back and lower extremity pain much improved.  Standing ambulating and voiding without difficulty.  Ready for discharge home. ? ?Consults:  ? ?Significant Diagnostic Studies:  ? ?Treatments:  ? ?Discharge Exam: ?Blood pressure 122/75, pulse 84, temperature 97.9 ?F (36.6 ?C), temperature source Oral, resp. rate 18, height '5\' 4"'$  (1.626 m), weight 108.9 kg, SpO2 92 %. ?Awake and alert.  Oriented and appropriate.  Motor and sensory function intact.  Wound clean and dry.  Chest and abdomen benign. ? ?Disposition: Discharge disposition: 01-Home or Self Care ? ? ? ? ? ? ? ?Allergies as of 07/10/2021   ?No Known Allergies ?  ? ?  ?Medication List  ?  ? ?TAKE these medications   ? ?acetaminophen 650 MG CR tablet ?Commonly known as: TYLENOL ?Take 650-1,300 mg by mouth every 8 (eight) hours as needed for pain. TYLENOL ARTHRITIS ?  ?b complex vitamins capsule ?Take 1 capsule by mouth daily. ?  ?calcium carbonate 1500 (600 Ca) MG Tabs tablet ?Commonly known as: OSCAL ?Take 1,500 mg by mouth 2 (two) times daily with a meal. ?  ?cholestyramine 4 g packet ?Commonly known as: QUESTRAN ?Take 1 packet (4 g total) by mouth 3 (three) times daily with meals. ?What changed: when to take this ?  ?DULoxetine 60 MG capsule ?Commonly known as: CYMBALTA ?Take 60 mg by mouth daily. ?  ?Eliquis 5 MG Tabs tablet ?Generic drug: apixaban ?TAKE 1 TABLET TWICE DAILY ?What changed: how much to take ?  ?escitalopram 10 MG tablet ?Commonly known as: LEXAPRO ?Take 10 mg by mouth daily. ?   ?gabapentin 400 MG capsule ?Commonly known as: NEURONTIN ?Take 1 capsule (400 mg total) by mouth 3 (three) times daily. ?  ?HYDROcodone-acetaminophen 10-325 MG tablet ?Commonly known as: NORCO ?Take 1 tablet by mouth every 4 (four) hours as needed for moderate pain ((score 4 to 6)). ?  ?losartan-hydrochlorothiazide 100-12.5 MG tablet ?Commonly known as: HYZAAR ?Take 0.5 tablets by mouth daily. Reported on 08/22/2015 ?  ?magnesium gluconate 500 MG tablet ?Commonly known as: MAGONATE ?Take 500 mg by mouth 2 (two) times daily. ?  ?oxyCODONE 5 MG immediate release tablet ?Commonly known as: Oxy IR/ROXICODONE ?Take 1 tablet (5 mg total) by mouth 3 (three) times daily as needed for severe pain. Must last 30 days. ?What changed: Another medication with the same name was removed. Continue taking this medication, and follow the directions you see here. ?  ?oxyCODONE 5 MG immediate release tablet ?Commonly known as: Oxy IR/ROXICODONE ?Take 1 tablet (5 mg total) by mouth 3 (three) times daily as needed for severe pain. Must last 30 days. ?Start taking on: July 15, 2021 ?What changed: Another medication with the same name was removed. Continue taking this medication, and follow the directions you see here. ?  ?pantoprazole 40 MG tablet ?Commonly known as: PROTONIX ?Take 40 mg by mouth daily. ?  ?pravastatin 40 MG tablet ?Commonly known as: PRAVACHOL ?Take 40 mg by mouth daily. ?  ?PRESERVISION AREDS 2 PO ?Take 1 tablet by mouth in the morning and at bedtime. ?  ?propranolol 40 MG tablet ?Commonly known  asAlphia Moh ?Take 40 mg by mouth daily. ?  ?sucralfate 1 g tablet ?Commonly known as: CARAFATE ?Take 1 g by mouth daily. ?  ?TURMERIC CURCUMIN PO ?Take 1 capsule by mouth in the morning and at bedtime. ?  ? ?  ? ?  ?  ? ? ?  ?Durable Medical Equipment  ?(From admission, onward)  ?  ? ? ?  ? ?  Start     Ordered  ? 07/09/21 1150  DME Walker rolling  Once       ?Question:  Patient needs a walker to treat with the following  condition  Answer:  Lumbar stenosis with neurogenic claudication  ? 07/09/21 1149  ? 07/09/21 1150  DME 3 n 1  Once       ? 07/09/21 1149  ? ?  ?  ? ?  ? ? ? ?Signed: ?Cooper Render Jordin Vicencio ?07/10/2021, 9:36 AM ? ? ?

## 2021-07-10 NOTE — Discharge Instructions (Addendum)
Wound Care ?Keep incision covered and dry for three days.  ?Do not put any creams, lotions, or ointments on incision. ?Leave steri-strips on back.  They will fall off by themselves. ?Activity ?Walk each and every day, increasing distance each day. ?No lifting greater than 5 lbs.  Avoid excessive neck motion. ?No driving for 2 weeks; may ride as a passenger locally. ?If provided with back brace, wear when out of bed.  It is not necessary to wear brace in bed. ?Diet ?Resume your normal diet.  ? ?Call Your Doctor If Any of These Occur ?Redness, drainage, or swelling at the wound.  ?Temperature greater than 101 degrees. ?Severe pain not relieved by pain medication. ?Incision starts to come apart. ?Follow Up Appt ?Call today for appointment in 1-2 weeks ([phone]) or for problems.  If you have any hardware placed in your spine, you will need an x-ray before your appointment. ? ? ?Resume eliquis Saturday ?

## 2021-07-10 NOTE — Evaluation (Signed)
Physical Therapy Evaluation  ? ?Patient Details ?Name: Jamie Phillips ?MRN: [id number] ?DOB: [birth year] ?Today's Date: 07/10/2021 ? ?History of Present Illness ? Pt is a 81 y/o female s/p L2-5 lami on 07/09/21. PMH including anxiety, arthritis, cancer, CKD, depression, MD, HTN, morbid obesity, and PVD. ?  ?Clinical Impression ? Pt admitted with above diagnosis. At the time of PT eval, pt was able to demonstrate transfers and ambulation with gross min guard assist and RW for support. Pt was educated on precautions, brace application/wearing schedule, appropriate activity progression, and car transfer. Pt currently with functional limitations due to the deficits listed below (see PT Problem List). Pt will benefit from skilled PT to increase their independence and safety with mobility to allow discharge to the venue listed below.     ?   ? ?Recommendations for follow up therapy are one component of a multi-disciplinary discharge planning process, led by the attending physician.  Recommendations may be updated based on patient status, additional functional criteria and insurance authorization. ? ?Follow Up Recommendations No PT follow up ? ?  ?Assistance Recommended at Discharge PRN  ?Patient can return home with the following ? A little help with walking and/or transfers;Assistance with cooking/housework;Assist for transportation;Help with stairs or ramp for entrance ? ?  ?Equipment Recommendations None recommended by PT  ?Recommendations for Other Services ?    ?  ?Functional Status Assessment Patient has had a recent decline in their functional status and demonstrates the ability to make significant improvements in function in a reasonable and predictable amount of time.  ? ?  ?Precautions / Restrictions Precautions ?Precautions: Back;Fall ?Precaution Booklet Issued: Yes (comment) ?Precaution Comments: Reviewed handout and pt was cued for precautions during functional mobility. ?Required Braces or Orthoses: Spinal  Brace ?Spinal Brace: Lumbar corset;Applied in sitting position ?Restrictions ?Weight Bearing Restrictions: No  ? ?  ? ?Mobility ? Bed Mobility ?Overal bed mobility: Needs Assistance ?Bed Mobility: Rolling, Sidelying to Sit ?Rolling: Supervision ?  ?  ?  ?  ?General bed mobility comments: Supervision for safety. VC's throughout for optimal log roll technique. ?  ? ?Transfers ?Overall transfer level: Needs assistance ?Equipment used: Rolling walker (2 wheels) ?Transfers: Sit to/from Stand ?Sit to Stand: Min guard ?  ?  ?  ?  ?  ?General transfer comment: Close guard for safety as pt powered up to full stand. VC's for hand placement on seated surface for safety. ?  ? ?Ambulation/Gait ?Ambulation/Gait assistance: Min guard ?Gait Distance (Feet): 250 Feet ?Assistive device: Rolling walker (2 wheels) ?Gait Pattern/deviations: Step-through pattern, Decreased stride length, Trunk flexed, Drifts right/left ?Gait velocity: Decreased ?Gait velocity interpretation: 1.31 - 2.62 ft/sec, indicative of limited community ambulator ?  ?General Gait Details: VC's for improved posture, closer walker proximity, and forward gaze. No overt LOB noted however pt appears guarded and unsteady at times. Close guard for safety provided. ? ?Stairs ?Stairs: Yes ?Stairs assistance: Min guard ?Stair Management: One rail Right, One rail Left, Step to pattern, Forwards, Sideways ?Number of Stairs: 3 (x2) ?General stair comments: Initially attempted facing forwards but required both hands on the rails for support. Sideways, pt was able to maintain better spinal alignment. Used L railing sideways to ascend and R railing to descend. ? ?Wheelchair Mobility ?  ? ?Modified Rankin (Stroke Patients Only) ?  ? ?  ? ?Balance Overall balance assessment: Needs assistance ?Sitting-balance support: No upper extremity supported, Feet supported ?Sitting balance-Leahy Scale: Good ?  ?  ?Standing balance support: No upper extremity supported,  During functional  activity ?Standing balance-Leahy Scale: Fair ?  ?  ?  ?  ?  ?  ?  ?  ?  ?  ?  ?  ?   ? ? ? ?Pertinent Vitals/Pain Pain Assessment ?Pain Assessment: Faces ?Faces Pain Scale: Hurts a little bit ?Pain Location: back ?Pain Descriptors / Indicators: Discomfort, Grimacing ?Pain Intervention(s): Limited activity within patient's tolerance, Monitored during session, Repositioned  ? ? ?Home Living Family/patient expects to be discharged to:: Private residence ?Living Arrangements: Children (Son and granddaughter) ?Available Help at Discharge: Family;Available PRN/intermittently ?Type of Home: House ?Home Access: Stairs to enter ?Entrance Stairs-Rails: Right;Left ?Entrance Stairs-Number of Steps: 3 ?  ?Home Layout: One level ?Home Equipment: Conservation officer, nature (2 wheels);Rollator (4 wheels);BSC/3in1;Shower seat;Grab bars - toilet;Grab bars - tub/shower ?   ?  ?Prior Function Prior Level of Function : Independent/Modified Independent ?  ?  ?  ?  ?  ?  ?Mobility Comments: Using a rollator ?ADLs Comments: performing BADLs ?  ? ? ?Hand Dominance  ? Dominant Hand: Right ? ?  ?Extremity/Trunk Assessment  ? Upper Extremity Assessment ?Upper Extremity Assessment: Overall WFL for tasks assessed ?  ? ?Lower Extremity Assessment ?Lower Extremity Assessment: Generalized weakness (Consistent with pre-op diagnosis) ?  ? ?Cervical / Trunk Assessment ?Cervical / Trunk Assessment: Back Surgery  ?Communication  ? Communication: HOH  ?Cognition Arousal/Alertness: Awake/alert ?Behavior During Therapy: [facility] for tasks assessed/performed ?Overall Cognitive Status: Within Functional Limits for tasks assessed ?  ?  ?  ?  ?  ?  ?  ?  ?  ?  ?  ?  ?  ?  ?  ?  ?  ?  ?  ? ?  ?General Comments   ? ?  ?Exercises    ? ?Assessment/Plan  ?  ?PT Assessment Patient needs continued PT services  ?PT Problem List Decreased strength;Decreased activity tolerance;Decreased balance;Decreased mobility;Decreased knowledge of use of DME;Decreased safety awareness;Decreased  knowledge of precautions;Pain ? ?   ?  ?PT Treatment Interventions DME instruction;Stair training;Gait training;Functional mobility training;Therapeutic activities;Therapeutic exercise;Balance training;Patient/family education   ? ?PT Goals (Current goals can be found in the Care Plan section)  ?Acute Rehab PT Goals ?Patient Stated Goal: Home at d/c. ?PT Goal Formulation: With patient ?Time For Goal Achievement: 07/17/21 ?Potential to Achieve Goals: Good ? ?  ?Frequency Min 5X/week ?  ? ? ?Co-evaluation   ?  ?  ?  ?  ? ? ?  ?AM-PAC PT "6 Clicks" Mobility  ?Outcome Measure Help needed turning from your back to your side while in a flat bed without using bedrails?: A Little ?Help needed moving from lying on your back to sitting on the side of a flat bed without using bedrails?: A Little ?Help needed moving to and from a bed to a chair (including a wheelchair)?: A Little ?Help needed standing up from a chair using your arms (e.g., wheelchair or bedside chair)?: A Little ?Help needed to walk in hospital room?: A Little ?Help needed climbing 3-5 steps with a railing? : A Little ?6 Click Score: 18 ? ?  ?End of Session Equipment Utilized During Treatment: Gait belt;Back brace ?Activity Tolerance: Patient tolerated treatment well ?Patient left: in bed;with call bell/phone within reach;with family/visitor present ?Nurse Communication: Mobility status ?PT Visit Diagnosis: Unsteadiness on feet (R26.81);Pain ?Pain - part of body:  (back) ?  ? ?Time: 1941-7408 ?PT Time Calculation (min) (ACUTE ONLY): 22 min ? ? ?Charges:   PT Evaluation ?$PT Eval Low Complexity:  1 Low ?  ?  ?   ? ? ?Rolinda Roan, PT, DPT ?Acute Rehabilitation Services ?Pager: [phone] ?Office: [phone]  ? ?Thelma Comp ?07/10/2021, 12:40 PM ? ?

## 2021-07-10 NOTE — Progress Notes (Signed)
Occupational Therapy Evaluation ?Patient Details ?Name: Jamie Phillips ?MRN: [id number] ?DOB: Jul 06, 1940 ?Today's Date: 07/10/2021 ? ? ?History of Present Illness 81 yo female s/p L2-5 lami on 3/20. PMH including anxiety, arthritis, cancer, CKD, depression, MD, HTN, morbid obesity, and PVD.  ? ?Clinical Impression ?  ?PTA, pt was living with her son and was independent with ADLs and using a rollator for mobility. Currently, pt requires Supervision-Min Guard A for ADLs and functional mobility using RW. Provided education and handout on back precautions, brace management, bed mobility, grooming, LB ADLs, toileting, and tub transfer with shower seat; pt demonstrated and verbalized understanding. Answered all pt questions. Will continue to follow acutely as admitted to facilitate safe dc. Recommend dc home once medically stable per physician.  ?   ? ?Recommendations for follow up therapy are one component of a multi-disciplinary discharge planning process, led by the attending physician.  Recommendations may be updated based on patient status, additional functional criteria and insurance authorization.  ? ?Follow Up Recommendations ? No OT follow up  ?  ?Assistance Recommended at Discharge Intermittent Supervision/Assistance  ?Patient can return home with the following A little help with walking and/or transfers;A little help with bathing/dressing/bathroom ? ?  ?Functional Status Assessment ? Patient has had a recent decline in their functional status and demonstrates the ability to make significant improvements in function in a reasonable and predictable amount of time.  ?Equipment Recommendations ? None recommended by OT  ?  ?Recommendations for Other Services PT consult ? ? ?  ?Precautions / Restrictions Precautions ?Precautions: Back ?Precaution Booklet Issued: Yes (comment) ?Precaution Comments: Reviewed back precautions ?Required Braces or Orthoses: Spinal Brace ?Spinal Brace: Lumbar corset;Applied in sitting  position  ? ?  ? ?Mobility Bed Mobility ?Overal bed mobility: Needs Assistance ?Bed Mobility: Rolling, Sidelying to Sit ?Rolling: Supervision ?  ?  ?  ?  ?General bed mobility comments: Supervision for safety ?  ? ?Transfers ?Overall transfer level: Needs assistance ?Equipment used: None ?Transfers: Sit to/from Stand ?Sit to Stand: Min guard ?  ?  ?  ?  ?  ?General transfer comment: Min guard A for safety ?  ? ?  ?Balance Overall balance assessment: Needs assistance ?Sitting-balance support: No upper extremity supported, Feet supported ?Sitting balance-Leahy Scale: Good ?  ?  ?Standing balance support: No upper extremity supported, During functional activity ?Standing balance-Leahy Scale: Fair ?  ?  ?  ?  ?  ?  ?  ?  ?  ?  ?  ?  ?   ? ?ADL either performed or assessed with clinical judgement  ? ?ADL Overall ADL's : Needs assistance/impaired ?  ?  ?  ?  ?  ?  ?  ?  ?  ?  ?  ?  ?  ?  ?  ?  ?  ?  ?  ?General ADL Comments: Pt performing ADLs and functional mobility at [employer] A. Providing handout and education on compensatory techniques for back precautions, bed mobility, LB ADLs, brace management, grooming, toileting, and tub transfer  ? ? ? ?Vision Baseline Vision/History: 1 Wears glasses ?   ?   ?Perception   ?  ?Praxis   ?  ? ?Pertinent Vitals/Pain Pain Assessment ?Pain Assessment: Faces ?Faces Pain Scale: Hurts a little bit ?Pain Location: back ?Pain Descriptors / Indicators: Discomfort, Grimacing ?Pain Intervention(s): Monitored during session, Repositioned  ? ? ? ?Hand Dominance Right ?  ?Extremity/Trunk Assessment Upper Extremity Assessment ?Upper Extremity Assessment: Overall WFL for  tasks assessed ?  ?Lower Extremity Assessment ?Lower Extremity Assessment: Defer to PT evaluation ?  ?Cervical / Trunk Assessment ?Cervical / Trunk Assessment: Back Surgery ?  ?Communication Communication ?Communication: HOH ?  ?Cognition Arousal/Alertness: Awake/alert ?Behavior During Therapy: [facility] for tasks  assessed/performed ?Overall Cognitive Status: Within Functional Limits for tasks assessed ?  ?  ?  ?  ?  ?  ?  ?  ?  ?  ?  ?  ?  ?  ?  ?  ?  ?  ?  ?General Comments    ? ?  ?Exercises   ?  ?Shoulder Instructions    ? ? ?Home Living Family/patient expects to be discharged to:: Private residence ?Living Arrangements: Children (Son and granddaughter) ?Available Help at Discharge: Family;Available PRN/intermittently ?Type of Home: House ?Home Access: Stairs to enter ?Entrance Stairs-Number of Steps: 3 ?Entrance Stairs-Rails: Right;Left ?Home Layout: One level ?  ?  ?Bathroom Shower/Tub: Tub/shower unit ?  ?Bathroom Toilet: Standard ?  ?  ?Home Equipment: Conservation officer, nature (2 wheels);Rollator (4 wheels);BSC/3in1;Shower seat;Grab bars - toilet;Grab bars - tub/shower ?  ?  ?  ? ?  ?Prior Functioning/Environment Prior Level of Function : Independent/Modified Independent ?  ?  ?  ?  ?  ?  ?Mobility Comments: Using a rollator ?ADLs Comments: performing BADLs ?  ? ?  ?  ?OT Problem List: Decreased strength;Decreased range of motion;Decreased activity tolerance;Impaired balance (sitting and/or standing);Decreased knowledge of use of DME or AE;Decreased knowledge of precautions ?  ?   ?OT Treatment/Interventions: Self-care/ADL training;Therapeutic exercise;Energy conservation;DME and/or AE instruction;Therapeutic activities;Patient/family education  ?  ?OT Goals(Current goals can be found in the care plan section) Acute Rehab OT Goals ?Patient Stated Goal: Go home ?OT Goal Formulation: With patient ?Time For Goal Achievement: 07/24/21 ?Potential to Achieve Goals: Good ?ADL Goals ?Pt Will Perform Lower Body Dressing: with modified independence;sit to/from stand ?Pt Will Perform Tub/Shower Transfer: Tub transfer;with supervision;shower seat;ambulating ?Additional ADL Goal #1: Pt will perform bed mobility using log roll technique independently in preparation for ADLs  ?OT Frequency: Min 3X/week ?  ? ?Co-evaluation   ?  ?  ?  ?  ? ?   ?AM-PAC OT "6 Clicks" Daily Activity     ?Outcome Measure Help from another person eating meals?: None ?Help from another person taking care of personal grooming?: None ?Help from another person toileting, which includes using toliet, bedpan, or urinal?: A Little ?Help from another person bathing (including washing, rinsing, drying)?: A Little ?Help from another person to put on and taking off regular upper body clothing?: A Little ?Help from another person to put on and taking off regular lower body clothing?: A Little ?6 Click Score: 20 ?  ?End of Session Equipment Utilized During Treatment: Rolling walker (2 wheels);Back brace ?Nurse Communication: Mobility status ? ?Activity Tolerance: Patient tolerated treatment well ?Patient left: in chair;with call bell/phone within reach ? ?OT Visit Diagnosis: Unsteadiness on feet (R26.81);Other abnormalities of gait and mobility (R26.89);Muscle weakness (generalized) (M62.81);Pain ?Pain - Right/Left:  (back)  ?              ?Time: 2841-3244 ?OT Time Calculation (min): 20 min ?Charges:  OT General Charges ?$OT Visit: 1 Visit ?OT Evaluation ?$OT Eval Low Complexity: 1 Low ? ?Ilia Dimaano MSOT, OTR/L ?Acute Rehab ?Pager: [phone] ?Office: [phone] ? ?Kendahl Bumgardner M Dorlisa Savino ?07/10/2021, 7:59 AM ?

## 2021-07-10 NOTE — Progress Notes (Signed)
Patient alert and oriented, mae's well, voiding adequate amount of urine, swallowing without difficulty, no c/o pain at time of discharge. Patient discharged home with family. Script and discharged instructions given to patient. Patient and family stated understanding of instructions given. Patient has an appointment with Dr. Pool  

## 2021-07-11 ENCOUNTER — Ambulatory Visit (INDEPENDENT_AMBULATORY_CARE_PROVIDER_SITE_OTHER): Payer: Medicare HMO | Admitting: Family Medicine

## 2021-07-11 DIAGNOSIS — Z961 Presence of intraocular lens: Secondary | ICD-10-CM | POA: Diagnosis not present

## 2021-07-11 DIAGNOSIS — H52223 Regular astigmatism, bilateral: Secondary | ICD-10-CM | POA: Diagnosis not present

## 2021-07-11 DIAGNOSIS — H524 Presbyopia: Secondary | ICD-10-CM | POA: Diagnosis not present

## 2021-07-20 ENCOUNTER — Encounter: Payer: Medicare HMO | Admitting: Physical Medicine and Rehabilitation

## 2021-07-30 ENCOUNTER — Encounter: Payer: Self-pay | Admitting: Pain Medicine

## 2021-07-30 ENCOUNTER — Ambulatory Visit: Payer: Medicare HMO | Attending: Pain Medicine | Admitting: Pain Medicine

## 2021-07-30 VITALS — BP 122/81 | HR 65 | Temp 96.7°F | Ht 64.0 in | Wt 241.0 lb

## 2021-07-30 DIAGNOSIS — M545 Low back pain, unspecified: Secondary | ICD-10-CM | POA: Diagnosis not present

## 2021-07-30 DIAGNOSIS — G8929 Other chronic pain: Secondary | ICD-10-CM

## 2021-07-30 DIAGNOSIS — R1032 Left lower quadrant pain: Secondary | ICD-10-CM | POA: Diagnosis present

## 2021-07-30 DIAGNOSIS — R1031 Right lower quadrant pain: Secondary | ICD-10-CM | POA: Diagnosis not present

## 2021-07-30 DIAGNOSIS — Z79899 Other long term (current) drug therapy: Secondary | ICD-10-CM | POA: Diagnosis not present

## 2021-07-30 DIAGNOSIS — G894 Chronic pain syndrome: Secondary | ICD-10-CM | POA: Diagnosis not present

## 2021-07-30 DIAGNOSIS — Z79891 Long term (current) use of opiate analgesic: Secondary | ICD-10-CM

## 2021-07-30 DIAGNOSIS — M25552 Pain in left hip: Secondary | ICD-10-CM | POA: Diagnosis present

## 2021-07-30 DIAGNOSIS — M79605 Pain in left leg: Secondary | ICD-10-CM | POA: Insufficient documentation

## 2021-07-30 DIAGNOSIS — M79604 Pain in right leg: Secondary | ICD-10-CM | POA: Insufficient documentation

## 2021-07-30 DIAGNOSIS — M47816 Spondylosis without myelopathy or radiculopathy, lumbar region: Secondary | ICD-10-CM | POA: Diagnosis not present

## 2021-07-30 DIAGNOSIS — M25551 Pain in right hip: Secondary | ICD-10-CM | POA: Insufficient documentation

## 2021-07-30 MED ORDER — OXYCODONE HCL 5 MG PO TABS: 5.0000 mg | ORAL_TABLET | Freq: Three times a day (TID) | ORAL | 0 refills | Status: DC | PRN

## 2021-07-30 NOTE — Progress Notes (Signed)
PROVIDER NOTE: Information contained herein reflects review and annotations entered in association with encounter. Interpretation of such information and data should be left to medically-trained personnel. Information provided to patient can be located elsewhere in the medical record under "Patient Instructions". Document created using STT-dictation technology, any transcriptional errors that may result from process are unintentional.  ?  ?Patient: Jamie Phillips  Service Category: E/M  Provider: Gaspar Cola, MD  ?DOB: Mar 13, 1941  DOS: 07/30/2021  Specialty: Interventional Pain Management  ?MRN: [id number]  Setting: Ambulatory outpatient  PCP: Kirk Ruths, MD  ?Type: Established Patient    Referring Provider: Kirk Ruths, MD  ?Location: Office  Delivery: Face-to-face    ? ?HPI  ?Jamie Phillips, a 81 y.o. year old female, is here today because of her Chronic pain syndrome [G89.4]. Jamie Phillips primary complain today is Back Pain (lower) ?Last encounter: My last encounter with her was on 06/26/2021. ?Pertinent problems: Ms. 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); Abnormal MRI, lumbar spine (01/24/2020); Lumbar facet syndrome (Multilevel) (Bilateral); Lumbar facet hypertrophy (Multilevel) (Bilateral); Severe muscle deconditioning; Lower extremity weakness (Bilateral); Lumbosacral facet hypertrophy  (Multilevel) (Bilateral); Lumbar scoliosis (idiopathic); Spondylolisthesis of lumbosacral region (Multilevel); Retrolisthesis (L1/L2); Anterolisthesis of lumbar spine (L3/L4, L4/L5); Anterolisthesis of lumbosacral spine (L5/S1); Lumbar lateral recess stenosis (Bilateral: L3-4, L4-5) (Severe); Lumbar foraminal stenosis (Right: L4-5); Synovial cyst of lumbar facet joint (L3-4) (Right); Arthropathy of spinal facet joint concurrent with and due to effusion (L3-4); Annular tear of lumbar disc (L3-4) (Left); Chronic lower extremity radicular pain (L1/L2 dermatomes) (Bilateral); Chronic groin pain (Bilateral); Neurogenic pain, lower extremity (Bilateral); Shooting pain, intermittent (electrical-like); History of total knee replacement (Bilateral) ; Spondylosis without myelopathy or radiculopathy, lumbosacral region; Chronic low back pain (2ry area of Pain) (Bilateral) w/o sciatica; Abnormal EMG (electromyogram) (05/09/2020); Abnormal CT scan, lumbar spine (06/16/2020); Chronic hip pain (Bilateral); Trochanteric bursitis of hips (Bilateral); Iliopsoas bursitis of hips; Decreased range of motion of lumbar spine; Osteoarthritis of hips (Bilateral); Sacroiliac joint dysfunction of right side; Chronic right sacroiliac joint pain; 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[place]); Chronic hip pain (Right); Primary osteoarthritis of hip (Right); and Abnormal MRI, hip (Right) (12/23/2020) on their pertinent problem list. ?Pain Assessment: Severity of Chronic pain is reported as a 1 /10. Location: Back Lower/Denies. Onset: More than a month ago. Quality: Aching, Sore. Timing: Intermittent. Modifying factor(s): rest and meds. ?Vitals:  height is '5\' 4"'$  (1.626 m) and weight is 241 lb (109.3 kg). Her temperature is 96.7 ?F (35.9 ?C) (abnormal). Her blood pressure is 122/81 and her pulse is 65. Her oxygen saturation is 97%.  ? ?Reason for encounter: medication management.  One 06/26/2021 the patient had indicated that she had an appointment  with Dr. Trenton Gammon in [place].  The plan was to get a second opinion from Dr. Trenton Gammon and if surgery was indicated then to consider proceeding with it.  Today I have noticed that the patient had a prescription for hydrocodone/APAP 10/325 #30 on 07/10/2021 and prescribed by Dr. Deri Fuelling.  ? ? The patient indicates  doing well with the current medication regimen. No adverse reactions or side effects reported to the medications.  Jamie Phillips confirms having had her back surgery at [facility] on March to 20th 2023.  She refers that today it has been 3 weeks since the surgery and she is doing great.  She states that the low back pain and lower extremity lumbosacral radiculopathy is now gone and the only thing that it still bothering her is the peripheral neuropathy.  She denies having any diabetes and unfortunately this means that she is currently not a candidate for Quten[place].  Today she comes in for follow-up evaluation and medication management.  Since she is postop, we will hold on any type of interventions or changes in her medications until she has fully recovered.  Today I have informed the patient that in the near future we will go ahead and begin tapering down her opioids for the process of discontinuing them.  She indicated being perfectly okay with this. ? ?According to Dr. Irven Baltimore operative note he did an L3-4 laminectomy and resection of a synovial cyst with microdissection of L3-4, L4-5 decompressive laminectomy with foraminotomies.  She is extremely happy with the results. ? ?RTCB: 11/12/2021 ?Nonopioids transferred: Failed pending (written by Dr. Ouida Sills) ? ?Pharmacotherapy Assessment  ?Analgesic: Tramadol 50 mg, 1 tab p.o. 4 times daily (200 mg/day of tramadol) ?MME/day: 20 mg/day  ? ?Monitoring: ?[place] PMP: PDMP reviewed during this encounter.       ?Pharmacotherapy: No side-effects or adverse reactions reported. ?Compliance: No problems identified. ?Effectiveness: Clinically acceptable. ? ?Chauncey Fischer, RN  07/30/2021 11:01 AM  Sign when Signing Visit ?Nursing Pain Medication Assessment:  ?Safety precautions to be maintained throughout the outpatient stay will include: orient to surroundings, keep bed in low position, maintain call bell within reach at all times, provide assistance with transfer out of bed and ambulation.  ?Medication Inspection Compliance: Pill count conducted under aseptic conditions, in front of the patient. Neither the pills nor the bottle was removed from the patient's sight at any time. Once count was completed pills were immediately returned to the patient in their original bottle. ? ?Medication: Oxycodone IR ?Pill/Patch Count:  0 of 65 pills remain ?Pill/Patch Appearance: Markings consistent with prescribed medication ?Bottle Appearance: Standard pharmacy container. Clearly labeled. ?Filled Date: 2 / 24 / 2023 ?Last Medication intake:   few weeks ago ?   UDS:  ?Summary  ?Date Value Ref Range Status  ?07/27/2020 Note  Final  ?  Comment:  ?  ==================================================================== ?[employer] Select 13 (MW) ?==================================================================== ?Test                             Result       Flag       Units ? ?Drug Present and Declared for Prescription Verification ?  Oxycodone                      1206         EXPECTED   ng/mg creat ?  Oxymorphone                    90           EXPECTED   ng/mg creat ?  Noroxycodone                   2368         EXPECTED  ng/mg creat ?   Sources of oxycodone include scheduled prescription medications. ?   Oxymorphone and noroxycodone are expected metabolites of oxycodone. ?   Oxymorphone is also available as a scheduled prescription medication. ? ?==================================================================== ?Test                      Result    Flag   Units      Ref Range ?  Creatinine              212              mg/dL       >=20 ?==================================================================== ?Declared Medications: ? The flagging and interpretation on this report are based on the ? following declared medications.  Unexpected results may arise from ? inaccuracies in the declared medi

## 2021-07-30 NOTE — Patient Instructions (Signed)
____________________________________________________________________________________________ ? ?Medication Rules ? ?Purpose: To inform patients, and their family members, of our rules and regulations. ? ?Applies to: All patients receiving prescriptions (written or electronic). ? ?Pharmacy of record: Pharmacy where electronic prescriptions will be sent. If written prescriptions are taken to a different pharmacy, please inform the nursing staff. The pharmacy listed in the electronic medical record should be the one where you would like electronic prescriptions to be sent. ? ?Electronic prescriptions: In compliance with the [place] Strengthen Opioid Misuse Prevention (STOP) Act of 2017 (Session Law 2017-74/H243), effective April 22, 2018, all controlled substances must be electronically prescribed. Calling prescriptions to the pharmacy will cease to exist. ? ?Prescription refills: Only during scheduled appointments. Applies to all prescriptions. ? ?NOTE: The following applies primarily to controlled substances (Opioid* Pain Medications).  ? ?Type of encounter (visit): For patients receiving controlled substances, face-to-face visits are required. (Not an option or up to the patient.) ? ?Patient's responsibilities: ?Pain Pills: Bring all pain pills to every appointment (except for procedure appointments). ?Pill Bottles: Bring pills in original pharmacy bottle. Always bring the newest bottle. Bring bottle, even if empty. ?Medication refills: You are responsible for knowing and keeping track of what medications you take and those you need refilled. ?The day before your appointment: write a list of all prescriptions that need to be refilled. ?The day of the appointment: give the list to the admitting nurse. Prescriptions will be written only during appointments. No prescriptions will be written on procedure days. ?If you forget a medication: it will not be "Called in", "Faxed", or "electronically sent". You will  need to get another appointment to get these prescribed. ?No early refills. Do not call asking to have your prescription filled early. ?Prescription Accuracy: You are responsible for carefully inspecting your prescriptions before leaving our office. Have the discharge nurse carefully go over each prescription with you, before taking them home. Make sure that your name is accurately spelled, that your address is correct. Check the name and dose of your medication to make sure it is accurate. Check the number of pills, and the written instructions to make sure they are clear and accurate. Make sure that you are given enough medication to last until your next medication refill appointment. ?Taking Medication: Take medication as prescribed. When it comes to controlled substances, taking less pills or less frequently than prescribed is permitted and encouraged. ?Never take more pills than instructed. ?Never take medication more frequently than prescribed.  ?Inform other Doctors: Always inform, all of your healthcare providers, of all the medications you take. ?Pain Medication from other Providers: You are not allowed to accept any additional pain medication from any other Doctor or Healthcare provider. There are two exceptions to this rule. (see below) In the event that you require additional pain medication, you are responsible for notifying us, as stated below. ?Cough Medicine: Often these contain an opioid, such as codeine or hydrocodone. Never accept or take cough medicine containing these opioids if you are already taking an opioid* medication. The combination may cause respiratory failure and death. ?Medication Agreement: You are responsible for carefully reading and following our Medication Agreement. This must be signed before receiving any prescriptions from our practice. Safely store a copy of your signed Agreement. Violations to the Agreement will result in no further prescriptions. (Additional copies of our  Medication Agreement are available upon request.) ?Laws, Rules, & Regulations: All patients are expected to follow all Federal and State Laws, Statutes, Rules, & Regulations. Ignorance of   the Laws does not constitute a valid excuse.  ?Illegal drugs and Controlled Substances: The use of illegal substances (including, but not limited to marijuana and its derivatives) and/or the illegal use of any controlled substances is strictly prohibited. Violation of this rule may result in the immediate and permanent discontinuation of any and all prescriptions being written by our practice. The use of any illegal substances is prohibited. ?Adopted CDC guidelines & recommendations: Target dosing levels will be at or below 60 MME/day. Use of benzodiazepines** is not recommended. ? ?Exceptions: There are only two exceptions to the rule of not receiving pain medications from other Healthcare Providers. ?Exception #1 (Emergencies): In the event of an emergency (i.e.: accident requiring emergency care), you are allowed to receive additional pain medication. However, you are responsible for: As soon as you are able, call our office [phone], at any time of the day or night, and leave a message stating your name, the date and nature of the emergency, and the name and dose of the medication prescribed. In the event that your call is answered by a member of our staff, make sure to document and save the date, time, and the name of the person that took your information.  ?Exception #2 (Planned Surgery): In the event that you are scheduled by another doctor or dentist to have any type of surgery or procedure, you are allowed (for a period no longer than 30 days), to receive additional pain medication, for the acute post-op pain. However, in this case, you are responsible for picking up a copy of our "Post-op Pain Management for Surgeons" handout, and giving it to your surgeon or dentist. This document is available at our office, and  does not require an appointment to obtain it. Simply go to our office during business hours (Monday-Thursday from 8:00 AM to 4:00 PM) (Friday 8:00 AM to 12:00 Noon) or if you have a scheduled appointment with us, prior to your surgery, and ask for it by name. In addition, you are responsible for: calling our office [phone], at any time of the day or night, and leaving a message stating your name, name of your surgeon, type of surgery, and date of procedure or surgery. Failure to comply with your responsibilities may result in termination of therapy involving the controlled substances. ?Medication Agreement Violation. Following the above rules, including your responsibilities will help you in avoiding a Medication Agreement Violation (?Breaking your Pain Medication Contract?). ? ?*Opioid medications include: morphine, codeine, oxycodone, oxymorphone, hydrocodone, hydromorphone, meperidine, tramadol, tapentadol, buprenorphine, fentanyl, methadone. ?**Benzodiazepine medications include: diazepam (Valium), alprazolam (Xanax), clonazepam (Klonopine), lorazepam (Ativan), clorazepate (Tranxene), chlordiazepoxide (Librium), estazolam (Prosom), oxazepam (Serax), temazepam (Restoril), triazolam (Halcion) ?(Last updated: 01/17/2021) ?____________________________________________________________________________________________ ? ____________________________________________________________________________________________ ? ?Medication Recommendations and Reminders ? ?Applies to: All patients receiving prescriptions (written and/or electronic). ? ?Medication Rules & Regulations: These rules and regulations exist for your safety and that of others. They are not flexible and neither are we. Dismissing or ignoring them will be considered "non-compliance" with medication therapy, resulting in complete and irreversible termination of such therapy. (See document titled "Medication Rules" for more details.) In all conscience,  because of safety reasons, we cannot continue providing a therapy where the patient does not follow instructions. ? ?Pharmacy of record:  ?Definition: This is the pharmacy where your electronic prescriptions w

## 2021-07-30 NOTE — Progress Notes (Signed)
Nursing Pain Medication Assessment:  ?Safety precautions to be maintained throughout the outpatient stay will include: orient to surroundings, keep bed in low position, maintain call bell within reach at all times, provide assistance with transfer out of bed and ambulation.  ?Medication Inspection Compliance: Pill count conducted under aseptic conditions, in front of the patient. Neither the pills nor the bottle was removed from the patient's sight at any time. Once count was completed pills were immediately returned to the patient in their original bottle. ? ?Medication: Oxycodone IR ?Pill/Patch Count:  0 of 65 pills remain ?Pill/Patch Appearance: Markings consistent with prescribed medication ?Bottle Appearance: Standard pharmacy container. Clearly labeled. ?Filled Date: 2 / 24 / 2023 ?Last Medication intake:   few weeks ago ?

## 2021-07-31 ENCOUNTER — Ambulatory Visit: Payer: Medicare HMO | Admitting: Pain Medicine

## 2021-08-21 DIAGNOSIS — H353221 Exudative age-related macular degeneration, left eye, with active choroidal neovascularization: Secondary | ICD-10-CM | POA: Diagnosis not present

## 2021-08-30 DIAGNOSIS — Z1389 Encounter for screening for other disorder: Secondary | ICD-10-CM | POA: Diagnosis not present

## 2021-08-30 DIAGNOSIS — Z9989 Dependence on other enabling machines and devices: Secondary | ICD-10-CM | POA: Diagnosis not present

## 2021-08-30 DIAGNOSIS — C9110 Chronic lymphocytic leukemia of B-cell type not having achieved remission: Secondary | ICD-10-CM | POA: Diagnosis not present

## 2021-08-30 DIAGNOSIS — F325 Major depressive disorder, single episode, in full remission: Secondary | ICD-10-CM | POA: Diagnosis not present

## 2021-08-30 DIAGNOSIS — R7303 Prediabetes: Secondary | ICD-10-CM | POA: Diagnosis not present

## 2021-08-30 DIAGNOSIS — N1831 Chronic kidney disease, stage 3a: Secondary | ICD-10-CM | POA: Diagnosis not present

## 2021-08-30 DIAGNOSIS — I129 Hypertensive chronic kidney disease with stage 1 through stage 4 chronic kidney disease, or unspecified chronic kidney disease: Secondary | ICD-10-CM | POA: Diagnosis not present

## 2021-08-30 DIAGNOSIS — I2692 Saddle embolus of pulmonary artery without acute cor pulmonale: Secondary | ICD-10-CM | POA: Diagnosis not present

## 2021-08-30 DIAGNOSIS — Z0000 Encounter for general adult medical examination without abnormal findings: Secondary | ICD-10-CM | POA: Diagnosis not present

## 2021-08-30 DIAGNOSIS — E7800 Pure hypercholesterolemia, unspecified: Secondary | ICD-10-CM | POA: Diagnosis not present

## 2021-08-30 DIAGNOSIS — G4733 Obstructive sleep apnea (adult) (pediatric): Secondary | ICD-10-CM | POA: Diagnosis not present

## 2021-09-28 ENCOUNTER — Inpatient Hospital Stay: Payer: Medicare HMO | Attending: Internal Medicine

## 2021-09-28 ENCOUNTER — Inpatient Hospital Stay: Payer: Medicare HMO | Admitting: Internal Medicine

## 2021-09-28 ENCOUNTER — Other Ambulatory Visit: Payer: Self-pay

## 2021-09-28 ENCOUNTER — Encounter: Payer: Self-pay | Admitting: Internal Medicine

## 2021-09-28 DIAGNOSIS — C9111 Chronic lymphocytic leukemia of B-cell type in remission: Secondary | ICD-10-CM | POA: Insufficient documentation

## 2021-09-28 DIAGNOSIS — I739 Peripheral vascular disease, unspecified: Secondary | ICD-10-CM | POA: Diagnosis not present

## 2021-09-28 DIAGNOSIS — G8929 Other chronic pain: Secondary | ICD-10-CM | POA: Diagnosis not present

## 2021-09-28 DIAGNOSIS — E785 Hyperlipidemia, unspecified: Secondary | ICD-10-CM | POA: Insufficient documentation

## 2021-09-28 DIAGNOSIS — Z86718 Personal history of other venous thrombosis and embolism: Secondary | ICD-10-CM | POA: Diagnosis not present

## 2021-09-28 DIAGNOSIS — E7800 Pure hypercholesterolemia, unspecified: Secondary | ICD-10-CM | POA: Insufficient documentation

## 2021-09-28 DIAGNOSIS — I129 Hypertensive chronic kidney disease with stage 1 through stage 4 chronic kidney disease, or unspecified chronic kidney disease: Secondary | ICD-10-CM | POA: Insufficient documentation

## 2021-09-28 DIAGNOSIS — M549 Dorsalgia, unspecified: Secondary | ICD-10-CM | POA: Diagnosis not present

## 2021-09-28 DIAGNOSIS — K219 Gastro-esophageal reflux disease without esophagitis: Secondary | ICD-10-CM | POA: Diagnosis not present

## 2021-09-28 DIAGNOSIS — Z79899 Other long term (current) drug therapy: Secondary | ICD-10-CM | POA: Insufficient documentation

## 2021-09-28 DIAGNOSIS — Z86711 Personal history of pulmonary embolism: Secondary | ICD-10-CM | POA: Insufficient documentation

## 2021-09-28 DIAGNOSIS — C9110 Chronic lymphocytic leukemia of B-cell type not having achieved remission: Secondary | ICD-10-CM | POA: Diagnosis not present

## 2021-09-28 DIAGNOSIS — M2550 Pain in unspecified joint: Secondary | ICD-10-CM | POA: Insufficient documentation

## 2021-09-28 LAB — COMPREHENSIVE METABOLIC PANEL
ALT: 12 U/L (ref 0–44)
AST: 17 U/L (ref 15–41)
Albumin: 3.7 g/dL (ref 3.5–5.0)
Alkaline Phosphatase: 93 U/L (ref 38–126)
Anion gap: 8 (ref 5–15)
BUN: 26 mg/dL — ABNORMAL HIGH (ref 8–23)
CO2: 31 mmol/L (ref 22–32)
Calcium: 9 mg/dL (ref 8.9–10.3)
Chloride: 102 mmol/L (ref 98–111)
Creatinine, Ser: 0.99 mg/dL (ref 0.44–1.00)
GFR, Estimated: 58 mL/min — ABNORMAL LOW (ref >60)
Glucose, Bld: 113 mg/dL — ABNORMAL HIGH (ref 70–99)
Potassium: 3.7 mmol/L (ref 3.5–5.1)
Sodium: 141 mmol/L (ref 135–145)
Total Bilirubin: 0.6 mg/dL (ref 0.3–1.2)
Total Protein: 6.6 g/dL (ref 6.5–8.1)

## 2021-09-28 LAB — CBC WITH DIFFERENTIAL/PLATELET
Abs Immature Granulocytes: 0.03 10*3/uL (ref 0.00–0.07)
Basophils Absolute: 0.1 10*3/uL (ref 0.0–0.1)
Basophils Relative: 1 %
Eosinophils Absolute: 0.3 10*3/uL (ref 0.0–0.5)
Eosinophils Relative: 2 %
HCT: 40.7 % (ref 36.0–46.0)
Hemoglobin: 13 g/dL (ref 12.0–15.0)
Immature Granulocytes: 0 %
Lymphocytes Relative: 65 %
Lymphs Abs: 9.1 10*3/uL — ABNORMAL HIGH (ref 0.7–4.0)
MCH: 30.6 pg (ref 26.0–34.0)
MCHC: 31.9 g/dL (ref 30.0–36.0)
MCV: 95.8 fL (ref 80.0–100.0)
Monocytes Absolute: 0.8 10*3/uL (ref 0.1–1.0)
Monocytes Relative: 5 %
Neutro Abs: 3.8 10*3/uL (ref 1.7–7.7)
Neutrophils Relative %: 27 %
Platelets: 271 10*3/uL (ref 150–400)
RBC: 4.25 MIL/uL (ref 3.87–5.11)
RDW: 14.7 % (ref 11.5–15.5)
Smear Review: NORMAL
WBC: 14 10*3/uL — ABNORMAL HIGH (ref 4.0–10.5)
nRBC: 0 % (ref 0.0–0.2)

## 2021-09-28 LAB — LACTATE DEHYDROGENASE: LDH: 147 U/L (ref 98–192)

## 2021-09-28 MED ORDER — APIXABAN 5 MG PO TABS: 5.0000 mg | ORAL_TABLET | Freq: Two times a day (BID) | ORAL | 3 refills | Status: DC

## 2021-09-28 NOTE — Assessment & Plan Note (Addendum)
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  Continue Eliquis- STABLE.   # Chronic low-grade B-cell lymphoproliferative disorder/CLL-hemoglobin platelets normal.  Platelets slightly elevated 14 absolute lymphocyte count 4-5,000.  Asymptomatic- STABLE.   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  Interestingly May 2020-negative.  Again repeat in 6 months.  Pending today  # fatigue-history of obstructive sleep apnea/continue CPAP.  #Chronic joint pain back pain-defer to pain clinic orthopedic.  # DISPOSITION: # follow up in last week of JAN MD labs-[ cbc/cmp/ldh/MM panel; K-light chains]-Dr.B

## 2021-09-28 NOTE — Progress Notes (Signed)
Had back surgery with Dr. Gwendolyn Lima, 6 weeks ago.

## 2021-09-28 NOTE — Progress Notes (Signed)
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 S/p IV Filter explantation.    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  2) CD56 expression on monocytes.   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  --------------------------------------------------------  DIAGNOSIS: CLL  STAGE: I        ;GOALS: COntrol  CURRENT/MOST RECENT THERAPY: surveillaince    CLL (chronic lymphocytic leukemia) (HCC)     INTERVAL HISTORY:  A pleasant 81 year old female patient with above history of History of CLL- on surveillance; and history of bilateral PE- on Eliquis is here for follow-up.  Had back surgery with Dr. Gwendolyn Lima, 6 weeks ago. Significant improvement of back pain..   Patient continues to have chronic joint pains back pain.  Not any worse.  She denies any blood in stools or black or stools.    No night sweats.  No fevers.  No new lumps or bumps.  Chronic fatigue.   Review of Systems  Constitutional:  Positive for malaise/fatigue. Negative for chills, diaphoresis, fever and weight loss.  HENT:  Negative for nosebleeds and sore throat.    Eyes:  Negative for double vision.  Respiratory:  Negative for cough, hemoptysis, sputum production, shortness of breath and wheezing.   Cardiovascular:  Negative for chest pain, palpitations, orthopnea and leg swelling.  Gastrointestinal:  Negative for abdominal pain, blood in stool, constipation, diarrhea, heartburn, melena, nausea and vomiting.  Genitourinary:  Negative for dysuria, frequency and urgency.  Musculoskeletal:  Positive for back pain, joint pain and neck pain.  Skin: Negative.  Negative for itching and rash.  Neurological:  Negative for dizziness, tingling, focal weakness, weakness and headaches.  Endo/Heme/Allergies:  Does not bruise/bleed easily.  Psychiatric/Behavioral:  Negative for depression. The patient is not nervous/anxious and does not have insomnia.     PAST MEDICAL HISTORY :  Past Medical History:  Diagnosis Date  . Anginal pain ([place])    atypical chest pain  . Anxiety   . Arthritis   . Back pain   . Cancer ([place])    cll  . Chronic kidney disease   . Chronic kidney disease   . CLL (chronic lymphocytic leukemia) ([place])   . Depression   . Epigastric pain   . GERD (gastroesophageal reflux disease)   . Headache   . Hemorrhoids   . Hypercholesteremia   . Hyperlipidemia   . Hypertension   . 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 Macular degeneration   . MGUS (monoclonal gammopathy of unknown significance)   . Morbid obesity ([place])   . Obesity   . Pulmonary embolism ([place])   . PVD (peripheral vascular disease) ([place])   . Sleep apnea     PAST SURGICAL HISTORY :   Past Surgical History:  Procedure Laterality Date  . APPENDECTOMY    . BREAST BIOPSY Left 08/19/2004   lt bx/clip-neg  . COLONOSCOPY    . COLONOSCOPY WITH PROPOFOL N/A 03/10/2017   Procedure: COLONOSCOPY WITH PROPOFOL;  Surgeon: [name], MD;  Location: [facility] ENDOSCOPY;  Service: Endoscopy;  Laterality: N/A;  . ESOPHAGOGASTRODUODENOSCOPY (EGD) WITH PROPOFOL N/A  02/02/2019   Procedure: ESOPHAGOGASTRODUODENOSCOPY (EGD) WITH PROPOFOL;  Surgeon: Toledo, Benay Pike, MD;  Location: [facility] ENDOSCOPY;  Service: Endoscopy;  Laterality: N/A;  . IVC FILTER INSERTION N/A 06/11/2016   Procedure: IVC Filter Insertion;  Surgeon: Katha Cabal, MD;  Location: [place] CV LAB;  Service: Cardiovascular;  Laterality: N/A;  . IVC FILTER REMOVAL N/A 05/06/2017   Procedure: IVC FILTER REMOVAL;  Surgeon: Katha Cabal, MD;  Location: [place] CV LAB;  Service: Cardiovascular;  Laterality: N/A;  . JOINT REPLACEMENT Left 06/17/2016   Knees, pt stated left and right have been replaced.  Marland Kitchen LAPAROSCOPIC APPENDECTOMY N/A 10/10/2018   Procedure: APPENDECTOMY LAPAROSCOPIC ATTEMPTED CONVERTED TO OPEN;  Surgeon: Herbert Pun, MD;  Location: [facility] ORS;  Service: General;  Laterality: N/A;  . LAPAROSCOPY N/A 10/10/2018   Procedure: LAPAROSCOPY DIAGNOSTIC ATTEMPTED CONVERTED TO OPEN;  Surgeon: Herbert Pun, MD;  Location: [facility] ORS;  Service: General;  Laterality: N/A;  . LUMBAR LAMINECTOMY/DECOMPRESSION MICRODISCECTOMY N/A 07/09/2021   Procedure: Laminectomy and Foraminotomy - Lumbar Two-Lumbar Three - Lumbar Three-Lumbar Four - Lumbar Four-Lumbar Five;  Surgeon: Earnie Larsson, MD;  Location: [place];  Service: Neurosurgery;  Laterality: N/A;  . neck fusion    . PARTIAL COLECTOMY Right 10/10/2018   Procedure: PARTIAL COLECTOMY;  Surgeon: Herbert Pun, MD;  Location: [facility] ORS;  Service: General;  Laterality: Right;  . PERIPHERAL VASCULAR CATHETERIZATION N/A 08/15/2015   Procedure: pulmonary angiogram with lysis;  Surgeon: Katha Cabal, MD;  Location: [place] CV LAB;  Service: Cardiovascular;  Laterality: N/A;  . WRIST SURGERY Left 2000    FAMILY HISTORY :   Family History  Problem Relation Age of Onset  . Pulmonary embolism Mother   . Sudden death Mother   . Obesity Mother   . Heart failure Father   . Congestive Heart Failure Father    . Sudden death Father   . Thyroid cancer Brother   . Breast cancer Neg Hx     SOCIAL HISTORY:   Social History   Tobacco Use  . Smoking status: Never  . Smokeless tobacco: Never  Vaping Use  . Vaping Use: Never used  Substance Use Topics  . Alcohol use: No    Alcohol/week: 0.0 standard drinks of alcohol  . Drug use: No    ALLERGIES:  has No Known Allergies.  MEDICATIONS:  Current Outpatient Medications  Medication Sig Dispense Refill  . acetaminophen (TYLENOL) 650 MG CR tablet Take 650-1,300 mg by mouth every 8 (eight) hours as needed for pain. TYLENOL ARTHRITIS    . b complex vitamins capsule Take 1 capsule by mouth daily.    . calcium carbonate (OSCAL) 1500 (600 Ca) MG TABS tablet Take 1,500 mg by mouth 2 (two) times daily with a meal.    . cholestyramine (QUESTRAN) 4 g packet Take 1 packet (4 g total) by mouth 3 (three) times daily with meals. (Patient taking differently: Take 4 g by mouth daily.) 60 each 0  . DULoxetine (CYMBALTA) 60 MG capsule Take 60 mg by mouth daily.    Marland Kitchen ELIQUIS 5 MG TABS tablet TAKE 1 TABLET TWICE DAILY (Patient taking differently:  Take 5 mg by mouth 2 (two) times daily.) 180 tablet 6  . escitalopram (LEXAPRO) 10 MG tablet Take 10 mg by mouth daily.    Marland Kitchen gabapentin (NEURONTIN) 400 MG capsule Take 1 capsule (400 mg total) by mouth 3 (three) times daily. 90 capsule   . losartan-hydrochlorothiazide (HYZAAR) 100-12.5 MG tablet Take 0.5 tablets by mouth daily. Reported on 08/22/2015 30 tablet   . magnesium gluconate (MAGONATE) 500 MG tablet Take 500 mg by mouth 2 (two) times daily.    . Multiple Vitamins-Minerals (PRESERVISION AREDS 2 PO) Take 1 tablet by mouth in the morning and at bedtime.    . pantoprazole (PROTONIX) 40 MG tablet Take 40 mg by mouth daily.    . pravastatin (PRAVACHOL) 40 MG tablet Take 1 tablet by mouth daily.    . propranolol (INDERAL) 40 MG tablet Take 40 mg by mouth daily.    . sucralfate (CARAFATE) 1 g tablet Take 1 g by mouth daily.     . TURMERIC CURCUMIN PO Take 1 capsule by mouth in the morning and at bedtime.     No current facility-administered medications for this visit.    PHYSICAL EXAMINATION:   BP 110/67 (BP Location: Right Arm, Patient Position: Sitting, Cuff Size: Large)   Pulse 67   Temp (!) 95.5 F (35.3 C) (Tympanic)   Ht '5\' 4"'$  (1.626 m)   Wt 237 lb 12.8 oz (107.9 kg)   SpO2 97%   BMI 40.82 kg/m   Filed Weights   09/28/21 1032  Weight: 237 lb 12.8 oz (107.9 kg)    Physical Exam Constitutional:      Comments: Obese.  Walks by herself.  HENT:     Head: Normocephalic and atraumatic.     Mouth/Throat:     Pharynx: No oropharyngeal exudate.  Eyes:     Pupils: Pupils are equal, round, and reactive to light.  Cardiovascular:     Rate and Rhythm: Normal rate and regular rhythm.  Pulmonary:     Effort: No respiratory distress.     Breath sounds: No wheezing.  Abdominal:     General: Bowel sounds are normal. There is no distension.     Palpations: Abdomen is soft. There is no mass.     Tenderness: There is no abdominal tenderness. There is no guarding or rebound.  Musculoskeletal:        General: No tenderness. Normal range of motion.     Cervical back: Normal range of motion and neck supple.  Skin:    General: Skin is warm.     Comments: Chronic bruises.  Neurological:     Mental Status: She is alert and oriented to person, place, and time.  Psychiatric:        Mood and Affect: Affect normal.     LABORATORY DATA:  I have reviewed the data as listed    Component Value Date/Time   NA 141 09/28/2021 1019   NA 142 12/26/2020 1112   NA 141 04/29/2012 1822   K 3.7 09/28/2021 1019   K 3.9 04/29/2012 1822   CL 102 09/28/2021 1019   CL 107 04/29/2012 1822   CO2 31 09/28/2021 1019   CO2 28 04/29/2012 1822   GLUCOSE 113 (H) 09/28/2021 1019   GLUCOSE 129 (H) 04/29/2012 1822   BUN 26 (H) 09/28/2021 1019   BUN 15 12/26/2020 1112   BUN 21 (H) 04/29/2012 1822   CREATININE 0.99  09/28/2021 1019   CREATININE 1.02 (H) 07/28/2014 9675  CALCIUM 9.0 09/28/2021 1019   CALCIUM 8.7 04/29/2012 1822   PROT 6.6 09/28/2021 1019   PROT 5.8 (L) 12/26/2020 1112   PROT 7.1 04/29/2012 1822   ALBUMIN 3.7 09/28/2021 1019   ALBUMIN 4.0 12/26/2020 1112   ALBUMIN 3.3 (L) 04/29/2012 1822   AST 17 09/28/2021 1019   AST 20 04/29/2012 1822   ALT 12 09/28/2021 1019   ALT 33 04/29/2012 1822   ALKPHOS 93 09/28/2021 1019   ALKPHOS 169 (H) 04/29/2012 1822   BILITOT 0.6 09/28/2021 1019   BILITOT 0.3 12/26/2020 1112   BILITOT 0.3 04/29/2012 1822   GFRNONAA 58 (L) 09/28/2021 1019   GFRNONAA 55 (L) 07/28/2014 0950   GFRAA 55 (L) 09/03/2019 0830   GFRAA >60 07/28/2014 0950    No results found for: "SPEP", "UPEP"  Lab Results  Component Value Date   WBC 14.0 (H) 09/28/2021   NEUTROABS PENDING 09/28/2021   HGB 13.0 09/28/2021   HCT 40.7 09/28/2021   MCV 95.8 09/28/2021   PLT 271 09/28/2021      Chemistry      Component Value Date/Time   NA 141 09/28/2021 1019   NA 142 12/26/2020 1112   NA 141 04/29/2012 1822   K 3.7 09/28/2021 1019   K 3.9 04/29/2012 1822   CL 102 09/28/2021 1019   CL 107 04/29/2012 1822   CO2 31 09/28/2021 1019   CO2 28 04/29/2012 1822   BUN 26 (H) 09/28/2021 1019   BUN 15 12/26/2020 1112   BUN 21 (H) 04/29/2012 1822   CREATININE 0.99 09/28/2021 1019   CREATININE 1.02 (H) 07/28/2014 0950      Component Value Date/Time   CALCIUM 9.0 09/28/2021 1019   CALCIUM 8.7 04/29/2012 1822   ALKPHOS 93 09/28/2021 1019   ALKPHOS 169 (H) 04/29/2012 1822   AST 17 09/28/2021 1019   AST 20 04/29/2012 1822   ALT 12 09/28/2021 1019   ALT 33 04/29/2012 1822   BILITOT 0.6 09/28/2021 1019   BILITOT 0.3 12/26/2020 1112   BILITOT 0.3 04/29/2012 1822        ASSESSMENT & PLAN:   No problem-specific Assessment & Plan notes found for this encounter.    Cammie Sickle, MD 09/28/2021 10:54 AM

## 2021-10-01 LAB — KAPPA/LAMBDA LIGHT CHAINS
Kappa free light chain: 46 mg/L — ABNORMAL HIGH (ref 3.3–19.4)
Kappa, lambda light chain ratio: 4.89 — ABNORMAL HIGH (ref 0.26–1.65)
Lambda free light chains: 9.4 mg/L (ref 5.7–26.3)

## 2021-10-02 LAB — MULTIPLE MYELOMA PANEL, SERUM
Albumin SerPl Elph-Mcnc: 3.4 g/dL (ref 2.9–4.4)
Albumin/Glob SerPl: 1.4 (ref 0.7–1.7)
Alpha 1: 0.2 g/dL (ref 0.0–0.4)
Alpha2 Glob SerPl Elph-Mcnc: 0.8 g/dL (ref 0.4–1.0)
B-Globulin SerPl Elph-Mcnc: 1.2 g/dL (ref 0.7–1.3)
Gamma Glob SerPl Elph-Mcnc: 0.3 g/dL — ABNORMAL LOW (ref 0.4–1.8)
Globulin, Total: 2.6 g/dL (ref 2.2–3.9)
IgA: 355 mg/dL (ref 64–422)
IgG (Immunoglobin G), Serum: 475 mg/dL — ABNORMAL LOW (ref 586–1602)
IgM (Immunoglobulin M), Srm: 51 mg/dL (ref 26–217)
Total Protein ELP: 6 g/dL (ref 6.0–8.5)

## 2021-10-18 DIAGNOSIS — M48061 Spinal stenosis, lumbar region without neurogenic claudication: Secondary | ICD-10-CM | POA: Diagnosis not present

## 2021-10-31 DIAGNOSIS — M48061 Spinal stenosis, lumbar region without neurogenic claudication: Secondary | ICD-10-CM | POA: Diagnosis not present

## 2021-10-31 DIAGNOSIS — R531 Weakness: Secondary | ICD-10-CM | POA: Diagnosis not present

## 2021-11-04 NOTE — Progress Notes (Unsigned)
PROVIDER NOTE: Information contained herein reflects review and annotations entered in association with encounter. Interpretation of such information and data should be left to medically-trained personnel. Information provided to patient can be located elsewhere in the medical record under "Patient Instructions". Document created using STT-dictation technology, any transcriptional errors that may result from process are unintentional.    Patient: Jamie Phillips  Service Category: E/M  Provider: Gaspar Cola, MD  DOB: June 22, 1940  DOS: 11/05/2021  Specialty: Interventional Pain Management  MRN: [id number]  Setting: Ambulatory outpatient  PCP: Kirk Ruths, MD  Type: Established Patient    Referring Provider: Kirk Ruths, MD  Location: Office  Delivery: Face-to-face     HPI  Ms. Jamie Phillips, a 81 y.o. year old female, is here today because of her No primary diagnosis found.. Ms. Jamie Phillips's primary complain today is No chief complaint on file. Last encounter: My last encounter with her was on 07/30/2021. Pertinent problems: Ms. 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); Abnormal MRI, lumbar spine (01/24/2020); Lumbar facet syndrome (Multilevel) (Bilateral); Lumbar facet hypertrophy (Multilevel) (Bilateral); Severe muscle deconditioning; Lower extremity weakness (Bilateral); Lumbosacral facet  hypertrophy (Multilevel) (Bilateral); Lumbar scoliosis (idiopathic); Spondylolisthesis of lumbosacral region (Multilevel); Retrolisthesis (L1/L2); Anterolisthesis of lumbar spine (L3/L4, L4/L5); Anterolisthesis of lumbosacral spine (L5/S1); Lumbar lateral recess stenosis (Bilateral: L3-4, L4-5) (Severe); Lumbar foraminal stenosis (Right: L4-5); Synovial cyst of lumbar facet joint (L3-4) (Right); Arthropathy of spinal facet joint concurrent with and due to effusion (L3-4); Annular tear of lumbar disc (L3-4) (Left); Chronic lower extremity radicular pain (L1/L2 dermatomes) (Bilateral); Chronic groin pain (Bilateral); Neurogenic pain, lower extremity (Bilateral); Shooting pain, intermittent (electrical-like); History of total knee replacement (Bilateral) ; Spondylosis without myelopathy or radiculopathy, lumbosacral region; Chronic low back pain (2ry area of Pain) (Bilateral) w/o sciatica; Abnormal EMG (electromyogram) (05/09/2020); Abnormal CT scan, lumbar spine (06/16/2020); Chronic hip pain (Bilateral); Trochanteric bursitis of hips (Bilateral); Iliopsoas bursitis of hips; Decreased range of motion of lumbar spine; Osteoarthritis of hips (Bilateral); Sacroiliac joint dysfunction of right side; Chronic right sacroiliac joint pain; 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[place]); Chronic hip pain (Right); Primary osteoarthritis of hip (Right); Abnormal MRI, hip (Right) (12/23/2020); and Lumbar stenosis with neurogenic claudication on their pertinent problem list. Pain Assessment: Severity of   is reported as a  /10. Location:    / . Onset:  . Quality:  . Timing:  . Modifying factor(s):  Marland Kitchen Vitals:  vitals were not taken for this visit.   Reason for encounter: medication management. ***  Pharmacotherapy Assessment  Analgesic: Tramadol 50 mg, 1 tab p.o. 4 times daily (200 mg/day of tramadol) MME/day: 20 mg/day   Monitoring: [place] PMP: PDMP reviewed during this encounter.       Pharmacotherapy: No side-effects or  adverse reactions reported. Compliance: No problems identified. Effectiveness: Clinically acceptable.  No notes on file  UDS:  Summary  Date Value Ref Range Status  07/27/2020 Note  Final    Comment:    ==================================================================== [employer] (MW) ==================================================================== Test  Result       Flag       Units  Drug Present and Declared for Prescription Verification   Oxycodone                      1206         EXPECTED   ng/mg creat   Oxymorphone                    90           EXPECTED   ng/mg creat   Noroxycodone                   2368         EXPECTED   ng/mg creat    Sources of oxycodone include scheduled prescription medications.    Oxymorphone and noroxycodone are expected metabolites of oxycodone.    Oxymorphone is also available as a scheduled prescription medication.  ==================================================================== Test                      Result    Flag   Units      Ref Range   Creatinine              212              mg/dL      >=20 ==================================================================== Declared Medications:  The flagging and interpretation on this report are based on the  following declared medications.  Unexpected results may arise from  inaccuracies in the declared medications.   **Note: The testing scope of this panel includes these medications:   Oxycodone   **Note: The testing scope of this panel does not include the  following reported medications:   Acetaminophen (Tylenol)  Albuterol (Ventolin HFA)  Apixaban (Eliquis)  Cholestyramine (Questran)  Duloxetine (Cymbalta)  Escitalopram (Lexapro)  Gabapentin (Neurontin)  Hydrochlorothiazide (Hyzaar)  Losartan (Hyzaar)  Pantoprazole (Protonix)  Pravastatin (Pravachol)  Sucralfate  (Carafate) ==================================================================== For clinical consultation, please call [phone]. ====================================================================      ROS  Constitutional: Denies any fever or chills Gastrointestinal: No reported hemesis, hematochezia, vomiting, or acute GI distress Musculoskeletal: Denies any acute onset joint swelling, redness, loss of ROM, or weakness Neurological: No reported episodes of acute onset apraxia, aphasia, dysarthria, agnosia, amnesia, paralysis, loss of coordination, or loss of consciousness  Medication Review  DULoxetine, Multiple Vitamins-Minerals, Turmeric, acetaminophen, apixaban, b complex vitamins, calcium carbonate, cholestyramine, escitalopram, gabapentin, losartan-hydrochlorothiazide, magnesium gluconate, pantoprazole, pravastatin, propranolol, and sucralfate  History Review  Allergy: Ms. Jamie Phillips has No Known Allergies. Drug: Ms. Jamie Phillips  reports no history of drug use. Alcohol:  reports no history of alcohol use. Tobacco:  reports that she has never smoked. She has never used smokeless tobacco. Social: Ms. Jamie Phillips  reports that she has never smoked. She has never used smokeless tobacco. She reports that she does not drink alcohol and does not use drugs. 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 Surgical: Ms. Jamie Phillips  has a past surgical history that includes neck  fusion; Wrist surgery (Left, 2000); Cardiac catheterization (N/A, 08/15/2015);  IVC FILTER INSERTION (N/A, 06/11/2016); Colonoscopy; Joint replacement (Left, 06/17/2016); Colonoscopy with propofol (N/A, 03/10/2017); IVC FILTER REMOVAL (N/A, 05/06/2017); laparoscopy (N/A, 10/10/2018); laparoscopic appendectomy (N/A, 10/10/2018); Partial colectomy (Right, 10/10/2018); Appendectomy; Esophagogastroduodenoscopy (egd) with propofol (N/A, 02/02/2019); Breast biopsy (Left, 08/19/2004); and Lumbar laminectomy/decompression microdiscectomy (N/A, 07/09/2021). Family: family history includes Congestive Heart Failure in her father; Heart failure in her father; Obesity in her mother; Pulmonary embolism in her mother; Sudden death in her father and mother; Thyroid cancer in her brother.  Laboratory Chemistry Profile   Renal Lab Results  Component Value Date   BUN 26 (H) 09/28/2021   CREATININE 0.99 09/28/2021   BCR 17 12/26/2020   GFRAA 55 (L) 09/03/2019   GFRNONAA 58 (L) 09/28/2021    Hepatic Lab Results  Component Value Date   AST 17 09/28/2021   ALT 12 09/28/2021   ALBUMIN 3.7 09/28/2021   ALKPHOS 93 09/28/2021   LIPASE 37 10/09/2018    Electrolytes Lab Results  Component Value Date   NA 141 09/28/2021   K 3.7 09/28/2021   CL 102 09/28/2021   CALCIUM 9.0 09/28/2021   MG 1.9 06/21/2020    Bone Lab Results  Component Value Date   VD25OH 28.9 (L) 12/26/2020   25OHVITD1 14 (L) 06/21/2020   25OHVITD2 <1.0 06/21/2020   25OHVITD3 14 06/21/2020    Inflammation (CRP: Acute Phase) (ESR: Chronic Phase) Lab Results  Component Value Date   CRP 9 06/21/2020   ESRSEDRATE 21 06/21/2020   LATICACIDVEN 0.9 12/13/2018         Note: Above Lab results reviewed.  Recent Imaging Review  DG Lumbar Spine 2-3 Views CLINICAL DATA:  Intraoperative localization.  EXAM: LUMBAR SPINE - 2-3 VIEW  COMPARISON:  MR lumbar spine and lumbar spine series 06/26/2021.  FINDINGS: Surgical instrument tip projects  over the L3 facet joints.  Second image, taken at 0848 hours, shows a surgical instrument tip projecting over the L4-5 facet joints. Overall, osseous detail is degraded by technique.  IMPRESSION: Intraoperative localization, as above.  Electronically Signed   By: Lorin Picket M.D.   On: 07/09/2021 13:08 Note: Reviewed        Physical Exam  General appearance: Well nourished, well developed, and well hydrated. In no apparent acute distress Mental status: Alert, oriented x 3 (person, place, & time)       Respiratory: No evidence of acute respiratory distress Eyes: PERLA Vitals: There were no vitals taken for this visit. BMI: Estimated body mass index is 40.82 kg/m as calculated from the following:   Height as of 09/28/21: '5\' 4"'  (1.626 m).   Weight as of 09/28/21: 237 lb 12.8 oz (107.9 kg). Ideal: Patient weight not recorded  Assessment   Diagnosis Status  1. Chronic lower extremity pain (1ry area of Pain) (Bilateral)   2. Chronic pain syndrome   3. Pharmacologic therapy   4. Chronic use of opiate for therapeutic purpose   5. Chronic low back pain (2ry area of Pain) (Bilateral) w/o sciatica   6. Lumbar facet syndrome (Multilevel) (Bilateral)   7. Chronic groin pain (Bilateral)   8. Chronic hip pain (Bilateral)   9. Encounter for medication management   10. Encounter for chronic pain management    Controlled Controlled Controlled   Updated Problems: No problems updated.  Plan of Care  Problem-specific:  No problem-specific Assessment & Plan notes found for this encounter.  Ms. Jamie Phillips has a current medication list which includes the following long-term medication(s): apixaban, calcium carbonate,  cholestyramine, duloxetine, escitalopram, gabapentin, and losartan-hydrochlorothiazide.  Pharmacotherapy (Medications Ordered): No orders of the defined types were placed in this encounter.  Orders:  No orders of the defined types were placed in this  encounter.  Follow-up plan:   No follow-ups on file.     Interventional Therapies  Risk  Complexity Considerations:   Estimated body mass index is 41.37 kg/m as calculated from the following:   Height as of this encounter: '5\' 4"'  (1.626 m).   Weight as of this encounter: 241 lb (109.3 kg). NOTE: Eliquis Anticoagulation: (Stop: 3 days  Restart: 6 hrs)   Planned  Pending:   Therapeutic right L3 and L4 TFESI + right IA hip injection    Under consideration:   Diagnostic bilateral iliopsoas bursa injection #1  Diagnostic midline T12-L1 LESI    Completed:   Diagnostic therapeutic right L4-5 LESI x1 (05/22/2021) (100/100/100/90)  Diagnostic bilateral IA hip joint injection x1 (11/30/2020) (100/100/80/80)  Therapeutic right IA hip joint injection x1 (06/12/2021) (100/100/75/0)  Diagnostic right gluteofemoral bursa inj. x1 (06/12/2021) (100/100/75/0)  Diagnostic bilateral lumbar facet MBB x3 (01/04/2021) (100/50/0/70)  Diagnostic bilateral L3 TFESI x3 (11/07/2020) (100/100/100/100% relief of the bilateral L1/L2 radiculitis)  Diagnostic right L2-3 LESI x1 (07/11/2020) (100/100/100/100% relief of the bilateral L1/L2 radiculitis)    Completed by Sharlet Salina, DO:   02/23/2020: RF neurotomy to the bilateral L3-4 and L4-5 facet joints 12/16/2019: Bilateral L4-5 transforaminal ESI (ODI: 64%, one day of relief then return of pain) 11/19/2019: Bilateral L5-S1 transforaminal ESI (ODI 64%, no relief) 10/06/2019: MBB to the bilateral L3-4 and L4-5 facet joints (8/10 to 1/10, continued relief of back pain new pain radiating to the bilateral posterior thighs and occasional anterior thighs) 08/05/2019: MBB to the bilateral L3-4 and L4-5 facet joints (10/10 to 3/10) 06/01/2019: Bilateral L3-4 and L4-5 facet joint injections 06/09/2018: Bilateral L5-S1 transforaminal ESI (mild relief x3 days) 05/11/2018: Bilateral L5-S1 transforaminal ESI (30% relief x7 to 10 days)   Therapeutic  Palliative (PRN)  options:   Therapeutic right L2-3 LESI #2  Therapeutic bilateral L3 TFESI #4  Therapeutic right L4 LESI #2  Diagnostic bilateral lumbar facet MBB #3      Recent Visits No visits were found meeting these conditions. Showing recent visits within past 90 days and meeting all other requirements Future Appointments Date Type Provider Dept  11/05/21 Appointment Milinda Pointer, MD [facility]  Showing future appointments within next 90 days and meeting all other requirements  I discussed the assessment and treatment plan with the patient. The patient was provided an opportunity to ask questions and all were answered. The patient agreed with the plan and demonstrated an understanding of the instructions.  Patient advised to call back or seek an in-person evaluation if the symptoms or condition worsens.  Duration of encounter: *** minutes.  Total time on encounter, as per AMA guidelines included both the face-to-face and non-face-to-face time personally spent by the physician and/or other qualified health care professional(s) on the day of the encounter (includes time in activities that require the physician or other qualified health care professional and does not include time in activities normally performed by clinical staff). Physician's time may include the following activities when performed: preparing to see the patient (eg, review of tests, pre-charting review of records) obtaining and/or reviewing separately obtained history performing a medically appropriate examination and/or evaluation counseling and educating the patient/family/caregiver ordering medications, tests, or procedures referring and communicating with other health care professionals (when not separately reported) documenting clinical  information in the electronic or other health record independently interpreting results (not separately reported) and communicating results to the patient/ family/caregiver care  coordination (not separately reported)  Note by: Gaspar Cola, MD Date: 11/05/2021; Time: 3:26 PM

## 2021-11-05 ENCOUNTER — Encounter: Payer: Self-pay | Admitting: Pain Medicine

## 2021-11-05 ENCOUNTER — Ambulatory Visit: Payer: Medicare HMO | Attending: Pain Medicine | Admitting: Pain Medicine

## 2021-11-05 VITALS — BP 107/64 | HR 71 | Temp 97.4°F | Resp 18 | Ht 64.0 in | Wt 240.0 lb

## 2021-11-05 DIAGNOSIS — Z79899 Other long term (current) drug therapy: Secondary | ICD-10-CM | POA: Diagnosis not present

## 2021-11-05 DIAGNOSIS — Z79891 Long term (current) use of opiate analgesic: Secondary | ICD-10-CM | POA: Insufficient documentation

## 2021-11-05 DIAGNOSIS — R1032 Left lower quadrant pain: Secondary | ICD-10-CM | POA: Insufficient documentation

## 2021-11-05 DIAGNOSIS — M79605 Pain in left leg: Secondary | ICD-10-CM | POA: Insufficient documentation

## 2021-11-05 DIAGNOSIS — M25552 Pain in left hip: Secondary | ICD-10-CM | POA: Insufficient documentation

## 2021-11-05 DIAGNOSIS — R1031 Right lower quadrant pain: Secondary | ICD-10-CM | POA: Insufficient documentation

## 2021-11-05 DIAGNOSIS — G8929 Other chronic pain: Secondary | ICD-10-CM | POA: Diagnosis not present

## 2021-11-05 DIAGNOSIS — M79604 Pain in right leg: Secondary | ICD-10-CM | POA: Diagnosis not present

## 2021-11-05 DIAGNOSIS — M47816 Spondylosis without myelopathy or radiculopathy, lumbar region: Secondary | ICD-10-CM | POA: Diagnosis not present

## 2021-11-05 DIAGNOSIS — G894 Chronic pain syndrome: Secondary | ICD-10-CM | POA: Insufficient documentation

## 2021-11-05 DIAGNOSIS — M25551 Pain in right hip: Secondary | ICD-10-CM | POA: Insufficient documentation

## 2021-11-05 DIAGNOSIS — M545 Low back pain, unspecified: Secondary | ICD-10-CM | POA: Diagnosis not present

## 2021-11-05 MED ORDER — OXYCODONE HCL 5 MG PO TABS: 5.0000 mg | ORAL_TABLET | Freq: Three times a day (TID) | ORAL | 0 refills | Status: AC | PRN

## 2021-11-05 NOTE — Patient Instructions (Signed)

## 2021-11-05 NOTE — Progress Notes (Signed)
Nursing Pain Medication Assessment:  Safety precautions to be maintained throughout the outpatient stay will include: orient to surroundings, keep bed in low position, maintain call bell within reach at all times, provide assistance with transfer out of bed and ambulation.  Medication Inspection Compliance: Jamie Phillips did not comply with our request to bring her pills to be counted. She was reminded that bringing the medication bottles, even when empty, is a requirement.  Medication: None brought in. Pill/Patch Count: None available to be counted. Bottle Appearance: No container available. Did not bring bottle(s) to appointment. Filled Date: N/A Last Medication intake:  Ran out of medicine more than 48 hours ago

## 2021-11-07 DIAGNOSIS — R531 Weakness: Secondary | ICD-10-CM | POA: Diagnosis not present

## 2021-11-07 DIAGNOSIS — M48061 Spinal stenosis, lumbar region without neurogenic claudication: Secondary | ICD-10-CM | POA: Diagnosis not present

## 2021-11-09 LAB — TOXASSURE SELECT 13 (MW), URINE

## 2021-11-13 DIAGNOSIS — R531 Weakness: Secondary | ICD-10-CM | POA: Diagnosis not present

## 2021-11-13 DIAGNOSIS — M48061 Spinal stenosis, lumbar region without neurogenic claudication: Secondary | ICD-10-CM | POA: Diagnosis not present

## 2021-11-15 DIAGNOSIS — R531 Weakness: Secondary | ICD-10-CM | POA: Diagnosis not present

## 2021-11-15 DIAGNOSIS — M48061 Spinal stenosis, lumbar region without neurogenic claudication: Secondary | ICD-10-CM | POA: Diagnosis not present

## 2021-11-16 ENCOUNTER — Encounter: Payer: Medicare HMO | Attending: Physical Medicine and Rehabilitation | Admitting: Physical Medicine and Rehabilitation

## 2021-11-16 ENCOUNTER — Encounter: Payer: Self-pay | Admitting: Physical Medicine and Rehabilitation

## 2021-11-16 VITALS — BP 108/71 | HR 58 | Ht 64.0 in | Wt 240.4 lb

## 2021-11-16 DIAGNOSIS — Z5181 Encounter for therapeutic drug level monitoring: Secondary | ICD-10-CM | POA: Diagnosis not present

## 2021-11-16 DIAGNOSIS — M5442 Lumbago with sciatica, left side: Secondary | ICD-10-CM | POA: Insufficient documentation

## 2021-11-16 DIAGNOSIS — G8929 Other chronic pain: Secondary | ICD-10-CM | POA: Diagnosis not present

## 2021-11-16 DIAGNOSIS — R269 Unspecified abnormalities of gait and mobility: Secondary | ICD-10-CM | POA: Diagnosis not present

## 2021-11-16 DIAGNOSIS — Z79891 Long term (current) use of opiate analgesic: Secondary | ICD-10-CM | POA: Diagnosis not present

## 2021-11-16 DIAGNOSIS — M5417 Radiculopathy, lumbosacral region: Secondary | ICD-10-CM | POA: Insufficient documentation

## 2021-11-16 DIAGNOSIS — M5441 Lumbago with sciatica, right side: Secondary | ICD-10-CM | POA: Insufficient documentation

## 2021-11-16 DIAGNOSIS — G894 Chronic pain syndrome: Secondary | ICD-10-CM | POA: Diagnosis not present

## 2021-11-16 DIAGNOSIS — M48062 Spinal stenosis, lumbar region with neurogenic claudication: Secondary | ICD-10-CM | POA: Insufficient documentation

## 2021-11-16 MED ORDER — GABAPENTIN 400 MG PO CAPS: 400.0000 mg | ORAL_CAPSULE | Freq: Three times a day (TID) | ORAL | 1 refills | Status: DC

## 2021-11-16 MED ORDER — HYDROCODONE-ACETAMINOPHEN 5-325 MG PO TABS: 1.0000 | ORAL_TABLET | Freq: Four times a day (QID) | ORAL | 0 refills | Status: DC | PRN

## 2021-11-16 MED ORDER — LIDOCAINE 5 % EX PTCH
1.0000 | MEDICATED_PATCH | CUTANEOUS | 1 refills | Status: DC
Start: 2021-11-16 — End: 2022-09-23

## 2021-11-16 NOTE — Addendum Note (Signed)
Addended by: Jasmine December T on: 11/16/2021 12:11 PM   Modules accepted: Orders

## 2021-11-16 NOTE — Progress Notes (Signed)
Subjective:    Patient ID: Jamie Phillips, female    DOB: 20-Jan-1941, 81 y.o.   MRN: [id number]  HPI  Pt is a 81 yr old female with BMI of 41, CLL; hx of Bell's palsy; central lumbar stenosis and radiculopathy- hx of Lumbar lami L2-L5; HTN- but not lately. No DM;   Here for evaluation of back pain-    Right next to incision site on R side, very painful  Stabbing pain-  Feels like nerve pain- not muscle spasms per pt.  Heat makes it better temporarily- but ice makes her hurt more.   Dr Annette Stable told her it would take ~ 6 months to heal.   Doesn't go down into legs anymore.    Was sent to pain mgmt doctor in [place]- told her she had to lose 75 lbs before they would really work with her.   Hydrocodone is more effective than oxycodone.  Oxycodone not really much more effective than tylenol.  Norco had when had surgery was much more effective.   Doesn't take pain meds unless hurts "really bad"- and scared of addiction. Says can act stupid, but wants to remember it!  Tried: Gabapentin- makes her sleepy- but 400 mg in Am and 400 mg QHS- ordered TID Also on Duloxetine- 60 mg daily On Magnesium 500 mg 2x/day and Vit D/Calcium  Tried lidoderm patch in past- not lately- was helpful    Pain Inventory Average Pain 8 Pain Right Now 9 My pain is sharp, stabbing, and aching  In the last 24 hours, has pain interfered with the following? General activity 9 Relation with others 9 Enjoyment of life 9 What TIME of day is your pain at its worst? morning , daytime, evening, and night Sleep (in general) Poor  Pain is worse with: walking, bending, sitting, inactivity, standing, unsure, and some activites Pain improves with: rest, heat/ice, therapy/exercise, and injections Relief from Meds: 0  use a walker how many minutes can you walk? 5 ability to climb steps?  no do you drive?  yes  retired  bowel control problems weakness trouble walking  New pt  New pt    Family  History  Problem Relation Age of Onset   Pulmonary embolism Mother    Sudden death Mother    Obesity Mother    Heart failure Father    Congestive Heart Failure Father    Sudden death Father    Thyroid cancer Brother    Breast cancer Neg Hx    Social History   Socioeconomic History   Marital status: Widowed    Spouse name: Not on file   Number of children: Not on file   Years of education: Not on file   Highest education level: Not on file  Occupational History   Occupation: Retired  Tobacco Use   Smoking status: Never   Smokeless tobacco: Never  Vaping Use   Vaping Use: Never used  Substance and Sexual Activity   Alcohol use: No    Alcohol/week: 0.0 standard drinks of alcohol   Drug use: No   Sexual activity: Not on file  Other Topics Concern   Not on file  Social History Narrative   ** Merged History Encounter **       Social Determinants of Health   Financial Resource Strain: Not on file  Food Insecurity: Not on file  Transportation Needs: Not on file  Physical Activity: Not on file  Stress: Not on file  Social Connections: Not on file  Past Surgical History:  Procedure Laterality Date   APPENDECTOMY     BREAST BIOPSY Left 08/19/2004   lt bx/clip-neg   COLONOSCOPY     COLONOSCOPY WITH PROPOFOL N/A 03/10/2017   Procedure: COLONOSCOPY WITH PROPOFOL;  Surgeon: [name], MD;  Location: [facility] ENDOSCOPY;  Service: Endoscopy;  Laterality: N/A;   ESOPHAGOGASTRODUODENOSCOPY (EGD) WITH PROPOFOL N/A 02/02/2019   Procedure: ESOPHAGOGASTRODUODENOSCOPY (EGD) WITH PROPOFOL;  Surgeon: Toledo, Benay Pike, MD;  Location: [facility] ENDOSCOPY;  Service: Endoscopy;  Laterality: N/A;   IVC FILTER INSERTION N/A 06/11/2016   Procedure: IVC Filter Insertion;  Surgeon: Katha Cabal, MD;  Location: [place] CV LAB;  Service: Cardiovascular;  Laterality: N/A;   IVC FILTER REMOVAL N/A 05/06/2017   Procedure: IVC FILTER REMOVAL;  Surgeon: Katha Cabal, MD;   Location: [place] CV LAB;  Service: Cardiovascular;  Laterality: N/A;   JOINT REPLACEMENT Left 06/17/2016   Knees, pt stated left and right have been replaced.   LAPAROSCOPIC APPENDECTOMY N/A 10/10/2018   Procedure: APPENDECTOMY LAPAROSCOPIC ATTEMPTED CONVERTED TO OPEN;  Surgeon: Herbert Pun, MD;  Location: [facility] ORS;  Service: General;  Laterality: N/A;   LAPAROSCOPY N/A 10/10/2018   Procedure: LAPAROSCOPY DIAGNOSTIC ATTEMPTED CONVERTED TO OPEN;  Surgeon: Herbert Pun, MD;  Location: [facility] ORS;  Service: General;  Laterality: N/A;   LUMBAR LAMINECTOMY/DECOMPRESSION MICRODISCECTOMY N/A 07/09/2021   Procedure: Laminectomy and Foraminotomy - Lumbar Two-Lumbar Three - Lumbar Three-Lumbar Four - Lumbar Four-Lumbar Five;  Surgeon: Earnie Larsson, MD;  Location: [place];  Service: Neurosurgery;  Laterality: N/A;   neck fusion     PARTIAL COLECTOMY Right 10/10/2018   Procedure: PARTIAL COLECTOMY;  Surgeon: Herbert Pun, MD;  Location: [facility] ORS;  Service: General;  Laterality: Right;   PERIPHERAL VASCULAR CATHETERIZATION N/A 08/15/2015   Procedure: pulmonary angiogram with lysis;  Surgeon: Katha Cabal, MD;  Location: [place] CV LAB;  Service: Cardiovascular;  Laterality: N/A;   WRIST SURGERY Left 2000   Past Medical History:  Diagnosis Date   Anginal pain ([place])    atypical chest pain   Anxiety    Arthritis    Back pain    Cancer (HCC)    cll   Chronic kidney disease    Chronic kidney disease    CLL (chronic lymphocytic leukemia) (HCC)    Depression    Epigastric pain    GERD (gastroesophageal reflux disease)    Headache    Hemorrhoids    Hypercholesteremia    Hyperlipidemia    Hypertension    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)    Macular degeneration    MGUS (monoclonal gammopathy of unknown significance)    Morbid obesity (HCC)    Obesity    Pulmonary embolism (HCC)    PVD (peripheral vascular disease) (HCC)    Sleep apnea     BP 108/71   Pulse (!) 58   Ht '5\' 4"'$  (1.626 m)   Wt 240 lb 6.4 oz (109 kg)   SpO2 91%   BMI 41.26 kg/m   Opioid Risk Score:   Fall Risk Score:  `1  Depression screen [facility] 2/9     11/16/2021   11:18 AM 11/05/2021   10:42 AM 06/26/2021    2:11 PM 05/22/2021    8:41 AM 05/14/2021   11:48 AM 02/07/2021   10:23 AM 12/26/2020    8:26 AM  Depression screen PHQ 2/9  Decreased Interest 2 0 0 0 0 0 0  Down, Depressed, Hopeless 0 0  0 0 0 0 0  PHQ - 2 Score 2 0 0 0 0 0 0  Altered sleeping 1      0  Tired, decreased energy 1      1  Change in appetite 0      0  Feeling bad or failure about yourself  0      0  Trouble concentrating 0      0  Moving slowly or fidgety/restless 0      0  Suicidal thoughts 0      0  PHQ-9 Score 4      1  Difficult doing work/chores Very difficult      Not difficult at all     Review of Systems  Musculoskeletal:  Positive for back pain and gait problem.  Neurological:  Positive for weakness.  All other systems reviewed and are negative.     Objective:   Physical Exam  Awake, alert, appropriate, using rolator to walk; sitting more straight upright, NAD  MS; 5/5 in HF/KE/KF DF and PF B/L  (+) SLR on R side TTP just lateral to scar/incision on R lumbar spine- has associate puffiness  Neuro: Decreased sensation L great toe and 3rd-5th toes on R foot.       Assessment & Plan:   Pt is a 81 yr old female with BMI of 41, CLL; hx of Bell's palsy; central lumbar stenosis and radiculopathy- hx of Lumbar lami L2-L5; HTN- but not lately. 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   Here for evaluation of spinal stenosis- back pain-    Will change Oxycodone to Hydrocodone- 5/325 up to 4 tabs/day- #120- no refills.   2. Change gabapentin- to 400 mg 1 pill in AM; and 800 mg/2 pills at night- #270- 3 months supply- 1 refill. Has some at home- doesn't need right now.    3. Con't  Cymbalta/Duloxetine- 60 mg daily- can refill in future if needed  4. Will try starting lidocaine patches 1 patch 12 hrs on; 12 hrs off- has had pain relief with it in past- already on 2 nerve pain meds- and not controlling pain- so think could be real helpful for her nerve pain related to spinal stenosis.   5. Opiate contract; Urine drug screen   6. F/U in 3 months and can call to let me know how things going.    I spent a total of 38   minutes on total care today- >50% coordination of care- due to discussion about pain and options and educated on nerve pain

## 2021-11-16 NOTE — Patient Instructions (Signed)
Pt is a 81 yr old female with BMI of 41, CLL; hx of Bell's palsy; central lumbar stenosis and radiculopathy- hx of Lumbar lami L2-L5; HTN- but not lately. 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   Here for evaluation of spinal stenosis- back pain-    Will change Oxycodone to Hydrocodone- 5/325 up to 4 tabs/day- #120- no refills.   2. Change gabapentin- to 400 mg 1 pill in AM; and 800 mg/2 pills at night- #270- 3 months supply- 1 refill. Has some at home- doesn't need right now.    3. Con't Cymbalta/Duloxetine- 60 mg daily- can refill in future if needed  4. Will try starting lidocaine patches 1 patch 12 hrs on; 12 hrs off- has had pain relief with it in past- already on 2 nerve pain meds- and not controlling pain- so think could be real helpful for her nerve pain related to spinal stenosis.   5. Opiate contract; Urine drug screen   6. F/U in 24month and can call to let me know how things going.

## 2021-11-20 ENCOUNTER — Telehealth: Payer: Self-pay

## 2021-11-20 ENCOUNTER — Telehealth: Payer: Self-pay | Admitting: Physical Medicine and Rehabilitation

## 2021-11-20 DIAGNOSIS — R531 Weakness: Secondary | ICD-10-CM | POA: Diagnosis not present

## 2021-11-20 DIAGNOSIS — M48061 Spinal stenosis, lumbar region without neurogenic claudication: Secondary | ICD-10-CM | POA: Diagnosis not present

## 2021-11-20 NOTE — Telephone Encounter (Signed)
Rx has been approved. Insurance informed.

## 2021-11-20 NOTE — Telephone Encounter (Signed)
PA for Lidocaine 5% Patches submitted 11/21/2019.

## 2021-11-20 NOTE — Telephone Encounter (Signed)
Called patient and she said the pharmacy needs a clarification on Gabapentin. Called [facility] and the directions was not clear. I told [facility] according to the note patient is take Gabapentin 400 mg one in the am and 2 in the pm.

## 2021-11-20 NOTE — Telephone Encounter (Signed)
Patient needs a call back about her medication and the pharmacy having an issue with it.  Patient's number is [phone].

## 2021-11-21 NOTE — Telephone Encounter (Signed)
Patient informed. 

## 2021-11-22 DIAGNOSIS — R531 Weakness: Secondary | ICD-10-CM | POA: Diagnosis not present

## 2021-11-22 DIAGNOSIS — M48061 Spinal stenosis, lumbar region without neurogenic claudication: Secondary | ICD-10-CM | POA: Diagnosis not present

## 2021-11-22 LAB — TOXASSURE SELECT,+ANTIDEPR,UR

## 2021-11-26 ENCOUNTER — Telehealth: Payer: Self-pay | Admitting: *Deleted

## 2021-11-26 DIAGNOSIS — H353221 Exudative age-related macular degeneration, left eye, with active choroidal neovascularization: Secondary | ICD-10-CM | POA: Diagnosis not present

## 2021-11-26 NOTE — Telephone Encounter (Signed)
Urine drug screen for this encounter is consistent for prescribed medication 

## 2021-11-27 DIAGNOSIS — R531 Weakness: Secondary | ICD-10-CM | POA: Diagnosis not present

## 2021-11-27 DIAGNOSIS — M48061 Spinal stenosis, lumbar region without neurogenic claudication: Secondary | ICD-10-CM | POA: Diagnosis not present

## 2021-11-29 DIAGNOSIS — M48061 Spinal stenosis, lumbar region without neurogenic claudication: Secondary | ICD-10-CM | POA: Diagnosis not present

## 2021-11-29 DIAGNOSIS — R531 Weakness: Secondary | ICD-10-CM | POA: Diagnosis not present

## 2021-12-03 DIAGNOSIS — R531 Weakness: Secondary | ICD-10-CM | POA: Diagnosis not present

## 2021-12-03 DIAGNOSIS — M48061 Spinal stenosis, lumbar region without neurogenic claudication: Secondary | ICD-10-CM | POA: Diagnosis not present

## 2021-12-04 DIAGNOSIS — Z6841 Body Mass Index (BMI) 40.0 and over, adult: Secondary | ICD-10-CM | POA: Diagnosis not present

## 2021-12-04 DIAGNOSIS — M48062 Spinal stenosis, lumbar region with neurogenic claudication: Secondary | ICD-10-CM | POA: Diagnosis not present

## 2021-12-11 DIAGNOSIS — M5126 Other intervertebral disc displacement, lumbar region: Secondary | ICD-10-CM | POA: Diagnosis not present

## 2021-12-11 DIAGNOSIS — M48062 Spinal stenosis, lumbar region with neurogenic claudication: Secondary | ICD-10-CM | POA: Diagnosis not present

## 2021-12-11 DIAGNOSIS — M48061 Spinal stenosis, lumbar region without neurogenic claudication: Secondary | ICD-10-CM | POA: Diagnosis not present

## 2021-12-12 DIAGNOSIS — M48062 Spinal stenosis, lumbar region with neurogenic claudication: Secondary | ICD-10-CM | POA: Diagnosis not present

## 2021-12-17 DIAGNOSIS — M2560 Stiffness of unspecified joint, not elsewhere classified: Secondary | ICD-10-CM | POA: Diagnosis not present

## 2021-12-17 DIAGNOSIS — M5459 Other low back pain: Secondary | ICD-10-CM | POA: Diagnosis not present

## 2021-12-17 DIAGNOSIS — R2689 Other abnormalities of gait and mobility: Secondary | ICD-10-CM | POA: Diagnosis not present

## 2021-12-20 DIAGNOSIS — M5459 Other low back pain: Secondary | ICD-10-CM | POA: Diagnosis not present

## 2021-12-20 DIAGNOSIS — R2689 Other abnormalities of gait and mobility: Secondary | ICD-10-CM | POA: Diagnosis not present

## 2021-12-20 DIAGNOSIS — M2560 Stiffness of unspecified joint, not elsewhere classified: Secondary | ICD-10-CM | POA: Diagnosis not present

## 2021-12-25 DIAGNOSIS — R2689 Other abnormalities of gait and mobility: Secondary | ICD-10-CM | POA: Diagnosis not present

## 2021-12-25 DIAGNOSIS — M2560 Stiffness of unspecified joint, not elsewhere classified: Secondary | ICD-10-CM | POA: Diagnosis not present

## 2021-12-25 DIAGNOSIS — M5459 Other low back pain: Secondary | ICD-10-CM | POA: Diagnosis not present

## 2022-01-03 DIAGNOSIS — M48062 Spinal stenosis, lumbar region with neurogenic claudication: Secondary | ICD-10-CM | POA: Diagnosis not present

## 2022-01-03 DIAGNOSIS — M6281 Muscle weakness (generalized): Secondary | ICD-10-CM | POA: Diagnosis not present

## 2022-01-03 DIAGNOSIS — M545 Low back pain, unspecified: Secondary | ICD-10-CM | POA: Diagnosis not present

## 2022-01-09 DIAGNOSIS — M6281 Muscle weakness (generalized): Secondary | ICD-10-CM | POA: Diagnosis not present

## 2022-01-09 DIAGNOSIS — M48062 Spinal stenosis, lumbar region with neurogenic claudication: Secondary | ICD-10-CM | POA: Diagnosis not present

## 2022-01-09 DIAGNOSIS — M545 Low back pain, unspecified: Secondary | ICD-10-CM | POA: Diagnosis not present

## 2022-01-11 DIAGNOSIS — M545 Low back pain, unspecified: Secondary | ICD-10-CM | POA: Diagnosis not present

## 2022-01-11 DIAGNOSIS — M48062 Spinal stenosis, lumbar region with neurogenic claudication: Secondary | ICD-10-CM | POA: Diagnosis not present

## 2022-01-11 DIAGNOSIS — M6281 Muscle weakness (generalized): Secondary | ICD-10-CM | POA: Diagnosis not present

## 2022-01-14 DIAGNOSIS — M48062 Spinal stenosis, lumbar region with neurogenic claudication: Secondary | ICD-10-CM | POA: Diagnosis not present

## 2022-01-14 DIAGNOSIS — M6281 Muscle weakness (generalized): Secondary | ICD-10-CM | POA: Diagnosis not present

## 2022-01-14 DIAGNOSIS — M545 Low back pain, unspecified: Secondary | ICD-10-CM | POA: Diagnosis not present

## 2022-01-16 DIAGNOSIS — M48062 Spinal stenosis, lumbar region with neurogenic claudication: Secondary | ICD-10-CM | POA: Diagnosis not present

## 2022-01-16 DIAGNOSIS — M545 Low back pain, unspecified: Secondary | ICD-10-CM | POA: Diagnosis not present

## 2022-01-16 DIAGNOSIS — M6281 Muscle weakness (generalized): Secondary | ICD-10-CM | POA: Diagnosis not present

## 2022-01-22 ENCOUNTER — Other Ambulatory Visit: Payer: Self-pay | Admitting: Internal Medicine

## 2022-01-22 DIAGNOSIS — Z1231 Encounter for screening mammogram for malignant neoplasm of breast: Secondary | ICD-10-CM

## 2022-01-22 DIAGNOSIS — M48062 Spinal stenosis, lumbar region with neurogenic claudication: Secondary | ICD-10-CM | POA: Diagnosis not present

## 2022-01-24 DIAGNOSIS — M545 Low back pain, unspecified: Secondary | ICD-10-CM | POA: Diagnosis not present

## 2022-01-24 DIAGNOSIS — M6281 Muscle weakness (generalized): Secondary | ICD-10-CM | POA: Diagnosis not present

## 2022-01-24 DIAGNOSIS — M48062 Spinal stenosis, lumbar region with neurogenic claudication: Secondary | ICD-10-CM | POA: Diagnosis not present

## 2022-01-28 ENCOUNTER — Encounter: Payer: Medicare HMO | Admitting: Pain Medicine

## 2022-01-30 DIAGNOSIS — M545 Low back pain, unspecified: Secondary | ICD-10-CM | POA: Diagnosis not present

## 2022-01-30 DIAGNOSIS — M48062 Spinal stenosis, lumbar region with neurogenic claudication: Secondary | ICD-10-CM | POA: Diagnosis not present

## 2022-01-30 DIAGNOSIS — M6281 Muscle weakness (generalized): Secondary | ICD-10-CM | POA: Diagnosis not present

## 2022-02-04 DIAGNOSIS — M6281 Muscle weakness (generalized): Secondary | ICD-10-CM | POA: Diagnosis not present

## 2022-02-04 DIAGNOSIS — M545 Low back pain, unspecified: Secondary | ICD-10-CM | POA: Diagnosis not present

## 2022-02-04 DIAGNOSIS — M48062 Spinal stenosis, lumbar region with neurogenic claudication: Secondary | ICD-10-CM | POA: Diagnosis not present

## 2022-02-05 ENCOUNTER — Ambulatory Visit
Admission: RE | Admit: 2022-02-05 | Discharge: 2022-02-05 | Disposition: A | Discharge status: Home / Self Care | Payer: Medicare HMO | Source: Ambulatory Visit | Attending: Internal Medicine | Admitting: Internal Medicine

## 2022-02-05 DIAGNOSIS — Z1231 Encounter for screening mammogram for malignant neoplasm of breast: Secondary | ICD-10-CM | POA: Insufficient documentation

## 2022-02-07 DIAGNOSIS — M6281 Muscle weakness (generalized): Secondary | ICD-10-CM | POA: Diagnosis not present

## 2022-02-07 DIAGNOSIS — M545 Low back pain, unspecified: Secondary | ICD-10-CM | POA: Diagnosis not present

## 2022-02-07 DIAGNOSIS — M48062 Spinal stenosis, lumbar region with neurogenic claudication: Secondary | ICD-10-CM | POA: Diagnosis not present

## 2022-02-12 DIAGNOSIS — M48062 Spinal stenosis, lumbar region with neurogenic claudication: Secondary | ICD-10-CM | POA: Diagnosis not present

## 2022-02-12 DIAGNOSIS — M545 Low back pain, unspecified: Secondary | ICD-10-CM | POA: Diagnosis not present

## 2022-02-12 DIAGNOSIS — M6281 Muscle weakness (generalized): Secondary | ICD-10-CM | POA: Diagnosis not present

## 2022-02-14 DIAGNOSIS — M6281 Muscle weakness (generalized): Secondary | ICD-10-CM | POA: Diagnosis not present

## 2022-02-14 DIAGNOSIS — M545 Low back pain, unspecified: Secondary | ICD-10-CM | POA: Diagnosis not present

## 2022-02-14 DIAGNOSIS — M48062 Spinal stenosis, lumbar region with neurogenic claudication: Secondary | ICD-10-CM | POA: Diagnosis not present

## 2022-02-19 DIAGNOSIS — M6281 Muscle weakness (generalized): Secondary | ICD-10-CM | POA: Diagnosis not present

## 2022-02-19 DIAGNOSIS — M545 Low back pain, unspecified: Secondary | ICD-10-CM | POA: Diagnosis not present

## 2022-02-19 DIAGNOSIS — M48062 Spinal stenosis, lumbar region with neurogenic claudication: Secondary | ICD-10-CM | POA: Diagnosis not present

## 2022-02-21 DIAGNOSIS — M545 Low back pain, unspecified: Secondary | ICD-10-CM | POA: Diagnosis not present

## 2022-02-21 DIAGNOSIS — M48062 Spinal stenosis, lumbar region with neurogenic claudication: Secondary | ICD-10-CM | POA: Diagnosis not present

## 2022-02-21 DIAGNOSIS — M6281 Muscle weakness (generalized): Secondary | ICD-10-CM | POA: Diagnosis not present

## 2022-02-26 DIAGNOSIS — M545 Low back pain, unspecified: Secondary | ICD-10-CM | POA: Diagnosis not present

## 2022-02-26 DIAGNOSIS — M48062 Spinal stenosis, lumbar region with neurogenic claudication: Secondary | ICD-10-CM | POA: Diagnosis not present

## 2022-02-26 DIAGNOSIS — M6281 Muscle weakness (generalized): Secondary | ICD-10-CM | POA: Diagnosis not present

## 2022-03-01 DIAGNOSIS — M6281 Muscle weakness (generalized): Secondary | ICD-10-CM | POA: Diagnosis not present

## 2022-03-01 DIAGNOSIS — M48062 Spinal stenosis, lumbar region with neurogenic claudication: Secondary | ICD-10-CM | POA: Diagnosis not present

## 2022-03-01 DIAGNOSIS — M545 Low back pain, unspecified: Secondary | ICD-10-CM | POA: Diagnosis not present

## 2022-03-05 DIAGNOSIS — M545 Low back pain, unspecified: Secondary | ICD-10-CM | POA: Diagnosis not present

## 2022-03-05 DIAGNOSIS — M48062 Spinal stenosis, lumbar region with neurogenic claudication: Secondary | ICD-10-CM | POA: Diagnosis not present

## 2022-03-05 DIAGNOSIS — M6281 Muscle weakness (generalized): Secondary | ICD-10-CM | POA: Diagnosis not present

## 2022-03-06 ENCOUNTER — Ambulatory Visit: Payer: Medicare HMO | Admitting: Physical Medicine and Rehabilitation

## 2022-03-06 DIAGNOSIS — C9110 Chronic lymphocytic leukemia of B-cell type not having achieved remission: Secondary | ICD-10-CM | POA: Diagnosis not present

## 2022-03-06 DIAGNOSIS — G4733 Obstructive sleep apnea (adult) (pediatric): Secondary | ICD-10-CM | POA: Diagnosis not present

## 2022-03-06 DIAGNOSIS — E7800 Pure hypercholesterolemia, unspecified: Secondary | ICD-10-CM | POA: Diagnosis not present

## 2022-03-06 DIAGNOSIS — R7303 Prediabetes: Secondary | ICD-10-CM | POA: Diagnosis not present

## 2022-03-06 DIAGNOSIS — Z6839 Body mass index (BMI) 39.0-39.9, adult: Secondary | ICD-10-CM | POA: Diagnosis not present

## 2022-03-06 DIAGNOSIS — N1831 Chronic kidney disease, stage 3a: Secondary | ICD-10-CM | POA: Diagnosis not present

## 2022-03-06 DIAGNOSIS — I129 Hypertensive chronic kidney disease with stage 1 through stage 4 chronic kidney disease, or unspecified chronic kidney disease: Secondary | ICD-10-CM | POA: Diagnosis not present

## 2022-03-06 DIAGNOSIS — F325 Major depressive disorder, single episode, in full remission: Secondary | ICD-10-CM | POA: Diagnosis not present

## 2022-03-10 DIAGNOSIS — M48062 Spinal stenosis, lumbar region with neurogenic claudication: Secondary | ICD-10-CM | POA: Diagnosis not present

## 2022-03-10 DIAGNOSIS — M6281 Muscle weakness (generalized): Secondary | ICD-10-CM | POA: Diagnosis not present

## 2022-03-10 DIAGNOSIS — M545 Low back pain, unspecified: Secondary | ICD-10-CM | POA: Diagnosis not present

## 2022-03-11 ENCOUNTER — Encounter: Payer: Self-pay | Admitting: Physical Medicine and Rehabilitation

## 2022-03-11 ENCOUNTER — Encounter: Payer: Medicare HMO | Attending: Physical Medicine and Rehabilitation | Admitting: Physical Medicine and Rehabilitation

## 2022-03-11 VITALS — BP 101/69 | HR 98 | Ht 64.0 in | Wt 242.0 lb

## 2022-03-11 DIAGNOSIS — R269 Unspecified abnormalities of gait and mobility: Secondary | ICD-10-CM | POA: Diagnosis not present

## 2022-03-11 DIAGNOSIS — M62838 Other muscle spasm: Secondary | ICD-10-CM | POA: Insufficient documentation

## 2022-03-11 DIAGNOSIS — G894 Chronic pain syndrome: Secondary | ICD-10-CM | POA: Insufficient documentation

## 2022-03-11 DIAGNOSIS — M5417 Radiculopathy, lumbosacral region: Secondary | ICD-10-CM | POA: Diagnosis not present

## 2022-03-11 MED ORDER — DULOXETINE HCL 60 MG PO CPEP: 60.0000 mg | ORAL_CAPSULE | Freq: Every day | ORAL | 1 refills | Status: DC

## 2022-03-11 MED ORDER — METHOCARBAMOL 500 MG PO TABS: 500.0000 mg | ORAL_TABLET | Freq: Three times a day (TID) | ORAL | 1 refills | Status: DC | PRN

## 2022-03-11 MED ORDER — GABAPENTIN 400 MG PO CAPS: 400.0000 mg | ORAL_CAPSULE | Freq: Three times a day (TID) | ORAL | 1 refills | Status: DC

## 2022-03-11 MED ORDER — HYDROCODONE-ACETAMINOPHEN 5-325 MG PO TABS: 1.0000 | ORAL_TABLET | Freq: Four times a day (QID) | ORAL | 0 refills | Status: DC | PRN

## 2022-03-11 NOTE — Progress Notes (Signed)
Subjective:    Patient ID: Jamie Phillips, female    DOB: 08/12/1940, 81 y.o.   MRN: [id number]  HPI    Pt is a 81 yr old female with BMI of 41, CLL; hx of Bell's palsy; central lumbar stenosis and radiculopathy- hx of Lumbar lami L2-L5; HTN- but not lately. 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  Here for f/u on chronic pain.    Doesn't think Oxy or Norco  Takes with tylenol Arthritis, helps a little- when takes by self, doesn't help much. But admits, waits until hurts "really bad".    Gabapentin- 1 tab in AM and 2 at night- makes sleepy- so thinks it works better.   Big toe on L foot and last 3 toes on R foot.  Tried voltaren gel-  Has tried horse linament- helps.   Lidocaine patches- works some- getting OTC- cheaper than to get Rx.  Has to take Eliquis. Found 2 bottles- since in donut hole and has to pay $400.   Getting dry needling- muscle spasming- last 1 day max.    Pain Inventory Average Pain 5 Pain Right Now 5 My pain is constant and dull  In the last 24 hours, has pain interfered with the following? General activity 5 Relation with others 0 Enjoyment of life 0 What TIME of day is your pain at its worst? morning , daytime, evening, and night Sleep (in general) Poor  Pain is worse with: walking and standing Pain improves with: medication Relief from Meds: 5  Family History  Problem Relation Age of Onset   Pulmonary embolism Mother    Sudden death Mother    Obesity Mother    Heart failure Father    Congestive Heart Failure Father    Sudden death Father    Thyroid cancer Brother    Breast cancer Neg Hx    Social History   Socioeconomic History   Marital status: Widowed    Spouse name: Not on file   Number of children: Not on file   Years of education: Not on file   Highest education level: Not on file  Occupational History   Occupation: Retired  Tobacco Use   Smoking status:  Never   Smokeless tobacco: Never  Vaping Use   Vaping Use: Never used  Substance and Sexual Activity   Alcohol use: No    Alcohol/week: 0.0 standard drinks of alcohol   Drug use: No   Sexual activity: Not on file  Other Topics Concern   Not on file  Social History Narrative   ** Merged History Encounter **       Social Determinants of Health   Financial Resource Strain: Not on file  Food Insecurity: Not on file  Transportation Needs: Not on file  Physical Activity: Not on file  Stress: Not on file  Social Connections: Not on file   Past Surgical History:  Procedure Laterality Date   APPENDECTOMY     BREAST BIOPSY Left 08/19/2004   lt bx/clip-neg   COLONOSCOPY     COLONOSCOPY WITH PROPOFOL N/A 03/10/2017   Procedure: COLONOSCOPY WITH PROPOFOL;  Surgeon: [name], MD;  Location: [facility] ENDOSCOPY;  Service: Endoscopy;  Laterality: N/A;   ESOPHAGOGASTRODUODENOSCOPY (EGD) WITH PROPOFOL N/A 02/02/2019   Procedure: ESOPHAGOGASTRODUODENOSCOPY (EGD) WITH PROPOFOL;  Surgeon: Toledo, Benay Pike, MD;  Location: [facility] ENDOSCOPY;  Service: Endoscopy;  Laterality: N/A;   IVC FILTER INSERTION  N/A 06/11/2016   Procedure: IVC Filter Insertion;  Surgeon: Katha Cabal, MD;  Location: [place] [facility] LAB;  Service: Cardiovascular;  Laterality: N/A;   IVC FILTER REMOVAL N/A 05/06/2017   Procedure: IVC FILTER REMOVAL;  Surgeon: Katha Cabal, MD;  Location: [place] [facility] LAB;  Service: Cardiovascular;  Laterality: N/A;   JOINT REPLACEMENT Left 06/17/2016   Knees, pt stated left and right have been replaced.   LAPAROSCOPIC APPENDECTOMY N/A 10/10/2018   Procedure: APPENDECTOMY LAPAROSCOPIC ATTEMPTED CONVERTED TO OPEN;  Surgeon: Herbert Pun, MD;  Location: [facility] ORS;  Service: General;  Laterality: N/A;   LAPAROSCOPY N/A 10/10/2018   Procedure: LAPAROSCOPY DIAGNOSTIC ATTEMPTED CONVERTED TO OPEN;  Surgeon: Herbert Pun, MD;  Location: [facility] ORS;  Service: General;   Laterality: N/A;   LUMBAR LAMINECTOMY/DECOMPRESSION MICRODISCECTOMY N/A 07/09/2021   Procedure: Laminectomy and Foraminotomy - Lumbar Two-Lumbar Three - Lumbar Three-Lumbar Four - Lumbar Four-Lumbar Five;  Surgeon: Earnie Larsson, MD;  Location: [place];  Service: Neurosurgery;  Laterality: N/A;   neck fusion     PARTIAL COLECTOMY Right 10/10/2018   Procedure: PARTIAL COLECTOMY;  Surgeon: Herbert Pun, MD;  Location: [facility] ORS;  Service: General;  Laterality: Right;   PERIPHERAL VASCULAR CATHETERIZATION N/A 08/15/2015   Procedure: pulmonary angiogram with lysis;  Surgeon: Katha Cabal, MD;  Location: [place] CV LAB;  Service: Cardiovascular;  Laterality: N/A;   WRIST SURGERY Left 2000   Past Surgical History:  Procedure Laterality Date   APPENDECTOMY     BREAST BIOPSY Left 08/19/2004   lt bx/clip-neg   COLONOSCOPY     COLONOSCOPY WITH PROPOFOL N/A 03/10/2017   Procedure: COLONOSCOPY WITH PROPOFOL;  Surgeon: [name], MD;  Location: [facility] ENDOSCOPY;  Service: Endoscopy;  Laterality: N/A;   ESOPHAGOGASTRODUODENOSCOPY (EGD) WITH PROPOFOL N/A 02/02/2019   Procedure: ESOPHAGOGASTRODUODENOSCOPY (EGD) WITH PROPOFOL;  Surgeon: Toledo, Benay Pike, MD;  Location: [facility] ENDOSCOPY;  Service: Endoscopy;  Laterality: N/A;   IVC FILTER INSERTION N/A 06/11/2016   Procedure: IVC Filter Insertion;  Surgeon: Katha Cabal, MD;  Location: [place] [facility] LAB;  Service: Cardiovascular;  Laterality: N/A;   IVC FILTER REMOVAL N/A 05/06/2017   Procedure: IVC FILTER REMOVAL;  Surgeon: Katha Cabal, MD;  Location: [place] [facility] LAB;  Service: Cardiovascular;  Laterality: N/A;   JOINT REPLACEMENT Left 06/17/2016   Knees, pt stated left and right have been replaced.   LAPAROSCOPIC APPENDECTOMY N/A 10/10/2018   Procedure: APPENDECTOMY LAPAROSCOPIC ATTEMPTED CONVERTED TO OPEN;  Surgeon: Herbert Pun, MD;  Location: [facility] ORS;  Service: General;  Laterality: N/A;   LAPAROSCOPY  N/A 10/10/2018   Procedure: LAPAROSCOPY DIAGNOSTIC ATTEMPTED CONVERTED TO OPEN;  Surgeon: Herbert Pun, MD;  Location: [facility] ORS;  Service: General;  Laterality: N/A;   LUMBAR LAMINECTOMY/DECOMPRESSION MICRODISCECTOMY N/A 07/09/2021   Procedure: Laminectomy and Foraminotomy - Lumbar Two-Lumbar Three - Lumbar Three-Lumbar Four - Lumbar Four-Lumbar Five;  Surgeon: Earnie Larsson, MD;  Location: [place];  Service: Neurosurgery;  Laterality: N/A;   neck fusion     PARTIAL COLECTOMY Right 10/10/2018   Procedure: PARTIAL COLECTOMY;  Surgeon: Herbert Pun, MD;  Location: [facility] ORS;  Service: General;  Laterality: Right;   PERIPHERAL VASCULAR CATHETERIZATION N/A 08/15/2015   Procedure: pulmonary angiogram with lysis;  Surgeon: Katha Cabal, MD;  Location: [place] [facility];  Service: Cardiovascular;  Laterality: N/A;   WRIST SURGERY Left 2000   Past Medical History:  Diagnosis Date   Anginal pain ([place])    atypical chest  pain   Anxiety    Arthritis    Back pain    Cancer (HCC)    cll   Chronic kidney disease    Chronic kidney disease    CLL (chronic lymphocytic leukemia) (HCC)    Depression    Epigastric pain    GERD (gastroesophageal reflux disease)    Headache    Hemorrhoids    Hypercholesteremia    Hyperlipidemia    Hypertension    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)    Macular degeneration    MGUS (monoclonal gammopathy of unknown significance)    Morbid obesity (HCC)    Obesity    Pulmonary embolism (HCC)    PVD (peripheral vascular disease) (HCC)    Sleep apnea    BP 101/69   Pulse 98   Ht '5\' 4"'$  (1.626 m)   Wt 242 lb (109.8 kg)   SpO2 95%   BMI 41.54 kg/m   Opioid Risk Score:   Fall Risk Score:  `1  Depression screen [facility] 2/9     03/11/2022    1:37 PM 11/16/2021   11:18 AM 11/05/2021   10:42 AM 06/26/2021    2:11 PM 05/22/2021    8:41 AM 05/14/2021   11:48 AM 02/07/2021   10:23 AM  Depression screen PHQ 2/9  Decreased Interest 0 2  0 0 0 0 0  Down, Depressed, Hopeless 0 0 0 0 0 0 0  PHQ - 2 Score 0 2 0 0 0 0 0  Altered sleeping  1       Tired, decreased energy  1       Change in appetite  0       Feeling bad or failure about yourself   0       Trouble concentrating  0       Moving slowly or fidgety/restless  0       Suicidal thoughts  0       PHQ-9 Score  4       Difficult doing work/chores  Very difficult        Review of Systems  Musculoskeletal:  Positive for back pain and gait problem.  All other systems reviewed and are negative.      Objective:   Physical Exam  Awake, alert, appropriate, using, rollator; NAD BMI 41  Palpable trigger points on back around lower lumbar incision/scar Mildly muscle spasms palpated on L; but a LOT on R paraspinals     Assessment & Plan:   Pt is a 81 yr old female with BMI of 41, CLL; hx of Bell's palsy; central lumbar stenosis and radiculopathy- hx of Lumbar lami L2-L5; HTN- but not lately. 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   Pain meds- Has 45 pills left and got in early August so not taking too much at all- not taking enough.   2.  Surgeon wants her to get dry needling. Lasts 1 day.   3. Will try Robaxin/Methcarbamol 500 mg 3x/day as needed- #180 for 3 months supply and 1 refill.   4. Can try trigger point injections at next appointment- if pt wants   5. Refill Norco- take when pain gets above 5-6/10- 5/325 mg- can get refills monthly if need be- 5/325 mg up to 4x/day -   6. Con't gabapentin and Duloxetine- need refills- will send in-   7. F/U in 3 months- on  chronic pain   I spent a total of   27  minutes on total care today- >50% coordination of care- due to education on pain meds and muscle spasms.

## 2022-03-11 NOTE — Patient Instructions (Signed)
Pt is a 81 yr old female with BMI of 41, CLL; hx of Bell's palsy; central lumbar stenosis and radiculopathy- hx of Lumbar lami L2-L5; HTN- but not lately. 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   Pain meds- Has 45 pills left and got in early August so not taking too much at all- not taking enough.   2.  Surgeon wants her to get dry needling. Lasts 1 day.   3. Will try Robaxin/Methcarbamol 500 mg 3x/day as needed- #180 for 3 months supply and 1 refill.   4. Can try trigger point injections at next appointment- if pt wants   5. Refill Norco- take when pain gets above 5-6/10- 5/325 mg- can get refills monthly if need be- 5/325 mg up to 4x/day -   6. Con't gabapentin and Duloxetine- need refills- will send in-   7. F/U in 3 months- on chronic pain

## 2022-03-12 DIAGNOSIS — M6281 Muscle weakness (generalized): Secondary | ICD-10-CM | POA: Diagnosis not present

## 2022-03-12 DIAGNOSIS — M545 Low back pain, unspecified: Secondary | ICD-10-CM | POA: Diagnosis not present

## 2022-03-12 DIAGNOSIS — M48062 Spinal stenosis, lumbar region with neurogenic claudication: Secondary | ICD-10-CM | POA: Diagnosis not present

## 2022-03-18 DIAGNOSIS — H353221 Exudative age-related macular degeneration, left eye, with active choroidal neovascularization: Secondary | ICD-10-CM | POA: Diagnosis not present

## 2022-03-26 DIAGNOSIS — M48062 Spinal stenosis, lumbar region with neurogenic claudication: Secondary | ICD-10-CM | POA: Diagnosis not present

## 2022-03-26 DIAGNOSIS — Z6841 Body Mass Index (BMI) 40.0 and over, adult: Secondary | ICD-10-CM | POA: Diagnosis not present

## 2022-05-06 ENCOUNTER — Telehealth: Payer: Self-pay

## 2022-05-06 NOTE — Telephone Encounter (Signed)
Patient requesting refill on hydrocodone last fill per pmp 12/12/21  Filled  Written  ID  Drug  QTY  Days  Prescriber  RX #  Dispenser  Refill  Daily Dose*  Pymt Type  PMP  12/12/2021 11/16/2021 1  Hydrocodone-Acetamin 5-325 Mg 120.00 30 Me Lov [id number] [facility] ([id number]) 0/0 20.00 MME Medicare 

## 2022-05-07 MED ORDER — HYDROCODONE-ACETAMINOPHEN 5-325 MG PO TABS: 1.0000 | ORAL_TABLET | Freq: Four times a day (QID) | ORAL | 0 refills | Status: DC | PRN

## 2022-05-10 ENCOUNTER — Telehealth: Payer: Self-pay

## 2022-05-10 NOTE — Telephone Encounter (Signed)
Hydrocodone 5-325 MG REFILL.  Filled  Written  ID  Drug  QTY  Days  Prescriber  RX #  Dispenser  Refill  Daily Dose*  Pymt Type  PMP  12/12/2021 11/16/2021 1  Hydrocodone-Acetamin 5-325 Mg 120.00 30 Me Lov [id number] [facility] ([id number]) 0/0 20.00 MME Medicare [place] 11/05/2021 11/05/2021 1  Oxycodone Hcl (Ir) 5 Mg Tablet 65.00 30 Fr Nav [id number] [facility] ([id number]) 0/0 16.25 MME Medicare 

## 2022-05-11 MED ORDER — HYDROCODONE-ACETAMINOPHEN 5-325 MG PO TABS: 1.0000 | ORAL_TABLET | Freq: Four times a day (QID) | ORAL | 0 refills | Status: DC | PRN

## 2022-05-20 ENCOUNTER — Inpatient Hospital Stay: Payer: Medicare PPO

## 2022-05-20 ENCOUNTER — Inpatient Hospital Stay: Payer: Medicare PPO | Attending: Internal Medicine | Admitting: Internal Medicine

## 2022-05-20 ENCOUNTER — Encounter: Payer: Self-pay | Admitting: Internal Medicine

## 2022-05-20 VITALS — BP 126/87 | HR 71 | Temp 97.7°F | Resp 18 | Wt 240.0 lb

## 2022-05-20 DIAGNOSIS — Z79899 Other long term (current) drug therapy: Secondary | ICD-10-CM | POA: Insufficient documentation

## 2022-05-20 DIAGNOSIS — L723 Sebaceous cyst: Secondary | ICD-10-CM | POA: Diagnosis not present

## 2022-05-20 DIAGNOSIS — Z7901 Long term (current) use of anticoagulants: Secondary | ICD-10-CM | POA: Diagnosis not present

## 2022-05-20 DIAGNOSIS — Z86711 Personal history of pulmonary embolism: Secondary | ICD-10-CM | POA: Insufficient documentation

## 2022-05-20 DIAGNOSIS — D472 Monoclonal gammopathy: Secondary | ICD-10-CM | POA: Insufficient documentation

## 2022-05-20 DIAGNOSIS — C9110 Chronic lymphocytic leukemia of B-cell type not having achieved remission: Secondary | ICD-10-CM

## 2022-05-20 DIAGNOSIS — M549 Dorsalgia, unspecified: Secondary | ICD-10-CM | POA: Diagnosis not present

## 2022-05-20 DIAGNOSIS — G8929 Other chronic pain: Secondary | ICD-10-CM | POA: Insufficient documentation

## 2022-05-20 LAB — COMPREHENSIVE METABOLIC PANEL
ALT: 10 U/L (ref 0–44)
AST: 18 U/L (ref 15–41)
Albumin: 3.6 g/dL (ref 3.5–5.0)
Alkaline Phosphatase: 93 U/L (ref 38–126)
Anion gap: 11 (ref 5–15)
BUN: 19 mg/dL (ref 8–23)
CO2: 28 mmol/L (ref 22–32)
Calcium: 9.2 mg/dL (ref 8.9–10.3)
Chloride: 101 mmol/L (ref 98–111)
Creatinine, Ser: 1.12 mg/dL — ABNORMAL HIGH (ref 0.44–1.00)
GFR, Estimated: 50 mL/min — ABNORMAL LOW (ref >60)
Glucose, Bld: 118 mg/dL — ABNORMAL HIGH (ref 70–99)
Potassium: 3.8 mmol/L (ref 3.5–5.1)
Sodium: 140 mmol/L (ref 135–145)
Total Bilirubin: 0.4 mg/dL (ref 0.3–1.2)
Total Protein: 6.6 g/dL (ref 6.5–8.1)

## 2022-05-20 LAB — CBC WITH DIFFERENTIAL/PLATELET
Abs Immature Granulocytes: 0.03 10*3/uL (ref 0.00–0.07)
Basophils Absolute: 0.1 10*3/uL (ref 0.0–0.1)
Basophils Relative: 0 %
Eosinophils Absolute: 0.3 10*3/uL (ref 0.0–0.5)
Eosinophils Relative: 2 %
HCT: 40.3 % (ref 36.0–46.0)
Hemoglobin: 12.8 g/dL (ref 12.0–15.0)
Immature Granulocytes: 0 %
Lymphocytes Relative: 62 %
Lymphs Abs: 8.6 10*3/uL — ABNORMAL HIGH (ref 0.7–4.0)
MCH: 29.8 pg (ref 26.0–34.0)
MCHC: 31.8 g/dL (ref 30.0–36.0)
MCV: 93.7 fL (ref 80.0–100.0)
Monocytes Absolute: 0.9 10*3/uL (ref 0.1–1.0)
Monocytes Relative: 6 %
Neutro Abs: 4.1 10*3/uL (ref 1.7–7.7)
Neutrophils Relative %: 30 %
Platelets: 271 10*3/uL (ref 150–400)
RBC: 4.3 MIL/uL (ref 3.87–5.11)
RDW: 15.6 % — ABNORMAL HIGH (ref 11.5–15.5)
Smear Review: NORMAL
WBC: 14 10*3/uL — ABNORMAL HIGH (ref 4.0–10.5)
nRBC: 0 % (ref 0.0–0.2)

## 2022-05-20 LAB — LACTATE DEHYDROGENASE: LDH: 161 U/L (ref 98–192)

## 2022-05-20 NOTE — Assessment & Plan Note (Addendum)
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  Continue Eliquis- Refill Eliquis with Dr.Anderson.  No concerns for fall/bleeding.stable.    # Chronic low-grade B-cell lymphoproliferative disorder/CLL-hemoglobin platelets normal.  Platelets slightly elevated 14 absolute lymphocyte count 4-5,000.  Asymptomatic/continue surveillance.stable.    # scalp sebaceous cyst- defer to PCP.   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 JUNE 2023- 0.3 gm/dl; K/l 4.8-  Again repeat in 6 months.  Pending today. stable.    # fatigue-history of obstructive sleep apnea/continue CPAP.stable.    #Chronic joint pain back pain-defer to pain clinic orthopedic.stable.    # DISPOSITION: # follow up in 6 months- MD labs-[ cbc/cmp/ldh/MM panel; K-light chains]-Dr.B

## 2022-05-20 NOTE — Progress Notes (Signed)
Patient denies new problems/concerns today.    Dealing with chronic back problems that is being treated elsewhere.

## 2022-05-20 NOTE — Progress Notes (Signed)
[facility] OFFICE PROGRESS NOTE  Patient Care Team: Kirk Ruths, MD as PCP - General (Internal Medicine) Bary Castilla, Forest Gleason, MD (General Surgery) Meeler, Sherren Kerns, FNP as Referring Physician (Family Medicine) Cammie Sickle, MD as Consulting Physician (Oncology); Dr.harold Jefm Bryant;    SUMMARY OF ONCOLOGIC HISTORY:  Oncology History Overview Note  # April 2017- Bil Massive Pulmonary embolus/R LE DVT- s/p Thrombolysis [Dr.Schnier, [facility]]-Eliquis; NEG factor V leide/ Prothrombin gene. S/p IV Filter explantation.    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  2) CD56 expression on monocytes.   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  --------------------------------------------------------  DIAGNOSIS: CLL  STAGE: I        ;GOALS: COntrol  CURRENT/MOST RECENT THERAPY: surveillaince    CLL (chronic lymphocytic leukemia) (HCC)     INTERVAL HISTORY: Alone.  Ambulating with a cane.  A pleasant 82 year old female patient with above history of History of CLL- on surveillance; and history of bilateral PE- on Eliquis is here for follow-up.  Patient denies new problems/concerns today.  Dealing with chronic back problems that is being treated with Dr.Poole, [place].   Patient continues to have chronic joint pains back pain.  Not any worse.  She denies any blood in stools or black or stools.    No night sweats.  No fevers.  No new lumps or bumps.  Chronic fatigue.  Review of Systems  Constitutional:  Positive for malaise/fatigue. Negative for chills, diaphoresis, fever and  weight loss.  HENT:  Negative for nosebleeds and sore throat.   Eyes:  Negative for double vision.  Respiratory:  Negative for cough, hemoptysis, sputum production, shortness of breath and wheezing.   Cardiovascular:  Negative for chest pain, palpitations, orthopnea and leg swelling.  Gastrointestinal:  Negative for abdominal pain, blood in stool, constipation, diarrhea, heartburn, melena, nausea and vomiting.  Genitourinary:  Negative for dysuria, frequency and urgency.  Musculoskeletal:  Positive for back pain, joint pain and neck pain.  Skin: Negative.  Negative for itching and rash.  Neurological:  Negative for dizziness, tingling, focal weakness, weakness and headaches.  Endo/Heme/Allergies:  Does not bruise/bleed easily.  Psychiatric/Behavioral:  Negative for depression. The patient is not nervous/anxious and does not have insomnia.     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)    Macular degeneration    MGUS (monoclonal gammopathy of unknown significance)    Morbid obesity (HCC)    Obesity    Pulmonary embolism (HCC)    PVD (peripheral vascular disease) (HCC)    Sleep apnea     PAST SURGICAL  HISTORY :   Past Surgical History:  Procedure Laterality Date   APPENDECTOMY     BREAST BIOPSY Left 08/19/2004   lt bx/clip-neg   COLONOSCOPY     COLONOSCOPY WITH PROPOFOL N/A 03/10/2017   Procedure: COLONOSCOPY WITH PROPOFOL;  Surgeon: [name], MD;  Location: [facility] ENDOSCOPY;  Service: Endoscopy;  Laterality: N/A;    ESOPHAGOGASTRODUODENOSCOPY (EGD) WITH PROPOFOL N/A 02/02/2019   Procedure: ESOPHAGOGASTRODUODENOSCOPY (EGD) WITH PROPOFOL;  Surgeon: Toledo, Benay Pike, MD;  Location: [facility] ENDOSCOPY;  Service: Endoscopy;  Laterality: N/A;   IVC FILTER INSERTION N/A 06/11/2016   Procedure: IVC Filter Insertion;  Surgeon: Katha Cabal, MD;  Location: [place] CV LAB;  Service: Cardiovascular;  Laterality: N/A;   IVC FILTER REMOVAL N/A 05/06/2017   Procedure: IVC FILTER REMOVAL;  Surgeon: Katha Cabal, MD;  Location: [place] [facility] LAB;  Service: Cardiovascular;  Laterality: N/A;   JOINT REPLACEMENT Left 06/17/2016   Knees, pt stated left and right have been replaced.   LAPAROSCOPIC APPENDECTOMY N/A 10/10/2018   Procedure: APPENDECTOMY LAPAROSCOPIC ATTEMPTED CONVERTED TO OPEN;  Surgeon: Herbert Pun, MD;  Location: [facility] ORS;  Service: General;  Laterality: N/A;   LAPAROSCOPY N/A 10/10/2018   Procedure: LAPAROSCOPY DIAGNOSTIC ATTEMPTED CONVERTED TO OPEN;  Surgeon: Herbert Pun, MD;  Location: [facility] ORS;  Service: General;  Laterality: N/A;   LUMBAR LAMINECTOMY/DECOMPRESSION MICRODISCECTOMY N/A 07/09/2021   Procedure: Laminectomy and Foraminotomy - Lumbar Two-Lumbar Three - Lumbar Three-Lumbar Four - Lumbar Four-Lumbar Five;  Surgeon: Earnie Larsson, MD;  Location: [place];  Service: Neurosurgery;  Laterality: N/A;   neck fusion     PARTIAL COLECTOMY Right 10/10/2018   Procedure: PARTIAL COLECTOMY;  Surgeon: Herbert Pun, MD;  Location: [facility] ORS;  Service: General;  Laterality: Right;   PERIPHERAL VASCULAR CATHETERIZATION N/A 08/15/2015   Procedure: pulmonary angiogram with lysis;  Surgeon: Katha Cabal, MD;  Location: [place] [facility];  Service: Cardiovascular;  Laterality: N/A;   WRIST SURGERY Left 2000    FAMILY HISTORY :   Family History  Problem Relation Age of Onset   Pulmonary embolism Mother    Sudden death Mother    Obesity Mother    Heart failure Father     Congestive Heart Failure Father    Sudden death Father    Thyroid cancer Brother    Breast cancer Neg Hx     SOCIAL HISTORY:   Social History   Tobacco Use   Smoking status: Never   Smokeless tobacco: Never  Vaping Use   Vaping Use: Never used  Substance Use Topics   Alcohol use: No    Alcohol/week: 0.0 standard drinks of alcohol   Drug use: No    ALLERGIES:  has No Known Allergies.  MEDICATIONS:  Current Outpatient Medications  Medication Sig Dispense Refill   acetaminophen (TYLENOL) 650 MG CR tablet Take 650-1,300 mg by mouth every 8 (eight) hours as needed for pain. TYLENOL ARTHRITIS     apixaban (ELIQUIS) 5 MG TABS tablet Take 1 tablet (5 mg total) by mouth 2 (two) times daily. 180 tablet 3   b complex vitamins capsule Take 1 capsule by mouth daily.     calcium carbonate (OSCAL) 1500 (600 Ca) MG TABS tablet Take 1,500 mg by mouth 2 (two) times daily with a meal.     cholestyramine (QUESTRAN) 4 g packet Take 1 packet (4 g total) by mouth 3 (three) times daily with meals. (Patient taking differently: Take 4 g by mouth 3 (three) times  daily as needed.) 60 each 0   DULoxetine (CYMBALTA) 60 MG capsule Take 1 capsule (60 mg total) by mouth daily. 90 capsule 1   escitalopram (LEXAPRO) 10 MG tablet Take 10 mg by mouth daily.     gabapentin (NEURONTIN) 400 MG capsule Take 1 capsule (400 mg total) by mouth 3 (three) times daily. Take 1 pill in Am and 2 pills at night- for nerve pain 270 capsule 1   HYDROcodone-acetaminophen (NORCO) 5-325 MG tablet Take 1 tablet by mouth every 6 (six) hours as needed for moderate pain. 120 tablet 0   lidocaine (LIDODERM) 5 % Place 1 patch onto the skin daily. 12 hors on; 12 hrs off- 8am to 8pm- apply to R low back. 90 patch 1   losartan-hydrochlorothiazide (HYZAAR) 100-12.5 MG tablet Take 0.5 tablets by mouth daily. Reported on 08/22/2015 30 tablet    magnesium gluconate (MAGONATE) 500 MG tablet Take 500 mg by mouth 2 (two) times daily.      methocarbamol (ROBAXIN) 500 MG tablet Take 1 tablet (500 mg total) by mouth every 8 (eight) hours as needed for muscle spasms. 180 tablet 1   Multiple Vitamins-Minerals (PRESERVISION AREDS 2 PO) Take 1 tablet by mouth in the morning and at bedtime.     pantoprazole (PROTONIX) 40 MG tablet Take 40 mg by mouth daily.     pravastatin (PRAVACHOL) 40 MG tablet Take 1 tablet by mouth daily.     propranolol (INDERAL) 40 MG tablet Take 40 mg by mouth daily.     sucralfate (CARAFATE) 1 g tablet Take 1 g by mouth daily.     TURMERIC CURCUMIN PO Take 1 capsule by mouth in the morning and at bedtime.     No current facility-administered medications for this visit.    PHYSICAL EXAMINATION:   BP 126/87 (BP Location: Right Arm, Patient Position: Sitting)   Pulse 71   Temp 97.7 F (36.5 C) (Tympanic)   Resp 18   Wt 240 lb (108.9 kg)   BMI 41.20 kg/m   Filed Weights   05/20/22 1000  Weight: 240 lb (108.9 kg)    Physical Exam Constitutional:      Comments: Obese.  Walks by herself.  HENT:     Head: Normocephalic and atraumatic.     Mouth/Throat:     Pharynx: No oropharyngeal exudate.  Eyes:     Pupils: Pupils are equal, round, and reactive to light.  Cardiovascular:     Rate and Rhythm: Normal rate and regular rhythm.  Pulmonary:     Effort: No respiratory distress.     Breath sounds: No wheezing.  Abdominal:     General: Bowel sounds are normal. There is no distension.     Palpations: Abdomen is soft. There is no mass.     Tenderness: There is no abdominal tenderness. There is no guarding or rebound.  Musculoskeletal:        General: No tenderness. Normal range of motion.     Cervical back: Normal range of motion and neck supple.  Skin:    General: Skin is warm.     Comments: Chronic bruises.  Neurological:     Mental Status: She is alert and oriented to person, place, and time.  Psychiatric:        Mood and Affect: Affect normal.      LABORATORY DATA:  I have reviewed the  data as listed    Component Value Date/Time   NA 141 09/28/2021 1019   NA 142  12/26/2020 1112   NA 141 04/29/2012 1822   K 3.7 09/28/2021 1019   K 3.9 04/29/2012 1822   CL 102 09/28/2021 1019   CL 107 04/29/2012 1822   CO2 31 09/28/2021 1019   CO2 28 04/29/2012 1822   GLUCOSE 113 (H) 09/28/2021 1019   GLUCOSE 129 (H) 04/29/2012 1822   BUN 26 (H) 09/28/2021 1019   BUN 15 12/26/2020 1112   BUN 21 (H) 04/29/2012 1822   CREATININE 0.99 09/28/2021 1019   CREATININE 1.02 (H) 07/28/2014 0950   CALCIUM 9.0 09/28/2021 1019   CALCIUM 8.7 04/29/2012 1822   PROT 6.6 09/28/2021 1019   PROT 5.8 (L) 12/26/2020 1112   PROT 7.1 04/29/2012 1822   ALBUMIN 3.7 09/28/2021 1019   ALBUMIN 4.0 12/26/2020 1112   ALBUMIN 3.3 (L) 04/29/2012 1822   AST 17 09/28/2021 1019   AST 20 04/29/2012 1822   ALT 12 09/28/2021 1019   ALT 33 04/29/2012 1822   ALKPHOS 93 09/28/2021 1019   ALKPHOS 169 (H) 04/29/2012 1822   BILITOT 0.6 09/28/2021 1019   BILITOT 0.3 12/26/2020 1112   BILITOT 0.3 04/29/2012 1822   GFRNONAA 58 (L) 09/28/2021 1019   GFRNONAA 55 (L) 07/28/2014 0950   GFRAA 55 (L) 09/03/2019 0830   GFRAA >60 07/28/2014 0950    No results found for: "SPEP", "UPEP"  Lab Results  Component Value Date   WBC 14.0 (H) 05/20/2022   NEUTROABS PENDING 05/20/2022   HGB 12.8 05/20/2022   HCT 40.3 05/20/2022   MCV 93.7 05/20/2022   PLT 271 05/20/2022      Chemistry      Component Value Date/Time   NA 141 09/28/2021 1019   NA 142 12/26/2020 1112   NA 141 04/29/2012 1822   K 3.7 09/28/2021 1019   K 3.9 04/29/2012 1822   CL 102 09/28/2021 1019   CL 107 04/29/2012 1822   CO2 31 09/28/2021 1019   CO2 28 04/29/2012 1822   BUN 26 (H) 09/28/2021 1019   BUN 15 12/26/2020 1112   BUN 21 (H) 04/29/2012 1822   CREATININE 0.99 09/28/2021 1019   CREATININE 1.02 (H) 07/28/2014 0950      Component Value Date/Time   CALCIUM 9.0 09/28/2021 1019   CALCIUM 8.7 04/29/2012 1822   ALKPHOS 93 09/28/2021 1019    ALKPHOS 169 (H) 04/29/2012 1822   AST 17 09/28/2021 1019   AST 20 04/29/2012 1822   ALT 12 09/28/2021 1019   ALT 33 04/29/2012 1822   BILITOT 0.6 09/28/2021 1019   BILITOT 0.3 12/26/2020 1112   BILITOT 0.3 04/29/2012 1822        ASSESSMENT & PLAN:   CLL (chronic lymphocytic leukemia) (HCC) # Massive bilateral Pulmonary Embolus-on indefinite anticoagulation.  Continue Eliquis- Refill Eliquis with Dr.Anderson.  No concerns for fall/bleeding.stable.    # Chronic low-grade B-cell lymphoproliferative disorder/CLL-hemoglobin platelets normal.  Platelets slightly elevated 14 absolute lymphocyte count 4-5,000.  Asymptomatic/continue surveillance.stable.    # scalp sebaceous cyst- defer to PCP.   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 JUNE 2023- 0.3 gm/dl; K/l 4.8-  Again repeat in 6 months.  Pending today. stable.    # fatigue-history of obstructive sleep apnea/continue CPAP.stable.    #Chronic joint pain back pain-defer to pain clinic orthopedic.stable.    # DISPOSITION: # follow up in 6 months- MD labs-[ cbc/cmp/ldh/MM panel; K-light chains]-Dr.B    Cammie Sickle, MD 05/20/2022 11:01 AM

## 2022-05-21 ENCOUNTER — Inpatient Hospital Stay: Payer: Medicare PPO

## 2022-05-21 ENCOUNTER — Inpatient Hospital Stay: Payer: Medicare PPO | Admitting: Internal Medicine

## 2022-05-21 LAB — KAPPA/LAMBDA LIGHT CHAINS
Kappa free light chain: 45.2 mg/L — ABNORMAL HIGH (ref 3.3–19.4)
Kappa, lambda light chain ratio: 5.45 — ABNORMAL HIGH (ref 0.26–1.65)
Lambda free light chains: 8.3 mg/L (ref 5.7–26.3)

## 2022-05-23 LAB — MULTIPLE MYELOMA PANEL, SERUM
Albumin SerPl Elph-Mcnc: 3.3 g/dL (ref 2.9–4.4)
Albumin/Glob SerPl: 1.3 (ref 0.7–1.7)
Alpha 1: 0.2 g/dL (ref 0.0–0.4)
Alpha2 Glob SerPl Elph-Mcnc: 0.8 g/dL (ref 0.4–1.0)
B-Globulin SerPl Elph-Mcnc: 1.2 g/dL (ref 0.7–1.3)
Gamma Glob SerPl Elph-Mcnc: 0.4 g/dL (ref 0.4–1.8)
Globulin, Total: 2.7 g/dL (ref 2.2–3.9)
IgA: 425 mg/dL — ABNORMAL HIGH (ref 64–422)
IgG (Immunoglobin G), Serum: 469 mg/dL — ABNORMAL LOW (ref 586–1602)
IgM (Immunoglobulin M), Srm: 51 mg/dL (ref 26–217)
M Protein SerPl Elph-Mcnc: 0.3 g/dL — ABNORMAL HIGH
Total Protein ELP: 6 g/dL (ref 6.0–8.5)

## 2022-06-24 ENCOUNTER — Encounter: Payer: Self-pay | Admitting: Physical Medicine and Rehabilitation

## 2022-06-24 ENCOUNTER — Encounter: Payer: Medicare PPO | Attending: Physical Medicine and Rehabilitation | Admitting: Physical Medicine and Rehabilitation

## 2022-06-24 ENCOUNTER — Other Ambulatory Visit: Payer: Self-pay | Admitting: Physical Medicine and Rehabilitation

## 2022-06-24 VITALS — BP 120/80 | HR 60 | Ht 64.0 in | Wt 239.0 lb

## 2022-06-24 DIAGNOSIS — M5417 Radiculopathy, lumbosacral region: Secondary | ICD-10-CM | POA: Diagnosis not present

## 2022-06-24 DIAGNOSIS — G894 Chronic pain syndrome: Secondary | ICD-10-CM | POA: Insufficient documentation

## 2022-06-24 DIAGNOSIS — M48061 Spinal stenosis, lumbar region without neurogenic claudication: Secondary | ICD-10-CM | POA: Diagnosis not present

## 2022-06-24 DIAGNOSIS — M5441 Lumbago with sciatica, right side: Secondary | ICD-10-CM | POA: Diagnosis not present

## 2022-06-24 DIAGNOSIS — Z79891 Long term (current) use of opiate analgesic: Secondary | ICD-10-CM | POA: Insufficient documentation

## 2022-06-24 DIAGNOSIS — G8929 Other chronic pain: Secondary | ICD-10-CM | POA: Diagnosis not present

## 2022-06-24 DIAGNOSIS — M5442 Lumbago with sciatica, left side: Secondary | ICD-10-CM | POA: Insufficient documentation

## 2022-06-24 DIAGNOSIS — Z5181 Encounter for therapeutic drug level monitoring: Secondary | ICD-10-CM | POA: Insufficient documentation

## 2022-06-24 NOTE — Patient Instructions (Signed)
Pt is a 82 yr old female with BMI of 41, CLL; hx of Bell's palsy; central lumbar stenosis and radiculopathy- hx of Lumbar lami L2-L5; HTN- but not lately. 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  Here for f/u on chronic pain.     Can get Epidural steroid injection at Dr Marchelle Folks office, by Dr Davy Pique- call Neurosurgery office and say Dr Annette Stable agreed tfor you to see Dr Davy Pique for [facility]- -has to also come off blood thinner for Epidural steroid injection, FYI.   2.  Tramadol also wasn't real helpful in past-    Norco also only takes the edge off.    3. Try Gabapentin 400 mg in Am and 1200 mg/3 tabs at night- Cr 1.12- try the gabapentin increase, call me if works or doesn't work, to let me know.   4. In 1 month, can try to switch to Lyrica if Gabapentin not working well still.   5. Doesn't need refills today! Has 40 pills of Norco left. Call me when you need it!  6.  Continue to take Tylenol at current doses- 2000-300 mg/day with no issues.    7. Con't Duloxetine- 60 mg nightly- for nerve pain   8. Likes horse lineament not voltaren gel on R foot for neuropathy.   9.  F/U in 3 months- call me in 1 month to let me know if gabapentin change worked.

## 2022-06-24 NOTE — Progress Notes (Signed)
Subjective:    Patient ID: Jamie Phillips, female    DOB: 06-Feb-1941, 82 y.o.   MRN: [id number]  HPI  Pt is a 82 yr old female with BMI of 41, CLL; hx of Bell's palsy; central lumbar stenosis and radiculopathy- hx of Lumbar lami L2-L5; HTN- but not lately. 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  Here for f/u on chronic pain.    Things about the same Still hurts in 1 spot on R low back- next to incision.  Dry needling it's like they never did it by the next day. Doesn't see point about doing it.    Hurts in that 1 spot- was told muscle drawing up.  So much better since before surgery- but still bothering her.   Takes Norco- when hurts really bad- some days doesn't take it at all- In a day, most will take 2 tabs- in AM and PM- doesn't want to get hooked on it.   Cannot say if really helps, but thinks it takes edge off.  Tylenol arthritis helps more than anything.     Pain Inventory Average Pain 4 Pain Right Now 4 My pain is constant and dull  In the last 24 hours, has pain interfered with the following? General activity 7 Relation with others 0 Enjoyment of life 0 What TIME of day is your pain at its worst? morning , daytime, evening, night, and varies Sleep (in general) Fair  Pain is worse with: walking, sitting, standing, and some activites Pain improves with: rest, heat/ice, and medication Relief from Meds: 2  Family History  Problem Relation Age of Onset   Pulmonary embolism Mother    Sudden death Mother    Obesity Mother    Heart failure Father    Congestive Heart Failure Father    Sudden death Father    Thyroid cancer Brother    Breast cancer Neg Hx    Social History   Socioeconomic History   Marital status: Widowed    Spouse name: Not on file   Number of children: Not on file   Years of education: Not on file   Highest education level: Not on file  Occupational History    Occupation: Retired  Tobacco Use   Smoking status: Never   Smokeless tobacco: Never  Vaping Use   Vaping Use: Never used  Substance and Sexual Activity   Alcohol use: No    Alcohol/week: 0.0 standard drinks of alcohol   Drug use: No   Sexual activity: Not on file  Other Topics Concern   Not on file  Social History Narrative   ** Merged History Encounter **       Social Determinants of Health   Financial Resource Strain: Not on file  Food Insecurity: Not on file  Transportation Needs: Not on file  Physical Activity: Not on file  Stress: Not on file  Social Connections: Not on file   Past Surgical History:  Procedure Laterality Date   APPENDECTOMY     BREAST BIOPSY Left 08/19/2004   lt bx/clip-neg   COLONOSCOPY     COLONOSCOPY WITH PROPOFOL N/A 03/10/2017   Procedure: COLONOSCOPY WITH PROPOFOL;  Surgeon: [name], MD;  Location: [facility] ENDOSCOPY;  Service: Endoscopy;  Laterality: N/A;   ESOPHAGOGASTRODUODENOSCOPY (EGD) WITH PROPOFOL N/A 02/02/2019   Procedure: ESOPHAGOGASTRODUODENOSCOPY (EGD) WITH PROPOFOL;  Surgeon: Toledo, Benay Pike, MD;  Location: [facility] ENDOSCOPY;  Service: Endoscopy;  Laterality: N/A;   IVC FILTER INSERTION N/A 06/11/2016   Procedure: IVC Filter Insertion;  Surgeon: Katha Cabal, MD;  Location: [place] CV LAB;  Service: Cardiovascular;  Laterality: N/A;   IVC FILTER REMOVAL N/A 05/06/2017   Procedure: IVC FILTER REMOVAL;  Surgeon: Katha Cabal, MD;  Location: [place] [facility] LAB;  Service: Cardiovascular;  Laterality: N/A;   JOINT REPLACEMENT Left 06/17/2016   Knees, pt stated left and right have been replaced.   LAPAROSCOPIC APPENDECTOMY N/A 10/10/2018   Procedure: APPENDECTOMY LAPAROSCOPIC ATTEMPTED CONVERTED TO OPEN;  Surgeon: Herbert Pun, MD;  Location: [facility] ORS;  Service: General;  Laterality: N/A;   LAPAROSCOPY N/A 10/10/2018   Procedure: LAPAROSCOPY DIAGNOSTIC ATTEMPTED CONVERTED TO OPEN;  Surgeon: Herbert Pun, MD;  Location: [facility] ORS;  Service: General;  Laterality: N/A;   LUMBAR LAMINECTOMY/DECOMPRESSION MICRODISCECTOMY N/A 07/09/2021   Procedure: Laminectomy and Foraminotomy - Lumbar Two-Lumbar Three - Lumbar Three-Lumbar Four - Lumbar Four-Lumbar Five;  Surgeon: Earnie Larsson, MD;  Location: [place];  Service: Neurosurgery;  Laterality: N/A;   neck fusion     PARTIAL COLECTOMY Right 10/10/2018   Procedure: PARTIAL COLECTOMY;  Surgeon: Herbert Pun, MD;  Location: [facility] ORS;  Service: General;  Laterality: Right;   PERIPHERAL VASCULAR CATHETERIZATION N/A 08/15/2015   Procedure: pulmonary angiogram with lysis;  Surgeon: Katha Cabal, MD;  Location: [place] [facility] LAB;  Service: Cardiovascular;  Laterality: N/A;   WRIST SURGERY Left 2000   Past Surgical History:  Procedure Laterality Date   APPENDECTOMY     BREAST BIOPSY Left 08/19/2004   lt bx/clip-neg   COLONOSCOPY     COLONOSCOPY WITH PROPOFOL N/A 03/10/2017   Procedure: COLONOSCOPY WITH PROPOFOL;  Surgeon: [name], MD;  Location: [facility] ENDOSCOPY;  Service: Endoscopy;  Laterality: N/A;   ESOPHAGOGASTRODUODENOSCOPY (EGD) WITH PROPOFOL N/A 02/02/2019   Procedure: ESOPHAGOGASTRODUODENOSCOPY (EGD) WITH PROPOFOL;  Surgeon: Toledo, Benay Pike, MD;  Location: [facility] ENDOSCOPY;  Service: Endoscopy;  Laterality: N/A;   IVC FILTER INSERTION N/A 06/11/2016   Procedure: IVC Filter Insertion;  Surgeon: Katha Cabal, MD;  Location: [place] [facility] LAB;  Service: Cardiovascular;  Laterality: N/A;   IVC FILTER REMOVAL N/A 05/06/2017   Procedure: IVC FILTER REMOVAL;  Surgeon: Katha Cabal, MD;  Location: [place] [facility] LAB;  Service: Cardiovascular;  Laterality: N/A;   JOINT REPLACEMENT Left 06/17/2016   Knees, pt stated left and right have been replaced.   LAPAROSCOPIC APPENDECTOMY N/A 10/10/2018   Procedure: APPENDECTOMY LAPAROSCOPIC ATTEMPTED CONVERTED TO OPEN;  Surgeon: Herbert Pun, MD;  Location: [facility]  ORS;  Service: General;  Laterality: N/A;   LAPAROSCOPY N/A 10/10/2018   Procedure: LAPAROSCOPY DIAGNOSTIC ATTEMPTED CONVERTED TO OPEN;  Surgeon: Herbert Pun, MD;  Location: [facility] ORS;  Service: General;  Laterality: N/A;   LUMBAR LAMINECTOMY/DECOMPRESSION MICRODISCECTOMY N/A 07/09/2021   Procedure: Laminectomy and Foraminotomy - Lumbar Two-Lumbar Three - Lumbar Three-Lumbar Four - Lumbar Four-Lumbar Five;  Surgeon: Earnie Larsson, MD;  Location: [place];  Service: Neurosurgery;  Laterality: N/A;   neck fusion     PARTIAL COLECTOMY Right 10/10/2018   Procedure: PARTIAL COLECTOMY;  Surgeon: Herbert Pun, MD;  Location: [facility] ORS;  Service: General;  Laterality: Right;   PERIPHERAL VASCULAR CATHETERIZATION N/A 08/15/2015   Procedure: pulmonary angiogram with lysis;  Surgeon: Katha Cabal, MD;  Location: [place] [facility] LAB;  Service: Cardiovascular;  Laterality: N/A;   WRIST SURGERY Left 2000   Past Medical History:  Diagnosis Date   Anginal  pain ([place])    atypical chest pain   Anxiety    Arthritis    Back pain    Cancer (HCC)    cll   Chronic kidney disease    Chronic kidney disease    CLL (chronic lymphocytic leukemia) (HCC)    Depression    Epigastric pain    GERD (gastroesophageal reflux disease)    Headache    Hemorrhoids    Hypercholesteremia    Hyperlipidemia    Hypertension    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)    Macular degeneration    MGUS (monoclonal gammopathy of unknown significance)    Morbid obesity (HCC)    Obesity    Pulmonary embolism (HCC)    PVD (peripheral vascular disease) (HCC)    Sleep apnea    BP 120/80   Pulse 60   Ht '5\' 4"'$  (1.626 m)   Wt 239 lb (108.4 kg)   SpO2 95%   BMI 41.02 kg/m   Opioid Risk Score:   Fall Risk Score:  `1  Depression screen PHQ 2/9     06/24/2022    1:46 PM 03/11/2022    1:37 PM 11/16/2021   11:18 AM 11/05/2021   10:42 AM 06/26/2021    2:11 PM 05/22/2021    8:41 AM 05/14/2021   11:48  AM  Depression screen PHQ 2/9  Decreased Interest 1 0 2 0 0 0 0  Down, Depressed, Hopeless 1 0 0 0 0 0 0  PHQ - 2 Score 2 0 2 0 0 0 0  Altered sleeping   1      Tired, decreased energy   1      Change in appetite   0      Feeling bad or failure about yourself    0      Trouble concentrating   0      Moving slowly or fidgety/restless   0      Suicidal thoughts   0      PHQ-9 Score   4      Difficult doing work/chores   Very difficult        Review of Systems  Musculoskeletal:  Positive for back pain and gait problem.       Off balance  All other systems reviewed and are negative.      Objective:   Physical Exam Awake, alert, appropriate, sitting on chair, NAD BMI 41 R L4/5 paraspinals TTP- has associated trigger point injections  (+) SLR R foot mild swelling 1+ compared to L foot which has trace toes.       Assessment & Plan:   Pt is a 82 yr old female with BMI of 41, CLL; hx of Bell's palsy; central lumbar stenosis and radiculopathy- hx of Lumbar lami L2-L5; HTN- but not lately. 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  Here for f/u on chronic pain.     Can get Epidural steroid injection at Dr Marchelle Folks office, by Dr Davy Pique- call Neurosurgery office and say Dr Annette Stable agreed tfor you to see Dr Davy Pique for [facility]- -has to also come off blood thinner for Epidural steroid injection, FYI.   2.  Tramadol also wasn't real helpful in past-    Norco also only takes the edge off.    3. Try Gabapentin 400 mg in Am and 1200 mg/3 tabs at night- Cr 1.12- try the gabapentin increase,  call me if works or doesn't work, to let me know.   4. In 1 month, can try to switch to Lyrica if Gabapentin not working well still.   5. Doesn't need refills today! Has 40 pills of Norco left. Call me when you need it!  6.  Continue to take Tylenol at current doses- 2000-300 mg/day with no issues.    7. Con't Duloxetine- 60 mg nightly-  for nerve pain   8. Likes horse lineament not voltaren gel on R foot for neuropathy.   9.  F/U in 3 months- call me in 1 month to let me know if gabapentin change worked.    I spent a total of  22  minutes on total care today- >50% coordination of care- due to discussion with Dr Annette Stable while in room with pt about ESI as well as d/w pt about options for nerve pain/back pain- decided to increase gabapentin.

## 2022-06-27 LAB — DRUG TOX MONITOR 1 W/CONF, ORAL FLD
Alprazolam: NEGATIVE ng/mL (ref <0.50)
Amphetamines: NEGATIVE ng/mL (ref <10)
Barbiturates: NEGATIVE ng/mL (ref <10)
Benzodiazepines: NEGATIVE ng/mL (ref <0.50)
Buprenorphine: NEGATIVE ng/mL (ref <0.10)
Chlordiazepoxide: NEGATIVE ng/mL (ref <0.50)
Clonazepam: NEGATIVE ng/mL (ref <0.50)
Cocaine: NEGATIVE ng/mL (ref <5.0)
Codeine: NEGATIVE ng/mL (ref <2.5)
Diazepam: NEGATIVE ng/mL (ref <0.50)
Dihydrocodeine: 3.1 ng/mL — ABNORMAL HIGH (ref <2.5)
Fentanyl: NEGATIVE ng/mL (ref <0.10)
Flunitrazepam: NEGATIVE ng/mL (ref <0.50)
Heroin Metabolite: NEGATIVE ng/mL (ref <1.0)
Hydrocodone: 118 ng/mL — ABNORMAL HIGH (ref <2.5)
Hydromorphone: NEGATIVE ng/mL (ref <2.5)
Lorazepam: NEGATIVE ng/mL (ref <0.50)
MARIJUANA: NEGATIVE ng/mL (ref <2.5)
MDMA: NEGATIVE ng/mL (ref <10)
Meprobamate: NEGATIVE ng/mL (ref <2.5)
Methadone: NEGATIVE ng/mL (ref <5.0)
Midazolam: NEGATIVE ng/mL (ref <0.50)
Morphine: NEGATIVE ng/mL (ref <2.5)
Nicotine Metabolite: NEGATIVE ng/mL (ref <5.0)
Nordiazepam: NEGATIVE ng/mL (ref <0.50)
Norhydrocodone: 4.5 ng/mL — ABNORMAL HIGH (ref <2.5)
Noroxycodone: NEGATIVE ng/mL (ref <2.5)
Opiates: POSITIVE ng/mL — AB (ref <2.5)
Oxycodone: NEGATIVE ng/mL (ref <2.5)
Oxymorphone: NEGATIVE ng/mL (ref <2.5)
Phencyclidine: NEGATIVE ng/mL (ref <10)
Tapentadol: NEGATIVE ng/mL (ref <5.0)
Temazepam: NEGATIVE ng/mL (ref <0.50)
Tramadol: NEGATIVE ng/mL (ref <5.0)
Triazolam: NEGATIVE ng/mL (ref <0.50)
Zolpidem: NEGATIVE ng/mL (ref <5.0)

## 2022-06-27 LAB — DRUG TOX ALC METAB W/CON, ORAL FLD: Alcohol Metabolite: NEGATIVE ng/mL (ref <25)

## 2022-07-04 ENCOUNTER — Telehealth: Payer: Self-pay | Admitting: *Deleted

## 2022-07-04 DIAGNOSIS — M48062 Spinal stenosis, lumbar region with neurogenic claudication: Secondary | ICD-10-CM | POA: Diagnosis not present

## 2022-07-04 NOTE — Telephone Encounter (Signed)
Oral swab drug screen was consistent for prescribed medications.  ?

## 2022-07-08 DIAGNOSIS — H353114 Nonexudative age-related macular degeneration, right eye, advanced atrophic with subfoveal involvement: Secondary | ICD-10-CM | POA: Diagnosis not present

## 2022-07-08 DIAGNOSIS — H353221 Exudative age-related macular degeneration, left eye, with active choroidal neovascularization: Secondary | ICD-10-CM | POA: Diagnosis not present

## 2022-07-08 DIAGNOSIS — H2512 Age-related nuclear cataract, left eye: Secondary | ICD-10-CM | POA: Diagnosis not present

## 2022-07-30 DIAGNOSIS — M48062 Spinal stenosis, lumbar region with neurogenic claudication: Secondary | ICD-10-CM | POA: Diagnosis not present

## 2022-08-02 ENCOUNTER — Ambulatory Visit
Admission: EM | Admit: 2022-08-02 | Discharge: 2022-08-02 | Disposition: A | Discharge status: Home / Self Care | Payer: Medicare PPO | Attending: Emergency Medicine | Admitting: Emergency Medicine

## 2022-08-02 ENCOUNTER — Ambulatory Visit (INDEPENDENT_AMBULATORY_CARE_PROVIDER_SITE_OTHER): Payer: Medicare PPO

## 2022-08-02 DIAGNOSIS — Z7901 Long term (current) use of anticoagulants: Secondary | ICD-10-CM | POA: Diagnosis not present

## 2022-08-02 DIAGNOSIS — Z86718 Personal history of other venous thrombosis and embolism: Secondary | ICD-10-CM | POA: Diagnosis not present

## 2022-08-02 DIAGNOSIS — S52611A Displaced fracture of right ulna styloid process, initial encounter for closed fracture: Secondary | ICD-10-CM | POA: Diagnosis not present

## 2022-08-02 DIAGNOSIS — S63004A Unspecified dislocation of right wrist and hand, initial encounter: Secondary | ICD-10-CM

## 2022-08-02 DIAGNOSIS — S52501D Unspecified fracture of the lower end of right radius, subsequent encounter for closed fracture with routine healing: Secondary | ICD-10-CM | POA: Diagnosis not present

## 2022-08-02 DIAGNOSIS — S4991XA Unspecified injury of right shoulder and upper arm, initial encounter: Secondary | ICD-10-CM | POA: Diagnosis not present

## 2022-08-02 DIAGNOSIS — G629 Polyneuropathy, unspecified: Secondary | ICD-10-CM | POA: Diagnosis not present

## 2022-08-02 DIAGNOSIS — S52501A Unspecified fracture of the lower end of right radius, initial encounter for closed fracture: Secondary | ICD-10-CM | POA: Diagnosis not present

## 2022-08-02 DIAGNOSIS — M79621 Pain in right upper arm: Secondary | ICD-10-CM | POA: Diagnosis not present

## 2022-08-02 DIAGNOSIS — Z86711 Personal history of pulmonary embolism: Secondary | ICD-10-CM | POA: Diagnosis not present

## 2022-08-02 DIAGNOSIS — S52591A Other fractures of lower end of right radius, initial encounter for closed fracture: Secondary | ICD-10-CM | POA: Diagnosis not present

## 2022-08-02 DIAGNOSIS — I10 Essential (primary) hypertension: Secondary | ICD-10-CM | POA: Diagnosis not present

## 2022-08-02 DIAGNOSIS — M19011 Primary osteoarthritis, right shoulder: Secondary | ICD-10-CM | POA: Diagnosis not present

## 2022-08-02 DIAGNOSIS — S52101A Unspecified fracture of upper end of right radius, initial encounter for closed fracture: Secondary | ICD-10-CM | POA: Diagnosis not present

## 2022-08-02 DIAGNOSIS — E785 Hyperlipidemia, unspecified: Secondary | ICD-10-CM | POA: Diagnosis not present

## 2022-08-02 DIAGNOSIS — S6991XA Unspecified injury of right wrist, hand and finger(s), initial encounter: Secondary | ICD-10-CM | POA: Diagnosis not present

## 2022-08-02 DIAGNOSIS — Z043 Encounter for examination and observation following other accident: Secondary | ICD-10-CM | POA: Diagnosis not present

## 2022-08-02 DIAGNOSIS — S52611D Displaced fracture of right ulna styloid process, subsequent encounter for closed fracture with routine healing: Secondary | ICD-10-CM | POA: Diagnosis not present

## 2022-08-02 NOTE — ED Triage Notes (Signed)
Pt c/o fall this morning. Pt woke up and tried to get water and tripped. Pt fell on right arm.  Pt has bruising along right wrist and states that she has pain in her forearm, wrist, and the back of her right arm.

## 2022-08-02 NOTE — Discharge Instructions (Signed)
X-ray results: Fracture dislocation of the right wrist with transverse fracture of the distal radial metaphysis and complete dorsal dislocation of the distal radius and carpal bones relative to the rest of the radius and ulna. Associated avulsion of the ulnar styloid.  Consulted with Dr. Hyacinth Meeker who was the on-call orthopedic specialist, recommended that you go to [facility] emergency department so that bones may be reset into place and for further management, they will also give you pain medication during this time to make you more comfortable [facility] emergency department

## 2022-08-02 NOTE — ED Provider Notes (Signed)
[facility]    CSN: [id number] Arrival date & time: 08/02/22  8295      History   Chief Complaint Chief Complaint  Patient presents with   Fall    HPI Jamie Phillips is a 82 y.o. female.   Patient presents for evaluation of right arm pain beginning today after fall.  Endorses she was walking to the kitchen when she tripped landing directly onto the right arm.  Denies hitting head or loss of consciousness.  Now has pain, swelling throughout the entirety of the right arm.  Has some bruising to the forearm that she endorses was there prior due to use of blood thinner, taking Eliquis due to history of DVT.  Unable to complete any range of motion.  Denies numbness or tingling.  Has taken Tylenol this morning.  Past Medical History:  Diagnosis Date   Anginal pain    atypical chest pain   Anxiety    Arthritis    Back pain    Cancer    cll   Chronic kidney disease    Chronic kidney disease    CLL (chronic lymphocytic leukemia)    Depression    Epigastric pain    GERD (gastroesophageal reflux disease)    Headache    Hemorrhoids    Hypercholesteremia    Hyperlipidemia    Hypertension    Low grade B cell lymphoproliferative disorder    Macular degeneration    MGUS (monoclonal gammopathy of unknown significance)    Morbid obesity    Obesity    Pulmonary embolism    PVD (peripheral vascular disease)    Sleep apnea     Patient Active Problem List   Diagnosis Date Noted   Muscle spasms of both lower extremities 03/11/2022   Abnormality of gait 11/16/2021   Lumbar stenosis with neurogenic claudication 07/09/2021   Abnormal MRI, hip (Right) (12/23/2020) 06/12/2021   Chronic hip pain (Right) 06/05/2021   Primary osteoarthritis of hip (Right) 06/05/2021   Hypocalcemia 12/12/2020   Sacroiliac joint dysfunction of right side 12/12/2020   Chronic right sacroiliac joint pain 12/12/2020   Osteoarthritis of right sacroiliac joint 12/12/2020   Chronic hip pain  (Bilateral) 11/15/2020   Trochanteric bursitis of hips (Bilateral) 11/15/2020   Iliopsoas bursitis of hips 11/15/2020   Decreased range of motion of lumbar spine 11/15/2020   Osteoarthritis of hips (Bilateral) 11/15/2020   Abnormal CT scan, lumbar spine (06/16/2020) 09/20/2020   Abnormal EMG (electromyogram) (05/09/2020) 09/07/2020   Spondylosis without myelopathy or radiculopathy, lumbosacral region 08/15/2020   Chronic low back pain (2ry area of Pain) (Bilateral) w/o sciatica 08/15/2020   Chronic use of opiate for therapeutic purpose 07/27/2020   Vitamin D deficiency 07/27/2020   Chronic lower extremity radicular pain (L1/L2 dermatomes) (Bilateral) 06/21/2020   Chronic groin pain (Bilateral) 06/21/2020   Neurogenic pain, lower extremity (Bilateral) 06/21/2020   Shooting pain, intermittent (electrical-like) 06/21/2020   History of total knee replacement (Bilateral)  06/21/2020   Chronic low back pain (Bilateral) w/ sciatica (Bilateral) 04/24/2020   Lumbosacral radiculopathy at L1 (Bilateral) 04/24/2020   Lumbosacral radiculopathy at L5 (Left) 04/24/2020   Lumbosacral radiculopathy at S1 (Right) 04/24/2020   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) 04/24/2020   Abnormal MRI, lumbar spine (01/24/2020) 04/24/2020   Lumbar facet syndrome (Multilevel) (Bilateral) 04/24/2020   Lumbar facet hypertrophy (Multilevel) (Bilateral) 04/24/2020   Severe muscle deconditioning 04/24/2020   Lower extremity weakness (Bilateral) 04/24/2020   Lumbosacral facet hypertrophy (Multilevel) (Bilateral) 04/24/2020  Lumbar scoliosis (idiopathic) 04/24/2020   Spondylolisthesis of lumbosacral region (Multilevel) 04/24/2020   Retrolisthesis (L1/L2) 04/24/2020   Anterolisthesis of lumbar spine (L3/L4, L4/L5) 04/24/2020   Anterolisthesis of lumbosacral spine (L5/S1) 04/24/2020   Lumbar lateral recess stenosis (Bilateral: L3-4, L4-5) (Severe) 04/24/2020   Lumbar foraminal stenosis (Right:  L4-5) 04/24/2020   Synovial cyst of lumbar facet joint (L3-4) (Right) 04/24/2020   Arthropathy of spinal facet joint concurrent with and due to effusion (L3-4) 04/24/2020   Annular tear of lumbar disc (L3-4) (Left) 04/24/2020   Adnexal cyst 04/23/2020   Chronic pain syndrome 04/23/2020   Pharmacologic therapy 04/23/2020   Disorder of skeletal system 04/23/2020   Problems influencing health status 04/23/2020   Chronic anticoagulation (Eliquis) 03/06/2020   Other fatigue 03/07/2019   Monoclonal gammopathy 03/04/2019   COVID-19 12/14/2018   Walker as ambulation aid 11/04/2018   Right lower quadrant pain 10/09/2018   DDD (degenerative disc disease), lumbosacral 03/27/2018   RLS (restless legs syndrome) 10/07/2017   Morbid obesity with BMI of 40.0-44.9, adult 01/30/2017   Calculus of gallbladder without cholecystitis without obstruction 08/27/2016   Healthcare maintenance 08/06/2016   History of DVT in adulthood 03/04/2016   DJD (degenerative joint disease) 03/04/2016   Chronic lower extremity pain (1ry area of Pain) (Bilateral) 03/04/2016   Venous insufficiency 03/04/2016   Pes anserine bursitis 01/30/2016   Chronic pulmonary embolism 08/25/2015   Acute respiratory failure 08/14/2015   Acute bronchitis 05/11/2015   Essential hypertension 05/04/2015   Hyperlipidemia 05/04/2015   Pain in the chest 05/04/2015   SOB (shortness of breath) 05/04/2015   Hyperglycemia, unspecified 10/13/2014   Prediabetes 10/13/2014   Depression, major, in remission 02/06/2014   CLL (chronic lymphocytic leukemia) 01/23/2014   Generalized OA 01/23/2014   GERD without esophagitis 01/23/2014   History of Bell's palsy 01/23/2014   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 01/23/2014   OSA on CPAP 01/23/2014   Pure hypercholesterolemia 01/23/2014    Past Surgical History:  Procedure Laterality Date   APPENDECTOMY     BREAST BIOPSY Left 08/19/2004   lt bx/clip-neg   COLONOSCOPY     COLONOSCOPY  WITH PROPOFOL N/A 03/10/2017   Procedure: COLONOSCOPY WITH PROPOFOL;  Surgeon: Christena Deem, MD;  Location: [facility] ENDOSCOPY;  Service: Endoscopy;  Laterality: N/A;   ESOPHAGOGASTRODUODENOSCOPY (EGD) WITH PROPOFOL N/A 02/02/2019   Procedure: ESOPHAGOGASTRODUODENOSCOPY (EGD) WITH PROPOFOL;  Surgeon: Toledo, Boykin Nearing, MD;  Location: [facility] ENDOSCOPY;  Service: Endoscopy;  Laterality: N/A;   IVC FILTER INSERTION N/A 06/11/2016   Procedure: IVC Filter Insertion;  Surgeon: Renford Dills, MD;  Location: [facility] INVASIVE CV LAB;  Service: Cardiovascular;  Laterality: N/A;   IVC FILTER REMOVAL N/A 05/06/2017   Procedure: IVC FILTER REMOVAL;  Surgeon: Renford Dills, MD;  Location: [facility] INVASIVE CV LAB;  Service: Cardiovascular;  Laterality: N/A;   JOINT REPLACEMENT Left 06/17/2016   Knees, pt stated left and right have been replaced.   LAPAROSCOPIC APPENDECTOMY N/A 10/10/2018   Procedure: APPENDECTOMY LAPAROSCOPIC ATTEMPTED CONVERTED TO OPEN;  Surgeon: Carolan Shiver, MD;  Location: [facility] ORS;  Service: General;  Laterality: N/A;   LAPAROSCOPY N/A 10/10/2018   Procedure: LAPAROSCOPY DIAGNOSTIC ATTEMPTED CONVERTED TO OPEN;  Surgeon: Carolan Shiver, MD;  Location: [facility] ORS;  Service: General;  Laterality: N/A;   LUMBAR LAMINECTOMY/DECOMPRESSION MICRODISCECTOMY N/A 07/09/2021   Procedure: Laminectomy and Foraminotomy - Lumbar Two-Lumbar Three - Lumbar Three-Lumbar Four - Lumbar Four-Lumbar Five;  Surgeon: Julio Sicks, MD;  Location: [facility] OR;  Service: Neurosurgery;  Laterality: N/A;   neck fusion     PARTIAL COLECTOMY Right 10/10/2018   Procedure: PARTIAL COLECTOMY;  Surgeon: Carolan Shiver, MD;  Location: [facility] ORS;  Service: General;  Laterality: Right;   PERIPHERAL VASCULAR CATHETERIZATION N/A 08/15/2015   Procedure: pulmonary angiogram with lysis;  Surgeon: Renford Dills, MD;  Location[facility] INVASIVE CV LAB;  Service: Cardiovascular;  Laterality: N/A;   WRIST SURGERY Left  2000    OB History     Gravida  4   Para  4   Term  0   Preterm  0   AB  0   Living         SAB  0   IAB  0   Ectopic  0   Multiple      Live Births               Home Medications    Prior to Admission medications   Medication Sig Start Date End Date Taking? Authorizing Provider  acetaminophen (TYLENOL) 650 MG CR tablet Take 650-1,300 mg by mouth every 8 (eight) hours as needed for pain. TYLENOL ARTHRITIS   Yes [provider]  apixaban (ELIQUIS) 5 MG TABS tablet Take 1 tablet (5 mg total) by mouth 2 (two) times daily. 09/28/21  Yes Earna Coder, MD  calcium carbonate (OSCAL) 1500 (600 Ca) MG TABS tablet Take 1,500 mg by mouth 2 (two) times daily with a meal.   Yes [provider]  DULoxetine (CYMBALTA) 60 MG capsule Take 1 capsule (60 mg total) by mouth daily. 03/11/22  Yes Lovorn, Aundra Millet, MD  escitalopram (LEXAPRO) 20 MG tablet Take 1 tablet by mouth daily. 05/20/22  Yes [provider]  gabapentin (NEURONTIN) 400 MG capsule Take 1 capsule (400 mg total) by mouth 3 (three) times daily. Take 1 pill in Am and 2 pills at night- for nerve pain 03/11/22  Yes Lovorn, Aundra Millet, MD  HYDROcodone-acetaminophen (NORCO) 5-325 MG tablet Take 1 tablet by mouth every 6 (six) hours as needed for moderate pain. 05/11/22  Yes Lovorn, Aundra Millet, MD  losartan-hydrochlorothiazide (HYZAAR) 100-12.5 MG tablet Take 0.5 tablets by mouth daily. Reported on 08/22/2015 06/18/21  Yes Langston Reusing, MD  magnesium gluconate (MAGONATE) 500 MG tablet Take 500 mg by mouth 2 (two) times daily.   Yes [provider]  methocarbamol (ROBAXIN) 500 MG tablet TAKE 1 TABLET (500 MG TOTAL) BY MOUTH EVERY 8 (EIGHT) HOURS AS NEEDED FOR MUSCLE SPASMS. 06/24/22  Yes Lovorn, Megan, MD  pantoprazole (PROTONIX) 40 MG tablet Take 40 mg by mouth daily.   Yes [provider]  pravastatin (PRAVACHOL) 40 MG tablet Take 1 tablet by mouth daily. 06/27/21  Yes [provider]  propranolol (INDERAL) 40 MG tablet Take 40 mg by mouth daily. 09/28/20  Yes [provider]  sucralfate (CARAFATE) 1 g tablet Take 1 g by mouth daily.   Yes [provider]  b complex vitamins capsule Take 1 capsule by mouth daily.    [provider]  cholestyramine (QUESTRAN) 4 g packet Take 1 packet (4 g total) by mouth 3 (three) times daily with meals. Patient taking differently: Take 4 g by mouth 3 (three) times daily as needed. 04/30/21   Langston Reusing, MD  escitalopram (LEXAPRO) 10 MG tablet Take 10 mg by mouth daily.    [provider]  lidocaine (LIDODERM) 5 % Place 1 patch onto the skin daily. 12 hors on; 12 hrs off- 8am to 8pm-  apply to R low back. 11/16/21   Lovorn, Aundra Millet, MD  Multiple Vitamins-Minerals (PRESERVISION AREDS 2 PO) Take 1 tablet by mouth in the morning and at bedtime.    [provider]  TURMERIC CURCUMIN PO Take 1 capsule by mouth in the morning and at bedtime.    [provider]    Family History Family History  Problem Relation Age of Onset   Pulmonary embolism Mother    Sudden death Mother    Obesity Mother    Heart failure Father    Congestive Heart Failure Father    Sudden death Father    Thyroid cancer Brother    Breast cancer Neg Hx     Social History Social History   Tobacco Use   Smoking status: Never   Smokeless tobacco: Never  Vaping Use   Vaping Use: Never used  Substance Use Topics   Alcohol use: No    Alcohol/week: 0.0 standard drinks of alcohol   Drug use: No     Allergies   Patient has no known allergies.   Review of Systems Review of Systems   Physical Exam Triage Vital Signs ED Triage Vitals  Enc Vitals Group     BP 08/02/22 0858 114/72     Pulse Rate 08/02/22 0858 87     Resp --      Temp --      Temp src --      SpO2 08/02/22 0858 97 %     Weight 08/02/22 0854 240 lb (108.9 kg)     Height 08/02/22 0854  (1.626 m)     Head Circumference  --      Peak Flow --      Pain Score 08/02/22 0854 10     Pain Loc --      Pain Edu? --      Excl. in GC? --    No data found.  Updated Vital Signs BP 114/72 (BP Location: Left Arm)   Pulse 87   Ht  (1.626 m)   Wt 240 lb (108.9 kg)   SpO2 97%   BMI 41.20 kg/m   Visual Acuity Right Eye Distance:   Left Eye Distance:   Bilateral Distance:    Right Eye Near:   Left Eye Near:    Bilateral Near:     Physical Exam Constitutional:      Appearance: Normal appearance.  Eyes:     Extraocular Movements: Extraocular movements intact.  Pulmonary:     Effort: Pulmonary effort is normal.  Musculoskeletal:     Comments: Severe swelling and deformity present to the right wrist and forearm, unable to complete range of motion, sensation is intact, 2+ radial pulse, unable to assess strength  Neurological:     Mental Status: She is alert and oriented to person, place, and time.      UC Treatments / Results  Labs (all labs ordered are listed, but only abnormal results are displayed) Labs Reviewed - No data to display  EKG   Radiology DG Forearm Right  Result Date: 08/02/2022 CLINICAL DATA:  Fracture dislocation of the right wrist. EXAM: RIGHT FOREARM - 2 VIEW COMPARISON:  Right wrist films today. FINDINGS: Other than the previously described fracture dislocation at the right wrist with transverse fracture of the distal radial metaphysis and ulnar styloid avulsion, no additional radial or ulnar are fracture identified proximally. Alignment at the elbow is normal. IMPRESSION: Fracture dislocation of the right wrist with transverse fracture  of the distal radial metaphysis and ulnar styloid avulsion. Electronically Signed   By: Irish Lack M.D.   On: 08/02/2022 09:40   DG Wrist Complete Right  Result Date: 08/02/2022 CLINICAL DATA:  Fall with right wrist injury. EXAM: RIGHT WRIST - COMPLETE 3+ VIEW COMPARISON:  None Available. FINDINGS: Transverse fracture through the distal  right radial metaphysis with complete dorsal dislocation of the distal radius and carpal bones relative to the rest of the radius and the ulna. Associated avulsion of the ulnar styloid. IMPRESSION: Fracture dislocation of the right wrist with transverse fracture of the distal radial metaphysis and complete dorsal dislocation of the distal radius and carpal bones relative to the rest of the radius and ulna. Associated avulsion of the ulnar styloid. Electronically Signed   By: Irish Lack M.D.   On: 08/02/2022 09:35   DG Humerus Right  Result Date: 08/02/2022 CLINICAL DATA:  Fall. EXAM: RIGHT HUMERUS - 2+ VIEW COMPARISON:  None Available. FINDINGS: No sign of acute fracture or dislocation. Severe osteoarthritis is noted involving the acromioclavicular joint. Mild degenerative changes at the glenohumeral joint. Calcification within the acromial humeral interval may reflect underlying rotator cuff calcific tendinopathy. IMPRESSION: 1. No acute findings. 2. Severe acromioclavicular joint osteoarthritis. 3. Suspect rotator cuff calcific tendinopathy. Electronically Signed   By: Signa Kell M.D.   On: 08/02/2022 09:29    Procedures Procedures (including critical care time)  Medications Ordered in UC Medications - No data to display  Initial Impression / Assessment and Plan / UC Course  I have reviewed the triage vital signs and the nursing notes.  Pertinent labs & imaging results that were available during my care of the patient were reviewed by me and considered in my medical decision making (see chart for details).  Dislocation of the distal radius  X-ray confirming complete dislocation as well as fracture to the radius and the ulnar, discussed findings with patient and son, notified on-call orthopedist who recommended patient going to [facility] for further management, to be escorted by family,  Final Clinical Impressions(s) / UC Diagnoses   Final diagnoses:  None   Discharge Instructions    None    ED Prescriptions   None    PDMP not reviewed this encounter.   Valinda Hoar, NP 08/02/22 1036

## 2022-08-06 DIAGNOSIS — S52501A Unspecified fracture of the lower end of right radius, initial encounter for closed fracture: Secondary | ICD-10-CM | POA: Diagnosis not present

## 2022-08-12 DIAGNOSIS — S52501A Unspecified fracture of the lower end of right radius, initial encounter for closed fracture: Secondary | ICD-10-CM | POA: Diagnosis not present

## 2022-08-12 DIAGNOSIS — G8918 Other acute postprocedural pain: Secondary | ICD-10-CM | POA: Diagnosis not present

## 2022-08-21 DIAGNOSIS — H2512 Age-related nuclear cataract, left eye: Secondary | ICD-10-CM | POA: Diagnosis not present

## 2022-08-21 DIAGNOSIS — Z0100 Encounter for examination of eyes and vision without abnormal findings: Secondary | ICD-10-CM | POA: Diagnosis not present

## 2022-08-27 DIAGNOSIS — Z9889 Other specified postprocedural states: Secondary | ICD-10-CM | POA: Diagnosis not present

## 2022-08-27 DIAGNOSIS — S52501D Unspecified fracture of the lower end of right radius, subsequent encounter for closed fracture with routine healing: Secondary | ICD-10-CM | POA: Diagnosis not present

## 2022-08-27 DIAGNOSIS — S52611D Displaced fracture of right ulna styloid process, subsequent encounter for closed fracture with routine healing: Secondary | ICD-10-CM | POA: Diagnosis not present

## 2022-08-27 DIAGNOSIS — S52501A Unspecified fracture of the lower end of right radius, initial encounter for closed fracture: Secondary | ICD-10-CM | POA: Diagnosis not present

## 2022-09-04 ENCOUNTER — Other Ambulatory Visit: Payer: Self-pay | Admitting: Physical Medicine and Rehabilitation

## 2022-09-04 DIAGNOSIS — I2692 Saddle embolus of pulmonary artery without acute cor pulmonale: Secondary | ICD-10-CM | POA: Diagnosis not present

## 2022-09-04 DIAGNOSIS — C9110 Chronic lymphocytic leukemia of B-cell type not having achieved remission: Secondary | ICD-10-CM | POA: Diagnosis not present

## 2022-09-04 DIAGNOSIS — G4733 Obstructive sleep apnea (adult) (pediatric): Secondary | ICD-10-CM | POA: Diagnosis not present

## 2022-09-04 DIAGNOSIS — Z0000 Encounter for general adult medical examination without abnormal findings: Secondary | ICD-10-CM | POA: Diagnosis not present

## 2022-09-04 DIAGNOSIS — E7800 Pure hypercholesterolemia, unspecified: Secondary | ICD-10-CM | POA: Diagnosis not present

## 2022-09-04 DIAGNOSIS — F325 Major depressive disorder, single episode, in full remission: Secondary | ICD-10-CM | POA: Diagnosis not present

## 2022-09-04 DIAGNOSIS — I129 Hypertensive chronic kidney disease with stage 1 through stage 4 chronic kidney disease, or unspecified chronic kidney disease: Secondary | ICD-10-CM | POA: Diagnosis not present

## 2022-09-04 DIAGNOSIS — H2512 Age-related nuclear cataract, left eye: Secondary | ICD-10-CM | POA: Diagnosis not present

## 2022-09-04 DIAGNOSIS — R7303 Prediabetes: Secondary | ICD-10-CM | POA: Diagnosis not present

## 2022-09-05 ENCOUNTER — Encounter: Payer: Self-pay | Admitting: Ophthalmology

## 2022-09-05 NOTE — Anesthesia Preprocedure Evaluation (Addendum)
Anesthesia Evaluation  Patient identified by MRN, date of birth, ID band Patient awake    Reviewed: Allergy & Precautions, H&P , NPO status , Patient's Chart, lab work & pertinent test results  Airway Mallampati: III  TM Distance: <3 FB Neck ROM: Full    Dental no notable dental hx.    Pulmonary sleep apnea  Unable to use CPAP since had spine surgery; discussed risks untreated sleep apnea, urged patient to see physician who ordered CPAP and explore possible alternatives to CPAP, including, but not limited to, "inspire"   Pulmonary exam normal breath sounds clear to auscultation       Cardiovascular hypertension, + angina  + Peripheral Vascular Disease  Normal cardiovascular exam Rhythm:Regular Rate:Normal     Neuro/Psych  Headaches PSYCHIATRIC DISORDERS Anxiety Depression     Neuromuscular disease  negative psych ROS   GI/Hepatic Neg liver ROS,GERD  ,,  Endo/Other  negative endocrine ROS    Renal/GU Renal disease  negative genitourinary   Musculoskeletal negative musculoskeletal ROS (+) Arthritis ,    Abdominal   Peds negative pediatric ROS (+)  Hematology negative hematology ROS (+)   Anesthesia Other Findings Hypertension  GERD (gastroesophageal reflux disease) Hypercholesteremia  MGUS (monoclonal gammopathy of unknown significance) Hyperlipidemia  Arthritis Hemorrhoids  Depression Obesity  Anxiety Back pain  Low grade B cell lymphoproliferative disorder  Pulmonary embolism (HCC)  Anginal pain (HCC) Chronic kidney disease  Headache Macular degeneration  PVD (peripheral vascular disease) (HCC) Sleep apnea  Epigastric pain Chronic kidney disease  Morbid obesity (HCC) CLL (chronic lymphocytic leukemia) (HCC) Chronic pain syndrome Spinal stenosois     Reproductive/Obstetrics negative OB ROS                             Anesthesia Physical Anesthesia Plan  ASA:  3  Anesthesia Plan: MAC   Post-op Pain Management:    Induction: Intravenous  PONV Risk Score and Plan:   Airway Management Planned: Natural Airway and Nasal Cannula  Additional Equipment:   Intra-op Plan:   Post-operative Plan:   Informed Consent: I have reviewed the patients History and Physical, chart, labs and discussed the procedure including the risks, benefits and alternatives for the proposed anesthesia with the patient or authorized representative who has indicated his/her understanding and acceptance.     Dental Advisory Given  Plan Discussed with: Anesthesiologist, CRNA and Surgeon  Anesthesia Plan Comments: (Patient consented for risks of anesthesia including but not limited to:  - adverse reactions to medications - damage to eyes, teeth, lips or other oral mucosa - nerve damage due to positioning  - sore throat or hoarseness - Damage to heart, brain, nerves, lungs, other parts of body or loss of life  Patient voiced understanding.)       Anesthesia Quick Evaluation

## 2022-09-06 NOTE — Discharge Instructions (Signed)

## 2022-09-10 ENCOUNTER — Ambulatory Visit: Payer: Medicare PPO | Admitting: Anesthesiology

## 2022-09-10 ENCOUNTER — Encounter
Admission: RE | Disposition: A | Discharge status: Home / Self Care | Payer: Self-pay | Source: Home / Self Care | Attending: Ophthalmology

## 2022-09-10 ENCOUNTER — Encounter: Payer: Self-pay | Admitting: Ophthalmology

## 2022-09-10 ENCOUNTER — Other Ambulatory Visit: Payer: Self-pay

## 2022-09-10 ENCOUNTER — Ambulatory Visit
Admission: RE | Admit: 2022-09-10 | Discharge: 2022-09-10 | Disposition: A | Discharge status: Home / Self Care | Payer: Medicare PPO | Attending: Ophthalmology | Admitting: Ophthalmology

## 2022-09-10 DIAGNOSIS — G4730 Sleep apnea, unspecified: Secondary | ICD-10-CM | POA: Insufficient documentation

## 2022-09-10 DIAGNOSIS — E7800 Pure hypercholesterolemia, unspecified: Secondary | ICD-10-CM | POA: Diagnosis not present

## 2022-09-10 DIAGNOSIS — N183 Chronic kidney disease, stage 3 unspecified: Secondary | ICD-10-CM | POA: Diagnosis not present

## 2022-09-10 DIAGNOSIS — Z86711 Personal history of pulmonary embolism: Secondary | ICD-10-CM | POA: Insufficient documentation

## 2022-09-10 DIAGNOSIS — I129 Hypertensive chronic kidney disease with stage 1 through stage 4 chronic kidney disease, or unspecified chronic kidney disease: Secondary | ICD-10-CM | POA: Diagnosis not present

## 2022-09-10 DIAGNOSIS — Z856 Personal history of leukemia: Secondary | ICD-10-CM | POA: Diagnosis not present

## 2022-09-10 DIAGNOSIS — K219 Gastro-esophageal reflux disease without esophagitis: Secondary | ICD-10-CM | POA: Insufficient documentation

## 2022-09-10 DIAGNOSIS — N189 Chronic kidney disease, unspecified: Secondary | ICD-10-CM | POA: Diagnosis not present

## 2022-09-10 DIAGNOSIS — F419 Anxiety disorder, unspecified: Secondary | ICD-10-CM | POA: Insufficient documentation

## 2022-09-10 DIAGNOSIS — M1990 Unspecified osteoarthritis, unspecified site: Secondary | ICD-10-CM | POA: Diagnosis not present

## 2022-09-10 DIAGNOSIS — F32A Depression, unspecified: Secondary | ICD-10-CM | POA: Insufficient documentation

## 2022-09-10 DIAGNOSIS — I739 Peripheral vascular disease, unspecified: Secondary | ICD-10-CM | POA: Diagnosis not present

## 2022-09-10 DIAGNOSIS — H2512 Age-related nuclear cataract, left eye: Secondary | ICD-10-CM | POA: Diagnosis not present

## 2022-09-10 DIAGNOSIS — Z6841 Body Mass Index (BMI) 40.0 and over, adult: Secondary | ICD-10-CM | POA: Insufficient documentation

## 2022-09-10 HISTORY — PX: CATARACT EXTRACTION W/PHACO: SHX586

## 2022-09-10 SURGERY — PHACOEMULSIFICATION, CATARACT, WITH IOL INSERTION
Anesthesia: Monitor Anesthesia Care | Site: Eye | Laterality: Left

## 2022-09-10 MED ORDER — SIGHTPATH DOSE#1 BSS IO SOLN
INTRAOCULAR | Status: DC | PRN
  Administered 2022-09-10: 1 mL via INTRAMUSCULAR

## 2022-09-10 MED ORDER — BRIMONIDINE TARTRATE-TIMOLOL 0.2-0.5 % OP SOLN
OPHTHALMIC | Status: DC | PRN
  Administered 2022-09-10: 1 [drp] via OPHTHALMIC

## 2022-09-10 MED ORDER — MOXIFLOXACIN HCL 0.5 % OP SOLN
OPHTHALMIC | Status: DC | PRN
  Administered 2022-09-10: .2 mL via OPHTHALMIC

## 2022-09-10 MED ORDER — MIDAZOLAM HCL 2 MG/2ML IJ SOLN
INTRAMUSCULAR | Status: DC | PRN
  Administered 2022-09-10: 2 mg via INTRAVENOUS

## 2022-09-10 MED ORDER — TETRACAINE HCL 0.5 % OP SOLN
1.0000 [drp] | OPHTHALMIC | Status: DC | PRN
  Administered 2022-09-10 (×3): 1 [drp] via OPHTHALMIC

## 2022-09-10 MED ORDER — SIGHTPATH DOSE#1 BSS IO SOLN
INTRAOCULAR | Status: DC | PRN
  Administered 2022-09-10: 50 mL via OPHTHALMIC

## 2022-09-10 MED ORDER — LACTATED RINGERS IV SOLN: INTRAVENOUS | Status: DC

## 2022-09-10 MED ORDER — ARMC OPHTHALMIC DILATING DROPS
1.0000 | OPHTHALMIC | Status: DC | PRN
  Administered 2022-09-10 (×3): 1 via OPHTHALMIC

## 2022-09-10 MED ORDER — SIGHTPATH DOSE#1 BSS IO SOLN
INTRAOCULAR | Status: DC | PRN
  Administered 2022-09-10: 15 mL

## 2022-09-10 MED ORDER — SIGHTPATH DOSE#1 NA CHONDROIT SULF-NA HYALURON 40-17 MG/ML IO SOLN
INTRAOCULAR | Status: DC | PRN
  Administered 2022-09-10: 1 mL via INTRAOCULAR

## 2022-09-10 SURGICAL SUPPLY — 12 items
ANGLE REVERSE CUT SHRT 25GA (CUTTER) ×1
CATARACT SUITE SIGHTPATH (MISCELLANEOUS) ×1 IMPLANT
CYSTOTOME ANGL RVRS SHRT 25G (CUTTER) ×2 IMPLANT
CYSTOTOME ANGL RVRS SHRT 25GA (CUTTER) ×1 IMPLANT
FEE CATARACT SUITE SIGHTPATH (MISCELLANEOUS) ×2 IMPLANT
GLOVE BIOGEL PI IND STRL 8 (GLOVE) ×2 IMPLANT
GLOVE SURG ENC TEXT LTX SZ8 (GLOVE) ×2 IMPLANT
LENS IOL CLRN 22.0 (Intraocular Lens) IMPLANT
LENS IOL MONO CLAREON 22.0 (Intraocular Lens) ×1 IMPLANT
NDL FILTER BLUNT 18X1 1/2 (NEEDLE) ×2 IMPLANT
NEEDLE FILTER BLUNT 18X1 1/2 (NEEDLE) ×1 IMPLANT
SYR 3ML LL SCALE MARK (SYRINGE) ×2 IMPLANT

## 2022-09-10 NOTE — Transfer of Care (Signed)
Immediate Anesthesia Transfer of Care Note  Patient: Jamie Phillips  Procedure(s) Performed: CATARACT EXTRACTION PHACO AND INTRAOCULAR LENS PLACEMENT (IOC) LEFT  5.88  00:31.9 (Left: Eye)  Patient Location: PACU  Anesthesia Type: MAC  Level of Consciousness: awake, alert  and patient cooperative  Airway and Oxygen Therapy: Patient Spontanous Breathing and Patient connected to supplemental oxygen  Post-op Assessment: Post-op Vital signs reviewed, Patient's Cardiovascular Status Stable, Respiratory Function Stable, Patent Airway and No signs of Nausea or vomiting  Post-op Vital Signs: Reviewed and stable  Complications: No notable events documented.

## 2022-09-10 NOTE — Anesthesia Postprocedure Evaluation (Signed)
Anesthesia Post Note  Patient: Jamie Phillips  Procedure(s) Performed: CATARACT EXTRACTION PHACO AND INTRAOCULAR LENS PLACEMENT (IOC) LEFT  5.88  00:31.9 (Left: Eye)  Patient location during evaluation: PACU Anesthesia Type: MAC Level of consciousness: awake and alert Pain management: pain level controlled Vital Signs Assessment: post-procedure vital signs reviewed and stable Respiratory status: spontaneous breathing, nonlabored ventilation, respiratory function stable and patient connected to nasal cannula oxygen Cardiovascular status: stable and blood pressure returned to baseline Postop Assessment: no apparent nausea or vomiting Anesthetic complications: no   No notable events documented.   Last Vitals:  Vitals:   09/10/22 0937 09/10/22 0942  BP: 111/71 98/61  Pulse: (!) 59 (!) 58  Resp: 12 17  Temp: (!) 36.3 C (!) 36.3 C  SpO2: 97% 94%    Last Pain:  Vitals:   09/10/22 0942  TempSrc:   PainSc: 0-No pain                 Marisue Humble

## 2022-09-10 NOTE — Op Note (Signed)
PREOPERATIVE DIAGNOSIS:  Nuclear sclerotic cataract of the left eye.   POSTOPERATIVE DIAGNOSIS:  Nuclear sclerotic cataract of the left eye.   OPERATIVE PROCEDURE:ORPROCALL@   SURGEON:  Galen Manila, MD.   ANESTHESIA:  Anesthesiologist: Marisue Humble, MD CRNA: Domenic Moras, CRNA  1.      Managed anesthesia care. 2.     0.62ml of Shugarcaine was instilled following the paracentesis   COMPLICATIONS:  None.   TECHNIQUE:   Stop and chop   DESCRIPTION OF PROCEDURE:  The patient was examined and consented in the preoperative holding area where the aforementioned topical anesthesia was applied to the left eye and then brought back to the Operating Room where the left eye was prepped and draped in the usual sterile ophthalmic fashion and a lid speculum was placed. A paracentesis was created with the side port blade and the anterior chamber was filled with viscoelastic. A near clear corneal incision was performed with the steel keratome. A continuous curvilinear capsulorrhexis was performed with a cystotome followed by the capsulorrhexis forceps. Hydrodissection and hydrodelineation were carried out with BSS on a blunt cannula. The lens was removed in a stop and chop  technique and the remaining cortical material was removed with the irrigation-aspiration handpiece. The capsular bag was inflated with viscoelastic and the Technis [id number] lens was placed in the capsular bag without complication. The remaining viscoelastic was removed from the eye with the irrigation-aspiration handpiece. The wounds were hydrated. The anterior chamber was flushed with BSS and the eye was inflated to physiologic pressure. 0.49ml Vigamox was placed in the anterior chamber. The wounds were found to be water tight. The eye was dressed with Combigan. The patient was given protective glasses to wear throughout the day and a shield with which to sleep tonight. The patient was also given drops with which to begin a drop  regimen today and will follow-up with me in one day. Implant Name Type Inv. Item Serial No. Manufacturer Lot No. LRB No. Used Action  LENS IOL MONO CLAREON 22.0 - [id number] Intraocular Lens LENS IOL MONO CLAREON 22.0 [id number] [employer]  Left 1 Implanted    Procedure(s) with comments: CATARACT EXTRACTION PHACO AND INTRAOCULAR LENS PLACEMENT (IOC) LEFT  5.88  00:31.9 (Left) - sleep apnea  Electronically signed: Galen Manila 09/10/2022 9:35 AM

## 2022-09-10 NOTE — H&P (Signed)
[facility]   Primary Care Physician:  Lauro Regulus, MD Ophthalmologist: Dr. Druscilla Brownie  Pre-Procedure History & Physical: HPI:  Jamie Phillips is a 82 y.o. female here for cataract surgery.   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)    Macular degeneration    MGUS (monoclonal gammopathy of unknown significance)    Morbid obesity (HCC)    Obesity    Pulmonary embolism (HCC)    PVD (peripheral vascular disease) (HCC)    Sleep apnea    CPAP    Past Surgical History:  Procedure Laterality Date   APPENDECTOMY     BREAST BIOPSY Left 08/19/2004   lt bx/clip-neg   COLONOSCOPY     COLONOSCOPY WITH PROPOFOL N/A 03/10/2017   Procedure: COLONOSCOPY WITH PROPOFOL;  Surgeon: Christena Deem, MD;  Location: [facility] ENDOSCOPY;  Service: Endoscopy;  Laterality: N/A;   ESOPHAGOGASTRODUODENOSCOPY (EGD) WITH PROPOFOL N/A 02/02/2019   Procedure: ESOPHAGOGASTRODUODENOSCOPY (EGD) WITH PROPOFOL;  Surgeon: Toledo, Boykin Nearing, MD;  Location: [facility] ENDOSCOPY;  Service: Endoscopy;  Laterality: N/A;   IVC FILTER INSERTION N/A 06/11/2016   Procedure: IVC Filter Insertion;  Surgeon: Renford Dills, MD;  Location: [facility] INVASIVE CV LAB;  Service: Cardiovascular;  Laterality: N/A;   IVC FILTER REMOVAL N/A 05/06/2017   Procedure: IVC FILTER REMOVAL;  Surgeon: Renford Dills, MD;  Location: [facility] INVASIVE CV LAB;  Service: Cardiovascular;  Laterality: N/A;   JOINT REPLACEMENT Left 06/17/2016   Knees, pt stated left and right have been replaced.   LAPAROSCOPIC APPENDECTOMY N/A  10/10/2018   Procedure: APPENDECTOMY LAPAROSCOPIC ATTEMPTED CONVERTED TO OPEN;  Surgeon: Carolan Shiver, MD;  Location: [facility] ORS;  Service: General;  Laterality: N/A;   LAPAROSCOPY N/A 10/10/2018   Procedure: LAPAROSCOPY DIAGNOSTIC ATTEMPTED CONVERTED TO OPEN;  Surgeon: Carolan Shiver, MD;  Location: [facility] ORS;  Service: General;  Laterality: N/A;   LUMBAR LAMINECTOMY/DECOMPRESSION MICRODISCECTOMY N/A 07/09/2021   Procedure: Laminectomy and Foraminotomy - Lumbar Two-Lumbar Three - Lumbar Three-Lumbar Four - Lumbar Four-Lumbar Five;  Surgeon: Julio Sicks, MD;  Location: [facility] OR;  Service: Neurosurgery;  Laterality: N/A;   neck fusion     PARTIAL COLECTOMY Right 10/10/2018   Procedure: PARTIAL COLECTOMY;  Surgeon: Carolan Shiver, MD;  Location: [facility] ORS;  Service: General;  Laterality: Right;   PERIPHERAL VASCULAR CATHETERIZATION N/A 08/15/2015   Procedure: pulmonary angiogram with lysis;  Surgeon: Renford Dills, MD;  Location: [facility] INVASIVE CV LAB;  Service: Cardiovascular;  Laterality: N/A;   WRIST SURGERY Left 2000    Prior to Admission medications   Medication Sig Start Date End Date Taking? Authorizing Provider  acetaminophen (TYLENOL) 650 MG CR tablet Take 650-1,300 mg by mouth every 8 (eight) hours as needed for pain. TYLENOL ARTHRITIS   Yes [provider]  apixaban (ELIQUIS) 5 MG TABS tablet Take 1 tablet (5 mg total) by mouth 2 (two) times daily. 09/28/21  Yes Earna Coder, MD  b  complex vitamins capsule Take 1 capsule by mouth daily.   Yes [provider]  calcium carbonate (OSCAL) 1500 (600 Ca) MG TABS tablet Take 1,500 mg by mouth 2 (two) times daily with a meal.   Yes [provider]  Cholecalciferol (VITAMIN D-3) 125 MCG (5000 UT) TABS Take by mouth daily.   Yes [provider]  cholestyramine (QUESTRAN) 4 g packet Take 1 packet (4 g total) by mouth 3 (three) times daily with meals. Patient taking differently: Take 4 g  by mouth 3 (three) times daily as needed. 04/30/21  Yes Langston Reusing, MD  DULoxetine (CYMBALTA) 60 MG capsule TAKE 1 CAPSULE (60 MG TOTAL) BY MOUTH DAILY. 09/04/22  Yes Lovorn, Aundra Millet, MD  escitalopram (LEXAPRO) 20 MG tablet Take 1 tablet by mouth daily. 05/20/22  Yes [provider]  gabapentin (NEURONTIN) 400 MG capsule Take 1 capsule (400 mg total) by mouth 3 (three) times daily. Take 1 pill in Am and 2 pills at night- for nerve pain 03/11/22  Yes Lovorn, Megan, MD  lidocaine (LIDODERM) 5 % Place 1 patch onto the skin daily. 12 hors on; 12 hrs off- 8am to 8pm- apply to R low back. 11/16/21  Yes Lovorn, Aundra Millet, MD  losartan-hydrochlorothiazide (HYZAAR) 100-12.5 MG tablet Take 0.5 tablets by mouth daily. Reported on 08/22/2015 06/18/21  Yes Langston Reusing, MD  magnesium gluconate (MAGONATE) 500 MG tablet Take 500 mg by mouth 2 (two) times daily.   Yes [provider]  methocarbamol (ROBAXIN) 500 MG tablet TAKE 1 TABLET (500 MG TOTAL) BY MOUTH EVERY 8 (EIGHT) HOURS AS NEEDED FOR MUSCLE SPASMS. 06/24/22  Yes Lovorn, Megan, MD  Multiple Vitamins-Minerals (PRESERVISION AREDS 2 PO) Take 1 tablet by mouth in the morning and at bedtime.   Yes [provider]  pantoprazole (PROTONIX) 40 MG tablet Take 40 mg by mouth daily.   Yes [provider]  pravastatin (PRAVACHOL) 40 MG tablet Take 1 tablet by mouth daily. 06/27/21  Yes [provider]  propranolol (INDERAL) 40 MG tablet Take 40 mg by mouth daily. 09/28/20  Yes [provider]  sucralfate (CARAFATE) 1 g tablet Take 1 g by mouth daily.   Yes [provider]  TURMERIC CURCUMIN PO Take 1 capsule by mouth in the morning and at bedtime.   Yes [provider]  HYDROcodone-acetaminophen (NORCO) 5-325 MG tablet Take 1 tablet by mouth every 6 (six) hours as needed for moderate pain. Patient not taking: Reported on 09/10/2022 05/11/22   Genice Rouge, MD    Allergies as of 08/23/2022   (No  Known Allergies)    Family History  Problem Relation Age of Onset   Pulmonary embolism Mother    Sudden death Mother    Obesity Mother    Heart failure Father    Congestive Heart Failure Father    Sudden death Father    Thyroid cancer Brother    Breast cancer Neg Hx     Social History   Socioeconomic History   Marital status: Widowed    Spouse name: Not on file   Number of children: Not on file   Years of education: Not on file   Highest education level: Not on file  Occupational History   Occupation: Retired  Tobacco Use   Smoking status: Never   Smokeless tobacco: Never  Vaping Use   Vaping Use: Never used  Substance and Sexual Activity   Alcohol use: No    Alcohol/week: 0.0 standard drinks of  alcohol   Drug use: No   Sexual activity: Not on file  Other Topics Concern   Not on file  Social History Narrative   ** Merged History Encounter **       Social Determinants of Health   Financial Resource Strain: Not on file  Food Insecurity: Not on file  Transportation Needs: Not on file  Physical Activity: Not on file  Stress: Not on file  Social Connections: Not on file  Intimate Partner Violence: Not on file    Review of Systems: See HPI, otherwise negative ROS  Physical Exam: BP 126/84   Pulse 62   Temp (!) 97.2 F (36.2 C) (Temporal)   Resp 18   Ht 5' 4.02" (1.626 m)   Wt 107 kg   SpO2 94%   BMI 40.45 kg/m  General:   Alert, cooperative in NAD Head:  Normocephalic and atraumatic. Respiratory:  Normal work of breathing. Cardiovascular:  RRR  Impression/Plan: JECENIA SEAVEY is here for cataract surgery.  Risks, benefits, limitations, and alternatives regarding cataract surgery have been reviewed with the patient.  Questions have been answered.  All parties agreeable.   Galen Manila, MD  09/10/2022, 9:11 AM

## 2022-09-17 DIAGNOSIS — H2512 Age-related nuclear cataract, left eye: Secondary | ICD-10-CM | POA: Diagnosis not present

## 2022-09-23 ENCOUNTER — Encounter: Payer: Self-pay | Admitting: Physical Medicine and Rehabilitation

## 2022-09-23 ENCOUNTER — Encounter: Payer: Medicare PPO | Attending: Physical Medicine and Rehabilitation | Admitting: Physical Medicine and Rehabilitation

## 2022-09-23 VITALS — BP 111/75 | HR 71 | Ht 64.2 in | Wt 235.0 lb

## 2022-09-23 DIAGNOSIS — M48062 Spinal stenosis, lumbar region with neurogenic claudication: Secondary | ICD-10-CM | POA: Diagnosis not present

## 2022-09-23 DIAGNOSIS — S62101D Fracture of unspecified carpal bone, right wrist, subsequent encounter for fracture with routine healing: Secondary | ICD-10-CM | POA: Diagnosis not present

## 2022-09-23 DIAGNOSIS — M5417 Radiculopathy, lumbosacral region: Secondary | ICD-10-CM

## 2022-09-23 DIAGNOSIS — M5442 Lumbago with sciatica, left side: Secondary | ICD-10-CM | POA: Diagnosis not present

## 2022-09-23 DIAGNOSIS — M5441 Lumbago with sciatica, right side: Secondary | ICD-10-CM | POA: Diagnosis not present

## 2022-09-23 DIAGNOSIS — S62101A Fracture of unspecified carpal bone, right wrist, initial encounter for closed fracture: Secondary | ICD-10-CM | POA: Insufficient documentation

## 2022-09-23 DIAGNOSIS — G8929 Other chronic pain: Secondary | ICD-10-CM | POA: Diagnosis not present

## 2022-09-23 DIAGNOSIS — M792 Neuralgia and neuritis, unspecified: Secondary | ICD-10-CM | POA: Diagnosis not present

## 2022-09-23 DIAGNOSIS — G894 Chronic pain syndrome: Secondary | ICD-10-CM

## 2022-09-23 MED ORDER — GABAPENTIN 400 MG PO CAPS: 400.0000 mg | ORAL_CAPSULE | Freq: Four times a day (QID) | ORAL | 1 refills | Status: DC

## 2022-09-23 MED ORDER — HYDROCODONE-ACETAMINOPHEN 5-325 MG PO TABS: 1.0000 | ORAL_TABLET | Freq: Four times a day (QID) | ORAL | 0 refills | Status: DC | PRN

## 2022-09-23 NOTE — Progress Notes (Signed)
Subjective:    Patient ID: Jamie Phillips, female    DOB: 02-23-1941, 82 y.o.   MRN: [id number]  HPI  Pt is a 82 yr old female with BMI of 41, CLL; hx of Bell's palsy; central lumbar stenosis and radiculopathy- hx of Lumbar lami L2-L5; HTN- but not lately. 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  Here for f/u on chronic pain.    Had a "nose dive"- lost balance and fell-  and broke R arm in 3 places-  Can use RUE- but cannot use R hand to feed self-  and cannot hold onto anything- and cannot write with R hand. R shoulder has bothered her ever since, but not broken.  Had to carry her to hospital - son- to get her there on own-  Is R handed.  Has plate in R wrist- to go back and take out after 3+ months.  Distal radial fx of R wrist-    Went up on gabapentin- was helpful - esp late in afternoon vs AM- makes her sleepy- 1 in AM and 2 pills in evenings- and sometimes if starts stinging, burning, wakes up and takes another gabapentin- feels like on fire sometimes.  3 toes on R foot and 1 toe on L foot.   Neuropathy still bothers her frequently.  Muscle relaxant hasn't helped much- cannot see difference when on it.   Hasn't taken Norco lately- they gave her 10 pills when broke wrist. And then another 10 pills- of Oxy.   Also out of Norco in awhile.     Had epidural steroid injection by Dr Lindalou Hose office- did great until fell- then stopped working- right after saw me last- which was 3/4- cannot see documentation in chart of it.   Has appt tomorrow with Dr Jarold Motto tomorrow- the Orthopod- and will need to get plate out in future per Dr Norval Gable note.    Didn't think lidoderm patches were that helpful and cost her $200/month    Pain Inventory Average Pain 5 Pain Right Now 7 My pain is constant and aching  In the last 24 hours, has pain interfered with the following? General activity 1 Relation with others  1 Enjoyment of life 1 What TIME of day is your pain at its worst? daytime and evening Sleep (in general) Good  Pain is worse with: walking, bending, sitting, inactivity, standing, and some activites Pain improves with: rest, therapy/exercise, pacing activities, medication, and injections Relief from Meds: 7  Family History  Problem Relation Age of Onset   Pulmonary embolism Mother    Sudden death Mother    Obesity Mother    Heart failure Father    Congestive Heart Failure Father    Sudden death Father    Thyroid cancer Brother    Breast cancer Neg Hx    Social History   Socioeconomic History   Marital status: Widowed    Spouse name: Not on file   Number of children: Not on file   Years of education: Not on file   Highest education level: Not on file  Occupational History   Occupation: Retired  Tobacco Use   Smoking status: Never   Smokeless tobacco: Never  Vaping Use   Vaping Use: Never used  Substance and Sexual Activity   Alcohol use: No    Alcohol/week: 0.0 standard drinks of alcohol   Drug use: No   Sexual activity: Not on  file  Other Topics Concern   Not on file  Social History Narrative   ** Merged History Encounter **       Social Determinants of Health   Financial Resource Strain: Not on file  Food Insecurity: Not on file  Transportation Needs: Not on file  Physical Activity: Not on file  Stress: Not on file  Social Connections: Not on file   Past Surgical History:  Procedure Laterality Date   APPENDECTOMY     BREAST BIOPSY Left 08/19/2004   lt bx/clip-neg   CATARACT EXTRACTION W/PHACO Left 09/10/2022   Procedure: CATARACT EXTRACTION PHACO AND INTRAOCULAR LENS PLACEMENT (IOC) LEFT  5.88  00:31.9;  Surgeon: Galen Manila, MD;  Location: [facility];  Service: Ophthalmology;  Laterality: Left;  sleep apnea   COLONOSCOPY     COLONOSCOPY WITH PROPOFOL N/A 03/10/2017   Procedure: COLONOSCOPY WITH PROPOFOL;  Surgeon: Christena Deem, MD;   Location: [facility] ENDOSCOPY;  Service: Endoscopy;  Laterality: N/A;   ESOPHAGOGASTRODUODENOSCOPY (EGD) WITH PROPOFOL N/A 02/02/2019   Procedure: ESOPHAGOGASTRODUODENOSCOPY (EGD) WITH PROPOFOL;  Surgeon: Toledo, Boykin Nearing, MD;  Location: [facility] ENDOSCOPY;  Service: Endoscopy;  Laterality: N/A;   IVC FILTER INSERTION N/A 06/11/2016   Procedure: IVC Filter Insertion;  Surgeon: Renford Dills, MD;  Location: [facility] INVASIVE CV LAB;  Service: Cardiovascular;  Laterality: N/A;   IVC FILTER REMOVAL N/A 05/06/2017   Procedure: IVC FILTER REMOVAL;  Surgeon: Renford Dills, MD;  Location: [facility] INVASIVE CV LAB;  Service: Cardiovascular;  Laterality: N/A;   JOINT REPLACEMENT Left 06/17/2016   Knees, pt stated left and right have been replaced.   LAPAROSCOPIC APPENDECTOMY N/A 10/10/2018   Procedure: APPENDECTOMY LAPAROSCOPIC ATTEMPTED CONVERTED TO OPEN;  Surgeon: Carolan Shiver, MD;  Location: [facility] ORS;  Service: General;  Laterality: N/A;   LAPAROSCOPY N/A 10/10/2018   Procedure: LAPAROSCOPY DIAGNOSTIC ATTEMPTED CONVERTED TO OPEN;  Surgeon: Carolan Shiver, MD;  Location: [facility] ORS;  Service: General;  Laterality: N/A;   LUMBAR LAMINECTOMY/DECOMPRESSION MICRODISCECTOMY N/A 07/09/2021   Procedure: Laminectomy and Foraminotomy - Lumbar Two-Lumbar Three - Lumbar Three-Lumbar Four - Lumbar Four-Lumbar Five;  Surgeon: Julio Sicks, MD;  Location: [facility] OR;  Service: Neurosurgery;  Laterality: N/A;   neck fusion     PARTIAL COLECTOMY Right 10/10/2018   Procedure: PARTIAL COLECTOMY;  Surgeon: Carolan Shiver, MD;  Location: [facility] ORS;  Service: General;  Laterality: Right;   PERIPHERAL VASCULAR CATHETERIZATION N/A 08/15/2015   Procedure: pulmonary angiogram with lysis;  Surgeon: Renford Dills, MD;  Location: [facility] INVASIVE CV LAB;  Service: Cardiovascular;  Laterality: N/A;   WRIST SURGERY Left 2000   Past Surgical History:  Procedure Laterality Date   APPENDECTOMY     BREAST BIOPSY Left  08/19/2004   lt bx/clip-neg   CATARACT EXTRACTION W/PHACO Left 09/10/2022   Procedure: CATARACT EXTRACTION PHACO AND INTRAOCULAR LENS PLACEMENT (IOC) LEFT  5.88  00:31.9;  Surgeon: Galen Manila, MD;  Location: [facility];  Service: Ophthalmology;  Laterality: Left;  sleep apnea   COLONOSCOPY     COLONOSCOPY WITH PROPOFOL N/A 03/10/2017   Procedure: COLONOSCOPY WITH PROPOFOL;  Surgeon: Christena Deem, MD;  Location: [facility] ENDOSCOPY;  Service: Endoscopy;  Laterality: N/A;   ESOPHAGOGASTRODUODENOSCOPY (EGD) WITH PROPOFOL N/A 02/02/2019   Procedure: ESOPHAGOGASTRODUODENOSCOPY (EGD) WITH PROPOFOL;  Surgeon: Toledo, Boykin Nearing, MD;  Location: [facility] ENDOSCOPY;  Service: Endoscopy;  Laterality: N/A;   IVC FILTER INSERTION N/A 06/11/2016   Procedure: IVC Filter Insertion;  Surgeon: Renford Dills, MD;  Location: [facility] INVASIVE CV LAB;  Service: Cardiovascular;  Laterality: N/A;   IVC FILTER REMOVAL N/A 05/06/2017   Procedure: IVC FILTER REMOVAL;  Surgeon: Renford Dills, MD;  Location: [facility] INVASIVE CV LAB;  Service: Cardiovascular;  Laterality: N/A;   JOINT REPLACEMENT Left 06/17/2016   Knees, pt stated left and right have been replaced.   LAPAROSCOPIC APPENDECTOMY N/A 10/10/2018   Procedure: APPENDECTOMY LAPAROSCOPIC ATTEMPTED CONVERTED TO OPEN;  Surgeon: Carolan Shiver, MD;  Location: [facility] ORS;  Service: General;  Laterality: N/A;   LAPAROSCOPY N/A 10/10/2018   Procedure: LAPAROSCOPY DIAGNOSTIC ATTEMPTED CONVERTED TO OPEN;  Surgeon: Carolan Shiver, MD;  Location: [facility] ORS;  Service: General;  Laterality: N/A;   LUMBAR LAMINECTOMY/DECOMPRESSION MICRODISCECTOMY N/A 07/09/2021   Procedure: Laminectomy and Foraminotomy - Lumbar Two-Lumbar Three - Lumbar Three-Lumbar Four - Lumbar Four-Lumbar Five;  Surgeon: Julio Sicks, MD;  Location: [facility] OR;  Service: Neurosurgery;  Laterality: N/A;   neck fusion     PARTIAL COLECTOMY Right 10/10/2018   Procedure: PARTIAL COLECTOMY;   Surgeon: Carolan Shiver, MD;  Location: [facility] ORS;  Service: General;  Laterality: Right;   PERIPHERAL VASCULAR CATHETERIZATION N/A 08/15/2015   Procedure: pulmonary angiogram with lysis;  Surgeon: Renford Dills, MD;  Location: [facility] INVASIVE CV LAB;  Service: Cardiovascular;  Laterality: N/A;   WRIST SURGERY Left 2000   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)    Macular degeneration    MGUS (monoclonal gammopathy of unknown significance)    Morbid obesity (HCC)    Obesity    Pulmonary embolism (HCC)    PVD (peripheral vascular disease) (HCC)    Sleep apnea    CPAP   BP 111/75   Pulse 71   Ht 5' 4.2" (1.631 m)   Wt 235 lb (106.6 kg)   BMI 40.09 kg/m   Opioid Risk Score:   Fall Risk Score:  `1  Depression screen PHQ 2/9     09/23/2022    1:07 PM 06/24/2022    1:46 PM 03/11/2022    1:37 PM 11/16/2021   11:18 AM 11/05/2021   10:42 AM 06/26/2021    2:11 PM 05/22/2021    8:41 AM  Depression screen PHQ 2/9  Decreased Interest 0 1 0 2 0 0 0  Down, Depressed, Hopeless 0 1 0 0 0 0 0  PHQ - 2 Score 0 2 0 2 0 0 0  Altered sleeping    1     Tired, decreased energy    1     Change in appetite    0     Feeling bad or failure about yourself     0     Trouble concentrating    0     Moving slowly or fidgety/restless    0     Suicidal thoughts    0     PHQ-9 Score    4     Difficult doing work/chores  Very difficult       Review of Systems  Musculoskeletal:  Positive for back pain and gait problem.      Objective:   Physical Exam Awake, alert,  appropriate, using cane- single point cane, accompanied by friend, NAD  R wrist looks like not in straight line- Muscle atrophy of thenar eminence  RUE- WE- cannot do due to plate- no movement at all Grip 4+/5 and FA 4-/5 Can make claw, but not fist-  Can curl tips, but not as much PIPs-  Thumb abductions and flexion weak as well   TTP over R low back-   Neuro: R hand 1st/2nd digit tingling on exam, said not decreased to light touch-  not specific to median compression/neuropathy Tinel's (-) on R hand         Assessment & Plan:    Pt is a 82 yr old female with BMI of 41, CLL; hx of Bell's palsy; central lumbar stenosis and radiculopathy- hx of Lumbar lami L2-L5; HTN- but not lately. 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  Here for f/u on chronic pain.    Increase gabapentin to 400 mg in AM, 800 mg in evening and can take another 400 mg after bedtime for neuropathy.   2. Will refill Norco - 5/325 mg up to 4x/day as needed # 120  3. Sees Dr Jarold Motto tomorrow- for R wrist .   4. Tramadol doesn't work well for her.    5. Can call Dr Lindalou Hose office and see when you another injection.   6. I think, you would benefit from an EMG/NCS - Dr Jarold Motto might be able ot get this test done FASTER than I could- and might be able ot act on it as well. Let her know that we discussed this.   7. F/U in 3 months- on pain meds/chronic pain and neuropathy.  I spent a total of  33  minutes on total care today- >50% coordination of care- due to   Education on NCS/EMG and exam- d/w pt need to take paperwork to f/u with Ortho also d/w pt about R back pain and f/u ESI

## 2022-09-23 NOTE — Patient Instructions (Signed)
Awake, alert, appropriate, using cane- single point cane, accompanied by friend, NAD  R wrist looks like not in straight line- Muscle atrophy of thenar eminence  RUE- WE- cannot do due to plate- no movement at all Grip 4+/5 and FA 4-/5 Can make claw, but not fist-  Can curl tips, but not as much PIPs-  Thumb abductions and flexion weak as well   TTP over R low back-   Neuro: R hand 1st/2nd digit tingling on exam, said not decreased to light touch-  not specific to median compression/neuropathy Tinel's (-) on R hand         Assessment & Plan:    Pt is a 82 yr old female with BMI of 41, CLL; hx of Bell's palsy; central lumbar stenosis and radiculopathy- hx of Lumbar lami L2-L5; HTN- but not lately. 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  Here for f/u on chronic pain.    Increase gabapentin to 400 mg in AM, 800 mg in evening and can take another 400 mg after bedtime for neuropathy.   2. Will refill Norco - 5/325 mg up to 4x/day as needed # 120  3. Sees Dr Jarold Motto tomorrow- for R wrist .   4. Tramadol doesn't work well for her.    5. Can call Dr Lindalou Hose office and see when you another injection.   6. I think, you would benefit from an EMG/NCS - Dr Jarold Motto might be able ot get this test done FASTER than I could- and might be able ot act on it as well. Let her know that we discussed this.   7. F/U in 3 months- on pain meds/chronic pain and neuropathy.

## 2022-09-24 DIAGNOSIS — Z967 Presence of other bone and tendon implants: Secondary | ICD-10-CM | POA: Diagnosis not present

## 2022-09-24 DIAGNOSIS — Z8781 Personal history of (healed) traumatic fracture: Secondary | ICD-10-CM | POA: Diagnosis not present

## 2022-09-24 DIAGNOSIS — S52501D Unspecified fracture of the lower end of right radius, subsequent encounter for closed fracture with routine healing: Secondary | ICD-10-CM | POA: Diagnosis not present

## 2022-09-24 DIAGNOSIS — M1812 Unilateral primary osteoarthritis of first carpometacarpal joint, left hand: Secondary | ICD-10-CM | POA: Diagnosis not present

## 2022-09-24 DIAGNOSIS — Z9889 Other specified postprocedural states: Secondary | ICD-10-CM | POA: Diagnosis not present

## 2022-09-24 DIAGNOSIS — S52122D Displaced fracture of head of left radius, subsequent encounter for closed fracture with routine healing: Secondary | ICD-10-CM | POA: Diagnosis not present

## 2022-09-26 ENCOUNTER — Emergency Department (HOSPITAL_COMMUNITY): Payer: Medicare PPO

## 2022-09-26 ENCOUNTER — Encounter (HOSPITAL_COMMUNITY): Payer: Self-pay

## 2022-09-26 ENCOUNTER — Emergency Department (HOSPITAL_COMMUNITY)
Admission: EM | Admit: 2022-09-26 | Discharge: 2022-09-26 | Disposition: A | Discharge status: Home / Self Care | Payer: Medicare PPO | Attending: Emergency Medicine | Admitting: Emergency Medicine

## 2022-09-26 DIAGNOSIS — S61412A Laceration without foreign body of left hand, initial encounter: Secondary | ICD-10-CM | POA: Diagnosis not present

## 2022-09-26 DIAGNOSIS — Z79899 Other long term (current) drug therapy: Secondary | ICD-10-CM | POA: Insufficient documentation

## 2022-09-26 DIAGNOSIS — M7989 Other specified soft tissue disorders: Secondary | ICD-10-CM | POA: Diagnosis not present

## 2022-09-26 DIAGNOSIS — W109XXA Fall (on) (from) unspecified stairs and steps, initial encounter: Secondary | ICD-10-CM | POA: Insufficient documentation

## 2022-09-26 DIAGNOSIS — N189 Chronic kidney disease, unspecified: Secondary | ICD-10-CM | POA: Insufficient documentation

## 2022-09-26 DIAGNOSIS — S52181A Other fracture of upper end of right radius, initial encounter for closed fracture: Secondary | ICD-10-CM | POA: Insufficient documentation

## 2022-09-26 DIAGNOSIS — S52611A Displaced fracture of right ulna styloid process, initial encounter for closed fracture: Secondary | ICD-10-CM | POA: Diagnosis not present

## 2022-09-26 DIAGNOSIS — I129 Hypertensive chronic kidney disease with stage 1 through stage 4 chronic kidney disease, or unspecified chronic kidney disease: Secondary | ICD-10-CM | POA: Diagnosis not present

## 2022-09-26 DIAGNOSIS — S0990XA Unspecified injury of head, initial encounter: Secondary | ICD-10-CM | POA: Diagnosis not present

## 2022-09-26 DIAGNOSIS — S0101XA Laceration without foreign body of scalp, initial encounter: Secondary | ICD-10-CM | POA: Insufficient documentation

## 2022-09-26 DIAGNOSIS — W19XXXA Unspecified fall, initial encounter: Secondary | ICD-10-CM | POA: Diagnosis not present

## 2022-09-26 DIAGNOSIS — Z043 Encounter for examination and observation following other accident: Secondary | ICD-10-CM | POA: Diagnosis not present

## 2022-09-26 DIAGNOSIS — S0003XA Contusion of scalp, initial encounter: Secondary | ICD-10-CM | POA: Diagnosis not present

## 2022-09-26 DIAGNOSIS — W108XXA Fall (on) (from) other stairs and steps, initial encounter: Secondary | ICD-10-CM

## 2022-09-26 DIAGNOSIS — Z7901 Long term (current) use of anticoagulants: Secondary | ICD-10-CM | POA: Diagnosis not present

## 2022-09-26 DIAGNOSIS — S52101A Unspecified fracture of upper end of right radius, initial encounter for closed fracture: Secondary | ICD-10-CM | POA: Diagnosis not present

## 2022-09-26 DIAGNOSIS — M47812 Spondylosis without myelopathy or radiculopathy, cervical region: Secondary | ICD-10-CM | POA: Diagnosis not present

## 2022-09-26 LAB — CBC WITH DIFFERENTIAL/PLATELET
Abs Immature Granulocytes: 0 10*3/uL (ref 0.00–0.07)
Basophils Absolute: 0 10*3/uL (ref 0.0–0.1)
Basophils Relative: 0 %
Eosinophils Absolute: 0.2 10*3/uL (ref 0.0–0.5)
Eosinophils Relative: 1 %
HCT: 38.3 % (ref 36.0–46.0)
Hemoglobin: 12.1 g/dL (ref 12.0–15.0)
Immature Granulocytes: 0 %
Lymphocytes Relative: 70 %
Lymphs Abs: 10.9 10*3/uL — ABNORMAL HIGH (ref 0.7–4.0)
MCH: 30.1 pg (ref 26.0–34.0)
MCHC: 31.6 g/dL (ref 30.0–36.0)
MCV: 95.3 fL (ref 80.0–100.0)
Monocytes Absolute: 0 10*3/uL — ABNORMAL LOW (ref 0.1–1.0)
Monocytes Relative: 0 %
Neutro Abs: 4.5 10*3/uL (ref 1.7–7.7)
Neutrophils Relative %: 29 %
Platelets: 335 10*3/uL (ref 150–400)
RBC: 4.02 MIL/uL (ref 3.87–5.11)
RDW: 15.4 % (ref 11.5–15.5)
WBC: 15.5 10*3/uL — ABNORMAL HIGH (ref 4.0–10.5)
nRBC: 0 % (ref 0.0–0.2)
nRBC: 0 /100 WBC

## 2022-09-26 LAB — BASIC METABOLIC PANEL
Anion gap: 9 (ref 5–15)
BUN: 16 mg/dL (ref 8–23)
CO2: 28 mmol/L (ref 22–32)
Calcium: 8.9 mg/dL (ref 8.9–10.3)
Chloride: 105 mmol/L (ref 98–111)
Creatinine, Ser: 1.23 mg/dL — ABNORMAL HIGH (ref 0.44–1.00)
GFR, Estimated: 44 mL/min — ABNORMAL LOW (ref >60)
Glucose, Bld: 125 mg/dL — ABNORMAL HIGH (ref 70–99)
Potassium: 4.1 mmol/L (ref 3.5–5.1)
Sodium: 142 mmol/L (ref 135–145)

## 2022-09-26 MED ORDER — HYDROCODONE-ACETAMINOPHEN 5-325 MG PO TABS
1.0000 | ORAL_TABLET | Freq: Once | ORAL | Status: AC
  Administered 2022-09-26: 1 via ORAL
  Filled 2022-09-26: qty 1

## 2022-09-26 MED ORDER — FENTANYL CITRATE PF 50 MCG/ML IJ SOSY
50.0000 ug | PREFILLED_SYRINGE | Freq: Once | INTRAMUSCULAR | Status: AC
  Administered 2022-09-26: 50 ug via INTRAVENOUS
  Filled 2022-09-26: qty 1

## 2022-09-26 NOTE — Progress Notes (Signed)
Orthopedic Tech Progress Note Patient Details:  Jamie Phillips 10/19/1940 [id number]  Level 2 Trauma Patient ID: [name], female   DOB: October 10, 1940, 82 y.o.   MRN: [id number]  [name] 09/26/2022, 3:50 PM

## 2022-09-26 NOTE — ED Notes (Signed)
This paramedic accompanied pt to CT then to X-ray. Pt returned to the room without incident.

## 2022-09-26 NOTE — Discharge Instructions (Addendum)
Follow up with your orthopedic surgeon for recheck right wrist pain after fall with recent surgery. Keep splint clean and dry.

## 2022-09-26 NOTE — ED Notes (Signed)
XR at bedside

## 2022-09-26 NOTE — ED Notes (Signed)
Discharge instructions discussed with pt. Verbalized understanding. VSS. No questions or concerns regarding discharge  

## 2022-09-26 NOTE — ED Triage Notes (Signed)
Walking down steps. Fell onto concrete. Hit her head. On Eliquis. No LOC. GCS 15. C-collar in place.

## 2022-09-26 NOTE — ED Provider Notes (Addendum)
[facility] Provider Note   CSN: [id number] Arrival date & time: 09/26/22  1527     History  Chief Complaint  Patient presents with   Fall   Level 2    Jamie Phillips is a 82 y.o. female.  82 year old female brought in by EMS after fall down 3 steps, resulting in laceration to posterior scalp and left hand pain, minor left hand lac. Unable to get back up after the fall which she relates to baseline health and habitus.  Patient is on Eliquis, presents as a level 2 trauma due to fall on blood thinners, is alert and oriented. Reports recent right wrist repaired with plate, states numbness/pain in this wrist is at baseline.        Home Medications Prior to Admission medications   Medication Sig Start Date End Date Taking? Authorizing Provider  acetaminophen (TYLENOL) 650 MG CR tablet Take 650-1,300 mg by mouth every 8 (eight) hours as needed for pain. TYLENOL ARTHRITIS    [provider]  apixaban (ELIQUIS) 5 MG TABS tablet Take 1 tablet (5 mg total) by mouth 2 (two) times daily. 09/28/21   Earna Coder, MD  b complex vitamins capsule Take 1 capsule by mouth daily.    [provider]  calcium carbonate (OSCAL) 1500 (600 Ca) MG TABS tablet Take 1,500 mg by mouth 2 (two) times daily with a meal.    [provider]  Cholecalciferol (VITAMIN D-3) 125 MCG (5000 UT) TABS Take by mouth daily.    [provider]  cholestyramine (QUESTRAN) 4 g packet Take 1 packet (4 g total) by mouth 3 (three) times daily with meals. Patient taking differently: Take 4 g by mouth 3 (three) times daily as needed. 04/30/21   Langston Reusing, MD  DULoxetine (CYMBALTA) 60 MG capsule TAKE 1 CAPSULE (60 MG TOTAL) BY MOUTH DAILY. 09/04/22   Lovorn, Aundra Millet, MD  escitalopram (LEXAPRO) 20 MG tablet Take 1 tablet by mouth daily. 05/20/22   [provider]  gabapentin (NEURONTIN) 400 MG capsule Take 1 capsule (400 mg total) by  mouth 4 (four) times daily. Take 1 pill in Am and 2 pills at night- for nerve pain 09/23/22   Lovorn, Aundra Millet, MD  HYDROcodone-acetaminophen (NORCO) 5-325 MG tablet Take 1 tablet by mouth every 6 (six) hours as needed for moderate pain. 09/23/22   Lovorn, Aundra Millet, MD  losartan-hydrochlorothiazide (HYZAAR) 100-12.5 MG tablet Take 0.5 tablets by mouth daily. Reported on 08/22/2015 06/18/21   Langston Reusing, MD  magnesium gluconate (MAGONATE) 500 MG tablet Take 500 mg by mouth 2 (two) times daily.    [provider]  methocarbamol (ROBAXIN) 500 MG tablet TAKE 1 TABLET (500 MG TOTAL) BY MOUTH EVERY 8 (EIGHT) HOURS AS NEEDED FOR MUSCLE SPASMS. 06/24/22   Lovorn, Aundra Millet, MD  Multiple Vitamins-Minerals (PRESERVISION AREDS 2 PO) Take 1 tablet by mouth in the morning and at bedtime.    [provider]  pravastatin (PRAVACHOL) 40 MG tablet Take 1 tablet by mouth daily. 06/27/21   [provider]  propranolol (INDERAL) 40 MG tablet Take 40 mg by mouth daily. 09/28/20   [provider]  sucralfate (CARAFATE) 1 g tablet Take 1 g by mouth daily.    [provider]  TURMERIC CURCUMIN PO Take 1 capsule by mouth in the morning and at bedtime.    [provider]      Allergies    Patient has  no known allergies.    Review of Systems   Review of Systems Negative except as per HPI Physical Exam Updated Vital Signs BP 131/68   Pulse 68   Temp (!) 97 F (36.1 C) (Oral)   Resp 19   Ht 5\' 4"  (1.626 m)   Wt 106 kg   SpO2 98%   BMI 40.11 kg/m  Physical Exam Vitals and nursing note reviewed.  Constitutional:      General: She is not in acute distress.    Appearance: She is well-developed. She is not diaphoretic.     Interventions: Cervical collar in place.     Comments: Minor laceration to posterior parietal scalp  HENT:     Head: Normocephalic.     Nose: Nose normal.     Mouth/Throat:     Mouth: Mucous membranes are moist.  Eyes:     Extraocular Movements:  Extraocular movements intact.     Pupils: Pupils are equal, round, and reactive to light.  Cardiovascular:     Pulses: Normal pulses.  Pulmonary:     Effort: Pulmonary effort is normal.  Abdominal:     Palpations: Abdomen is soft.     Tenderness: There is no abdominal tenderness.  Musculoskeletal:        General: Swelling and tenderness present. No deformity.     Cervical back: No tenderness.     Comments: No pain with log roll hips. TTP left hand along 5th metacarpal. With skin tear TTP right wrist   Skin:    General: Skin is warm and dry.     Findings: No erythema or rash.  Neurological:     Mental Status: She is alert and oriented to person, place, and time.     Cranial Nerves: No cranial nerve deficit.     Sensory: No sensory deficit.     Motor: No weakness.  Psychiatric:        Behavior: Behavior normal.     ED Results / Procedures / Treatments   Labs (all labs ordered are listed, but only abnormal results are displayed) Labs Reviewed  CBC WITH DIFFERENTIAL/PLATELET - Abnormal; Notable for the following components:      Result Value   WBC 15.5 (*)    Lymphs Abs 10.9 (*)    Monocytes Absolute 0.0 (*)    All other components within normal limits  BASIC METABOLIC PANEL - Abnormal; Notable for the following components:   Glucose, Bld 125 (*)    Creatinine, Ser 1.23 (*)    GFR, Estimated 44 (*)    All other components within normal limits  PATHOLOGIST SMEAR REVIEW    EKG None  Radiology DG Forearm Right  Result Date: 09/26/2022 CLINICAL DATA:  Fall, pain EXAM: RIGHT FOREARM - 2 VIEW COMPARISON:  Same day wrist radiographs at 4:08 p.m. FINDINGS: Demineralization. Dorsal plate and screw fixation of the distal radial diaphysis extending to the metacarpals. Similar alignment of the comminuted distal radial metaphysis fracture. A fracture about the most proximal screw within the mid radial diaphysis is new or more conspicuous from prior. Unchanged ulnar styloid fracture.   Soft tissue swelling. IMPRESSION: 1. New or more conspicuous mid radial diaphysis fracture surrounding the most proximal screw of the radial plate. 2. Unchanged comminuted fracture of the distal radial metaphysis. 3. Unchanged displaced ulnar styloid fracture. Electronically Signed   By: Minerva Fester M.D.   On: 09/26/2022 19:51   DG Wrist Complete Right  Result Date: 09/26/2022 CLINICAL DATA:  Fall. Pain.  Status post ORIF of right wrist fracture 07/2022. Plans to have hardware removed in 6 weeks. EXAM: RIGHT WRIST - COMPLETE 3+ VIEW COMPARISON:  Right wrist radiographs 08/02/2022 FINDINGS: There is diffuse decreased bone mineralization. There is new dorsal plate and screw fixation of the distal radial diaphysis and the proximal third metacarpal. There is improved alignment of the previously seen comminuted, predominantly transverse fracture-dislocation of the distal radial metaphysis and epiphysis. There is persistent fracture line lucency. Minimal early peripheral fracture line healing sclerosis. Subtle fracture line extension to the distal articular surface, similar to prior. There are two displaced ossicles from subacute ulnar styloid fracture. Moderate triscaphe, thumb carpometacarpal, diffuse metacarpophalangeal, and diffuse interphalangeal osteoarthritis, greatest within the thumb interphalangeal joint. IMPRESSION: Compared to 08/02/2022: 1. New dorsal plate and screw fixation of the distal radial diaphysis and the proximal third metacarpal. There is improved alignment of the previously seen comminuted, predominantly transverse fracture-dislocation of the distal radial metaphysis and epiphysis. There is persistent fracture line lucency. 2. Redemonstration of displaced ulnar styloid fracture. No definite new acute fracture is identified. Electronically Signed   By: Neita Garnet M.D.   On: 09/26/2022 16:59   DG Hand Complete Left  Result Date: 09/26/2022 CLINICAL DATA:  Larey Seat onto concrete. EXAM: LEFT  HAND - COMPLETE 3+ VIEW COMPARISON:  None Available. FINDINGS: Metallic rings obscure portions of the proximal phalanx of the fourth finger. There is diffuse decreased bone mineralization. 2 mm ulnar positive variance. Moderate triscaphe, moderate thumb carpometacarpal, moderate to severe thumb interphalangeal, severe second DIP, moderate third DIP, mild-to-moderate fourth and fifth DIP, moderate third through fifth PIP, mild second PIP, moderate third metacarpophalangeal, mild second metacarpophalangeal and mild second through fourth carpometacarpal degenerative changes including joint space narrowing, subchondral sclerosis, and peripheral osteophytosis. No acute fracture is seen. IMPRESSION: 1. No acute fracture. 2. Polyarticular osteoarthritis, greatest within the second DIP, thumb interphalangeal, and thumb carpometacarpal joints. Electronically Signed   By: Neita Garnet M.D.   On: 09/26/2022 16:55   CT Head Wo Contrast  Result Date: 09/26/2022 CLINICAL DATA:  Head trauma, minor (Age >= 65y); Neck trauma (Age >= 65y). EXAM: CT HEAD WITHOUT CONTRAST CT CERVICAL SPINE WITHOUT CONTRAST TECHNIQUE: Multidetector CT imaging of the head and cervical spine was performed following the standard protocol without intravenous contrast. Multiplanar CT image reconstructions of the cervical spine were also generated. RADIATION DOSE REDUCTION: This exam was performed according to the departmental dose-optimization program which includes automated exposure control, adjustment of the mA and/or kV according to patient size and/or use of iterative reconstruction technique. COMPARISON:  Head CT 08/17/2013. FINDINGS: CT HEAD FINDINGS Brain: No acute hemorrhage. Unchanged mild chronic small-vessel disease. Cortical gray-white differentiation is otherwise preserved. No hydrocephalus or extra-axial collection. No mass effect or midline shift. Vascular: No hyperdense vessel or unexpected calcification. Skull: No calvarial fracture or  suspicious bone lesion. Skull base is unremarkable. Sinuses/Orbits: Moderate mucosal disease in the left maxillary sinus ethmoid air cells. Orbits are unremarkable. Other: Right parietal scalp hematoma. CT CERVICAL SPINE FINDINGS Alignment: Normal. Skull base and vertebrae: Prior C5-C7 ACDF. Solid bony fusion across the intervening disc spaces. Hardware is intact. No associated lucency or fracture. Soft tissues and spinal canal: No prevertebral fluid or swelling. No visible canal hematoma. Disc levels: Multilevel cervical spondylosis, worst at C3-4, where there is at least mild spinal canal stenosis. Upper chest: Unremarkable. Other: None. IMPRESSION: 1. No evidence of acute intracranial injury. Right parietal scalp hematoma without underlying calvarial fracture. 2. No acute fracture or traumatic  listhesis in the cervical spine. Electronically Signed   By: Orvan Falconer M.D.   On: 09/26/2022 16:24   CT Cervical Spine Wo Contrast  Result Date: 09/26/2022 CLINICAL DATA:  Head trauma, minor (Age >= 65y); Neck trauma (Age >= 65y). EXAM: CT HEAD WITHOUT CONTRAST CT CERVICAL SPINE WITHOUT CONTRAST TECHNIQUE: Multidetector CT imaging of the head and cervical spine was performed following the standard protocol without intravenous contrast. Multiplanar CT image reconstructions of the cervical spine were also generated. RADIATION DOSE REDUCTION: This exam was performed according to the departmental dose-optimization program which includes automated exposure control, adjustment of the mA and/or kV according to patient size and/or use of iterative reconstruction technique. COMPARISON:  Head CT 08/17/2013. FINDINGS: CT HEAD FINDINGS Brain: No acute hemorrhage. Unchanged mild chronic small-vessel disease. Cortical gray-white differentiation is otherwise preserved. No hydrocephalus or extra-axial collection. No mass effect or midline shift. Vascular: No hyperdense vessel or unexpected calcification. Skull: No calvarial  fracture or suspicious bone lesion. Skull base is unremarkable. Sinuses/Orbits: Moderate mucosal disease in the left maxillary sinus ethmoid air cells. Orbits are unremarkable. Other: Right parietal scalp hematoma. CT CERVICAL SPINE FINDINGS Alignment: Normal. Skull base and vertebrae: Prior C5-C7 ACDF. Solid bony fusion across the intervening disc spaces. Hardware is intact. No associated lucency or fracture. Soft tissues and spinal canal: No prevertebral fluid or swelling. No visible canal hematoma. Disc levels: Multilevel cervical spondylosis, worst at C3-4, where there is at least mild spinal canal stenosis. Upper chest: Unremarkable. Other: None. IMPRESSION: 1. No evidence of acute intracranial injury. Right parietal scalp hematoma without underlying calvarial fracture. 2. No acute fracture or traumatic listhesis in the cervical spine. Electronically Signed   By: Orvan Falconer M.D.   On: 09/26/2022 16:24    Procedures .Marland KitchenLaceration Repair  Date/Time: 09/26/2022 6:56 PM  Performed by: Jeannie Fend, PA-C Authorized by: Jeannie Fend, PA-C   Consent:    Consent obtained:  Verbal   Consent given by:  Patient   Risks, benefits, and alternatives were discussed: yes     Risks discussed:  Infection, pain and need for additional repair Universal protocol:    Patient identity confirmed:  Verbally with patient Anesthesia:    Anesthesia method:  None Laceration details:    Location:  Scalp   Scalp location:  R parietal   Length (cm):  2.5 Pre-procedure details:    Preparation:  Patient was prepped and draped in usual sterile fashion Exploration:    Imaging obtained: x-ray     Imaging outcome: foreign body not noted     Wound exploration: wound explored through full range of motion     Contaminated: no   Treatment:    Area cleansed with:  Saline   Amount of cleaning:  Standard   Irrigation solution:  Sterile saline Skin repair:    Repair method:  Staples   Number of staples:   1 Approximation:    Approximation:  Loose Repair type:    Repair type:  Simple Post-procedure details:    Dressing:  Open (no dressing)   Procedure completion:  Tolerated     Medications Ordered in ED Medications  HYDROcodone-acetaminophen (NORCO/VICODIN) 5-325 MG per tablet 1 tablet (1 tablet Oral Given 09/26/22 1847)  fentaNYL (SUBLIMAZE) injection 50 mcg (50 mcg Intravenous Given 09/26/22 1748)    ED Course/ Medical Decision Making/ A&P Clinical Course as of 09/26/22 2147  Thu Sep 26, 2022  1613 Stable L2 fall [CC]    Clinical Course User Index [CC]  Glyn Ade, MD                             Medical Decision Making Amount and/or Complexity of Data Reviewed Labs: ordered. Radiology: ordered.  Risk Prescription drug management.   This patient presents to the ED for concern of level 2 trauma- fall on blood thinners, this involves an extensive number of treatment options, and is a complaint that carries with it a high risk of complications and morbidity.  The differential diagnosis includes but not limited to intracranial injury, skull fracture, cervical fracture, right arm fracture/contusion/strain    Co morbidities that complicate the patient evaluation  Hypertension, GERD, hyperlipidemia, MGUS, depression, low-grade B-cell lymphoproliferative disease, PE on Eliquis, CKD, PVD, CLL.  Recent right arm fracture with plate   Additional history obtained:  Additional history obtained from EMS who contributes to history as above External records from outside source obtained and reviewed including prior labs on file for comparison   Lab Tests:  I Ordered, and personally interpreted labs.  The pertinent results include: CBC with WBC 15.5, BMP with creatinine 1.23, slight increase from prior.   Imaging Studies ordered:  I ordered imaging studies including CT head, C-spine, x-ray right wrist, left hand, right forearm. I independently visualized and interpreted  imaging which showed hardware intact, radiology questions potential for fracture to your proximal right forearm screw better visualized on forearm series compared to wrist series I agree with the radiologist interpretation   Consultations Obtained:  I requested consultation with the ER attending, Dr. Hyacinth Meeker,  and discussed lab and imaging findings as well as pertinent plan - they recommend: Who has seen the patient and agrees with plan of care   Problem List / ED Course / Critical interventions / Medication management  82 year old female brought in by EMS after mechanical fall down 2 steps.  States that she has had several falls recently.  Is on Eliquis, arrives as a level 2 trauma.  CT head and C-spine without acute significant injury.  Right parietal scalp laceration was irrigated, closed with 1 staple.  Left hand skin tear, dressed with Tegaderm.  Patient initially with right wrist pain, right wrist series was unremarkable.  She was placed in a cock-up splint however continued to have pain which seem to localize more proximally in the forearm.  An x-ray of her right forearm was obtained which showed concern for possible fracture near her screw which was not previously appreciated on the wrist films.  Patient is placed in a short arm OCL splint, advised to follow-up with her orthopedic surgeon. Patient has 2 rings on her left 4th finger that we have been unable to remove. Patient refuses to allow to have the rings cut. Her finger is swollen from multiple attempts to remove the rings vs trauma from the fall. Advised patient the rings should be removed to avoid loss of circulation to the finger which could result in loss of the finger. Patient verbalizes, understanding, would rather take the risk of losing her finger than having the rings cut off.  I ordered medication including fentanyl, Norco for pain Reevaluation of the patient after these medicines showed that the patient improved I have reviewed  the patients home medicines and have made adjustments as needed   Social Determinants of Health:  Has PCP, orthopedic in [facility] / Admission - Considered:  Stable for discharge with plan to follow-up with her orthopedic surgeon  Final Clinical Impression(s) / ED Diagnoses Final diagnoses:  Fall down steps, initial encounter  Laceration of scalp, initial encounter  Skin tear of left hand without complication, initial encounter  Other closed fracture of proximal end of right radius, initial encounter    Rx / DC Orders ED Discharge Orders     None         Jeannie Fend, PA-C 09/26/22 2107    Jeannie Fend, PA-C 09/26/22 2147    Eber Hong, MD 09/28/22 [phone]

## 2022-09-26 NOTE — Progress Notes (Signed)
Orthopedic Tech Progress Note Patient Details:  EMPRISS VASUDEVAN 1940/10/22 [id number]  Ortho Devices Type of Ortho Device: Short arm splint Ortho Device/Splint Location: RUE Ortho Device/Splint Interventions: Ordered, Application, Adjustment   Post Interventions Patient Tolerated: Vanessa Ralphs 09/26/2022, 8:46 PM

## 2022-09-26 NOTE — ED Notes (Signed)
Taken to CT by Dot Lanes, Paramedic who is primary to this pt.

## 2022-09-26 NOTE — ED Notes (Signed)
X-ray at bedside

## 2022-09-27 DIAGNOSIS — G5601 Carpal tunnel syndrome, right upper limb: Secondary | ICD-10-CM | POA: Diagnosis not present

## 2022-09-27 DIAGNOSIS — S52501D Unspecified fracture of the lower end of right radius, subsequent encounter for closed fracture with routine healing: Secondary | ICD-10-CM | POA: Diagnosis not present

## 2022-09-27 DIAGNOSIS — S52501A Unspecified fracture of the lower end of right radius, initial encounter for closed fracture: Secondary | ICD-10-CM | POA: Diagnosis not present

## 2022-09-27 DIAGNOSIS — S52251D Displaced comminuted fracture of shaft of ulna, right arm, subsequent encounter for closed fracture with routine healing: Secondary | ICD-10-CM | POA: Diagnosis not present

## 2022-09-27 DIAGNOSIS — S52611D Displaced fracture of right ulna styloid process, subsequent encounter for closed fracture with routine healing: Secondary | ICD-10-CM | POA: Diagnosis not present

## 2022-09-27 DIAGNOSIS — Z9889 Other specified postprocedural states: Secondary | ICD-10-CM | POA: Diagnosis not present

## 2022-09-27 LAB — PATHOLOGIST SMEAR REVIEW

## 2022-09-30 DIAGNOSIS — S52301A Unspecified fracture of shaft of right radius, initial encounter for closed fracture: Secondary | ICD-10-CM | POA: Diagnosis not present

## 2022-09-30 DIAGNOSIS — S6411XA Injury of median nerve at wrist and hand level of right arm, initial encounter: Secondary | ICD-10-CM | POA: Diagnosis not present

## 2022-09-30 DIAGNOSIS — G8918 Other acute postprocedural pain: Secondary | ICD-10-CM | POA: Diagnosis not present

## 2022-09-30 DIAGNOSIS — G5601 Carpal tunnel syndrome, right upper limb: Secondary | ICD-10-CM | POA: Diagnosis not present

## 2022-10-01 ENCOUNTER — Telehealth: Payer: Self-pay

## 2022-10-01 NOTE — Telephone Encounter (Signed)
Transition Care Management Follow-up Telephone Call Date of discharge and from where: 09/26/2022 [facility] How have you been since you were released from the hospital? Patient had surgery yesterday 09/30/22 and is recovering. Any questions or concerns? No  Items Reviewed: Did the pt receive and understand the discharge instructions provided? Yes  Medications obtained and verified? Yes  Other? No  Any new allergies since your discharge? No  Dietary orders reviewed? Yes Do you have support at home? Yes   Follow up appointments reviewed:  PCP Hospital f/u appt confirmed? No  Scheduled to see  on  @ . Specialist Hospital f/u appt confirmed? No  Scheduled to see  on  @ . Are transportation arrangements needed? No  If their condition worsens, is the pt aware to call PCP or go to the Emergency Dept.? Yes Was the patient provided with contact information for the PCP's office or ED? Yes Was to pt encouraged to call back with questions or concerns? Yes  Trica Usery [facility]  [facility] Population [facility] Community Resource Care Guide   [email or web address]  ?? [phone]   Website: [email or web address]

## 2022-10-10 DIAGNOSIS — Z0100 Encounter for examination of eyes and vision without abnormal findings: Secondary | ICD-10-CM | POA: Diagnosis not present

## 2022-10-11 DIAGNOSIS — S6411XD Injury of median nerve at wrist and hand level of right arm, subsequent encounter: Secondary | ICD-10-CM | POA: Diagnosis not present

## 2022-10-11 DIAGNOSIS — S52611A Displaced fracture of right ulna styloid process, initial encounter for closed fracture: Secondary | ICD-10-CM | POA: Diagnosis not present

## 2022-10-11 DIAGNOSIS — S72301D Unspecified fracture of shaft of right femur, subsequent encounter for closed fracture with routine healing: Secondary | ICD-10-CM | POA: Diagnosis not present

## 2022-10-11 DIAGNOSIS — S52351D Displaced comminuted fracture of shaft of radius, right arm, subsequent encounter for closed fracture with routine healing: Secondary | ICD-10-CM | POA: Diagnosis not present

## 2022-10-11 DIAGNOSIS — S52611D Displaced fracture of right ulna styloid process, subsequent encounter for closed fracture with routine healing: Secondary | ICD-10-CM | POA: Diagnosis not present

## 2022-10-11 DIAGNOSIS — Z981 Arthrodesis status: Secondary | ICD-10-CM | POA: Diagnosis not present

## 2022-10-11 DIAGNOSIS — S52301D Unspecified fracture of shaft of right radius, subsequent encounter for closed fracture with routine healing: Secondary | ICD-10-CM | POA: Diagnosis not present

## 2022-10-11 DIAGNOSIS — S52591D Other fractures of lower end of right radius, subsequent encounter for closed fracture with routine healing: Secondary | ICD-10-CM | POA: Diagnosis not present

## 2022-10-11 DIAGNOSIS — M19031 Primary osteoarthritis, right wrist: Secondary | ICD-10-CM | POA: Diagnosis not present

## 2022-10-11 DIAGNOSIS — M1811 Unilateral primary osteoarthritis of first carpometacarpal joint, right hand: Secondary | ICD-10-CM | POA: Diagnosis not present

## 2022-10-11 DIAGNOSIS — S52501A Unspecified fracture of the lower end of right radius, initial encounter for closed fracture: Secondary | ICD-10-CM | POA: Diagnosis not present

## 2022-10-14 DIAGNOSIS — M19011 Primary osteoarthritis, right shoulder: Secondary | ICD-10-CM | POA: Diagnosis not present

## 2022-10-14 DIAGNOSIS — R5381 Other malaise: Secondary | ICD-10-CM | POA: Diagnosis not present

## 2022-10-14 DIAGNOSIS — S62101A Fracture of unspecified carpal bone, right wrist, initial encounter for closed fracture: Secondary | ICD-10-CM | POA: Diagnosis not present

## 2022-10-14 DIAGNOSIS — R296 Repeated falls: Secondary | ICD-10-CM | POA: Diagnosis not present

## 2022-10-14 DIAGNOSIS — Z9889 Other specified postprocedural states: Secondary | ICD-10-CM | POA: Diagnosis not present

## 2022-10-14 DIAGNOSIS — Z7409 Other reduced mobility: Secondary | ICD-10-CM | POA: Diagnosis not present

## 2022-10-21 DIAGNOSIS — K224 Dyskinesia of esophagus: Secondary | ICD-10-CM | POA: Diagnosis not present

## 2022-10-21 DIAGNOSIS — K219 Gastro-esophageal reflux disease without esophagitis: Secondary | ICD-10-CM | POA: Diagnosis not present

## 2022-10-28 DIAGNOSIS — H353221 Exudative age-related macular degeneration, left eye, with active choroidal neovascularization: Secondary | ICD-10-CM | POA: Diagnosis not present

## 2022-10-30 DIAGNOSIS — M47816 Spondylosis without myelopathy or radiculopathy, lumbar region: Secondary | ICD-10-CM | POA: Diagnosis not present

## 2022-10-30 DIAGNOSIS — Z6841 Body Mass Index (BMI) 40.0 and over, adult: Secondary | ICD-10-CM | POA: Diagnosis not present

## 2022-11-04 DIAGNOSIS — R296 Repeated falls: Secondary | ICD-10-CM | POA: Diagnosis not present

## 2022-11-04 DIAGNOSIS — R5381 Other malaise: Secondary | ICD-10-CM | POA: Diagnosis not present

## 2022-11-04 DIAGNOSIS — Z7409 Other reduced mobility: Secondary | ICD-10-CM | POA: Diagnosis not present

## 2022-11-08 DIAGNOSIS — Z4789 Encounter for other orthopedic aftercare: Secondary | ICD-10-CM | POA: Diagnosis not present

## 2022-11-08 DIAGNOSIS — Z9889 Other specified postprocedural states: Secondary | ICD-10-CM | POA: Diagnosis not present

## 2022-11-08 DIAGNOSIS — Z8781 Personal history of (healed) traumatic fracture: Secondary | ICD-10-CM | POA: Diagnosis not present

## 2022-11-08 DIAGNOSIS — S52251D Displaced comminuted fracture of shaft of ulna, right arm, subsequent encounter for closed fracture with routine healing: Secondary | ICD-10-CM | POA: Diagnosis not present

## 2022-11-08 DIAGNOSIS — S52501D Unspecified fracture of the lower end of right radius, subsequent encounter for closed fracture with routine healing: Secondary | ICD-10-CM | POA: Diagnosis not present

## 2022-11-11 DIAGNOSIS — M47816 Spondylosis without myelopathy or radiculopathy, lumbar region: Secondary | ICD-10-CM | POA: Diagnosis not present

## 2022-11-13 DIAGNOSIS — R296 Repeated falls: Secondary | ICD-10-CM | POA: Diagnosis not present

## 2022-11-13 DIAGNOSIS — Z7409 Other reduced mobility: Secondary | ICD-10-CM | POA: Diagnosis not present

## 2022-11-13 DIAGNOSIS — R5381 Other malaise: Secondary | ICD-10-CM | POA: Diagnosis not present

## 2022-11-18 ENCOUNTER — Inpatient Hospital Stay: Payer: Medicare PPO | Attending: Internal Medicine | Admitting: Internal Medicine

## 2022-11-18 ENCOUNTER — Encounter: Payer: Self-pay | Admitting: Internal Medicine

## 2022-11-18 ENCOUNTER — Inpatient Hospital Stay: Payer: Medicare PPO

## 2022-11-18 VITALS — BP 76/61 | HR 77 | Temp 94.9°F | Ht 64.0 in | Wt 236.0 lb

## 2022-11-18 DIAGNOSIS — Z86718 Personal history of other venous thrombosis and embolism: Secondary | ICD-10-CM | POA: Diagnosis not present

## 2022-11-18 DIAGNOSIS — M2550 Pain in unspecified joint: Secondary | ICD-10-CM | POA: Insufficient documentation

## 2022-11-18 DIAGNOSIS — I959 Hypotension, unspecified: Secondary | ICD-10-CM | POA: Diagnosis not present

## 2022-11-18 DIAGNOSIS — N189 Chronic kidney disease, unspecified: Secondary | ICD-10-CM | POA: Diagnosis not present

## 2022-11-18 DIAGNOSIS — G4733 Obstructive sleep apnea (adult) (pediatric): Secondary | ICD-10-CM | POA: Insufficient documentation

## 2022-11-18 DIAGNOSIS — R5381 Other malaise: Secondary | ICD-10-CM | POA: Diagnosis not present

## 2022-11-18 DIAGNOSIS — Z86711 Personal history of pulmonary embolism: Secondary | ICD-10-CM | POA: Insufficient documentation

## 2022-11-18 DIAGNOSIS — I739 Peripheral vascular disease, unspecified: Secondary | ICD-10-CM | POA: Diagnosis not present

## 2022-11-18 DIAGNOSIS — R296 Repeated falls: Secondary | ICD-10-CM | POA: Diagnosis not present

## 2022-11-18 DIAGNOSIS — Z803 Family history of malignant neoplasm of breast: Secondary | ICD-10-CM | POA: Insufficient documentation

## 2022-11-18 DIAGNOSIS — C9111 Chronic lymphocytic leukemia of B-cell type in remission: Secondary | ICD-10-CM | POA: Diagnosis not present

## 2022-11-18 DIAGNOSIS — Z79899 Other long term (current) drug therapy: Secondary | ICD-10-CM | POA: Diagnosis not present

## 2022-11-18 DIAGNOSIS — C9110 Chronic lymphocytic leukemia of B-cell type not having achieved remission: Secondary | ICD-10-CM

## 2022-11-18 DIAGNOSIS — E7800 Pure hypercholesterolemia, unspecified: Secondary | ICD-10-CM | POA: Diagnosis not present

## 2022-11-18 DIAGNOSIS — I129 Hypertensive chronic kidney disease with stage 1 through stage 4 chronic kidney disease, or unspecified chronic kidney disease: Secondary | ICD-10-CM | POA: Insufficient documentation

## 2022-11-18 DIAGNOSIS — Z7901 Long term (current) use of anticoagulants: Secondary | ICD-10-CM | POA: Diagnosis not present

## 2022-11-18 DIAGNOSIS — K219 Gastro-esophageal reflux disease without esophagitis: Secondary | ICD-10-CM | POA: Diagnosis not present

## 2022-11-18 DIAGNOSIS — Z7409 Other reduced mobility: Secondary | ICD-10-CM | POA: Diagnosis not present

## 2022-11-18 DIAGNOSIS — R531 Weakness: Secondary | ICD-10-CM | POA: Diagnosis not present

## 2022-11-18 DIAGNOSIS — R5383 Other fatigue: Secondary | ICD-10-CM | POA: Diagnosis not present

## 2022-11-18 LAB — CBC WITH DIFFERENTIAL/PLATELET
Abs Immature Granulocytes: 0.02 10*3/uL (ref 0.00–0.07)
Basophils Absolute: 0.1 10*3/uL (ref 0.0–0.1)
Basophils Relative: 1 %
Eosinophils Absolute: 0.3 10*3/uL (ref 0.0–0.5)
Eosinophils Relative: 2 %
HCT: 39 % (ref 36.0–46.0)
Hemoglobin: 12.4 g/dL (ref 12.0–15.0)
Immature Granulocytes: 0 %
Lymphocytes Relative: 62 %
Lymphs Abs: 7.5 10*3/uL — ABNORMAL HIGH (ref 0.7–4.0)
MCH: 29.7 pg (ref 26.0–34.0)
MCHC: 31.8 g/dL (ref 30.0–36.0)
MCV: 93.5 fL (ref 80.0–100.0)
Monocytes Absolute: 0.6 10*3/uL (ref 0.1–1.0)
Monocytes Relative: 5 %
Neutro Abs: 3.6 10*3/uL (ref 1.7–7.7)
Neutrophils Relative %: 30 %
Platelets: 270 10*3/uL (ref 150–400)
RBC: 4.17 MIL/uL (ref 3.87–5.11)
RDW: 15.5 % (ref 11.5–15.5)
Smear Review: NORMAL
WBC: 12.1 10*3/uL — ABNORMAL HIGH (ref 4.0–10.5)
nRBC: 0 % (ref 0.0–0.2)

## 2022-11-18 LAB — COMPREHENSIVE METABOLIC PANEL
ALT: 11 U/L (ref 0–44)
AST: 19 U/L (ref 15–41)
Albumin: 3.7 g/dL (ref 3.5–5.0)
Alkaline Phosphatase: 92 U/L (ref 38–126)
Anion gap: 9 (ref 5–15)
BUN: 20 mg/dL (ref 8–23)
CO2: 28 mmol/L (ref 22–32)
Calcium: 9 mg/dL (ref 8.9–10.3)
Chloride: 105 mmol/L (ref 98–111)
Creatinine, Ser: 0.97 mg/dL (ref 0.44–1.00)
GFR, Estimated: 59 mL/min — ABNORMAL LOW (ref >60)
Glucose, Bld: 115 mg/dL — ABNORMAL HIGH (ref 70–99)
Potassium: 3.9 mmol/L (ref 3.5–5.1)
Sodium: 142 mmol/L (ref 135–145)
Total Bilirubin: 0.5 mg/dL (ref 0.3–1.2)
Total Protein: 6.2 g/dL — ABNORMAL LOW (ref 6.5–8.1)

## 2022-11-18 LAB — LACTATE DEHYDROGENASE: LDH: 169 U/L (ref 98–192)

## 2022-11-18 NOTE — Progress Notes (Signed)
No concerns today 

## 2022-11-18 NOTE — Patient Instructions (Signed)
#   recommend HOLDING losartan- hydrochlorothiazide; propranolol; and speak to PCP ASAP re: low Blood pressures.

## 2022-11-18 NOTE — Assessment & Plan Note (Addendum)
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  Continue Eliquis- Refill Eliquis with Dr.Anderson.  No concerns for bleeding.stable.    # Chronic low-grade B-cell lymphoproliferative disorder/CLL-hemoglobin platelets normal.  Platelets slightly elevated 14 absolute lymphocyte count 4-5,000.  Asymptomatic/continue surveillance.stable.    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 JAN 2024- 0.4gm/dl; K/l=5-  Pending today. Again repeat in 12 months.stable.    # fatigue-history of obstructive sleep apnea/continue CPAP.stable.    # DISPOSITION: # follow up in 12  months- MD labs-[ cbc/cmp/ldh/MM panel; K-light chains]-Dr.B

## 2022-11-18 NOTE — Progress Notes (Signed)
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 S/p IV Filter explantation.    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  2) CD56 expression on monocytes.   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  --------------------------------------------------------  DIAGNOSIS: CLL  STAGE: I        ;GOALS: COntrol  CURRENT/MOST RECENT THERAPY: surveillaince    CLL (chronic lymphocytic leukemia) (HCC)     INTERVAL HISTORY: Alone.  Ambulating with a rolling walker.   A pleasant 82 year-old female patient with above history of History of CLL- on surveillance; and history of bilateral PE- on Eliquis is here for follow-up.  Patient unfortunately had couple of mechanical falls -s/p fracture of the forearm.  Patient needed surgery currently in the brace.  Patient continues to have chronic joint pains back pain.  Not any worse.  She denies any blood in stools or black or stools.    No night sweats.  No fevers.  No new lumps or bumps.  Chronic fatigue.  Review of Systems  Constitutional:  Positive for malaise/fatigue. Negative for chills,  diaphoresis, fever and weight loss.  HENT:  Negative for nosebleeds and sore throat.   Eyes:  Negative for double vision.  Respiratory:  Negative for cough, hemoptysis, sputum production, shortness of breath and wheezing.   Cardiovascular:  Negative for chest pain, palpitations, orthopnea and leg swelling.  Gastrointestinal:  Negative for abdominal pain, blood in stool, constipation, diarrhea, heartburn, melena, nausea and vomiting.  Genitourinary:  Negative for dysuria, frequency and urgency.  Musculoskeletal:  Positive for back pain, joint pain and neck pain.  Skin: Negative.  Negative for itching and rash.  Neurological:  Negative for dizziness, tingling, focal weakness, weakness and headaches.  Endo/Heme/Allergies:  Does not bruise/bleed easily.  Psychiatric/Behavioral:  Negative for depression. The patient is not nervous/anxious and does not have insomnia.     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)    Macular degeneration    MGUS (monoclonal gammopathy of unknown significance)    Morbid obesity (HCC)    Obesity    Pulmonary embolism (HCC)    PVD (peripheral vascular disease) (HCC)    Sleep apnea  CPAP    PAST SURGICAL HISTORY :   Past Surgical History:  Procedure Laterality Date   APPENDECTOMY     BREAST BIOPSY Left 08/19/2004   lt bx/clip-neg   CATARACT EXTRACTION W/PHACO Left 09/10/2022   Procedure: CATARACT EXTRACTION PHACO AND INTRAOCULAR LENS PLACEMENT (IOC) LEFT  5.88  00:31.9;  Surgeon: Galen Manila, MD;  Location:  [facility];  Service: Ophthalmology;  Laterality: Left;  sleep apnea   COLONOSCOPY     COLONOSCOPY WITH PROPOFOL N/A 03/10/2017   Procedure: COLONOSCOPY WITH PROPOFOL;  Surgeon: Christena Deem, MD;  Location: [facility] END[facility];  Service: Endoscopy;  Laterality: N/A;   ESOPHAGOGASTRODUODENOSCOPY (EGD) WITH PROPOFOL N/A 02/02/2019   Procedure: ESOPHAGOGASTRODUODENOSCOPY (EGD) WITH PROPOFOL;  Surgeon: Toledo, Boykin Nearing, MD;  Location: [facility] ENDOSCOPY;  Service: Endoscopy;  Laterality: N/A;   IVC FILTER INSERTION N/A 06/11/2016   Procedure: IVC Filter Insertion;  Surgeon: Renford Dills, MD;  Location: [facility] INVASIVE CV LAB;  Service: Cardiovascular;  Laterality: N/A;   IVC FILTER REMOVAL N/A 05/06/2017   Procedure: IVC FILTER REMOVAL;  Surgeon: Renford Dills, MD;  Location: [facility] INVASIVE CV LAB;  Service: Cardiovascular;  Laterality: N/A;   JOINT REPLACEMENT Left 06/17/2016   Knees, pt stated left and right have been replaced.   LAPAROSCOPIC APPENDECTOMY N/A 10/10/2018   Procedure: APPENDECTOMY LAPAROSCOPIC ATTEMPTED CONVERTED TO OPEN;  Surgeon: Carolan Shiver, MD;  Location: [facility] ORS;  Service: General;  Laterality: N/A;   LAPAROSCOPY N/A 10/10/2018   Procedure: LAPAROSCOPY DIAGNOSTIC ATTEMPTED CONVERTED TO OPEN;  Surgeon: Carolan Shiver, MD;  Location: [facility] ORS;  Service: General;  Laterality: N/A;   LUMBAR LAMINECTOMY/DECOMPRESSION MICRODISCECTOMY N/A 07/09/2021   Procedure: Laminectomy and Foraminotomy - Lumbar Two-Lumbar Three - Lumbar Three-Lumbar Four - Lumbar Four-Lumbar Five;  Surgeon: Julio Sicks, MD;  Location: [facility] OR;  Service: Neurosurgery;  Laterality: N/A;   neck fusion     PARTIAL COLECTOMY Right 10/10/2018   Procedure: PARTIAL COLECTOMY;  Surgeon: Carolan Shiver, MD;  Location: [facility] ORS;  Service: General;  Laterality: Right;   PERIPHERAL VASCULAR CATHETERIZATION N/A 08/15/2015   Procedure: pulmonary angiogram with lysis;  Surgeon: Renford Dills, MD;  Location: [facility] INVASIVE CV LAB;  Service: Cardiovascular;  Laterality: N/A;   WRIST SURGERY Left 2000    FAMILY HISTORY :   Family History  Problem Relation Age of Onset   Pulmonary embolism Mother    Sudden death Mother    Obesity Mother    Heart failure Father    Congestive Heart Failure Father    Sudden death Father    Thyroid cancer Brother    Breast cancer Neg Hx     SOCIAL HISTORY:   Social History   Tobacco Use   Smoking status: Never   Smokeless tobacco: Never  Vaping Use   Vaping status: Never Used  Substance Use Topics   Alcohol use: No    Alcohol/week: 0.0 standard drinks of alcohol   Drug use: No    ALLERGIES:  has No Known Allergies.  MEDICATIONS:  Current Outpatient Medications  Medication Sig Dispense Refill   acetaminophen (TYLENOL) 650 MG CR tablet Take 650-1,300 mg by mouth every 8 (eight) hours as needed for pain. TYLENOL ARTHRITIS     apixaban (ELIQUIS) 5 MG TABS tablet Take 1 tablet (5 mg total) by mouth 2 (two) times daily. 180 tablet 3   b complex vitamins capsule Take 1 capsule by mouth daily.     calcium carbonate (OSCAL) 1500 (600 Ca) MG  TABS tablet Take 1,500 mg by mouth 2 (two) times daily with a meal.     Cholecalciferol (VITAMIN D-3) 125 MCG (5000 UT) TABS Take by mouth daily.     cholestyramine (QUESTRAN) 4 g packet Take 1 packet (4 g total) by mouth 3 (three) times daily with meals. (Patient taking differently: Take 4 g by mouth 3 (three) times daily as needed.) 60 each 0   DULoxetine (CYMBALTA) 60 MG capsule TAKE 1 CAPSULE (60 MG TOTAL) BY MOUTH DAILY. 90 capsule 3   escitalopram (LEXAPRO) 20 MG tablet Take 1 tablet by mouth daily.     gabapentin (NEURONTIN) 400 MG capsule Take 1 capsule (400 mg total) by mouth 4 (four) times daily. Take 1 pill in Am and 2 pills at night- for nerve pain 360 capsule 1   HYDROcodone-acetaminophen (NORCO) 5-325 MG tablet Take 1 tablet by mouth every 6 (six) hours as needed for moderate pain. 120  tablet 0   losartan-hydrochlorothiazide (HYZAAR) 100-12.5 MG tablet Take 0.5 tablets by mouth daily. Reported on 08/22/2015 30 tablet    magnesium gluconate (MAGONATE) 500 MG tablet Take 500 mg by mouth 2 (two) times daily.     methocarbamol (ROBAXIN) 500 MG tablet TAKE 1 TABLET (500 MG TOTAL) BY MOUTH EVERY 8 (EIGHT) HOURS AS NEEDED FOR MUSCLE SPASMS. 180 tablet 5   Multiple Vitamins-Minerals (PRESERVISION AREDS 2 PO) Take 1 tablet by mouth in the morning and at bedtime.     pravastatin (PRAVACHOL) 40 MG tablet Take 1 tablet by mouth daily.     propranolol (INDERAL) 40 MG tablet Take 40 mg by mouth daily.     sucralfate (CARAFATE) 1 g tablet Take 1 g by mouth daily.     TURMERIC CURCUMIN PO Take 1 capsule by mouth in the morning and at bedtime.     No current facility-administered medications for this visit.    PHYSICAL EXAMINATION:   BP (!) 76/61 (BP Location: Left Arm, Patient Position: Sitting, Cuff Size: Large)   Pulse 77   Temp (!) 94.9 F (34.9 C) (Tympanic)   Ht 5\' 4"  (1.626 m)   Wt 236 lb (107 kg)   SpO2 98%   BMI 40.51 kg/m   Filed Weights   11/18/22 1008  Weight: 236 lb (107 kg)     Physical Exam Constitutional:      Comments: Obese.  Walks by herself.  HENT:     Head: Normocephalic and atraumatic.     Mouth/Throat:     Pharynx: No oropharyngeal exudate.  Eyes:     Pupils: Pupils are equal, round, and reactive to light.  Cardiovascular:     Rate and Rhythm: Normal rate and regular rhythm.  Pulmonary:     Effort: No respiratory distress.     Breath sounds: No wheezing.  Abdominal:     General: Bowel sounds are normal. There is no distension.     Palpations: Abdomen is soft. There is no mass.     Tenderness: There is no abdominal tenderness. There is no guarding or rebound.  Musculoskeletal:        General: No tenderness. Normal range of motion.     Cervical back: Normal range of motion and neck supple.  Skin:    General: Skin is warm.     Comments:  Chronic bruises.  Neurological:     Mental Status: She is alert and oriented to person, place, and time.  Psychiatric:        Mood and Affect: Affect  normal.      LABORATORY DATA:  I have reviewed the data as listed    Component Value Date/Time   NA 142 09/26/2022 1542   NA 142 12/26/2020 1112   NA 141 04/29/2012 1822   K 4.1 09/26/2022 1542   K 3.9 04/29/2012 1822   CL 105 09/26/2022 1542   CL 107 04/29/2012 1822   CO2 28 09/26/2022 1542   CO2 28 04/29/2012 1822   GLUCOSE 125 (H) 09/26/2022 1542   GLUCOSE 129 (H) 04/29/2012 1822   BUN 16 09/26/2022 1542   BUN 15 12/26/2020 1112   BUN 21 (H) 04/29/2012 1822   CREATININE 1.23 (H) 09/26/2022 1542   CREATININE 1.02 (H) 07/28/2014 0950   CALCIUM 8.9 09/26/2022 1542   CALCIUM 8.7 04/29/2012 1822   PROT 6.6 05/20/2022 1000   PROT 5.8 (L) 12/26/2020 1112   PROT 7.1 04/29/2012 1822   ALBUMIN 3.6 05/20/2022 1000   ALBUMIN 4.0 12/26/2020 1112   ALBUMIN 3.3 (L) 04/29/2012 1822   AST 18 05/20/2022 1000   AST 20 04/29/2012 1822   ALT 10 05/20/2022 1000   ALT 33 04/29/2012 1822   ALKPHOS 93 05/20/2022 1000   ALKPHOS 169 (H) 04/29/2012 1822   BILITOT 0.4 05/20/2022 1000   BILITOT 0.3 12/26/2020 1112   BILITOT 0.3 04/29/2012 1822   GFRNONAA 44 (L) 09/26/2022 1542   GFRNONAA 55 (L) 07/28/2014 0950   GFRAA 55 (L) 09/03/2019 0830   GFRAA >60 07/28/2014 0950    No results found for: "SPEP", "UPEP"  Lab Results  Component Value Date   WBC 12.1 (H) 11/18/2022   NEUTROABS PENDING 11/18/2022   HGB 12.4 11/18/2022   HCT 39.0 11/18/2022   MCV 93.5 11/18/2022   PLT 270 11/18/2022      Chemistry      Component Value Date/Time   NA 142 09/26/2022 1542   NA 142 12/26/2020 1112   NA 141 04/29/2012 1822   K 4.1 09/26/2022 1542   K 3.9 04/29/2012 1822   CL 105 09/26/2022 1542   CL 107 04/29/2012 1822   CO2 28 09/26/2022 1542   CO2 28 04/29/2012 1822   BUN 16 09/26/2022 1542   BUN 15 12/26/2020 1112   BUN 21 (H) 04/29/2012  1822   CREATININE 1.23 (H) 09/26/2022 1542   CREATININE 1.02 (H) 07/28/2014 0950      Component Value Date/Time   CALCIUM 8.9 09/26/2022 1542   CALCIUM 8.7 04/29/2012 1822   ALKPHOS 93 05/20/2022 1000   ALKPHOS 169 (H) 04/29/2012 1822   AST 18 05/20/2022 1000   AST 20 04/29/2012 1822   ALT 10 05/20/2022 1000   ALT 33 04/29/2012 1822   BILITOT 0.4 05/20/2022 1000   BILITOT 0.3 12/26/2020 1112   BILITOT 0.3 04/29/2012 1822        ASSESSMENT & PLAN:   CLL (chronic lymphocytic leukemia) (HCC) # Massive bilateral Pulmonary Embolus-on indefinite anticoagulation.  Continue Eliquis- Refill Eliquis with Dr.Anderson.  No concerns for bleeding.stable.    # Chronic low-grade B-cell lymphoproliferative disorder/CLL-hemoglobin platelets normal.  Platelets slightly elevated 14 absolute lymphocyte count 4-5,000.  Asymptomatic/continue surveillance.stable.    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 JAN 2024- 0.4gm/dl; K/l=5-  Pending today. Again repeat in 12 months.stable.    # fatigue-history of obstructive sleep apnea/continue CPAP.stable.    # DISPOSITION: # follow  up in 12  months- MD labs-[ cbc/cmp/ldh/MM panel; K-light chains]-Dr.B    Earna Coder, MD 11/18/2022 10:55 AM

## 2022-11-19 DIAGNOSIS — I129 Hypertensive chronic kidney disease with stage 1 through stage 4 chronic kidney disease, or unspecified chronic kidney disease: Secondary | ICD-10-CM | POA: Diagnosis not present

## 2022-11-19 DIAGNOSIS — N1831 Chronic kidney disease, stage 3a: Secondary | ICD-10-CM | POA: Diagnosis not present

## 2022-11-20 DIAGNOSIS — R5381 Other malaise: Secondary | ICD-10-CM | POA: Diagnosis not present

## 2022-11-20 DIAGNOSIS — R296 Repeated falls: Secondary | ICD-10-CM | POA: Diagnosis not present

## 2022-11-20 DIAGNOSIS — Z7409 Other reduced mobility: Secondary | ICD-10-CM | POA: Diagnosis not present

## 2022-11-22 DIAGNOSIS — K2289 Other specified disease of esophagus: Secondary | ICD-10-CM | POA: Diagnosis not present

## 2022-11-22 DIAGNOSIS — R1310 Dysphagia, unspecified: Secondary | ICD-10-CM | POA: Diagnosis not present

## 2022-11-22 DIAGNOSIS — K224 Dyskinesia of esophagus: Secondary | ICD-10-CM | POA: Diagnosis not present

## 2022-11-26 DIAGNOSIS — R296 Repeated falls: Secondary | ICD-10-CM | POA: Diagnosis not present

## 2022-11-26 DIAGNOSIS — R5381 Other malaise: Secondary | ICD-10-CM | POA: Diagnosis not present

## 2022-11-26 DIAGNOSIS — Z7409 Other reduced mobility: Secondary | ICD-10-CM | POA: Diagnosis not present

## 2022-12-02 DIAGNOSIS — M47816 Spondylosis without myelopathy or radiculopathy, lumbar region: Secondary | ICD-10-CM | POA: Diagnosis not present

## 2022-12-04 DIAGNOSIS — R5381 Other malaise: Secondary | ICD-10-CM | POA: Diagnosis not present

## 2022-12-04 DIAGNOSIS — R296 Repeated falls: Secondary | ICD-10-CM | POA: Diagnosis not present

## 2022-12-04 DIAGNOSIS — Z7409 Other reduced mobility: Secondary | ICD-10-CM | POA: Diagnosis not present

## 2022-12-09 DIAGNOSIS — R296 Repeated falls: Secondary | ICD-10-CM | POA: Diagnosis not present

## 2022-12-09 DIAGNOSIS — Z7409 Other reduced mobility: Secondary | ICD-10-CM | POA: Diagnosis not present

## 2022-12-09 DIAGNOSIS — R5381 Other malaise: Secondary | ICD-10-CM | POA: Diagnosis not present

## 2022-12-10 DIAGNOSIS — Z967 Presence of other bone and tendon implants: Secondary | ICD-10-CM | POA: Diagnosis not present

## 2022-12-10 DIAGNOSIS — Z9889 Other specified postprocedural states: Secondary | ICD-10-CM | POA: Diagnosis not present

## 2022-12-10 DIAGNOSIS — S52611D Displaced fracture of right ulna styloid process, subsequent encounter for closed fracture with routine healing: Secondary | ICD-10-CM | POA: Diagnosis not present

## 2022-12-10 DIAGNOSIS — S52501D Unspecified fracture of the lower end of right radius, subsequent encounter for closed fracture with routine healing: Secondary | ICD-10-CM | POA: Diagnosis not present

## 2022-12-11 DIAGNOSIS — R296 Repeated falls: Secondary | ICD-10-CM | POA: Diagnosis not present

## 2022-12-11 DIAGNOSIS — Z7409 Other reduced mobility: Secondary | ICD-10-CM | POA: Diagnosis not present

## 2022-12-11 DIAGNOSIS — R5381 Other malaise: Secondary | ICD-10-CM | POA: Diagnosis not present

## 2022-12-16 DIAGNOSIS — T8484XA Pain due to internal orthopedic prosthetic devices, implants and grafts, initial encounter: Secondary | ICD-10-CM | POA: Diagnosis not present

## 2022-12-16 DIAGNOSIS — G8918 Other acute postprocedural pain: Secondary | ICD-10-CM | POA: Diagnosis not present

## 2022-12-27 DIAGNOSIS — S52571D Other intraarticular fracture of lower end of right radius, subsequent encounter for closed fracture with routine healing: Secondary | ICD-10-CM | POA: Diagnosis not present

## 2022-12-27 DIAGNOSIS — S52611D Displaced fracture of right ulna styloid process, subsequent encounter for closed fracture with routine healing: Secondary | ICD-10-CM | POA: Diagnosis not present

## 2022-12-27 DIAGNOSIS — T8484XA Pain due to internal orthopedic prosthetic devices, implants and grafts, initial encounter: Secondary | ICD-10-CM | POA: Diagnosis not present

## 2022-12-27 DIAGNOSIS — S52501A Unspecified fracture of the lower end of right radius, initial encounter for closed fracture: Secondary | ICD-10-CM | POA: Diagnosis not present

## 2022-12-27 DIAGNOSIS — S52571A Other intraarticular fracture of lower end of right radius, initial encounter for closed fracture: Secondary | ICD-10-CM | POA: Diagnosis not present

## 2022-12-27 DIAGNOSIS — Z472 Encounter for removal of internal fixation device: Secondary | ICD-10-CM | POA: Diagnosis not present

## 2022-12-27 DIAGNOSIS — S52611A Displaced fracture of right ulna styloid process, initial encounter for closed fracture: Secondary | ICD-10-CM | POA: Diagnosis not present

## 2022-12-27 DIAGNOSIS — Z96698 Presence of other orthopedic joint implants: Secondary | ICD-10-CM | POA: Diagnosis not present

## 2022-12-30 ENCOUNTER — Encounter: Payer: Self-pay | Admitting: Physical Medicine and Rehabilitation

## 2022-12-30 ENCOUNTER — Encounter: Payer: Medicare PPO | Attending: Physical Medicine and Rehabilitation | Admitting: Physical Medicine and Rehabilitation

## 2022-12-30 VITALS — BP 151/84 | HR 56 | Ht 64.0 in | Wt 241.0 lb

## 2022-12-30 DIAGNOSIS — M5417 Radiculopathy, lumbosacral region: Secondary | ICD-10-CM | POA: Diagnosis not present

## 2022-12-30 DIAGNOSIS — Z6841 Body Mass Index (BMI) 40.0 and over, adult: Secondary | ICD-10-CM

## 2022-12-30 DIAGNOSIS — R269 Unspecified abnormalities of gait and mobility: Secondary | ICD-10-CM | POA: Diagnosis not present

## 2022-12-30 DIAGNOSIS — M533 Sacrococcygeal disorders, not elsewhere classified: Secondary | ICD-10-CM | POA: Diagnosis not present

## 2022-12-30 DIAGNOSIS — M792 Neuralgia and neuritis, unspecified: Secondary | ICD-10-CM | POA: Insufficient documentation

## 2022-12-30 MED ORDER — HYDROCODONE-ACETAMINOPHEN 10-325 MG PO TABS: 1.0000 | ORAL_TABLET | Freq: Four times a day (QID) | ORAL | 0 refills | Status: DC | PRN

## 2022-12-30 NOTE — Progress Notes (Signed)
Subjective:    Patient ID: Jamie Phillips, female    DOB: Feb 01, 1941, 82 y.o.   MRN: [id number]  HPI  Pt is a 82 yr old female with BMI of 41, CLL; hx of Bell's palsy; central lumbar stenosis and radiculopathy- hx of Lumbar lami L2-L5; HTN- but not lately. 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  Here for f/u on chronic pain.   Had one of plates removed in R wrist- but left one "forever".    Dr Jarold Motto said tendons issues in RUE  Going to start OT this Thursday for R hand/wrist.    Got injections by Dr Dutch Quint- but back still hurts in the same place.  Didn't improve "any" with injections.  Couldn't make appointment last week with someone. Doesn't know who it was.  Redgie Grayer- missed appointment- at [facility]- for pain.    Got Pain meds 3 moths ago- not sure if help or not.  Didn't call since "they didn't help". Doesn't feel like helped at all- thinks extra strength tylenol arthritis helps the same amount.  Sometimes sits and just cries.   Feel useless. Like cannot accomplish anything.   Hasn't worn makeup for ~4 months-  Was losing weight- but has put back on 4 lbs.  Not moving as much.        Pain Inventory Average Pain 6 Pain Right Now 6 My pain is constant, sharp, burning, stabbing, tingling, and aching  In the last 24 hours, has pain interfered with the following? General activity 7 Relation with others 7 Enjoyment of life 7 What TIME of day is your pain at its worst? varies Sleep (in general) Fair  Pain is worse with: walking and bending Pain improves with: heat/ice Relief from Meds: 1  Family History  Problem Relation Age of Onset   Pulmonary embolism Mother    Sudden death Mother    Obesity Mother    Heart failure Father    Congestive Heart Failure Father    Sudden death Father    Thyroid cancer Brother    Breast cancer Neg Hx    Social History   Socioeconomic History   Marital  status: Widowed    Spouse name: Not on file   Number of children: Not on file   Years of education: Not on file   Highest education level: Not on file  Occupational History   Occupation: Retired  Tobacco Use   Smoking status: Never   Smokeless tobacco: Never  Vaping Use   Vaping status: Never Used  Substance and Sexual Activity   Alcohol use: No    Alcohol/week: 0.0 standard drinks of alcohol   Drug use: No   Sexual activity: Not on file  Other Topics Concern   Not on file  Social History Narrative   ** Merged History Encounter **       Social Determinants of Health   Financial Resource Strain: Unknown (03/18/2017)   Received from [facility], [facility]   Overall Financial Resource Strain (CARDIA)    Difficulty of Paying Living Expenses: Patient declined  Food Insecurity: Unknown (03/18/2017)   Received from [facility], [facility]   Hunger Vital Sign    Worried About Running Out of Food in the Last Year: Patient declined    Ran Out of Food in the Last Year: Patient declined  Transportation Needs: Unknown (03/18/2017)  Received from [facility], [facility]   [facility] - Transportation    Lack of Transportation (Medical): Patient declined    Lack of Transportation (Non-Medical): Patient declined  Physical Activity: Unknown (03/18/2017)   Received from [facility], [facility]   Exercise Vital Sign    Days of Exercise per Week: Patient declined    Minutes of Exercise per Session: Patient declined  Stress: Unknown (03/18/2017)   Received from [facility], [facility]   [employer] [facility] - Occupational Stress Questionnaire    Feeling of Stress : Patient declined  Social Connections: Unknown (03/18/2017)   Received from [facility], [facility]   Social Connection and Isolation Panel [NHANES]    Frequency of Communication with Friends and Family: Patient declined    Frequency of Social Gatherings with Friends and Family: Patient declined    Attends Religious Services: Patient declined    Active Member of Clubs or Organizations: Patient declined    Attends Banker Meetings: Patient declined    Marital Status: Patient declined   Past Surgical History:  Procedure Laterality Date   APPENDECTOMY     BREAST BIOPSY Left 08/19/2004   lt bx/clip-neg   CATARACT EXTRACTION W/PHACO Left 09/10/2022   Procedure: CATARACT EXTRACTION PHACO AND INTRAOCULAR LENS PLACEMENT (IOC) LEFT  5.88  00:31.9;  Surgeon: Galen Manila, MD;  Location: [facility];  Service: Ophthalmology;  Laterality: Left;  sleep apnea   COLONOSCOPY     COLONOSCOPY WITH PROPOFOL N/A 03/10/2017   Procedure: COLONOSCOPY WITH PROPOFOL;  Surgeon: Christena Deem, MD;  Location: [facility] ENDOSCOPY;  Service: Endoscopy;  Laterality: N/A;   ESOPHAGOGASTRODUODENOSCOPY (EGD) WITH PROPOFOL N/A 02/02/2019   Procedure: ESOPHAGOGASTRODUODENOSCOPY (EGD) WITH PROPOFOL;  Surgeon: Toledo, Boykin Nearing, MD;  Location: [facility] ENDOSCOPY;  Service: Endoscopy;  Laterality: N/A;   IVC FILTER INSERTION N/A 06/11/2016   Procedure: IVC Filter Insertion;  Surgeon: Renford Dills, MD;  Location: [facility] INVASIVE CV LAB;  Service: Cardiovascular;  Laterality: N/A;   IVC FILTER REMOVAL N/A 05/06/2017   Procedure: IVC FILTER REMOVAL;  Surgeon: Renford Dills, MD;  Location: [facility] INVASIVE CV LAB;  Service: Cardiovascular;  Laterality: N/A;   JOINT REPLACEMENT Left 06/17/2016   Knees, pt stated left and right have been replaced.   LAPAROSCOPIC APPENDECTOMY N/A 10/10/2018   Procedure: APPENDECTOMY LAPAROSCOPIC ATTEMPTED CONVERTED TO OPEN;  Surgeon: Carolan Shiver, MD;  Location: [facility] ORS;  Service: General;  Laterality: N/A;   LAPAROSCOPY N/A 10/10/2018    Procedure: LAPAROSCOPY DIAGNOSTIC ATTEMPTED CONVERTED TO OPEN;  Surgeon: Carolan Shiver, MD;  Location: [facility] ORS;  Service: General;  Laterality: N/A;   LUMBAR LAMINECTOMY/DECOMPRESSION MICRODISCECTOMY N/A 07/09/2021   Procedure: Laminectomy and Foraminotomy - Lumbar Two-Lumbar Three - Lumbar Three-Lumbar Four - Lumbar Four-Lumbar Five;  Surgeon: Julio Sicks, MD;  Location: [facility];  Service: Neurosurgery;  Laterality: N/A;   neck fusion     PARTIAL COLECTOMY Right 10/10/2018   Procedure: PARTIAL COLECTOMY;  Surgeon: Carolan Shiver, MD;  Location: [facility] ORS;  Service: General;  Laterality: Right;   PERIPHERAL VASCULAR CATHETERIZATION N/A 08/15/2015   Procedure: pulmonary angiogram with lysis;  Surgeon: Renford Dills, MD;  Location: [facility] INVASIVE CV LAB;  Service: Cardiovascular;  Laterality: N/A;   WRIST SURGERY Left 2000   Past Surgical History:  Procedure Laterality Date   APPENDECTOMY     BREAST BIOPSY Left 08/19/2004  lt bx/clip-neg   CATARACT EXTRACTION W/PHACO Left 09/10/2022   Procedure: CATARACT EXTRACTION PHACO AND INTRAOCULAR LENS PLACEMENT (IOC) LEFT  5.88  00:31.9;  Surgeon: Galen Manila, MD;  Location: [facility];  Service: Ophthalmology;  Laterality: Left;  sleep apnea   COLONOSCOPY     COLONOSCOPY WITH PROPOFOL N/A 03/10/2017   Procedure: COLONOSCOPY WITH PROPOFOL;  Surgeon: Christena Deem, MD;  Location[facility]COPY;  Service: Endoscopy;  Laterality: N/A;   ESOPHAGOGASTRODUODENOSCOPY (EGD) WITH PROPOFOL N/A 02/02/2019   Procedure: ESOPHAGOGASTRODUODENOSCOPY (EGD) WITH PROPOFOL;  Surgeon: Toledo, Boykin Nearing, MD;  Location: [facility] ENDOSCOPY;  Service: Endoscopy;  Laterality: N/A;   IVC FILTER INSERTION N/A 06/11/2016   Procedure: IVC Filter Insertion;  Surgeon: Renford Dills, MD;  Location: [facility] CV LAB;  Service: Cardiovascular;  Laterality: N/A;   IVC FILTER REMOVAL N/A 05/06/2017   Procedure: IVC FILTER REMOVAL;  Surgeon: Renford Dills, MD;  Location: [facility];  Service: Cardiovascular;  Laterality: N/A;   JOINT REPLACEMENT Left 06/17/2016   Knees, pt stated left and right have been replaced.   LAPAROSCOPIC APPENDECTOMY N/A 10/10/2018   Procedure: APPENDECTOMY LAPAROSCOPIC ATTEMPTED CONVERTED TO OPEN;  Surgeon: Carolan Shiver, MD;  Location: [facility];  Service: General;  Laterality: N/A;   LAPAROSCOPY N/A 10/10/2018   Procedure: LAPAROSCOPY DIAGNOSTIC ATTEMPTED CONVERTED TO OPEN;  Surgeon: Carolan Shiver, MD;  Location: [facility];  Service: General;  Laterality: N/A;   LUMBAR LAMINECTOMY/DECOMPRESSION MICRODISCECTOMY N/A 07/09/2021   Procedure: Laminectomy and Foraminotomy - Lumbar Two-Lumbar Three - Lumbar Three-Lumbar Four - Lumbar Four-Lumbar Five;  Surgeon: Julio Sicks, MD;  Location: [facility];  Service: Neurosurgery;  Laterality: N/A;   neck fusion     PARTIAL COLECTOMY Right 10/10/2018   Procedure: PARTIAL COLECTOMY;  Surgeon: Carolan Shiver, MD;  Location: [facility]S;  Service: General;  Laterality: Right;   PERIPHERAL VASCULAR CATHETERIZATION N/A 08/15/2015   Procedure: pulmonary angiogram with lysis;  Surgeon: Renford Dills, MD;  Location: [facility] INVASIVE CV LAB;  Service: Cardiovascular;  Laterality: N/A;   WRIST SURGERY Left 2000   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)    Macular degeneration    MGUS (monoclonal gammopathy of unknown significance)    Morbid obesity (HCC)    Obesity    Pulmonary embolism (HCC)    PVD (peripheral vascular disease) (HCC)     Sleep apnea    CPAP   BP (!) 151/84   Pulse (!) 56   Ht 5\' 4"  (1.626 m)   Wt 241 lb (109.3 kg)   SpO2 95%   BMI 41.37 kg/m   Opioid Risk Score:   Fall Risk Score:  `1  Depression screen PHQ 2/9     09/23/2022    1:07 PM 06/24/2022    1:46 PM 03/11/2022    1:37 PM 11/16/2021   11:18 AM 11/05/2021   10:42 AM 06/26/2021    2:11 PM 05/22/2021  8:41 AM  Depression screen PHQ 2/9  Decreased Interest 0 1 0 2 0 0 0  Down, Depressed, Hopeless 0 1 0 0 0 0 0  PHQ - 2 Score 0 2 0 2 0 0 0  Altered sleeping    1     Tired, decreased energy    1     Change in appetite    0     Feeling bad or failure about yourself     0     Trouble concentrating    0     Moving slowly or fidgety/restless    0     Suicidal thoughts    0     PHQ-9 Score    4     Difficult doing work/chores    Very difficult         Review of Systems  Musculoskeletal:  Positive for back pain.       Pain down hip to mid hamstring  All other systems reviewed and are negative.     Objective:   Physical Exam  Awake, alert, appropriate, has Rolator, NAD Wearing splint on R wrist up to distal elbow Cannot flex R thumb- at [facility] joint, but not IP joint.   TTP across R low back- L4-S1 paraspinals.  But not on Left side.   2-3+ LE edema B/L -      Assessment & Plan:   Pt is a 82 yr old female with BMI of 41, CLL; hx of Bell's palsy; central lumbar stenosis and radiculopathy- hx of Lumbar lami L2-L5; HTN- but not lately. 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  Here for f/u on chronic pain.    Will try Increasing Norco to 10/325 mg up to 4x/day as needed-   2. Has 1 year on Duloxetine and has 3 months on Gabapentin- so will wait on refills.    3. Goal is to bring things/pain down a few notches-  Not to make it go away completely.    4. Increasing Norco/Hydrocodone, so make sure you don't drive right after takes it for the first itme and monitor  Symptoms closely. If it makes you too sleepy- then don't take more or break it in half to take it.    5. Con't Methocarbamol as needed for muscle tightness.    6. F/U in 3 months- but call me in 1 month to let me know how things going.   7. Call PCP about LE swelling- too much

## 2022-12-30 NOTE — Patient Instructions (Signed)
Pt is a 82 yr old female with BMI of 41, CLL; hx of Bell's palsy; central lumbar stenosis and radiculopathy- hx of Lumbar lami L2-L5; HTN- but not lately. 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  Here for f/u on chronic pain.    Will try Increasing Norco to 10/325 mg up to 4x/day as needed-   2. Has 1 year on Duloxetine and has 3 months on Gabapentin- so will wait on refills.    3. Goal is to bring things/pain down a few notches-  Not to make it go away completely.    4. Increasing Norco/Hydrocodone, so make sure you don't drive right after takes it for the first itme and monitor Symptoms closely. If it makes you too sleepy- then don't take more or break it in half to take it.    5. Con't Methocarbamol as needed for muscle tightness.    6. F/U in 3 months- but call me in 1 month to let me know how things going.

## 2023-01-02 DIAGNOSIS — M25641 Stiffness of right hand, not elsewhere classified: Secondary | ICD-10-CM | POA: Diagnosis not present

## 2023-01-07 DIAGNOSIS — T8484XD Pain due to internal orthopedic prosthetic devices, implants and grafts, subsequent encounter: Secondary | ICD-10-CM | POA: Diagnosis not present

## 2023-01-08 ENCOUNTER — Other Ambulatory Visit: Payer: Self-pay | Admitting: Internal Medicine

## 2023-01-08 DIAGNOSIS — Z1231 Encounter for screening mammogram for malignant neoplasm of breast: Secondary | ICD-10-CM

## 2023-01-13 DIAGNOSIS — I129 Hypertensive chronic kidney disease with stage 1 through stage 4 chronic kidney disease, or unspecified chronic kidney disease: Secondary | ICD-10-CM | POA: Diagnosis not present

## 2023-01-13 DIAGNOSIS — N1831 Chronic kidney disease, stage 3a: Secondary | ICD-10-CM | POA: Diagnosis not present

## 2023-01-16 DIAGNOSIS — T8484XD Pain due to internal orthopedic prosthetic devices, implants and grafts, subsequent encounter: Secondary | ICD-10-CM | POA: Diagnosis not present

## 2023-01-16 DIAGNOSIS — M25641 Stiffness of right hand, not elsewhere classified: Secondary | ICD-10-CM | POA: Diagnosis not present

## 2023-01-21 DIAGNOSIS — M25641 Stiffness of right hand, not elsewhere classified: Secondary | ICD-10-CM | POA: Diagnosis not present

## 2023-01-21 DIAGNOSIS — T8484XD Pain due to internal orthopedic prosthetic devices, implants and grafts, subsequent encounter: Secondary | ICD-10-CM | POA: Diagnosis not present

## 2023-01-24 DIAGNOSIS — S52501D Unspecified fracture of the lower end of right radius, subsequent encounter for closed fracture with routine healing: Secondary | ICD-10-CM | POA: Diagnosis not present

## 2023-01-24 DIAGNOSIS — M65311 Trigger thumb, right thumb: Secondary | ICD-10-CM | POA: Diagnosis not present

## 2023-01-24 DIAGNOSIS — S52301D Unspecified fracture of shaft of right radius, subsequent encounter for closed fracture with routine healing: Secondary | ICD-10-CM | POA: Diagnosis not present

## 2023-01-24 DIAGNOSIS — S52571D Other intraarticular fracture of lower end of right radius, subsequent encounter for closed fracture with routine healing: Secondary | ICD-10-CM | POA: Diagnosis not present

## 2023-01-27 DIAGNOSIS — K219 Gastro-esophageal reflux disease without esophagitis: Secondary | ICD-10-CM | POA: Diagnosis not present

## 2023-01-27 DIAGNOSIS — K529 Noninfective gastroenteritis and colitis, unspecified: Secondary | ICD-10-CM | POA: Diagnosis not present

## 2023-01-27 DIAGNOSIS — K224 Dyskinesia of esophagus: Secondary | ICD-10-CM | POA: Diagnosis not present

## 2023-01-29 DIAGNOSIS — T8484XD Pain due to internal orthopedic prosthetic devices, implants and grafts, subsequent encounter: Secondary | ICD-10-CM | POA: Diagnosis not present

## 2023-01-29 DIAGNOSIS — M25641 Stiffness of right hand, not elsewhere classified: Secondary | ICD-10-CM | POA: Diagnosis not present

## 2023-02-06 DIAGNOSIS — M25641 Stiffness of right hand, not elsewhere classified: Secondary | ICD-10-CM | POA: Diagnosis not present

## 2023-02-06 DIAGNOSIS — T8484XD Pain due to internal orthopedic prosthetic devices, implants and grafts, subsequent encounter: Secondary | ICD-10-CM | POA: Diagnosis not present

## 2023-02-10 ENCOUNTER — Ambulatory Visit
Admission: RE | Admit: 2023-02-10 | Discharge: 2023-02-10 | Disposition: A | Discharge status: Home / Self Care | Payer: Medicare PPO | Source: Ambulatory Visit | Attending: Internal Medicine | Admitting: Internal Medicine

## 2023-02-10 DIAGNOSIS — Z1231 Encounter for screening mammogram for malignant neoplasm of breast: Secondary | ICD-10-CM | POA: Diagnosis not present

## 2023-02-12 DIAGNOSIS — M19011 Primary osteoarthritis, right shoulder: Secondary | ICD-10-CM | POA: Diagnosis not present

## 2023-02-12 DIAGNOSIS — M25641 Stiffness of right hand, not elsewhere classified: Secondary | ICD-10-CM | POA: Diagnosis not present

## 2023-02-12 DIAGNOSIS — T8484XD Pain due to internal orthopedic prosthetic devices, implants and grafts, subsequent encounter: Secondary | ICD-10-CM | POA: Diagnosis not present

## 2023-02-17 ENCOUNTER — Other Ambulatory Visit: Payer: Self-pay | Admitting: Internal Medicine

## 2023-02-17 DIAGNOSIS — H353221 Exudative age-related macular degeneration, left eye, with active choroidal neovascularization: Secondary | ICD-10-CM | POA: Diagnosis not present

## 2023-02-17 DIAGNOSIS — N6489 Other specified disorders of breast: Secondary | ICD-10-CM

## 2023-02-17 DIAGNOSIS — R928 Other abnormal and inconclusive findings on diagnostic imaging of breast: Secondary | ICD-10-CM

## 2023-02-19 DIAGNOSIS — M25641 Stiffness of right hand, not elsewhere classified: Secondary | ICD-10-CM | POA: Diagnosis not present

## 2023-02-19 DIAGNOSIS — T8484XA Pain due to internal orthopedic prosthetic devices, implants and grafts, initial encounter: Secondary | ICD-10-CM | POA: Diagnosis not present

## 2023-02-21 DIAGNOSIS — T8484XA Pain due to internal orthopedic prosthetic devices, implants and grafts, initial encounter: Secondary | ICD-10-CM | POA: Diagnosis not present

## 2023-02-21 DIAGNOSIS — S52301D Unspecified fracture of shaft of right radius, subsequent encounter for closed fracture with routine healing: Secondary | ICD-10-CM | POA: Diagnosis not present

## 2023-02-21 DIAGNOSIS — S52611D Displaced fracture of right ulna styloid process, subsequent encounter for closed fracture with routine healing: Secondary | ICD-10-CM | POA: Diagnosis not present

## 2023-02-21 DIAGNOSIS — S52571D Other intraarticular fracture of lower end of right radius, subsequent encounter for closed fracture with routine healing: Secondary | ICD-10-CM | POA: Diagnosis not present

## 2023-02-25 ENCOUNTER — Ambulatory Visit
Admission: RE | Admit: 2023-02-25 | Discharge: 2023-02-25 | Disposition: A | Discharge status: Home / Self Care | Payer: Medicare PPO | Source: Ambulatory Visit | Attending: Internal Medicine | Admitting: Internal Medicine

## 2023-02-25 ENCOUNTER — Inpatient Hospital Stay
Admission: RE | Admit: 2023-02-25 | Discharge: 2023-02-25 | Discharge status: Home / Self Care | Payer: Medicare PPO | Source: Ambulatory Visit | Attending: Internal Medicine | Admitting: Internal Medicine

## 2023-02-25 DIAGNOSIS — R590 Localized enlarged lymph nodes: Secondary | ICD-10-CM | POA: Diagnosis not present

## 2023-02-25 DIAGNOSIS — R92322 Mammographic fibroglandular density, left breast: Secondary | ICD-10-CM | POA: Diagnosis not present

## 2023-02-25 DIAGNOSIS — R928 Other abnormal and inconclusive findings on diagnostic imaging of breast: Secondary | ICD-10-CM | POA: Diagnosis not present

## 2023-02-25 DIAGNOSIS — N6489 Other specified disorders of breast: Secondary | ICD-10-CM | POA: Diagnosis not present

## 2023-02-25 DIAGNOSIS — N6322 Unspecified lump in the left breast, upper inner quadrant: Secondary | ICD-10-CM | POA: Diagnosis not present

## 2023-02-26 DIAGNOSIS — T8484XD Pain due to internal orthopedic prosthetic devices, implants and grafts, subsequent encounter: Secondary | ICD-10-CM | POA: Diagnosis not present

## 2023-02-26 DIAGNOSIS — M25641 Stiffness of right hand, not elsewhere classified: Secondary | ICD-10-CM | POA: Diagnosis not present

## 2023-02-27 ENCOUNTER — Other Ambulatory Visit: Payer: Self-pay | Admitting: Internal Medicine

## 2023-02-27 DIAGNOSIS — R928 Other abnormal and inconclusive findings on diagnostic imaging of breast: Secondary | ICD-10-CM

## 2023-02-27 DIAGNOSIS — N63 Unspecified lump in unspecified breast: Secondary | ICD-10-CM

## 2023-02-27 DIAGNOSIS — R599 Enlarged lymph nodes, unspecified: Secondary | ICD-10-CM

## 2023-03-05 ENCOUNTER — Ambulatory Visit
Admission: RE | Admit: 2023-03-05 | Discharge: 2023-03-05 | Disposition: A | Discharge status: Home / Self Care | Payer: Medicare PPO | Source: Ambulatory Visit | Attending: Internal Medicine | Admitting: Internal Medicine

## 2023-03-05 DIAGNOSIS — M25641 Stiffness of right hand, not elsewhere classified: Secondary | ICD-10-CM | POA: Diagnosis not present

## 2023-03-05 DIAGNOSIS — C9112 Chronic lymphocytic leukemia of B-cell type in relapse: Secondary | ICD-10-CM | POA: Insufficient documentation

## 2023-03-05 DIAGNOSIS — R590 Localized enlarged lymph nodes: Secondary | ICD-10-CM | POA: Diagnosis not present

## 2023-03-05 DIAGNOSIS — C8304 Small cell B-cell lymphoma, lymph nodes of axilla and upper limb: Secondary | ICD-10-CM | POA: Diagnosis not present

## 2023-03-05 DIAGNOSIS — R599 Enlarged lymph nodes, unspecified: Secondary | ICD-10-CM

## 2023-03-05 DIAGNOSIS — R928 Other abnormal and inconclusive findings on diagnostic imaging of breast: Secondary | ICD-10-CM | POA: Diagnosis present

## 2023-03-05 DIAGNOSIS — T8484XD Pain due to internal orthopedic prosthetic devices, implants and grafts, subsequent encounter: Secondary | ICD-10-CM | POA: Diagnosis not present

## 2023-03-05 DIAGNOSIS — C9111 Chronic lymphocytic leukemia of B-cell type in remission: Secondary | ICD-10-CM | POA: Diagnosis present

## 2023-03-05 DIAGNOSIS — C8309 Small cell B-cell lymphoma, extranodal and solid organ sites: Secondary | ICD-10-CM | POA: Diagnosis not present

## 2023-03-05 DIAGNOSIS — N63 Unspecified lump in unspecified breast: Secondary | ICD-10-CM | POA: Insufficient documentation

## 2023-03-05 DIAGNOSIS — N6322 Unspecified lump in the left breast, upper inner quadrant: Secondary | ICD-10-CM | POA: Diagnosis not present

## 2023-03-05 HISTORY — PX: BREAST BIOPSY: SHX20

## 2023-03-05 MED ORDER — LIDOCAINE 1 % OPTIME INJ - NO CHARGE
2.0000 mL | Freq: Once | INTRAMUSCULAR | Status: AC
  Administered 2023-03-05: 2 mL
  Filled 2023-03-05: qty 2

## 2023-03-05 MED ORDER — LIDOCAINE-EPINEPHRINE 1 %-1:100000 IJ SOLN
8.0000 mL | Freq: Once | INTRAMUSCULAR | Status: AC
  Administered 2023-03-05: 8 mL
  Filled 2023-03-05: qty 8

## 2023-03-06 DIAGNOSIS — I2692 Saddle embolus of pulmonary artery without acute cor pulmonale: Secondary | ICD-10-CM | POA: Diagnosis not present

## 2023-03-06 DIAGNOSIS — I129 Hypertensive chronic kidney disease with stage 1 through stage 4 chronic kidney disease, or unspecified chronic kidney disease: Secondary | ICD-10-CM | POA: Diagnosis not present

## 2023-03-06 DIAGNOSIS — R7303 Prediabetes: Secondary | ICD-10-CM | POA: Diagnosis not present

## 2023-03-06 DIAGNOSIS — G4733 Obstructive sleep apnea (adult) (pediatric): Secondary | ICD-10-CM | POA: Diagnosis not present

## 2023-03-06 DIAGNOSIS — N1831 Chronic kidney disease, stage 3a: Secondary | ICD-10-CM | POA: Diagnosis not present

## 2023-03-06 DIAGNOSIS — Z6839 Body mass index (BMI) 39.0-39.9, adult: Secondary | ICD-10-CM | POA: Diagnosis not present

## 2023-03-06 DIAGNOSIS — I2782 Chronic pulmonary embolism: Secondary | ICD-10-CM | POA: Diagnosis not present

## 2023-03-06 DIAGNOSIS — F325 Major depressive disorder, single episode, in full remission: Secondary | ICD-10-CM | POA: Diagnosis not present

## 2023-03-07 LAB — SURGICAL PATHOLOGY

## 2023-03-10 ENCOUNTER — Telehealth: Payer: Self-pay | Admitting: *Deleted

## 2023-03-10 ENCOUNTER — Telehealth: Payer: Self-pay | Admitting: Internal Medicine

## 2023-03-10 NOTE — Telephone Encounter (Signed)
Talked to pt re: results of the Biopsy- s/o SLL.   Reocmmend follow up in 1-2 weeks- MD: no labs- keep appts as planned in July 2025.   GB

## 2023-03-10 NOTE — Telephone Encounter (Signed)
[name] with [facility] called biopsy report, patient is upset and is asking for a call to discuss these recent results. Done 11/13    SURGICAL PATHOLOGY SURGICAL PATHOLOGY [facility] [address] [name], [place] [address] Telephone [phone] or [phone] Fax [phone]  REPORT OF SURGICAL PATHOLOGY   Accession #: [id number] Patient Name: Jamie Phillips, Jamie Phillips Visit # : [id number]  MRN: [id number] Physician: Jacob Moores DOB/Age 07-21-1940 (Age: 10) Gender: F Collected Date: 03/05/2023 Received Date: 03/05/2023  FINAL DIAGNOSIS       1. Breast, left, needle core biopsy, 11 o'clock, 10cmfn (heart) :      -SMALL LYMPHOCYTIC LYMPHOMA      -SEE COMMENT       2. Lymph node, needle/core biopsy, left axilla (hydromark ribbon) :      -SMALL LYMPHOCYTIC LYMPHOMA      -SEE COMMENT

## 2023-03-21 IMAGING — MR MR HIP*R* W/O CM
4 of 5 series · 26 of 40 positions shown · non-contrast
Comparison: None.

CLINICAL DATA: Chronic right hip pain.

EXAM:
MR OF THE RIGHT HIP WITHOUT CONTRAST
TECHNIQUE: Multiplanar, multisequence MR imaging was performed. No intravenous
contrast was administered.

[Series 5: T1 · coronal · 4.0mm · 0.85mm/px · 8 of 38 slices shown]
[im 1/38]
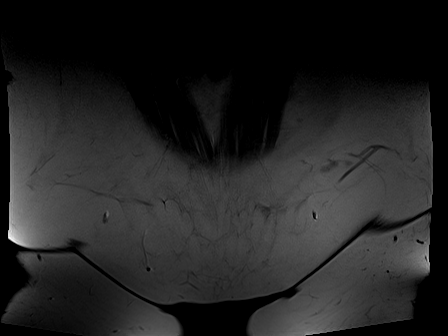
[im 5/38]
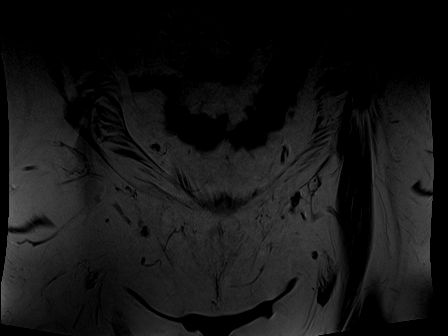
[im 13/38]
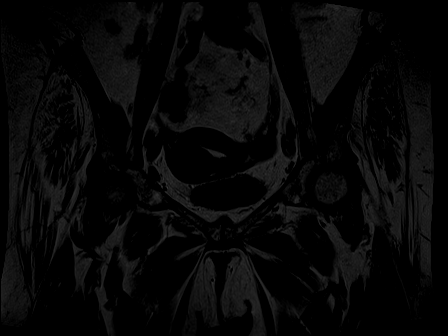
[im 17/38]
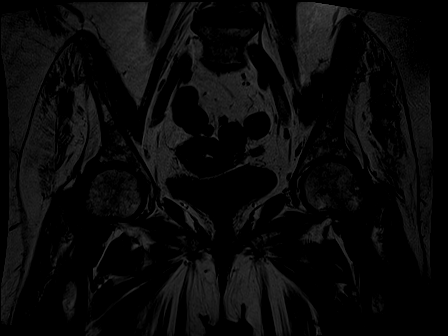
[im 21/38]
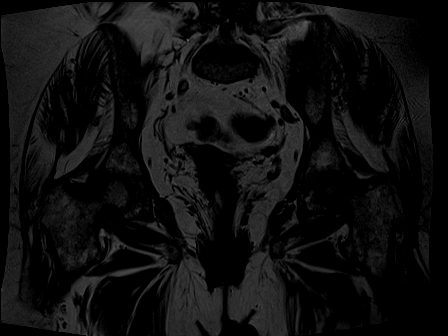
[im 25/38]
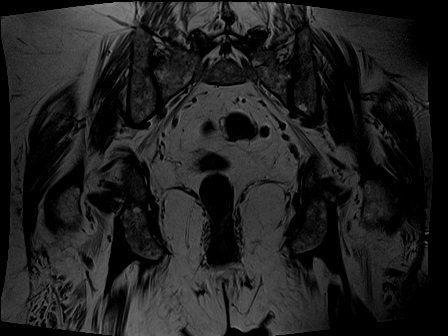
[im 33/38]
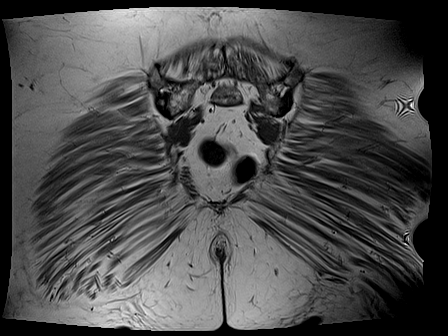
[im 38/38]
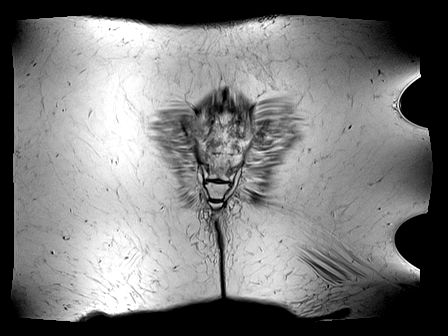

[Series 6: STIR · coronal · 4.0mm · 1.19mm/px · 5 of 35 slices shown]
[im 1/35]
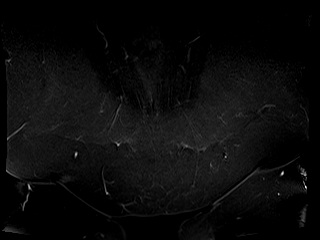
[im 5/35]
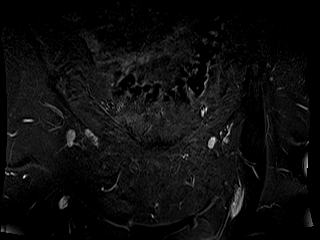
[im 9/35]
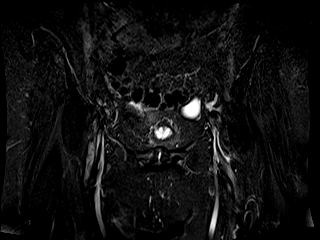
[im 18/35]
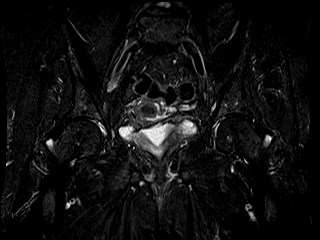
[im 30/35]
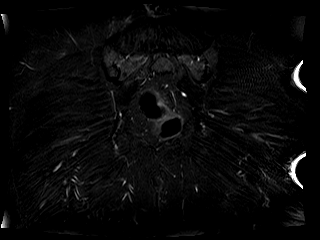

[Series 8: PD fat-sat · sagittal · 4.5mm · 0.35mm/px · 7 of 27 slices shown (1 of 2)]
[im 1/27]
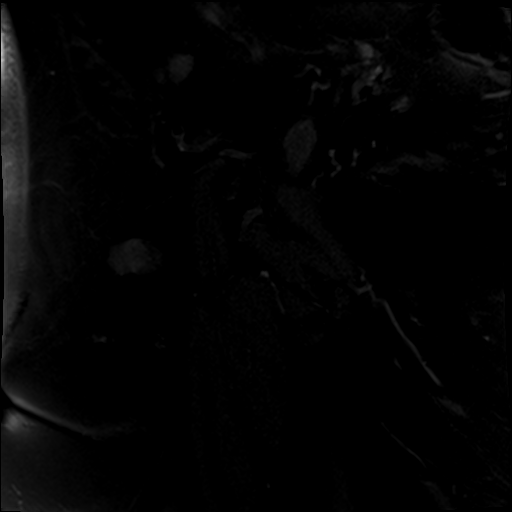
[im 5/27]
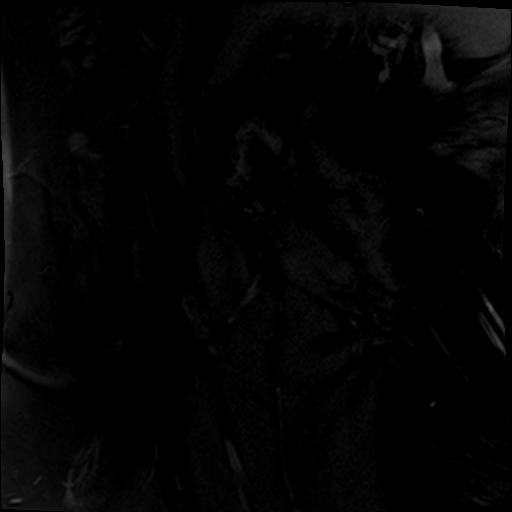
[im 9/27]
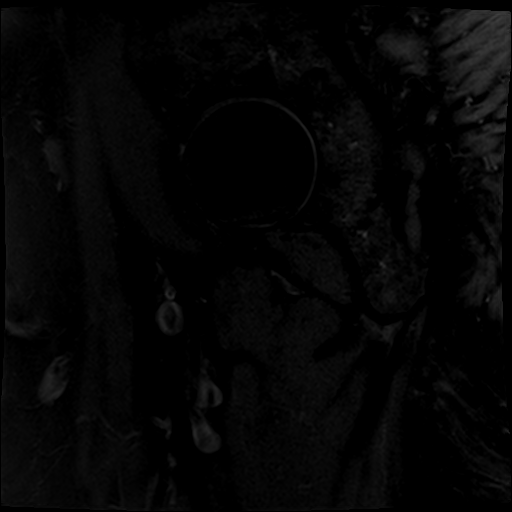
[im 14/27]
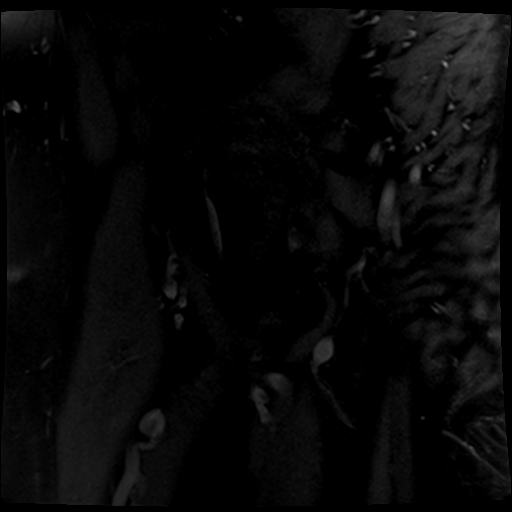
[im 18/27]
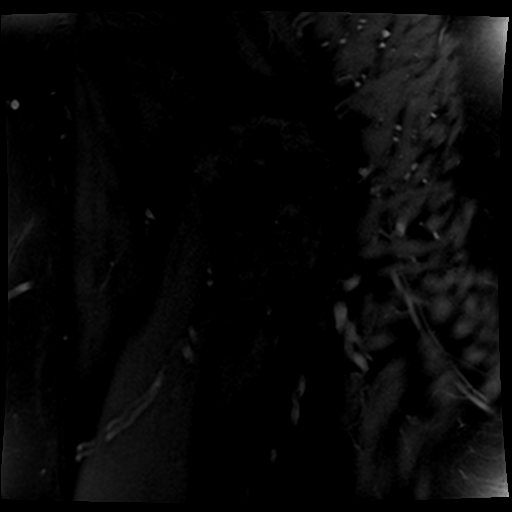
[im 22/27]
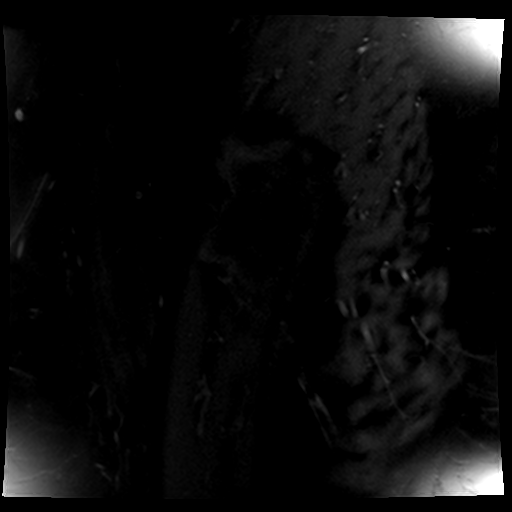
[im 27/27]
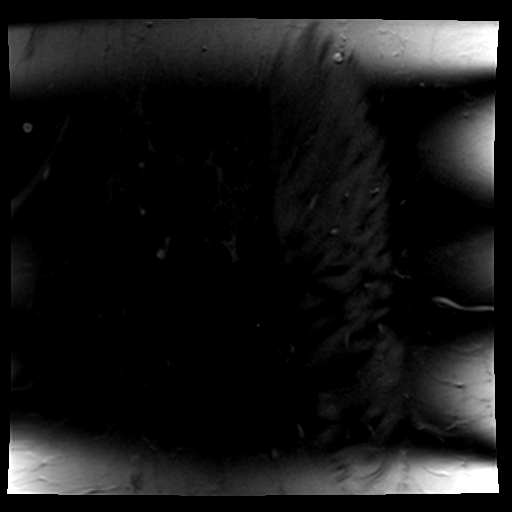

[Series 9: PD fat-sat · coronal · 4.5mm · 0.35mm/px · 6 of 23 slices shown (2 of 2)]
[im 1/23]
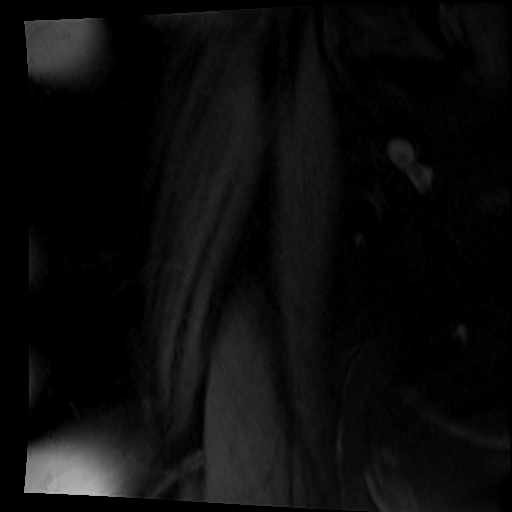
[im 5/23]
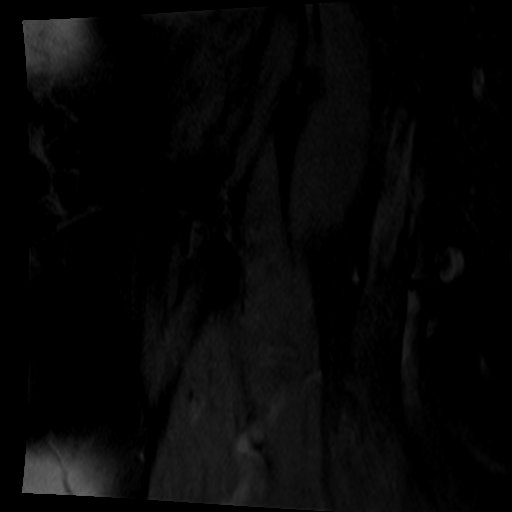
[im 9/23]
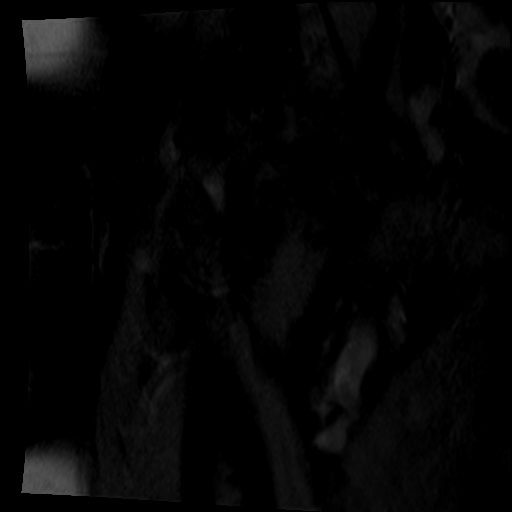
[im 14/23]
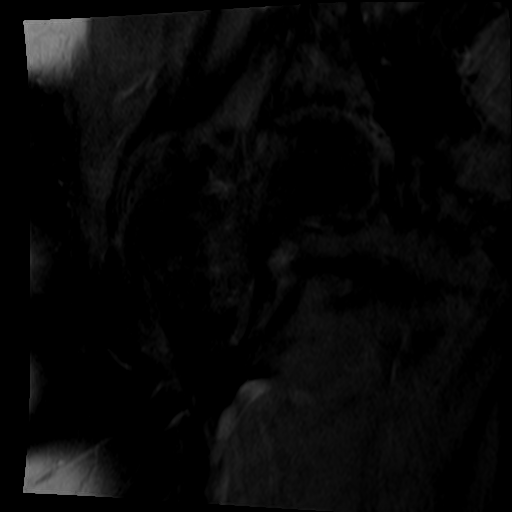
[im 18/23]
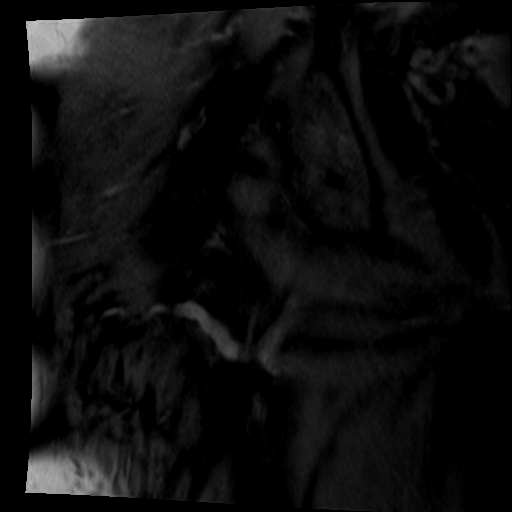
[im 23/23]
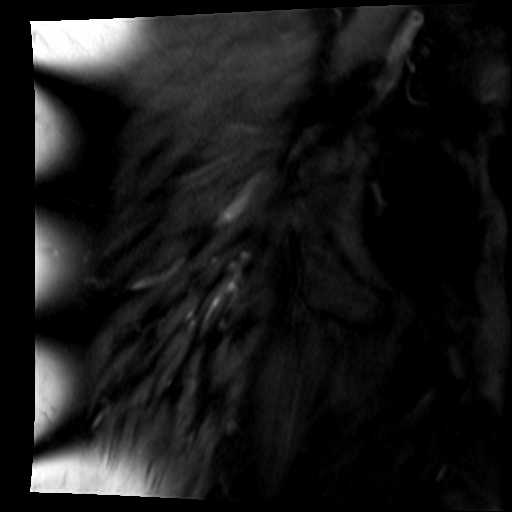

[26 of 40 positions shown; findings below may reference images not displayed]

FINDINGS: Bones:

No hip fracture, dislocation or avascular necrosis.

No periosteal reaction or bone destruction. No aggressive osseous
lesion.

Normal sacrum and sacroiliac joints. No SI joint widening or erosive
changes.

Mild degenerative changes of the pubic symphysis with a small
effusion.

Articular cartilage and labrum

Articular cartilage: Mild partial-thickness cartilage loss of the
right hip.

Labrum:  Diffuse labral degeneration with ossification.

Joint or bursal effusion

Joint effusion:  No hip joint effusion.  No SI joint effusion.

Bursae:  No bursa formation.

Muscles and tendons

Flexors: Normal.

Extensors: Normal.

Abductors: Normal.

Adductors: Normal.

Gluteals: Atrophy of the right gluteus minimus and medius muscles.
Chronic tear of the right gluteus minimus tendon insertion.

Hamstrings: Small interstitial tear at the hamstring origin
bilaterally.

Other findings

No pelvic free fluid. No fluid collection or hematoma. No inguinal
lymphadenopathy. No inguinal hernia. 3.3 cm left ovarian cyst. No
follow up imaging recommended. Note: This recommendation does not
apply to premenarchal patients and to those with increased risk
(genetic, family history, elevated tumor markers or other high-risk
factors) of ovarian cancer. Reference: JACR [DATE]):[id number]

3 cm right uterine fibroid. Diverticulosis without evidence of
diverticulitis.
IMPRESSION: 1. No hip fracture, dislocation or avascular necrosis.
2. Mild osteoarthritis of the right hip.

## 2023-03-31 ENCOUNTER — Encounter: Payer: Self-pay | Admitting: Physical Medicine and Rehabilitation

## 2023-03-31 ENCOUNTER — Encounter: Payer: Medicare PPO | Attending: Physical Medicine and Rehabilitation | Admitting: Physical Medicine and Rehabilitation

## 2023-03-31 VITALS — BP 122/84 | HR 63 | Ht 64.0 in | Wt 241.0 lb

## 2023-03-31 DIAGNOSIS — Z79891 Long term (current) use of opiate analgesic: Secondary | ICD-10-CM | POA: Diagnosis not present

## 2023-03-31 DIAGNOSIS — C801 Malignant (primary) neoplasm, unspecified: Secondary | ICD-10-CM | POA: Diagnosis not present

## 2023-03-31 DIAGNOSIS — M5441 Lumbago with sciatica, right side: Secondary | ICD-10-CM

## 2023-03-31 DIAGNOSIS — G8929 Other chronic pain: Secondary | ICD-10-CM | POA: Diagnosis not present

## 2023-03-31 DIAGNOSIS — G894 Chronic pain syndrome: Secondary | ICD-10-CM | POA: Diagnosis not present

## 2023-03-31 DIAGNOSIS — G63 Polyneuropathy in diseases classified elsewhere: Secondary | ICD-10-CM | POA: Diagnosis not present

## 2023-03-31 DIAGNOSIS — Z5181 Encounter for therapeutic drug level monitoring: Secondary | ICD-10-CM

## 2023-03-31 DIAGNOSIS — M5442 Lumbago with sciatica, left side: Secondary | ICD-10-CM

## 2023-03-31 MED ORDER — HYDROCODONE-ACETAMINOPHEN 10-325 MG PO TABS: 1.0000 | ORAL_TABLET | Freq: Four times a day (QID) | ORAL | 0 refills | Status: DC | PRN

## 2023-03-31 MED ORDER — DULOXETINE HCL 60 MG PO CPEP: 60.0000 mg | ORAL_CAPSULE | Freq: Every day | ORAL | 3 refills | Status: AC

## 2023-03-31 MED ORDER — GABAPENTIN 400 MG PO CAPS: 400.0000 mg | ORAL_CAPSULE | Freq: Four times a day (QID) | ORAL | 1 refills | Status: DC

## 2023-03-31 NOTE — Progress Notes (Signed)
Subjective:    Patient ID: Jamie Phillips, female    DOB: 07-20-1940, 82 y.o.   MRN: [id number]  HPI Pt is a 82 yr old female with BMI of 41, CLL; hx of Bell's palsy; central lumbar stenosis and radiculopathy- hx of Lumbar lami L2-L5; HTN- but not lately. 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   Has RW- had to borrow a car to get here but has RW car didn't work this AM. .     Underwent 10/4 R trigger fingers of thumb injections;  R CTS relewase 09/30/22 and removal of dorsal bridge plate 11/14/34- from R distal radius fx earlier in year.   Still doesn't have a lot of feeling in R hand.  Everything picks u with R hand, drops it- cannot maintain grip-   Also had injection for CTS? Vs trigger thumb.   Tired of taking 13 pills/in AM.   Still spot in back - still hurting- 1 circle that still hurts.   Scared will get addicted- takes when hurts so bad, cannot stand it- takes with tylenol arthritis- gets the edge off-  At least 2-3x/week- but not regularly- not more than 1x/day.    "Acts like a drunk"- because falls to the side- due to neuropathy. - last fall 2-3 weeks ago-  Has to stand to get balance before moves or will fall.  That's the reason why fx'd RUE/radius.    Neuropathy- feet burn so bad- takes gabapentin- rubs fee tin horse lineament.  Not diabetic- doesn't know why has neuropathy.    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  Had CLL  quite a few years.  Numbers are OK.  Had to get mammogram- got biopsy-   Decreased Losartan/hydrochlorothiazide due to low BP.   Takes a lot of vitamins- has to take a LOT of vitamins- tumeric, Vit B12; magnesium; calcium and MVI. So that's annoying as well.   Pain Inventory Average Pain 5 Pain Right Now 6 My pain is constant and sharp  In the  last 24 hours, has pain interfered with the following? General activity 4 Relation with others 0 Enjoyment of life 4 What TIME of day is your pain at its worst? morning , daytime, evening, and night Sleep (in general) Good  Pain is worse with: walking, standing, and some activites Pain improves with: heat/ice and pacing activities Relief from Meds: 4  Family History  Problem Relation Age of Onset   Pulmonary embolism Mother    Sudden death Mother    Obesity Mother    Heart failure Father    Congestive Heart Failure Father    Sudden death Father    Thyroid cancer Brother    Breast cancer Neg Hx    Social History   Socioeconomic History   Marital status: Widowed    Spouse name: Not on file   Number of children: Not on file   Years of education: Not on file   Highest education level: Not on file  Occupational History   Occupation: Retired  Tobacco Use   Smoking status: Never   Smokeless tobacco: Never  Vaping Use   Vaping status: Never Used  Substance and Sexual Activity   Alcohol use: No    Alcohol/week: 0.0 standard drinks of alcohol  Drug use: No   Sexual activity: Not on file  Other Topics Concern   Not on file  Social History Narrative   ** Merged History Encounter **       Social Determinants of Health   Financial Resource Strain: Low Risk  (01/13/2023)   Received from [facility]   Overall Financial Resource Strain (CARDIA)    Difficulty of Paying Living Expenses: Not hard at all  Food Insecurity: No Food Insecurity (01/13/2023)   Received from [facility]   Hunger Vital Sign    Worried About Running Out of Food in the Last Year: Never true    Ran Out of Food in the Last Year: Never true  Transportation Needs: No Transportation Needs (01/13/2023)   Received from [facility] - Transportation    In the past 12 months, has lack of transportation kept you from medical appointments or from  getting medications?: No    Lack of Transportation (Non-Medical): No  Physical Activity: Unknown (03/18/2017)   Received from [facility], [facility]   Exercise Vital Sign    Days of Exercise per Week: Patient declined    Minutes of Exercise per Session: Patient declined  Stress: Unknown (03/18/2017)   Received from [facility], [facility]   [employer] of Occupational [facility] - Occupational Stress Questionnaire    Feeling of Stress : Patient declined  Social Connections: Unknown (03/18/2017)   Received from [facility], [facility]   Social Connection and Isolation Panel [NHANES]    Frequency of Communication with Friends and Family: Patient declined    Frequency of Social Gatherings with Friends and Family: Patient declined    Attends Religious Services: Patient declined    Active Member of Clubs or Organizations: Patient declined    Attends Engineer, structural: Patient declined    Marital Status: Patient declined   Past Surgical History:  Procedure Laterality Date   APPENDECTOMY     BREAST BIOPSY Left 08/19/2004   lt bx/clip-neg   BREAST BIOPSY Left 03/05/2023   [name] BX heart clip, path pending-   BREAST BIOPSY Left 03/05/2023   [name] BX hydromark coil clip axilla node- path pending   BREAST BIOPSY Left 03/05/2023   [name] LT BREAST BX W LOC DEV 1ST LESION IMG BX SPEC US GUIDE 03/05/2023 [facility]   CATARACT EXTRACTION W/PHACO Left 09/10/2022   Procedure: CATARACT EXTRACTION PHACO AND INTRAOCULAR LENS PLACEMENT (IOC) LEFT  5.88  00:31.9;  Surgeon: Galen Manila, MD;  Location: [facility];  Service: Ophthalmology;  Laterality: Left;  sleep apnea   COLONOSCOPY     COLONOSCOPY WITH PROPOFOL N/A 03/10/2017   Procedure: COLONOSCOPY WITH PROPOFOL;  Surgeon: Christena Deem, MD;  Location: [facility] ENDOSCOPY;  Service: Endoscopy;  Laterality: N/A;    ESOPHAGOGASTRODUODENOSCOPY (EGD) WITH PROPOFOL N/A 02/02/2019   Procedure: ESOPHAGOGASTRODUODENOSCOPY (EGD) WITH PROPOFOL;  Surgeon: Toledo, Boykin Nearing, MD;  Location: [facility] ENDOSCOPY;  Service: Endoscopy;  Laterality: N/A;   IVC FILTER INSERTION N/A 06/11/2016   Procedure: IVC Filter Insertion;  Surgeon: Renford Dills, MD;  Location: [facility] INVASIVE CV LAB;  Service: Cardiovascular;  Laterality: N/A;   IVC FILTER REMOVAL N/A 05/06/2017   Procedure: IVC FILTER REMOVAL;  Surgeon: Renford Dills, MD;  Location: [facility] INVASIVE CV LAB;  Service: Cardiovascular;  Laterality: N/A;   JOINT REPLACEMENT Left 06/17/2016   Knees, pt stated left  and right have been replaced.   LAPAROSCOPIC APPENDECTOMY N/A 10/10/2018   Procedure: APPENDECTOMY LAPAROSCOPIC ATTEMPTED CONVERTED TO OPEN;  Surgeon: Carolan Shiver, MD;  Location: [facility];  Service: General;  Laterality: N/A;   LAPAROSCOPY N/A 10/10/2018   Procedure: LAPAROSCOPY DIAGNOSTIC ATTEMPTED CONVERTED TO OPEN;  Surgeon: Carolan Shiver, MD;  Location: [facility];  Service: General;  Laterality: N/A;   LUMBAR LAMINECTOMY/DECOMPRESSION MICRODISCECTOMY N/A 07/09/2021   Procedure: Laminectomy and Foraminotomy - Lumbar Two-Lumbar Three - Lumbar Three-Lumbar Four - Lumbar Four-Lumbar Five;  Surgeon: Julio Sicks, MD;  [facility];  Service: Neurosurgery;  Laterality: N/A;   neck fusion     PARTIAL COLECTOMY Right 10/10/2018   Procedure: PARTIAL COLECTOMY;  Surgeon: Carolan Shiver, MD;  Location: [facility];  Service: General;  Laterality: Right;   PERIPHERAL VASCULAR CATHETERIZATION N/A 08/15/2015   Procedure: pulmonary angiogram with lysis;  Surgeon: Renford Dills, MD;  Location: [facility] INVASIVE CV LAB;  Service: Cardiovascular;  Laterality: N/A;   WRIST SURGERY Left 2000   Past Surgical History:  Procedure Laterality Date   APPENDECTOMY     BREAST BIOPSY Left 08/19/2004   lt bx/clip-neg   BREAST BIOPSY Left 03/05/2023   [name] BX  heart clip, path pending-   BREAST BIOPSY Left 03/05/2023   [name] BX hydromark coil clip axilla node- path pending   BREAST BIOPSY Left 03/05/2023   [name] LT BREAST BX W LOC DEV 1ST LESION IMG BX SPEC US GUIDE 03/05/2023 [facility]   CATARACT EXTRACTION W/PHACO Left 09/10/2022   Procedure: CATARACT EXTRACTION PHACO AND INTRAOCULAR LENS PLACEMENT (IOC) LEFT  5.88  00:31.9;  Surgeon: Galen Manila, MD;  Location: [facility];  Service: Ophthalmology;  Laterality: Left;  sleep apnea   COLONOSCOPY     COLONOSCOPY WITH PROPOFOL N/A 03/10/2017   Procedure: COLONOSCOPY WITH PROPOFOL;  Surgeon: Christena Deem, MD;  Location: [facility] ENDOSCOPY;  Service: Endoscopy;  Laterality: N/A;   ESOPHAGOGASTRODUODENOSCOPY (EGD) WITH PROPOFOL N/A 02/02/2019   Procedure: ESOPHAGOGASTRODUODENOSCOPY (EGD) WITH PROPOFOL;  Surgeon: Toledo, Boykin Nearing, MD;  Location: [facility];  Service: Endoscopy;  Laterality: N/A;   IVC FILTER INSERTION N/A 06/11/2016   Procedure: IVC Filter Insertion;  Surgeon: Renford Dills, MD;  Location: [facility];  Service: Cardiovascular;  Laterality: N/A;   IVC FILTER REMOVAL N/A 05/06/2017   Procedure: IVC FILTER REMOVAL;  Surgeon: Renford Dills, MD;  Location: [facility];  Service: Cardiovascular;  Laterality: N/A;   JOINT REPLACEMENT Left 06/17/2016   Knees, pt stated left and right have been replaced.   LAPAROSCOPIC APPENDECTOMY N/A 10/10/2018   Procedure: APPENDECTOMY LAPAROSCOPIC ATTEMPTED CONVERTED TO OPEN;  Surgeon: Carolan Shiver, MD;  Location: [facility];  Service: General;  Laterality: N/A;   LAPAROSCOPY N/A 10/10/2018   Procedure: LAPAROSCOPY DIAGNOSTIC ATTEMPTED CONVERTED TO OPEN;  Surgeon: Carolan Shiver, MD;  Location: [facility];  Service: General;  Laterality: N/A;   LUMBAR LAMINECTOMY/DECOMPRESSION MICRODISCECTOMY N/A 07/09/2021   Procedure: Laminectomy and Foraminotomy - Lumbar Two-Lumbar Three - Lumbar Three-Lumbar  Four - Lumbar Four-Lumbar Five;  Surgeon: Julio Sicks, MD;  Location[facility];  Service: Neurosurgery;  Laterality: N/A;   neck fusion     PARTIAL COLECTOMY Right 10/10/2018   Procedure: PARTIAL COLECTOMY;  Surgeon: Carolan Shiver, MD;  Location: [facility];  Service: General;  Laterality: Right;   PERIPHERAL VASCULAR CATHETERIZATION N/A 08/15/2015   Procedure: pulmonary angiogram with lysis;  Surgeon: Renford Dills, MD;  Location: [facility] INVASIVE CV LAB;  Service:  Cardiovascular;  Laterality: N/A;   WRIST SURGERY Left 2000   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)    Macular degeneration    MGUS (monoclonal gammopathy of unknown significance)    Morbid obesity (HCC)    Obesity    Pulmonary embolism (HCC)    PVD (peripheral vascular disease) (HCC)    Sleep apnea    CPAP   Ht 5\' 4"  (1.626 m)   Wt 241 lb (109.3 kg)   BMI 41.37 kg/m   Opioid Risk Score:   Fall Risk Score:  `1  Depression screen PHQ 2/9     09/23/2022    1:07 PM 06/24/2022    1:46 PM 03/11/2022    1:37 PM 11/16/2021   11:18 AM 11/05/2021   10:42 AM 06/26/2021    2:11 PM 05/22/2021    8:41 AM  Depression screen PHQ 2/9  Decreased Interest 0 1 0 2 0 0 0  Down, Depressed, Hopeless 0 1 0 0 0 0 0  PHQ - 2 Score 0 2 0 2 0 0 0  Altered sleeping    1     Tired, decreased energy    1     Change in appetite    0     Feeling bad or failure about yourself     0     Trouble concentrating    0     Moving slowly or fidgety/restless    0     Suicidal thoughts    0     PHQ-9 Score    4     Difficult doing  work/chores    Very difficult       Review of Systems  Musculoskeletal:  Positive for back pain and gait problem.  All other systems reviewed and are negative.      Objective:   Physical Exam  Awake,alert, appropriate, has to stand and sit frequently due to pain, NAD Palpable trigger point in right low back- on paraspinals on R lumbar paraspinals.         Assessment & Plan:   Pt is a 82 yr old female with BMI of 41, CLL; hx of Bell's palsy; central lumbar stenosis and radiculopathy- hx of Lumbar lami L2-L5; HTN- but not lately. 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     Change your lexapro to night time. Can help her sleep better.  Change 20 mg nightly.   2.   Change propranolol to night time-  40 mg nightly-  3. Make changes to reduce meds in AM AND reduce sedation.    4.  Take vitamins at lunch time to spread things out.    5. When takes pain meds, takes the edge off- don't think will get addicted if takes pain meds 1x/day.  Norco- 10/325 mg up to 4x/day, but usually takes at max 1x/day-   6. Refilling gabapentin 400 mg 4 pills/day- 1 in AM; 1 in afternoon and 2 at night- sent in 6 months supply to [facility].   7.  Refilled Cymbatla 60 mg daily- for nerve pain and mood- 6 months supply.    8. Con't with Lexapro- Jannette Spanner.    9. F/u in 3months- since doesn't take pain meds frequently- will see in 3 months-   10. Oral drug screen today per clinic policy.     I spent a total of 32   minutes on total care today- >50% coordination of care- due to  discussion about neuropathy and CLL- most likely related-  and refills as well as assuring pt don't think she will get addicted to meds.

## 2023-03-31 NOTE — Patient Instructions (Signed)
Pt is a 82 yr old female with BMI of 41, CLL; hx of Bell's palsy; central lumbar stenosis and radiculopathy- hx of Lumbar lami L2-L5; HTN- but not lately. 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     Change your lexapro to night time. Can help her sleep better.  Change 20 mg nightly.   2.   Change propranolol to night time-  40 mg nightly-  3. Make changes to reduce meds in AM AND reduce sedation.    4.  Take vitamins at lunch time to spread things out.    5. When takes pain meds, takes the edge off- don't think will get addicted if takes pain meds 1x/day.  Norco- 10/325 mg up to 4x/day, but usually takes at max 1x/day-   6. Refilling gabapentin 400 mg 4 pills/day- 1 in AM; 1 in afternoon and 2 at night- sent in 6 months supply to [facility].   7.  Refilled Cymbatla 60 mg daily- for nerve pain and mood- 6 months supply.    8. Con't with Lexapro- Jannette Spanner.    9. F/u in 3months- since doesn't take pain meds frequently- will see in 3 months-

## 2023-04-01 ENCOUNTER — Inpatient Hospital Stay: Payer: Medicare PPO | Attending: Internal Medicine | Admitting: Internal Medicine

## 2023-04-01 ENCOUNTER — Encounter: Payer: Self-pay | Admitting: Internal Medicine

## 2023-04-01 VITALS — BP 128/85 | HR 73 | Temp 95.1°F | Ht 64.0 in | Wt 240.6 lb

## 2023-04-01 DIAGNOSIS — M549 Dorsalgia, unspecified: Secondary | ICD-10-CM | POA: Diagnosis not present

## 2023-04-01 DIAGNOSIS — Z7901 Long term (current) use of anticoagulants: Secondary | ICD-10-CM | POA: Insufficient documentation

## 2023-04-01 DIAGNOSIS — Z6841 Body Mass Index (BMI) 40.0 and over, adult: Secondary | ICD-10-CM | POA: Diagnosis not present

## 2023-04-01 DIAGNOSIS — E7800 Pure hypercholesterolemia, unspecified: Secondary | ICD-10-CM | POA: Insufficient documentation

## 2023-04-01 DIAGNOSIS — Z86718 Personal history of other venous thrombosis and embolism: Secondary | ICD-10-CM | POA: Diagnosis not present

## 2023-04-01 DIAGNOSIS — N189 Chronic kidney disease, unspecified: Secondary | ICD-10-CM | POA: Diagnosis not present

## 2023-04-01 DIAGNOSIS — I129 Hypertensive chronic kidney disease with stage 1 through stage 4 chronic kidney disease, or unspecified chronic kidney disease: Secondary | ICD-10-CM | POA: Diagnosis not present

## 2023-04-01 DIAGNOSIS — Z86711 Personal history of pulmonary embolism: Secondary | ICD-10-CM | POA: Insufficient documentation

## 2023-04-01 DIAGNOSIS — M2550 Pain in unspecified joint: Secondary | ICD-10-CM | POA: Insufficient documentation

## 2023-04-01 DIAGNOSIS — K219 Gastro-esophageal reflux disease without esophagitis: Secondary | ICD-10-CM | POA: Diagnosis not present

## 2023-04-01 DIAGNOSIS — Z79899 Other long term (current) drug therapy: Secondary | ICD-10-CM | POA: Insufficient documentation

## 2023-04-01 DIAGNOSIS — I739 Peripheral vascular disease, unspecified: Secondary | ICD-10-CM | POA: Insufficient documentation

## 2023-04-01 DIAGNOSIS — D472 Monoclonal gammopathy: Secondary | ICD-10-CM | POA: Diagnosis not present

## 2023-04-01 DIAGNOSIS — C9110 Chronic lymphocytic leukemia of B-cell type not having achieved remission: Secondary | ICD-10-CM | POA: Insufficient documentation

## 2023-04-01 NOTE — Progress Notes (Signed)
C/o back pain, 10/10, sees pain management.  Larey Seat about a week ago, no injury.  C/o trouble swallowing liquids, sees GI in January 2025.  BP 137/107, she states BP has been low, Dr. Dareen Piano told her take 1/2 of her losartan.

## 2023-04-01 NOTE — Assessment & Plan Note (Addendum)
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  Continue Eliquis- Refill Eliquis with Dr.Anderson.  No concerns for bleeding.stable.    # Chronic low-grade B-cell lymphoproliferative disorder/CLL-hemoglobin platelets normal.  Platelets slightly elevated 14 absolute lymphocyte count 4-5,000.  Asymptomatic/continue surveillance- Mammogram/Left breast/ left AX LN:    -SMALL LYMPHOCYTIC LYMPHOMA.  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 JAN 2024- 0.4gm/dl; K/l=5-  Pending today. Again repeat in 12 months.stable.    # Fatigue-history of obstructive sleep apnea/continue CPAP.stable.    # DISPOSITION: # re-check BP # follow up/keep appt in July as planned- - MD labs-[ cbc/cmp/ldh/MM panel; K-light chains]-Dr.B

## 2023-04-01 NOTE — Progress Notes (Signed)
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 S/p IV Filter explantation.    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  2) CD56 expression on monocytes.   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  --------------------------------------------------------  DIAGNOSIS: CLL  STAGE: I        ;GOALS: COntrol  CURRENT/MOST RECENT THERAPY: surveillaince    CLL (chronic lymphocytic leukemia) (HCC)     INTERVAL HISTORY: Alone.  Ambulating with a rolling walker.   A pleasant 82 year-old female patient with above history of History of CLL- on surveillance; and history of bilateral PE- on Eliquis is here for follow-up.  In the interim patient was evaluated with mammogram that was abnormal-which led to biopsy of the breast lesion and also biopsy of the underlying lymph nodes.  Patient continues to have chronic joint pains back pain.  Not any worse.  She denies any blood in stools or black or stools.    No night sweats.  No fevers.  No new lumps or bumps.  Chronic fatigue.  Review of Systems  Constitutional:  Positive for malaise/fatigue.  Negative for chills, diaphoresis, fever and weight loss.  HENT:  Negative for nosebleeds and sore throat.   Eyes:  Negative for double vision.  Respiratory:  Negative for cough, hemoptysis, sputum production, shortness of breath and wheezing.   Cardiovascular:  Negative for chest pain, palpitations, orthopnea and leg swelling.  Gastrointestinal:  Negative for abdominal pain, blood in stool, constipation, diarrhea, heartburn, melena, nausea and vomiting.  Genitourinary:  Negative for dysuria, frequency and urgency.  Musculoskeletal:  Positive for back pain, joint pain and neck pain.  Skin: Negative.  Negative for itching and rash.  Neurological:  Negative for dizziness, tingling, focal weakness, weakness and headaches.  Endo/Heme/Allergies:  Does not bruise/bleed easily.  Psychiatric/Behavioral:  Negative for depression. The patient is not nervous/anxious and does not have insomnia.     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)    Macular degeneration    MGUS (monoclonal gammopathy of unknown significance)    Morbid obesity (HCC)    Obesity    Pulmonary embolism (HCC)    PVD (peripheral vascular disease) (HCC)  Sleep apnea    CPAP    PAST SURGICAL HISTORY :   Past Surgical History:  Procedure Laterality Date   APPENDECTOMY     BREAST BIOPSY Left 08/19/2004   lt bx/clip-neg   BREAST BIOPSY Left 03/05/2023   [name] BX heart clip, path pending-   BREAST BIOPSY Left 03/05/2023   [name] BX hydromark coil clip axilla node- path pending   BREAST BIOPSY  Left 03/05/2023   [name] LT BREAST BX W LOC DEV 1ST LESION IMG BX SPEC US GUIDE 03/05/2023 [facility]   CATARACT EXTRACTION W/PHACO Left 09/10/2022   Procedure: CATARACT EXTRACTION PHACO AND INTRAOCULAR LENS PLACEMENT (IOC) LEFT  5.88  00:31.9;  Surgeon: Galen Manila, MD;  Location: [facility];  Service: Ophthalmology;  Laterality: Left;  sleep apnea   COLONOSCOPY     COLONOSCOPY WITH PROPOFOL N/A 03/10/2017   Procedure: COLONOSCOPY WITH PROPOFOL;  Surgeon: Christena Deem, MD;  Location: [facility] ENDOSCOPY;  Service: Endoscopy;  Laterality: N/A;   ESOPHAGOGASTRODUODENOSCOPY (EGD) WITH PROPOFOL N/A 02/02/2019   Procedure: ESOPHAGOGASTRODUODENOSCOPY (EGD) WITH PROPOFOL;  Surgeon: Toledo, Boykin Nearing, MD;  Location: [facility] ENDOSCOPY;  Service: Endoscopy;  Laterality: N/A;   IVC FILTER INSERTION N/A 06/11/2016   Procedure: IVC Filter Insertion;  Surgeon: Renford Dills, MD;  Location: [facility] INVASIVE CV LAB;  Service: Cardiovascular;  Laterality: N/A;   IVC FILTER REMOVAL N/A 05/06/2017   Procedure: IVC FILTER REMOVAL;  Surgeon: Renford Dills, MD;  Location: [facility] INVASIVE CV LAB;  Service: Cardiovascular;  Laterality: N/A;   JOINT REPLACEMENT Left 06/17/2016   Knees, pt stated left and right have been replaced.   LAPAROSCOPIC APPENDECTOMY N/A 10/10/2018   Procedure: APPENDECTOMY LAPAROSCOPIC ATTEMPTED CONVERTED TO OPEN;  Surgeon: Carolan Shiver, MD;  Location: [facility] ORS;  Service: General;  Laterality: N/A;   LAPAROSCOPY N/A 10/10/2018   Procedure: LAPAROSCOPY DIAGNOSTIC ATTEMPTED CONVERTED TO OPEN;  Surgeon: Carolan Shiver, MD;  Location: [facility] ORS;  Service: General;  Laterality: N/A;   LUMBAR LAMINECTOMY/DECOMPRESSION MICRODISCECTOMY N/A 07/09/2021   Procedure: Laminectomy and Foraminotomy - Lumbar Two-Lumbar Three - Lumbar Three-Lumbar Four - Lumbar Four-Lumbar Five;  Surgeon: Julio Sicks, MD;  Location: [facility] OR;  Service: Neurosurgery;  Laterality: N/A;   neck fusion      PARTIAL COLECTOMY Right 10/10/2018   Procedure: PARTIAL COLECTOMY;  Surgeon: Carolan Shiver, MD;  Location: [facility] ORS;  Service: General;  Laterality: Right;   PERIPHERAL VASCULAR CATHETERIZATION N/A 08/15/2015   Procedure: pulmonary angiogram with lysis;  Surgeon: Renford Dills, MD;  Location: [facility] INVASIVE CV LAB;  Service: Cardiovascular;  Laterality: N/A;   WRIST SURGERY Left 2000    FAMILY HISTORY :   Family History  Problem Relation Age of Onset   Pulmonary embolism Mother    Sudden death Mother    Obesity Mother    Heart failure Father    Congestive Heart Failure Father    Sudden death Father    Thyroid cancer Brother    Breast cancer Neg Hx     SOCIAL HISTORY:   Social History   Tobacco Use   Smoking status: Never   Smokeless tobacco: Never  Vaping Use   Vaping status: Never Used  Substance Use Topics   Alcohol use: No    Alcohol/week: 0.0 standard drinks of alcohol   Drug use: No    ALLERGIES:  has No Known Allergies.  MEDICATIONS:  Current Outpatient Medications  Medication Sig Dispense Refill   acetaminophen (TYLENOL) 650 MG CR tablet Take 650-1,300 mg  by mouth every 8 (eight) hours as needed for pain. TYLENOL ARTHRITIS     apixaban (ELIQUIS) 5 MG TABS tablet Take 1 tablet (5 mg total) by mouth 2 (two) times daily. 180 tablet 3   b complex vitamins capsule Take 1 capsule by mouth daily.     calcium carbonate (OSCAL) 1500 (600 Ca) MG TABS tablet Take 1,500 mg by mouth 2 (two) times daily with a meal.     Cholecalciferol (VITAMIN D-3) 125 MCG (5000 UT) TABS Take by mouth daily.     cholestyramine (QUESTRAN) 4 g packet Take 1 packet (4 g total) by mouth 3 (three) times daily with meals. (Patient taking differently: Take 4 g by mouth 3 (three) times daily as needed.) 60 each 0   DULoxetine (CYMBALTA) 60 MG capsule Take 1 capsule (60 mg total) by mouth daily. With Other SSRI 90 capsule 3   escitalopram (LEXAPRO) 20 MG tablet Take 1 tablet by mouth  daily.     gabapentin (NEURONTIN) 400 MG capsule Take 1 capsule (400 mg total) by mouth 4 (four) times daily. Take 1 pill in Am and 2 pills at night- for nerve pain 360 capsule 1   HYDROcodone-acetaminophen (NORCO) 10-325 MG tablet Take 1 tablet by mouth every 6 (six) hours as needed. (Patient taking differently: Take 1 tablet by mouth daily.) 120 tablet 0   losartan-hydrochlorothiazide (HYZAAR) 100-12.5 MG tablet Take 0.5 tablets by mouth daily. Reported on 08/22/2015 30 tablet    magnesium gluconate (MAGONATE) 500 MG tablet Take 500 mg by mouth 2 (two) times daily.     methocarbamol (ROBAXIN) 500 MG tablet TAKE 1 TABLET (500 MG TOTAL) BY MOUTH EVERY 8 (EIGHT) HOURS AS NEEDED FOR MUSCLE SPASMS. 180 tablet 5   Multiple Vitamins-Minerals (PRESERVISION AREDS 2 PO) Take 1 tablet by mouth in the morning and at bedtime.     pravastatin (PRAVACHOL) 40 MG tablet Take 1 tablet by mouth daily.     propranolol (INDERAL) 40 MG tablet Take 40 mg by mouth daily.     sucralfate (CARAFATE) 1 g tablet Take 1 g by mouth daily.     TURMERIC CURCUMIN PO Take 1 capsule by mouth in the morning and at bedtime.     No current facility-administered medications for this visit.    PHYSICAL EXAMINATION:   BP 128/85 (BP Location: Right Arm, Patient Position: Sitting, Cuff Size: Large)   Pulse 73   Temp (!) 95.1 F (35.1 C) (Tympanic)   Ht 5\' 4"  (1.626 m)   Wt 240 lb 9.6 oz (109.1 kg)   SpO2 97%   BMI 41.30 kg/m   Filed Weights   04/01/23 1453  Weight: 240 lb 9.6 oz (109.1 kg)      Physical Exam Constitutional:      Comments: Obese.  Walks by herself.  HENT:     Head: Normocephalic and atraumatic.     Mouth/Throat:     Pharynx: No oropharyngeal exudate.  Eyes:     Pupils: Pupils are equal, round, and reactive to light.  Cardiovascular:     Rate and Rhythm: Normal rate and regular rhythm.  Pulmonary:     Effort: No respiratory distress.     Breath sounds: No wheezing.  Abdominal:     General: Bowel  sounds are normal. There is no distension.     Palpations: Abdomen is soft. There is no mass.     Tenderness: There is no abdominal tenderness. There is no guarding or rebound.  Musculoskeletal:  General: No tenderness. Normal range of motion.     Cervical back: Normal range of motion and neck supple.  Skin:    General: Skin is warm.     Comments: Chronic bruises.  Neurological:     Mental Status: She is alert and oriented to person, place, and time.  Psychiatric:        Mood and Affect: Affect normal.      LABORATORY DATA:  I have reviewed the data as listed    Component Value Date/Time   NA 142 11/18/2022 1009   NA 142 12/26/2020 1112   NA 141 04/29/2012 1822   K 3.9 11/18/2022 1009   K 3.9 04/29/2012 1822   CL 105 11/18/2022 1009   CL 107 04/29/2012 1822   CO2 28 11/18/2022 1009   CO2 28 04/29/2012 1822   GLUCOSE 115 (H) 11/18/2022 1009   GLUCOSE 129 (H) 04/29/2012 1822   BUN 20 11/18/2022 1009   BUN 15 12/26/2020 1112   BUN 21 (H) 04/29/2012 1822   CREATININE 0.97 11/18/2022 1009   CREATININE 1.02 (H) 07/28/2014 0950   CALCIUM 9.0 11/18/2022 1009   CALCIUM 8.7 04/29/2012 1822   PROT 6.2 (L) 11/18/2022 1009   PROT 5.8 (L) 12/26/2020 1112   PROT 7.1 04/29/2012 1822   ALBUMIN 3.7 11/18/2022 1009   ALBUMIN 4.0 12/26/2020 1112   ALBUMIN 3.3 (L) 04/29/2012 1822   AST 19 11/18/2022 1009   AST 20 04/29/2012 1822   ALT 11 11/18/2022 1009   ALT 33 04/29/2012 1822   ALKPHOS 92 11/18/2022 1009   ALKPHOS 169 (H) 04/29/2012 1822   BILITOT 0.5 11/18/2022 1009   BILITOT 0.3 12/26/2020 1112   BILITOT 0.3 04/29/2012 1822   GFRNONAA 59 (L) 11/18/2022 1009   GFRNONAA 55 (L) 07/28/2014 0950   GFRAA 55 (L) 09/03/2019 0830   GFRAA >60 07/28/2014 0950    No results found for: "SPEP", "UPEP"  Lab Results  Component Value Date   WBC 12.1 (H) 11/18/2022   NEUTROABS 3.6 11/18/2022   HGB 12.4 11/18/2022   HCT 39.0 11/18/2022   MCV 93.5 11/18/2022   PLT 270 11/18/2022       Chemistry      Component Value Date/Time   NA 142 11/18/2022 1009   NA 142 12/26/2020 1112   NA 141 04/29/2012 1822   K 3.9 11/18/2022 1009   K 3.9 04/29/2012 1822   CL 105 11/18/2022 1009   CL 107 04/29/2012 1822   CO2 28 11/18/2022 1009   CO2 28 04/29/2012 1822   BUN 20 11/18/2022 1009   BUN 15 12/26/2020 1112   BUN 21 (H) 04/29/2012 1822   CREATININE 0.97 11/18/2022 1009   CREATININE 1.02 (H) 07/28/2014 0950      Component Value Date/Time   CALCIUM 9.0 11/18/2022 1009   CALCIUM 8.7 04/29/2012 1822   ALKPHOS 92 11/18/2022 1009   ALKPHOS 169 (H) 04/29/2012 1822   AST 19 11/18/2022 1009   AST 20 04/29/2012 1822   ALT 11 11/18/2022 1009   ALT 33 04/29/2012 1822   BILITOT 0.5 11/18/2022 1009   BILITOT 0.3 12/26/2020 1112   BILITOT 0.3 04/29/2012 1822        ASSESSMENT & PLAN:   CLL (chronic lymphocytic leukemia) (HCC) # Massive bilateral Pulmonary Embolus-on indefinite anticoagulation.  Continue Eliquis- Refill Eliquis with Dr.Anderson.  No concerns for bleeding.stable.    # Chronic low-grade B-cell lymphoproliferative disorder/CLL-hemoglobin platelets normal.  Platelets slightly elevated 14 absolute lymphocyte count  4-5,000.  Asymptomatic/continue surveillance- Mammogram/Left breast/ left AX LN:    -SMALL LYMPHOCYTIC LYMPHOMA.  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 JAN 2024- 0.4gm/dl; K/l=5-  Pending today. Again repeat in 12 months.stable.    # Fatigue-history of obstructive sleep apnea/continue CPAP.stable.    # DISPOSITION: # re-check BP # follow up/keep appt in July as planned- - MD labs-[ cbc/cmp/ldh/MM panel; K-light chains]-Dr.B     Earna Coder, MD 04/01/2023 3:44 PM

## 2023-04-04 DIAGNOSIS — M25641 Stiffness of right hand, not elsewhere classified: Secondary | ICD-10-CM | POA: Diagnosis not present

## 2023-04-04 DIAGNOSIS — T8484XD Pain due to internal orthopedic prosthetic devices, implants and grafts, subsequent encounter: Secondary | ICD-10-CM | POA: Diagnosis not present

## 2023-04-04 LAB — DRUG TOX MONITOR 1 W/CONF, ORAL FLD
Amphetamines: NEGATIVE ng/mL (ref <10)
Barbiturates: NEGATIVE ng/mL (ref <10)
Benzodiazepines: NEGATIVE ng/mL (ref <0.50)
Buprenorphine: NEGATIVE ng/mL (ref <0.10)
Cocaine: NEGATIVE ng/mL (ref <5.0)
Codeine: NEGATIVE ng/mL (ref <2.5)
Dihydrocodeine: 17 ng/mL — ABNORMAL HIGH (ref <2.5)
Fentanyl: NEGATIVE ng/mL (ref <0.10)
Heroin Metabolite: NEGATIVE ng/mL (ref <1.0)
Hydrocodone: 124.4 ng/mL — ABNORMAL HIGH (ref <2.5)
Hydromorphone: NEGATIVE ng/mL (ref <2.5)
MARIJUANA: NEGATIVE ng/mL (ref <2.5)
MDMA: NEGATIVE ng/mL (ref <10)
Meprobamate: NEGATIVE ng/mL (ref <2.5)
Methadone: NEGATIVE ng/mL (ref <5.0)
Morphine: NEGATIVE ng/mL (ref <2.5)
Nicotine Metabolite: NEGATIVE ng/mL (ref <5.0)
Norhydrocodone: 15.2 ng/mL — ABNORMAL HIGH (ref <2.5)
Noroxycodone: NEGATIVE ng/mL (ref <2.5)
Opiates: POSITIVE ng/mL — AB (ref <2.5)
Oxycodone: NEGATIVE ng/mL (ref <2.5)
Oxymorphone: NEGATIVE ng/mL (ref <2.5)
Phencyclidine: NEGATIVE ng/mL (ref <10)
Tapentadol: NEGATIVE ng/mL (ref <5.0)
Tramadol: NEGATIVE ng/mL (ref <5.0)
Zolpidem: NEGATIVE ng/mL (ref <5.0)

## 2023-04-04 LAB — DRUG TOX ALC METAB W/CON, ORAL FLD: Alcohol Metabolite: NEGATIVE ng/mL (ref <25)

## 2023-04-10 DIAGNOSIS — T8484XD Pain due to internal orthopedic prosthetic devices, implants and grafts, subsequent encounter: Secondary | ICD-10-CM | POA: Diagnosis not present

## 2023-04-10 DIAGNOSIS — M25641 Stiffness of right hand, not elsewhere classified: Secondary | ICD-10-CM | POA: Diagnosis not present

## 2023-04-11 DIAGNOSIS — Z961 Presence of intraocular lens: Secondary | ICD-10-CM | POA: Diagnosis not present

## 2023-04-11 DIAGNOSIS — Z0100 Encounter for examination of eyes and vision without abnormal findings: Secondary | ICD-10-CM | POA: Diagnosis not present

## 2023-04-11 DIAGNOSIS — H353114 Nonexudative age-related macular degeneration, right eye, advanced atrophic with subfoveal involvement: Secondary | ICD-10-CM | POA: Diagnosis not present

## 2023-04-11 DIAGNOSIS — H43813 Vitreous degeneration, bilateral: Secondary | ICD-10-CM | POA: Diagnosis not present

## 2023-04-11 DIAGNOSIS — H353221 Exudative age-related macular degeneration, left eye, with active choroidal neovascularization: Secondary | ICD-10-CM | POA: Diagnosis not present

## 2023-04-22 DIAGNOSIS — T8484XD Pain due to internal orthopedic prosthetic devices, implants and grafts, subsequent encounter: Secondary | ICD-10-CM | POA: Diagnosis not present

## 2023-04-22 DIAGNOSIS — M25641 Stiffness of right hand, not elsewhere classified: Secondary | ICD-10-CM | POA: Diagnosis not present

## 2023-04-29 DIAGNOSIS — T8484XD Pain due to internal orthopedic prosthetic devices, implants and grafts, subsequent encounter: Secondary | ICD-10-CM | POA: Diagnosis not present

## 2023-04-29 DIAGNOSIS — M25641 Stiffness of right hand, not elsewhere classified: Secondary | ICD-10-CM | POA: Diagnosis not present

## 2023-05-05 DIAGNOSIS — T8484XD Pain due to internal orthopedic prosthetic devices, implants and grafts, subsequent encounter: Secondary | ICD-10-CM | POA: Diagnosis not present

## 2023-05-05 DIAGNOSIS — M25641 Stiffness of right hand, not elsewhere classified: Secondary | ICD-10-CM | POA: Diagnosis not present

## 2023-05-13 DIAGNOSIS — T8484XD Pain due to internal orthopedic prosthetic devices, implants and grafts, subsequent encounter: Secondary | ICD-10-CM | POA: Diagnosis not present

## 2023-05-13 DIAGNOSIS — M25641 Stiffness of right hand, not elsewhere classified: Secondary | ICD-10-CM | POA: Diagnosis not present

## 2023-05-19 DIAGNOSIS — T8484XD Pain due to internal orthopedic prosthetic devices, implants and grafts, subsequent encounter: Secondary | ICD-10-CM | POA: Diagnosis not present

## 2023-05-19 DIAGNOSIS — M25641 Stiffness of right hand, not elsewhere classified: Secondary | ICD-10-CM | POA: Diagnosis not present

## 2023-05-22 ENCOUNTER — Other Ambulatory Visit: Payer: Self-pay | Admitting: Physical Medicine and Rehabilitation

## 2023-06-02 DIAGNOSIS — T8484XD Pain due to internal orthopedic prosthetic devices, implants and grafts, subsequent encounter: Secondary | ICD-10-CM | POA: Diagnosis not present

## 2023-06-02 DIAGNOSIS — M25641 Stiffness of right hand, not elsewhere classified: Secondary | ICD-10-CM | POA: Diagnosis not present

## 2023-06-09 DIAGNOSIS — H353221 Exudative age-related macular degeneration, left eye, with active choroidal neovascularization: Secondary | ICD-10-CM | POA: Diagnosis not present

## 2023-06-09 DIAGNOSIS — H353114 Nonexudative age-related macular degeneration, right eye, advanced atrophic with subfoveal involvement: Secondary | ICD-10-CM | POA: Diagnosis not present

## 2023-06-09 DIAGNOSIS — Z961 Presence of intraocular lens: Secondary | ICD-10-CM | POA: Diagnosis not present

## 2023-06-16 DIAGNOSIS — M25641 Stiffness of right hand, not elsewhere classified: Secondary | ICD-10-CM | POA: Diagnosis not present

## 2023-06-16 DIAGNOSIS — Z7409 Other reduced mobility: Secondary | ICD-10-CM | POA: Diagnosis not present

## 2023-06-16 DIAGNOSIS — T8484XD Pain due to internal orthopedic prosthetic devices, implants and grafts, subsequent encounter: Secondary | ICD-10-CM | POA: Diagnosis not present

## 2023-06-23 DIAGNOSIS — K224 Dyskinesia of esophagus: Secondary | ICD-10-CM | POA: Diagnosis not present

## 2023-06-23 DIAGNOSIS — K219 Gastro-esophageal reflux disease without esophagitis: Secondary | ICD-10-CM | POA: Diagnosis not present

## 2023-06-23 DIAGNOSIS — K529 Noninfective gastroenteritis and colitis, unspecified: Secondary | ICD-10-CM | POA: Diagnosis not present

## 2023-06-23 NOTE — Progress Notes (Signed)
 PATIENT PROFILE: Jamie Phillips is a 83 y.o. female who presents for a visit to the GI clinic at [facility]  for a visit to follow up  PROBLEM LIST:  Patient Active Problem List  Diagnosis  . 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 OSA on CPAP  . Pure hypercholesterolemia  . History of Bell's palsy  . GERD without esophagitis  . Generalized OA  . CLL (chronic lymphocytic leukemia) (CMS/HHS-HCC)  . Depression, major, in remission (CMS-HCC)  . Prediabetes  . Chronic pulmonary embolism (CMS/HHS-HCC)  . Healthcare maintenance  . Morbid obesity with BMI of 40.0-44.9, adult (CMS/HHS-HCC)  . RLS (restless legs syndrome)  . DDD (degenerative disc disease), lumbar  . Walker as ambulation aid  . Adnexal cyst  . Degenerative arthritis of right shoulder region    Endoscopic History:  Esophageal manometry 11/2022-  - Hypertensive lower esophageal sphincter - Hypercontractile esophagus (jackhammer esophagus).  - Mildly impaired liquid esophageal transit. - of note, the upright median IRP is elevated (unclear clinical significance)   Barium swallow 10/20- moderate esophageal dysmotility with corkscrew app[earance and spasm EGD 10/20- abnormal esophageal motility suspicious for spasm, gastritis- compatible with GAVE. There was no H pylori, Barretts, dysplasia/malignancy/celiac sprue Colonoscopy 2018 with a hyperplastic polyp found    MBSS- 09/26/19- IMPRESSION:  Unremarkable modified barium swallow. Per ST note,  is presenting with functional oropharyngeal swallowing. Oral control of the bolus including oral hold, rotary mastication, and anterior to posterior transfer is within functional limits. Timing of pharyngeal swallow initiation is within functional limits. Aspects of the pharyngeal stage of swallowing including tongue base retraction, hyolaryngeal excursion, epiglottic inversion, and duration/amplitude of UES opening are within normal limits. There is no  observed pharyngeal residue, laryngeal penetration, or tracheal aspiration. The patient is not at risk for prandial aspiration. The patient was assured that her swallowing is safe, from an aspiration perspective.  INTERVAL HISTORY:  Jamie Phillips was last seen on 01/27/23 for  Esophageal dysmotiligy/jackhammer esophagus- Altoids- 2 prior to meals-willl discuss imdur with PCP prior to initiating- chewing mods/swallowing precautions. Continue ppi as is 2. Chronic diarrhea- Stool for fecal elastase, pcr, and hydrogen test. Continue bile acid sequestrant for now. 3. Follow up 59m sooner if problemschronic diarrhea.    Since the last visit,   Continues to follow with hem-onc for her CLL Headachy today- says these are chronic and feels like they are stress related- located at the backside of her head- no nausea/ unusual photophobia- states sometimes light bothers her due to her macular degeneration. Denies fever. Her blood pressure medication has been recently decreased due to hypotension.  BP adequate today. States she never received the HBT test  She did not do the stool tests. Still having some trouble with post prandial/chronic diarrhea despite the 4g cholestyramine  before each meal. Denies rectal bleeding/melena States her dysphagia is stable. Her pcp is concerned that the imdur might lower her bp further. She did a trial of altoids- did do this but did not thiink this helped. Continues on ppi as before. Has been following some swallowing mods- eats slowly, small bites, chews well. States she is not using her hydrocodone  daily. Continues on ppi. States she has been having some memory problems She is on propranolol , snri, gabapentin , ssri. Continues on eliquis .   Medications:  Outpatient Encounter Medications as of 06/23/2023  Medication Sig Dispense Refill  . apixaban  (ELIQUIS ) 5 mg tablet TAKE 1 TABLET (5 MG TOTAL) BY MOUTH 2 (TWO) TIMES  DAILY 180 tablet 1  . b complex vitamins capsule Take 1  capsule by mouth once daily    . calcium  carbonate 600 mg calcium  (1,500 mg) Tab tablet Take 1 tablet by mouth 2 (two) times daily    . cholecalciferol (VITAMIN D3) 5,000 unit capsule Take 1 capsule by mouth daily with breakfast    . cholestyramine  (QUESTRAN ) 4 gram oral powder packet MIX 1 PACKET IN LIQUID AND DRINK THREE TIMES DAILY WITH MEALS 270 packet 10  . DULoxetine  (CYMBALTA ) 60 MG DR capsule Take 1 capsule (60 mg total) by mouth once daily 90 capsule 3  . escitalopram  oxalate (LEXAPRO ) 20 MG tablet TAKE 1 TABLET EVERY DAY 90 tablet 3  . FUROsemide  (LASIX ) 20 MG tablet Take 1 tablet (20 mg total) by mouth once daily as needed for Edema 90 tablet 3  . gabapentin  (NEURONTIN ) 400 MG capsule Take 1 capsule (400 mg total) by mouth 3 (three) times daily 270 capsule 3  . loperamide (IMODIUM) 2 mg capsule Take by mouth    . losartan  (COZAAR ) 100 MG tablet Take 1 tablet (100 mg total) by mouth once daily 90 tablet 3  . magnesium  gluconate (MAGONATE) 27.5 mg magne- sium (500 mg) tablet Take 500 mg by mouth 2 (two) times daily    . methocarbamoL  (ROBAXIN ) 500 MG tablet Take 500 mg by mouth once daily    . pantoprazole  (PROTONIX ) 40 MG DR tablet TAKE 1 TABLET TWICE DAILY BEFORE MEALS 180 tablet 3  . pravastatin  (PRAVACHOL ) 40 MG tablet Take 1 tablet (40 mg total) by mouth once daily 90 tablet 10  . propranoloL  (INDERAL ) 20 MG tablet Take 1 tablet (20 mg total) by mouth 2 (two) times daily 180 tablet 3  . sucralfate  (CARAFATE ) 1 gram tablet TAKE 1 TABLET  TWO TIMES DAILY BEFORE MEALS 180 tablet 1  . TURMERIC ORAL Take 1 capsule by mouth 2 (two) times daily    . vitamins  A,C,E-zinc-copper [id number] unit-mg-unit Take 1 capsule by mouth 2 (two) times daily       . HYDROcodone -acetaminophen  (NORCO) 5-325 mg tablet Take 1 tablet by mouth every 6 (six) hours as needed (Patient not taking: Reported on 06/23/2023)    . lidocaine  (LIDODERM ) 5 % patch Place 1 patch onto the skin Place 1 patch onto the skin  daily. 12 hors on; 12 hrs off- 8am to 8pm- apply to R low back. (Patient not taking: Reported on 06/23/2023)    . rifAXIMin (XIFAXAN) 550 mg tablet Take 1 tablet (550 mg total) by mouth 3 (three) times daily for 14 days 42 tablet 0   No facility-administered encounter medications on file as of 06/23/2023.     ALLERGIES: Morphine  and Oxycodone    PMHx:  Past Medical History:  Diagnosis Date  . Anxiety   . Atypical chest pain   . Cervical disc disease   . Chronic kidney disease   . Chronic low back pain   . Chronic pulmonary embolism (CMS/HHS-HCC) 08/25/2015   4-17, eliquis  started: filter to come out after 9-18 knee surgery  . CLL (chronic lymphocytic leukemia) (CMS/HHS-HCC) 01/23/2014  . Depression   . Depression, major, in remission (CMS-HCC) 02/06/2014  . Fibroid   . GERD without esophagitis 01/23/2014  . HA (headache)   . History of Bell's palsy 01/23/2014  . Hyperglycemia 10/13/2014  . Hyperlipidemia   . Hypertension   . Lymphocytosis    CLL versus myeloproliferative disorder  . Macular degeneration   . Morbid obesity with BMI  of 45.0-49.9, adult (CMS/HHS-HCC) 02/10/2014  . Obesity   . OSA on CPAP 01/23/2014  . Osteoarthritis   . Ovarian cyst   . Peripheral vascular disease of lower extremity (CMS-HCC)   . Pneumonia   . Pulmonary embolism (CMS/HHS-HCC) 08/14/2015  . Sleep apnea   . Status post left knee replacement 06/21/2016  . Status post total right knee replacement using cement 01/30/2017     PSHx: Past Surgical History:  Procedure Laterality Date  . COLONOSCOPY  08/28/2006   Hyperplastic colon polyp  . pulmonary angiogram with lysis  08/15/2015  . JOINT REPLACEMENT Left 06/17/2016   mako total knee  . JOINT REPLACEMENT Right 01/13/2017   [facility]KO Dr. [facility]  . COLONOSCOPY  03/10/2017   HP Repeat 79yrs if desired by patient MUS   . LAPAROSCOPIC APPENDECTOMY  10/10/2018   Dr Lucas Catchings   . colon surgery Right 10/10/2018   Dr. Lucas Catchings   . EGD   02/02/2019   GAVE/Diffuse esophageal spasm/Repeat 33yrs/TKT  . COLON SURGERY    . DILATION AND CURETTAGE, DIAGNOSTIC / THERAPEUTIC    . Open reduction internal fixation of the left wrist with pin placement       FAMILY HISTORY: Family History  Problem Relation Name Age of Onset  . Stroke Mother    . Heart failure Mother    . Coronary Artery Disease (Blocked arteries around heart) Father    . Pulmonary embolism Father    . Thyroid  cancer Brother       Social History: Social History   Socioeconomic History  . Marital status: Widowed  . Number of children: 4  . Years of education: 12  Occupational History  . Occupation: Retired  Tobacco Use  . Smoking status: Never  . Smokeless tobacco: Never  Vaping Use  . Vaping status: Never Used  Substance and Sexual Activity  . Alcohol use: No  . Drug use: No  . Sexual activity: Not Currently    Partners: Male    Birth control/protection: Post-menopausal   Social Drivers of Health   Financial Resource Strain: Low Risk  (01/13/2023)   Overall Financial Resource Strain (CARDIA)   . Difficulty of Paying Living Expenses: Not hard at all  Food Insecurity: No Food Insecurity (01/13/2023)   Hunger Vital Sign   . Worried About Programme researcher, broadcasting/film/video in the Last Year: Never true   . Ran Out of Food in the Last Year: Never true  Transportation Needs: No Transportation Needs (01/13/2023)   PRAPARE - Transportation   . Lack of Transportation (Medical): No   . Lack of Transportation (Non-Medical): No     REVIEW OF SYSTEMS:   10 systems reviewed, unremarkable other than what is written above with exception of chronic pain, intermittent sob, weakness, difficult ambulation due to back pain.    PHYSICAL EXAM:  Vitals:   06/23/23 1005  BP: 139/83  Pulse: 62  Temp: 36.1 C (97 F)   Body mass index is 40.25 kg/m. Weight: (!) 109.6 kg (241 lb 9.6 oz)   General Appearance:    Alert, cooperative, no distress, appears stated age Head:      Atraumatic, normocephalic Eyes:   Anciteric, conjunctiva pink. Mouth: no oral ulcers Lungs:     Respirations eupneic.  Clear to auscultation bilaterally  Heart:    Regular rate and rhythm, S1 and S2 normal, no murmur, rub   or gallop Abdomen:     Well healed midline scarFlat, bowel sounds x 4.Soft, non-tender/nondistended. No  guarding, rigidity, rebound tenderness or other peritoneal signs Extremities:   No cyanosis or edema, moves all extremities well. Strength 5/5. Skin:   Skin color, texture, turgor normal, no rashes or lesions Neuro: alert, oriented x 3, cranial nerves II-XII intact Psych: pleasant, calm, cooperative, Logical thought and good insight.    REVIEW OF DATA: I have reviewed the following data today:  Prior notes labs endsocopy reports imaging    ASSESSMENT AND PLAN:   Jackhammer esophagus/gerd-  continue pantoprazole  bid. Swallowing mods discsussed- she should continue these for now. 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 Consider epi testing v other if no better at follow up Follow up in about 8w sooner if problems

## 2023-06-25 DIAGNOSIS — M25641 Stiffness of right hand, not elsewhere classified: Secondary | ICD-10-CM | POA: Diagnosis not present

## 2023-06-25 DIAGNOSIS — T8484XD Pain due to internal orthopedic prosthetic devices, implants and grafts, subsequent encounter: Secondary | ICD-10-CM | POA: Diagnosis not present

## 2023-07-04 ENCOUNTER — Encounter: Payer: Medicare HMO | Attending: Physical Medicine and Rehabilitation | Admitting: Physical Medicine and Rehabilitation

## 2023-07-04 ENCOUNTER — Encounter: Payer: Self-pay | Admitting: Physical Medicine and Rehabilitation

## 2023-07-04 VITALS — BP 110/75 | HR 63 | Ht 64.0 in | Wt 243.0 lb

## 2023-07-04 DIAGNOSIS — M5442 Lumbago with sciatica, left side: Secondary | ICD-10-CM | POA: Diagnosis not present

## 2023-07-04 DIAGNOSIS — G8929 Other chronic pain: Secondary | ICD-10-CM | POA: Insufficient documentation

## 2023-07-04 DIAGNOSIS — C9110 Chronic lymphocytic leukemia of B-cell type not having achieved remission: Secondary | ICD-10-CM

## 2023-07-04 DIAGNOSIS — M5441 Lumbago with sciatica, right side: Secondary | ICD-10-CM

## 2023-07-04 DIAGNOSIS — G894 Chronic pain syndrome: Secondary | ICD-10-CM

## 2023-07-04 DIAGNOSIS — M48062 Spinal stenosis, lumbar region with neurogenic claudication: Secondary | ICD-10-CM

## 2023-07-04 MED ORDER — HYDROCODONE-ACETAMINOPHEN 10-325 MG PO TABS: 1.0000 | ORAL_TABLET | Freq: Four times a day (QID) | ORAL | 0 refills | Status: DC | PRN

## 2023-07-04 NOTE — Patient Instructions (Signed)
 Pt is a 83 yr old female with BMI of 41, CLL; hx of Bell's palsy; central lumbar stenosis and radiculopathy- hx of Lumbar lami L2-L5; HTN- but not lately. 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    Refill Norco 10/325 mg up to 4x/day-  Call me when needs refills of pain meds- can call every 1 month if need be.   - FILL every month if need be!  2. Con't Methocarbamol 500 mg 3x/day as needed- has refills - 05/23/23 last refill.    3. Con't Gabapentin 400 mg 4x/day-  - last refill 03/31/23-    4. Con't Duloxetine for nerve pain- 60 mg daily.  Refilled 03/31/23   5. Went over lumbar CT and lumbar xray- with pt and explained why she is having pain.   6. F/U in 2 months- with Riley Lam and 4 months with Me- for chronic pain due to trauma and CLL.

## 2023-07-04 NOTE — Progress Notes (Signed)
 Subjective:    Patient ID: Jamie Phillips, female    DOB: 12-03-1940, 83 y.o.   MRN: [id number]  HPI  Pt is a 83 yr old female with BMI of 41, CLL; hx of Bell's palsy; central lumbar stenosis and radiculopathy- hx of Lumbar lami L2-L5; HTN- but not lately. 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    Had 2 teeth pulled today- last time since teenager- one of them had filling, but filling fell out.  Hurting some though.  Back hurts so bad, that hard to tell if Teeth hurting.     Scared to take norco- so scared to get addicted - some days, hurts so bad, takes 2 tylenol and Norco-  Usually maybe a few times per week.  Hasn't had any Norco in a few weeks.  120 tabs lasted 3 months. So trying to last "til next appt".    Has had diarrhea - if doesn't take a "powder"- Questran stools are diarrhea- and has to take powder with every meals so can have formed stool.   Just got flagyl yesterday- 1 tab 3x/day for 14 days- thinks due to diarrhea.  Liquid worse to swallow than solid- and GI seeing her for this.   Hurts in one spot on back - more to R side, but also middle.  Ever since surgery, things better, but has been hurting in that 1 spot since came out of surgery.     Pain Inventory Average Pain 5 Pain Right Now 5 My pain is constant, sharp, and throbbing  In the last 24 hours, has pain interfered with the following? General activity 5 Relation with others 0 Enjoyment of life 5 What TIME of day is your pain at its worst? morning , daytime, evening, and night Sleep (in general) Poor  Pain is worse with: walking, bending, sitting, standing, and some activites Pain improves with: rest and medication Relief from Meds: 4  Family History  Problem Relation Age of Onset   Pulmonary embolism Mother    Sudden death Mother    Obesity Mother     Heart failure Father    Congestive Heart Failure Father    Sudden death Father    Thyroid cancer Brother    Breast cancer Neg Hx    Social History   Socioeconomic History   Marital status: Widowed    Spouse name: Not on file   Number of children: Not on file   Years of education: Not on file   Highest education level: Not on file  Occupational History   Occupation: Retired  Tobacco Use   Smoking status: Never   Smokeless tobacco: Never  Vaping Use   Vaping status: Never Used  Substance and Sexual Activity   Alcohol use: No    Alcohol/week: 0.0 standard drinks of alcohol   Drug use: No   Sexual activity: Not on file  Other Topics Concern   Not on file  Social History Narrative   ** Merged History Encounter **       Social Drivers of Health   Financial Resource Strain: Low Risk  (01/13/2023)   Received from [facility]   Overall Financial Resource Strain (CARDIA)    Difficulty of Paying Living Expenses: Not hard at all  Food Insecurity: No Food Insecurity (01/13/2023)   Received from [facility]  [facility]   Hunger Vital Sign    Worried About Running Out of Food in the Last Year: Never true    Ran Out of Food in the Last Year: Never true  Transportation Needs: No Transportation Needs (01/13/2023)   Received from [facility] - Transportation    In the past 12 months, has lack of transportation kept you from medical appointments or from getting medications?: No    Lack of Transportation (Non-Medical): No  Physical Activity: Unknown (03/18/2017)   Received from [facility], [facility]   Exercise Vital Sign    Days of Exercise per Week: Patient declined    Minutes of Exercise per Session: Patient declined  Stress: Unknown (03/18/2017)   Received from [facility], [facility]   [employer] [facility] - Occupational Stress  Questionnaire    Feeling of Stress : Patient declined  Social Connections: Unknown (03/18/2017)   Received from [facility], [facility]   Social Connection and Isolation Panel [NHANES]    Frequency of Communication with Friends and Family: Patient declined    Frequency of Social Gatherings with Friends and Family: Patient declined    Attends Religious Services: Patient declined    Active Member of Clubs or Organizations: Patient declined    Attends Engineer, structural: Patient declined    Marital Status: Patient declined   Past Surgical History:  Procedure Laterality Date   APPENDECTOMY     BREAST BIOPSY Left 08/19/2004   lt bx/clip-neg   BREAST BIOPSY Left 03/05/2023   [name] BX heart clip, path pending-   BREAST BIOPSY Left 03/05/2023   [name] BX hydromark coil clip axilla node- path pending   BREAST BIOPSY Left 03/05/2023   [name] LT BREAST BX W LOC DEV 1ST LESION IMG BX SPEC US GUIDE 03/05/2023 [facility]   CATARACT EXTRACTION W/PHACO Left 09/10/2022   Procedure: CATARACT EXTRACTION PHACO AND INTRAOCULAR LENS PLACEMENT (IOC) LEFT  5.88  00:31.9;  Surgeon: Galen Manila, MD;  Location: [facility];  Service: Ophthalmology;  Laterality: Left;  sleep apnea   COLONOSCOPY     COLONOSCOPY WITH PROPOFOL N/A 03/10/2017   Procedure: COLONOSCOPY WITH PROPOFOL;  Surgeon: Christena Deem, MD;  Location: [facility] ENDOSCOPY;  Service: Endoscopy;  Laterality: N/A;   ESOPHAGOGASTRODUODENOSCOPY (EGD) WITH PROPOFOL N/A 02/02/2019   Procedure: ESOPHAGOGASTRODUODENOSCOPY (EGD) WITH PROPOFOL;  Surgeon: Toledo, Boykin Nearing, MD;  Location: [facility] ENDOSCOPY;  Service: Endoscopy;  Laterality: N/A;   IVC FILTER INSERTION N/A 06/11/2016   Procedure: IVC Filter Insertion;  Surgeon: Renford Dills, MD;  Location: [facility] INVASIVE CV LAB;  Service: Cardiovascular;  Laterality: N/A;   IVC FILTER REMOVAL N/A 05/06/2017   Procedure: IVC FILTER REMOVAL;  Surgeon:  Renford Dills, MD;  Location: [facility];  Service: Cardiovascular;  Laterality: N/A;   JOINT REPLACEMENT Left 06/17/2016   Knees, pt stated left and right have been replaced.   LAPAROSCOPIC APPENDECTOMY N/A 10/10/2018   Procedure: APPENDECTOMY LAPAROSCOPIC ATTEMPTED CONVERTED TO OPEN;  Surgeon: Carolan Shiver, MD;  Location: [facility];  Service: General;  Laterality: N/A;   LAPAROSCOPY N/A 10/10/2018   Procedure: LAPAROSCOPY DIAGNOSTIC ATTEMPTED CONVERTED TO OPEN;  Surgeon: Carolan Shiver, MD;  Location: [facility];  Service: General;  Laterality: N/A;   LUMBAR LAMINECTOMY/DECOMPRESSION MICRODISCECTOMY N/A 07/09/2021   Procedure: Laminectomy and Foraminotomy - Lumbar Two-Lumbar Three - Lumbar Three-Lumbar Four - Lumbar Four-Lumbar  Five;  Surgeon: Julio Sicks, MD;  Location: [facility] OR;  Service: Neurosurgery;  Laterality: N/A;   neck fusion     PARTIAL COLECTOMY Right 10/10/2018   Procedure: PARTIAL COLECTOMY;  Surgeon: Carolan Shiver, MD;  Location: [facility];  Service: General;  Laterality: Right;   PERIPHERAL VASCULAR CATHETERIZATION N/A 08/15/2015   Procedure: pulmonary angiogram with lysis;  Surgeon: Renford Dills, MD;  Location: [facility] INVASIVE CV LAB;  Service: Cardiovascular;  Laterality: N/A;   WRIST SURGERY Left 2000   Past Surgical History:  Procedure Laterality Date   APPENDECTOMY     BREAST BIOPSY Left 08/19/2004   lt bx/clip-neg   BREAST BIOPSY Left 03/05/2023   [name] BX heart clip, path pending-   BREAST BIOPSY Left 03/05/2023   [name] BX hydromark coil clip axilla node- path pending   BREAST BIOPSY Left 03/05/2023   [name] LT BREAST BX W LOC DEV 1ST LESION IMG BX SPEC US GUIDE 03/05/2023 [facility]   CATARACT EXTRACTION W/PHACO Left 09/10/2022   Procedure: CATARACT EXTRACTION PHACO AND INTRAOCULAR LENS PLACEMENT (IOC) LEFT  5.88  00:31.9;  Surgeon: Galen Manila, MD;  Location: [facility];  Service: Ophthalmology;  Laterality: Left;   sleep apnea   COLONOSCOPY     COLONOSCOPY WITH PROPOFOL N/A 03/10/2017   Procedure: COLONOSCOPY WITH PROPOFOL;  Surgeon: Christena Deem, MD;  Location: [facility] ENDOSCOPY;  Service: Endoscopy;  Laterality: N/A;   ESOPHAGOGASTRODUODENOSCOPY (EGD) WITH PROPOFOL N/A 02/02/2019   Procedure: ESOPHAGOGASTRODUODENOSCOPY (EGD) WITH PROPOFOL;  Surgeon: Toledo, Boykin Nearing, MD;  Location: [facility];  Service: Endoscopy;  Laterality: N/A;   IVC FILTER INSERTION N/A 06/11/2016   Procedure: IVC Filter Insertion;  Surgeon: Renford Dills, MD;  Location: [facility];  Service: Cardiovascular;  Laterality: N/A;   IVC FILTER REMOVAL N/A 05/06/2017   Procedure: IVC FILTER REMOVAL;  Surgeon: Renford Dills, MD;  Location: [facility];  Service: Cardiovascular;  Laterality: N/A;   JOINT REPLACEMENT Left 06/17/2016   Knees, pt stated left and right have been replaced.   LAPAROSCOPIC APPENDECTOMY N/A 10/10/2018   Procedure: APPENDECTOMY LAPAROSCOPIC ATTEMPTED CONVERTED TO OPEN;  Surgeon: Carolan Shiver, MD;  Location: [facility];  Service: General;  Laterality: N/A;   LAPAROSCOPY N/A 10/10/2018   Procedure: LAPAROSCOPY DIAGNOSTIC ATTEMPTED CONVERTED TO OPEN;  Surgeon: Carolan Shiver, MD;  Location: [facility];  Service: General;  Laterality: N/A;   LUMBAR LAMINECTOMY/DECOMPRESSION MICRODISCECTOMY N/A 07/09/2021   Procedure: Laminectomy and Foraminotomy - Lumbar Two-Lumbar Three - Lumbar Three-Lumbar Four - Lumbar Four-Lumbar Five;  Surgeon: Julio Sicks, MD;  Location: [facility];  Service: Neurosurgery;  Laterality: N/A;   neck fusion     PARTIAL COLECTOMY Right 10/10/2018   Procedure: PARTIAL COLECTOMY;  Surgeon: Carolan Shiver, MD;  Location: [facility];  Service: General;  Laterality: Right;   PERIPHERAL VASCULAR CATHETERIZATION N/A 08/15/2015   Procedure: pulmonary angiogram with lysis;  Surgeon: Renford Dills, MD;  Location: 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)    Macular degeneration    MGUS (monoclonal gammopathy of unknown significance)    Morbid obesity (HCC)    Obesity    Pulmonary embolism (HCC)    PVD (peripheral vascular disease) (HCC)    Sleep apnea    CPAP   BP 110/75   Pulse 63   Ht 5\' 4"  (1.626 m)   Wt 243 lb (110.2 kg)   SpO2 92%   BMI 41.71 kg/m   Opioid Risk Score:   Fall Risk Score:  `1  Depression screen PHQ 2/9     07/04/2023    2:03 PM 03/31/2023    2:03 PM 09/23/2022    1:07 PM 06/24/2022    1:46 PM 03/11/2022    1:37 PM 11/16/2021   11:18 AM 11/05/2021   10:42 AM  Depression screen PHQ 2/9  Decreased Interest 3 0 0 1 0 2 0  Down, Depressed, Hopeless 3 0 0 1 0 0 0  PHQ - 2 Score 6 0 0 2 0 2 0  Altered sleeping      1   Tired, decreased energy      1   Change in appetite      0   Feeling bad or failure about yourself       0   Trouble concentrating      0   Moving slowly or fidgety/restless      0   Suicidal thoughts      0   PHQ-9 Score      4   Difficult doing work/chores      Very difficult      Review of Systems  Musculoskeletal:  Positive for back pain and gait problem.  All other systems reviewed and are negative.     Objective:   Physical Exam  Awake, alert, appropriate, missing some teeth, cheek is swollen from  tooth extraction; using rolator, NAD        Assessment & Plan:   Pt is a 83 yr old female with BMI of 41, CLL; hx of Bell's palsy; central lumbar stenosis and  radiculopathy- hx of Lumbar lami L2-L5; HTN- but not lately. 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    Refill Norco 10/325 mg up to 4x/day-  Call me when needs refills of pain meds- can call every 1 month if need be.   - FILL every month if need be!  2. Con't Methocarbamol 500 mg 3x/day as needed- has refills - 05/23/23 last refill.    3. Con't Gabapentin 400 mg 4x/day-  - last refill 03/31/23-    4. Con't Duloxetine for nerve pain- 60 mg daily.  Refilled 03/31/23   5. Went over lumbar CT and lumbar xray- with pt and explained why she is having pain.   6. F/U in 2 months- with Riley Lam and 4 months with Me- for chronic pain due to trauma and CLL.      I spent a total of  24  minutes on total care today- >50% coordination of care- due to d/w pt about pain meds- and can use more frequently than using- and to continue other meds we discussed- and went over  Lumbar CT and xray- we discussed Dr Jordan Likes doesn't want her to do more surgery

## 2023-07-07 DIAGNOSIS — M25641 Stiffness of right hand, not elsewhere classified: Secondary | ICD-10-CM | POA: Diagnosis not present

## 2023-07-07 DIAGNOSIS — T8484XD Pain due to internal orthopedic prosthetic devices, implants and grafts, subsequent encounter: Secondary | ICD-10-CM | POA: Diagnosis not present

## 2023-07-21 DIAGNOSIS — M25641 Stiffness of right hand, not elsewhere classified: Secondary | ICD-10-CM | POA: Diagnosis not present

## 2023-07-21 DIAGNOSIS — T8484XD Pain due to internal orthopedic prosthetic devices, implants and grafts, subsequent encounter: Secondary | ICD-10-CM | POA: Diagnosis not present

## 2023-07-29 DIAGNOSIS — I129 Hypertensive chronic kidney disease with stage 1 through stage 4 chronic kidney disease, or unspecified chronic kidney disease: Secondary | ICD-10-CM | POA: Diagnosis not present

## 2023-07-29 DIAGNOSIS — N1831 Chronic kidney disease, stage 3a: Secondary | ICD-10-CM | POA: Diagnosis not present

## 2023-07-29 DIAGNOSIS — M25511 Pain in right shoulder: Secondary | ICD-10-CM | POA: Diagnosis not present

## 2023-08-14 DIAGNOSIS — M4310 Spondylolisthesis, site unspecified: Secondary | ICD-10-CM | POA: Diagnosis not present

## 2023-08-14 DIAGNOSIS — M47816 Spondylosis without myelopathy or radiculopathy, lumbar region: Secondary | ICD-10-CM | POA: Diagnosis not present

## 2023-09-01 DIAGNOSIS — M47816 Spondylosis without myelopathy or radiculopathy, lumbar region: Secondary | ICD-10-CM | POA: Diagnosis not present

## 2023-09-03 ENCOUNTER — Encounter: Attending: Physical Medicine and Rehabilitation | Admitting: Registered Nurse

## 2023-09-03 ENCOUNTER — Encounter: Payer: Self-pay | Admitting: Registered Nurse

## 2023-09-03 VITALS — BP 102/71 | HR 52 | Ht 64.0 in

## 2023-09-03 DIAGNOSIS — G8929 Other chronic pain: Secondary | ICD-10-CM

## 2023-09-03 DIAGNOSIS — Z5181 Encounter for therapeutic drug level monitoring: Secondary | ICD-10-CM | POA: Diagnosis not present

## 2023-09-03 DIAGNOSIS — G894 Chronic pain syndrome: Secondary | ICD-10-CM | POA: Diagnosis not present

## 2023-09-03 DIAGNOSIS — M48062 Spinal stenosis, lumbar region with neurogenic claudication: Secondary | ICD-10-CM | POA: Diagnosis not present

## 2023-09-03 DIAGNOSIS — M5441 Lumbago with sciatica, right side: Secondary | ICD-10-CM | POA: Diagnosis not present

## 2023-09-03 DIAGNOSIS — Z9181 History of falling: Secondary | ICD-10-CM | POA: Diagnosis not present

## 2023-09-03 DIAGNOSIS — W19XXXA Unspecified fall, initial encounter: Secondary | ICD-10-CM

## 2023-09-03 DIAGNOSIS — R001 Bradycardia, unspecified: Secondary | ICD-10-CM | POA: Insufficient documentation

## 2023-09-03 DIAGNOSIS — M5442 Lumbago with sciatica, left side: Secondary | ICD-10-CM | POA: Diagnosis not present

## 2023-09-03 DIAGNOSIS — Z79891 Long term (current) use of opiate analgesic: Secondary | ICD-10-CM | POA: Insufficient documentation

## 2023-09-03 MED ORDER — HYDROCODONE-ACETAMINOPHEN 10-325 MG PO TABS: 1.0000 | ORAL_TABLET | Freq: Four times a day (QID) | ORAL | 0 refills | Status: DC | PRN

## 2023-09-03 NOTE — Progress Notes (Signed)
 Subjective:    Patient ID: Jamie Phillips, female    DOB: 17-Nov-1940, 83 y.o.   MRN: [id number]  HPI: Jamie Phillips is a 83 y.o. female who returns for follow up appointment for chronic pain and medication refill.  Ms. Want  was stepping off the scale and landed on her left side, granddaughter  and CME next to her. She denies hitting her head. She states she hit her left knee. Staff helped her up, she refuses ED or Urgent Care evaluation.  She states her  pain is located in her lower back. She rates her  pain 1. Her current exercise regime is walking  with her cane and performing stretching exercises.  [name] arrived bradycardic, apical pulse checked, she is prescribed Metoprolol .    [name] Morphine  equivalent is 40.00 MME.   Oral Swab was Performed today.    Pain Inventory Average Pain 8 Pain Right Now 1 My pain is aching  In the last 24 hours, has pain interfered with the following? General activity 1 Relation with others 1 Enjoyment of life 1 What TIME of day is your pain at its worst? varies Sleep (in general) NA  Pain is worse with: walking Pain improves with: unknown Relief from Meds: 2  Family History  Problem Relation Age of Onset   Pulmonary embolism Mother    Sudden death Mother    Obesity Mother    Heart failure Father    Congestive Heart Failure Father    Sudden death Father    Thyroid  cancer Brother    Breast cancer Neg Hx    Social History   Socioeconomic History   Marital status: Widowed    Spouse name: Not on file   Number of children: Not on file   Years of education: Not on file   Highest education level: Not on file  Occupational History   Occupation: Retired  Tobacco Use   Smoking status: Never   Smokeless tobacco: Never  Vaping Use   Vaping status: Never Used  Substance and Sexual Activity   Alcohol use: No    Alcohol/week: 0.0 standard drinks of alcohol   Drug use: No   Sexual activity: Not on file  Other Topics  Concern   Not on file  Social History Narrative   ** Merged History Encounter **       Social Drivers of Health   Financial Resource Strain: Low Risk  (01/13/2023)   [facility]   Overall Financial Resource Strain (CARDIA)    Difficulty of Paying Living Expenses: Not hard at all  Food Insecurity: No Food Insecurity (01/13/2023)   Received from [facility]   Hunger Vital Sign    Worried About Running Out of Food in the Last Year: Never true    Ran Out of Food in the Last Year: Never true  Transportation Needs: No Transportation Needs (01/13/2023)   Received from [facility] - Transportation    In the past 12 months, has lack of transportation kept you from medical appointments or from getting medications?: No    Lack of Transportation (Non-Medical): No  Physical Activity: Unknown (03/18/2017)   Received from [facility], [facility]   Exercise Vital Sign    Days of Exercise per Week: Patient declined    Minutes of Exercise per Session: Patient declined  Stress: Unknown (03/18/2017)   Received from [facility],  [facility]   [employer] [facility] - Occupational Stress Questionnaire    Feeling of Stress : Patient declined  Social Connections: Unknown (03/18/2017)   Received from [facility], [facility]   Social Connection and Isolation Panel [NHANES]    Frequency of Communication with Friends and Family: Patient declined    Frequency of Social Gatherings with Friends and Family: Patient declined    Attends Religious Services: Patient declined    Database administrator or Organizations: Patient declined    Attends Engineer, structural: Patient declined    Marital Status: Patient declined   Past Surgical History:  Procedure Laterality Date   APPENDECTOMY     BREAST  BIOPSY Left 08/19/2004   lt bx/clip-neg   BREAST BIOPSY Left 03/05/2023   US  BX heart clip, path pending-   BREAST BIOPSY Left 03/05/2023   US  BX hydromark coil clip axilla node- path pending   BREAST BIOPSY Left 03/05/2023   US  LT BREAST BX W LOC DEV 1ST LESION IMG BX SPEC US  GUIDE 03/05/2023 [facility]   CATARACT EXTRACTION W/PHACO Left 09/10/2022   Procedure: CATARACT EXTRACTION PHACO AND INTRAOCULAR LENS PLACEMENT (IOC) LEFT  5.88  00:31.9;  Surgeon: Clair Crews, MD;  Location: [facility];  Service: Ophthalmology;  Laterality: Left;  sleep apnea   COLONOSCOPY     COLONOSCOPY WITH PROPOFOL  N/A 03/10/2017   Procedure: COLONOSCOPY WITH PROPOFOL ;  Surgeon: Deveron Fly, MD;  Location: [facility] ENDOSCOPY;  Service: Endoscopy;  Laterality: N/A;   ESOPHAGOGASTRODUODENOSCOPY (EGD) WITH PROPOFOL  N/A 02/02/2019   Procedure: ESOPHAGOGASTRODUODENOSCOPY (EGD) WITH PROPOFOL ;  Surgeon: Toledo, Alphonsus Jeans, MD;  Location: [facility] ENDOSCOPY;  Service: Endoscopy;  Laterality: N/A;   IVC FILTER INSERTION N/A 06/11/2016   Procedure: IVC Filter Insertion;  Surgeon: Jackquelyn Mass, MD;  Location: [facility] INVASIVE CV LAB;  Service: Cardiovascular;  Laterality: N/A;   IVC FILTER REMOVAL N/A 05/06/2017   Procedure: IVC FILTER REMOVAL;  Surgeon: Jackquelyn Mass, MD;  Location: [facility] INVAS[facility] CV LAB;  Service: Cardiovascular;  Laterality: N/A;   JOINT REPLACEMENT Left 06/17/2016   Knees, pt stated left and right have been replaced.   LAPAROSCOPIC APPENDECTOMY N/A 10/10/2018   Procedure: APPENDECTOMY LAPAROSCOPIC ATTEMPTED CONVERTED TO OPEN;  Surgeon: Eldred Grego, MD;  Location: [facility] ORS;  Service: General;  Laterality: N/A;   LAPAROSCOPY N/A 10/10/2018   Procedure: LAPAROSCOPY DIAGNOSTIC ATTEMPTED CONVERTED TO OPEN;  Surgeon: Eldred Grego, MD;  Location: [facility];  Service: General;  Laterality: N/A;   LUMBAR LAMINECTOMY/DECOMPRESSION MICRODISCECTOMY N/A 07/09/2021    Procedure: Laminectomy and Foraminotomy - Lumbar Two-Lumbar Three - Lumbar Three-Lumbar Four - Lumbar Four-Lumbar Five;  Surgeon: Agustina Aldrich, MD;  [facility];  Service: Neurosurgery;  Laterality: N/A;   neck fusion     PARTIAL COLECTOMY Right 10/10/2018   Procedure: PARTIAL COLECTOMY;  Surgeon: Eldred Grego, MD;  Location: [facility];  Service: General;  Laterality: Right;   PERIPHERAL VASCULAR CATHETERIZATION N/A 08/15/2015   Procedure: pulmonary angiogram with lysis;  Surgeon: Jackquelyn Mass, MD;  Location: [facility] INVASIVE CV LAB;  Service: Cardiovascular;  Laterality: N/A;   WRIST SURGERY Left 2000   Past Surgical History:  Procedure Laterality Date   APPENDECTOMY     BREAST BIOPSY Left 08/19/2004   lt bx/clip-neg   BREAST BIOPSY Left 03/05/2023   US  BX heart clip, path pending-   BREAST BIOPSY Left 03/05/2023   US  BX hydromark coil clip axilla node- path  pending   BREAST BIOPSY Left 03/05/2023   US  LT BREAST BX W LOC DEV 1ST LESION IMG BX SPEC US  GUIDE 03/05/2023 [facility]   CATARACT EXTRACTION W/PHACO Left 09/10/2022   Procedure: CATARACT EXTRACTION PHACO AND INTRAOCULAR LENS PLACEMENT (IOC) LEFT  5.88  00:31.9;  Surgeon: Clair Crews, MD;  Location: [facility];  Service: Ophthalmology;  Laterality: Left;  sleep apnea   COLONOSCOPY     COLONOSCOPY WITH PROPOFOL  N/A 03/10/2017   Procedure: COLONOSCOPY WITH PROPOFOL ;  Surgeon: Deveron Fly, MD;  Location: [facility] ENDOSCOPY;  Service: Endoscopy;  Laterality: N/A;   ESOPHAGOGASTRODUODENOSCOPY (EGD) WITH PROPOFOL  N/A 02/02/2019   Procedure: ESOPHAGOGASTRODUODENOSCOPY (EGD) WITH PROPOFOL ;  Surgeon: Toledo, Alphonsus Jeans, MD;  Location: [facility];  Service: Endoscopy;  Laterality: N/A;   IVC FILTER INSERTION N/A 06/11/2016   Procedure: IVC Filter Insertion;  Surgeon: Jackquelyn Mass, MD;  Location: [facility];  Service: Cardiovascular;  Laterality: N/A;   IVC FILTER REMOVAL N/A 05/06/2017    Procedure: IVC FILTER REMOVAL;  Surgeon: Jackquelyn Mass, MD;  Location: [facility];  Service: Cardiovascular;  Laterality: N/A;   JOINT REPLACEMENT Left 06/17/2016   Knees, pt stated left and right have been replaced.   LAPAROSCOPIC APPENDECTOMY N/A 10/10/2018   Procedure: APPENDECTOMY LAPAROSCOPIC ATTEMPTED CONVERTED TO OPEN;  Surgeon: Eldred Grego, MD;  Location: [facility];  Service: General;  Laterality: N/A;   LAPAROSCOPY N/A 10/10/2018   Procedure: LAPAROSCOPY DIAGNOSTIC ATTEMPTED CONVERTED TO OPEN;  Surgeon: Eldred Grego, MD;  Location: [facility];  Service: General;  Laterality: N/A;   LUMBAR LAMINECTOMY/DECOMPRESSION MICRODISCECTOMY N/A 07/09/2021   Procedure: Laminectomy and Foraminotomy - Lumbar Two-Lumbar Three - Lumbar Three-Lumbar Four - Lumbar Four-Lumbar Five;  Surgeon: Agustina Aldrich, MD;  Location[facility];  Service: Neurosurgery;  Laterality: N/A;   neck fusion     PARTIAL COLECTOMY Right 10/10/2018   Procedure: PARTIAL COLECTOMY;  Surgeon: Eldred Grego, MD;  Location: [facility];  Service: General;  Laterality: Right;   PERIPHERAL VASCULAR CATHETERIZATION N/A 08/15/2015   Procedure: pulmonary angiogram with lysis;  Surgeon: Jackquelyn Mass, MD;  Location: [facility] INVASIVE CV LAB;  Service: Cardiovascular;  Laterality: N/A;   WRIST SURGERY Left 2000   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)    Macular degeneration    MGUS (monoclonal gammopathy of unknown significance)    Morbid obesity (HCC)    Obesity    Pulmonary embolism  (HCC)    PVD (peripheral vascular disease) (HCC)    Sleep apnea    CPAP   BP 102/71 (Cuff Size: Normal)   Pulse (!) 59   Ht 5\' 4"  (1.626 m)   SpO2 94%   BMI 41.71 kg/m   Opioid Risk Score:   Fall Risk Score:  `1  Depression screen [facility] 2/9     07/04/2023    2:03 PM 03/31/2023    2:03 PM 09/23/2022    1:07  PM 06/24/2022    1:46 PM 03/11/2022    1:37 PM 11/16/2021   11:18 AM 11/05/2021   10:42 AM  Depression screen PHQ 2/9  Decreased Interest 3 0 0 1 0 2 0  Down, Depressed, Hopeless 3 0 0 1 0 0 0  PHQ - 2 Score 6 0 0 2 0 2 0  Altered sleeping      1   Tired, decreased energy      1   Change in appetite      0   Feeling bad or failure about yourself       0   Trouble concentrating      0   Moving slowly or fidgety/restless      0   Suicidal thoughts      0   PHQ-9 Score      4   Difficult doing work/chores      Very difficult       Review of Systems  Musculoskeletal:  Positive for back pain.       Lower back pain       Objective:   Physical Exam Vitals and nursing note reviewed.  Constitutional:      Appearance: Normal appearance.  Cardiovascular:     Rate and Rhythm: Normal rate and regular rhythm.     Pulses: Normal pulses.     Heart sounds: Normal heart sounds.  Pulmonary:     Effort: Pulmonary effort is normal.     Breath sounds: Normal breath sounds.  Musculoskeletal:     Comments: Normal Muscle Bulk and Muscle Testing Reveals:  Upper Extremities: Right: Decreased ROM 90 Degrees  and Muscle Strength 5/5 Left Upper extremity: Full ROM and Muscle Strength 5/5  Lower Extremities: Full ROM and Muscle Strength 5/5 Arises from Table slowly Narrow Based  Gait     Skin:    General: Skin is warm and dry.     Comments: Right thumb and left knee with ecchymosis   Neurological:     Mental Status: She is alert and oriented to person, place, and time.  Psychiatric:        Mood and Affect: Mood normal.        Behavior: Behavior normal.         Assessment &  Plan:  Chronic Low Back Pain w/ sciatica: Lumbar Spinal Stenosis: Continue HEP as Tolerated. Continue to monitor.  Bradycardia: Apical Pulse checked, Patient prescribed Metoprolol . Continue to Monitor.  Fall/ initial encounter at Scale: Educated on Falls Prevention: She verbalizes understanding. Refuses Ed or Urgent Care Evaluation.  Chronic Pain Syndrome: Refilled: Hydrocodone  10/325 mg one tablet every 6 hours as needed or pain #120. Second script sent or the following month. We will continue the opioid monitoring program, this consists of regular clinic visits, examinations, urine drug screen, pill counts as well as use of [place]  Controlled Substance Reporting system. A 12 month History has been reviewed on the [place]  Controlled Substance Reporting System  on 09/03/2023  F/U in 2 months

## 2023-09-07 LAB — DRUG TOX MONITOR 1 W/CONF, ORAL FLD
Amphetamines: NEGATIVE ng/mL (ref <10)
Barbiturates: NEGATIVE ng/mL (ref <10)
Benzodiazepines: NEGATIVE ng/mL (ref <0.50)
Buprenorphine: NEGATIVE ng/mL (ref <0.10)
Cocaine: NEGATIVE ng/mL (ref <5.0)
Codeine: NEGATIVE ng/mL (ref <2.5)
Dihydrocodeine: 4.1 ng/mL — ABNORMAL HIGH (ref <2.5)
Fentanyl: NEGATIVE ng/mL (ref <0.10)
Heroin Metabolite: NEGATIVE ng/mL (ref <1.0)
Hydrocodone: 84.7 ng/mL — ABNORMAL HIGH (ref <2.5)
Hydromorphone: NEGATIVE ng/mL (ref <2.5)
MARIJUANA: NEGATIVE ng/mL (ref <2.5)
MDMA: NEGATIVE ng/mL (ref <10)
Meprobamate: NEGATIVE ng/mL (ref <2.5)
Methadone: NEGATIVE ng/mL (ref <5.0)
Morphine: NEGATIVE ng/mL (ref <2.5)
Nicotine Metabolite: NEGATIVE ng/mL (ref <5.0)
Norhydrocodone: 3.3 ng/mL — ABNORMAL HIGH (ref <2.5)
Noroxycodone: NEGATIVE ng/mL (ref <2.5)
Opiates: POSITIVE ng/mL — AB (ref <2.5)
Oxycodone: NEGATIVE ng/mL (ref <2.5)
Oxymorphone: NEGATIVE ng/mL (ref <2.5)
Phencyclidine: NEGATIVE ng/mL (ref <10)
Tapentadol: NEGATIVE ng/mL (ref <5.0)
Tramadol: NEGATIVE ng/mL (ref <5.0)
Zolpidem: NEGATIVE ng/mL (ref <5.0)

## 2023-09-07 LAB — DRUG TOX ALC METAB W/CON, ORAL FLD: Alcohol Metabolite: NEGATIVE ng/mL (ref <25)

## 2023-09-09 DIAGNOSIS — I129 Hypertensive chronic kidney disease with stage 1 through stage 4 chronic kidney disease, or unspecified chronic kidney disease: Secondary | ICD-10-CM | POA: Diagnosis not present

## 2023-09-09 DIAGNOSIS — R7303 Prediabetes: Secondary | ICD-10-CM | POA: Diagnosis not present

## 2023-09-09 DIAGNOSIS — Z0000 Encounter for general adult medical examination without abnormal findings: Secondary | ICD-10-CM | POA: Diagnosis not present

## 2023-09-09 DIAGNOSIS — M25551 Pain in right hip: Secondary | ICD-10-CM | POA: Diagnosis not present

## 2023-09-09 DIAGNOSIS — F325 Major depressive disorder, single episode, in full remission: Secondary | ICD-10-CM | POA: Diagnosis not present

## 2023-09-09 DIAGNOSIS — N1831 Chronic kidney disease, stage 3a: Secondary | ICD-10-CM | POA: Diagnosis not present

## 2023-09-09 DIAGNOSIS — G8929 Other chronic pain: Secondary | ICD-10-CM | POA: Diagnosis not present

## 2023-09-09 DIAGNOSIS — G4733 Obstructive sleep apnea (adult) (pediatric): Secondary | ICD-10-CM | POA: Diagnosis not present

## 2023-09-09 DIAGNOSIS — Z1331 Encounter for screening for depression: Secondary | ICD-10-CM | POA: Diagnosis not present

## 2023-09-09 DIAGNOSIS — C9110 Chronic lymphocytic leukemia of B-cell type not having achieved remission: Secondary | ICD-10-CM | POA: Diagnosis not present

## 2023-09-29 DIAGNOSIS — R1314 Dysphagia, pharyngoesophageal phase: Secondary | ICD-10-CM | POA: Diagnosis not present

## 2023-09-29 DIAGNOSIS — K224 Dyskinesia of esophagus: Secondary | ICD-10-CM | POA: Diagnosis not present

## 2023-09-29 DIAGNOSIS — R079 Chest pain, unspecified: Secondary | ICD-10-CM | POA: Diagnosis not present

## 2023-09-29 DIAGNOSIS — K529 Noninfective gastroenteritis and colitis, unspecified: Secondary | ICD-10-CM | POA: Diagnosis not present

## 2023-09-29 NOTE — Progress Notes (Signed)
 PATIENT PROFILE: Jamie Phillips is a 83 y.o. female who presents for a visit to the GI clinic at [facility]  for a visit to follow up  PROBLEM LIST:  Patient Active Problem List  Diagnosis  . 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 OSA on CPAP  . Pure hypercholesterolemia  . History of Bell's palsy  . GERD without esophagitis  . Generalized OA  . CLL (chronic lymphocytic leukemia) (CMS/HHS-HCC)  . Depression, major, in remission ()  . Prediabetes  . Chronic pulmonary embolism (CMS/HHS-HCC)  . Healthcare maintenance  . Morbid obesity with BMI of 40.0-44.9, adult (CMS/HHS-HCC)  . RLS (restless legs syndrome)  . DDD (degenerative disc disease), lumbar  . Walker as ambulation aid  . Adnexal cyst  . 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  - Mildly impaired liquid esophageal transit. - of note, the upright median IRP is elevated (unclear clinical significance)    Barium swallow 10/20- moderate esophageal dysmotility with corkscrew app[earance and spasm EGD 10/20- abnormal esophageal motility suspicious for spasm, gastritis- compatible with GAVE. There was no H pylori, Barretts, dysplasia/malignancy/celiac sprue Colonoscopy 2018 with a hyperplastic polyp found    MBSS- 09/26/19- IMPRESSION:  Unremarkable modified barium swallow. Per ST note,  is presenting with functional oropharyngeal swallowing. Oral control of the bolus including oral hold, rotary mastication, and anterior to posterior transfer is within functional limits. Timing of pharyngeal swallow initiation is within functional limits. Aspects of the pharyngeal stage of swallowing including tongue base retraction, hyolaryngeal excursion, epiglottic inversion, and duration/amplitude of UES opening are within normal limits. There is no observed  pharyngeal residue, laryngeal penetration, or tracheal aspiration. The patient is not at risk for prandial aspiration. The patient was assured that her swallowing is safe, from an aspiration perspective.  INTERVAL HISTORY:  Jamie Phillips was last seen on .  Jackhammer esophagus/gerd-  continue pantoprazole  bid. Swallowing mods discsussed- she should continue these for now. 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 Consider epi testing v other if no better at follow up Follow up in about 8w sooner if problems  Note she was on beta-blocker and ssri at that visit. Long acting nitrate was discussed with her PCP however he was concerned about side effects from this given her age/frailty/polypharmacy. EGD was also discussed last year but patient declined Since the last visit, called back to clinic as xifaxan cost prohibitive so metronidazole was sent in- states she took the metoclopramide improved her diarrhea for the time she was on the antibiotics and the following week- was having formed stools and felt like the cholestyramine  was doing a good job- but then the diarrhea restarted. Denies any rectal bleeding/abdominal pain.  Does report an increase in dysphagia since I have seen her last- both with solids and liquids. Note she reports she is now taking Hydrocodone  1-2 times/d and Robaxin  bid for her chronic back/spine pain. She continues on bid ppi.  Denies any abdominal pain/new or different symptoms. We reviewed her symptoms, history and plan of care. We discussed the jackhammer esophagus/esophageal dysmotilities. Note the altoids did not help.she is on ssri and gabapentin . Reports also some intermittent chest pain at rest lower leg edema- the lower extremity edema is concerning to her. She continues to follow with  Dr Amey for her CLL/mgus and continues on eliquis  for her DVT  history (2017). Follow with [facility]. Recent cbc with wbc 15.2, hgb 11.9, normal platelets, A1c 5.9. CMP with normal liver enzymes, gfr 50, na 147  Medications:  Outpatient Encounter Medications as of 09/29/2023  Medication Sig Dispense Refill  . apixaban  (ELIQUIS ) 5 mg tablet TAKE 1 TABLET (5 MG TOTAL) BY MOUTH 2 (TWO) TIMES DAILY 180 tablet 1  . b complex vitamins capsule Take 1 capsule by mouth once daily    . calcium  carbonate 600 mg calcium  (1,500 mg) Tab tablet Take 1 tablet by mouth 2 (two) times daily    . cholecalciferol (VITAMIN D3) 5,000 unit capsule Take 1 capsule by mouth daily with breakfast    . cholestyramine  (QUESTRAN ) 4 gram oral powder packet MIX 1 PACKET IN LIQUID AND DRINK THREE TIMES DAILY WITH MEALS 270 packet 10  . DULoxetine  (CYMBALTA ) 60 MG DR capsule Take 1 capsule (60 mg total) by mouth once daily 90 capsule 3  . escitalopram  oxalate (LEXAPRO ) 20 MG tablet Take 1 tablet (20 mg total) by mouth once daily 90 tablet 3  . gabapentin  (NEURONTIN ) 400 MG capsule Take 1 capsule (400 mg total) by mouth 3 (three) times daily 270 capsule 3  . HYDROcodone -acetaminophen  (NORCO) 10-325 mg tablet Take 1 tablet by mouth every 6 (six) hours as needed    . loperamide (IMODIUM) 2 mg capsule Take by mouth    . losartan  (COZAAR ) 100 MG tablet Take 1 tablet (100 mg total) by mouth once daily 90 tablet 3  . magnesium  gluconate (MAGONATE) 27.5 mg magne- sium (500 mg) tablet Take 500 mg by mouth 2 (two) times daily    . methocarbamoL  (ROBAXIN ) 500 MG tablet Take 500 mg by mouth once daily    . pantoprazole  (PROTONIX ) 40 MG DR tablet TAKE 1 TABLET TWICE DAILY BEFORE MEALS 180 tablet 3  . pravastatin  (PRAVACHOL ) 40 MG tablet Take 1 tablet (40 mg total) by mouth once daily 90 tablet 10  . propranoloL  (INDERAL ) 20 MG tablet TAKE 1 TABLET TWICE DAILY 180 tablet 3  . sucralfate  (CARAFATE ) 1 gram tablet TAKE 1 TABLET  TWO TIMES DAILY BEFORE MEALS 180 tablet 1  . TORsemide  (DEMADEX) 20 MG tablet Take 1 tablet (20 mg total) by mouth once daily 90 tablet 3  . TURMERIC ORAL Take 1 capsule by mouth 2 (two) times daily    . vitamins  A,C,E-zinc-copper [id number] unit-mg-unit Take 1 capsule by mouth 2 (two) times daily       . [EXPIRED] rifAXIMin (XIFAXAN) 550 mg tablet Take 1 tablet (550 mg total) by mouth 3 (three) times daily for 14 days 42 tablet 0  . [DISCONTINUED] escitalopram  oxalate (LEXAPRO ) 20 MG tablet TAKE 1 TABLET EVERY DAY 90 tablet 3  . [DISCONTINUED] FUROsemide  (LASIX ) 20 MG tablet Take 1 tablet (20 mg total) by mouth once daily as needed for Edema 90 tablet 3  . [DISCONTINUED] HYDROcodone -acetaminophen  (NORCO) 5-325 mg tablet Take 1 tablet by mouth every 6 (six) hours as needed (Patient not taking: Reported on 06/23/2023)    . [DISCONTINUED] lidocaine  (LIDODERM ) 5 % patch Place 1 patch onto the skin Place 1 patch onto the skin daily. 12 hors on; 12 hrs off- 8am to 8pm- apply to R low back. (Patient not taking: Reported on 06/23/2023)    . [DISCONTINUED] propranoloL  (INDERAL ) 20 MG tablet Take 1 tablet (20 mg total) by mouth 2 (two) times daily  180 tablet 3   No facility-administered encounter medications on file as of 09/29/2023.     ALLERGIES: Morphine  and Oxycodone    PMHx:  Past Medical History:  Diagnosis Date  . Anxiety   . Atypical chest pain   . Cervical disc disease   . Chronic kidney disease   . Chronic low back pain   . Chronic pulmonary embolism (CMS/HHS-HCC) 08/25/2015   4-17, eliquis  started: filter to come out after 9-18 knee surgery  . CLL (chronic lymphocytic leukemia) (CMS/HHS-HCC) 01/23/2014  . Depression   . Depression, major, in remission () 02/06/2014  . Fibroid   . GERD without esophagitis 01/23/2014  . HA (headache)   . History of Bell's palsy 01/23/2014  . Hyperglycemia 10/13/2014  . Hyperlipidemia   . Hypertension   . Lymphocytosis    CLL versus myeloproliferative disorder  . Macular degeneration   . Morbid obesity  with BMI of 45.0-49.9, adult (CMS/HHS-HCC) 02/10/2014  . Obesity   . OSA on CPAP 01/23/2014  . Osteoarthritis   . Ovarian cyst   . Peripheral vascular disease of lower extremity ()   . Pneumonia   . Pulmonary embolism (CMS/HHS-HCC) 08/14/2015  . Sleep apnea   . Status post left knee replacement 06/21/2016  . Status post total right knee replacement using cement 01/30/2017     PSHx: Past Surgical History:  Procedure Laterality Date  . COLONOSCOPY  08/28/2006   Hyperplastic colon polyp  . pulmonary angiogram with lysis  08/15/2015  . JOINT REPLACEMENT Left 06/17/2016   mako total knee  . JOINT REPLACEMENT Right 01/13/2017   [facility]KO Dr. [facility]  . COLONOSCOPY  03/10/2017   HP Repeat 55yrs if desired by patient MUS   . LAPAROSCOPIC APPENDECTOMY  10/10/2018   Dr Lucas Catchings   . colon surgery Right 10/10/2018   Dr. Lucas Catchings   . EGD  02/02/2019   GAVE/Diffuse esophageal spasm/Repeat 63yrs/TKT  . COLON SURGERY    . DILATION AND CURETTAGE, DIAGNOSTIC / THERAPEUTIC    . Open reduction internal fixation of the left wrist with pin placement       FAMILY HISTORY: Family History  Problem Relation Name Age of Onset  . Stroke Mother    . Heart failure Mother    . Coronary Artery Disease (Blocked arteries around heart) Father    . Pulmonary embolism Father    . Thyroid  cancer Brother       Social History: Social History   Socioeconomic History  . Marital status: Widowed  . Number of children: 4  . Years of education: 12  Occupational History  . Occupation: Retired  Tobacco Use  . Smoking status: Never  . Smokeless tobacco: Never  Vaping Use  . Vaping status: Never Used  Substance and Sexual Activity  . Alcohol use: No  . Drug use: No  . Sexual activity: Not Currently    Partners: Male    Birth control/protection: Post-menopausal   Social Drivers of Health   Financial Resource Strain: Low Risk  (01/13/2023)   Overall Financial Resource Strain (CARDIA)   .  Difficulty of Paying Living Expenses: Not hard at all  Food Insecurity: No Food Insecurity (01/13/2023)   Hunger Vital Sign   . Worried About Programme researcher, broadcasting/film/video in the Last Year: Never true   . Ran Out of Food in the Last Year: Never true  Transportation Needs: No Transportation Needs (01/13/2023)   PRAPARE - Transportation   . Lack of Transportation (Medical): No   .  Lack of Transportation (Non-Medical): No     REVIEW OF SYSTEMS:   10 systems reviewed, unremarkable other than what is written above with exception of fatigue.    PHYSICAL EXAM:  Vitals:   09/29/23 0921  BP: 116/79  Pulse: 74  Temp: 36.3 C (97.4 F)   Body mass index is 40.45 kg/m. Weight: (!) 110.1 kg (242 lb 12.8 oz)   General Appearance:    Alert, cooperative, no distress, appears stated age Head:     Atraumatic, normocephalic Eyes:   Anciteric, conjunctiva pink. Mouth: no oral ulcers Lungs:     Respirations eupneic.  Clear to auscultation bilaterally  Heart:    Regular rate and rhythm, S1 and S2 normal, no murmur, rub   or gallop Abdomen:     Flat, bowel sounds x 4.Soft, non-tender/nondistended. No guarding, rigidity, rebound tenderness or other peritoneal signs Extremities:   No cyanosis or edema, moves all extremities well. Strength 5/5. Skin:   Skin color, texture, turgor normal, no rashes or lesions Neuro: alert, oriented x 3, cranial nerves II-XII intact Psych: pleasant, calm, cooperative, Logical thought and good insight.    REVIEW OF DATA: I have reviewed the following data today:  Prior notes labs endoscopy reports    ASSESSMENT AND PLAN:  SIBO/IBSD/cholorectic component- retreat sibo for now.  Continue cholestyramine  Jackhammer esophagus/dysphagia- continue ppi & swallowing mods. Her medications could contribute (ie narcotics/muscle relaxers) Schedule barium swallow and tentative egd with cardiac clearance.  Procedure information given: indications, benefits, risks- including, but not  limited to bleeding, infection, perforation, difficulty with sedation, were discussed with the patient, and they are agreeable to the procedure  Intermittent chest pain/lower extremity edema- cardiology referral. It is possible that her jackhammer esophagus contributes to the pain but not the lower extremity edema Follow up as needed after procedure, sooner if problems

## 2023-10-02 DIAGNOSIS — M4310 Spondylolisthesis, site unspecified: Secondary | ICD-10-CM | POA: Diagnosis not present

## 2023-10-02 DIAGNOSIS — M47816 Spondylosis without myelopathy or radiculopathy, lumbar region: Secondary | ICD-10-CM | POA: Diagnosis not present

## 2023-10-10 DIAGNOSIS — H353221 Exudative age-related macular degeneration, left eye, with active choroidal neovascularization: Secondary | ICD-10-CM | POA: Diagnosis not present

## 2023-10-14 ENCOUNTER — Other Ambulatory Visit: Payer: Self-pay | Admitting: Gastroenterology

## 2023-10-14 DIAGNOSIS — K224 Dyskinesia of esophagus: Secondary | ICD-10-CM

## 2023-10-14 DIAGNOSIS — R1314 Dysphagia, pharyngoesophageal phase: Secondary | ICD-10-CM

## 2023-10-16 DIAGNOSIS — M5126 Other intervertebral disc displacement, lumbar region: Secondary | ICD-10-CM | POA: Diagnosis not present

## 2023-10-16 DIAGNOSIS — M4310 Spondylolisthesis, site unspecified: Secondary | ICD-10-CM | POA: Diagnosis not present

## 2023-10-16 DIAGNOSIS — M47817 Spondylosis without myelopathy or radiculopathy, lumbosacral region: Secondary | ICD-10-CM | POA: Diagnosis not present

## 2023-10-16 DIAGNOSIS — M47816 Spondylosis without myelopathy or radiculopathy, lumbar region: Secondary | ICD-10-CM | POA: Diagnosis not present

## 2023-10-16 DIAGNOSIS — M5135 Other intervertebral disc degeneration, thoracolumbar region: Secondary | ICD-10-CM | POA: Diagnosis not present

## 2023-10-21 ENCOUNTER — Ambulatory Visit
Admission: RE | Admit: 2023-10-21 | Discharge: 2023-10-21 | Disposition: A | Discharge status: Home / Self Care | Source: Ambulatory Visit | Attending: Gastroenterology | Admitting: Gastroenterology

## 2023-10-21 DIAGNOSIS — K224 Dyskinesia of esophagus: Secondary | ICD-10-CM | POA: Diagnosis not present

## 2023-10-21 DIAGNOSIS — R1310 Dysphagia, unspecified: Secondary | ICD-10-CM | POA: Diagnosis not present

## 2023-10-21 DIAGNOSIS — R1314 Dysphagia, pharyngoesophageal phase: Secondary | ICD-10-CM | POA: Insufficient documentation

## 2023-10-23 ENCOUNTER — Encounter: Payer: Self-pay | Admitting: *Deleted

## 2023-10-31 ENCOUNTER — Other Ambulatory Visit: Payer: Self-pay | Admitting: Gastroenterology

## 2023-11-07 ENCOUNTER — Encounter: Admitting: Physical Medicine and Rehabilitation

## 2023-11-12 DIAGNOSIS — M47816 Spondylosis without myelopathy or radiculopathy, lumbar region: Secondary | ICD-10-CM | POA: Diagnosis not present

## 2023-11-12 DIAGNOSIS — M4310 Spondylolisthesis, site unspecified: Secondary | ICD-10-CM | POA: Diagnosis not present

## 2023-11-12 DIAGNOSIS — M532X6 Spinal instabilities, lumbar region: Secondary | ICD-10-CM | POA: Diagnosis not present

## 2023-11-17 ENCOUNTER — Telehealth: Payer: Self-pay | Admitting: *Deleted

## 2023-11-17 NOTE — Telephone Encounter (Signed)
 Error, I put in the number but it was off 1 digit . I sent her a message that it was error on her voicemail

## 2023-11-18 ENCOUNTER — Inpatient Hospital Stay: Payer: Medicare PPO | Admitting: Internal Medicine

## 2023-11-18 ENCOUNTER — Encounter: Payer: Self-pay | Admitting: Internal Medicine

## 2023-11-18 ENCOUNTER — Inpatient Hospital Stay: Payer: Medicare PPO | Attending: Internal Medicine

## 2023-11-18 VITALS — BP 103/63 | HR 59 | Temp 98.6°F | Resp 17 | Ht 64.0 in | Wt 243.0 lb

## 2023-11-18 DIAGNOSIS — Z86711 Personal history of pulmonary embolism: Secondary | ICD-10-CM | POA: Insufficient documentation

## 2023-11-18 DIAGNOSIS — C9110 Chronic lymphocytic leukemia of B-cell type not having achieved remission: Secondary | ICD-10-CM | POA: Diagnosis not present

## 2023-11-18 DIAGNOSIS — Z808 Family history of malignant neoplasm of other organs or systems: Secondary | ICD-10-CM | POA: Insufficient documentation

## 2023-11-18 DIAGNOSIS — D472 Monoclonal gammopathy: Secondary | ICD-10-CM | POA: Diagnosis not present

## 2023-11-18 DIAGNOSIS — Z7901 Long term (current) use of anticoagulants: Secondary | ICD-10-CM | POA: Insufficient documentation

## 2023-11-18 DIAGNOSIS — G4733 Obstructive sleep apnea (adult) (pediatric): Secondary | ICD-10-CM | POA: Insufficient documentation

## 2023-11-18 DIAGNOSIS — R296 Repeated falls: Secondary | ICD-10-CM | POA: Diagnosis not present

## 2023-11-18 LAB — CMP (CANCER CENTER ONLY)
ALT: 11 U/L (ref 0–44)
AST: 14 U/L — ABNORMAL LOW (ref 15–41)
Albumin: 3.5 g/dL (ref 3.5–5.0)
Alkaline Phosphatase: 86 U/L (ref 38–126)
Anion gap: 7 (ref 5–15)
BUN: 28 mg/dL — ABNORMAL HIGH (ref 8–23)
CO2: 31 mmol/L (ref 22–32)
Calcium: 9 mg/dL (ref 8.9–10.3)
Chloride: 100 mmol/L (ref 98–111)
Creatinine: 1.12 mg/dL — ABNORMAL HIGH (ref 0.44–1.00)
GFR, Estimated: 49 mL/min — ABNORMAL LOW (ref >60)
Glucose, Bld: 77 mg/dL (ref 70–99)
Potassium: 4.1 mmol/L (ref 3.5–5.1)
Sodium: 138 mmol/L (ref 135–145)
Total Bilirubin: 0.6 mg/dL (ref 0.0–1.2)
Total Protein: 6 g/dL — ABNORMAL LOW (ref 6.5–8.1)

## 2023-11-18 LAB — CBC WITH DIFFERENTIAL (CANCER CENTER ONLY)
Abs Immature Granulocytes: 0.03 K/uL (ref 0.00–0.07)
Basophils Absolute: 0.1 K/uL (ref 0.0–0.1)
Basophils Relative: 1 %
Eosinophils Absolute: 0.3 K/uL (ref 0.0–0.5)
Eosinophils Relative: 2 %
HCT: 38.8 % (ref 36.0–46.0)
Hemoglobin: 12.2 g/dL (ref 12.0–15.0)
Immature Granulocytes: 0 %
Lymphocytes Relative: 51 %
Lymphs Abs: 6.3 K/uL — ABNORMAL HIGH (ref 0.7–4.0)
MCH: 30.4 pg (ref 26.0–34.0)
MCHC: 31.4 g/dL (ref 30.0–36.0)
MCV: 96.8 fL (ref 80.0–100.0)
Monocytes Absolute: 0.8 K/uL (ref 0.1–1.0)
Monocytes Relative: 7 %
Neutro Abs: 4.8 K/uL (ref 1.7–7.7)
Neutrophils Relative %: 39 %
Platelet Count: 217 K/uL (ref 150–400)
RBC: 4.01 MIL/uL (ref 3.87–5.11)
RDW: 14.9 % (ref 11.5–15.5)
WBC Count: 12.2 K/uL — ABNORMAL HIGH (ref 4.0–10.5)
nRBC: 0 % (ref 0.0–0.2)

## 2023-11-18 LAB — LACTATE DEHYDROGENASE: LDH: 153 U/L (ref 98–192)

## 2023-11-18 NOTE — Progress Notes (Signed)
 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 S/p IV Filter explantation.    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  2) CD56 expression on monocytes.   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  --------------------------------------------------------  DIAGNOSIS: CLL  STAGE: I        ;GOALS: COntrol  CURRENT/MOST RECENT THERAPY: surveillaince    CLL (chronic lymphocytic leukemia) (HCC)     INTERVAL HISTORY: Alone.  Ambulating with a rolling walker.   A pleasant 83 year-old female patient with above history of History of CLL/SLL- on surveillance; and history of bilateral PE- on Eliquis  is here for follow-up.  Patient continues to have chronic joint pains back pain.  Not any worse.  She denies any blood in stools or black or stools.    No night sweats.  No fevers.  No new lumps or bumps.  Chronic fatigue.  Patient had a recent fall-mechanical.  Denies any trauma.  Review of Systems  Constitutional:  Positive for malaise/fatigue. Negative for chills, diaphoresis, fever and weight loss.  HENT:  Negative for nosebleeds and  sore throat.   Eyes:  Negative for double vision.  Respiratory:  Negative for cough, hemoptysis, sputum production, shortness of breath and wheezing.   Cardiovascular:  Negative for chest pain, palpitations, orthopnea and leg swelling.  Gastrointestinal:  Negative for abdominal pain, blood in stool, constipation, diarrhea, heartburn, melena, nausea and vomiting.  Genitourinary:  Negative for dysuria, frequency and urgency.  Musculoskeletal:  Positive for back pain, joint pain and neck pain.  Skin: Negative.  Negative for itching and rash.  Neurological:  Negative for dizziness, tingling, focal weakness, weakness and headaches.  Endo/Heme/Allergies:  Does not bruise/bleed easily.  Psychiatric/Behavioral:  Negative for depression. The patient is not nervous/anxious and does not have insomnia.     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)    Macular degeneration    MGUS (monoclonal gammopathy of unknown significance)    Morbid obesity (HCC)    Obesity    Pre-diabetes    Pulmonary embolism (HCC)    PVD (peripheral vascular disease) (HCC)  RLS (restless legs syndrome)    Sleep apnea    CPAP    PAST SURGICAL HISTORY :   Past Surgical History:  Procedure Laterality Date   APPENDECTOMY     BREAST BIOPSY Left 08/19/2004   lt bx/clip-neg   BREAST BIOPSY Left 03/05/2023   US  BX heart clip, path pending-   BREAST BIOPSY Left 03/05/2023   US  BX hydromark coil clip axilla node- path  pending   BREAST BIOPSY Left 03/05/2023   US  LT BREAST BX W LOC DEV 1ST LESION IMG BX SPEC US  GUIDE 03/05/2023 [facility]   CATARACT EXTRACTION W/PHACO Left 09/10/2022   Procedure: CATARACT EXTRACTION PHACO AND INTRAOCULAR LENS PLACEMENT (IOC) LEFT  5.88  00:31.9;  Surgeon: Jaye Fallow, MD;  Location: [facility];  Service: Ophthalmology;  Laterality: Left;  sleep apnea   COLONOSCOPY     COLONOSCOPY WITH PROPOFOL  N/A 03/10/2017   Procedure: COLONOSCOPY WITH PROPOFOL ;  Surgeon: Gaylyn Gladis PENNER, MD;  Location: [facility] ENDOSCOPY;  Service: Endoscopy;  Laterality: N/A;   DILATION AND CURETTAGE OF UTERUS     ESOPHAGOGASTRODUODENOSCOPY (EGD) WITH PROPOFOL  N/A 02/02/2019   Procedure: ESOPHAGOGASTRODUODENOSCOPY (EGD) WITH PROPOFOL ;  Surgeon: Toledo, Ladell POUR, MD;  Location: [facility] ENDOSCOPY;  Service: Endoscopy;  Laterality: N/A;   EYE SURGERY     IVC FILTER INSERTION N/A 06/11/2016   Procedure: IVC Filter Insertion;  Surgeon: Cordella KANDICE Shawl, MD;  Location: [facility] INVASIVE CV LAB;  Service: Cardiovascular;  Laterality: N/A;   IVC FILTER REMOVAL N/A 05/06/2017   Procedure: IVC FILTER REMOVAL;  Surgeon: Shawl Cordella KANDICE, MD;  Location: [facility] INVASIVE CV LAB;  Service: Cardiovascular;  Laterality: N/A;   JOINT REPLACEMENT Left 06/17/2016   Knees, pt stated left and right have been replaced.   LAPAROSCOPIC APPENDECTOMY N/A 10/10/2018   Procedure: APPENDECTOMY LAPAROSCOPIC ATTEMPTED CONVERTED TO OPEN;  Surgeon: Rodolph Romano, MD;  Location: [facility] ORS;  Service: General;  Laterality: N/A;   LAPAROSCOPY N/A 10/10/2018   Procedure: LAPAROSCOPY DIAGNOSTIC ATTEMPTED CONVERTED TO OPEN;  Surgeon: Rodolph Romano, MD;  Location: [facility] ORS;  Service: General;  Laterality: N/A;   LUMBAR LAMINECTOMY/DECOMPRESSION MICRODISCECTOMY N/A 07/09/2021   Procedure: Laminectomy and Foraminotomy - Lumbar Two-Lumbar Three - Lumbar Three-Lumbar Four - Lumbar Four-Lumbar Five;  Surgeon: Louis Shove, MD;  Location: [facility] OR;  Service: Neurosurgery;  Laterality: N/A;   neck fusion     OPEN REDUCTION INTERNAL FIXATION (ORIF) WRIST WITH ILIAC CREST BONE GRAFT Left    PARTIAL COLECTOMY Right 10/10/2018   Procedure: PARTIAL COLECTOMY;  Surgeon: Rodolph Romano, MD;  Location: [facility] ORS;  Service: General;  Laterality: Right;   PERIPHERAL VASCULAR CATHETERIZATION N/A 08/15/2015   Procedure: pulmonary angiogram with lysis;  Surgeon: Cordella KANDICE Shawl, MD;  [facility] INV[facility] CV LAB;  Service: Cardiovascular;  Laterality: N/A;   WRIST SURGERY Left 2000    FAMILY HISTORY :   Family History  Problem Relation Age of Onset   Pulmonary embolism Mother    Sudden death Mother    Obesity Mother    Heart failure Father    Congestive Heart Failure Father    Sudden death Father    Thyroid  cancer Brother    Breast cancer Neg Hx     SOCIAL HISTORY:   Social History   Tobacco Use   Smoking status: Never   Smokeless tobacco: Never  Vaping Use   Vaping status: Never Used  Substance Use Topics   Alcohol use: No    Alcohol/week: 0.0 standard drinks of  alcohol   Drug use: No    ALLERGIES:  has no known allergies.  MEDICATIONS:  Current Outpatient Medications  Medication Sig Dispense Refill   acetaminophen  (TYLENOL ) 650 MG CR tablet Take 650-1,300 mg by mouth every 8 (eight) hours as needed for pain. TYLENOL  ARTHRITIS     apixaban  (ELIQUIS ) 5 MG TABS tablet Take 1 tablet (5 mg total) by mouth 2 (two) times daily. 180 tablet 3   b complex vitamins capsule Take 1 capsule by mouth daily.     calcium  carbonate (OSCAL) 1500 (600 Ca) MG TABS tablet Take 1,500 mg by mouth 2 (two) times daily with a meal.     Cholecalciferol (VITAMIN D -3) 125 MCG (5000 UT) TABS Take by mouth daily.     cholestyramine  (QUESTRAN ) 4 g packet Take 1 packet (4 g total) by mouth 3 (three) times daily with meals. 60 each 0   DULoxetine  (CYMBALTA ) 60 MG capsule Take 1 capsule (60 mg total) by mouth daily. With  Other SSRI 90 capsule 3   escitalopram  (LEXAPRO ) 20 MG tablet Take 1 tablet by mouth daily.     gabapentin  (NEURONTIN ) 400 MG capsule Take 1 capsule (400 mg total) by mouth 4 (four) times daily. Take 1 pill in Am and 2 pills at night- for nerve pain 360 capsule 1   HYDROcodone -acetaminophen  (NORCO) 10-325 MG tablet Take 1 tablet by mouth every 6 (six) hours as needed. 120 tablet 0   losartan -hydrochlorothiazide  (HYZAAR) 100-12.5 MG tablet Take 0.5 tablets by mouth daily. Reported on 08/22/2015 30 tablet    magnesium  gluconate (MAGONATE) 500 MG tablet Take 500 mg by mouth 2 (two) times daily.     methocarbamol  (ROBAXIN ) 500 MG tablet TAKE 1 TABLET (500 MG TOTAL) EVERY 8 (EIGHT) HOURS AS NEEDED FOR MUSCLE SPASMS. 180 tablet 5   Multiple Vitamins-Minerals (PRESERVISION AREDS 2 PO) Take 1 tablet by mouth in the morning and at bedtime.     pravastatin  (PRAVACHOL ) 40 MG tablet Take 1 tablet by mouth daily.     propranolol  (INDERAL ) 40 MG tablet Take 40 mg by mouth daily.     sucralfate  (CARAFATE ) 1 g tablet Take 1 g by mouth daily.     TURMERIC CURCUMIN PO Take 1 capsule by mouth in the morning and at bedtime.     No current facility-administered medications for this visit.    PHYSICAL EXAMINATION:   BP 103/63   Pulse (!) 59   Temp 98.6 F (37 C)   Resp 17   Ht 5' 4 (1.626 m)   Wt 243 lb (110.2 kg)   SpO2 94%   BMI 41.71 kg/m   Filed Weights   11/18/23 1021  Weight: 243 lb (110.2 kg)      Physical Exam Constitutional:      Comments: Obese.  Walks by herself.  HENT:     Head: Normocephalic and atraumatic.     Mouth/Throat:     Pharynx: No oropharyngeal exudate.  Eyes:     Pupils: Pupils are equal, round, and reactive to light.  Cardiovascular:     Rate and Rhythm: Normal rate and regular rhythm.  Pulmonary:     Effort: No respiratory distress.     Breath sounds: No wheezing.  Abdominal:     General: Bowel sounds are normal. There is no distension.     Palpations: Abdomen  is soft. There is no mass.     Tenderness: There is no abdominal tenderness. There is no guarding or rebound.  Musculoskeletal:  General: No tenderness. Normal range of motion.     Cervical back: Normal range of motion and neck supple.  Skin:    General: Skin is warm.     Comments: Chronic bruises.  Neurological:     Mental Status: She is alert and oriented to person, place, and time.  Psychiatric:        Mood and Affect: Affect normal.      LABORATORY DATA:  I have reviewed the data as listed    Component Value Date/Time   NA 138 11/18/2023 1012   NA 142 12/26/2020 1112   NA 141 04/29/2012 1822   K 4.1 11/18/2023 1012   K 3.9 04/29/2012 1822   CL 100 11/18/2023 1012   CL 107 04/29/2012 1822   CO2 31 11/18/2023 1012   CO2 28 04/29/2012 1822   GLUCOSE 77 11/18/2023 1012   GLUCOSE 129 (H) 04/29/2012 1822   BUN 28 (H) 11/18/2023 1012   BUN 15 12/26/2020 1112   BUN 21 (H) 04/29/2012 1822   CREATININE 1.12 (H) 11/18/2023 1012   CREATININE 1.02 (H) 07/28/2014 0950   CALCIUM  9.0 11/18/2023 1012   CALCIUM  8.7 04/29/2012 1822   PROT 6.0 (L) 11/18/2023 1012   PROT 5.8 (L) 12/26/2020 1112   PROT 7.1 04/29/2012 1822   ALBUMIN  3.5 11/18/2023 1012   ALBUMIN  4.0 12/26/2020 1112   ALBUMIN  3.3 (L) 04/29/2012 1822   AST 14 (L) 11/18/2023 1012   ALT 11 11/18/2023 1012   ALT 33 04/29/2012 1822   ALKPHOS 86 11/18/2023 1012   ALKPHOS 169 (H) 04/29/2012 1822   BILITOT 0.6 11/18/2023 1012   GFRNONAA 49 (L) 11/18/2023 1012   GFRNONAA 55 (L) 07/28/2014 0950   GFRAA 55 (L) 09/03/2019 0830   GFRAA >60 07/28/2014 0950    No results found for: SPEP, UPEP  Lab Results  Component Value Date   WBC 12.2 (H) 11/18/2023   NEUTROABS 4.8 11/18/2023   HGB 12.2 11/18/2023   HCT 38.8 11/18/2023   MCV 96.8 11/18/2023   PLT 217 11/18/2023      Chemistry      Component Value Date/Time   NA 138 11/18/2023 1012   NA 142 12/26/2020 1112   NA 141 04/29/2012 1822   K 4.1 11/18/2023  1012   K 3.9 04/29/2012 1822   CL 100 11/18/2023 1012   CL 107 04/29/2012 1822   CO2 31 11/18/2023 1012   CO2 28 04/29/2012 1822   BUN 28 (H) 11/18/2023 1012   BUN 15 12/26/2020 1112   BUN 21 (H) 04/29/2012 1822   CREATININE 1.12 (H) 11/18/2023 1012   CREATININE 1.02 (H) 07/28/2014 0950      Component Value Date/Time   CALCIUM  9.0 11/18/2023 1012   CALCIUM  8.7 04/29/2012 1822   ALKPHOS 86 11/18/2023 1012   ALKPHOS 169 (H) 04/29/2012 1822   AST 14 (L) 11/18/2023 1012   ALT 11 11/18/2023 1012   ALT 33 04/29/2012 1822   BILITOT 0.6 11/18/2023 1012        ASSESSMENT & PLAN:   CLL (chronic lymphocytic leukemia) (HCC) # APRIL 2017- Massive bilateral Pulmonary Embolus-on indefinite anticoagulation.  Continue Eliquis - Refill Eliquis  with Dr.Anderson.  No concerns for bleeding. See below re: falls- consider decreasing dose of eliquis  next visit- stable.    # Chronic low-grade B-cell lymphoproliferative disorder/CLL-hemoglobin platelets normal.  Platelets slightly elevated 14 absolute lymphocyte count 4-5,000.  Asymptomatic/continue surveillance- Mammogram/Left breast/ left AX LN:    -SMALL LYMPHOCYTIC LYMPHOMA.  Discussed  with the patient CLL/SLL are treated similarly-and at this time recommend continued surveillance.  Will order CT scan in 6 months/ordered today.   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 JULY 2025- 0.3gm/dl; K/l=7- stable- monitor for now-   # Fatigue-history of obstructive sleep apnea/continue CPAP.stable.  # Falls- ? Sec to gabapentin -recommend speaking to PCP re: other options.     # DISPOSITION: # follow up in 6 months-  MD labs-[ cbc/cmp/ldh/MM panel; K-light chains]; CT CAP prior-Dr.B     Cindy JONELLE Joe, MD 11/18/2023 11:34 AM

## 2023-11-18 NOTE — Progress Notes (Signed)
 Patient has no concerns

## 2023-11-18 NOTE — Assessment & Plan Note (Addendum)
#   APRIL 2017- Massive bilateral Pulmonary Embolus-on indefinite anticoagulation.  Continue Eliquis - Refill Eliquis  with Dr.Anderson.  No concerns for bleeding. See below re: falls- consider decreasing dose of eliquis  next visit- stable.    # Chronic low-grade B-cell lymphoproliferative disorder/CLL-hemoglobin platelets normal.  Platelets slightly elevated 14 absolute lymphocyte count 4-5,000.  Asymptomatic/continue surveillance- Mammogram/Left breast/ left AX LN:    -SMALL LYMPHOCYTIC LYMPHOMA.  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  Will order CT scan in 6 months/ordered today.   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 JULY 2025- 0.3gm/dl; K/l=7- stable- monitor for now-   # Fatigue-history of obstructive sleep apnea/continue CPAP.stable.  # Falls- ? Sec to gabapentin -recommend speaking to PCP re: other options.     # DISPOSITION: # follow up in 6 months-  MD labs-[ cbc/cmp/ldh/MM panel; K-light chains]; CT CAP prior-Dr.B

## 2023-11-19 LAB — KAPPA/LAMBDA LIGHT CHAINS
Kappa free light chain: 49.9 mg/L — ABNORMAL HIGH (ref 3.3–19.4)
Kappa, lambda light chain ratio: 7.34 — ABNORMAL HIGH (ref 0.26–1.65)
Lambda free light chains: 6.8 mg/L (ref 5.7–26.3)

## 2023-11-20 ENCOUNTER — Other Ambulatory Visit: Payer: Self-pay

## 2023-11-20 ENCOUNTER — Emergency Department

## 2023-11-20 ENCOUNTER — Inpatient Hospital Stay

## 2023-11-20 ENCOUNTER — Inpatient Hospital Stay: Admitting: Radiology

## 2023-11-20 ENCOUNTER — Inpatient Hospital Stay
Admission: EM | Admit: 2023-11-20 | Discharge: 2023-11-28 | DRG: 357 | Disposition: A | Discharge status: Home Health | Attending: Family Medicine | Admitting: Family Medicine

## 2023-11-20 DIAGNOSIS — I4581 Long QT syndrome: Secondary | ICD-10-CM | POA: Diagnosis not present

## 2023-11-20 DIAGNOSIS — M79604 Pain in right leg: Secondary | ICD-10-CM | POA: Diagnosis not present

## 2023-11-20 DIAGNOSIS — I129 Hypertensive chronic kidney disease with stage 1 through stage 4 chronic kidney disease, or unspecified chronic kidney disease: Secondary | ICD-10-CM | POA: Diagnosis present

## 2023-11-20 DIAGNOSIS — I739 Peripheral vascular disease, unspecified: Secondary | ICD-10-CM | POA: Diagnosis present

## 2023-11-20 DIAGNOSIS — R600 Localized edema: Secondary | ICD-10-CM | POA: Diagnosis not present

## 2023-11-20 DIAGNOSIS — N1831 Chronic kidney disease, stage 3a: Secondary | ICD-10-CM | POA: Diagnosis not present

## 2023-11-20 DIAGNOSIS — R7303 Prediabetes: Secondary | ICD-10-CM | POA: Diagnosis present

## 2023-11-20 DIAGNOSIS — R1084 Generalized abdominal pain: Secondary | ICD-10-CM | POA: Diagnosis not present

## 2023-11-20 DIAGNOSIS — Z7901 Long term (current) use of anticoagulants: Secondary | ICD-10-CM

## 2023-11-20 DIAGNOSIS — Z86711 Personal history of pulmonary embolism: Secondary | ICD-10-CM

## 2023-11-20 DIAGNOSIS — E66813 Obesity, class 3: Secondary | ICD-10-CM | POA: Diagnosis present

## 2023-11-20 DIAGNOSIS — Z8249 Family history of ischemic heart disease and other diseases of the circulatory system: Secondary | ICD-10-CM

## 2023-11-20 DIAGNOSIS — Z86718 Personal history of other venous thrombosis and embolism: Secondary | ICD-10-CM

## 2023-11-20 DIAGNOSIS — R296 Repeated falls: Secondary | ICD-10-CM | POA: Diagnosis present

## 2023-11-20 DIAGNOSIS — D259 Leiomyoma of uterus, unspecified: Secondary | ICD-10-CM | POA: Diagnosis not present

## 2023-11-20 DIAGNOSIS — I959 Hypotension, unspecified: Secondary | ICD-10-CM | POA: Diagnosis present

## 2023-11-20 DIAGNOSIS — Z9049 Acquired absence of other specified parts of digestive tract: Secondary | ICD-10-CM

## 2023-11-20 DIAGNOSIS — K922 Gastrointestinal hemorrhage, unspecified: Principal | ICD-10-CM | POA: Diagnosis present

## 2023-11-20 DIAGNOSIS — C9110 Chronic lymphocytic leukemia of B-cell type not having achieved remission: Secondary | ICD-10-CM | POA: Diagnosis present

## 2023-11-20 DIAGNOSIS — E7800 Pure hypercholesterolemia, unspecified: Secondary | ICD-10-CM | POA: Diagnosis present

## 2023-11-20 DIAGNOSIS — K529 Noninfective gastroenteritis and colitis, unspecified: Secondary | ICD-10-CM | POA: Diagnosis present

## 2023-11-20 DIAGNOSIS — M47816 Spondylosis without myelopathy or radiculopathy, lumbar region: Secondary | ICD-10-CM | POA: Diagnosis present

## 2023-11-20 DIAGNOSIS — Z808 Family history of malignant neoplasm of other organs or systems: Secondary | ICD-10-CM

## 2023-11-20 DIAGNOSIS — D6869 Other thrombophilia: Secondary | ICD-10-CM | POA: Diagnosis present

## 2023-11-20 DIAGNOSIS — Z6841 Body Mass Index (BMI) 40.0 and over, adult: Secondary | ICD-10-CM | POA: Diagnosis not present

## 2023-11-20 DIAGNOSIS — Z981 Arthrodesis status: Secondary | ICD-10-CM

## 2023-11-20 DIAGNOSIS — K222 Esophageal obstruction: Secondary | ICD-10-CM | POA: Diagnosis not present

## 2023-11-20 DIAGNOSIS — E8809 Other disorders of plasma-protein metabolism, not elsewhere classified: Secondary | ICD-10-CM | POA: Diagnosis not present

## 2023-11-20 DIAGNOSIS — R9431 Abnormal electrocardiogram [ECG] [EKG]: Secondary | ICD-10-CM | POA: Diagnosis present

## 2023-11-20 DIAGNOSIS — G2581 Restless legs syndrome: Secondary | ICD-10-CM | POA: Diagnosis present

## 2023-11-20 DIAGNOSIS — F32A Depression, unspecified: Secondary | ICD-10-CM | POA: Diagnosis present

## 2023-11-20 DIAGNOSIS — R1031 Right lower quadrant pain: Secondary | ICD-10-CM | POA: Diagnosis not present

## 2023-11-20 DIAGNOSIS — D62 Acute posthemorrhagic anemia: Secondary | ICD-10-CM | POA: Diagnosis present

## 2023-11-20 DIAGNOSIS — N179 Acute kidney failure, unspecified: Secondary | ICD-10-CM | POA: Diagnosis not present

## 2023-11-20 DIAGNOSIS — Z79899 Other long term (current) drug therapy: Secondary | ICD-10-CM

## 2023-11-20 DIAGNOSIS — K5792 Diverticulitis of intestine, part unspecified, without perforation or abscess without bleeding: Secondary | ICD-10-CM

## 2023-11-20 DIAGNOSIS — K625 Hemorrhage of anus and rectum: Secondary | ICD-10-CM | POA: Diagnosis not present

## 2023-11-20 DIAGNOSIS — K5733 Diverticulitis of large intestine without perforation or abscess with bleeding: Secondary | ICD-10-CM | POA: Diagnosis not present

## 2023-11-20 DIAGNOSIS — R109 Unspecified abdominal pain: Secondary | ICD-10-CM | POA: Diagnosis not present

## 2023-11-20 DIAGNOSIS — R140 Abdominal distension (gaseous): Secondary | ICD-10-CM | POA: Diagnosis not present

## 2023-11-20 DIAGNOSIS — K219 Gastro-esophageal reflux disease without esophagitis: Secondary | ICD-10-CM | POA: Diagnosis present

## 2023-11-20 DIAGNOSIS — K8020 Calculus of gallbladder without cholecystitis without obstruction: Secondary | ICD-10-CM | POA: Diagnosis not present

## 2023-11-20 DIAGNOSIS — M79605 Pain in left leg: Secondary | ICD-10-CM

## 2023-11-20 DIAGNOSIS — K5731 Diverticulosis of large intestine without perforation or abscess with bleeding: Secondary | ICD-10-CM | POA: Diagnosis not present

## 2023-11-20 DIAGNOSIS — R590 Localized enlarged lymph nodes: Secondary | ICD-10-CM | POA: Diagnosis not present

## 2023-11-20 DIAGNOSIS — R0989 Other specified symptoms and signs involving the circulatory and respiratory systems: Secondary | ICD-10-CM | POA: Diagnosis not present

## 2023-11-20 DIAGNOSIS — G4733 Obstructive sleep apnea (adult) (pediatric): Secondary | ICD-10-CM | POA: Diagnosis present

## 2023-11-20 HISTORY — PX: IR EMBO ART  VEN HEMORR LYMPH EXTRAV  INC GUIDE ROADMAPPING: IMG5450

## 2023-11-20 LAB — COMPREHENSIVE METABOLIC PANEL WITH GFR
ALT: 10 U/L (ref 0–44)
AST: 13 U/L — ABNORMAL LOW (ref 15–41)
Albumin: 3 g/dL — ABNORMAL LOW (ref 3.5–5.0)
Alkaline Phosphatase: 73 U/L (ref 38–126)
Anion gap: 11 (ref 5–15)
BUN: 24 mg/dL — ABNORMAL HIGH (ref 8–23)
CO2: 27 mmol/L (ref 22–32)
Calcium: 8.7 mg/dL — ABNORMAL LOW (ref 8.9–10.3)
Chloride: 104 mmol/L (ref 98–111)
Creatinine, Ser: 0.9 mg/dL (ref 0.44–1.00)
GFR, Estimated: >60 mL/min (ref >60)
Glucose, Bld: 123 mg/dL — ABNORMAL HIGH (ref 70–99)
Potassium: 4.5 mmol/L (ref 3.5–5.1)
Sodium: 142 mmol/L (ref 135–145)
Total Bilirubin: 0.4 mg/dL (ref 0.0–1.2)
Total Protein: 5.3 g/dL — ABNORMAL LOW (ref 6.5–8.1)

## 2023-11-20 LAB — CBC WITH DIFFERENTIAL/PLATELET
Abs Immature Granulocytes: 0.03 K/uL (ref 0.00–0.07)
Abs Immature Granulocytes: 0.04 K/uL (ref 0.00–0.07)
Basophils Absolute: 0.1 K/uL (ref 0.0–0.1)
Basophils Absolute: 0.1 K/uL (ref 0.0–0.1)
Basophils Relative: 1 %
Basophils Relative: 1 %
Eosinophils Absolute: 0.2 K/uL (ref 0.0–0.5)
Eosinophils Absolute: 0.2 K/uL (ref 0.0–0.5)
Eosinophils Relative: 2 %
Eosinophils Relative: 1 %
HCT: 37.7 % (ref 36.0–46.0)
HCT: 35.4 % — ABNORMAL LOW (ref 36.0–46.0)
Hemoglobin: 11.7 g/dL — ABNORMAL LOW (ref 12.0–15.0)
Hemoglobin: 10.8 g/dL — ABNORMAL LOW (ref 12.0–15.0)
Immature Granulocytes: 0 %
Immature Granulocytes: 0 %
Lymphocytes Relative: 68 %
Lymphocytes Relative: 57 %
Lymphs Abs: 10.4 K/uL — ABNORMAL HIGH (ref 0.7–4.0)
Lymphs Abs: 8 K/uL — ABNORMAL HIGH (ref 0.7–4.0)
MCH: 30.3 pg (ref 26.0–34.0)
MCH: 30.2 pg (ref 26.0–34.0)
MCHC: 31 g/dL (ref 30.0–36.0)
MCHC: 30.5 g/dL (ref 30.0–36.0)
MCV: 97.7 fL (ref 80.0–100.0)
MCV: 98.9 fL (ref 80.0–100.0)
Monocytes Absolute: 0.7 K/uL (ref 0.1–1.0)
Monocytes Absolute: 0.7 K/uL (ref 0.1–1.0)
Monocytes Relative: 5 %
Monocytes Relative: 5 %
Neutro Abs: 3.5 K/uL (ref 1.7–7.7)
Neutro Abs: 5.2 K/uL (ref 1.7–7.7)
Neutrophils Relative %: 24 %
Neutrophils Relative %: 36 %
Platelets: 263 K/uL (ref 150–400)
Platelets: 219 K/uL (ref 150–400)
RBC: 3.86 MIL/uL — ABNORMAL LOW (ref 3.87–5.11)
RBC: 3.58 MIL/uL — ABNORMAL LOW (ref 3.87–5.11)
RDW: 14.8 % (ref 11.5–15.5)
RDW: 14.7 % (ref 11.5–15.5)
Smear Review: NORMAL
Smear Review: NORMAL
WBC: 15 K/uL — ABNORMAL HIGH (ref 4.0–10.5)
WBC: 14.1 K/uL — ABNORMAL HIGH (ref 4.0–10.5)
nRBC: 0 % (ref 0.0–0.2)
nRBC: 0 % (ref 0.0–0.2)

## 2023-11-20 LAB — LACTIC ACID, PLASMA: Lactic Acid, Venous: 1.2 mmol/L (ref 0.5–1.9)

## 2023-11-20 LAB — TROPONIN I (HIGH SENSITIVITY)
Troponin I (High Sensitivity): 10 ng/L (ref <18)
Troponin I (High Sensitivity): 7 ng/L (ref <18)

## 2023-11-20 LAB — BRAIN NATRIURETIC PEPTIDE: B Natriuretic Peptide: 124.3 pg/mL — ABNORMAL HIGH (ref 0.0–100.0)

## 2023-11-20 LAB — PREPARE RBC (CROSSMATCH)

## 2023-11-20 LAB — ABO/RH: ABO/RH(D): A POS

## 2023-11-20 LAB — PATHOLOGIST SMEAR REVIEW

## 2023-11-20 LAB — HEMOGLOBIN
Hemoglobin: 10.9 g/dL — ABNORMAL LOW (ref 12.0–15.0)
Hemoglobin: 9.3 g/dL — ABNORMAL LOW (ref 12.0–15.0)

## 2023-11-20 LAB — C-REACTIVE PROTEIN: CRP: 0.6 mg/dL (ref <1.0)

## 2023-11-20 LAB — PROTIME-INR
INR: 1.2 (ref 0.8–1.2)
Prothrombin Time: 16 s — ABNORMAL HIGH (ref 11.4–15.2)

## 2023-11-20 LAB — PROCALCITONIN: Procalcitonin: <0.1 ng/mL

## 2023-11-20 LAB — APTT: aPTT: 33 s (ref 24–36)

## 2023-11-20 MED ORDER — FENTANYL CITRATE PF 50 MCG/ML IJ SOSY
12.5000 ug | PREFILLED_SYRINGE | INTRAMUSCULAR | Status: DC | PRN
  Administered 2023-11-21 – 2023-11-26 (×5): 12.5 ug via INTRAVENOUS
  Filled 2023-11-20 (×5): qty 1

## 2023-11-20 MED ORDER — LIDOCAINE 1 % OPTIME INJ - NO CHARGE
5.0000 mL | Freq: Once | INTRAMUSCULAR | Status: AC
  Administered 2023-11-20: 5 mL via INTRADERMAL

## 2023-11-20 MED ORDER — PIPERACILLIN-TAZOBACTAM 3.375 G IVPB
3.3750 g | Freq: Three times a day (TID) | INTRAVENOUS | Status: DC
  Administered 2023-11-20 – 2023-11-28 (×23): 3.375 g via INTRAVENOUS
  Filled 2023-11-20 (×23): qty 50

## 2023-11-20 MED ORDER — PIPERACILLIN-TAZOBACTAM 3.375 G IVPB
3.3750 g | Freq: Once | INTRAVENOUS | Status: AC
  Administered 2023-11-20: 3.375 g via INTRAVENOUS
  Filled 2023-11-20: qty 50

## 2023-11-20 MED ORDER — GABAPENTIN 400 MG PO CAPS
400.0000 mg | ORAL_CAPSULE | Freq: Four times a day (QID) | ORAL | Status: DC
  Administered 2023-11-20 – 2023-11-28 (×28): 400 mg via ORAL
  Filled 2023-11-20 (×30): qty 1

## 2023-11-20 MED ORDER — LIDOCAINE HCL 1 % IJ SOLN
INTRAMUSCULAR | Status: AC
  Filled 2023-11-20: qty 20

## 2023-11-20 MED ORDER — ALBUMIN HUMAN 25 % IV SOLN
50.0000 g | Freq: Once | INTRAVENOUS | Status: AC
  Administered 2023-11-20 (×2): 25 g via INTRAVENOUS
  Filled 2023-11-20: qty 200

## 2023-11-20 MED ORDER — FENTANYL CITRATE (PF) 100 MCG/2ML IJ SOLN
INTRAMUSCULAR | Status: AC | PRN
  Administered 2023-11-20 (×2): 25 ug via INTRAVENOUS

## 2023-11-20 MED ORDER — MORPHINE SULFATE (PF) 2 MG/ML IV SOLN
2.0000 mg | Freq: Once | INTRAVENOUS | Status: AC
  Administered 2023-11-20: 2 mg via INTRAVENOUS
  Filled 2023-11-20: qty 1

## 2023-11-20 MED ORDER — ALBUTEROL SULFATE (2.5 MG/3ML) 0.083% IN NEBU: 2.5000 mg | INHALATION_SOLUTION | RESPIRATORY_TRACT | Status: DC | PRN

## 2023-11-20 MED ORDER — MIDAZOLAM HCL 2 MG/2ML IJ SOLN
INTRAMUSCULAR | Status: AC
  Filled 2023-11-20: qty 2

## 2023-11-20 MED ORDER — SUCRALFATE 1 G PO TABS
1.0000 g | ORAL_TABLET | Freq: Every day | ORAL | Status: DC
  Administered 2023-11-20 – 2023-11-28 (×9): 1 g via ORAL
  Filled 2023-11-20 (×9): qty 1

## 2023-11-20 MED ORDER — IOHEXOL 300 MG/ML  SOLN
80.0000 mL | Freq: Once | INTRAMUSCULAR | Status: AC | PRN
  Administered 2023-11-20: 80 mL via INTRA_ARTERIAL

## 2023-11-20 MED ORDER — MIDAZOLAM HCL 2 MG/2ML IJ SOLN
INTRAMUSCULAR | Status: AC | PRN
Start: 2023-11-20 — End: 2023-11-20
  Administered 2023-11-20: .5 mg via INTRAVENOUS

## 2023-11-20 MED ORDER — ALBUMIN HUMAN 25 % IV SOLN
50.0000 g | Freq: Once | INTRAVENOUS | Status: DC
  Filled 2023-11-20: qty 200

## 2023-11-20 MED ORDER — SODIUM CHLORIDE 0.9% IV SOLUTION
Freq: Once | INTRAVENOUS | Status: AC
  Filled 2023-11-20: qty 250

## 2023-11-20 MED ORDER — IOHEXOL 350 MG/ML SOLN
125.0000 mL | Freq: Once | INTRAVENOUS | Status: AC | PRN
  Administered 2023-11-20: 125 mL via INTRAVENOUS

## 2023-11-20 MED ORDER — METHOCARBAMOL 500 MG PO TABS
500.0000 mg | ORAL_TABLET | Freq: Three times a day (TID) | ORAL | Status: DC | PRN
  Administered 2023-11-20 – 2023-11-26 (×2): 500 mg via ORAL
  Filled 2023-11-20 (×2): qty 1

## 2023-11-20 MED ORDER — SODIUM CHLORIDE 0.9 % IV SOLN: INTRAVENOUS | Status: AC

## 2023-11-20 MED ORDER — FENTANYL CITRATE (PF) 100 MCG/2ML IJ SOLN
INTRAMUSCULAR | Status: AC
  Filled 2023-11-20: qty 2

## 2023-11-20 NOTE — ED Notes (Signed)
 Fall precautions in place for Pt. This RN placed fall band, fall grip socks, bed alarm and fall sign.

## 2023-11-20 NOTE — H&P (Signed)
 History and Physical    Jamie Phillips FMW:[id number] DOB: Dec 07, 1940 DOA: 11/20/2023  PCP: Lenon Layman ORN, MD  Patient coming from: home  I have personally briefly reviewed patient's old medical records in [facility]  Chief Complaint:  woke up in pool of blood  HPI: Jamie Phillips is a 83 y.o. 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  Patient also notes associated abdominal pain with passing clots. Patient notes loose stools yesterday but no blood or abdominal pain. She denies fever/chills/ n/v/ dysuria,sob/ or chest pain. She does endorse feeling week. She notes she has has 3 episodes of bleeding since being in ED.  She notes she last took her  Eliquis  last evening.   ED Course:  Patient on evaluation in ED was found to have stable vitals with CT bleed scan noting diverticulitis and active gi bleed. Patient hgb was noted to have 1 point drop. Patient was referred for admission as well as to IR for possible intervention.   Vitals: Na 142, K4.5, CL 104, glu 123, cr 0.9, CA 8.7, AST 13 INR 1.2  EKG: NSR, LVH,prolonged QT NA 142, K 4.5, Cl 104, bicarb 27, Glu123, cr 0.9, AST13 Wbc 15, hgb 11.7 (10.8) CE 10 BNP 124.3 Procal 0.10 Morphine  2mg    Ctbleed Narrative & Impression  CLINICAL DATA:  rectal bleeding, RLQ pain.   EXAM: CTA ABDOMEN AND PELVIS WITHOUT AND WITH CONTRAST   TECHNIQUE: Multidetector CT imaging of the abdomen and pelvis was performed using the standard protocol during bolus administration of intravenous contrast. Multiplanar reconstructed images and MIPs were obtained and reviewed to evaluate the vascular anatomy.   RADIATION DOSE REDUCTION: This exam was performed according to the departmental dose-optimization program which  includes automated exposure control, adjustment of the mA and/or kV according to patient size and/or use of iterative reconstruction technique.   CONTRAST:  OMNIPAQUE  IOHEXOL  350 MG/ML SOLN   COMPARISON:  CT scan abdomen and pelvis from 10/14/2018.   FINDINGS: VASCULAR   Aorta: Normal caliber aorta without aneurysm, dissection, vasculitis or significant stenosis.   Celiac: Patent without evidence of aneurysm, dissection, vasculitis or significant stenosis.   SMA: Patent without evidence of aneurysm, dissection, vasculitis or significant stenosis.   Renals: Both renal arteries are patent without evidence of aneurysm, dissection, vasculitis, fibromuscular dysplasia or significant stenosis.   IMA: Patent without evidence of aneurysm, dissection, vasculitis or significant stenosis.   Inflow: Patent without evidence of aneurysm, dissection, vasculitis or significant stenosis.   Proximal Outflow: Bilateral common femoral and visualized portions of the superficial and profunda femoral arteries are patent without evidence of aneurysm, dissection, vasculitis or significant stenosis.   Veins: No obvious venous abnormality within the limitations of this arterial phase study.   Review of the MIP images confirms the above findings.   NON-VASCULAR   Lower chest: There are patchy atelectatic changes in the visualized lung bases. No overt consolidation. No pleural effusion. The heart is normal in size. No pericardial effusion.   Hepatobiliary: The liver is normal in size. Non-cirrhotic configuration. No suspicious mass. No intrahepatic or extrahepatic bile duct dilation. Moderate-to-large volume gallstones noted without imaging signs of acute cholecystitis. Normal gallbladder wall thickness. No pericholecystic inflammatory changes.   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 Otherwise unremarkable pancreas. No  pancreatic ductal dilatation or surrounding inflammatory changes.   Spleen: Within normal limits. No focal lesion.   Adrenals/Urinary Tract: Adrenal glands are unremarkable. No suspicious renal mass. There is a predominantly exophytic cyst arising from the right kidney lower pole, laterally measuring 3.4 x 4.0 cm, which has significantly increased in size since the prior study however, no interval development of aggressive features. No routine follow-up is recommended. No nephroureterolithiasis or obstructive uropathy bladder wall trabeculations and pseudo diverticula noted, suggesting sequela of chronic urinary outflow obstruction.   Stomach/Bowel: Postsurgical changes from prior right hemicolectomy noted. Ileocolonic anastomosis noted in the right upper quadrant, anteriorly. No disproportionate dilation of the small or large bowel loops. Liquid unformed stool noted in the left hemicolon. There are multiple diverticula throughout the colon there is a small grouping of diverticula in the distal descending colon/proximal sigmoid colon which exhibit mild surrounding fat stranding without obvious wall thickening, favoring probable smoldering diverticulitis. In this region (as seen on series 26, images 131-134 and series 14, images 52-57), there is 3 centric hyperattenuating area seen on the arterial phase images, which increases in size/volume on the portal venous phase images, compatible with active GI bleeding, likely related to diverticulitis.   There are additional, 2 well-circumscribed hyperattenuating foci in the distal sigmoid colon (series 4, image 68), which changes position on different phases of exam and are favored to represent stool material.   Vascular/Lymphatic: No ascites or pneumoperitoneum. There are multiple mildly enlarged retroperitoneal and right upper quadrant mesenteric lymph nodes which have mildly increased in size since the prior study from 2020. These are  indeterminate in etiology but favored benign/reactive in the given clinical setting. Attention on follow-up examination is recommended.   Reproductive: Not well evaluated on the current exam. However, note is made of a lobulated uterus. There is an approximately 3.0 x 3.4 cm lesion in the right side of the fundus of the uterus, favored to represent a leiomyoma. There also additional several calcified leiomyomas. There is a well-circumscribed oval 2.6 x 3.2 cm fluid attenuation structure in the left adnexa, favored to represent ovarian in etiology. This is favored to represent senescent ovarian cyst considering, it is essentially unchanged since the prior study from 2020; however, remains incompletely characterized. No large right adnexal mass seen.   Other: Redemonstration of moderate sized midline/left paramedian periumbilical ventral hernia containing short loop of nondilated transverse colon and fat. The soft tissues and abdominal wall are otherwise unremarkable.   Musculoskeletal: No suspicious osseous lesions. There are moderate multilevel degenerative changes in the visualized spine.   IMPRESSION: 1. Findings favor smoldering diverticulitis at the junction of distal descending colon/proximal sigmoid colon, as described in detail above. There is active GI bleeding in this region, favored diverticular in origin. 2. Interval enlargement of multiple retroperitoneal and right upper quadrant mesenteric lymph nodes, as discussed in detail above. Attention on follow-up examination is recommended. 3. 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     Tx zosyn  3.375  Cxr low lung volumes otherwise NAD   Review of Systems: As per HPI otherwise 10 point review  of systems negative.   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)    Macular degeneration    MGUS (monoclonal gammopathy of unknown significance)    Morbid obesity (HCC)    Obesity    Pre-diabetes    Pulmonary embolism (HCC)    PVD (peripheral vascular disease) (HCC)    RLS (restless legs syndrome)    Sleep apnea    CPAP    Past Surgical History:  Procedure Laterality Date   APPENDECTOMY     BREAST BIOPSY Left 08/19/2004   lt bx/clip-neg   BREAST BIOPSY Left 03/05/2023   US  BX heart clip, path pending-   BREAST BIOPSY Left 03/05/2023   US  BX hydromark coil clip axilla node- path pending   BREAST BIOPSY Left 03/05/2023   US  LT BREAST BX W LOC DEV 1ST LESION IMG BX SPEC US  GUIDE 03/05/2023 [facility]   CATARACT EXTRACTION W/PHACO Left 09/10/2022   Procedure: CATARACT EXTRACTION PHACO AND INTRAOCULAR LENS PLACEMENT (IOC) LEFT  5.88  00:31.9;  Surgeon: Jaye Fallow, MD;  Location: [facility];  Service: Ophthalmology;  Laterality: Left;  sleep apnea   COLONOSCOPY     COLONOSCOPY WITH PROPOFOL  N/A 03/10/2017   Procedure: COLONOSCOPY WITH PROPOFOL ;  Surgeon: Gaylyn Gladis PENNER, MD;  Location: [facility];  Service: Endoscopy;  Laterality: N/A;   DILATION AND CURETTAGE OF UTERUS     ESOPHAGOGASTRODUODENOSCOPY (EGD) WITH PROPOFOL  N/A 02/02/2019   Procedure: ESOPHAGOGASTRODUODENOSCOPY (EGD) WITH PROPOFOL ;  Surgeon: Toledo, Ladell POUR, MD;  Location: [facility] ENDOSCOPY;  Service: Endoscopy;   Laterality: N/A;   EYE SURGERY     IVC FILTER INSERTION N/A 06/11/2016   Procedure: IVC Filter Insertion;  Surgeon: Cordella KANDICE Shawl, MD;  Location: [facility] INVASIVE CV [facility];  Service: Cardiovascular;  Laterality: N/A;   IVC FILTER REMOVAL N/A 05/06/2017   Procedure: IVC FILTER REMOVAL;  Surgeon: Shawl Cordella KANDICE, MD;  Location: [facility];  Service: Cardiovascular;  Laterality: N/A;   JOINT REPLACEMENT Left 06/17/2016   Knees, pt stated left and right have been replaced.   LAPAROSCOPIC APPENDECTOMY N/A 10/10/2018   Procedure: APPENDECTOMY LAPAROSCOPIC ATTEMPTED CONVERTED TO OPEN;  Surgeon: Rodolph Romano, MD;  Location: [facility];  Service: General;  Laterality: N/A;   LAPAROSCOPY N/A 10/10/2018   Procedure: LAPAROSCOPY DIAGNOSTIC ATTEMPTED CONVERTED TO OPEN;  Surgeon: Rodolph Romano, MD;  Location: [facility];  Service: General;  Laterality: N/A;   LUMBAR LAMINECTOMY/DECOMPRESSION MICRODISCECTOMY N/A 07/09/2021   Procedure: Laminectomy and Foraminotomy - Lumbar Two-Lumbar Three - Lumbar Three-Lumbar Four - Lumbar Four-Lumbar Five;  Surgeon: Louis Shove, MD;  Location: [facility];  Service: Neurosurgery;  Laterality: N/A;   neck fusion     OPEN REDUCTION INTERNAL FIXATION (ORIF) WRIST WITH ILIAC CREST BONE GRAFT Left    PARTIAL COLECTOMY Right 10/10/2018   Procedure: PARTIAL COLECTOMY;  Surgeon: Rodolph Romano, MD;  Location: [facility];  Service: General;  Laterality: Right;   PERIPHERAL VASCULAR CATHETERIZATION N/A 08/15/2015   Procedure: pulmonary angiogram with lysis;  Surgeon: Cordella KANDICE Shawl, MD;  Location: [facility];  Service: Cardiovascular;  Laterality: N/A;   WRIST SURGERY Left 2000     reports that she has never smoked. She has never used smokeless tobacco. She reports that she does not drink alcohol and does not use drugs.  No Known Allergies  Family History  Problem Relation Age of Onset   Pulmonary embolism Mother    Sudden death Mother     Obesity Mother    Heart failure Father    Congestive Heart Failure Father    Sudden death Father    Thyroid  cancer Brother    Breast cancer Neg Hx     Prior to Admission medications   Medication Sig Start Date End Date Taking? Authorizing Provider  acetaminophen  (TYLENOL ) 650 MG CR tablet Take 650-1,300 mg by mouth every 8 (eight) hours as needed for pain. TYLENOL  ARTHRITIS   Yes [provider]  apixaban  (ELIQUIS ) 5 MG TABS tablet Take 1 tablet (5 mg total) by mouth 2 (two) times daily. 09/28/21  Yes Brahmanday, Govinda R, MD  b complex vitamins capsule Take 1 capsule by mouth daily.   Yes [provider]  calcium  carbonate (OSCAL) 1500 (600 Ca) MG TABS tablet Take 1,500 mg by mouth 2 (two) times daily with a meal.   Yes [provider]  Cholecalciferol (VITAMIN D -3) 125 MCG (5000 UT) TABS Take by mouth daily.   Yes [provider]  DULoxetine  (CYMBALTA ) 60 MG capsule Take 1 capsule (60 mg total) by mouth daily. With Other SSRI 03/31/23  Yes Lovorn, Megan, MD  escitalopram  (LEXAPRO ) 20 MG tablet Take 1 tablet by mouth daily. 05/20/22  Yes [provider]  gabapentin  (NEURONTIN ) 400 MG capsule Take 1 capsule (400 mg total) by mouth 4 (four) times daily. Take 1 pill in Am and 2 pills at night- for nerve pain 03/31/23  Yes Lovorn, Megan, MD  HYDROcodone -acetaminophen  (NORCO) 10-325 MG tablet Take 1 tablet by mouth every 6 (six) hours as needed. 09/03/23  Yes Debby Fidela CROME, NP  losartan -hydrochlorothiazide  (HYZAAR) 100-12.5 MG tablet Take 0.5 tablets by mouth daily. Reported on 08/22/2015 06/18/21  Yes Berkeley Adelita PENNER, MD  magnesium  gluconate (MAGONATE) 500 MG tablet Take 500 mg by mouth 2 (two) times daily.   Yes [provider]  methocarbamol  (ROBAXIN ) 500 MG tablet TAKE 1 TABLET (500 MG TOTAL) EVERY 8 (EIGHT) HOURS AS NEEDED FOR MUSCLE SPASMS. 05/23/23  Yes Lovorn, Megan, MD  Multiple Vitamins-Minerals (PRESERVISION AREDS 2 PO) Take 1  tablet by mouth in the morning and at bedtime.   Yes [provider]  pantoprazole  (PROTONIX ) 40 MG tablet  10/25/23  Yes [provider]  pravastatin  (PRAVACHOL ) 40 MG tablet Take 1 tablet by mouth daily. 06/27/21  Yes [provider]  propranolol  (INDERAL ) 20 MG tablet Take 1 tablet by mouth 2 (two) times daily. 08/14/23  Yes [provider]  sucralfate  (CARAFATE ) 1 g tablet Take 1 g by mouth daily.   Yes [provider]  torsemide (DEMADEX) 20 MG tablet  07/29/23  Yes [provider]  TURMERIC CURCUMIN PO Take 1 capsule by mouth in the morning and at bedtime.   Yes [provider]  cholestyramine  (QUESTRAN ) 4 g packet Take 1 packet (4 g total) by mouth 3 (three) times daily with meals. 04/30/21   Berkeley Adelita PENNER, MD    Physical Exam: Vitals:   11/20/23 0832 11/20/23 1150 11/20/23 1200 11/20/23 1215  BP:  (!) 119/59 105/61   Pulse:  61 (!) 58 (!) 59  Resp:  20 16 (!) 24  Temp:      TempSrc:      SpO2:  98% 97% 96%  Weight: 109.9 kg     Height: 5' 4 (1.626 m)       Constitutional: appears unwell, calm, mild discomfort due to abdominal pain Vitals:   11/20/23 0832 11/20/23 1150 11/20/23 1200 11/20/23 1215  BP:  (!) 119/59 105/61   Pulse:  61 (!) 58 (!) 59  Resp:  20 16 (!) 24  Temp:      TempSrc:      SpO2:  98% 97% 96%  Weight: 109.9 kg     Height: 5' 4 (1.626 m)      Eyes: pupils are equal, lids and conjunctivae pale ENMT: Mucous membranes are moist. Posterior pharynx clear of any exudate or lesions.Normal dentition.  Neck: normal, supple, no masses, no thyromegaly Respiratory: clear to auscultation bilaterally, no wheezing, no crackles. Normal respiratory effort. No accessory muscle use.  Cardiovascular: Regular rate and rhythm, no murmurs / rubs / gallops. No extremity edema. 2+ pedal pulses. No carotid bruits.  Abdomen: + tenderness,lower quad, no masses palpated. No hepatosplenomegaly. Bowel sounds positive.   Musculoskeletal: no clubbing / cyanosis. No joint deformity upper and lower extremities. Good ROM, no contractures. Normal muscle tone.  Skin: no rashes, lesions, ulcers. No induration Neurologic: CN grossly intact. Sensation intact, Strength 5/5 in all 4.  Psychiatric: Normal judgment and insight. Alert and oriented x 3. Normal mood.    Labs on Admission: I have personally reviewed following labs and imaging studies  CBC: Recent Labs  Lab 11/18/23 1012 11/20/23 0826  WBC 12.2* 15.0*  NEUTROABS 4.8 3.5  HGB 12.2 11.7*  HCT 38.8 37.7  MCV 96.8 97.7  PLT 217 263   Basic Metabolic Panel: Recent Labs  Lab 11/18/23 1012 11/20/23 0826  NA 138 142  K 4.1 4.5  CL 100 104  CO2 31 27  GLUCOSE 77 123*  BUN 28* 24*  CREATININE 1.12* 0.90  CALCIUM  9.0 8.7*   GFR: Estimated Creatinine Clearance: 59.4 mL/min (by C-G formula based on SCr of 0.9 mg/dL). Liver Function Tests: Recent Labs  Lab 11/18/23 1012 11/20/23 0826  AST 14* 13*  ALT 11 10  ALKPHOS 86 73  BILITOT 0.6 0.4  PROT 6.0* 5.3*  ALBUMIN  3.5 3.0*   No results for input(s): LIPASE, AMYLASE in the last 168 hours. No results for input(s): AMMONIA in the last 168 hours. Coagulation Profile: Recent Labs  Lab 11/20/23 0826  INR 1.2   Cardiac Enzymes: No results for input(s): CKTOTAL, CKMB, CKMBINDEX, TROPONINI in the last 168 hours. BNP (last 3 results) No results for input(s): PROBNP in the last 8760 hours. HbA1C: No results for input(s): HGBA1C in the last 72 hours. CBG: No results for input(s): GLUCAP in the last 168 hours. Lipid Profile: No results for input(s): CHOL, HDL, LDLCALC, TRIG, CHOLHDL, LDLDIRECT in the last 72 hours. Thyroid  Function Tests: No results for input(s): TSH, T4TOTAL, FREET4, T3FREE, THYROIDAB in the last 72 hours. Anemia Panel: No results for input(s): VITAMINB12, FOLATE, FERRITIN, TIBC, IRON, RETICCTPCT in the last 72  hours. Urine analysis:    Component Value Date/Time   COLORURINE YELLOW (A) 12/13/2018 1648   APPEARANCEUR CLOUDY (A) 12/13/2018 1648   APPEARANCEUR Hazy 04/29/2012 1921   LABSPEC 1.025 12/13/2018 1648   LABSPEC 1.016 04/29/2012 1921   PHURINE 5.0 12/13/2018 1648   GLUCOSEU NEGATIVE 12/13/2018 1648  GLUCOSEU Negative 04/29/2012 1921   HGBUR NEGATIVE 12/13/2018 1648   BILIRUBINUR NEGATIVE 12/13/2018 1648   BILIRUBINUR Negative 04/29/2012 1921   KETONESUR NEGATIVE 12/13/2018 1648   PROTEINUR 30 (A) 12/13/2018 1648   NITRITE NEGATIVE 12/13/2018 1648   LEUKOCYTESUR NEGATIVE 12/13/2018 1648   LEUKOCYTESUR Trace 04/29/2012 1921    Radiological Exams on Admission: CT ANGIO GI BLEED Result Date: 11/20/2023 CLINICAL DATA:  rectal bleeding, RLQ pain. EXAM: CTA ABDOMEN AND PELVIS WITHOUT AND WITH CONTRAST TECHNIQUE: Multidetector CT imaging of the abdomen and pelvis was performed using the standard protocol during bolus administration of intravenous contrast. Multiplanar reconstructed images and MIPs were obtained and reviewed to evaluate the vascular anatomy. RADIATION DOSE REDUCTION: This exam was performed according to the departmental dose-optimization program which includes automated exposure control, adjustment of the mA and/or kV according to patient size and/or use of iterative reconstruction technique. CONTRAST:  OMNIPAQUE  IOHEXOL  350 MG/ML SOLN COMPARISON:  CT scan abdomen and pelvis from 10/14/2018. FINDINGS: VASCULAR Aorta: Normal caliber aorta without aneurysm, dissection, vasculitis or significant stenosis. Celiac: Patent without evidence of aneurysm, dissection, vasculitis or significant stenosis. SMA: Patent without evidence of aneurysm, dissection, vasculitis or significant stenosis. Renals: Both renal arteries are patent without evidence of aneurysm, dissection, vasculitis, fibromuscular dysplasia or significant stenosis. IMA: Patent without evidence of aneurysm, dissection,  vasculitis or significant stenosis. Inflow: Patent without evidence of aneurysm, dissection, vasculitis or significant stenosis. Proximal Outflow: Bilateral common femoral and visualized portions of the superficial and profunda femoral arteries are patent without evidence of aneurysm, dissection, vasculitis or significant stenosis. Veins: No obvious venous abnormality within the limitations of this arterial phase study. Review of the MIP images confirms the above findings. NON-VASCULAR Lower chest: There are patchy atelectatic changes in the visualized lung bases. No overt consolidation. No pleural effusion. The heart is normal in size. No pericardial effusion. Hepatobiliary: The liver is normal in size. Non-cirrhotic configuration. No suspicious mass. No intrahepatic or extrahepatic bile duct dilation. Moderate-to-large volume gallstones noted without imaging signs of acute cholecystitis. Normal gallbladder wall thickness. No pericholecystic inflammatory changes. 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 Otherwise unremarkable pancreas. No pancreatic ductal dilatation or surrounding inflammatory changes. Spleen: Within normal limits. No focal lesion. Adrenals/Urinary Tract: Adrenal glands are unremarkable. No suspicious renal mass. There is a predominantly exophytic cyst arising from the right kidney lower pole, laterally measuring 3.4 x 4.0 cm, which has significantly increased in size since the prior study however, no interval development of aggressive features. No routine follow-up is recommended. No nephroureterolithiasis or obstructive uropathy bladder wall trabeculations and pseudo diverticula noted, suggesting sequela of chronic urinary outflow obstruction. Stomach/Bowel: Postsurgical changes from prior right hemicolectomy noted. Ileocolonic anastomosis noted in the right upper quadrant, anteriorly. No disproportionate dilation of the small or large  bowel loops. Liquid unformed stool noted in the left hemicolon. There are multiple diverticula throughout the colon there is a small grouping of diverticula in the distal descending colon/proximal sigmoid colon which exhibit mild surrounding fat stranding without obvious wall thickening, favoring probable smoldering diverticulitis. In this region (as seen on series 26, images 131-134 and series 14, images 52-57), there is 3 centric hyperattenuating area seen on the arterial phase images, which increases in size/volume on the portal venous phase images, compatible with active GI bleeding, likely related to diverticulitis. There are additional, 2 well-circumscribed hyperattenuating foci in the distal sigmoid colon (series 4, image 68), which changes position on different  phases of exam and are favored to represent stool material. Vascular/Lymphatic: No ascites or pneumoperitoneum. There are multiple mildly enlarged retroperitoneal and right upper quadrant mesenteric lymph nodes which have mildly increased in size since the prior study from 2020. These are indeterminate in etiology but favored benign/reactive in the given clinical setting. Attention on follow-up examination is recommended. Reproductive: Not well evaluated on the current exam. However, note is made of a lobulated uterus. There is an approximately 3.0 x 3.4 cm lesion in the right side of the fundus of the uterus, favored to represent a leiomyoma. There also additional several calcified leiomyomas. There is a well-circumscribed oval 2.6 x 3.2 cm fluid attenuation structure in the left adnexa, favored to represent ovarian in etiology. This is favored to represent senescent ovarian cyst considering, it is essentially unchanged since the prior study from 2020; however, remains incompletely characterized. No large right adnexal mass seen. Other: Redemonstration of moderate sized midline/left paramedian periumbilical ventral hernia containing short loop of  nondilated transverse colon and fat. The soft tissues and abdominal wall are otherwise unremarkable. Musculoskeletal: No suspicious osseous lesions. There are moderate multilevel degenerative changes in the visualized spine. IMPRESSION: 1. Findings favor smoldering diverticulitis at the junction of distal descending colon/proximal sigmoid colon, as described in detail above. There is active GI bleeding in this region, favored diverticular in origin. 2. Interval enlargement of multiple retroperitoneal and right upper quadrant mesenteric lymph nodes, as discussed in detail above. Attention on follow-up examination is recommended. 3. 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 Electronically Signed   By: Ree Molt M.D.   On: 11/20/2023 10:54    EKG: Independently reviewed.   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  -insetting of  CLL/SLL  -if h/h continue to drop despite support with PRBC will consider IR embolization if rebleed,per IR good available targets -other wise will hold Eliquis   -once h/h stable will start heparin  low dose w/o bolus    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  -no active issues     OSA  -resume CPAP  HLD -resume oral medication once tolerating po    GERD -ppi  Pre diabetes -monitor poc     DjD of lumbar spine -supportive care    DVT prophylaxis: SCD Code Status: full/ as discussed per patient wishes in event of cardiac arrest  Family Communication: full/ as discussed per patient wishes in event of cardiac arrest  Disposition Plan: patient  expected to be admitted greater than 2 midnights  Consults called: IR Dr Simone Admission status: progressive   Camila DELENA Ned MD [facility]   If 7PM-7AM, please contact night-coverage [email or web address] Password [facility]  11/20/2023, 12:25 PM

## 2023-11-20 NOTE — ED Notes (Signed)
 Pt given update from LCSW on re-appeal

## 2023-11-20 NOTE — ED Triage Notes (Signed)
 Pt arrives via [facility] for GI bleed from home Pt c/o woke up in a pool of blood, bright red with clots, no stool Last BM was yesterday, Pt states her normal is diarrhea states she has it chronically EMS interventions: #18 LAC EMS assessment RLQ tender and felt worst on palpation  MedHx:Eliquis  Hx: Abdominal issues, blood clot in her left leg that went to her lung  #18LAC  EMS:138/94, HR: 87, 97% RA,

## 2023-11-20 NOTE — Procedures (Signed)
 Interventional Radiology Procedure Note  Procedure: Angiogram and embolization of small active bleed in the sigmoid colon  Complications: None  Estimated Blood Loss: None  Recommendations: - Bedrest - Trend H&H and transfuse as needed    Signed,  Wilkie LOIS Lent, MD

## 2023-11-20 NOTE — ED Notes (Signed)
 Attending notified of soft BP, stated we can hold off on albumin  until after 1 unit of blood finished transfusion.

## 2023-11-20 NOTE — ED Notes (Signed)
 Assisted RN change pt brief and gown at this time.

## 2023-11-20 NOTE — ED Notes (Signed)
 Lab notified of new orders and stated they can add on labs from tubes sent downstairs. Only tube they need is lactic. This RN will pull.

## 2023-11-20 NOTE — ED Notes (Signed)
 CCMD notified and Pt placed on cardiac monitoring

## 2023-11-20 NOTE — ED Notes (Signed)
 Assisted RN change pt brief at this time.

## 2023-11-20 NOTE — ED Notes (Signed)
 Attending notified of Pt's soaked brief, looking pale and SB on cardiac monitor. See new orders.

## 2023-11-20 NOTE — ED Provider Notes (Signed)
 [facility] Provider Note    Event Date/Time   First MD Initiated Contact with Patient 11/20/23 0827     (approximate)   History   Rectal Bleeding   HPI  Jamie Phillips is a 83 y.o. female with history of CLL, history of prior PE on Eliquis , GERD, presenting with rectal bleeding.  Patient woke up, states that she felt like her bed was wet, notices blood.  Did take her Eliquis  last night.  She notes right lower quadrant abdominal pain.  Did have a bowel movement yesterday, patient states that she has chronic diarrhea and so the diarrhea yesterday was normal for her, nonbloody.  No nausea or vomiting.  No chest pain or shortness of breath, no recent trauma or falls.  Per independent history from EMS, she had moved from her bed to a chair but there was blood noted on the chair, no active bleeding for them, her vitals were stable.   On independent review, she was seen by oncology at the end of July, has history of massive pulmonary embolism in 2017, she is status post thrombolysis as well as IVC filter explantation, was continued on Eliquis  but there was a note discussing consideration for decreasing Eliquis  during her next visit.  Also history of chronic CLL, continued surveillance.  Physical Exam   Triage Vital Signs: ED Triage Vitals  Encounter Vitals Group     BP      Girls Systolic BP Percentile      Girls Diastolic BP Percentile      Boys Systolic BP Percentile      Boys Diastolic BP Percentile      Pulse      Resp      Temp      Temp src      SpO2      Weight      Height      Head Circumference      Peak Flow      Pain Score      Pain Loc      Pain Education      Exclude from Growth Chart     Most recent vital signs: Vitals:   11/20/23 1200 11/20/23 1215  BP: 105/61   Pulse: (!) 58 (!) 59  Resp: 16 (!) 24  Temp:    SpO2: 97% 96%     General: Awake, no distress.  CV:  Good peripheral perfusion.  Resp:  Normal effort.  Abd:  No  distention.  Soft, no guarding, tender at the right lower quadrant Other:  Rectal exam was done as chaperone, there was some dark red dried blood on her diaper, no obvious external hemorrhoids, rectal exam without stool in the rectal vault but there is dark maroon blood.  No active bleeding from her rectum at this time.   ED Results / Procedures / Treatments   Labs (all labs ordered are listed, but only abnormal results are displayed) Labs Reviewed  COMPREHENSIVE METABOLIC PANEL WITH GFR - Abnormal; Notable for the following components:      Result Value   Glucose, Bld 123 (*)    BUN 24 (*)    Calcium  8.7 (*)    Total Protein 5.3 (*)    Albumin  3.0 (*)    AST 13 (*)    All other components within normal limits  CBC WITH DIFFERENTIAL/PLATELET - Abnormal; Notable for the following components:   WBC 15.0 (*)    RBC 3.86 (*)  Hemoglobin 11.7 (*)    Lymphs Abs 10.4 (*)    All other components within normal limits  PROTIME-INR - Abnormal; Notable for the following components:   Prothrombin Time 16.0 (*)    All other components within normal limits  CBC WITH DIFFERENTIAL/PLATELET - Abnormal; Notable for the following components:   WBC 14.1 (*)    RBC 3.58 (*)    Hemoglobin 10.8 (*)    HCT 35.4 (*)    All other components within normal limits  PATHOLOGIST SMEAR REVIEW  BRAIN NATRIURETIC PEPTIDE  PROCALCITONIN  LACTIC ACID, PLASMA  LACTIC ACID, PLASMA  C-REACTIVE PROTEIN  TYPE AND SCREEN  PREPARE RBC (CROSSMATCH)  ABO/RH  TROPONIN I (HIGH SENSITIVITY)     EKG  EKG shows, sinus rhythm, rate 71, normal QRS, prolonged QTc, no obvious ischemic ST elevation, T wave flattening in 3, aVF, prolonged QTc is new compared to prior   RADIOLOGY On my independent interpretation, CT without obvious free air   PROCEDURES:  Critical Care performed: No  Procedures   MEDICATIONS ORDERED IN ED: Medications  0.9 %  sodium chloride  infusion (Manually program via Guardrails IV  Fluids) (has no administration in time range)  morphine  (PF) 2 MG/ML injection 2 mg (2 mg Intravenous Given 11/20/23 0843)  iohexol  (OMNIPAQUE ) 350 MG/ML injection 125 mL (125 mLs Intravenous Contrast Given 11/20/23 1030)  piperacillin -tazobactam (ZOSYN ) IVPB 3.375 g (3.375 g Intravenous New Bag/Given 11/20/23 1202)     IMPRESSION / MDM / ASSESSMENT AND PLAN / ED COURSE  I reviewed the triage vital signs and the nursing notes.                              Differential diagnosis includes, but is not limited to, GI bleed likely lower, medication side effect, mass, diverticulitis.  Will get labs, type and screen, CT angio GI bleed.  Given that patient is stable, no active bleeding from her rectum at this time, will hold off reversal for now and continue to monitor.  She will need to be admitted for further management.  Patient's presentation is most consistent with acute presentation with potential threat to life or bodily function.  Independent interpretation of labs and imaging below.  CT imaging shows diverticulitis with some active bleeding in this region.  She has been stable here, will start her on IV Zosyn .  Will hold her Eliquis .  Will have her admitted for further management.  Consult to hospitalist was agreeable with plan for admission and will evaluate the patient.  She is admitted.  Was notified by nursing that she checked on her diaper and noticed blood with clots in the diaper.  Reached out to IR and added hospitalist to the conversation.  The patient is on the cardiac monitor to evaluate for evidence of arrhythmia and/or significant heart rate changes.   Clinical Course as of 11/20/23 1234  Thu Nov 20, 2023  0912 Independent review of labs, INR is normal, electrolytes not severely deranged, creatinine is normal, she has a mild leukocytosis, hemoglobin is 11.7 this is in comparison to 12.2 from 2 days ago. [TT]  1124 CT ANGIO GI BLEED IMPRESSION: 1. Findings favor smoldering  diverticulitis at the junction of distal descending colon/proximal sigmoid colon, as described in detail above. There is active GI bleeding in this region, favored diverticular in origin. 2. Interval enlargement of multiple retroperitoneal and right upper quadrant mesenteric lymph nodes, as discussed in detail above. Attention  on follow-up examination is recommended. 3. 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   [TT]    Clinical Course User Index [TT] Waymond Lorelle Cummins, MD     FINAL CLINICAL IMPRESSION(S) / ED DIAGNOSES   Final diagnoses:  Gastrointestinal hemorrhage, unspecified gastrointestinal hemorrhage type  RLQ abdominal pain  Prolonged Q-T interval on ECG  Diverticulitis     Rx / DC Orders   ED Discharge Orders     None        Note:  This document was prepared using Dragon voice recognition software and may include unintentional dictation errors.    Waymond Lorelle Cummins, MD 11/20/23 [phone]

## 2023-11-20 NOTE — Progress Notes (Signed)
 ED Pharmacy Antibiotic Sign Off An antibiotic consult was received from an ED provider for Zosyn .  A chart review was completed to assess appropriateness.   The following one time order(s) were placed:  Zosyn  3.375   Further antibiotic and/or antibiotic pharmacy consults should be ordered by the admitting provider if indicated.   Thank you for allowing pharmacy to be a part of this patient's care.   Estill CHRISTELLA Lutes, [facility]  Clinical Pharmacist 11/20/23 11:53 AM

## 2023-11-20 NOTE — ED Notes (Addendum)
 Ths RN started first albumin , and messaged med-surge Mariama, RN and told her the second dose is hanging to be given when first is c/o. Mariama confirmed.

## 2023-11-20 NOTE — Consult Note (Signed)
 Chief Complaint: Patient was seen in consultation today for GI bleed, with consideration for embolization.  Referring Provider(s): Dr. Camila Ned, MD   Supervising Physician: Karalee Beat  Patient Status: [facility] - ED  Patient is Full Code  History of Present Illness: Jamie Phillips is a 83 y.o. female  with PMHx notable for CLL, history of prior PE on Eliquis , and other comorbidities as delineated below.  Patient was transported to ED this morning with rectal bleeding. Per EDP note this am:  Patient woke up, states that she felt like her bed was wet, notices blood.  Did take her Eliquis  last night.  She notes right lower quadrant abdominal pain.  Did have a bowel movement yesterday, patient states that she has chronic diarrhea and so the diarrhea yesterday was normal for her, nonbloody.  No nausea or vomiting.  No chest pain or shortness of breath, no recent trauma or falls.   Per independent history from EMS, she had moved from her bed to a chair but there was blood noted on the chair, no active bleeding for them, her vitals were stable. On independent review, she was seen by oncology at the end of July, has history of massive pulmonary embolism in 2017, she is status post thrombolysis as well as IVC filter explantation, was continued on Eliquis  but there was a note discussing consideration for decreasing Eliquis  during her next visit.  Also history of chronic CLL, continued surveillance.  CT Angio GI bleed: IMPRESSION: 1. Findings favor smoldering diverticulitis at the junction of distal descending colon/proximal sigmoid colon, as described in detail above. There is active GI bleeding in this region, favored diverticular in origin. 2. Interval enlargement of multiple retroperitoneal and right upper quadrant mesenteric lymph nodes, as discussed in detail above. Attention on follow-up examination is recommended. 3. 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  Interventional Radiology was requested for mesenteric angiogram with possible embolization. Request was reviewed and approved by Dr. Karalee. Patient is urgently scheduled for same in IR today.   Patient is alert and laying in bed, calm. Family at bedside. Patient is currently without any significant complaints. She endorses chronic back pain only at this time. Patient denies any fevers, headache, chest pain, SOB, cough, abdominal pain, nausea, vomiting or bleeding.     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)    Macular degeneration    MGUS (monoclonal gammopathy of unknown significance)    Morbid obesity (HCC)    Obesity    Pre-diabetes    Pulmonary embolism (HCC)    PVD (peripheral vascular disease) (HCC)    RLS (restless legs syndrome)    Sleep apnea    CPAP  Past Surgical History:  Procedure Laterality Date   APPENDECTOMY     BREAST BIOPSY Left 08/19/2004   lt bx/clip-neg   BREAST BIOPSY Left 03/05/2023   US  BX heart clip, path pending-   BREAST BIOPSY Left 03/05/2023   US  BX hydromark coil clip axilla node- path pending   BREAST BIOPSY Left 03/05/2023   US  LT BREAST BX W LOC DEV 1ST LESION IMG BX SPEC US  GUIDE 03/05/2023 [facility]   CATARACT EXTRACTION W/PHACO  Left 09/10/2022   Procedure: CATARACT EXTRACTION PHACO AND INTRAOCULAR LENS PLACEMENT (IOC) LEFT  5.88  00:31.9;  Surgeon: Jaye Fallow, MD;  Location: [facility];  Service: Ophthalmology;  Laterality: Left;  sleep apnea   COLONOSCOPY     COLONOSCOPY WITH PROPOFOL  N/A 03/10/2017   Procedure: COLONOSCOPY WITH PROPOFOL ;  Surgeon: Gaylyn Gladis PENNER, MD;  Location: [facility] ENDOSCOPY;  Service: Endoscopy;  Laterality: N/A;   DILATION AND CURETTAGE OF UTERUS     ESOPHAGOGASTRODUODENOSCOPY (EGD) WITH PROPOFOL  N/A 02/02/2019   Procedure: ESOPHAGOGASTRODUODENOSCOPY (EGD) WITH PROPOFOL ;  Surgeon: Toledo, Ladell POUR, MD;  Location: [facility] ENDOSCOPY;  Service: Endoscopy;  Laterality: N/A;   EYE SURGERY     IVC FILTER INSERTION N/A 06/11/2016   Procedure: IVC Filter Insertion;  Surgeon: Cordella KANDICE Shawl, MD;  Location: [facility] INVASIVE CV LAB;  Service: Cardiovascular;  Laterality: N/A;   IVC FILTER REMOVAL N/A 05/06/2017   Procedure: IVC FILTER REMOVAL;  Surgeon: Shawl Cordella KANDICE, MD;  Location: [facility] INVASIVE CV LAB;  Service: Cardiovascular;  Laterality: N/A;   JOINT REPLACEMENT Left 06/17/2016   Knees, pt stated left and right have been replaced.   LAPAROSCOPIC APPENDECTOMY N/A 10/10/2018   Procedure: APPENDECTOMY LAPAROSCOPIC ATTEMPTED CONVERTED TO OPEN;  Surgeon: Rodolph Romano, MD;  Location: [facility] ORS;  Service: General;  Laterality: N/A;   LAPAROSCOPY N/A 10/10/2018   Procedure: LAPAROSCOPY DIAGNOSTIC ATTEMPTED CONVERTED TO OPEN;  Surgeon: Rodolph Romano, MD;  Location: [facility] ORS;  Service: General;  Laterality: N/A;   LUMBAR LAMINECTOMY/DECOMPRESSION MICRODISCECTOMY N/A 07/09/2021   Procedure: Laminectomy and Foraminotomy - Lumbar Two-Lumbar Three - Lumbar Three-Lumbar Four - Lumbar Four-Lumbar Five;  Surgeon: Louis Shove, MD;  Location: [facility] OR;  Service: Neurosurgery;  Laterality: N/A;   neck fusion     OPEN REDUCTION INTERNAL FIXATION (ORIF) WRIST WITH ILIAC CREST BONE GRAFT  Left    PARTIAL COLECTOMY Right 10/10/2018   Procedure: PARTIAL COLECTOMY;  Surgeon: Rodolph Romano, MD;  Location: [facility] ORS;  Service: General;  Laterality: Right;   PERIPHERAL VASCULAR CATHETERIZATION N/A 08/15/2015   Procedure: pulmonary angiogram with lysis;  Surgeon: Cordella KANDICE Shawl, MD;  Location: [facility] INVASIVE CV LAB;  Service: Cardiovascular;  Laterality: N/A;   WRIST SURGERY Left 2000    Allergies: Patient has no known allergies.  Medications: Prior to Admission medications   Medication Sig Start Date End Date Taking? Authorizing Provider  acetaminophen  (TYLENOL ) 650 MG CR tablet Take 650-1,300 mg by mouth every 8 (eight) hours as needed for pain. TYLENOL  ARTHRITIS   Yes [provider]  apixaban  (ELIQUIS ) 5 MG TABS tablet Take 1 tablet (5 mg total) by mouth 2 (two) times daily. 09/28/21  Yes Brahmanday, Govinda R, MD  b complex vitamins capsule Take 1 capsule by mouth daily.   Yes [provider]  calcium  carbonate (OSCAL) 1500 (600 Ca) MG TABS tablet Take 1,500 mg by mouth 2 (two) times daily with a meal.   Yes [provider]  Cholecalciferol (VITAMIN D -3) 125 MCG (5000 UT) TABS Take by  mouth daily.   Yes [provider]  DULoxetine  (CYMBALTA ) 60 MG capsule Take 1 capsule (60 mg total) by mouth daily. With Other SSRI 03/31/23  Yes Lovorn, Megan, MD  escitalopram  (LEXAPRO ) 20 MG tablet Take 1 tablet by mouth daily. 05/20/22  Yes [provider]  gabapentin  (NEURONTIN ) 400 MG capsule Take 1 capsule (400 mg total) by mouth 4 (four) times daily. Take 1 pill in Am and 2 pills at night- for nerve pain 03/31/23  Yes Lovorn, Megan, MD  HYDROcodone -acetaminophen  (NORCO) 10-325 MG tablet Take 1 tablet by mouth every 6 (six) hours as needed. 09/03/23  Yes Debby Fidela CROME, NP  losartan -hydrochlorothiazide  (HYZAAR) 100-12.5 MG tablet Take 0.5 tablets by mouth daily. Reported on 08/22/2015 06/18/21  Yes Berkeley Adelita PENNER, MD  magnesium  gluconate  (MAGONATE) 500 MG tablet Take 500 mg by mouth 2 (two) times daily.   Yes [provider]  methocarbamol  (ROBAXIN ) 500 MG tablet TAKE 1 TABLET (500 MG TOTAL) EVERY 8 (EIGHT) HOURS AS NEEDED FOR MUSCLE SPASMS. 05/23/23  Yes Lovorn, Megan, MD  Multiple Vitamins-Minerals (PRESERVISION AREDS 2 PO) Take 1 tablet by mouth in the morning and at bedtime.   Yes [provider]  pantoprazole  (PROTONIX ) 40 MG tablet  10/25/23  Yes [provider]  pravastatin  (PRAVACHOL ) 40 MG tablet Take 1 tablet by mouth daily. 06/27/21  Yes [provider]  propranolol  (INDERAL ) 20 MG tablet Take 1 tablet by mouth 2 (two) times daily. 08/14/23  Yes [provider]  sucralfate  (CARAFATE ) 1 g tablet Take 1 g by mouth daily.   Yes [provider]  torsemide (DEMADEX) 20 MG tablet  07/29/23  Yes [provider]  TURMERIC CURCUMIN PO Take 1 capsule by mouth in the morning and at bedtime.   Yes [provider]  cholestyramine  (QUESTRAN ) 4 g packet Take 1 packet (4 g total) by mouth 3 (three) times daily with meals. 04/30/21   Berkeley Adelita PENNER, MD     Family History  Problem Relation Age of Onset   Pulmonary embolism Mother    Sudden death Mother    Obesity Mother    Heart failure Father    Congestive Heart Failure Father    Sudden death Father    Thyroid  cancer Brother    Breast cancer Neg Hx     Social History   Socioeconomic History   Marital status: Widowed    Spouse name: Not on file   Number of children: Not on file   Years of education: Not on file   Highest education level: Not on file  Occupational History   Occupation: Retired  Tobacco Use   Smoking status: Never   Smokeless tobacco: Never  Vaping Use   Vaping status: Never Used  Substance and Sexual Activity   Alcohol use: No    Alcohol/week: 0.0 standard drinks of alcohol   Drug use: No   Sexual activity: Not on file  Other Topics Concern   Not on file  Social History  Narrative   ** Merged History Encounter **       Social Drivers of Health   Financial Resource Strain: Low Risk  (01/13/2023)   Received from [facility]   Overall Financial Resource Strain (CARDIA)    Difficulty of Paying Living Expenses: Not hard at all  Food Insecurity: No Food Insecurity (01/13/2023)   Received from [facility]   Hunger Vital Sign    Within the past 12 months,  you worried that your food would run out before you got the money to buy more.: Never true    Within the past 12 months, the food you bought just didn't last and you didn't have money to get more.: Never true  Transportation Needs: No Transportation Needs (01/13/2023)   Received from [facility] - Transportation    In the past 12 months, has lack of transportation kept you from medical appointments or from getting medications?: No    Lack of Transportation (Non-Medical): No  Physical Activity: Unknown (03/18/2017)   Received from [facility]   Exercise Vital Sign    Days of Exercise per Week: Patient declined    Minutes of Exercise per Session: Patient declined  Stress: Unknown (03/18/2017)   Received from [facility] - Occupational Stress Questionnaire    Feeling of Stress : Patient declined  Social Connections: Unknown (03/18/2017)   Received from [facility]   Social Connection and Isolation Panel    Frequency of Communication with Friends and Family: Patient declined    Frequency of Social Gatherings with Friends and Family: Patient declined    Attends Religious Services: Patient declined    Active Member of Clubs or Organizations: Patient declined    Attends Banker Meetings: Patient declined    Marital Status: Patient declined     Review of Systems: A 12 point ROS discussed and pertinent positives are indicated in the HPI  above.  All other systems are negative.  Vital Signs: BP (!) 109/57   Pulse 67   Temp 97.8 F (36.6 C) (Oral)   Resp 17   Ht 5' 4 (1.626 m)   Wt 242 lb 4.8 oz (109.9 kg)   SpO2 100%   BMI 41.59 kg/m   Advance Care Plan: The advanced care place/surrogate decision maker was discussed at the time of visit and the patient did not wish to discuss or was not able to name a surrogate decision maker or provide an advance care plan.  Physical Exam Constitutional:      General: She is not in acute distress.    Appearance: Normal appearance. She is not ill-appearing, toxic-appearing or diaphoretic.  HENT:     Mouth/Throat:     Mouth: Mucous membranes are dry.  Cardiovascular:     Rate and Rhythm: Normal rate and regular rhythm.     Pulses: Normal pulses.     Heart sounds: Normal heart sounds.  Pulmonary:     Effort: Pulmonary effort is normal.     Breath sounds: Rhonchi and rales present.  Abdominal:     General: Abdomen is flat.     Palpations: Abdomen is soft.     Tenderness: There is no abdominal tenderness.  Musculoskeletal:        General: Normal range of motion.     Cervical back: Normal range of motion.  Skin:    General: Skin is warm and dry.  Neurological:     Mental Status: She is alert and oriented to person, place, and time.  Psychiatric:        Mood and Affect: Mood normal.        Behavior: Behavior normal.        Thought Content: Thought content normal.        Judgment: Judgment normal.     Imaging: DG Chest Port 1 View Result Date: 11/20/2023 CLINICAL DATA:  83 year old female with gastrointestinal bleeding. EXAM: PORTABLE CHEST 1 VIEW COMPARISON:  CTA Abdomen and Pelvis this morning reported separately. Chest radiographs 02/13/2021. FINDINGS: Portable AP semi upright view at 1239 hours. Lower lung volumes since 2022. Stable cardiac size and mediastinal contours. Allowing for portable technique the lungs are clear. No pneumothorax or pleural effusion. Paucity  of bowel gas. Chronic cervical ACDF. No acute osseous abnormality identified. IMPRESSION: Low lung volumes, otherwise no acute cardiopulmonary abnormality. Electronically Signed   By: VEAR Hurst M.D.   On: 11/20/2023 12:46   CT ANGIO GI BLEED Result Date: 11/20/2023 CLINICAL DATA:  rectal bleeding, RLQ pain. EXAM: CTA ABDOMEN AND PELVIS WITHOUT AND WITH CONTRAST TECHNIQUE: Multidetector CT imaging of the abdomen and pelvis was performed using the standard protocol during bolus administration of intravenous contrast. Multiplanar reconstructed images and MIPs were obtained and reviewed to evaluate the vascular anatomy. RADIATION DOSE REDUCTION: This exam was performed according to the departmental dose-optimization program which includes automated exposure control, adjustment of the mA and/or kV according to patient size and/or use of iterative reconstruction technique. CONTRAST:  OMNIPAQUE  IOHEXOL  350 MG/ML SOLN COMPARISON:  CT scan abdomen and pelvis from 10/14/2018. FINDINGS: VASCULAR Aorta: Normal caliber aorta without aneurysm, dissection, vasculitis or significant stenosis. Celiac: Patent without evidence of aneurysm, dissection, vasculitis or significant stenosis. SMA: Patent without evidence of aneurysm, dissection, vasculitis or significant stenosis. Renals: Both renal arteries are patent without evidence of aneurysm, dissection, vasculitis, fibromuscular dysplasia or significant stenosis. IMA: Patent without evidence of aneurysm, dissection, vasculitis or significant stenosis. Inflow: Patent without evidence of aneurysm, dissection, vasculitis or significant stenosis. Proximal Outflow: Bilateral common femoral and visualized portions of the superficial and profunda femoral arteries are patent without evidence of aneurysm, dissection, vasculitis or significant stenosis. Veins: No obvious venous abnormality within the limitations of this arterial phase study. Review of the MIP images confirms the above  findings. NON-VASCULAR Lower chest: There are patchy atelectatic changes in the visualized lung bases. No overt consolidation. No pleural effusion. The heart is normal in size. No pericardial effusion. Hepatobiliary: The liver is normal in size. Non-cirrhotic configuration. No suspicious mass. No intrahepatic or extrahepatic bile duct dilation. Moderate-to-large volume gallstones noted without imaging signs of acute cholecystitis. Normal gallbladder wall thickness. No pericholecystic inflammatory changes. 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 Otherwise unremarkable pancreas. No pancreatic ductal dilatation or surrounding inflammatory changes. Spleen: Within normal limits. No focal lesion. Adrenals/Urinary Tract: Adrenal glands are unremarkable. No suspicious renal mass. There is a predominantly exophytic cyst arising from the right kidney lower pole, laterally measuring 3.4 x 4.0 cm, which has significantly increased in size since the prior study however, no interval development of aggressive features. No routine follow-up is recommended. No nephroureterolithiasis or obstructive uropathy bladder wall trabeculations and pseudo diverticula noted, suggesting sequela of chronic urinary outflow obstruction. Stomach/Bowel: Postsurgical changes from prior right hemicolectomy noted. Ileocolonic anastomosis noted in the right upper quadrant, anteriorly. No disproportionate dilation of the small or large bowel loops. Liquid unformed stool noted in the left hemicolon. There are multiple diverticula throughout the colon there is a small grouping of diverticula in the distal descending colon/proximal sigmoid colon which exhibit mild surrounding fat stranding without obvious wall thickening, favoring probable smoldering diverticulitis. In this region (as seen on series 26, images 131-134 and series 14, images 52-57), there is 3 centric hyperattenuating area seen on  the arterial phase images, which increases in size/volume on the  portal venous phase images, compatible with active GI bleeding, likely related to diverticulitis. There are additional, 2 well-circumscribed hyperattenuating foci in the distal sigmoid colon (series 4, image 68), which changes position on different phases of exam and are favored to represent stool material. Vascular/Lymphatic: No ascites or pneumoperitoneum. There are multiple mildly enlarged retroperitoneal and right upper quadrant mesenteric lymph nodes which have mildly increased in size since the prior study from 2020. These are indeterminate in etiology but favored benign/reactive in the given clinical setting. Attention on follow-up examination is recommended. Reproductive: Not well evaluated on the current exam. However, note is made of a lobulated uterus. There is an approximately 3.0 x 3.4 cm lesion in the right side of the fundus of the uterus, favored to represent a leiomyoma. There also additional several calcified leiomyomas. There is a well-circumscribed oval 2.6 x 3.2 cm fluid attenuation structure in the left adnexa, favored to represent ovarian in etiology. This is favored to represent senescent ovarian cyst considering, it is essentially unchanged since the prior study from 2020; however, remains incompletely characterized. No large right adnexal mass seen. Other: Redemonstration of moderate sized midline/left paramedian periumbilical ventral hernia containing short loop of nondilated transverse colon and fat. The soft tissues and abdominal wall are otherwise unremarkable. Musculoskeletal: No suspicious osseous lesions. There are moderate multilevel degenerative changes in the visualized spine. IMPRESSION: 1. Findings favor smoldering diverticulitis at the junction of distal descending colon/proximal sigmoid colon, as described in detail above. There is active GI bleeding in this region, favored diverticular in origin. 2. Interval  enlargement of multiple retroperitoneal and right upper quadrant mesenteric lymph nodes, as discussed in detail above. Attention on follow-up examination is recommended. 3. 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 Electronically Signed   By: Ree Molt M.D.   On: 11/20/2023 10:54    Labs:  CBC: Recent Labs    11/18/23 1012 11/20/23 0826 11/20/23 1153 11/20/23 1557  WBC 12.2* 15.0* 14.1*  --   HGB 12.2 11.7* 10.8* 10.9*  HCT 38.8 37.7 35.4*  --   PLT 217 263 219  --     COAGS: Recent Labs    11/20/23 0826 11/20/23 1557  INR 1.2  --   APTT  --  33    BMP: Recent Labs    11/18/23 1012 11/20/23 0826  NA 138 142  K 4.1 4.5  CL 100 104  CO2 31 27  GLUCOSE 77 123*  BUN 28* 24*  CALCIUM  9.0 8.7*  CREATININE 1.12* 0.90  GFRNONAA 49* >60    LIVER FUNCTION TESTS: Recent Labs    11/18/23 1012 11/20/23 0826  BILITOT 0.6 0.4  AST 14* 13*  ALT 11 10  ALKPHOS 86 73  PROT 6.0* 5.3*  ALBUMIN  3.5 3.0*    TUMOR MARKERS: No results for input(s): AFPTM, CEA, CA199, CHROMGRNA in the last 8760 hours.  Assessment and Plan: Patient was noted with soft BP and pallor, now receiving one unit PRBC in ED. Updated labs were drawn recently and pending. Patient appears stable on examination.   Patient presents for urgently scheduled mesenteric angiogram with possible embolization in IR today.  Patient has been NPO since 6pm yesterday, sips of water with meds in ED not withstanding. All labs and medications are within acceptable parameters. Last dose Eliquis  last night. Currently being held. No pertinent allergies.  The Risks and benefits of embolization were discussed with the patient including, but not limited to bleeding, infection, vascular  injury, post operative pain, or contrast induced renal failure.  This procedure involves the use of X-rays and because of the nature of the planned procedure, it is possible that we will have prolonged use of X-ray fluoroscopy.  Potential radiation risks to you include (but are not limited to) the following: - A slightly elevated risk for cancer several years later in life. This risk is typically less than 0.5% percent. This risk is low in comparison to the normal incidence of human cancer, which is 33% for women and 50% for men according to the [employer] Society. - Radiation induced injury can include skin redness, resembling a rash, tissue breakdown / ulcers and hair loss (which can be temporary or permanent).   The likelihood of either of these occurring depends on the difficulty of the procedure and whether you are sensitive to radiation due to previous procedures, disease, or genetic conditions.   IF your procedure requires a prolonged use of radiation, you will be notified and given written instructions for further action.  It is your responsibility to monitor the irradiated area for the 2 weeks following the procedure and to notify your physician if you are concerned that you have suffered a radiation induced injury.    All of the patient's questions were answered, patient is agreeable to proceed.  Consent signed and in chart.      Thank you for allowing our service to participate in Jamie Phillips 's care.  Electronically Signed: Carlin DELENA Griffon, PA-C   11/20/2023, 4:29 PM      I spent a total of 40 Minutes in face to face in clinical consultation, greater than 50% of which was counseling/coordinating care for GI bleed, with consideration for embolization.

## 2023-11-21 DIAGNOSIS — K922 Gastrointestinal hemorrhage, unspecified: Secondary | ICD-10-CM | POA: Diagnosis not present

## 2023-11-21 LAB — MULTIPLE MYELOMA PANEL, SERUM
Albumin SerPl Elph-Mcnc: 3.2 g/dL (ref 2.9–4.4)
Albumin/Glob SerPl: 1.3 (ref 0.7–1.7)
Alpha 1: 0.2 g/dL (ref 0.0–0.4)
Alpha2 Glob SerPl Elph-Mcnc: 0.8 g/dL (ref 0.4–1.0)
B-Globulin SerPl Elph-Mcnc: 1.2 g/dL (ref 0.7–1.3)
Gamma Glob SerPl Elph-Mcnc: 0.4 g/dL (ref 0.4–1.8)
Globulin, Total: 2.6 g/dL (ref 2.2–3.9)
IgA: 385 mg/dL (ref 64–422)
IgG (Immunoglobin G), Serum: 426 mg/dL — ABNORMAL LOW (ref 586–1602)
IgM (Immunoglobulin M), Srm: 47 mg/dL (ref 26–217)
Total Protein ELP: 5.8 g/dL — ABNORMAL LOW (ref 6.0–8.5)

## 2023-11-21 LAB — COMPREHENSIVE METABOLIC PANEL WITH GFR
ALT: 7 U/L (ref 0–44)
AST: 11 U/L — ABNORMAL LOW (ref 15–41)
Albumin: 3.1 g/dL — ABNORMAL LOW (ref 3.5–5.0)
Alkaline Phosphatase: 52 U/L (ref 38–126)
Anion gap: 7 (ref 5–15)
BUN: 19 mg/dL (ref 8–23)
CO2: 27 mmol/L (ref 22–32)
Calcium: 8.2 mg/dL — ABNORMAL LOW (ref 8.9–10.3)
Chloride: 108 mmol/L (ref 98–111)
Creatinine, Ser: 0.85 mg/dL (ref 0.44–1.00)
GFR, Estimated: >60 mL/min (ref >60)
Glucose, Bld: 97 mg/dL (ref 70–99)
Potassium: 3.7 mmol/L (ref 3.5–5.1)
Sodium: 142 mmol/L (ref 135–145)
Total Bilirubin: 0.6 mg/dL (ref 0.0–1.2)
Total Protein: 4.7 g/dL — ABNORMAL LOW (ref 6.5–8.1)

## 2023-11-21 LAB — BPAM RBC
Blood Product Expiration Date: 202508292359
ISSUE DATE / TIME: 202507311323
Unit Type and Rh: 6200

## 2023-11-21 LAB — TYPE AND SCREEN
ABO/RH(D): A POS
Antibody Screen: NEGATIVE
Unit division: 0

## 2023-11-21 LAB — CBC
HCT: 26.2 % — ABNORMAL LOW (ref 36.0–46.0)
Hemoglobin: 8.2 g/dL — ABNORMAL LOW (ref 12.0–15.0)
MCH: 30.5 pg (ref 26.0–34.0)
MCHC: 31.3 g/dL (ref 30.0–36.0)
MCV: 97.4 fL (ref 80.0–100.0)
Platelets: 179 K/uL (ref 150–400)
RBC: 2.69 MIL/uL — ABNORMAL LOW (ref 3.87–5.11)
RDW: 14.9 % (ref 11.5–15.5)
WBC: 14.2 K/uL — ABNORMAL HIGH (ref 4.0–10.5)
nRBC: 0 % (ref 0.0–0.2)

## 2023-11-21 LAB — HEMOGLOBIN: Hemoglobin: 8 g/dL — ABNORMAL LOW (ref 12.0–15.0)

## 2023-11-21 NOTE — Progress Notes (Signed)
 Patient arrieved from [employer]. Alert and oriented x 4, able to make her needs known. Right groin dressing CDI. Patients daughter and son in law at bedside. No questions or concerns at this time. Personal items with in reach, call light with in reach.

## 2023-11-21 NOTE — Progress Notes (Signed)
 Referring Provider(s): Dr. Camila Ned  Supervising Physician: Karalee Beat  Patient Status:  [facility] - In-pt  Chief Complaint: GI bleed s/p embolization seen for follow up   Brief History:  83 year old female with a history of CLL and PE, on Eliquis , who presented to the ED after noting bright red blood per rectum. She reported feeling week. In the ED she was noted to have continued episodes of bleeding and was mildly hypotensive. CTA revealed active GI bleeding in the sigmoid colon. H/H was noted to be downtrending throughout the day. IR consulted and patient underwent angiogram with embolization on 7/31.  Subjective:  Patient continues to have bright red blood per rectum. She denies any worsening abdominal pain, shortness of breath, dizziness/lightheadedness.   Allergies: Patient has no known allergies.  Medications: Prior to Admission medications   Medication Sig Start Date End Date Taking? Authorizing Provider  acetaminophen  (TYLENOL ) 650 MG CR tablet Take 650-1,300 mg by mouth every 8 (eight) hours as needed for pain. TYLENOL  ARTHRITIS   Yes [provider]  apixaban  (ELIQUIS ) 5 MG TABS tablet Take 1 tablet (5 mg total) by mouth 2 (two) times daily. 09/28/21  Yes Brahmanday, Govinda R, MD  b complex vitamins capsule Take 1 capsule by mouth daily.   Yes [provider]  calcium  carbonate (OSCAL) 1500 (600 Ca) MG TABS tablet Take 1,500 mg by mouth 2 (two) times daily with a meal.   Yes [provider]  Cholecalciferol (VITAMIN D -3) 125 MCG (5000 UT) TABS Take by mouth daily.   Yes [provider]  DULoxetine  (CYMBALTA ) 60 MG capsule Take 1 capsule (60 mg total) by mouth daily. With Other SSRI 03/31/23  Yes Lovorn, Megan, MD  escitalopram  (LEXAPRO ) 20 MG tablet Take 1 tablet by mouth daily. 05/20/22  Yes [provider]  gabapentin  (NEURONTIN ) 400 MG capsule Take 1 capsule (400 mg total) by mouth 4 (four) times daily. Take 1 pill  in Am and 2 pills at night- for nerve pain 03/31/23  Yes Lovorn, Megan, MD  HYDROcodone -acetaminophen  (NORCO) 10-325 MG tablet Take 1 tablet by mouth every 6 (six) hours as needed. 09/03/23  Yes Ned Fidela CROME, NP  losartan -hydrochlorothiazide  (HYZAAR) 100-12.5 MG tablet Take 0.5 tablets by mouth daily. Reported on 08/22/2015 06/18/21  Yes Berkeley Adelita PENNER, MD  magnesium  gluconate (MAGONATE) 500 MG tablet Take 500 mg by mouth 2 (two) times daily.   Yes [provider]  methocarbamol  (ROBAXIN ) 500 MG tablet TAKE 1 TABLET (500 MG TOTAL) EVERY 8 (EIGHT) HOURS AS NEEDED FOR MUSCLE SPASMS. 05/23/23  Yes Lovorn, Megan, MD  Multiple Vitamins-Minerals (PRESERVISION AREDS 2 PO) Take 1 tablet by mouth in the morning and at bedtime.   Yes [provider]  pantoprazole  (PROTONIX ) 40 MG tablet  10/25/23  Yes [provider]  pravastatin  (PRAVACHOL ) 40 MG tablet Take 1 tablet by mouth daily. 06/27/21  Yes [provider]  propranolol  (INDERAL ) 20 MG tablet Take 1 tablet by mouth 2 (two) times daily. 08/14/23  Yes [provider]  sucralfate  (CARAFATE ) 1 g tablet Take 1 g by mouth daily.   Yes [provider]  torsemide (DEMADEX) 20 MG tablet  07/29/23  Yes [provider]  TURMERIC CURCUMIN PO Take 1 capsule by mouth in the morning and at bedtime.   Yes [provider]  cholestyramine  (QUESTRAN ) 4 g packet Take 1 packet (4 g total) by mouth 3 (three) times daily with meals. 04/30/21   Berkeley Adelita PENNER,  MD     Vital Signs: BP (!) 105/59 (BP Location: Right Arm)   Pulse 74   Temp 98.1 F (36.7 C)   Resp 16   Ht 5' 4 (1.626 m)   Wt 242 lb 4.8 oz (109.9 kg)   SpO2 94%   BMI 41.59 kg/m   Physical Exam Vitals reviewed.  Constitutional:      Appearance: She is obese.  Cardiovascular:     Rate and Rhythm: Normal rate and regular rhythm.     Pulses: Normal pulses.     Comments: Femoral puncture site with no signs of hematoma  Pulmonary:      Effort: Pulmonary effort is normal.     Breath sounds: Normal breath sounds.  Abdominal:     General: Abdomen is flat.     Palpations: Abdomen is soft.     Tenderness: There is abdominal tenderness (bilateral lower quadrants).  Skin:    General: Skin is warm and dry.  Neurological:     Mental Status: She is alert and oriented to person, place, and time.  Psychiatric:        Judgment: Judgment normal.      Labs:  CBC: Recent Labs    11/18/23 1012 11/20/23 0826 11/20/23 0826 11/20/23 1153 11/20/23 1557 11/20/23 2233 11/21/23 0458 11/21/23 1047  WBC 12.2* 15.0*  --  14.1*  --   --  14.2*  --   HGB 12.2 11.7*   < > 10.8* 10.9* 9.3* 8.2* 8.0*  HCT 38.8 37.7  --  35.4*  --   --  26.2*  --   PLT 217 263  --  219  --   --  179  --    < > = values in this interval not displayed.    COAGS: Recent Labs    11/20/23 0826 11/20/23 1557  INR 1.2  --   APTT  --  33    BMP: Recent Labs    11/18/23 1012 11/20/23 0826 11/21/23 0458  NA 138 142 142  K 4.1 4.5 3.7  CL 100 104 108  CO2 31 27 27   GLUCOSE 77 123* 97  BUN 28* 24* 19  CALCIUM  9.0 8.7* 8.2*  CREATININE 1.12* 0.90 0.85  GFRNONAA 49* >60 >60    LIVER FUNCTION TESTS: Recent Labs    11/18/23 1012 11/20/23 0826 11/21/23 0458  BILITOT 0.6 0.4 0.6  AST 14* 13* 11*  ALT 11 10 7   ALKPHOS 86 73 52  PROT 6.0* 5.3* 4.7*  ALBUMIN  3.5 3.0* 3.1*    Assessment and Plan:  Lower GI bleed s/p embolization: Jamie Phillips is a 83 y.o. female who underwent embolization for sigmoid colon bleed on 7/31.   -Patient without worsening abdominal pain -Denies any dizziness, palpitations, shortness of breath -H/H 8.0 this morning; continue to monitor -Continues to have BRBPR -Patient on Eliquis  prior to arrival at hospital; optimistic that bleed will resolve with adequate washout period -Remains hypotensive -IR will continue to follow   Thank you for allowing our service to participate in Jamie Phillips 's  care.   Electronically Signed: Tyleigh Mahn M Anjel Pardo, PA-C 11/21/2023, 3:56 PM    I spent a total of 15 Minutes at the the patient's bedside AND on the patient's hospital floor or unit, greater than 50% of which was counseling/coordinating care for GI bleed post embolization.

## 2023-11-21 NOTE — Progress Notes (Signed)
 Progress Note   Patient: Jamie Phillips FMW:[id number] DOB: Nov 16, 1940 DOA: 11/20/2023     1 DOS: the patient was seen and examined on 11/21/2023   Brief hospital course:  83 y.o. 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  Patient also notes associated abdominal pain with passing clots. Patient notes loose stools yesterday but no blood or abdominal pain. She denies fever/chills/ n/v/ dysuria,sob/ or chest pain. She does endorse feeling week. She notes she has has 3 episodes of bleeding since being in ED.  She notes she last took her  Eliquis  last evening.  ED Course:  Patient on evaluation in ED was found to have stable vitals with CT bleed scan noting diverticulitis and active gi bleed. Patient hgb was noted to have 1 point drop. Patient was referred for admission as well as to IR for possible intervention.    Assessment and Plan:  Lower Gi bleed  :- noted to have diverticular bleed active on ct bleed  s/p Angiogram and embolization of small active bleed in the sigmoid colon   -patient on Eliquis  : last dose last pmh  - noted relative hypotension and drop hgb 1 gram with episode of rebleed in ED -transfuse 1 unit PRBC as patient is symptomatic with ongoing bleed -monitor h/h transfuse if < 7 or patient with active bleed and symptomatic  -await Gi recs re OAC and when safe to resume    Acute diverticulitis  -continue with zosyn     B/L Massive Pulmonary Emboli/R LE DVT on Eliquis  -insetting of  CLL/SLL  -if h/h continue to drop despite support with PRBC will consider IR embolization if rebleed,per IR good available targets -other wise will hold Eliquis   -once h/h stable will start heparin  low dose w/o bolus     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  -no active issues     OSA   -resume CPAP   HLD -resume oral medication once tolerating po     GERD -ppi   Pre diabetes -monitor poc     DjD of lumbar spine -supportive care     DVT prophylaxis: SCD Code Status: full/ as discussed per patient wishes in event of cardiac arrest      Subjective: Patient seen and examined this morning.  No overnight events.  Symptomatically feeling better with mild abdominal discomfort.  Physical Exam: Vitals:   11/20/23 1930 11/20/23 1954 11/21/23 0055 11/21/23 0432  BP: 116/61 126/76 (!) 115/47 (!) 115/55  Pulse: (!) 59 (!) 58 (!) 59 64  Resp: 19 20 18 17   Temp: 97.8 F (36.6 C) 98 F (36.7 C) 98 F (36.7 C) 98.1 F (36.7 C)  TempSrc: Temporal Oral    SpO2: 92% 99% 97% 96%  Weight:      Height:       Physical Exam HENT:     Head: Normocephalic.     Mouth/Throat:     Mouth: Mucous membranes are moist.  Eyes:     Pupils: Pupils are equal, round, and reactive to light.  Cardiovascular:     Rate and Rhythm: Normal rate.  Pulmonary:     Effort: Pulmonary effort is normal.  Abdominal:     Palpations: Abdomen is soft.     Tenderness: There is abdominal tenderness.  Musculoskeletal:  General: Normal range of motion.     Cervical back: Normal range of motion.  Skin:    General: Skin is warm.  Neurological:     General: No focal deficit present.     Mental Status: She is alert and oriented to person, place, and time. Mental status is at baseline.     Data Reviewed:  There are no new results to review at this time.  Family Communication: None by bedside  Disposition: Status is: Inpatient Remains inpatient appropriate because: GI bleed  Planned Discharge Destination: Home    Time spent: 35 minutes  Author: Albina Sor, MD 11/21/2023 10:19 AM  For on call review [email or web address].

## 2023-11-21 NOTE — Plan of Care (Signed)
  Problem: Education: Goal: Understanding of CV disease, CV risk reduction, and recovery process will improve Outcome: Progressing   Problem: Education: Goal: Individualized Educational Video(s) Outcome: Progressing   Problem: Cardiovascular: Goal: Ability to achieve and maintain adequate cardiovascular perfusion will improve Outcome: Progressing   Problem: Cardiovascular: Goal: Vascular access site(s) Level 0-1 will be maintained Outcome: Progressing   Problem: Education: Goal: Knowledge of General Education information will improve Description: Including pain rating scale, medication(s)/side effects and non-pharmacologic comfort measures Outcome: Progressing

## 2023-11-21 NOTE — Progress Notes (Signed)
 Transition of Care [facility]) - Inpatient Brief Assessment   Patient Details  Name: Jamie Phillips MRN: [id number] Date of Birth: 1941-01-17  [facility]) CM/SW Contact:    Prentis Langdon C Edgerrin Correia, RN Phone Number: 11/21/2023, 3:29 PM   Clinical Narrative: TOC continuing to follow patient's progress throughout discharge planning.   Transition of Care Asessment: Insurance and Status: Insurance coverage has been reviewed Patient has primary care physician: Yes   Prior level of function:: Independent Prior/Current Home Services: No current home services Social Drivers of Health Review: SDOH reviewed no interventions necessary Readmission risk has been reviewed: Yes Transition of care needs: no transition of care needs at this time

## 2023-11-22 DIAGNOSIS — K922 Gastrointestinal hemorrhage, unspecified: Secondary | ICD-10-CM | POA: Diagnosis not present

## 2023-11-22 LAB — VITAMIN B12: Vitamin B-12: 469 pg/mL (ref 180–914)

## 2023-11-22 LAB — COMPREHENSIVE METABOLIC PANEL WITH GFR
ALT: 11 U/L (ref 0–44)
AST: 19 U/L (ref 15–41)
Albumin: 3.1 g/dL — ABNORMAL LOW (ref 3.5–5.0)
Alkaline Phosphatase: 57 U/L (ref 38–126)
Anion gap: 5 (ref 5–15)
BUN: 13 mg/dL (ref 8–23)
CO2: 30 mmol/L (ref 22–32)
Calcium: 8.7 mg/dL — ABNORMAL LOW (ref 8.9–10.3)
Chloride: 111 mmol/L (ref 98–111)
Creatinine, Ser: 0.96 mg/dL (ref 0.44–1.00)
GFR, Estimated: 59 mL/min — ABNORMAL LOW (ref >60)
Glucose, Bld: 111 mg/dL — ABNORMAL HIGH (ref 70–99)
Potassium: 4 mmol/L (ref 3.5–5.1)
Sodium: 146 mmol/L — ABNORMAL HIGH (ref 135–145)
Total Bilirubin: 0.4 mg/dL (ref 0.0–1.2)
Total Protein: 4.8 g/dL — ABNORMAL LOW (ref 6.5–8.1)

## 2023-11-22 LAB — IRON AND TIBC
Iron: 31 ug/dL (ref 28–170)
Saturation Ratios: 11 % (ref 10.4–31.8)
TIBC: 291 ug/dL (ref 250–450)
UIBC: 260 ug/dL

## 2023-11-22 LAB — CBC
HCT: 26.6 % — ABNORMAL LOW (ref 36.0–46.0)
Hemoglobin: 8.4 g/dL — ABNORMAL LOW (ref 12.0–15.0)
MCH: 31 pg (ref 26.0–34.0)
MCHC: 31.6 g/dL (ref 30.0–36.0)
MCV: 98.2 fL (ref 80.0–100.0)
Platelets: 173 K/uL (ref 150–400)
RBC: 2.71 MIL/uL — ABNORMAL LOW (ref 3.87–5.11)
RDW: 15 % (ref 11.5–15.5)
WBC: 12.6 K/uL — ABNORMAL HIGH (ref 4.0–10.5)
nRBC: 0 % (ref 0.0–0.2)

## 2023-11-22 LAB — VITAMIN D 25 HYDROXY (VIT D DEFICIENCY, FRACTURES): Vit D, 25-Hydroxy: 35.46 ng/mL (ref 30–100)

## 2023-11-22 LAB — FOLATE: Folate: 21.8 ng/mL (ref >5.9)

## 2023-11-22 LAB — HEMOGLOBIN AND HEMATOCRIT, BLOOD
HCT: 26.1 % — ABNORMAL LOW (ref 36.0–46.0)
Hemoglobin: 8.5 g/dL — ABNORMAL LOW (ref 12.0–15.0)

## 2023-11-22 LAB — GLUCOSE, CAPILLARY: Glucose-Capillary: 160 mg/dL — ABNORMAL HIGH (ref 70–99)

## 2023-11-22 LAB — PHOSPHORUS: Phosphorus: 3.4 mg/dL (ref 2.5–4.6)

## 2023-11-22 LAB — MAGNESIUM: Magnesium: 2.2 mg/dL (ref 1.7–2.4)

## 2023-11-22 MED ORDER — ACETAMINOPHEN 325 MG PO TABS
650.0000 mg | ORAL_TABLET | Freq: Four times a day (QID) | ORAL | Status: DC | PRN
  Administered 2023-11-23: 650 mg via ORAL
  Filled 2023-11-22: qty 2

## 2023-11-22 MED ORDER — PANTOPRAZOLE SODIUM 40 MG PO TBEC
40.0000 mg | DELAYED_RELEASE_TABLET | Freq: Every day | ORAL | Status: DC
  Administered 2023-11-22 – 2023-11-28 (×7): 40 mg via ORAL
  Filled 2023-11-22 (×7): qty 1

## 2023-11-22 NOTE — Plan of Care (Signed)

## 2023-11-22 NOTE — Care Management Important Message (Signed)
 Important Message  Patient Details  Name: Jamie Phillips MRN: [id number] Date of Birth: Mar 19, 1941   Important Message Given:  Yes - Medicare IM     Rojelio SHAUNNA Rattler 11/22/2023, 1:39 PM

## 2023-11-22 NOTE — Progress Notes (Signed)
 [facility] Progress Note  Patient: Jamie Phillips    FMW:[id number]  DOA: 11/20/2023     Date of Service: the patient was seen and examined on 11/22/2023  Chief Complaint  Patient presents with   Rectal Bleeding   Brief hospital course: 83 y.o. 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  Patient also notes associated abdominal pain with passing clots. Patient notes loose stools yesterday but no blood or abdominal pain. She denies fever/chills/ n/v/ dysuria,sob/ or chest pain. She does endorse feeling week. She notes she has has 3 episodes of bleeding since being in ED.  She notes she last took her  Eliquis  last evening.   ED Course:  Patient on evaluation in ED was found to have stable vitals with CT bleed scan noting diverticulitis and active gi bleed. Patient hgb was noted to have 1 point drop. Patient was referred for admission as well as to IR for possible intervention.  7/31 s/p embolectomy done by IR Further management as below.  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  # Hypocalcemia due to hypoalbuminemia, vitamin D  level within normal range   Body mass index is 41.59 kg/m.  Interventions:  Diet: CLD >> FLD DVT Prophylaxis: SCDs  Advance goals of care discussion: Full code  Family Communication: family was present at bedside, at the time of interview.  The pt provided permission to discuss medical plan with the family. Opportunity was given to ask question and all questions were answered satisfactorily.   Disposition:  Pt is from Home, admitted with acute lower GI bleed, still has risk of bleeding, on FLD, which precludes a safe discharge. Discharge to home, when stable, may need few days to improve.  Subjective: No significant events overnight, patient had 1 BM last night which was dark, no bright red bleeding.  Slept less last night, abdominal pain mostly in the right lower quadrant.  Denied any other complaints.  Physical Exam: General: NAD, lying comfortably Appear in no distress, affect appropriate Eyes: PERRLA ENT: Oral Mucosa Clear, moist  Neck: no JVD,  Cardiovascular: S1 and S2 Present, no Murmur,  Respiratory: good respiratory effort, Bilateral Air entry equal and Decreased, no Crackles, no wheezes Abdomen: BS present, Soft and RLQ tenderness,  Skin: no rashes Extremities: no Pedal edema, no calf tenderness Neurologic: without any new focal findings Gait not checked due to patient safety concerns  Vitals:   11/22/23 0323 11/22/23 0829 11/22/23 1222 11/22/23 1535  BP: (!) 119/57 (!) 121/52 133/82 115/64  Pulse: 71 66 63 65  Resp: 18 16    Temp: 98.2 F (36.8 C) 98.5 F (36.9 C) 98.7 F (37.1 C) 98.4 F  (36.9 C)  TempSrc:      SpO2: 93% 94% 92% 90%  Weight:      Height:        Intake/Output Summary (Last 24 hours) at 11/22/2023 1754 Last data filed at 11/22/2023 1538 Gross per 24 hour  Intake 120 ml  Output 1000 ml  Net -880 ml   Filed Weights   11/20/23 0832  Weight: 109.9 kg    Data Reviewed: I have personally reviewed and interpreted daily labs, tele strips, imagings as discussed above. I reviewed all nursing notes, pharmacy notes, vitals, pertinent old records I have discussed plan of care as described above with RN and patient/family.  CBC: Recent Labs  Lab 11/18/23 1012 11/18/23 1012 11/20/23 0826 11/20/23 1153 11/20/23 1557 11/20/23 2233 11/21/23 0458 11/21/23 1047 11/22/23 0634 11/22/23 1708  WBC 12.2*  --  15.0* 14.1*  --   --  14.2*  --  12.6*  --   NEUTROABS 4.8  --  3.5 5.2  --   --   --   --   --   --   HGB 12.2   < > 11.7* 10.8*   < > 9.3* 8.2* 8.0* 8.4* 8.5*  HCT 38.8  --  37.7 35.4*  --   --  26.2*  --  26.6* 26.1*  MCV 96.8  --  97.7 98.9  --   --  97.4  --  98.2  --   PLT 217  --  263 219  --   --  179  --  173  --    < > = values in this interval not displayed.   Basic Metabolic Panel: Recent Labs  Lab 11/18/23 1012 11/20/23 0826 11/21/23 0458 11/22/23 0634 11/22/23 0935  NA 138 142 142 146*  --   K 4.1 4.5 3.7 4.0  --   CL 100 104 108 111  --   CO2 31 27 27 30   --   GLUCOSE 77 123* 97 111*  --   BUN 28* 24* 19 13  --   CREATININE 1.12* 0.90 0.85 0.96  --   CALCIUM  9.0 8.7* 8.2* 8.7*  --   MG  --   --   --   --  2.2  PHOS  --   --   --   --  3.4    Studies: No results found.  Scheduled Meds:  gabapentin   400 mg Oral QID   sucralfate   1 g Oral Daily   Continuous Infusions:  piperacillin -tazobactam (ZOSYN )  IV 3.375 g (11/22/23 1237)   PRN Meds: albuterol , fentaNYL  (SUBLIMAZE ) injection, methocarbamol   Time spent: 35 minutes  Author: ELVAN SOR. MD [facility] 11/22/2023 5:54 PM  To reach On-call, see care teams  to locate the attending and reach out to them via [email or web address]. If 7PM-7AM, please contact night-coverage If you still have difficulty  reaching the attending provider, please page the [facility] (Director on Call) for [facility] on amion for assistance.

## 2023-11-23 DIAGNOSIS — K922 Gastrointestinal hemorrhage, unspecified: Secondary | ICD-10-CM | POA: Diagnosis not present

## 2023-11-23 LAB — BASIC METABOLIC PANEL WITH GFR
Anion gap: 7 (ref 5–15)
BUN: 10 mg/dL (ref 8–23)
CO2: 28 mmol/L (ref 22–32)
Calcium: 8.4 mg/dL — ABNORMAL LOW (ref 8.9–10.3)
Chloride: 109 mmol/L (ref 98–111)
Creatinine, Ser: 0.88 mg/dL (ref 0.44–1.00)
GFR, Estimated: >60 mL/min (ref >60)
Glucose, Bld: 105 mg/dL — ABNORMAL HIGH (ref 70–99)
Potassium: 3.7 mmol/L (ref 3.5–5.1)
Sodium: 144 mmol/L (ref 135–145)

## 2023-11-23 LAB — CBC
HCT: 26.1 % — ABNORMAL LOW (ref 36.0–46.0)
Hemoglobin: 8.1 g/dL — ABNORMAL LOW (ref 12.0–15.0)
MCH: 30.6 pg (ref 26.0–34.0)
MCHC: 31 g/dL (ref 30.0–36.0)
MCV: 98.5 fL (ref 80.0–100.0)
Platelets: 185 K/uL (ref 150–400)
RBC: 2.65 MIL/uL — ABNORMAL LOW (ref 3.87–5.11)
RDW: 14.9 % (ref 11.5–15.5)
WBC: 11.8 K/uL — ABNORMAL HIGH (ref 4.0–10.5)
nRBC: 0 % (ref 0.0–0.2)

## 2023-11-23 LAB — HEMOGLOBIN AND HEMATOCRIT, BLOOD
HCT: 27.3 % — ABNORMAL LOW (ref 36.0–46.0)
Hemoglobin: 8.5 g/dL — ABNORMAL LOW (ref 12.0–15.0)

## 2023-11-23 MED ORDER — HYDROCODONE-ACETAMINOPHEN 10-325 MG PO TABS
1.0000 | ORAL_TABLET | Freq: Four times a day (QID) | ORAL | Status: DC | PRN
  Administered 2023-11-24 – 2023-11-27 (×3): 1 via ORAL
  Filled 2023-11-23 (×5): qty 1

## 2023-11-23 MED ORDER — DULOXETINE HCL 30 MG PO CPEP
60.0000 mg | ORAL_CAPSULE | Freq: Every day | ORAL | Status: DC
  Administered 2023-11-23 – 2023-11-28 (×6): 60 mg via ORAL
  Filled 2023-11-23 (×6): qty 2

## 2023-11-23 MED ORDER — SODIUM CHLORIDE 0.9 % IV SOLN: INTRAVENOUS | Status: AC | PRN

## 2023-11-23 NOTE — Evaluation (Signed)
 Physical Therapy Evaluation Patient Details Name: Jamie Phillips MRN: [id number] DOB: [birth year] Today's Date: 11/23/2023  History of Present Illness  83 y.o. 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  Patient also notes associated abdominal pain with passing clot.  S/p 7/31 embolectomy.  Clinical Impression  Pt did well with PT exam, showed good effort with ambulation and was able to do the entire loop with consistent and confident effort.  She had only mild fatigue and SpO2 in the 90s and HR in the 80s after the effort.  Pt reports feeling a little weaker than her baseline but overall feels good about being able to manage at home.  Will benefit from continued PT to get back to he PLOF.       If plan is discharge home, recommend the following:     Can travel by private vehicle        Equipment Recommendations Rolling walker (2 wheels)  Recommendations for Other Services       Functional Status Assessment Patient has had a recent decline in their functional status and demonstrates the ability to make significant improvements in function in a reasonable and predictable amount of time.     Precautions / Restrictions Precautions Precautions: Fall Recall of Precautions/Restrictions: Intact Restrictions Weight Bearing Restrictions Per Provider Order: No      Mobility  Bed Mobility               General bed mobility comments: in recliner pre/post    Transfers Overall transfer level: Modified independent Equipment used: Rolling walker (2 wheels)               General transfer comment: able to get to standing with relative ease, no assist or cuing needed    Ambulation/Gait Ambulation/Gait assistance: Supervision Gait Distance (Feet): 200  Feet Assistive device: Rolling walker (2 wheels)         General Gait Details: Pt assumes consistent and safe cadence with appropriate amount of UE/walker use.  She had only mild fatigue with effort and post ambulation SpO2 was 93% and HR in the 80s on room air.  Stairs            Wheelchair Mobility     Tilt Bed    Modified Rankin (Stroke Patients Only)       Balance Overall balance assessment: Needs assistance Sitting-balance support: Feet supported Sitting balance-Leahy Scale: Normal     Standing balance support: Bilateral upper extremity supported Standing balance-Leahy Scale: Good Standing balance comment: minimal unsteadiness standing w/o UEs support, no LOBs.  good balance with b/l UEs on walker                             Pertinent Vitals/Pain Pain Assessment Pain Assessment: No/denies pain    Home Living Family/patient expects to be discharged to:: Private residence Living Arrangements: Children (son) Available Help at Discharge: Family;Available PRN/intermittently (son works 7-3:30, sister and daughter live close if needed)   Home Access: Stairs to enter Entrance Stairs-Rails: Can reach both Secretary/administrator of Steps: 3     Home Equipment: Cane - single point;BSC/3in1;Rollator (4 wheels);Grab bars - tub/shower;Shower seat      Prior Function Prior Level of  Function : Independent/Modified Independent             Mobility Comments: Pt reports she is out of the home multiple X/week, drives, etc.  SPC in the home, RW out of the home. ADLs Comments: son does yardwork but she does bathing, dressing, laundry, meals, etc     Extremity/Trunk Assessment   Upper Extremity Assessment Upper Extremity Assessment: Overall WFL for tasks assessed;Generalized weakness    Lower Extremity Assessment Lower Extremity Assessment: Overall WFL for tasks assessed;Generalized weakness       Communication   Communication Communication: No  apparent difficulties    Cognition Arousal: Alert Behavior During Therapy: WFL for tasks assessed/performed   PT - Cognitive impairments: No apparent impairments                         Following commands: Intact       Cueing Cueing Techniques: Verbal cues     General Comments      Exercises     Assessment/Plan    PT Assessment Patient needs continued PT services  PT Problem List Decreased strength;Decreased activity tolerance;Decreased balance;Decreased mobility;Decreased knowledge of use of DME;Cardiopulmonary status limiting activity;Obesity       PT Treatment Interventions DME instruction;Gait training;Stair training;Functional mobility training;Therapeutic activities;Therapeutic exercise;Balance training;Neuromuscular re-education;Patient/family education    PT Goals (Current goals can be found in the Care Plan section)  Acute Rehab PT Goals Patient Stated Goal: go home PT Goal Formulation: With patient Time For Goal Achievement: 12/06/23 Potential to Achieve Goals: Good    Frequency Min 1X/week     Co-evaluation               AM-PAC PT 6 Clicks Mobility  Outcome Measure Help needed turning from your back to your side while in a flat bed without using bedrails?: None Help needed moving from lying on your back to sitting on the side of a flat bed without using bedrails?: None Help needed moving to and from a bed to a chair (including a wheelchair)?: None Help needed standing up from a chair using your arms (e.g., wheelchair or bedside chair)?: None Help needed to walk in hospital room?: None Help needed climbing 3-5 steps with a railing? : A Little 6 Click Score: 23    End of Session Equipment Utilized During Treatment: Gait belt Activity Tolerance: Patient tolerated treatment well Patient left: with call bell/phone within reach;with chair alarm set Nurse Communication: Mobility status PT Visit Diagnosis: Muscle weakness (generalized)  (M62.81);Difficulty in walking, not elsewhere classified (R26.2)    Time: 8854-8791 PT Time Calculation (min) (ACUTE ONLY): 23 min   Charges:   PT Evaluation $PT Eval Low Complexity: 1 Low   PT General Charges $$ ACUTE PT VISIT: 1 Visit         Carmin JONELLE Deed, DPT 11/23/2023, 1:21 PM

## 2023-11-23 NOTE — Progress Notes (Signed)
 [facility] Progress Note  Patient: Jamie Phillips    FMW:[id number]  DOA: 11/20/2023     Date of Service: the patient was seen and examined on 11/23/2023  Chief Complaint  Patient presents with   Rectal Bleeding   Brief hospital course: 83 y.o. 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  Patient also notes associated abdominal pain with passing clots. Patient notes loose stools yesterday but no blood or abdominal pain. She denies fever/chills/ n/v/ dysuria,sob/ or chest pain. She does endorse feeling week. She notes she has has 3 episodes of bleeding since being in ED.  She notes she last took her  Eliquis  last evening.   ED Course:  Patient on evaluation in ED was found to have stable vitals with CT bleed scan noting diverticulitis and active gi bleed. Patient hgb was noted to have 1 point drop. Patient was referred for admission as well as to IR for possible intervention.  7/31 s/p embolectomy done by IR Further management as below.  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  Patient requested if it can be done during this hospital stay.  Discussed with GI, recommended to follow-up as an outpatient.  # Pre diabetes -monitor poc    # DjD of lumbar spine -supportive care    # Acute blood loss anemia due to lower GI bleeding Iron profile, folate level and B12 within normal range.  # Hypocalcemia due to hypoalbuminemia, vitamin D  level within normal range   Body mass index is 41.59 kg/m.  Interventions:  Diet: CLD >> FLD>> soft diet on 8/3 DVT Prophylaxis: SCDs  Advance goals of care discussion: Full code  Family Communication: family was present at bedside, at the time of interview.  The pt provided permission to discuss medical plan with the family. Opportunity was given to ask question and all questions were answered satisfactorily.   Disposition:  Pt is from Home, admitted with acute lower GI bleed, still has risk of bleeding, on FLD, which precludes a safe discharge. Discharge to home, when stable, may need few days to improve.  Subjective: No significant events overnight, as per patient she had 1 episode of BM  with red blood last night.  Pain is well-controlled.  Denied any other complaints. Patient is hungry and does not like full liquid diet especially broth.  Requesting to start soft diet.   Physical Exam: General: NAD, lying comfortably Appear in no distress, affect appropriate Eyes: PERRLA ENT: Oral Mucosa Clear, moist  Neck: no JVD,  Cardiovascular: S1 and S2 Present, no Murmur,  Respiratory: good respiratory effort, Bilateral Air entry equal and Decreased, no Crackles, no wheezes Abdomen: BS present, Soft and RLQ tenderness,  Skin: no rashes Extremities: no Pedal edema, no calf  tenderness Neurologic: without any new focal findings Gait not checked due to patient safety concerns  Vitals:   11/22/23 2329 11/23/23 0437 11/23/23 0811 11/23/23 1058  BP: (!) 100/45 133/63 (!) 111/51 (!) 98/58  Pulse: 68 71 67 67  Resp: 17 20 20 20   Temp: 98.3 F (36.8 C) 98.4 F (36.9 C) 98 F (36.7 C) 98 F (36.7 C)  TempSrc: Oral  Oral   SpO2: 97% 95% 96% 99%  Weight:      Height:        Intake/Output Summary (Last 24 hours) at 11/23/2023 1520 Last data filed at 11/23/2023 1300 Gross per 24 hour  Intake 720 ml  Output 1850 ml  Net -1130 ml   Filed Weights   11/20/23 0832  Weight: 109.9 kg    Data Reviewed: I have personally reviewed and interpreted daily labs, tele strips, imagings as discussed above. I reviewed all nursing notes, pharmacy notes, vitals, pertinent old records I have discussed plan of care as described above with RN and patient/family.  CBC: Recent Labs  Lab 11/18/23 1012 11/18/23 1012 11/20/23 0826 11/20/23 1153 11/20/23 1557 11/21/23 0458 11/21/23 1047 11/22/23 0634 11/22/23 1708 11/23/23 0647 11/23/23 1348  WBC 12.2*  --  15.0* 14.1*  --  14.2*  --  12.6*  --  11.8*  --   NEUTROABS 4.8  --  3.5 5.2  --   --   --   --   --   --   --   HGB 12.2   < > 11.7* 10.8*   < > 8.2* 8.0* 8.4* 8.5* 8.1* 8.5*  HCT 38.8  --  37.7 35.4*  --  26.2*  --  26.6* 26.1* 26.1* 27.3*  MCV 96.8  --  97.7 98.9  --  97.4  --  98.2  --  98.5  --   PLT 217  --  263 219  --  179  --  173  --  185  --    < > = values in this interval not displayed.   Basic Metabolic Panel: Recent Labs  Lab 11/18/23 1012 11/20/23 0826 11/21/23 0458 11/22/23 0634 11/22/23 0935 11/23/23 0647  NA 138 142 142 146*  --  144  K 4.1 4.5 3.7 4.0  --  3.7  CL 100 104 108 111  --  109  CO2 31 27 27 30   --  28  GLUCOSE 77 123* 97 111*  --  105*  BUN 28* 24* 19 13  --  10  CREATININE 1.12* 0.90 0.85 0.96  --  0.88  CALCIUM  9.0 8.7* 8.2* 8.7*  --  8.4*  MG  --   --   --   --  2.2   --   PHOS  --   --   --   --  3.4  --     Studies: No results found.  Scheduled Meds:  DULoxetine   60 mg Oral Daily   gabapentin   400 mg Oral QID   pantoprazole   40 mg Oral Daily   sucralfate   1 g Oral Daily   Continuous Infusions:  piperacillin -tazobactam (ZOSYN )  IV 3.375 g (11/23/23 1103)   PRN Meds: acetaminophen , albuterol , fentaNYL  (SUBLIMAZE ) injection, HYDROcodone -acetaminophen , methocarbamol   Time spent: 35 minutes  Author: ELVAN SOR. MD [facility] 11/23/2023 3:20 PM  To reach On-call, see care teams to locate the attending and reach out to them via [email or web address]. If 7PM-7AM, please contact night-coverage If you still have difficulty reaching the attending provider, please page the [facility] (Director on Call) for [facility] on amion for assistance.

## 2023-11-23 NOTE — Plan of Care (Signed)
 This patient remains on AR-2A as of time of writing. The patient is AA+Ox4. No supplemental O2 requirement at this time. External urinary containment device in place. NPO at [facility] for EGD tomorrow. Ambulates with walker and 1 person assisting.    Problem: Education: Goal: Understanding of CV disease, CV risk reduction, and recovery process will improve Outcome: Progressing Goal: Individualized Educational Video(s) Outcome: Progressing   Problem: Activity: Goal: Ability to return to baseline activity level will improve Outcome: Progressing   Problem: Cardiovascular: Goal: Ability to achieve and maintain adequate cardiovascular perfusion will improve Outcome: Progressing Goal: Vascular access site(s) Level 0-1 will be maintained Outcome: Progressing   Problem: Health Behavior/Discharge Planning: Goal: Ability to safely manage health-related needs after discharge will improve Outcome: Progressing   Problem: Education: Goal: Knowledge of General Education information will improve Description: Including pain rating scale, medication(s)/side effects and non-pharmacologic comfort measures Outcome: Progressing   Problem: Health Behavior/Discharge Planning: Goal: Ability to manage health-related needs will improve Outcome: Progressing   Problem: Clinical Measurements: Goal: Ability to maintain clinical measurements within normal limits will improve Outcome: Progressing Goal: Will remain free from infection Outcome: Progressing Goal: Diagnostic test results will improve Outcome: Progressing Goal: Respiratory complications will improve Outcome: Progressing Goal: Cardiovascular complication will be avoided Outcome: Progressing   Problem: Activity: Goal: Risk for activity intolerance will decrease Outcome: Progressing   Problem: Nutrition: Goal: Adequate nutrition will be maintained Outcome: Progressing   Problem: Coping: Goal: Level of anxiety will decrease Outcome:  Progressing   Problem: Elimination: Goal: Will not experience complications related to bowel motility Outcome: Progressing Goal: Will not experience complications related to urinary retention Outcome: Progressing   Problem: Pain Managment: Goal: General experience of comfort will improve and/or be controlled Outcome: Progressing   Problem: Safety: Goal: Ability to remain free from injury will improve Outcome: Progressing   Problem: Skin Integrity: Goal: Risk for impaired skin integrity will decrease Outcome: Progressing   Problem: Education: Goal: Knowledge of disease and its progression will improve Outcome: Progressing Goal: Individualized Educational Video(s) Outcome: Progressing   Problem: Fluid Volume: Goal: Compliance with measures to maintain balanced fluid volume will improve Outcome: Progressing   Problem: Health Behavior/Discharge Planning: Goal: Ability to manage health-related needs will improve Outcome: Progressing   Problem: Nutritional: Goal: Ability to make healthy dietary choices will improve Outcome: Progressing   Problem: Clinical Measurements: Goal: Complications related to the disease process, condition or treatment will be avoided or minimized Outcome: Progressing   Problem: Education: Goal: Knowledge of the prescribed therapeutic regimen will improve Outcome: Progressing   Problem: Activity: Goal: Ability to implement measures to reduce episodes of fatigue will improve Outcome: Progressing   Problem: Bowel/Gastric: Goal: Will not experience complications related to bowel motility Outcome: Progressing   Problem: Coping: Goal: Ability to identify and develop effective coping behavior will improve Outcome: Progressing   Problem: Nutritional: Goal: Maintenance of adequate nutrition will improve Outcome: Progressing

## 2023-11-24 DIAGNOSIS — I4581 Long QT syndrome: Secondary | ICD-10-CM

## 2023-11-24 DIAGNOSIS — K922 Gastrointestinal hemorrhage, unspecified: Secondary | ICD-10-CM | POA: Diagnosis not present

## 2023-11-24 LAB — HEMOGLOBIN AND HEMATOCRIT, BLOOD
HCT: 27.1 % — ABNORMAL LOW (ref 36.0–46.0)
Hemoglobin: 8.4 g/dL — ABNORMAL LOW (ref 12.0–15.0)

## 2023-11-24 LAB — BASIC METABOLIC PANEL WITH GFR
Anion gap: 6 (ref 5–15)
BUN: 15 mg/dL (ref 8–23)
CO2: 28 mmol/L (ref 22–32)
Calcium: 8.7 mg/dL — ABNORMAL LOW (ref 8.9–10.3)
Chloride: 110 mmol/L (ref 98–111)
Creatinine, Ser: 1.24 mg/dL — ABNORMAL HIGH (ref 0.44–1.00)
GFR, Estimated: 44 mL/min — ABNORMAL LOW (ref >60)
Glucose, Bld: 114 mg/dL — ABNORMAL HIGH (ref 70–99)
Potassium: 4 mmol/L (ref 3.5–5.1)
Sodium: 144 mmol/L (ref 135–145)

## 2023-11-24 LAB — CBC
HCT: 25.4 % — ABNORMAL LOW (ref 36.0–46.0)
Hemoglobin: 7.9 g/dL — ABNORMAL LOW (ref 12.0–15.0)
MCH: 30.4 pg (ref 26.0–34.0)
MCHC: 31.1 g/dL (ref 30.0–36.0)
MCV: 97.7 fL (ref 80.0–100.0)
Platelets: 190 K/uL (ref 150–400)
RBC: 2.6 MIL/uL — ABNORMAL LOW (ref 3.87–5.11)
RDW: 14.9 % (ref 11.5–15.5)
WBC: 11.4 K/uL — ABNORMAL HIGH (ref 4.0–10.5)
nRBC: 0 % (ref 0.0–0.2)

## 2023-11-24 LAB — MAGNESIUM: Magnesium: 2.3 mg/dL (ref 1.7–2.4)

## 2023-11-24 LAB — PHOSPHORUS: Phosphorus: 3.4 mg/dL (ref 2.5–4.6)

## 2023-11-24 MED ORDER — ORAL CARE MOUTH RINSE: 15.0000 mL | OROMUCOSAL | Status: DC | PRN

## 2023-11-24 MED ORDER — POLYVINYL ALCOHOL 1.4 % OP SOLN
1.0000 [drp] | OPHTHALMIC | Status: DC | PRN
  Administered 2023-11-24 – 2023-11-25 (×2): 1 [drp] via OPHTHALMIC
  Filled 2023-11-24: qty 15

## 2023-11-24 NOTE — Progress Notes (Signed)
 PT Cancellation Note  Patient Details Name: Jamie Phillips MRN: [id number] DOB: 1940/07/13   Cancelled Treatment:    Reason Eval/Treat Not Completed: Patient declined, no reason specified. Pt seated in recliner chair upon arrival. Pt reporting that she did not feel well today, that she has been NPO since last evening around 8PM for a possible procedure today, but that it has not yet been confirmed. Pt declining to participate in PT or any mobility at this time. PT will continue to follow-up with pt acutely as available and appropriate.    [name] Mannix Kroeker 11/24/2023, 11:44 AM

## 2023-11-24 NOTE — Progress Notes (Signed)
 [facility] Progress Note  Patient: Jamie Phillips    FMW:[id number]  DOA: 11/20/2023     Date of Service: the patient was seen and examined on 11/24/2023  Chief Complaint  Patient presents with   Rectal Bleeding   Brief hospital course: 83 y.o. 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  Patient also notes associated abdominal pain with passing clots. Patient notes loose stools yesterday but no blood or abdominal pain. She denies fever/chills/ n/v/ dysuria,sob/ or chest pain. She does endorse feeling week. She notes she has has 3 episodes of bleeding since being in ED.  She notes she last took her  Eliquis  last evening.   ED Course:  Patient on evaluation in ED was found to have stable vitals with CT bleed scan noting diverticulitis and active gi bleed. Patient hgb was noted to have 1 point drop. Patient was referred for admission as well as to IR for possible intervention.  7/31 s/p embolectomy done by IR Further management as below.  Assessment and Plan:  # Lower GI bleed: noted to have diverticular bleed active on ct bleed  s/p Angiogram and embolization of small active bleed in the sigmoid colon -patient on Eliquis , last dose was on 7/30 pm dose   - noted relative hypotension and drop hgb 1 gram with episode of rebleed in ED -s/p Transfusion 1 unit PRBC as patient is symptomatic with ongoing bleed -monitor h/h transfuse if < 7 or patient with active bleed and symptomatic  Continue to hold Eliquis  for now 8/4 Hb 7.9, slightly drifted down Continue to hold Eliquis    # Acute diverticulitis  -continue with zosyn     # B/L Massive Pulmonary Emboli/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  Patient requested if it can be done during this hospital stay.  Discussed with GI, recommended to follow-up as an outpatient.  # Pre diabetes -monitor poc    # DjD of lumbar spine -supportive care    # Acute blood loss anemia due to lower GI bleeding Iron profile, folate level and B12 within normal range.  # Hypocalcemia due to hypoalbuminemia, vitamin D  level within normal range  # Prolonged Qtc 7/31 EKG QTc 516 8/4 EKG QTc 489 improving Avoid QTc prolonging medication  Body mass index is 40.15 kg/m.  Interventions:  Diet: CLD >> FLD>> soft diet on 8/3 DVT Prophylaxis: SCDs  Advance goals of care discussion: Full code  Family Communication: family was present at bedside, at the time of interview.  The pt provided permission to discuss medical plan with the family. Opportunity was given to ask question and all questions were answered satisfactorily.   Disposition:  Pt is from Home, admitted with acute lower GI bleed, still has risk of bleeding, on FLD, which precludes a safe discharge. Discharge to home, when stable,  may need few days to improve.  Subjective: No significant events overnight, patient had 1 episode of bowel movement last night which was red and black.  No overt bleeding. Complaining of right-sided chest wall pain off and on which is chronic.  Resolved during my exam. Patient was resting comfortably on the recliner, denied any other active issues   Physical Exam: General: NAD, lying comfortably Appear in no distress, affect appropriate Eyes: PERRLA ENT: Oral Mucosa Clear, moist  Neck: no JVD,  Cardiovascular: S1 and S2 Present, no Murmur,  Respiratory: good respiratory  effort, Bilateral Air entry equal and Decreased, no Crackles, no wheezes Abdomen: BS present, Soft and RLQ tenderness,  Skin: no rashes Extremities: no Pedal edema, no calf tenderness Neurologic: without any new focal findings Gait not checked due to patient safety concerns  Vitals:   11/24/23 0400 11/24/23 0737 11/24/23 1105 11/24/23 1510  BP:  134/75 (!) 155/90 130/71  Pulse:  92 89 71  Resp: 17 (!) 21 20 18   Temp:  98.1 F (36.7 C) 97.9 F (36.6 C) (!) 97.5 F (36.4 C)  TempSrc:      SpO2:  94% 98% 94%  Weight: 106.1 kg     Height:        Intake/Output Summary (Last 24 hours) at 11/24/2023 1647 Last data filed at 11/24/2023 0400 Gross per 24 hour  Intake 411.53 ml  Output 300 ml  Net 111.53 ml   Filed Weights   11/20/23 0832 11/24/23 0400  Weight: 109.9 kg 106.1 kg    Data Reviewed: I have personally reviewed and interpreted daily labs, tele strips, imagings as discussed above. I reviewed all nursing notes, pharmacy notes, vitals, pertinent old records I have discussed plan of care as described above with RN and patient/family.  CBC: Recent Labs  Lab 11/18/23 1012 11/18/23 1012 11/20/23 0826 11/20/23 1153 11/20/23 1557 11/21/23 0458 11/21/23 1047 11/22/23 0634 11/22/23 1708 11/23/23 0647 11/23/23 1348 11/24/23 0501  WBC 12.2*   < > 15.0* 14.1*  --  14.2*  --  12.6*  --  11.8*  --  11.4*  NEUTROABS 4.8  --  3.5 5.2  --   --   --   --   --   --   --   --   HGB 12.2   < > 11.7* 10.8*   < > 8.2*   < > 8.4* 8.5* 8.1* 8.5* 7.9*  HCT 38.8  --  37.7 35.4*  --  26.2*  --  26.6* 26.1* 26.1* 27.3* 25.4*  MCV 96.8  --  97.7 98.9  --  97.4  --  98.2  --  98.5  --  97.7  PLT 217   < > 263 219  --  179  --  173  --  185  --  190   < > = values in this interval not displayed.   Basic Metabolic Panel: Recent Labs  Lab 11/20/23 0826 11/21/23 0458 11/22/23 0634 11/22/23 0935 11/23/23 0647 11/24/23 0501  NA 142 142 146*  --  144 144  K 4.5 3.7 4.0  --  3.7 4.0  CL  104 108 111  --  109 110  CO2 27 27 30   --  28 28  GLUCOSE 123* 97 111*  --  105* 114*  BUN 24* 19 13  --  10 15  CREATININE 0.90 0.85 0.96  --  0.88 1.24*  CALCIUM  8.7* 8.2* 8.7*  --  8.4* 8.7*  MG  --   --   --  2.2  --  2.3  PHOS  --   --   --  3.4  --  3.4    Studies: No results found.  Scheduled Meds:  DULoxetine   60 mg Oral Daily   gabapentin   400 mg Oral QID   pantoprazole   40 mg Oral Daily   sucralfate   1 g Oral Daily   Continuous Infusions:  sodium chloride  Stopped (11/24/23 0357)   piperacillin -tazobactam (ZOSYN )  IV 3.375 g (11/24/23 1316)   PRN Meds: sodium chloride , acetaminophen , albuterol , artificial tears, fentaNYL  (SUBLIMAZE ) injection, HYDROcodone -acetaminophen , methocarbamol , mouth rinse  Time spent: 35 minutes  Author: ELVAN SOR. MD [facility] 11/24/2023 4:47 PM  To reach On-call, see care teams to locate the attending and reach out to them via [email or web address]. If 7PM-7AM, please contact night-coverage If you still have difficulty reaching the attending provider, please page the [facility] (Director on Call) for [facility] on amion for assistance.

## 2023-11-24 NOTE — Evaluation (Signed)
 Occupational Therapy Evaluation Patient Details Name: Jamie Phillips MRN: [id number] DOB: 13-Oct-1940 Today's Date: 11/24/2023   History of Present Illness   83 y.o. 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  Patient also notes associated abdominal pain with passing clot.  S/p 7/31 embolectomy.     Clinical Impressions Patient presenting with decreased Ind in self care, balance, functional mobility/transfers, endurance, and safety awareness. Patient reports being Ind at baseline and living with family. Pt moves in bed without assistance but declines OOB activity and reports feeling unwell. While therapist in room, pt begins making faces and states she is having some chest pain. OT immediately notified RN to assess pt further who reports sharp chest pain and MD notified. Pt repositioned in bed and session terminated at this time for safety. Call bell and all needed items within reach upon exiting the room.  Patient will benefit from acute OT to increase overall independence in the areas of ADLs, functional mobility, and safety awareness in order to safely discharge.     If plan is discharge home, recommend the following:   A little help with walking and/or transfers;A little help with bathing/dressing/bathroom;Assistance with cooking/housework     Functional Status Assessment   Patient has had a recent decline in their functional status and demonstrates the ability to make significant improvements in function in a reasonable and predictable amount of time.     Equipment Recommendations   None recommended by OT      Precautions/Restrictions   Precautions Precautions: Fall Recall of Precautions/Restrictions: Intact     Mobility Bed Mobility Overal bed  mobility: Needs Assistance Bed Mobility: Rolling Rolling: Supervision              Transfers                              ADL either performed or assessed with clinical judgement   ADL Overall ADL's : Needs assistance/impaired     Grooming: Wash/dry hands;Wash/dry face;Bed level;Set up                                 General ADL Comments: Session limited secondary to chest pain     Vision Patient Visual Report: No change from baseline              Pertinent Vitals/Pain Pain Assessment Pain Assessment: No/denies pain     Extremity/Trunk Assessment Upper Extremity Assessment Upper Extremity Assessment: Generalized weakness   Lower Extremity Assessment Lower Extremity Assessment: Generalized weakness       Communication Communication Communication: No apparent difficulties   Cognition Arousal: Alert Behavior During Therapy: WFL for tasks assessed/performed Cognition: No apparent impairments                               Following commands: Intact       Cueing  General Comments   Cueing Techniques: Verbal cues              Home Living Family/patient expects to be discharged to:: Private residence Living Arrangements: Children (son) Available Help at Discharge: Family;Available  PRN/intermittently Type of Home: House Home Access: Stairs to enter [employer] of Steps: 3 Entrance Stairs-Rails: Can reach both Home Layout: One level     Bathroom Shower/Tub: Producer, television/film/video: Standard     Home Equipment: Cane - single point;BSC/3in1;Rollator (4 wheels);Grab bars - tub/shower;Shower seat          Prior Functioning/Environment Prior Level of Function : Independent/Modified Independent             Mobility Comments: Pt reports she is out of the home multiple X/week, drives, etc.  SPC in the home, RW out of the home. ADLs Comments: son does yardwork but she does bathing,  dressing, laundry, meals, etc.She is very active.    OT Problem List: Decreased strength;Impaired balance (sitting and/or standing);Decreased safety awareness;Decreased activity tolerance;Decreased knowledge of use of DME or AE   OT Treatment/Interventions: Self-care/ADL training;Therapeutic exercise;Balance training;Energy conservation      OT Goals(Current goals can be found in the care plan section)   Acute Rehab OT Goals Patient Stated Goal: to feel better OT Goal Formulation: With patient Time For Goal Achievement: 12/08/23 Potential to Achieve Goals: Fair ADL Goals Pt Will Perform Grooming: with modified independence;standing Pt Will Perform Lower Body Dressing: with modified independence;sit to/from stand Pt Will Transfer to Toilet: with modified independence;ambulating Pt Will Perform Toileting - Clothing Manipulation and hygiene: with modified independence;sit to/from stand   OT Frequency:  Min 2X/week       AM-PAC OT 6 Clicks Daily Activity     Outcome Measure Help from another person eating meals?: None Help from another person taking care of personal grooming?: None Help from another person toileting, which includes using toliet, bedpan, or urinal?: A Little Help from another person bathing (including washing, rinsing, drying)?: A Little Help from another person to put on and taking off regular upper body clothing?: None Help from another person to put on and taking off regular lower body clothing?: A Little 6 Click Score: 21   End of Session Nurse Communication: Mobility status  Activity Tolerance: Treatment limited secondary to medical complications (Comment) Patient left: in bed;with call bell/phone within reach;with nursing/sitter in room  OT Visit Diagnosis: Unsteadiness on feet (R26.81);Muscle weakness (generalized) (M62.81)                Time: 1000-1014 OT Time Calculation (min): 14 min Charges:  OT General Charges $OT Visit: 1 Visit OT  Evaluation $OT Eval Low Complexity: 1 Low  Izetta Claude, MS, OTR/L , CBIS ascom [phone]  11/24/23, 12:50 PM

## 2023-11-25 DIAGNOSIS — K922 Gastrointestinal hemorrhage, unspecified: Secondary | ICD-10-CM | POA: Diagnosis not present

## 2023-11-25 LAB — BASIC METABOLIC PANEL WITH GFR
Anion gap: 5 (ref 5–15)
BUN: 16 mg/dL (ref 8–23)
CO2: 31 mmol/L (ref 22–32)
Calcium: 8.7 mg/dL — ABNORMAL LOW (ref 8.9–10.3)
Chloride: 107 mmol/L (ref 98–111)
Creatinine, Ser: 1.31 mg/dL — ABNORMAL HIGH (ref 0.44–1.00)
GFR, Estimated: 41 mL/min — ABNORMAL LOW (ref >60)
Glucose, Bld: 115 mg/dL — ABNORMAL HIGH (ref 70–99)
Potassium: 3.9 mmol/L (ref 3.5–5.1)
Sodium: 143 mmol/L (ref 135–145)

## 2023-11-25 LAB — CBC
HCT: 25.5 % — ABNORMAL LOW (ref 36.0–46.0)
Hemoglobin: 7.8 g/dL — ABNORMAL LOW (ref 12.0–15.0)
MCH: 30.5 pg (ref 26.0–34.0)
MCHC: 30.6 g/dL (ref 30.0–36.0)
MCV: 99.6 fL (ref 80.0–100.0)
Platelets: 212 K/uL (ref 150–400)
RBC: 2.56 MIL/uL — ABNORMAL LOW (ref 3.87–5.11)
RDW: 15.1 % (ref 11.5–15.5)
WBC: 12.8 K/uL — ABNORMAL HIGH (ref 4.0–10.5)
nRBC: 0 % (ref 0.0–0.2)

## 2023-11-25 LAB — HEMOGLOBIN AND HEMATOCRIT, BLOOD
HCT: 28 % — ABNORMAL LOW (ref 36.0–46.0)
Hemoglobin: 8.8 g/dL — ABNORMAL LOW (ref 12.0–15.0)

## 2023-11-25 LAB — GLUCOSE, CAPILLARY: Glucose-Capillary: 134 mg/dL — ABNORMAL HIGH (ref 70–99)

## 2023-11-25 MED ORDER — SACCHAROMYCES BOULARDII 250 MG PO CAPS
250.0000 mg | ORAL_CAPSULE | Freq: Two times a day (BID) | ORAL | Status: DC
  Administered 2023-11-25 – 2023-11-28 (×6): 250 mg via ORAL
  Filled 2023-11-25 (×6): qty 1

## 2023-11-25 MED ORDER — HEPARIN (PORCINE) 25000 UT/250ML-% IV SOLN: 1100.0000 [IU]/h | INTRAVENOUS | Status: DC

## 2023-11-25 MED ORDER — ESCITALOPRAM OXALATE 10 MG PO TABS
20.0000 mg | ORAL_TABLET | Freq: Every day | ORAL | Status: DC
  Administered 2023-11-25 – 2023-11-28 (×4): 20 mg via ORAL
  Filled 2023-11-25 (×4): qty 2

## 2023-11-25 NOTE — TOC Initial Note (Signed)
 Transition of Care [facility]) - Initial/Assessment Note    Patient Details  Name: Jamie Phillips MRN: [id number] Date of Birth: Apr 29, 1940  Transition of Care [facility]) CM/SW Contact:    Lauraine JAYSON Carpen, LCSW Phone Number: 11/25/2023, 2:08 PM  Clinical Narrative:   CSW met with patient. No family at bedside. CSW introduced role and explained that therapy recommendations would be discussed. She is agreeable to home health if they will stay longer than 5-10 minutes. CSW provided CMS list for her to review and determine preference. Patient stated she already has a RW at home. No further concerns. CSW will continue to follow patient for support and facilitate return home once stable. Son or daughter will transport her home at discharge.              Expected Discharge Plan: Home w Home Health Services Barriers to Discharge: Continued Medical Work up   Patient Goals and CMS Choice            Expected Discharge Plan and Services     Post Acute Care Choice: Home Health Living arrangements for the past 2 months: Single Family Home                                      Prior Living Arrangements/Services Living arrangements for the past 2 months: Single Family Home Lives with:: Adult Children Patient language and need for interpreter reviewed:: Yes Do you feel safe going back to the place where you live?: Yes      Need for Family Participation in Patient Care: Yes (Comment) Care giver support system in place?: Yes (comment) Current home services: DME Criminal Activity/Legal Involvement Pertinent to Current Situation/Hospitalization: No - Comment as needed  Activities of Daily Living   ADL Screening (condition at time of admission) Independently performs ADLs?: Yes (appropriate for developmental age) Is the patient deaf or have difficulty hearing?: Yes Does the patient have difficulty seeing, even when wearing glasses/contacts?: No Does the patient have difficulty concentrating,  remembering, or making decisions?: No  Permission Sought/Granted                  Emotional Assessment Appearance:: Appears stated age Attitude/Demeanor/Rapport: Engaged Affect (typically observed): Appropriate, Calm Orientation: : Oriented to Self, Oriented to Place, Oriented to  Time, Oriented to Situation Alcohol  / Substance Use: Not Applicable Psych Involvement: No (comment)  Admission diagnosis:  Diverticulitis [K57.92] RLQ abdominal pain [R10.31] Lower GI bleed [K92.2] Prolonged Q-T interval on ECG [R94.31] Gastrointestinal hemorrhage, unspecified gastrointestinal hemorrhage type [K92.2] Patient Active Problem List   Diagnosis Date Noted   Lower GI bleed 11/20/2023   Closed fracture of right wrist 09/23/2022   Muscle spasms of both lower extremities 03/11/2022   Abnormality of gait 11/16/2021   Lumbar stenosis with neurogenic claudication 07/09/2021   Abnormal MRI, hip (Right) (12/23/2020) 06/12/2021   Chronic hip pain (Right) 06/05/2021   Primary osteoarthritis of hip (Right) 06/05/2021   Hypocalcemia 12/12/2020   Sacroiliac joint dysfunction of right side 12/12/2020   Chronic right sacroiliac joint pain 12/12/2020   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HCC) 12/12/2020   Chronic hip pain (Bilateral) 11/15/2020   Trochanteric bursitis of hips (Bilateral) 11/15/2020   Iliopsoas bursitis of hips 11/15/2020   Decreased range of motion of lumbar spine 11/15/2020   Osteoarthritis of hips (Bilateral) 11/15/2020   Abnormal CT scan, lumbar spine (06/16/2020) 09/20/2020   Abnormal EMG (  electromyogram) (05/09/2020) 09/07/2020   Spondylosis without myelopathy or radiculopathy, lumbosacral region 08/15/2020   Chronic low back pain (2ry area of Pain) (Bilateral) w/o sciatica 08/15/2020   Chronic use of opiate for therapeutic purpose 07/27/2020   Vitamin D  deficiency 07/27/2020   Chronic lower extremity radicular pain (L1/L2 dermatomes) (Bilateral) 06/21/2020   Chronic  groin pain (Bilateral) 06/21/2020   Neurogenic pain, lower extremity (Bilateral) 06/21/2020   Shooting pain, intermittent (electrical-like) 06/21/2020   History of total knee replacement (Bilateral)  06/21/2020   Chronic low back pain (Bilateral) w/ sciatica (Bilateral) 04/24/2020   Lumbosacral radiculopathy at L1 (Bilateral) 04/24/2020   Lumbosacral radiculopathy at L5 (Left) 04/24/2020   Lumbosacral radiculopathy at S1 (Right) 04/24/2020   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) 04/24/2020   Abnormal MRI, lumbar spine (01/24/2020) 04/24/2020   Lumbar facet syndrome (Multilevel) (Bilateral) 04/24/2020   Lumbar facet hypertrophy (Multilevel) (Bilateral) 04/24/2020   Severe muscle deconditioning 04/24/2020   Lower extremity weakness (Bilateral) 04/24/2020   Lumbosacral facet hypertrophy (Multilevel) (Bilateral) 04/24/2020   Lumbar scoliosis (idiopathic) 04/24/2020   Spondylolisthesis of lumbosacral region (Multilevel) 04/24/2020   Retrolisthesis (L1/L2) 04/24/2020   Anterolisthesis of lumbar spine (L3/L4, L4/L5) 04/24/2020   Anterolisthesis of lumbosacral spine (L5/S1) 04/24/2020   Lumbar lateral recess stenosis (Bilateral: L3-4, L4-5) (Severe) 04/24/2020   Lumbar foraminal stenosis (Right: L4-5) 04/24/2020   Synovial cyst of lumbar facet joint (L3-4) (Right) 04/24/2020   Arthropathy of spinal facet joint concurrent with and due to effusion (L3-4) 04/24/2020   Annular tear of lumbar disc (L3-4) (Left) 04/24/2020   Adnexal cyst 04/23/2020   Chronic pain syndrome 04/23/2020   Pharmacologic therapy 04/23/2020   Disorder of skeletal system 04/23/2020   Problems influencing health status 04/23/2020   Chronic anticoagulation (Eliquis ) 03/06/2020   Other fatigue 03/07/2019   Monoclonal gammopathy 03/04/2019   COVID-19 12/14/2018   Walker as ambulation aid 11/04/2018   Right lower quadrant pain 10/09/2018   DDD (degenerative disc disease), lumbosacral 03/27/2018    RLS (restless legs syndrome) 10/07/2017   Morbid obesity with BMI of 40.0-44.9, adult (HCC) 01/30/2017   Calculus of gallbladder without cholecystitis without obstruction 08/27/2016   Healthcare maintenance 08/06/2016   History of DVT in adulthood 03/04/2016   DJD (degenerative joint disease) 03/04/2016   Chronic lower extremity pain (1ry area of Pain) (Bilateral) 03/04/2016   Venous insufficiency 03/04/2016   Pes anserine bursitis 01/30/2016   Chronic pulmonary embolism (HCC) 08/25/2015   Acute respiratory failure (HCC) 08/14/2015   Acute bronchitis 05/11/2015   Essential hypertension 05/04/2015   Hyperlipidemia 05/04/2015   Pain in the chest 05/04/2015   SOB (shortness of breath) 05/04/2015   Hyperglycemia, unspecified 10/13/2014   Prediabetes 10/13/2014   Depression, major, in remission (HCC) 02/06/2014   CLL (chronic lymphocytic leukemia) (HCC) 01/23/2014   Generalized OA 01/23/2014   GERD without esophagitis 01/23/2014   History of Bell's palsy 01/23/2014   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) 01/23/2014   OSA on CPAP 01/23/2014   Pure hypercholesterolemia 01/23/2014   PCP:  Lenon Layman ORN, MD Pharmacy:   [facility] - [place], [place] - [address] [place] [address] Phone: [phone] Fax: [phone]  [facility] [address], [place] - [address] [place] [address] Phone: [phone] Fax: [phone]     Social Drivers of Health (SDOH) Social History: SDOH Screenings   Food Insecurity: No Food Insecurity (11/20/2023)  Housing:  Low Risk  (11/20/2023)  Transportation Needs: No Transportation Needs (11/20/2023)  Utilities: Not At Risk (11/20/2023)  Depression (PHQ2-9): Medium Risk (07/04/2023)  Financial Resource Strain: Low Risk  (01/13/2023)   Received from [facility]  Physical Activity: Unknown (03/18/2017)   Received from [facility]  Social Connections: Moderately Integrated (11/20/2023)  Stress: Unknown (03/18/2017)   Received from [facility]  Tobacco Use: Low Risk  (11/20/2023)   SDOH Interventions:     Readmission Risk Interventions     No data to display

## 2023-11-25 NOTE — Plan of Care (Signed)
  Problem: Education: Goal: Understanding of CV disease, CV risk reduction, and recovery process will improve Outcome: Progressing   Problem: Activity: Goal: Ability to return to baseline activity level will improve Outcome: Not Progressing   Problem: Education: Goal: Knowledge of General Education information will improve Description: Including pain rating scale, medication(s)/side effects and non-pharmacologic comfort measures Outcome: Not Progressing   Problem: Clinical Measurements: Goal: Respiratory complications will improve Outcome: Progressing Goal: Cardiovascular complication will be avoided Outcome: Progressing

## 2023-11-25 NOTE — Consult Note (Signed)
 PHARMACY - ANTICOAGULATION CONSULT NOTE  Pharmacy Consult for Heparin  infusion Indication: pulmonary embolus  No Known Allergies  Patient Measurements: Height: 5' 4 (162.6 cm) Weight: 106.1 kg (233 lb 14.4 oz) IBW/kg (Calculated) : 54.7 HEPARIN  DW (KG): 80.8  Vital Signs: Temp: 97.5 F (36.4 C) (08/05 1143) BP: 138/84 (08/05 1143) Pulse Rate: 88 (08/05 1143)  Labs: Recent Labs    11/23/23 0647 11/23/23 1348 11/24/23 0501 11/24/23 1847 11/25/23 0503  HGB 8.1*   < > 7.9* 8.4* 7.8*  HCT 26.1*   < > 25.4* 27.1* 25.5*  PLT 185  --  190  --  212  CREATININE 0.88  --  1.24*  --  1.31*   < > = values in this interval not displayed.   Estimated Creatinine Clearance: 40 mL/min (A) (by C-G formula based on SCr of 1.31 mg/dL (H)).  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 TYLENOL  ARTHRITIS   Unknown   apixaban  (ELIQUIS ) 5 MG TABS tablet Take 1 tablet (5 mg total) by mouth 2 (two) times daily. 180 tablet 3 11/19/2023 at   6:00 PM   b complex vitamins capsule Take 1 capsule by mouth daily.   11/19/2023   calcium  carbonate (OSCAL) 1500 (600 Ca) MG TABS tablet Take 1,500 mg by mouth 2 (two) times daily with a meal.   11/19/2023   Cholecalciferol (VITAMIN D -3) 125 MCG (5000 UT) TABS Take by mouth daily.   11/19/2023   DULoxetine  (CYMBALTA ) 60 MG capsule Take 1 capsule (60 mg total) by mouth daily. With Other SSRI 90 capsule 3 11/19/2023   escitalopram  (LEXAPRO ) 20 MG tablet Take 1 tablet by mouth daily.   11/19/2023   gabapentin  (NEURONTIN ) 400 MG capsule Take 1 capsule (400 mg total) by mouth 4 (four) times daily. Take 1 pill in Am and 2 pills at night- for nerve pain 360 capsule 1 11/19/2023 Evening   HYDROcodone -acetaminophen  (NORCO) 10-325 MG tablet Take 1 tablet by mouth every 6 (six) hours as needed. 120 tablet 0 11/19/2023   losartan -hydrochlorothiazide  (HYZAAR) 100-12.5 MG tablet Take 0.5 tablets by mouth daily. Reported on 08/22/2015 30 tablet  11/19/2023 Morning   magnesium  gluconate (MAGONATE) 500 MG tablet Take 500 mg by mouth 2 (two) times daily.   11/19/2023   methocarbamol  (ROBAXIN ) 500 MG  tablet TAKE 1 TABLET (500 MG TOTAL) EVERY 8 (EIGHT) HOURS AS NEEDED FOR MUSCLE SPASMS. 180 tablet 5 11/19/2023   Multiple Vitamins-Minerals (PRESERVISION AREDS 2 PO) Take 1 tablet by mouth in the morning and at bedtime.   11/19/2023   pantoprazole  (PROTONIX ) 40 MG tablet    11/19/2023   pravastatin  (PRAVACHOL ) 40 MG tablet Take 1 tablet by mouth daily.   11/19/2023   propranolol  (INDERAL ) 20 MG tablet Take 1 tablet by mouth 2 (two) times daily.   11/19/2023   sucralfate  (CARAFATE ) 1 g tablet Take 1 g by mouth daily.   11/19/2023   torsemide (DEMADEX) 20 MG tablet    11/19/2023   TURMERIC CURCUMIN PO Take 1 capsule by mouth in the morning and at bedtime.   11/19/2023   cholestyramine  (QUESTRAN ) 4 g packet Take 1 packet (4 g total) by mouth 3 (three) times daily with meals. 60 each 0 Unknown   Assessment: Patient on Eliquis  Pta for  massive PE. 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 Admitted with GI bleed and low Hgb. Pharmacy consulted to start and manage heparin  infusion but MD requesting to keep HL at low therapeutic range and NO BOLUS.  Plan transition back to DOAC in 24hrs if no bleeding noted. Last Eliquis  dose was before admission on 7/31.  Goal of Therapy:  Heparin  level 0.3-0.5 units/ml Monitor platelets by anticoagulation protocol: Yes   Plan:  Start heparin  infusion at 1100 units/hr Check HL and aPTT in 8 hours CBC daily while on heparin   Angelique Chevalier Rodriguez-Guzman PharmD, BCPS 11/25/2023 1:44 PM

## 2023-11-25 NOTE — Progress Notes (Signed)
 Occupational Therapy Treatment Patient Details Name: Jamie Phillips MRN: [id number] DOB: 1941-03-06 Today's Date: 11/25/2023   History of present illness 83 y.o. 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  Patient also notes associated abdominal pain with passing clot.  S/p 7/31 embolectomy.   OT comments  Pt seen for OT treatment on this date. Upon arrival to room pt resting in bed, easily awakes to voice. Pt requires MODI for bed mobility and STS from EOB with use of RW. Pt amb within room to the BR with RW + CGA with good use of safety awareness when navigating with RW. Pt requires MAXA for standing pericare post void in toilet, bloody stool noted - RN aware. No LOB noted during amb throughout session ~110ft. Pt returned to recliner for seated grooming tasks with setupA. Pt making good progress toward goals, will continue to follow POC. Discharge recommendation remains appropriate.        If plan is discharge home, recommend the following:  A little help with walking and/or transfers;A little help with bathing/dressing/bathroom;Assistance with cooking/housework   Equipment Recommendations  None recommended by OT    Recommendations for Other Services      Precautions / Restrictions Precautions Precautions: Fall Recall of Precautions/Restrictions: Intact Restrictions Weight Bearing Restrictions Per Provider Order: No       Mobility Bed Mobility Overal bed mobility: Modified Independent Bed Mobility: Supine to Sit     Supine to sit: Modified independent (Device/Increase time)     General bed mobility comments: No physical assistance to complete    Transfers Overall transfer level: Modified independent Equipment used: Rolling walker (2 wheels)                General transfer comment: Extra time to come to full standing position from the EOB, verbal cues for posture correction     Balance Overall balance assessment: Needs assistance Sitting-balance support: Feet supported Sitting balance-Leahy Scale: Normal Sitting balance - Comments: Steady reach within BOS   Standing balance support: Bilateral upper extremity supported Standing balance-Leahy Scale: Good Standing balance comment: Improved balance with use of RW                           ADL either performed or assessed with clinical judgement   ADL Overall ADL's : Needs assistance/impaired Eating/Feeding: Set up;Sitting   Grooming: Oral care;Wash/dry face;Sitting;Set up               Lower Body Dressing: Maximal assistance;Sit to/from stand   Toilet Transfer: Contact guard assist;Ambulation;Rolling walker (2 wheels)   Toileting- Clothing Manipulation and Hygiene: Maximal assistance;Sit to/from stand       Functional mobility during ADLs: Contact guard assist;Rolling walker (2 wheels) General ADL Comments: Motivated to complete ADL tasks this date     Communication Communication Communication: No apparent difficulties   Cognition Arousal: Alert Behavior During Therapy: WFL for tasks assessed/performed Cognition: No apparent impairments             OT - Cognition Comments: A/Ox4                 Following commands: Intact        Cueing   Cueing Techniques: Verbal cues  Exercises Exercises: Other exercises Other Exercises Other Exercises: Edu: Role of OT session, benefits of amb to and from BR during the day                 Pertinent Vitals/ Pain       Pain Assessment Pain Assessment: No/denies pain                                                          Frequency  Min 2X/week        Progress Toward Goals  OT Goals(current goals can now be found in the care plan section)  Progress towards OT  goals: Progressing toward goals  Acute Rehab OT Goals OT Goal Formulation: With patient Time For Goal Achievement: 12/08/23 Potential to Achieve Goals: Fair ADL Goals Pt Will Perform Grooming: with modified independence;standing Pt Will Perform Lower Body Dressing: with modified independence;sit to/from stand Pt Will Transfer to Toilet: with modified independence;ambulating Pt Will Perform Toileting - Clothing Manipulation and hygiene: with modified independence;sit to/from stand   AM-PAC OT 6 Clicks Daily Activity     Outcome Measure   Help from another person eating meals?: None Help from another person taking care of personal grooming?: None Help from another person toileting, which includes using toliet, bedpan, or urinal?: A Little Help from another person bathing (including washing, rinsing, drying)?: A Little Help from another person to put on and taking off regular upper body clothing?: None Help from another person to put on and taking off regular lower body clothing?: A Little 6 Click Score: 21    End of Session Equipment Utilized During Treatment: Gait belt;Rolling walker (2 wheels)  OT Visit Diagnosis: Unsteadiness on feet (R26.81);Muscle weakness (generalized) (M62.81)   Activity Tolerance Patient tolerated treatment well   Patient Left in chair;with call bell/phone within reach;with chair alarm set   Nurse Communication Mobility status        Time: 8862-8791 OT Time Calculation (min): 31 min  Charges: OT General Charges $OT Visit: 1 Visit OT Treatments $Self Care/Home Management : 8-22 mins  Larraine Colas M.S. OTR/L  11/25/23, 1:37 PM

## 2023-11-25 NOTE — Progress Notes (Signed)
 [facility] Progress Note  Patient: Jamie Phillips    FMW:[id number]  DOA: 11/20/2023     Date of Service: the patient was seen and examined on 11/25/2023  Chief Complaint  Patient presents with   Rectal Bleeding   Brief hospital course: 83 y.o. 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  Patient also notes associated abdominal pain with passing clots. Patient notes loose stools yesterday but no blood or abdominal pain. She denies fever/chills/ n/v/ dysuria,sob/ or chest pain. She does endorse feeling week. She notes she has has 3 episodes of bleeding since being in ED.  She notes she last took her  Eliquis  last evening.   ED Course:  Patient on evaluation in ED was found to have stable vitals with CT bleed scan noting diverticulitis and active gi bleed. Patient hgb was noted to have 1 point drop. Patient was referred for admission as well as to IR for possible intervention.  7/31 s/p embolectomy done by IR Further management as below.  Assessment and Plan:  # Lower GI bleed: noted to have diverticular bleed active on ct bleed  s/p Angiogram and embolization of small active bleed in the sigmoid colon -patient on Eliquis , last dose was on 7/30 pm dose   - noted relative hypotension and drop hgb 1 gram with episode of rebleed in ED -s/p Transfusion 1 unit PRBC as patient is symptomatic with ongoing bleed -monitor h/h transfuse if < 7 or patient with active bleed and symptomatic  Continue to hold Eliquis  for now 8/5 Hb 7.8>>8.8, stable but still has mild to moderate bleeding noticed in the BM as per RN Continue to hold Eliquis    # Acute diverticulitis  -continue with zosyn   Started probiotics  # B/L Massive Pulmonary Emboli/R LE DVT on Eliquis  -insetting of   CLL/SLL  -s/p IR embolization  - Continue to hold Eliquis   -once h/h stable will start heparin  low dose w/o bolus and then gradually transition to Eliquis  if no recurrent bleeding    # CKD 3a -at baseline  -no active issues    # OSA  -resume CPAP   # HLD -resume oral medication once tolerating po    # GERD -ppi 8/3 history of esophageal stricture, patient is scheduled for EGD and dilatation as an outpatient on 8/15.  Patient requested if it can be done during this hospital stay.  Discussed with GI, recommended to follow-up as an outpatient.  # Pre diabetes -monitor poc    # DjD of lumbar spine -supportive care    # Acute blood loss anemia due to lower GI bleeding Iron profile, folate level and B12 within normal range.  # Hypocalcemia due to hypoalbuminemia, vitamin D  level within normal range  # Prolonged Qtc 7/31 EKG QTc 516 8/4 EKG QTc 489 improving Avoid QTc prolonging medication 8/5 Lexapro  resumed on patient's request.,  Continue to monitor on telemetry  # Depression: Continue Cymbalta  and Lexapro  Home meds   Body mass index is 40.15 kg/m.  Interventions:  Diet: CLD >> FLD>> soft diet on 8/3 DVT Prophylaxis: SCDs  Advance goals of care discussion: Full code  Family Communication: family was not present at bedside, at the time of interview.  The pt provided permission to discuss medical plan  with the family. Opportunity was given to ask question and all questions were answered satisfactorily.   Disposition:  Pt is from Home, admitted with acute lower GI bleed, still has risk of bleeding, on FLD, which precludes a safe discharge. Discharge to home, when stable, may need few days to improve.  Subjective: No significant events overnight, patient had 1 BM and had little vaginal bleeding.  As per RN bleeding was mild to moderate.  Patient denied any pain, overall she feels improvement.   Physical Exam: General: NAD, lying comfortably Appear in no distress,  affect appropriate Eyes: PERRLA ENT: Oral Mucosa Clear, moist  Neck: no JVD,  Cardiovascular: S1 and S2 Present, no Murmur,  Respiratory: good respiratory effort, Bilateral Air entry equal and Decreased, no Crackles, no wheezes Abdomen: BS present, Soft and RLQ tenderness,  Skin: no rashes Extremities: no Pedal edema, no calf tenderness Neurologic: without any new focal findings Gait not checked due to patient safety concerns  Vitals:   11/25/23 0335 11/25/23 0810 11/25/23 1143 11/25/23 1611  BP: 124/61 135/62 138/84 (!) 121/90  Pulse: 69 70 88 89  Resp: 20     Temp: 97.9 F (36.6 C) 98.4 F (36.9 C) (!) 97.5 F (36.4 C) 98.3 F (36.8 C)  TempSrc:      SpO2: 95% (!) 88% 96% 100%  Weight:      Height:        Intake/Output Summary (Last 24 hours) at 11/25/2023 1715 Last data filed at 11/25/2023 1300 Gross per 24 hour  Intake 1010 ml  Output 700 ml  Net 310 ml   Filed Weights   11/20/23 0832 11/24/23 0400  Weight: 109.9 kg 106.1 kg    Data Reviewed: I have personally reviewed and interpreted daily labs, tele strips, imagings as discussed above. I reviewed all nursing notes, pharmacy notes, vitals, pertinent old records I have discussed plan of care as described above with RN and patient/family.  CBC: Recent Labs  Lab 11/20/23 0826 11/20/23 1153 11/20/23 1557 11/21/23 0458 11/21/23 1047 11/22/23 0634 11/22/23 1708 11/23/23 0647 11/23/23 1348 11/24/23 0501 11/24/23 1847 11/25/23 0503 11/25/23 1434  WBC 15.0* 14.1*  --  14.2*  --  12.6*  --  11.8*  --  11.4*  --  12.8*  --   NEUTROABS 3.5 5.2  --   --   --   --   --   --   --   --   --   --   --   HGB 11.7* 10.8*   < > 8.2*   < > 8.4*   < > 8.1* 8.5* 7.9* 8.4* 7.8* 8.8*  HCT 37.7 35.4*  --  26.2*  --  26.6*   < > 26.1* 27.3* 25.4* 27.1* 25.5* 28.0*  MCV 97.7 98.9  --  97.4  --  98.2  --  98.5  --  97.7  --  99.6  --   PLT 263 219  --  179  --  173  --  185  --  190  --  212  --    < > = values in this interval  not displayed.   Basic Metabolic Panel: Recent Labs  Lab 11/21/23 0458 11/22/23 0634 11/22/23 0935 11/23/23 0647 11/24/23 0501 11/25/23 0503  NA 142 146*  --  144 144 143  K 3.7 4.0  --  3.7 4.0 3.9  CL 108 111  --  109 110 107  CO2 27 30  --  28  28 31  GLUCOSE 97 111*  --  105* 114* 115*  BUN 19 13  --  10 15 16   CREATININE 0.85 0.96  --  0.88 1.24* 1.31*  CALCIUM  8.2* 8.7*  --  8.4* 8.7* 8.7*  MG  --   --  2.2  --  2.3  --   PHOS  --   --  3.4  --  3.4  --     Studies: No results found.  Scheduled Meds:  DULoxetine   60 mg Oral Daily   escitalopram   20 mg Oral Daily   gabapentin   400 mg Oral QID   pantoprazole   40 mg Oral Daily   sucralfate   1 g Oral Daily   Continuous Infusions:  piperacillin -tazobactam (ZOSYN )  IV 3.375 g (11/25/23 1454)   PRN Meds: acetaminophen , albuterol , artificial tears, fentaNYL  (SUBLIMAZE ) injection, HYDROcodone -acetaminophen , methocarbamol , mouth rinse  Time spent: 55 minutes  Author: ELVAN SOR. MD [facility] 11/25/2023 5:15 PM  To reach On-call, see care teams to locate the attending and reach out to them via [email or web address]. If 7PM-7AM, please contact night-coverage If you still have difficulty reaching the attending provider, please page the [facility] (Director on Call) for [facility] on amion for assistance.

## 2023-11-25 NOTE — Progress Notes (Signed)
 Physical Therapy Treatment Patient Details Name: Jamie Phillips MRN: [id number] DOB: 12/11/1940 Today's Date: 11/25/2023   History of Present Illness 83 y.o. 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  Patient also notes associated abdominal pain with passing clot.  S/p 7/31 embolectomy.    PT Comments  Pt was sitting in recliner upon arrival. She is A and O x4 and remains cooperative throughout session. She demonstrated safe abilities to stand and tolerate ambulation ~ 200 ft total. 1 x 100 ft with RW then 1 x 100 ft with HHA +1 only. Pt does have peak HR elevation to 133 bpm with RR into mid 30 s. She endorses only minimal SOB. Overall, pt tolerated session well and is progressing well towards all PT goals. HHPT recommended at DC to maximize her strength and safety with all ADLs. Acute PT will continue current POC.    If plan is discharge home, recommend the following: A little help with walking and/or transfers;A little help with bathing/dressing/bathroom;Assist for transportation     Equipment Recommendations  Other (comment) (pt endorses having RW/SPC that she only used when out of the home.)       Precautions / Restrictions Precautions Precautions: Fall Recall of Precautions/Restrictions: Intact Restrictions Weight Bearing Restrictions Per Provider Order: No     Mobility  Bed Mobility  In recliner pre/post session Transfers Overall transfer level: Modified independent Equipment used: Rolling walker (2 wheels) Transfers: Sit to/from Stand Sit to Stand: Supervision     Ambulation/Gait Ambulation/Gait assistance: Supervision, Contact guard assist Gait Distance (Feet): 200 Feet Assistive device: Rolling walker (2 wheels), 1 person hand held assist Gait  Pattern/deviations: Step-through pattern Gait velocity: WNL General Gait Details: Pt ambulated 1 x 100 ft RW then 1 x ~ 100 with HHA +1. pt does not use AD in her home however author encouraged pt to use one (RW) until strength and balance returns to her baseline. HR did elevated to 133bpm at max with RR into mid to lower 30s. Pt did not show anyt signs or symptoms of distress. MInimally SOB however author contributes to pt non stop talking throughout all gait activity    Balance Overall balance assessment: Needs assistance Sitting-balance support: Feet supported Sitting balance-Leahy Scale: Normal Sitting balance - Comments: Steady reach within BOS   Standing balance support: Bilateral upper extremity supported Standing balance-Leahy Scale: Good Standing balance comment: Improved balance with use of RW     Communication Communication Communication: No apparent difficulties  Cognition Arousal: Alert Behavior During Therapy: WFL for tasks assessed/performed    Following commands: Intact      Cueing Cueing Techniques: Verbal cues    PT Goals (current goals can now be found in the care plan section) Acute Rehab PT Goals Patient Stated Goal: go home when safe Progress towards PT goals: Progressing toward goals    Frequency    Min 1X/week       AM-PAC PT 6 Clicks Mobility   Outcome Measure  Help needed turning from your back to your side while in a flat bed without using bedrails?: None Help needed moving from lying on your back to sitting on the side of a flat bed without using bedrails?: None Help needed moving to and from a bed to a  chair (including a wheelchair)?: None Help needed standing up from a chair using your arms (e.g., wheelchair or bedside chair)?: A Little Help needed to walk in hospital room?: A Little Help needed climbing 3-5 steps with a railing? : A Little 6 Click Score: 21    End of Session   Activity Tolerance: Patient tolerated treatment  well;Patient limited by fatigue Patient left: in chair;with call bell/phone within reach;with chair alarm set Nurse Communication: Mobility status PT Visit Diagnosis: Muscle weakness (generalized) (M62.81);Difficulty in walking, not elsewhere classified (R26.2)     Time: 8683-8660 PT Time Calculation (min) (ACUTE ONLY): 23 min  Charges:    $Gait Training: 8-22 mins $Therapeutic Activity: 8-22 mins PT General Charges $$ ACUTE PT VISIT: 1 Visit                    83 y.o. PTA 11/25/23, 4:14 PM

## 2023-11-26 ENCOUNTER — Inpatient Hospital Stay

## 2023-11-26 ENCOUNTER — Encounter: Admitting: Registered Nurse

## 2023-11-26 DIAGNOSIS — K922 Gastrointestinal hemorrhage, unspecified: Secondary | ICD-10-CM | POA: Diagnosis not present

## 2023-11-26 LAB — BASIC METABOLIC PANEL WITH GFR
Anion gap: 8 (ref 5–15)
BUN: 18 mg/dL (ref 8–23)
CO2: 28 mmol/L (ref 22–32)
Calcium: 8.6 mg/dL — ABNORMAL LOW (ref 8.9–10.3)
Chloride: 107 mmol/L (ref 98–111)
Creatinine, Ser: 0.97 mg/dL (ref 0.44–1.00)
GFR, Estimated: 59 mL/min — ABNORMAL LOW (ref >60)
Glucose, Bld: 103 mg/dL — ABNORMAL HIGH (ref 70–99)
Potassium: 3.8 mmol/L (ref 3.5–5.1)
Sodium: 143 mmol/L (ref 135–145)

## 2023-11-26 LAB — CBC
HCT: 25.8 % — ABNORMAL LOW (ref 36.0–46.0)
Hemoglobin: 8 g/dL — ABNORMAL LOW (ref 12.0–15.0)
MCH: 30.4 pg (ref 26.0–34.0)
MCHC: 31 g/dL (ref 30.0–36.0)
MCV: 98.1 fL (ref 80.0–100.0)
Platelets: 222 K/uL (ref 150–400)
RBC: 2.63 MIL/uL — ABNORMAL LOW (ref 3.87–5.11)
RDW: 15.1 % (ref 11.5–15.5)
WBC: 11.4 K/uL — ABNORMAL HIGH (ref 4.0–10.5)
nRBC: 0 % (ref 0.0–0.2)

## 2023-11-26 NOTE — Plan of Care (Signed)
  Problem: Education: Goal: Understanding of CV disease, CV risk reduction, and recovery process will improve Outcome: Progressing Goal: Individualized Educational Video(s) Outcome: Progressing   Problem: Activity: Goal: Ability to return to baseline activity level will improve Outcome: Progressing   Problem: Cardiovascular: Goal: Ability to achieve and maintain adequate cardiovascular perfusion will improve Outcome: Progressing Goal: Vascular access site(s) Level 0-1 will be maintained Outcome: Progressing   Problem: Health Behavior/Discharge Planning: Goal: Ability to safely manage health-related needs after discharge will improve Outcome: Progressing   Problem: Education: Goal: Knowledge of General Education information will improve Description: Including pain rating scale, medication(s)/side effects and non-pharmacologic comfort measures Outcome: Progressing   Problem: Health Behavior/Discharge Planning: Goal: Ability to manage health-related needs will improve Outcome: Progressing   Problem: Clinical Measurements: Goal: Ability to maintain clinical measurements within normal limits will improve Outcome: Progressing Goal: Will remain free from infection Outcome: Progressing Goal: Diagnostic test results will improve Outcome: Progressing Goal: Respiratory complications will improve Outcome: Progressing Goal: Cardiovascular complication will be avoided Outcome: Progressing   Problem: Activity: Goal: Risk for activity intolerance will decrease Outcome: Progressing   Problem: Nutrition: Goal: Adequate nutrition will be maintained Outcome: Progressing   Problem: Coping: Goal: Level of anxiety will decrease Outcome: Progressing   Problem: Elimination: Goal: Will not experience complications related to bowel motility Outcome: Progressing Goal: Will not experience complications related to urinary retention Outcome: Progressing   Problem: Pain Managment: Goal:  General experience of comfort will improve and/or be controlled Outcome: Progressing   Problem: Safety: Goal: Ability to remain free from injury will improve Outcome: Progressing   Problem: Skin Integrity: Goal: Risk for impaired skin integrity will decrease Outcome: Progressing   Problem: Education: Goal: Knowledge of disease and its progression will improve Outcome: Progressing Goal: Individualized Educational Video(s) Outcome: Progressing   Problem: Fluid Volume: Goal: Compliance with measures to maintain balanced fluid volume will improve Outcome: Progressing   Problem: Health Behavior/Discharge Planning: Goal: Ability to manage health-related needs will improve Outcome: Progressing   Problem: Nutritional: Goal: Ability to make healthy dietary choices will improve Outcome: Progressing   Problem: Clinical Measurements: Goal: Complications related to the disease process, condition or treatment will be avoided or minimized Outcome: Progressing   Problem: Education: Goal: Knowledge of the prescribed therapeutic regimen will improve Outcome: Progressing   Problem: Activity: Goal: Ability to implement measures to reduce episodes of fatigue will improve Outcome: Progressing   Problem: Bowel/Gastric: Goal: Will not experience complications related to bowel motility Outcome: Progressing   Problem: Coping: Goal: Ability to identify and develop effective coping behavior will improve Outcome: Progressing   Problem: Nutritional: Goal: Maintenance of adequate nutrition will improve Outcome: Progressing

## 2023-11-26 NOTE — Progress Notes (Signed)
 Occupational Therapy Treatment Patient Details Name: Jamie Phillips MRN: [id number] DOB: Feb 22, 1941 Today's Date: 11/26/2023   History of present illness 83 y.o. 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  Patient also notes associated abdominal pain with passing clot.  S/p 7/31 embolectomy.   OT comments  Upon entering the room, pt supine in bed and agreeable to OT intervention. Pt reports urgent need to use bathroom. Pt performing supine >sit with supervision. Pt stands with min guard and use of RW. Pt ambulates with RW to bathroom for toileting needs with min guard. Pt having BM and reports brief is soiled. Soiled brief removed and pt needing assistance to thread clean one on B feet. Pt stands with min guard and needs assistance with peri hygiene and clothing management in standing. Pt then returns to sit at EOB. NT arrives to check vitals while pt is sitting on EOB. OT encouraged pt to sit up in recliner chair for dinner meal. Call bell and all needed items within reach.       If plan is discharge home, recommend the following:  A little help with walking and/or transfers;A little help with bathing/dressing/bathroom;Assistance with cooking/housework   Equipment Recommendations  None recommended by OT       Precautions / Restrictions Precautions Precautions: Fall Recall of Precautions/Restrictions: Intact       Mobility Bed Mobility Overal bed mobility: Modified Independent Bed Mobility: Supine to Sit Rolling: Supervision              Transfers Overall transfer level: Modified independent Equipment used: Rolling walker (2 wheels) Transfers: Sit to/from Stand Sit to Stand: Contact guard assist                 Balance Overall balance assessment:  Needs assistance Sitting-balance support: Feet supported Sitting balance-Leahy Scale: Normal     Standing balance support: Bilateral upper extremity supported, Reliant on assistive device for balance Standing balance-Leahy Scale: Fair                             ADL either performed or assessed with clinical judgement   ADL Overall ADL's : Needs assistance/impaired                     Lower Body Dressing: Moderate assistance;Sit to/from stand Lower Body Dressing Details (indicate cue type and reason): to don/doff brief Toilet Transfer: Ambulation;Rolling walker (2 wheels);Contact guard assist   Toileting- Clothing Manipulation and Hygiene: Sit to/from stand;Maximal assistance Toileting - Clothing Manipulation Details (indicate cue type and reason): assistance for clothing management and hygiene            Extremity/Trunk Assessment Upper Extremity Assessment Upper Extremity Assessment: Generalized weakness   Lower Extremity Assessment Lower Extremity Assessment: Generalized weakness        Vision Patient Visual Report: No change from baseline           Communication Communication Communication: No apparent difficulties   Cognition Arousal: Alert Behavior During Therapy: WFL for tasks assessed/performed Cognition: No apparent impairments  Following commands: Intact        Cueing   Cueing Techniques: Verbal cues  Exercises              Pertinent Vitals/ Pain       Pain Assessment Pain Assessment: No/denies pain         Frequency  Min 2X/week        Progress Toward Goals  OT Goals(current goals can now be found in the care plan section)  Progress towards OT goals: Progressing toward goals      AM-PAC OT 6 Clicks Daily Activity     Outcome Measure   Help from another person eating meals?: None Help from another person taking care of personal grooming?: None Help from  another person toileting, which includes using toliet, bedpan, or urinal?: A Little Help from another person bathing (including washing, rinsing, drying)?: A Little Help from another person to put on and taking off regular upper body clothing?: None Help from another person to put on and taking off regular lower body clothing?: A Little 6 Click Score: 21    End of Session Equipment Utilized During Treatment: Rolling walker (2 wheels)  OT Visit Diagnosis: Unsteadiness on feet (R26.81);Muscle weakness (generalized) (M62.81)   Activity Tolerance Patient tolerated treatment well   Patient Left with call bell/phone within reach;in bed   Nurse Communication Mobility status;Other (comment) (NT in room taking vitals)        Time: 8495-8476 OT Time Calculation (min): 19 min  Charges: OT General Charges $OT Visit: 1 Visit OT Treatments $Self Care/Home Management : 8-22 mins  Izetta Claude, MS, OTR/L , CBIS ascom [phone]  11/26/23, 3:41 PM

## 2023-11-26 NOTE — Progress Notes (Signed)
 PROGRESS NOTE    Jamie Phillips  FMW:[id number] DOB: 1941/02/06 DOA: 11/20/2023 PCP: Lenon Layman ORN, MD  Chief Complaint  Patient presents with   Rectal Bleeding    Hospital Course:  Jamie Phillips is an 83yo female, with history of bilateral pulmonary emboli, right lower extremity DVT in 2017 who has been chronically on Eliquis  since that time.  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  Patient noted abdominal pain with passing blood clots.  In the ED she was found to have stable vital signs and CT bleed scan noted diverticulitis and active GI bleed.  Hemoglobin at 1 point drop from baseline.  On 7/31 patient underwent embolectomy via IR.  Stay has been prolonged due to ongoing bloody bowel movements.  Subjective: This morning patient reports that she is fatigued.  No acute events overnight.   Objective: Vitals:   11/26/23 0001 11/26/23 0500 11/26/23 0751 11/26/23 1054  BP: (!) 98/42 (!) 141/59 132/65 113/66  Pulse: 78 78 72 86  Resp: 18 18 20 20   Temp: 98.2 F (36.8 C) 97.8 F (36.6 C) 97.8 F (36.6 C) 97.8 F (36.6 C)  TempSrc:      SpO2: 94% 97% 91% 95%  Weight:      Height:        Intake/Output Summary (Last 24 hours) at 11/26/2023 1554 Last data filed at 11/26/2023 1300 Gross per 24 hour  Intake 840 ml  Output --  Net 840 ml   Filed Weights   11/20/23 0832 11/24/23 0400  Weight: 109.9 kg 106.1 kg    Examination: General exam: Appears calm and comfortable, NAD  Respiratory system: No work of breathing, symmetric chest wall expansion Cardiovascular system: S1 & S2 heard, RRR.  Gastrointestinal system: Abdomen is nondistended, soft and nontender.  Neuro: Alert and oriented. No focal neurological deficits. Extremities: Symmetric, expected ROM Skin: No rashes, lesions Psychiatry: Demonstrates appropriate  judgement and insight. Mood & affect appropriate for situation.   Assessment & Plan:  Principal Problem:   Lower GI bleed    Lower GI bleed - Diverticular bleed active on CT scan.  Status post angiogram and embolization of small active bleed in sigmoid colon - Patient was on Eliquis  prior to arrival, last dose 7/30. - Status post 1 unit PRBC - Continue monitor hemoglobin - Hemoglobin downtrending now but remains stable.  No bloody bowel movements reported today - Continue to hold anticoagulation  History of bilateral massive pulmonary emboli/right lower extremity DVT 2017 - At time of PE patient underwent thrombolysis and was started on lifelong Eliquis  - Thought to be hypercoagulable in setting of CLL/SLL - Initially plan was to start heparin  low-dose with bolus and then transition to Eliquis  if without recurrent bleed however hemoglobin has been unstable and this has not recurred. - After further discussion with the patient she reports frequent falls and significant cost burden from Eliquis .  She is anxious to get off this medication if able. Will obtain lower extremity Dopplers to ensure not clot burden - We have discussed IVC filter, she is amendable to this - Have consulted vascular surgery for consideration  CKD stage IIIa - At baseline - Continue to monitor creatinine  OSA - CPAP at night  Hyperlipidemia - Statin  GERD - Continue PPI - History of esophageal stricture.  Patient is scheduled for  EGD and dilation 8/15 with GI  Prediabetes - No indication for insulin  at this time  DJD of lumbar spine - Ambulates with walker - PT/OT - Frequent falls  Acute blood loss anemia - Secondary to low GI bleed - Iron profile, folate level, B12 WNL - Continue transfuse as needed  Hypocalcemia Hypoalbuminemia - Vitamin D  within normal range  Prolonged QTc - 7/31: 516 - Improving now.  8/4 EKG QTc 489. - Continue to avoid prolonging medications - 8/5 Lexapro  resumed  at patient request  Depression - Continue home meds  BMI 40 - Obesity class III - Outpatient follow up for lifestyle modification and risk factor management  DVT prophylaxis: none given bleed   Code Status: Full Code Disposition:  Inpatient pending Hgb stabilization.   Consultants:  Treatment Team:  Consulting Physician: Maryruth Ole DASEN, MD  Procedures:    Antimicrobials:  Anti-infectives (From admission, onward)    Start     Dose/Rate Route Frequency Ordered Stop   11/20/23 2000  piperacillin -tazobactam (ZOSYN ) IVPB 3.375 g        3.375 g 100 mL/hr over 30 Minutes Intravenous Every 8 hours 11/20/23 1348     11/20/23 1200  piperacillin -tazobactam (ZOSYN ) IVPB 3.375 g        3.375 g 100 mL/hr over 30 Minutes Intravenous  Once 11/20/23 1147 11/20/23 1232       Data Reviewed: I have personally reviewed following labs and imaging studies CBC: Recent Labs  Lab 11/20/23 0826 11/20/23 1153 11/20/23 1557 11/22/23 0634 11/22/23 1708 11/23/23 0647 11/23/23 1348 11/24/23 0501 11/24/23 1847 11/25/23 0503 11/25/23 1434 11/26/23 0535  WBC 15.0* 14.1*   < > 12.6*  --  11.8*  --  11.4*  --  12.8*  --  11.4*  NEUTROABS 3.5 5.2  --   --   --   --   --   --   --   --   --   --   HGB 11.7* 10.8*   < > 8.4*   < > 8.1*   < > 7.9* 8.4* 7.8* 8.8* 8.0*  HCT 37.7 35.4*   < > 26.6*   < > 26.1*   < > 25.4* 27.1* 25.5* 28.0* 25.8*  MCV 97.7 98.9   < > 98.2  --  98.5  --  97.7  --  99.6  --  98.1  PLT 263 219   < > 173  --  185  --  190  --  212  --  222   < > = values in this interval not displayed.   Basic Metabolic Panel: Recent Labs  Lab 11/22/23 0634 11/22/23 0935 11/23/23 0647 11/24/23 0501 11/25/23 0503 11/26/23 0535  NA 146*  --  144 144 143 143  K 4.0  --  3.7 4.0 3.9 3.8  CL 111  --  109 110 107 107  CO2 30  --  28 28 31 28   GLUCOSE 111*  --  105* 114* 115* 103*  BUN 13  --  10 15 16 18   CREATININE 0.96  --  0.88 1.24* 1.31* 0.97  CALCIUM  8.7*  --  8.4* 8.7*  8.7* 8.6*  MG  --  2.2  --  2.3  --   --   PHOS  --  3.4  --  3.4  --   --    GFR: Estimated Creatinine Clearance: 54.1 mL/min (by C-G formula based on SCr of 0.97 mg/dL). Liver Function Tests: Recent Labs  Lab 11/20/23 0826 11/21/23 0458 11/22/23 0634  AST 13* 11* 19  ALT 10 7 11   ALKPHOS 73 52 57  BILITOT 0.4 0.6 0.4  PROT 5.3* 4.7* 4.8*  ALBUMIN  3.0* 3.1* 3.1*   CBG: Recent Labs  Lab 11/22/23 2137 11/25/23 2015  GLUCAP 160* 134*    No results found for this or any previous visit (from the past 240 hours).   Radiology Studies: No results found.  Scheduled Meds:  DULoxetine   60 mg Oral Daily   escitalopram   20 mg Oral Daily   gabapentin   400 mg Oral QID   pantoprazole   40 mg Oral Daily   saccharomyces boulardii  250 mg Oral BID   sucralfate   1 g Oral Daily   Continuous Infusions:  piperacillin -tazobactam (ZOSYN )  IV 3.375 g (11/26/23 1254)     LOS: 6 days  MDM: Patient is high risk for one or more organ failure.  They necessitate ongoing hospitalization for continued IV therapies and subsequent lab monitoring. Total time spent interpreting labs and vitals, reviewing the medical record, coordinating care amongst consultants and care team members, directly assessing and discussing care with the patient and/or family: 55 min  Jamie Balliet, DO [facility]  To contact the attending physician between 7A-7P please use Epic Chat. To contact the covering physician during after hours 7P-7A, please review Amion.  11/26/2023, 3:54 PM   *This document has been created with the assistance of dictation software. Please excuse typographical errors. *

## 2023-11-26 NOTE — TOC Progression Note (Signed)
 Transition of Care [facility]) - Progression Note    Patient Details  Name: Jamie Phillips MRN: [id number] Date of Birth: [birth year]  [facility] CM/SW Contact  Lauraine JAYSON Carpen, LCSW Phone Number: 11/26/2023, 12:29 PM  Clinical Narrative:  CSW went to talk to patient about home health but she was asleep and did not wake up to CSW calling her name three times. RN will notify CSW when she's more alert.   Expected Discharge Plan: Home w Home Health Services Barriers to Discharge: Continued Medical Work up               Expected Discharge Plan and Services     Post Acute Care Choice: Home Health Living arrangements for the past 2 months: Single Family Home                                       Social Drivers of Health (SDOH) Interventions SDOH Screenings   Food Insecurity: No Food Insecurity (11/20/2023)  Housing: Low Risk  (11/20/2023)  Transportation Needs: No Transportation Needs (11/20/2023)  Utilities: Not At Risk (11/20/2023)  Depression (PHQ2-9): Medium Risk (07/04/2023)  Financial Resource Strain: Low Risk  (01/13/2023)   Received from [facility]  Physical Activity: Unknown (03/18/2017)   Received from [facility]  Social Connections: Moderately Integrated (11/20/2023)  Stress: Unknown (03/18/2017)   Received from [facility]  Tobacco Use: Low Risk  (11/20/2023)    Readmission Risk Interventions     No data to display

## 2023-11-26 NOTE — Consult Note (Signed)
 Hospital Consult    Reason for Consult:  Inferior Vena Cava Filter Placement  Requesting Physician:  Dr Lorane Poland Do  MRN #:  [id number]  History of Present Illness: This is a 83 y.o. female with history of bilateral pulmonary emboli, right lower extremity DVT in 2017 who has been chronically on Eliquis  since that time. 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 Patient noted abdominal pain with passing blood clots.   Patient is no longer able to be anticoagulated.  She has been taken off of her Eliquis .  Vascular surgery consulted for inferior vena cava filter placement.  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)    Macular degeneration    MGUS (monoclonal gammopathy of unknown significance)    Morbid obesity (HCC)    Obesity    Pre-diabetes    Pulmonary embolism (HCC)    PVD (peripheral vascular disease) (HCC)    RLS (restless legs syndrome)    Sleep apnea    CPAP    Past Surgical History:  Procedure Laterality Date   APPENDECTOMY     BREAST BIOPSY Left 08/19/2004   lt bx/clip-neg   BREAST BIOPSY Left 03/05/2023   US  BX heart clip, path pending-   BREAST BIOPSY Left 03/05/2023   US  BX hydromark coil clip axilla node- path pending   BREAST BIOPSY Left 03/05/2023   US  LT BREAST BX W LOC DEV 1ST LESION IMG BX SPEC US  GUIDE 03/05/2023  [facility]   CATARACT EXTRACTION W/PHACO Left 09/10/2022   Procedure: CATARACT EXTRACTION PHACO AND INTRAOCULAR LENS PLACEMENT (IOC) LEFT  5.88  00:31.9;  Surgeon: Jaye Fallow, MD;  Location: [facility];  Service: Ophthalmology;  Laterality: Left;  sleep apnea   COLONOSCOPY     COLONOSCOPY WITH PROPOFOL  N/A 03/10/2017   Procedure: COLONOSCOPY WITH PROPOFOL ;  Surgeon: Gaylyn Gladis PENNER, MD;  Location: [facility] ENDOSCOPY;  Service: Endoscopy;  Laterality: N/A;   DILATION AND CURETTAGE OF UTERUS     ESOPHAGOGASTRODUODENOSCOPY (EGD) WITH PROPOFOL  N/A 02/02/2019   Procedure: ESOPHAGOGASTRODUODENOSCOPY (EGD) WITH PROPOFOL ;  Surgeon: Toledo, Ladell POUR, MD;  Location: [facility] ENDOSCOPY;  Service: Endoscopy;  Laterality: N/A;   EYE SURGERY     IR EMBO ART  VEN HEMORR LYMPH EXTRAV  INC GUIDE ROADMAPPING  11/20/2023   IVC FILTER INSERTION N/A 06/11/2016   Procedure: IVC Filter Insertion;  Surgeon: Cordella KANDICE Shawl, MD;  Location: [facility] INVASIVE CV LAB;  Service: Cardiovascular;  Laterality: N/A;   IVC FILTER REMOVAL N/A 05/06/2017   Procedure: IVC FILTER REMOVAL;  Surgeon: Shawl Cordella KANDICE, MD;  Location: [facility] INVASIVE CV LAB;  Service: Cardiovascular;  Laterality: N/A;   JOINT REPLACEMENT Left 06/17/2016   Knees, pt stated left  and right have been replaced.   LAPAROSCOPIC APPENDECTOMY N/A 10/10/2018   Procedure: APPENDECTOMY LAPAROSCOPIC ATTEMPTED CONVERTED TO OPEN;  Surgeon: Rodolph Romano, MD;  Location: [facility] ORS;  Service: General;  Laterality: N/A;   LAPAROSCOPY N/A 10/10/2018   Procedure: LAPAROSCOPY DIAGNOSTIC ATTEMPTED CONVERTED TO OPEN;  Surgeon: Rodolph Romano, MD;  Location: [facility] ORS;  Service: General;  Laterality: N/A;   LUMBAR LAMINECTOMY/DECOMPRESSION MICRODISCECTOMY N/A 07/09/2021   Procedure: Laminectomy and Foraminotomy - Lumbar Two-Lumbar Three - Lumbar Three-Lumbar Four - Lumbar Four-Lumbar Five;  Surgeon: Louis Shove, MD;  Location: [facility];  Service:  Neurosurgery;  Laterality: N/A;   neck fusion     OPEN REDUCTION INTERNAL FIXATION (ORIF) WRIST WITH ILIAC CREST BONE GRAFT Left    PARTIAL COLECTOMY Right 10/10/2018   Procedure: PARTIAL COLECTOMY;  Surgeon: Rodolph Romano, MD;  Location: [facility] ORS;  Service: General;  Laterality: Right;   PERIPHERAL VASCULAR CATHETERIZATION N/A 08/15/2015   Procedure: pulmonary angiogram with lysis;  Surgeon: Cordella KANDICE Shawl, MD;  Location: [facility] INVASIVE CV LAB;  Service: Cardiovascular;  Laterality: N/A;   WRIST SURGERY Left 2000    No Known Allergies  Prior to Admission medications   Medication Sig Start Date End Date Taking? Authorizing Provider  acetaminophen  (TYLENOL ) 650 MG CR tablet Take 650-1,300 mg by mouth every 8 (eight) hours as needed for pain. TYLENOL  ARTHRITIS   Yes [provider]  apixaban  (ELIQUIS ) 5 MG TABS tablet Take 1 tablet (5 mg total) by mouth 2 (two) times daily. 09/28/21  Yes Brahmanday, Govinda R, MD  b complex vitamins capsule Take 1 capsule by mouth daily.   Yes [provider]  calcium  carbonate (OSCAL) 1500 (600 Ca) MG TABS tablet Take 1,500 mg by mouth 2 (two) times daily with a meal.   Yes [provider]  Cholecalciferol (VITAMIN D -3) 125 MCG (5000 UT) TABS Take by mouth daily.   Yes [provider]  DULoxetine  (CYMBALTA ) 60 MG capsule Take 1 capsule (60 mg total) by mouth daily. With Other SSRI 03/31/23  Yes Lovorn, Megan, MD  escitalopram  (LEXAPRO ) 20 MG tablet Take 1 tablet by mouth daily. 05/20/22  Yes [provider]  gabapentin  (NEURONTIN ) 400 MG capsule Take 1 capsule (400 mg total) by mouth 4 (four) times daily. Take 1 pill in Am and 2 pills at night- for nerve pain 03/31/23  Yes Lovorn, Megan, MD  HYDROcodone -acetaminophen  (NORCO) 10-325 MG tablet Take 1 tablet by mouth every 6 (six) hours as needed. 09/03/23  Yes Debby Fidela CROME, NP  losartan -hydrochlorothiazide  (HYZAAR) 100-12.5 MG tablet Take 0.5 tablets by mouth  daily. Reported on 08/22/2015 06/18/21  Yes Berkeley Adelita PENNER, MD  magnesium  gluconate (MAGONATE) 500 MG tablet Take 500 mg by mouth 2 (two) times daily.   Yes [provider]  methocarbamol  (ROBAXIN ) 500 MG tablet TAKE 1 TABLET (500 MG TOTAL) EVERY 8 (EIGHT) HOURS AS NEEDED FOR MUSCLE SPASMS. 05/23/23  Yes Lovorn, Megan, MD  Multiple Vitamins-Minerals (PRESERVISION AREDS 2 PO) Take 1 tablet by mouth in the morning and at bedtime.   Yes [provider]  pantoprazole  (PROTONIX ) 40 MG tablet  10/25/23  Yes [provider]  pravastatin  (PRAVACHOL ) 40 MG tablet Take 1 tablet by mouth daily. 06/27/21  Yes [provider]  propranolol  (INDERAL ) 20 MG tablet Take 1 tablet by mouth 2 (two) times daily. 08/14/23  Yes [provider]  sucralfate  (CARAFATE ) 1 g tablet Take 1 g by mouth daily.   Yes [provider]  torsemide (DEMADEX) 20 MG tablet  07/29/23  Yes [provider]  TURMERIC CURCUMIN PO Take 1 capsule by mouth in the morning and at bedtime.   Yes [provider]  cholestyramine  (QUESTRAN ) 4 g packet Take 1 packet (4 g total) by mouth 3 (three) times daily with meals. 04/30/21   Berkeley Adelita PENNER, MD    Social History   Socioeconomic History   Marital status: Widowed    Spouse name: Not on file   Number of children: Not on file   Years of education: Not on file   Highest education level: Not on file  Occupational History   Occupation: Retired  Tobacco Use   Smoking status: Never   Smokeless tobacco: Never  Vaping Use   Vaping status: Never Used  Substance and Sexual Activity   Alcohol  use: No    Alcohol /week: 0.0 standard drinks of alcohol    Drug use: No   Sexual activity: Not on file  Other Topics Concern   Not on file  Social History Narrative   ** Merged History Encounter **       Social Drivers of Health   Financial Resource Strain: Low Risk  (01/13/2023)   Received from [facility]    Overall Financial Resource Strain (CARDIA)    Difficulty of Paying Living Expenses: Not hard at all  Food Insecurity: No Food Insecurity (11/20/2023)   Hunger Vital Sign    Worried About Running Out of Food in the Last Year: Never true    Ran Out of Food in the Last Year: Never true  Transportation Needs: No Transportation Needs (11/20/2023)   PRAPARE - Administrator, Civil Service (Medical): No    Lack of Transportation (Non-Medical): No  Physical Activity: Unknown (03/18/2017)   Received from [facility]   Exercise Vital Sign    Days of Exercise per Week: Patient declined    Minutes of Exercise per Session: Patient declined  Stress: Unknown (03/18/2017)   Received from [facility] - Occupational Stress Questionnaire    Feeling of Stress : Patient declined  Social Connections: Moderately Integrated (11/20/2023)   Social Connection and Isolation Panel    Frequency of Communication with Friends and Family: Three times a week    Frequency of Social Gatherings with Friends and Family: Three times a week    Attends Religious Services: 1 to 4 times per year    Active Member of Clubs or Organizations: No    Attends Banker Meetings: 1 to 4 times per year    Marital Status: Widowed  Intimate Partner Violence: Not At Risk (11/20/2023)   Humiliation, Afraid, Rape, and Kick questionnaire    Fear of Current or Ex-Partner: No    Emotionally Abused: No    Physically Abused: No    Sexually Abused: No     Family History  Problem Relation Age of Onset   Pulmonary embolism Mother    Sudden death Mother    Obesity Mother    Heart failure Father    Congestive Heart Failure Father    Sudden death Father    Thyroid  cancer Brother    Breast cancer Neg Hx     ROS: Otherwise negative unless mentioned in HPI  Physical Examination  Vitals:   11/26/23 0751 11/26/23 1054  BP: 132/65 113/66   Pulse: 72 86  Resp: 20 20  Temp: 97.8 F (36.6 C) 97.8  F (36.6 C)  SpO2: 91% 95%   Body mass index is 40.15 kg/m.  General:  WDWN in NAD Gait: Not observed HENT: WNL, normocephalic Pulmonary: normal non-labored breathing, without Rales, rhonchi,  wheezing Cardiac: regular, without  Murmurs, rubs or gallops; without carotid bruits Abdomen: Positive bowel sounds throughout, soft, NT/ND, no masses Skin: without rashes Vascular Exam/Pulses: Palpable pulses throughout.  Extremities: without ischemic changes, without Gangrene , without cellulitis; without open wounds;  Musculoskeletal: no muscle wasting or atrophy  Neurologic: A&O X 3;  No focal weakness or paresthesias are detected; speech is fluent/normal Psychiatric:  The pt has Normal affect. Lymph:  Unremarkable  CBC    Component Value Date/Time   WBC 11.4 (H) 11/26/2023 0535   RBC 2.63 (L) 11/26/2023 0535   HGB 8.0 (L) 11/26/2023 0535   HGB 12.2 11/18/2023 1012   HGB 13.3 07/28/2014 0950   HCT 25.8 (L) 11/26/2023 0535   HCT 41.5 07/28/2014 0950   PLT 222 11/26/2023 0535   PLT 217 11/18/2023 1012   PLT 219 07/28/2014 0950   MCV 98.1 11/26/2023 0535   MCV 92 07/28/2014 0950   MCH 30.4 11/26/2023 0535   MCHC 31.0 11/26/2023 0535   RDW 15.1 11/26/2023 0535   RDW 14.9 (H) 07/28/2014 0950   LYMPHSABS 8.0 (H) 11/20/2023 1153   LYMPHSABS 17.7 (H) 07/28/2014 0950   MONOABS 0.7 11/20/2023 1153   MONOABS 0.8 07/28/2014 0950   EOSABS 0.2 11/20/2023 1153   EOSABS 0.4 07/28/2014 0950   BASOSABS 0.1 11/20/2023 1153   BASOSABS 0.1 07/28/2014 0950    BMET    Component Value Date/Time   NA 143 11/26/2023 0535   NA 142 12/26/2020 1112   NA 141 04/29/2012 1822   K 3.8 11/26/2023 0535   K 3.9 04/29/2012 1822   CL 107 11/26/2023 0535   CL 107 04/29/2012 1822   CO2 28 11/26/2023 0535   CO2 28 04/29/2012 1822   GLUCOSE 103 (H) 11/26/2023 0535   GLUCOSE 129 (H) 04/29/2012 1822   BUN 18 11/26/2023 0535   BUN 15 12/26/2020  1112   BUN 21 (H) 04/29/2012 1822   CREATININE 0.97 11/26/2023 0535   CREATININE 1.12 (H) 11/18/2023 1012   CREATININE 1.02 (H) 07/28/2014 0950   CALCIUM  8.6 (L) 11/26/2023 0535   CALCIUM  8.7 04/29/2012 1822   GFRNONAA 59 (L) 11/26/2023 0535   GFRNONAA 49 (L) 11/18/2023 1012   GFRNONAA 55 (L) 07/28/2014 0950   GFRAA 55 (L) 09/03/2019 0830   GFRAA >60 07/28/2014 0950    COAGS: Lab Results  Component Value Date   INR 1.2 11/20/2023   INR 1.0 06/16/2020   INR 1.3 (H) 10/12/2018     Non-Invasive Vascular Imaging:   EXAM:11/20/23 CTA ABDOMEN AND PELVIS WITHOUT AND WITH CONTRAST   TECHNIQUE: Multidetector CT imaging of the abdomen and pelvis was performed using the standard protocol during bolus administration of intravenous contrast. Multiplanar reconstructed images and MIPs were obtained and reviewed to evaluate the vascular anatomy.   RADIATION DOSE REDUCTION: This exam was performed according to the departmental dose-optimization program which includes automated exposure control, adjustment of the mA and/or kV according to patient size and/or use of iterative reconstruction technique.   CONTRAST:  OMNIPAQUE  IOHEXOL  350 MG/ML SOLN   COMPARISON:  CT scan abdomen and pelvis from 10/14/2018.   FINDINGS: VASCULAR   Aorta: Normal caliber aorta without aneurysm, dissection, vasculitis or significant stenosis.   Celiac: Patent without evidence of aneurysm, dissection, vasculitis or  significant stenosis.   SMA: Patent without evidence of aneurysm, dissection, vasculitis or significant stenosis.   Renals: Both renal arteries are patent without evidence of aneurysm, dissection, vasculitis, fibromuscular dysplasia or significant stenosis.   IMA: Patent without evidence of aneurysm, dissection, vasculitis or significant stenosis.   Inflow: Patent without evidence of aneurysm, dissection, vasculitis or significant stenosis.   Proximal Outflow: Bilateral common  femoral and visualized portions of the superficial and profunda femoral arteries are patent without evidence of aneurysm, dissection, vasculitis or significant stenosis.   Veins: No obvious venous abnormality within the limitations of this arterial phase study.   Review of the MIP images confirms the above findings.   NON-VASCULAR   Lower chest: There are patchy atelectatic changes in the visualized lung bases. No overt consolidation. No pleural effusion. The heart is normal in size. No pericardial effusion.   Hepatobiliary: The liver is normal in size. Non-cirrhotic configuration. No suspicious mass. No intrahepatic or extrahepatic bile duct dilation. Moderate-to-large volume gallstones noted without imaging signs of acute cholecystitis. Normal gallbladder wall thickness. No pericholecystic inflammatory changes.   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 Otherwise unremarkable pancreas. No pancreatic ductal dilatation or surrounding inflammatory changes.   Spleen: Within normal limits. No focal lesion.   Adrenals/Urinary Tract: Adrenal glands are unremarkable. No suspicious renal mass. There is a predominantly exophytic cyst arising from the right kidney lower pole, laterally measuring 3.4 x 4.0 cm, which has significantly increased in size since the prior study however, no interval development of aggressive features. No routine follow-up is recommended. No nephroureterolithiasis or obstructive uropathy bladder wall trabeculations and pseudo diverticula noted, suggesting sequela of chronic urinary outflow obstruction.   Stomach/Bowel: Postsurgical changes from prior right hemicolectomy noted. Ileocolonic anastomosis noted in the right upper quadrant, anteriorly. No disproportionate dilation of the small or large bowel loops. Liquid unformed stool noted in the left hemicolon. 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 In this region (as seen on series 26, images 131-134 and series 14, images 52-57), there is 3 centric hyperattenuating area seen on the arterial phase images, which increases in size/volume on the portal venous phase images, compatible with active GI bleeding, likely related to diverticulitis.   There are additional, 2 well-circumscribed hyperattenuating foci in the distal sigmoid colon (series 4, image 68), which changes position on different phases of exam and are favored to represent stool material.   Vascular/Lymphatic: No ascites or pneumoperitoneum. There are multiple mildly enlarged retroperitoneal and right upper quadrant mesenteric lymph nodes which have mildly increased in size since the prior study from 2020. These are indeterminate in etiology but favored benign/reactive in the given clinical setting. Attention on follow-up examination is recommended.   Reproductive: Not well evaluated on the current exam. However, note is made of a lobulated uterus. There is an approximately 3.0 x 3.4 cm lesion in the right side of the fundus of the uterus, favored to represent a leiomyoma. There also additional several calcified leiomyomas. There is a well-circumscribed oval 2.6 x 3.2 cm fluid attenuation structure in the left adnexa, favored to represent ovarian in etiology. This is favored to represent senescent ovarian cyst considering, it is essentially unchanged since the prior study from 2020; however, remains incompletely characterized. No large right adnexal mass seen.  Other: Redemonstration of moderate sized midline/left paramedian periumbilical ventral hernia containing short loop of nondilated transverse colon and fat. The soft tissues  and abdominal wall are otherwise unremarkable.   Musculoskeletal: No suspicious osseous lesions. There are moderate multilevel degenerative changes in the visualized spine.   IMPRESSION: 1. Findings favor smoldering diverticulitis at the junction of distal descending colon/proximal sigmoid colon, as described in detail above. There is active GI bleeding in this region, favored diverticular in origin. 2. Interval enlargement of multiple retroperitoneal and right upper quadrant mesenteric lymph nodes, as discussed in detail above. Attention on follow-up examination is recommended. 3. 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  Statin:  Yes.   Beta Blocker:  No. Aspirin :  No. ACEI:  No. ARB:  Yes.   CCB use:  No Other antiplatelets/anticoagulants:  Yes.   Eliquis  5 mg BID    ASSESSMENT/PLAN: This is a 83 y.o. female with a history of bilateral pulmonary emboli and right lower extremity DVT back in 2017, who has been chronically on Eliquis  since that time now has GI bleeding.  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  She presents to [facility] emergency department on 11/20/2023 after GI bleeding noted.  She woke up a pool of blood.  Vascular surgery was consulted to place an IVC filter as she is no longer deemed a candidate to be taking anticoagulation therapy.  Vascular surgery plans on taking the patient to the vascular lab today on 11/27/2023 for an inferior vena cava filter placement.  I discussed in detail at the bedside this morning with the patient the procedure, benefits, risk, and complications.  Patient verbalized understanding and wishes to proceed.  Patient has been n.p.o. since midnight last night.  Patient is currently not on any anticoagulation.   -I  discussed the case in detail with Dr. Selinda Gu MD and he agrees with the plan.   Gwendlyn JONELLE Shank [facility] 11/26/2023 4:52 PM

## 2023-11-26 NOTE — Progress Notes (Unsigned)
 Subjective:    Patient ID: Jamie Phillips, female    DOB: 09/21/1940, 83 y.o.   MRN: [id number]  HPI   Pain Inventory Average Pain {NUMBERS; 0-10:5044} Pain Right Now {NUMBERS; 0-10:5044} My pain is {PAIN DESCRIPTION:21022940}  In the last 24 hours, has pain interfered with the following? General activity {NUMBERS; 0-10:5044} Relation with others {NUMBERS; 0-10:5044} Enjoyment of life {NUMBERS; 0-10:5044} What TIME of day is your pain at its worst? {time of day:24191} Sleep (in general) {BHH GOOD/FAIR/POOR:22877}  Pain is worse with: {ACTIVITIES:21022942} Pain improves with: {PAIN IMPROVES TPUY:78977056} Relief from Meds: {NUMBERS; 0-10:5044}  Family History  Problem Relation Age of Onset   Pulmonary embolism Mother    Sudden death Mother    Obesity Mother    Heart failure Father    Congestive Heart Failure Father    Sudden death Father    Thyroid  cancer Brother    Breast cancer Neg Hx    Social History   Socioeconomic History   Marital status: Widowed    Spouse name: Not on file   Number of children: Not on file   Years of education: Not on file   Highest education level: Not on file  Occupational History   Occupation: Retired  Tobacco Use   Smoking status: Never   Smokeless tobacco: Never  Vaping Use   Vaping status: Never Used  Substance and Sexual Activity   Alcohol  use: No    Alcohol /week: 0.0 standard drinks of alcohol    Drug use: No   Sexual activity: Not on file  Other Topics Concern   Not on file  Social History Narrative   ** Merged History Encounter **       Social Drivers of Health   Financial Resource Strain: Low Risk  (01/13/2023)   Received from [facility]   Overall Financial Resource Strain (CARDIA)    Difficulty of Paying Living Expenses: Not hard at all  Food Insecurity: No Food Insecurity (11/20/2023)   Hunger Vital Sign    Worried About Running Out of Food in the Last Year: Never true    Ran Out of Food in  the Last Year: Never true  Transportation Needs: No Transportation Needs (11/20/2023)   PRAPARE - Administrator, Civil Service (Medical): No    Lack of Transportation (Non-Medical): No  Physical Activity: Unknown (03/18/2017)   Received from [facility]   Exercise Vital Sign    Days of Exercise per Week: Patient declined    Minutes of Exercise per Session: Patient declined  Stress: Unknown (03/18/2017)   Received from [facility] - Occupational Stress Questionnaire    Feeling of Stress : Patient declined  Social Connections: Moderately Integrated (11/20/2023)   Social Connection and Isolation Panel    Frequency of Communication with Friends and Family: Three times a week    Frequency of Social Gatherings with Friends and Family: Three times a week    Attends Religious Services: 1 to 4 times per year    Active Member of Clubs or Organizations: No    Attends Banker Meetings: 1 to 4 times per year    Marital Status: Widowed   Past Surgical History:  Procedure Laterality Date   APPENDECTOMY     BREAST BIOPSY Left 08/19/2004   lt bx/clip-neg   BREAST BIOPSY Left 03/05/2023   US  BX heart clip, path pending-   BREAST BIOPSY Left 03/05/2023  US  BX hydromark coil clip axilla node- path pending   BREAST BIOPSY Left 03/05/2023   US  LT BREAST BX W LOC DEV 1ST LESION IMG BX SPEC US  GUIDE 03/05/2023 [facility]   CATARACT EXTRACTION W/PHACO Left 09/10/2022   Procedure: CATARACT EXTRACTION PHACO AND INTRAOCULAR LENS PLACEMENT (IOC) LEFT  5.88  00:31.9;  Surgeon: Jaye Fallow, MD;  Location: [facility];  Service: Ophthalmology;  Laterality: Left;  sleep apnea   COLONOSCOPY     COLONOSCOPY WITH PROPOFOL  N/A 03/10/2017   Procedure: COLONOSCOPY WITH PROPOFOL ;  Surgeon: Gaylyn Gladis PENNER, MD;  Location: [facility] ENDOSCOPY;  Service: Endoscopy;  Laterality: N/A;   DILATION AND  CURETTAGE OF UTERUS     ESOPHAGOGASTRODUODENOSCOPY (EGD) WITH PROPOFOL  N/A 02/02/2019   Procedure: ESOPHAGOGASTRODUODENOSCOPY (EGD) WITH PROPOFOL ;  Surgeon: Toledo, Ladell POUR, MD;  Location: [facility] ENDOSCOPY;  Service: Endoscopy;  Laterality: N/A;   EYE SURGERY     IR EMBO ART  VEN HEMORR LYMPH EXTRAV  INC GUIDE ROADMAPPING  11/20/2023   IVC FILTER INSERTION N/A 06/11/2016   Procedure: IVC Filter Insertion;  Surgeon: Cordella KANDICE Shawl, MD;  Location: [facility] INVASIVE CV LAB;  Service: Cardiovascular;  Laterality: N/A;   IVC FILTER REMOVAL N/A 05/06/2017   Procedure: IVC FILTER REMOVAL;  Surgeon: Shawl Cordella KANDICE, MD;  Location: [facility]VAS[facility] CV LAB;  Service: Cardiovascular;  Laterality: N/A;   JOINT REPLACEMENT Left 06/17/2016   Knees, pt stated left and right have been replaced.   LAPAROSCOPIC APPENDECTOMY N/A 10/10/2018   Procedure: APPENDECTOMY LAPAROSCOPIC ATTEMPTED CONVERTED TO OPEN;  Surgeon: Rodolph Romano, MD;  Location: [facility];  Service: General;  Laterality: N/A;   LAPAROSCOPY N/A 10/10/2018   Procedure: LAPAROSCOPY DIAGNOSTIC ATTEMPTED CONVERTED TO OPEN;  Surgeon: Rodolph Romano, MD;  Location: [facility];  Service: General;  Laterality: N/A;   LUMBAR LAMINECTOMY/DECOMPRESSION MICRODISCECTOMY N/A 07/09/2021   Procedure: Laminectomy and Foraminotomy - Lumbar Two-Lumbar Three - Lumbar Three-Lumbar Four - Lumbar Four-Lumbar Five;  Surgeon: Louis Shove, MD;  [facility];  Service: Neurosurgery;  Laterality: N/A;   neck fusion     OPEN REDUCTION INTERNAL FIXATION (ORIF) WRIST WITH ILIAC CREST BONE GRAFT Left    PARTIAL COLECTOMY Right 10/10/2018   Procedure: PARTIAL COLECTOMY;  Surgeon: Rodolph Romano, MD;  Location: [facility];  Service: General;  Laterality: Right;   PERIPHERAL VASCULAR CATHETERIZATION N/A 08/15/2015   Procedure: pulmonary angiogram with lysis;  Surgeon: Cordella KANDICE Shawl, MD;  Location: [facility] INVASIVE CV LAB;  Service: Cardiovascular;  Laterality:  N/A;   WRIST SURGERY Left 2000   Past Surgical History:  Procedure Laterality Date   APPENDECTOMY     BREAST BIOPSY Left 08/19/2004   lt bx/clip-neg   BREAST BIOPSY Left 03/05/2023   US  BX heart clip, path pending-   BREAST BIOPSY Left 03/05/2023   US  BX hydromark coil clip axilla node- path pending   BREAST BIOPSY Left 03/05/2023   US  LT BREAST BX W LOC DEV 1ST LESION IMG BX SPEC US  GUIDE 03/05/2023 [facility]   CATARACT EXTRACTION W/PHACO Left 09/10/2022   Procedure: CATARACT EXTRACTION PHACO AND INTRAOCULAR LENS PLACEMENT (IOC) LEFT  5.88  00:31.9;  Surgeon: Jaye Fallow, MD;  Location: [facility];  Service: Ophthalmology;  Laterality: Left;  sleep apnea   COLONOSCOPY     COLONOSCOPY WITH PROPOFOL  N/A 03/10/2017   Procedure: COLONOSCOPY WITH PROPOFOL ;  Surgeon: Gaylyn Gladis PENNER, MD;  Location: [facility]COPY;  Service: Endoscopy;  Laterality: N/A;   DILATION AND CURETTAGE OF UTERUS  ESOPHAGOGASTRODUODENOSCOPY (EGD) WITH PROPOFOL  N/A 02/02/2019   Procedure: ESOPHAGOGASTRODUODENOSCOPY (EGD) WITH PROPOFOL ;  Surgeon: Toledo, Ladell POUR, MD;  Location: [facility];  Service: Endoscopy;  Laterality: N/A;   EYE SURGERY     IR EMBO ART  VEN HEMORR LYMPH EXTRAV  INC GUIDE ROADMAPPING  11/20/2023   IVC FILTER INSERTION N/A 06/11/2016   Procedure: IVC Filter Insertion;  Surgeon: Cordella KANDICE Shawl, MD;  Location: [facility];  Service: Cardiovascular;  Laterality: N/A;   IVC FILTER REMOVAL N/A 05/06/2017   Procedure: IVC FILTER REMOVAL;  Surgeon: Shawl Cordella KANDICE, MD;  Location: [facility];  Service: Cardiovascular;  Laterality: N/A;   JOINT REPLACEMENT Left 06/17/2016   Knees, pt stated left and right have been replaced.   LAPAROSCOPIC APPENDECTOMY N/A 10/10/2018   Procedure: APPENDECTOMY LAPAROSCOPIC ATTEMPTED CONVERTED TO OPEN;  Surgeon: Rodolph Romano, MD;  Location: [facility];  Service: General;  Laterality: N/A;   LAPAROSCOPY N/A  10/10/2018   Procedure: LAPAROSCOPY DIAGNOSTIC ATTEMPTED CONVERTED TO OPEN;  Surgeon: Rodolph Romano, MD;  Location: [facility];  Service: General;  Laterality: N/A;   LUMBAR LAMINECTOMY/DECOMPRESSION MICRODISCECTOMY N/A 07/09/2021   Procedure: Laminectomy and Foraminotomy - Lumbar Two-Lumbar Three - Lumbar Three-Lumbar Four - Lumbar Four-Lumbar Five;  Surgeon: Louis Shove, MD;  Location[facility];  Service: Neurosurgery;  Laterality: N/A;   neck fusion     OPEN REDUCTION INTERNAL FIXATION (ORIF) WRIST WITH ILIAC CREST BONE GRAFT Left    PARTIAL COLECTOMY Right 10/10/2018   Procedure: PARTIAL COLECTOMY;  Surgeon: Rodolph Romano, MD;  Location: [facility];  Service: General;  Laterality: Right;   PERIPHERAL VASCULAR CATHETERIZATION N/A 08/15/2015   Procedure: pulmonary angiogram with lysis;  Surgeon: Cordella KANDICE Shawl, MD;  Location: [facility] INVASIVE CV LAB;  Service: Cardiovascular;  Laterality: N/A;   WRIST SURGERY Left 2000   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  Opioid Risk Score:   Fall Risk Score:  `1  Depression screen PHQ 2/9     07/04/2023    2:03 PM 03/31/2023    2:03 PM 09/23/2022    1:07 PM  06/24/2022    1:46 PM 03/11/2022    1:37 PM 11/16/2021   11:18 AM 11/05/2021   10:42 AM  Depression screen PHQ 2/9  Decreased Interest 3 0 0 1 0 2 0  Down, Depressed, Hopeless 3 0 0 1 0 0 0  PHQ - 2 Score 6 0 0 2 0 2  0  Altered sleeping      1   Tired, decreased energy      1   Change in appetite      0   Feeling bad or failure about yourself       0   Trouble concentrating      0   Moving slowly or fidgety/restless      0   Suicidal thoughts      0   PHQ-9 Score      4   Difficult doing work/chores      Very difficult     Review of Systems     Objective:   Physical Exam        Assessment & Plan:

## 2023-11-27 ENCOUNTER — Encounter: Payer: Self-pay | Admitting: Vascular Surgery

## 2023-11-27 ENCOUNTER — Encounter
Admission: EM | Disposition: A | Discharge status: Home Health | Payer: Self-pay | Source: Home / Self Care | Attending: Student

## 2023-11-27 DIAGNOSIS — Z7901 Long term (current) use of anticoagulants: Secondary | ICD-10-CM

## 2023-11-27 DIAGNOSIS — Z86718 Personal history of other venous thrombosis and embolism: Secondary | ICD-10-CM

## 2023-11-27 DIAGNOSIS — K922 Gastrointestinal hemorrhage, unspecified: Secondary | ICD-10-CM | POA: Diagnosis not present

## 2023-11-27 HISTORY — PX: IVC FILTER INSERTION: CATH118245

## 2023-11-27 LAB — CBC WITH DIFFERENTIAL/PLATELET
Abs Immature Granulocytes: 0.03 K/uL (ref 0.00–0.07)
Basophils Absolute: 0.1 K/uL (ref 0.0–0.1)
Basophils Relative: 1 %
Eosinophils Absolute: 0.3 K/uL (ref 0.0–0.5)
Eosinophils Relative: 2 %
HCT: 26.2 % — ABNORMAL LOW (ref 36.0–46.0)
Hemoglobin: 8.1 g/dL — ABNORMAL LOW (ref 12.0–15.0)
Immature Granulocytes: 0 %
Lymphocytes Relative: 64 %
Lymphs Abs: 7.7 K/uL — ABNORMAL HIGH (ref 0.7–4.0)
MCH: 30.1 pg (ref 26.0–34.0)
MCHC: 30.9 g/dL (ref 30.0–36.0)
MCV: 97.4 fL (ref 80.0–100.0)
Monocytes Absolute: 0.8 K/uL (ref 0.1–1.0)
Monocytes Relative: 6 %
Neutro Abs: 3.3 K/uL (ref 1.7–7.7)
Neutrophils Relative %: 27 %
Platelets: 250 K/uL (ref 150–400)
RBC: 2.69 MIL/uL — ABNORMAL LOW (ref 3.87–5.11)
RDW: 15 % (ref 11.5–15.5)
Smear Review: NORMAL
WBC: 12.1 K/uL — ABNORMAL HIGH (ref 4.0–10.5)
nRBC: 0 % (ref 0.0–0.2)

## 2023-11-27 LAB — COMPREHENSIVE METABOLIC PANEL WITH GFR
ALT: 12 U/L (ref 0–44)
AST: 14 U/L — ABNORMAL LOW (ref 15–41)
Albumin: 3 g/dL — ABNORMAL LOW (ref 3.5–5.0)
Alkaline Phosphatase: 54 U/L (ref 38–126)
Anion gap: 10 (ref 5–15)
BUN: 23 mg/dL (ref 8–23)
CO2: 26 mmol/L (ref 22–32)
Calcium: 8.8 mg/dL — ABNORMAL LOW (ref 8.9–10.3)
Chloride: 107 mmol/L (ref 98–111)
Creatinine, Ser: 1.21 mg/dL — ABNORMAL HIGH (ref 0.44–1.00)
GFR, Estimated: 45 mL/min — ABNORMAL LOW (ref >60)
Glucose, Bld: 110 mg/dL — ABNORMAL HIGH (ref 70–99)
Potassium: 4.2 mmol/L (ref 3.5–5.1)
Sodium: 143 mmol/L (ref 135–145)
Total Bilirubin: 0.6 mg/dL (ref 0.0–1.2)
Total Protein: 5.2 g/dL — ABNORMAL LOW (ref 6.5–8.1)

## 2023-11-27 LAB — PHOSPHORUS: Phosphorus: 4 mg/dL (ref 2.5–4.6)

## 2023-11-27 LAB — MAGNESIUM: Magnesium: 2.1 mg/dL (ref 1.7–2.4)

## 2023-11-27 SURGERY — IVC FILTER INSERTION
Anesthesia: Moderate Sedation

## 2023-11-27 MED ORDER — CEFAZOLIN SODIUM-DEXTROSE 2-4 GM/100ML-% IV SOLN
INTRAVENOUS | Status: AC
  Filled 2023-11-27: qty 100

## 2023-11-27 MED ORDER — LIDOCAINE-EPINEPHRINE (PF) 1 %-1:200000 IJ SOLN
INTRAMUSCULAR | Status: DC | PRN
  Administered 2023-11-27: 10 mL

## 2023-11-27 MED ORDER — MIDAZOLAM HCL 2 MG/2ML IJ SOLN
INTRAMUSCULAR | Status: DC | PRN
  Administered 2023-11-27: 1 mg via INTRAVENOUS

## 2023-11-27 MED ORDER — SODIUM CHLORIDE 0.9 % IV SOLN: INTRAVENOUS | Status: DC

## 2023-11-27 MED ORDER — HEPARIN (PORCINE) IN NACL 1000-0.9 UT/500ML-% IV SOLN
INTRAVENOUS | Status: DC | PRN
  Administered 2023-11-27: 500 mL

## 2023-11-27 MED ORDER — FENTANYL CITRATE (PF) 100 MCG/2ML IJ SOLN
INTRAMUSCULAR | Status: AC
Start: 2023-11-27 — End: 2023-11-27
  Filled 2023-11-27: qty 2

## 2023-11-27 MED ORDER — IODIXANOL 320 MG/ML IV SOLN
INTRAVENOUS | Status: DC | PRN
  Administered 2023-11-27: 10 mL via INTRAVENOUS

## 2023-11-27 MED ORDER — FENTANYL CITRATE (PF) 100 MCG/2ML IJ SOLN
INTRAMUSCULAR | Status: DC | PRN
  Administered 2023-11-27: 50 ug via INTRAVENOUS

## 2023-11-27 MED ORDER — CEFAZOLIN SODIUM-DEXTROSE 2-4 GM/100ML-% IV SOLN
2.0000 g | INTRAVENOUS | Status: AC
  Administered 2023-11-27: 2 g via INTRAVENOUS

## 2023-11-27 MED ORDER — MIDAZOLAM HCL 2 MG/2ML IJ SOLN
INTRAMUSCULAR | Status: AC
Start: 2023-11-27 — End: 2023-11-27
  Filled 2023-11-27: qty 2

## 2023-11-27 SURGICAL SUPPLY — 4 items
COVER PROBE U/S 5X48 (MISCELLANEOUS) IMPLANT
KIT FEM OPTION ELITE FILTER (Filter) IMPLANT
PACK ANGIOGRAPHY (CUSTOM PROCEDURE TRAY) ×2 IMPLANT
WIRE SUPRACORE 190CM (WIRE) IMPLANT

## 2023-11-27 NOTE — TOC Progression Note (Signed)
 Transition of Care [facility]) - Progression Note    Patient Details  Name: Jamie Phillips MRN: [id number] Date of Birth: 1940/04/28  Transition of [facility]) CM/SW Contact  Lauraine JAYSON Carpen, LCSW Phone Number: 11/27/2023, 3:55 PM  Clinical Narrative:  Reviewed the home health list. The only one she was familiar with was [facility]. They accepted the referral for PT and OT.   Expected Discharge Plan: Home w Home Health Services Barriers to Discharge: Continued Medical Work up               Expected Discharge Plan and Services     Post Acute Care Choice: Home Health Living arrangements for the past 2 months: Single Family Home                                       Social Drivers of Health (SDOH) Interventions SDOH Screenings   Food Insecurity: No Food Insecurity (11/20/2023)  Housing: Low Risk  (11/20/2023)  Transportation Needs: No Transportation Needs (11/20/2023)  Utilities: Not At Risk (11/20/2023)  Depression (PHQ2-9): Medium Risk (07/04/2023)  Financial Resource Strain: Low Risk  (01/13/2023)   Received from [facility]  Physical Activity: Unknown (03/18/2017)   Received from [facility]  Social Connections: Moderately Integrated (11/20/2023)  Stress: Unknown (03/18/2017)   Received from [facility]  Tobacco Use: Low Risk  (11/20/2023)    Readmission Risk Interventions     No data to display

## 2023-11-27 NOTE — Progress Notes (Signed)
 PROGRESS NOTE    Jamie Phillips  FMW:[id number] DOB: [birth year] DOA: 11/20/2023 PCP: Lenon Layman ORN, MD  Chief Complaint  Patient presents with   Rectal Bleeding    Hospital Course:  ZYION LEIDNER is an 83yo female, with history of bilateral pulmonary emboli, right lower extremity DVT in 2017 who has been chronically on Eliquis  since that time.  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  Patient noted abdominal pain with passing blood clots.  In the ED she was found to have stable vital signs and CT bleed scan noted diverticulitis and active GI bleed.  Hemoglobin at 1 point drop from baseline.  On 7/31 patient underwent embolectomy via IR.  Stay has been prolonged due to ongoing bloody bowel movements.  Subjective: Patient going for IVC filter today.  She reports no acute events overnight.  No acute issues this morning.  She is anxious to discharge home   Objective: Vitals:   11/27/23 1000 11/27/23 1035 11/27/23 1202 11/27/23 1540  BP: 129/62 123/74 (!) 112/55 124/63  Pulse: 70 65 71 71  Resp: (!) 21  20 20   Temp:  97.6 F (36.4 C) 98 F (36.7 C) (!) 97.4 F (36.3 C)  TempSrc:  Oral    SpO2: 92% 93% 95% 94%  Weight:      Height:        Intake/Output Summary (Last 24 hours) at 11/27/2023 1609 Last data filed at 11/27/2023 1300 Gross per 24 hour  Intake 339.99 ml  Output 310 ml  Net 29.99 ml   Filed Weights   11/20/23 0832 11/24/23 0400  Weight: 109.9 kg 106.1 kg    Examination: General exam: Appears calm and comfortable, NAD  Respiratory system: No work of breathing, symmetric chest wall expansion Cardiovascular system: S1 & S2 heard, RRR.  Gastrointestinal system: Abdomen is nondistended, soft and nontender.  Neuro: Alert and oriented. No focal neurological deficits. Extremities: Symmetric, expected  ROM Skin: No rashes, lesions Psychiatry: Demonstrates appropriate judgement and insight. Mood & affect appropriate for situation.   Assessment & Plan:  Principal Problem:   Gastrointestinal hemorrhage    Lower GI bleed - Diverticular bleed active on CT scan.  Status post angiogram and embolization of small active bleed in sigmoid colon - Patient was on Eliquis  prior to arrival, last dose 7/30. - Status post 1 unit PRBC - Continue monitoring hemoglobin.  It has downtrended but remains stable.  No bloody bowel movements today - Continue to hold anticoagulation.  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 - After further discussion with the patient she reports frequent falls and significant cost burden from Eliquis .  She is anxious to get off this medication if able.  Lower extremity Dopplers on 8/6 revealed no clot burden - Vascular surgery consulted for IVC. - Patient had successful IVC placement 8/7. - We have discussed the risk and benefit of continued anticoagulation and patient understands that the IVC will not prevent blood clots and is not 100% successful at preventing pulmonary emboli.  She would like to proceed without anticoagulation in the future.  CKD stage IIIa - At baseline - Trend CMP.  OSA - CPAP at night  Hyperlipidemia - Statin  GERD - Continue PPI - History of esophageal stricture.  Patient is scheduled for EGD and dilation 8/15 with GI  Prediabetes - No indication for insulin  at this time  DJD of lumbar spine - Ambulates with walker - PT/OT, home health ordered for discharge - Frequent falls  Acute blood  loss anemia - Secondary to low GI bleed - Iron profile, folate level, B12 WNL - Continue to trend hemoglobin.  Downtrending but appears to be stabilizing now.  Hypocalcemia Hypoalbuminemia - Vitamin D  within normal range  Prolonged QTc - 7/31: 516 - Improving now.  8/4 EKG QTc 489. - Continue to avoid prolonging medications - 8/5 Lexapro  resumed at patient request  Depression - Continue home meds  BMI 40 - Obesity class III - Outpatient follow up for lifestyle modification and risk factor management  DVT prophylaxis: none given bleed   Code Status: Full Code Disposition: Likely discharge home in a.m. if hemoglobin remained stable  Consultants:  Treatment Team:  Consulting Physician: Maryruth Ole DASEN, MD Consulting Physician: Marea Selinda RAMAN, MD  Procedures:    Antimicrobials:  Anti-infectives (From admission, onward)    Start     Dose/Rate Route Frequency Ordered Stop   11/27/23 0856  ceFAZolin  (ANCEF ) IVPB 2g/100 mL premix        2 g 200 mL/hr over 30 Minutes Intravenous 30 min pre-op 11/27/23 0856 11/27/23 1103   11/20/23 2000  piperacillin -tazobactam (ZOSYN ) IVPB 3.375 g        3.375 g 100 mL/hr over 30 Minutes Intravenous Every 8 hours 11/20/23 1348 11/30/23 1959   11/20/23 1200  piperacillin -tazobactam (ZOSYN ) IVPB 3.375 g        3.375 g 100 mL/hr over 30 Minutes Intravenous  Once 11/20/23 1147 11/20/23 1232       Data Reviewed: I have personally reviewed following labs and imaging studies CBC: Recent Labs  Lab 11/23/23 0647 11/23/23 1348 11/24/23 0501 11/24/23 1847 11/25/23 0503 11/25/23 1434 11/26/23 0535 11/27/23 0335  WBC 11.8*  --  11.4*  --  12.8*  --  11.4* 12.1*  NEUTROABS  --   --   --   --   --   --   --  3.3  HGB 8.1*   < > 7.9* 8.4* 7.8* 8.8* 8.0* 8.1*  HCT 26.1*   < > 25.4* 27.1* 25.5* 28.0* 25.8* 26.2*  MCV 98.5  --  97.7  --  99.6  --  98.1 97.4  PLT 185  --  190  --  212  --  222 250   < > = values in this interval not  displayed.   Basic Metabolic Panel: Recent Labs  Lab 11/22/23 0935 11/23/23 0647 11/24/23 0501 11/25/23 0503 11/26/23 0535 11/27/23 0335  NA  --  144 144 143 143 143  K  --  3.7 4.0 3.9 3.8 4.2  CL  --  109 110 107 107 107  CO2  --  28 28 31 28 26   GLUCOSE  --  105* 114* 115* 103* 110*  BUN  --  10 15 16 18 23   CREATININE  --  0.88 1.24* 1.31* 0.97 1.21*  CALCIUM   --  8.4* 8.7* 8.7* 8.6* 8.8*  MG 2.2  --  2.3  --   --  2.1  PHOS 3.4  --  3.4  --   --  4.0   GFR: Estimated Creatinine Clearance: 43.3 mL/min (A) (by C-G formula based on SCr of 1.21 mg/dL (H)). Liver Function Tests: Recent Labs  Lab 11/21/23 0458 11/22/23 0634 11/27/23 0335  AST 11* 19 14*  ALT 7 11 12   ALKPHOS 52 57 54  BILITOT 0.6 0.4 0.6  PROT 4.7* 4.8* 5.2*  ALBUMIN  3.1* 3.1* 3.0*   CBG: Recent Labs  Lab 11/22/23 2137 11/25/23 2015  GLUCAP 160* 134*    No results found for this or any previous visit (from the past 240 hours).   Radiology Studies: PERIPHERAL VASCULAR CATHETERIZATION Result Date: 11/27/2023 See surgical note for result.  US  Venous Img Lower Bilateral (DVT) Result Date: 11/26/2023 CLINICAL DATA:  Bilateral lower extremity edema and pain EXAM: BILATERAL LOWER EXTREMITY VENOUS DOPPLER ULTRASOUND TECHNIQUE: Gray-scale sonography with compression, as well as color and duplex ultrasound, were performed to evaluate the deep venous system(s) from the level of the common femoral vein through the popliteal and proximal calf veins. COMPARISON:  08/15/2015 FINDINGS: VENOUS Normal compressibility of the common femoral, superficial femoral, and popliteal veins, as well as the visualized calf veins. Visualized portions of profunda femoral vein and great saphenous vein unremarkable. No filling defects to suggest DVT on grayscale or color Doppler imaging. Doppler waveforms show normal direction of venous flow, normal respiratory plasticity and response to augmentation. OTHER None. Limitations: none  IMPRESSION: 1. No evidence of deep venous thrombosis within either lower extremity. Electronically Signed   By: Ozell Daring M.D.   On: 11/26/2023 18:42    Scheduled Meds:  DULoxetine   60 mg Oral Daily   escitalopram   20 mg Oral Daily   gabapentin   400 mg Oral QID   pantoprazole   40 mg Oral Daily   saccharomyces boulardii  250 mg Oral BID   sucralfate   1 g Oral Daily   Continuous Infusions:  piperacillin -tazobactam (ZOSYN )  IV 3.375 g (11/27/23 1107)     LOS: 7 days  MDM: Patient is high risk for one or more organ failure.  They necessitate ongoing hospitalization for continued IV therapies and subsequent lab monitoring. Total time spent interpreting labs and vitals, reviewing the medical record, coordinating care amongst consultants and care team members, directly assessing and discussing care with the patient and/or family: 55 min  Mel Tadros, DO [facility]  To contact the attending physician between 7A-7P please use Epic Chat. To contact the covering physician during after hours 7P-7A, please review Amion.  11/27/2023, 4:09 PM   *This document has been created with the assistance of dictation software. Please excuse typographical errors. *

## 2023-11-27 NOTE — Plan of Care (Signed)
  Problem: Education: Goal: Understanding of CV disease, CV risk reduction, and recovery process will improve Outcome: Progressing Goal: Individualized Educational Video(s) Outcome: Progressing   Problem: Activity: Goal: Ability to return to baseline activity level will improve Outcome: Progressing   Problem: Cardiovascular: Goal: Ability to achieve and maintain adequate cardiovascular perfusion will improve Outcome: Progressing Goal: Vascular access site(s) Level 0-1 will be maintained Outcome: Progressing   Problem: Health Behavior/Discharge Planning: Goal: Ability to safely manage health-related needs after discharge will improve Outcome: Progressing   Problem: Education: Goal: Knowledge of General Education information will improve Description: Including pain rating scale, medication(s)/side effects and non-pharmacologic comfort measures Outcome: Progressing   Problem: Health Behavior/Discharge Planning: Goal: Ability to manage health-related needs will improve Outcome: Progressing   Problem: Clinical Measurements: Goal: Ability to maintain clinical measurements within normal limits will improve Outcome: Progressing Goal: Will remain free from infection Outcome: Progressing Goal: Diagnostic test results will improve Outcome: Progressing Goal: Respiratory complications will improve Outcome: Progressing Goal: Cardiovascular complication will be avoided Outcome: Progressing   Problem: Activity: Goal: Risk for activity intolerance will decrease Outcome: Progressing   Problem: Nutrition: Goal: Adequate nutrition will be maintained Outcome: Progressing   Problem: Coping: Goal: Level of anxiety will decrease Outcome: Progressing   Problem: Elimination: Goal: Will not experience complications related to bowel motility Outcome: Progressing Goal: Will not experience complications related to urinary retention Outcome: Progressing   Problem: Pain Managment: Goal:  General experience of comfort will improve and/or be controlled Outcome: Progressing   Problem: Safety: Goal: Ability to remain free from injury will improve Outcome: Progressing   Problem: Skin Integrity: Goal: Risk for impaired skin integrity will decrease Outcome: Progressing   Problem: Education: Goal: Knowledge of disease and its progression will improve Outcome: Progressing Goal: Individualized Educational Video(s) Outcome: Progressing   Problem: Fluid Volume: Goal: Compliance with measures to maintain balanced fluid volume will improve Outcome: Progressing   Problem: Health Behavior/Discharge Planning: Goal: Ability to manage health-related needs will improve Outcome: Progressing   Problem: Nutritional: Goal: Ability to make healthy dietary choices will improve Outcome: Progressing   Problem: Clinical Measurements: Goal: Complications related to the disease process, condition or treatment will be avoided or minimized Outcome: Progressing   Problem: Education: Goal: Knowledge of the prescribed therapeutic regimen will improve Outcome: Progressing   Problem: Activity: Goal: Ability to implement measures to reduce episodes of fatigue will improve Outcome: Progressing   Problem: Bowel/Gastric: Goal: Will not experience complications related to bowel motility Outcome: Progressing   Problem: Coping: Goal: Ability to identify and develop effective coping behavior will improve Outcome: Progressing   Problem: Nutritional: Goal: Maintenance of adequate nutrition will improve Outcome: Progressing

## 2023-11-27 NOTE — Progress Notes (Signed)
 PT Cancellation Note  Patient Details Name: RASHAN PATIENT MRN: [id number] DOB: 1940/12/06   Cancelled Treatment:    Reason Eval/Treat Not Completed: Other (comment). Currently out of room for procedure. Will re-attempt when medically stable.   Lashante Fryberger 11/27/2023, 9:25 AM Corean Dade, PT, DPT, GCS [phone]

## 2023-11-27 NOTE — Op Note (Signed)
 [facility]   OPERATIVE NOTE    PRE-OPERATIVE DIAGNOSIS: GI bleed, DVT on anticoagulation   POST-OPERATIVE DIAGNOSIS: same as above  PROCEDURE: 1.   Ultrasound guidance for vascular access to the right femoral vein 2.   Catheter placement into the inferior vena cava 3.   Inferior venacavogram 4.   Placement of a Option Elite IVC filter  SURGEON: Selinda Gu, MD  ASSISTANT(S): None  ANESTHESIA: local with Moderate Conscious Sedation for approximately 10 minutes using 1 mg of Versed  and 50 mcg of Fentanyl   ESTIMATED BLOOD LOSS: minimal  CONTRAST: 10 cc  FLUORO TIME: less than one minute  FINDING(S): 1.  Patent IVC  SPECIMEN(S):  none  INDICATIONS:   Jamie Phillips is a 83 y.o. female who presents with GI bleed requiring cessation of anticoagulation with her previous DVT.  Inferior vena cava filter is indicated for this reason.  Risks and benefits including filter thrombosis, migration, fracture, bleeding, and infection were all discussed.  We discussed that all IVC filters that we place can be removed if desired from the patient once the need for the filter has passed.    DESCRIPTION: After obtaining full informed written consent, the patient was brought back to the vascular suite. The skin was sterilely prepped and draped in a sterile surgical field was created. Moderate conscious sedation was administered during a face to face encounter with the patient throughout the procedure with my supervision of the RN administering medicines and monitoring the patient's vital signs, pulse oximetry, telemetry and mental status throughout from the start of the procedure until the patient was taken to the recovery room. The right femoral vein was accessed under direct ultrasound guidance without difficulty with a Seldinger needle and a J-wire was then placed. After skin nick and dilatation, the delivery sheath was placed into the inferior vena cava and an inferior  venacavogram was performed. This demonstrated a patent IVC with the level of the renal veins at L1-L2 interspace.  The filter was then deployed into the inferior vena cava at the level of L2 just below the renal veins. The delivery sheath was then removed. Pressure was held. Sterile dressings were placed. The patient tolerated the procedure well and was taken to the recovery room in stable condition.  COMPLICATIONS: None  CONDITION: Stable  Selinda Gu  11/27/2023, 9:28 AM   This note was created with Dragon Medical transcription system. Any errors in dictation are purely unintentional.

## 2023-11-28 DIAGNOSIS — K922 Gastrointestinal hemorrhage, unspecified: Secondary | ICD-10-CM | POA: Diagnosis not present

## 2023-11-28 LAB — COMPREHENSIVE METABOLIC PANEL WITH GFR
ALT: 11 U/L (ref 0–44)
AST: 14 U/L — ABNORMAL LOW (ref 15–41)
Albumin: 3 g/dL — ABNORMAL LOW (ref 3.5–5.0)
Alkaline Phosphatase: 63 U/L (ref 38–126)
Anion gap: 9 (ref 5–15)
BUN: 26 mg/dL — ABNORMAL HIGH (ref 8–23)
CO2: 28 mmol/L (ref 22–32)
Calcium: 8.8 mg/dL — ABNORMAL LOW (ref 8.9–10.3)
Chloride: 106 mmol/L (ref 98–111)
Creatinine, Ser: 0.9 mg/dL (ref 0.44–1.00)
GFR, Estimated: >60 mL/min (ref >60)
Glucose, Bld: 102 mg/dL — ABNORMAL HIGH (ref 70–99)
Potassium: 4.2 mmol/L (ref 3.5–5.1)
Sodium: 143 mmol/L (ref 135–145)
Total Bilirubin: 0.5 mg/dL (ref 0.0–1.2)
Total Protein: 5.6 g/dL — ABNORMAL LOW (ref 6.5–8.1)

## 2023-11-28 LAB — CBC WITH DIFFERENTIAL/PLATELET
Abs Immature Granulocytes: 0.03 K/uL (ref 0.00–0.07)
Basophils Absolute: 0.1 K/uL (ref 0.0–0.1)
Basophils Relative: 1 %
Eosinophils Absolute: 0.3 K/uL (ref 0.0–0.5)
Eosinophils Relative: 3 %
HCT: 26.9 % — ABNORMAL LOW (ref 36.0–46.0)
Hemoglobin: 8.2 g/dL — ABNORMAL LOW (ref 12.0–15.0)
Immature Granulocytes: 0 %
Lymphocytes Relative: 62 %
Lymphs Abs: 6.6 K/uL — ABNORMAL HIGH (ref 0.7–4.0)
MCH: 29.7 pg (ref 26.0–34.0)
MCHC: 30.5 g/dL (ref 30.0–36.0)
MCV: 97.5 fL (ref 80.0–100.0)
Monocytes Absolute: 0.7 K/uL (ref 0.1–1.0)
Monocytes Relative: 7 %
Neutro Abs: 2.9 K/uL (ref 1.7–7.7)
Neutrophils Relative %: 27 %
Platelets: 240 K/uL (ref 150–400)
RBC: 2.76 MIL/uL — ABNORMAL LOW (ref 3.87–5.11)
RDW: 14.9 % (ref 11.5–15.5)
WBC: 10.5 K/uL (ref 4.0–10.5)
nRBC: 0 % (ref 0.0–0.2)

## 2023-11-28 LAB — MAGNESIUM: Magnesium: 2.1 mg/dL (ref 1.7–2.4)

## 2023-11-28 LAB — PHOSPHORUS: Phosphorus: 3.6 mg/dL (ref 2.5–4.6)

## 2023-11-28 NOTE — Progress Notes (Signed)
 PT Cancellation Note  Patient Details Name: Jamie Phillips MRN: [id number] DOB: 1941-02-23   Cancelled Treatment:    Reason Eval/Treat Not Completed: Other (comment) (Stopped in to visit with patient, D Cin process. Pt is AMB ad lib in room without device. Pt attests to confidence in mobility for and post DC today. Denies any DME needs for DC.) No skilled acute PT needs at this time.   1:49 PM, 11/28/23 [name] JAYSON Linear, PT, DPT Physical Therapist - [facility]  [phone] (ASCOM)     [name] 11/28/2023, 1:49 PM

## 2023-11-28 NOTE — Discharge Instructions (Signed)
 While admitted we made some changes to your blood pressure medications. Please review your discharge medications closely to ensure you are taking the correct meds at home. Please take your blood pressure once a day and keep a log. See your primary care doctor in one week to review this log and make further medication changes.

## 2023-11-28 NOTE — TOC Transition Note (Signed)
 Transition of Care [facility]) - Discharge Note   Patient Details  Name: Jamie Phillips MRN: [id number] Date of Birth: [birth year]  Transition of Care [facility]) CM/SW Contact:  Lauraine JAYSON Carpen, LCSW Phone Number: 11/28/2023, 12:52 PM   Clinical Narrative: Patient has orders to discharge home today. [facility] is aware. No further concerns. CSW signing off.    Final next level of care: Home w Home Health Services Barriers to Discharge: Barriers Resolved   Patient Goals and CMS Choice     Choice offered to / list presented to : Patient      Discharge Placement                Patient to be transferred to facility by: Son or daughter   Patient and family notified of of transfer: 11/28/23  Discharge Plan and Services Additional resources added to the After Visit Summary for       Post Acute Care Choice: Home Health                    HH Arranged: PT, OT [facility] Agency: [facility] Date [facility] Agency Contacted: 11/28/23   Representative spoke with at [facility] Agency: [place] [facility]  Social Drivers of Health (SDOH) Interventions SDOH Screenings   Food Insecurity: No Food Insecurity (11/20/2023)  Housing: Low Risk  (11/20/2023)  Transportation Needs: No Transportation Needs (11/20/2023)  Utilities: Not At Risk (11/20/2023)  Depression (PHQ2-9): Medium Risk (07/04/2023)  Financial Resource Strain: Low Risk  (01/13/2023)   Received from [facility]  Physical Activity: Unknown (03/18/2017)   Received from [facility]  Social Connections: Moderately Integrated (11/20/2023)  Stress: Unknown (03/18/2017)   Received from [facility]  Tobacco Use: Low Risk  (11/20/2023)     Readmission Risk Interventions     No data to display

## 2023-11-28 NOTE — Plan of Care (Signed)

## 2023-11-28 NOTE — Discharge Summary (Signed)
 DISCHARGE SUMMARY    Jamie Phillips FMW:[id number] DOB: [birth year] DOA: 11/20/2023  PCP: Lenon Layman ORN, MD  Admit date: 11/20/2023 Discharge date: 11/28/2023   Recommendations for Outpatient Follow-up:  Follow up with PCP in 1-2 weeks to review chronic medication management and blood pressure Keep established follow-up with cardiology and GI as scheduled  Hospital Course: Jamie Phillips is an 83yo female, with history of bilateral pulmonary emboli, right lower extremity DVT in 2017 who has been chronically on Eliquis  since that time.  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  Patient noted abdominal pain with passing blood clots.  In the ED she was found to have stable vital signs and CT bleed scan noted diverticulitis and active GI bleed.  Hemoglobin at 1 point drop from baseline.  On 7/31 patient underwent embolectomy via IR.  Stay has been prolonged due to ongoing bloody bowel movements, and decreasing hemoglobin.  We were unable to resume anticoagulation.  After further discussion with the patient she has been hoping to discontinue Eliquis  due to high cost burden, high pill burden, and frequent falls.  We discussed the risks and benefits of anticoagulation versus IVC.  Vascular surgery was consulted and patient underwent successful IVC placement on 8/7.  Postoperative course was uncomplicated and hemoglobin remained stable for 3 days.  She is discharging home today with home health physical therapy and Occupational Therapy.   Lower GI bleed - Diverticular bleed active on CT scan.  Status post angiogram and embolization of small active bleed in sigmoid colon - Patient was on Eliquis  prior to arrival, last dose 7/30. - Status post 1 unit PRBC - Hemoglobin has down trended but been stable for the last 3 days.  No longer planning to  resume anticoagulation  Diverticulitis - Patient received 8 days of Zosyn  therapy while admitted.  No longer having diarrhea or abdominal pain.   History of bilateral massive pulmonary emboli/right lower extremity DVT 2017 - At time of PE patient underwent thrombolysis and was started on lifelong Eliquis  - Thought to be hypercoagulable in setting of CLL/SLL - Initially plan was to start heparin  low-dose with bolus and then transition to Eliquis  if without recurrent bleed however hemoglobin was unstable and this has not recurred. - After further discussion with the patient she reports frequent falls and significant cost burden from Eliquis .  She is anxious to get off this medication if able.  Lower extremity Dopplers on 8/6 revealed no clot burden - Vascular surgery consulted for IVC. - Patient had successful IVC placement 8/7. - We have discussed the risk and benefit of continued anticoagulation and patient understands that the IVC will not prevent blood clots and is not 100% successful at preventing pulmonary emboli.  She would like to proceed without anticoagulation in the future.   CKD stage IIIa - At baseline - Trend CMP.   OSA - CPAP at night   Hyperlipidemia - Statin   GERD - Continue PPI - History of esophageal stricture.  Patient is scheduled for EGD and dilation 8/15 with GI   Prediabetes - No indication for insulin  at this time   DJD of lumbar spine - Ambulates with walker - PT/OT, home health ordered for discharge - Frequent falls   Acute blood loss anemia - Secondary to low GI bleed - Iron profile, folate level, B12  WNL - Continue to trend hemoglobin.  Downtrending but appears to be stabilizing now.   Hypocalcemia Hypoalbuminemia - Vitamin D  within normal range   Prolonged QTc - 7/31: 516 - Improved now.  8/4 EKG QTc 489. 8/8 420 - Continue to avoid prolonging medications - 8/5 Lexapro  resumed at patient request   Depression - Continue home meds   BMI  40 - Obesity class III - Outpatient follow up for lifestyle modification and risk factor management  Hypertension - Of note patient is on multiple antihypertensives at home including daily diuretic, ARB-HCTZ, and propranolol .  On review of patient's chart there is no documented heart failure.  Most recent echocardiogram in 2022 documents preserved EF with no diastolic dysfunction.  Patient does have upcoming appointment with cardiology this month.  Blood pressure during this admission was low to normal most days and would be unlikely to tolerate this antihypertensive regimen.  Patient also endorses frequent falls and orthostasis certainly would increase her risk.  These medications were held at discharge.  Patient is instructed to keep a blood pressure log and discuss with her PCP or cardiologist at outpatient visit.  Discharge Instructions  Discharge Instructions     Call MD for:  difficulty breathing, headache or visual disturbances   Complete by: As directed    Call MD for:  persistant dizziness or light-headedness   Complete by: As directed    Call MD for:  persistant nausea and vomiting   Complete by: As directed    Call MD for:  severe uncontrolled pain   Complete by: As directed    Call MD for:  temperature >100.4   Complete by: As directed    Diet general   Complete by: As directed    Increase activity slowly   Complete by: As directed    No wound care   Complete by: As directed       Allergies as of 11/28/2023   No Known Allergies      Medication List     PAUSE taking these medications    losartan -hydrochlorothiazide  100-12.5 MG tablet Wait to take this until your doctor or other care provider tells you to start again. Commonly known as: HYZAAR Take 0.5 tablets by mouth daily. Reported on 08/22/2015   propranolol  20 MG tablet Wait to take this until your doctor or other care provider tells you to start again. Commonly known as: INDERAL  Take 1 tablet by mouth 2  (two) times daily.   torsemide 20 MG tablet Wait to take this until your doctor or other care provider tells you to start again. Commonly known as: DEMADEX       STOP taking these medications    apixaban  5 MG Tabs tablet Commonly known as: ELIQUIS        TAKE these medications    acetaminophen  650 MG CR tablet Commonly known as: TYLENOL  Take 650-1,300 mg by mouth every 8 (eight) hours as needed for pain. TYLENOL  ARTHRITIS   b complex vitamins capsule Take 1 capsule by mouth daily.   calcium  carbonate 1500 (600 Ca) MG Tabs tablet Commonly known as: OSCAL Take 1,500 mg by mouth 2 (two) times daily with a meal.   cholestyramine  4 g packet Commonly known as: QUESTRAN  Take 1 packet (4 g total) by mouth 3 (three) times daily with meals.   DULoxetine  60 MG capsule Commonly known as: CYMBALTA  Take 1 capsule (60 mg total) by mouth daily. With Other SSRI   escitalopram  20 MG tablet Commonly known as:  LEXAPRO  Take 1 tablet by mouth daily.   gabapentin  400 MG capsule Commonly known as: Neurontin  Take 1 capsule (400 mg total) by mouth 4 (four) times daily. Take 1 pill in Am and 2 pills at night- for nerve pain   HYDROcodone -acetaminophen  10-325 MG tablet Commonly known as: Norco Take 1 tablet by mouth every 6 (six) hours as needed.   magnesium  gluconate 500 MG tablet Commonly known as: MAGONATE Take 500 mg by mouth 2 (two) times daily.   methocarbamol  500 MG tablet Commonly known as: ROBAXIN  TAKE 1 TABLET (500 MG TOTAL) EVERY 8 (EIGHT) HOURS AS NEEDED FOR MUSCLE SPASMS.   pantoprazole  40 MG tablet Commonly known as: PROTONIX    pravastatin  40 MG tablet Commonly known as: PRAVACHOL  Take 1 tablet by mouth daily.   PRESERVISION AREDS 2 PO Take 1 tablet by mouth in the morning and at bedtime.   sucralfate  1 g tablet Commonly known as: CARAFATE  Take 1 g by mouth daily.   TURMERIC CURCUMIN PO Take 1 capsule by mouth in the morning and at bedtime.   Vitamin D -3  125 MCG (5000 UT) Tabs Take by mouth daily.               Durable Medical Equipment  (From admission, onward)           Start     Ordered   11/24/23 0935  For home use only DME Walker rolling  Once       Question Answer Comment  Walker: With 5 Inch Wheels   Patient needs a walker to treat with the following condition Physical debility      11/24/23 0934            Follow-up Information     [facility] Follow up.   Specialty: Home Health Services Why: They will follow up with you for your home health therapy needs. Contact information: [address] [place] [address] [phone]                No Known Allergies  Consultations: Treatment Team:  Maryruth Ole DASEN, MD Marea Selinda RAMAN, MD   Procedures/Studies: PERIPHERAL VASCULAR CATHETERIZATION Result Date: 11/27/2023 See surgical note for result.  US  Venous Img Lower Bilateral (DVT) Result Date: 11/26/2023 CLINICAL DATA:  Bilateral lower extremity edema and pain EXAM: BILATERAL LOWER EXTREMITY VENOUS DOPPLER ULTRASOUND TECHNIQUE: Gray-scale sonography with compression, as well as color and duplex ultrasound, were performed to evaluate the deep venous system(s) from the level of the common femoral vein through the popliteal and proximal calf veins. COMPARISON:  08/15/2015 FINDINGS: VENOUS Normal compressibility of the common femoral, superficial femoral, and popliteal veins, as well as the visualized calf veins. Visualized portions of profunda femoral vein and great saphenous vein unremarkable. No filling defects to suggest DVT on grayscale or color Doppler imaging. Doppler waveforms show normal direction of venous flow, normal respiratory plasticity and response to augmentation. OTHER None. Limitations: none IMPRESSION: 1. No evidence of deep venous thrombosis within either lower extremity. Electronically Signed   By: Ozell Daring M.D.   On: 11/26/2023 18:42   IR EMBO ART  VEN  HEMORR LYMPH EXTRAV  INC GUIDE ROADMAPPING Result Date: 11/21/2023 INDICATION: 83 year old female with active diverticular bleed in the sigmoid colon. She presents for angiography and embolization. EXAM: IR EMBO ART  VEN HEMORR LYMPH EXTRAV  INC GUIDE ROADMAPPING MEDICATIONS: None. ANESTHESIA/SEDATION: Moderate (conscious) sedation was employed during this procedure. A total of Versed  0.5 mg and  Fentanyl  50 mcg was administered intravenously by the Radiology nurse. Moderate Sedation Time: 37 minutes. The patient's level of consciousness and vital signs were monitored continuously by radiology nursing throughout the procedure under my direct supervision. CONTRAST:  80mL OMNIPAQUE  IOHEXOL  300 MG/ML  SOLN FLUOROSCOPY: Radiation Exposure Index (as provided by the fluoroscopic device): 641 mGy Kerma COMPLICATIONS: None immediate. PROCEDURE: Informed consent was obtained from the patient following explanation of the procedure, risks, benefits and alternatives. The patient understands, agrees and consents for the procedure. All questions were addressed. A time out was performed prior to the initiation of the procedure. Maximal barrier sterile technique utilized including caps, mask, sterile gowns, sterile gloves, large sterile drape, hand hygiene, and Betadine prep. The right common femoral artery was interrogated with ultrasound and found to be widely patent. An image was obtained and stored for the medical record. Local anesthesia was attained by infiltration with 1% lidocaine . A small dermatotomy was made. Under real-time sonographic guidance, the vessel was punctured with a 21 gauge micropuncture needle. Using standard technique, the initial micro needle was exchanged over a 0.018 micro wire for a transitional 4 [place] micro sheath. The micro sheath was then exchanged over a 0.035 wire for a 5 French vascular sheath. A RIM catheter was used to select the inferior mesenteric artery. An arteriogram was performed. There  is a small focus of active bleeding arising from a terminal branch of the left colic artery. A Cook can tot a microcatheter was advanced over a Fathom 16 wire into the left colic artery and additional arteriography was performed in multiple obliquities to help localize the terminal branch responsible for the bleeding. Once the bleeding branch was identified, efforts were made to advance the microcatheter into this branch. Unfortunately, due to some irregularity at the origin of this artery, the microcatheter could not be advanced into the artery directly. Therefore, the microcatheter was advanced beyond the origin of this artery and coil embolization was performed across the origin of the bleeding terminal branch. Follow-up arteriography demonstrates successful embolization with no further filling of the bleeding arterial branch and no evidence of continued active extravasation. The microcatheter and 5 French base catheter were removed. Hemostasis was attained with the assistance of a Celt arterial closure device. IMPRESSION: 1. Positive for active bleeding at the terminal branch of the left colic artery corresponding with the CTA findings. 2. Successful coil embolization with cessation of bleeding at the time of angiography. Electronically Signed   By: Wilkie Lent M.D.   On: 11/21/2023 08:58   DG Chest Port 1 View Result Date: 11/20/2023 CLINICAL DATA:  83 year old female with gastrointestinal bleeding. EXAM: PORTABLE CHEST 1 VIEW COMPARISON:  CTA Abdomen and Pelvis this morning reported separately. Chest radiographs 02/13/2021. FINDINGS: Portable AP semi upright view at 1239 hours. Lower lung volumes since 2022. Stable cardiac size and mediastinal contours. Allowing for portable technique the lungs are clear. No pneumothorax or pleural effusion. Paucity of bowel gas. Chronic cervical ACDF. No acute osseous abnormality identified. IMPRESSION: Low lung volumes, otherwise no acute cardiopulmonary  abnormality. Electronically Signed   By: VEAR Hurst M.D.   On: 11/20/2023 12:46   CT ANGIO GI BLEED Result Date: 11/20/2023 CLINICAL DATA:  rectal bleeding, RLQ pain. EXAM: CTA ABDOMEN AND PELVIS WITHOUT AND WITH CONTRAST TECHNIQUE: Multidetector CT imaging of the abdomen and pelvis was performed using the standard protocol during bolus administration of intravenous contrast. Multiplanar reconstructed images and MIPs were obtained and reviewed to evaluate the vascular anatomy. RADIATION DOSE  REDUCTION: This exam was performed according to the departmental dose-optimization program which includes automated exposure control, adjustment of the mA and/or kV according to patient size and/or use of iterative reconstruction technique. CONTRAST:  OMNIPAQUE  IOHEXOL  350 MG/ML SOLN COMPARISON:  CT scan abdomen and pelvis from 10/14/2018. FINDINGS: VASCULAR Aorta: Normal caliber aorta without aneurysm, dissection, vasculitis or significant stenosis. Celiac: Patent without evidence of aneurysm, dissection, vasculitis or significant stenosis. SMA: Patent without evidence of aneurysm, dissection, vasculitis or significant stenosis. Renals: Both renal arteries are patent without evidence of aneurysm, dissection, vasculitis, fibromuscular dysplasia or significant stenosis. IMA: Patent without evidence of aneurysm, dissection, vasculitis or significant stenosis. Inflow: Patent without evidence of aneurysm, dissection, vasculitis or significant stenosis. Proximal Outflow: Bilateral common femoral and visualized portions of the superficial and profunda femoral arteries are patent without evidence of aneurysm, dissection, vasculitis or significant stenosis. Veins: No obvious venous abnormality within the limitations of this arterial phase study. Review of the MIP images confirms the above findings. NON-VASCULAR Lower chest: There are patchy atelectatic changes in the visualized lung bases. No overt consolidation. No pleural  effusion. The heart is normal in size. No pericardial effusion. Hepatobiliary: The liver is normal in size. Non-cirrhotic configuration. No suspicious mass. No intrahepatic or extrahepatic bile duct dilation. Moderate-to-large volume gallstones noted without imaging signs of acute cholecystitis. Normal gallbladder wall thickness. No pericholecystic inflammatory changes. 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 Otherwise unremarkable pancreas. No pancreatic ductal dilatation or surrounding inflammatory changes. Spleen: Within normal limits. No focal lesion. Adrenals/Urinary Tract: Adrenal glands are unremarkable. No suspicious renal mass. There is a predominantly exophytic cyst arising from the right kidney lower pole, laterally measuring 3.4 x 4.0 cm, which has significantly increased in size since the prior study however, no interval development of aggressive features. No routine follow-up is recommended. No nephroureterolithiasis or obstructive uropathy bladder wall trabeculations and pseudo diverticula noted, suggesting sequela of chronic urinary outflow obstruction. Stomach/Bowel: Postsurgical changes from prior right hemicolectomy noted. Ileocolonic anastomosis noted in the right upper quadrant, anteriorly. No disproportionate dilation of the small or large bowel loops. Liquid unformed stool noted in the left hemicolon. There are multiple diverticula throughout the colon there is a small grouping of diverticula in the distal descending colon/proximal sigmoid colon which exhibit mild surrounding fat stranding without obvious wall thickening, favoring probable smoldering diverticulitis. In this region (as seen on series 26, images 131-134 and series 14, images 52-57), there is 3 centric hyperattenuating area seen on the arterial phase images, which increases in size/volume on the portal venous phase images, compatible with active GI bleeding, likely  related to diverticulitis. There are additional, 2 well-circumscribed hyperattenuating foci in the distal sigmoid colon (series 4, image 68), which changes position on different phases of exam and are favored to represent stool material. Vascular/Lymphatic: No ascites or pneumoperitoneum. There are multiple mildly enlarged retroperitoneal and right upper quadrant mesenteric lymph nodes which have mildly increased in size since the prior study from 2020. These are indeterminate in etiology but favored benign/reactive in the given clinical setting. Attention on follow-up examination is recommended. Reproductive: Not well evaluated on the current exam. However, note is made of a lobulated uterus. There is an approximately 3.0 x 3.4 cm lesion in the right side of the fundus of the uterus, favored to represent a leiomyoma. There also additional several calcified leiomyomas. There is a well-circumscribed oval 2.6 x 3.2 cm fluid attenuation structure in the left  adnexa, favored to represent ovarian in etiology. This is favored to represent senescent ovarian cyst considering, it is essentially unchanged since the prior study from 2020; however, remains incompletely characterized. No large right adnexal mass seen. Other: Redemonstration of moderate sized midline/left paramedian periumbilical ventral hernia containing short loop of nondilated transverse colon and fat. The soft tissues and abdominal wall are otherwise unremarkable. Musculoskeletal: No suspicious osseous lesions. There are moderate multilevel degenerative changes in the visualized spine. IMPRESSION: 1. Findings favor smoldering diverticulitis at the junction of distal descending colon/proximal sigmoid colon, as described in detail above. There is active GI bleeding in this region, favored diverticular in origin. 2. Interval enlargement of multiple retroperitoneal and right upper quadrant mesenteric lymph nodes, as discussed in detail above. Attention on  follow-up examination is recommended. 3. 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 Electronically Signed   By: Ree Molt M.D.   On: 11/20/2023 10:54      Discharge Exam: Vitals:   11/28/23 0402 11/28/23 0800  BP: (!) 150/72 (!) 153/70  Pulse: 77 70  Resp: 18 16  Temp: 98.1 F (36.7 C) 98.2 F (36.8 C)  SpO2: 99% 100%   Vitals:   11/27/23 2025 11/27/23 2340 11/28/23 0402 11/28/23 0800  BP: (!) 169/62 130/63 (!) 150/72 (!) 153/70  Pulse: 76 75 77 70  Resp: 17 19 18 16   Temp: 97.7 F (36.5 C) 97.7 F (36.5 C) 98.1 F (36.7 C) 98.2 F (36.8 C)  TempSrc:      SpO2: 96% 92% 99% 100%  Weight:      Height:        Constitutional:  Normal appearance. Non toxic-appearing.  HENT: Head Normocephalic and atraumatic.  Mucous membranes are moist.  Eyes:  Extraocular intact. Conjunctivae normal.  Cardiovascular: Rate and Rhythm: Normal rate and regular rhythm.  Pulmonary: Non labored, symmetric rise of chest wall.  Skin: warm and dry. not jaundiced.  Neurological: No focal deficit present. alert. Oriented.  Psychiatric: Mood and Affect congruent.    The results of significant diagnostics from this hospitalization (including imaging, microbiology, ancillary and laboratory) are listed below for reference.     Microbiology: No results found for this or any previous visit (from the past 240 hours).   Labs: BNP (last 3 results) Recent Labs    11/20/23 0826  BNP 124.3*   Basic Metabolic Panel: Recent Labs  Lab 11/22/23 0935 11/23/23 0647 11/24/23 0501 11/25/23 0503 11/26/23 0535 11/27/23 0335 11/28/23 0506  NA  --    < > 144 143 143 143 143  K  --    < > 4.0 3.9 3.8 4.2 4.2  CL  --    < > 110 107 107 107 106  CO2  --    < > 28 31 28 26 28    GLUCOSE  --    < > 114* 115* 103* 110* 102*  BUN  --    < > 15 16 18 23  26*  CREATININE  --    < > 1.24* 1.31* 0.97 1.21* 0.90  CALCIUM   --    < > 8.7* 8.7* 8.6* 8.8* 8.8*  MG 2.2  --  2.3  --   --  2.1 2.1  PHOS 3.4  --  3.4  --   --  4.0 3.6   < > = values in this  interval not displayed.   Liver Function Tests: Recent Labs  Lab 11/22/23 0634 11/27/23 0335 11/28/23 0506  AST 19 14* 14*  ALT 11 12 11   ALKPHOS 57 54 63  BILITOT 0.4 0.6 0.5  PROT 4.8* 5.2* 5.6*  ALBUMIN  3.1* 3.0* 3.0*   No results for input(s): LIPASE, AMYLASE in the last 168 hours. No results for input(s): AMMONIA in the last 168 hours. CBC: Recent Labs  Lab 11/24/23 0501 11/24/23 1847 11/25/23 0503 11/25/23 1434 11/26/23 0535 11/27/23 0335 11/28/23 0506  WBC 11.4*  --  12.8*  --  11.4* 12.1* 10.5  NEUTROABS  --   --   --   --   --  3.3 2.9  HGB 7.9*   < > 7.8* 8.8* 8.0* 8.1* 8.2*  HCT 25.4*   < > 25.5* 28.0* 25.8* 26.2* 26.9*  MCV 97.7  --  99.6  --  98.1 97.4 97.5  PLT 190  --  212  --  222 250 240   < > = values in this interval not displayed.   Cardiac Enzymes: No results for input(s): CKTOTAL, CKMB, CKMBINDEX, TROPONINI in the last 168 hours. BNP: Invalid input(s): POCBNP CBG: Recent Labs  Lab 11/22/23 2137 11/25/23 2015  GLUCAP 160* 134*   D-Dimer No results for input(s): DDIMER in the last 72 hours. Hgb A1c No results for input(s): HGBA1C in the last 72 hours. Lipid Profile No results for input(s): CHOL, HDL, LDLCALC, TRIG, CHOLHDL, LDLDIRECT in the last 72 hours. Thyroid  function studies No results for input(s): TSH, T4TOTAL, T3FREE, THYROIDAB in the last 72 hours.  Invalid input(s): FREET3 Anemia work up No results for input(s): VITAMINB12, FOLATE, FERRITIN, TIBC, IRON, RETICCTPCT in the last 72 hours. Urinalysis    Component Value Date/Time   COLORURINE YELLOW (A) 12/13/2018 1648   APPEARANCEUR CLOUDY (A) 12/13/2018  1648   APPEARANCEUR Hazy 04/29/2012 1921   LABSPEC 1.025 12/13/2018 1648   LABSPEC 1.016 04/29/2012 1921   PHURINE 5.0 12/13/2018 1648   GLUCOSEU NEGATIVE 12/13/2018 1648   GLUCOSEU Negative 04/29/2012 1921   HGBUR NEGATIVE 12/13/2018 1648   BILIRUBINUR NEGATIVE 12/13/2018 1648   BILIRUBINUR Negative 04/29/2012 1921   KETONESUR NEGATIVE 12/13/2018 1648   PROTEINUR 30 (A) 12/13/2018 1648   NITRITE NEGATIVE 12/13/2018 1648   LEUKOCYTESUR NEGATIVE 12/13/2018 1648   LEUKOCYTESUR Trace 04/29/2012 1921   Sepsis Labs Recent Labs  Lab 11/25/23 0503 11/26/23 0535 11/27/23 0335 11/28/23 0506  WBC 12.8* 11.4* 12.1* 10.5   Microbiology No results found for this or any previous visit (from the past 240 hours).   Time coordinating discharge: 32 min   SIGNED: Geneva Barrero, DO [facility] 11/28/2023, 11:44 AM Pager   If 7PM-7AM, please contact night-coverage

## 2023-11-29 DIAGNOSIS — E7800 Pure hypercholesterolemia, unspecified: Secondary | ICD-10-CM | POA: Diagnosis not present

## 2023-11-29 DIAGNOSIS — K219 Gastro-esophageal reflux disease without esophagitis: Secondary | ICD-10-CM | POA: Diagnosis not present

## 2023-11-29 DIAGNOSIS — K5733 Diverticulitis of large intestine without perforation or abscess with bleeding: Secondary | ICD-10-CM | POA: Diagnosis not present

## 2023-11-29 DIAGNOSIS — D62 Acute posthemorrhagic anemia: Secondary | ICD-10-CM | POA: Diagnosis not present

## 2023-11-29 DIAGNOSIS — G4733 Obstructive sleep apnea (adult) (pediatric): Secondary | ICD-10-CM | POA: Diagnosis not present

## 2023-11-29 DIAGNOSIS — G2581 Restless legs syndrome: Secondary | ICD-10-CM | POA: Diagnosis not present

## 2023-11-29 DIAGNOSIS — N183 Chronic kidney disease, stage 3 unspecified: Secondary | ICD-10-CM | POA: Diagnosis not present

## 2023-11-29 DIAGNOSIS — D631 Anemia in chronic kidney disease: Secondary | ICD-10-CM | POA: Diagnosis not present

## 2023-11-29 DIAGNOSIS — I129 Hypertensive chronic kidney disease with stage 1 through stage 4 chronic kidney disease, or unspecified chronic kidney disease: Secondary | ICD-10-CM | POA: Diagnosis not present

## 2023-12-03 ENCOUNTER — Telehealth: Payer: Self-pay

## 2023-12-03 NOTE — Telephone Encounter (Signed)
 Called for a refill of Hydrocodone . She missed her last appointment because of being in the hospital (Dx.Diverticulitis). At this time she has no pain medication on hand. If granted please send refill to [facility].   Thank you

## 2023-12-04 DIAGNOSIS — N183 Chronic kidney disease, stage 3 unspecified: Secondary | ICD-10-CM | POA: Diagnosis not present

## 2023-12-04 DIAGNOSIS — G2581 Restless legs syndrome: Secondary | ICD-10-CM | POA: Diagnosis not present

## 2023-12-04 DIAGNOSIS — E7800 Pure hypercholesterolemia, unspecified: Secondary | ICD-10-CM | POA: Diagnosis not present

## 2023-12-04 DIAGNOSIS — K219 Gastro-esophageal reflux disease without esophagitis: Secondary | ICD-10-CM | POA: Diagnosis not present

## 2023-12-04 DIAGNOSIS — D62 Acute posthemorrhagic anemia: Secondary | ICD-10-CM | POA: Diagnosis not present

## 2023-12-04 DIAGNOSIS — I129 Hypertensive chronic kidney disease with stage 1 through stage 4 chronic kidney disease, or unspecified chronic kidney disease: Secondary | ICD-10-CM | POA: Diagnosis not present

## 2023-12-04 DIAGNOSIS — D631 Anemia in chronic kidney disease: Secondary | ICD-10-CM | POA: Diagnosis not present

## 2023-12-04 DIAGNOSIS — G4733 Obstructive sleep apnea (adult) (pediatric): Secondary | ICD-10-CM | POA: Diagnosis not present

## 2023-12-04 DIAGNOSIS — K5733 Diverticulitis of large intestine without perforation or abscess with bleeding: Secondary | ICD-10-CM | POA: Diagnosis not present

## 2023-12-05 ENCOUNTER — Ambulatory Visit
Admission: RE | Admit: 2023-12-05 | Discharge: 2023-12-05 | Disposition: A | Discharge status: Home / Self Care | Attending: Gastroenterology | Admitting: Gastroenterology

## 2023-12-05 ENCOUNTER — Ambulatory Visit: Admitting: Anesthesiology

## 2023-12-05 ENCOUNTER — Encounter: Payer: Self-pay | Admitting: Gastroenterology

## 2023-12-05 ENCOUNTER — Encounter
Admission: RE | Disposition: A | Discharge status: Home / Self Care | Payer: Self-pay | Source: Home / Self Care | Attending: Gastroenterology

## 2023-12-05 DIAGNOSIS — G4733 Obstructive sleep apnea (adult) (pediatric): Secondary | ICD-10-CM | POA: Diagnosis not present

## 2023-12-05 DIAGNOSIS — K2289 Other specified disease of esophagus: Secondary | ICD-10-CM | POA: Diagnosis not present

## 2023-12-05 DIAGNOSIS — R1310 Dysphagia, unspecified: Secondary | ICD-10-CM | POA: Diagnosis not present

## 2023-12-05 DIAGNOSIS — Z79899 Other long term (current) drug therapy: Secondary | ICD-10-CM | POA: Diagnosis not present

## 2023-12-05 DIAGNOSIS — I129 Hypertensive chronic kidney disease with stage 1 through stage 4 chronic kidney disease, or unspecified chronic kidney disease: Secondary | ICD-10-CM | POA: Insufficient documentation

## 2023-12-05 DIAGNOSIS — I739 Peripheral vascular disease, unspecified: Secondary | ICD-10-CM | POA: Diagnosis not present

## 2023-12-05 DIAGNOSIS — K219 Gastro-esophageal reflux disease without esophagitis: Secondary | ICD-10-CM | POA: Insufficient documentation

## 2023-12-05 DIAGNOSIS — N189 Chronic kidney disease, unspecified: Secondary | ICD-10-CM | POA: Diagnosis not present

## 2023-12-05 DIAGNOSIS — G4730 Sleep apnea, unspecified: Secondary | ICD-10-CM | POA: Insufficient documentation

## 2023-12-05 DIAGNOSIS — E66813 Obesity, class 3: Secondary | ICD-10-CM | POA: Insufficient documentation

## 2023-12-05 DIAGNOSIS — Z6841 Body Mass Index (BMI) 40.0 and over, adult: Secondary | ICD-10-CM | POA: Insufficient documentation

## 2023-12-05 DIAGNOSIS — C9110 Chronic lymphocytic leukemia of B-cell type not having achieved remission: Secondary | ICD-10-CM | POA: Diagnosis not present

## 2023-12-05 DIAGNOSIS — K31819 Angiodysplasia of stomach and duodenum without bleeding: Secondary | ICD-10-CM | POA: Insufficient documentation

## 2023-12-05 DIAGNOSIS — K224 Dyskinesia of esophagus: Secondary | ICD-10-CM | POA: Diagnosis not present

## 2023-12-05 DIAGNOSIS — K319 Disease of stomach and duodenum, unspecified: Secondary | ICD-10-CM | POA: Diagnosis not present

## 2023-12-05 DIAGNOSIS — K317 Polyp of stomach and duodenum: Secondary | ICD-10-CM | POA: Diagnosis not present

## 2023-12-05 DIAGNOSIS — I25110 Atherosclerotic heart disease of native coronary artery with unstable angina pectoris: Secondary | ICD-10-CM | POA: Diagnosis not present

## 2023-12-05 DIAGNOSIS — R1314 Dysphagia, pharyngoesophageal phase: Secondary | ICD-10-CM | POA: Diagnosis not present

## 2023-12-05 HISTORY — DX: Unspecified condition associated with female genital organs and menstrual cycle: N94.9

## 2023-12-05 HISTORY — DX: Prediabetes: R73.03

## 2023-12-05 HISTORY — PX: ESOPHAGOGASTRODUODENOSCOPY: SHX5428

## 2023-12-05 HISTORY — DX: Personal history of other diseases of the nervous system and sense organs: Z86.69

## 2023-12-05 HISTORY — DX: Restless legs syndrome: G25.81

## 2023-12-05 HISTORY — DX: Other intervertebral disc degeneration, lumbar region without mention of lumbar back pain or lower extremity pain: M51.369

## 2023-12-05 SURGERY — EGD (ESOPHAGOGASTRODUODENOSCOPY)
Anesthesia: General

## 2023-12-05 MED ORDER — DEXMEDETOMIDINE HCL IN NACL 80 MCG/20ML IV SOLN
INTRAVENOUS | Status: AC
  Filled 2023-12-05: qty 20

## 2023-12-05 MED ORDER — LIDOCAINE HCL (PF) 2 % IJ SOLN
INTRAMUSCULAR | Status: AC
  Filled 2023-12-05: qty 5

## 2023-12-05 MED ORDER — GLYCOPYRROLATE 0.2 MG/ML IJ SOLN
INTRAMUSCULAR | Status: DC | PRN
  Administered 2023-12-05: .2 mg via INTRAVENOUS

## 2023-12-05 MED ORDER — EPHEDRINE 5 MG/ML INJ
INTRAVENOUS | Status: AC
  Filled 2023-12-05: qty 5

## 2023-12-05 MED ORDER — GLYCOPYRROLATE 0.2 MG/ML IJ SOLN
INTRAMUSCULAR | Status: AC
  Filled 2023-12-05: qty 4

## 2023-12-05 MED ORDER — SODIUM CHLORIDE 0.9 % IV SOLN
INTRAVENOUS | Status: DC
  Administered 2023-12-05: 20 mL/h via INTRAVENOUS

## 2023-12-05 MED ORDER — PROPOFOL 500 MG/50ML IV EMUL
INTRAVENOUS | Status: DC | PRN
  Administered 2023-12-05: 50 ug/kg/min via INTRAVENOUS

## 2023-12-05 MED ORDER — PHENYLEPHRINE 80 MCG/ML (10ML) SYRINGE FOR IV PUSH (FOR BLOOD PRESSURE SUPPORT)
PREFILLED_SYRINGE | INTRAVENOUS | Status: AC
Start: 2023-12-05 — End: 2023-12-05
  Filled 2023-12-05: qty 10

## 2023-12-05 MED ORDER — PROPOFOL 10 MG/ML IV BOLUS
INTRAVENOUS | Status: DC | PRN
  Administered 2023-12-05 (×2): 40 mg via INTRAVENOUS

## 2023-12-05 MED ORDER — LIDOCAINE HCL (CARDIAC) PF 100 MG/5ML IV SOSY
PREFILLED_SYRINGE | INTRAVENOUS | Status: DC | PRN
  Administered 2023-12-05: 80 mg via INTRAVENOUS

## 2023-12-05 NOTE — Anesthesia Preprocedure Evaluation (Addendum)
 Anesthesia Evaluation  Patient identified by MRN, date of birth, ID band Patient awake    Reviewed: Allergy & Precautions, NPO status , Patient's Chart, lab work & pertinent test results  Airway Mallampati: II  TM Distance: >3 FB Neck ROM: full    Dental  (+) Missing   Pulmonary sleep apnea and Continuous Positive Airway Pressure Ventilation , PE   Pulmonary exam normal  + decreased breath sounds      Cardiovascular Exercise Tolerance: Poor hypertension, Pt. on medications + angina  + CAD and + Peripheral Vascular Disease  Normal cardiovascular exam Rhythm:Regular Rate:Normal     Neuro/Psych  Headaches  Anxiety     negative neurological ROS  negative psych ROS   GI/Hepatic negative GI ROS, Neg liver ROS,GERD  Medicated,,  Endo/Other  negative endocrine ROS  Class 3 obesity  Renal/GU negative Renal ROS  negative genitourinary   Musculoskeletal  (+) Arthritis ,    Abdominal  (+) + obese  Peds negative pediatric ROS (+)  Hematology negative hematology ROS (+)   Anesthesia Other Findings Past Medical History: No date: Adnexal cyst No date: Anginal pain (HCC)     Comment:  atypical chest pain No date: Anxiety No date: Arthritis No date: Back pain No date: Cancer (HCC)     Comment:  cll No date: Chronic kidney disease No date: CLL (chronic lymphocytic leukemia) (HCC) No date: DDD (degenerative disc disease), lumbar No date: Depression No date: Epigastric pain No date: GERD (gastroesophageal reflux disease) No date: Headache No date: Hemorrhoids No date: History of Bell's palsy No date: Hypercholesteremia No date: Hyperlipidemia No date: Hypertension 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) No date: Macular degeneration 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) No date: Morbid obesity (HCC) No date: Obesity No date: Pre-diabetes No date: Pulmonary embolism  (HCC) No date: PVD (peripheral vascular disease) (HCC) No date: RLS (restless legs syndrome) No date: Sleep apnea     Comment:  CPAP  Past Surgical History: No date: APPENDECTOMY 08/19/2004: BREAST BIOPSY; Left     Comment:  lt bx/clip-neg 03/05/2023: BREAST BIOPSY; Left     Comment:  US  BX heart clip, path pending- 03/05/2023: BREAST BIOPSY; Left     Comment:  US  BX hydromark coil clip axilla node- path pending 03/05/2023: BREAST BIOPSY; Left     Comment:  US  LT BREAST BX W LOC DEV 1ST LESION IMG BX SPEC US                GUIDE 03/05/2023 [facility] 09/10/2022: CATARACT EXTRACTION W/PHACO; Left     Comment:  Procedure: CATARACT EXTRACTION PHACO AND INTRAOCULAR               LENS PLACEMENT (IOC) LEFT  5.88  00:31.9;  Surgeon:               Jaye Fallow, MD;  Location: [facility];                Service: Ophthalmology;  Laterality: Left;  sleep apnea No date: COLONOSCOPY 03/10/2017: COLONOSCOPY WITH PROPOFOL ; N/A     Comment:  Procedure: COLONOSCOPY WITH PROPOFOL ;  Surgeon:               Gaylyn Gladis PENNER, MD;  Location: [facility];                Service: Endoscopy;  Laterality: N/A; No date: DILATION AND CURETTAGE OF UTERUS 02/02/2019: ESOPHAGOGASTRODUODENOSCOPY (EGD) WITH PROPOFOL ; N/A  Comment:  Procedure: ESOPHAGOGASTRODUODENOSCOPY (EGD) WITH               PROPOFOL ;  Surgeon: Toledo, Ladell POUR, MD;  Location:               [facility];  Service: Endoscopy;  Laterality: N/A; No date: EYE SURGERY 11/20/2023: IR EMBO ART  VEN HEMORR LYMPH EXTRAV  INC GUIDE ROADMAPPING 06/11/2016: IVC FILTER INSERTION; N/A     Comment:  Procedure: IVC Filter Insertion;  Surgeon: Cordella KANDICE Shawl, MD;  Location: [facility];  Service:               Cardiovascular;  Laterality: N/A; 11/27/2023: IVC FILTER INSERTION; N/A     Comment:  Procedure: IVC FILTER INSERTION;  Surgeon: Marea Selinda RAMAN,              MD;  Location: [facility];   Service:               Cardiovascular;  Laterality: N/A; 05/06/2017: IVC FILTER REMOVAL; N/A     Comment:  Procedure: IVC FILTER REMOVAL;  Surgeon: Shawl Cordella KANDICE, MD;  Location: [facility];  Service:              Cardiovascular;  Laterality: N/A; 06/17/2016: JOINT REPLACEMENT; Left     Comment:  Knees, pt stated left and right have been replaced. 10/10/2018: LAPAROSCOPIC APPENDECTOMY; N/A     Comment:  Procedure: APPENDECTOMY LAPAROSCOPIC ATTEMPTED CONVERTED              TO OPEN;  Surgeon: Rodolph Romano, MD;  Location:               [facility];  Service: General;  Laterality: N/A; 10/10/2018: LAPAROSCOPY; N/A     Comment:  Procedure: LAPAROSCOPY DIAGNOSTIC ATTEMPTED CONVERTED TO              OPEN;  Surgeon: Rodolph Romano, MD;  Location:               [facility];  Service: General;  Laterality: N/A; 07/09/2021: LUMBAR LAMINECTOMY/DECOMPRESSION MICRODISCECTOMY; N/A     Comment:  Procedure: Laminectomy and Foraminotomy - Lumbar               Two-Lumbar Three - Lumbar Three-Lumbar Four - Lumbar               Four-Lumbar Five;  Surgeon: Louis Shove, MD;  [facility];  Service: Neurosurgery;  Laterality: N/A; No date: neck fusion No date: OPEN REDUCTION INTERNAL FIXATION (ORIF) WRIST WITH ILIAC  CREST BONE GRAFT; Left 10/10/2018: PARTIAL COLECTOMY; Right     Comment:  Procedure: PARTIAL COLECTOMY;  Surgeon: Rodolph Romano, MD;  Location: [facility];  Service: General;                Laterality: Right; 08/15/2015: PERIPHERAL VASCULAR CATHETERIZATION; N/A     Comment:  Procedure: pulmonary angiogram with lysis;  Surgeon:               Cordella KANDICE Shawl, MD;  Location: [facility];  Service: Cardiovascular;  Laterality: N/A; 2000: WRIST SURGERY; Left  BMI    Body Mass Index: 40.85 kg/m      Reproductive/Obstetrics negative OB ROS                               Anesthesia Physical Anesthesia Plan  ASA: 3  Anesthesia Plan: General   Post-op Pain Management:    Induction: Intravenous  PONV Risk Score and Plan: Propofol  infusion and TIVA  Airway Management Planned: Natural Airway and Nasal Cannula  Additional Equipment:   Intra-op Plan:   Post-operative Plan:   Informed Consent: I have reviewed the patients History and Physical, chart, labs and discussed the procedure including the risks, benefits and alternatives for the proposed anesthesia with the patient or authorized representative who has indicated his/her understanding and acceptance.     Dental Advisory Given  Plan Discussed with: CRNA  Anesthesia Plan Comments:          Anesthesia Quick Evaluation

## 2023-12-05 NOTE — H&P (Signed)
 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 History of some sort of neck surgery. Not on blood thinners currently due to recent history of diverticulitis. Has more trouble swallowing liquids than solids.    Current Facility-Administered Medications:    0.9 %  sodium chloride  infusion, , Intravenous, Continuous, Teruko Joswick, Jamie ONEIDA, MD, Last Rate: 20 mL/hr at 12/05/23 1015, Continued from Pre-op at 12/05/23 1015  Medications Prior to Admission  Medication Sig Dispense Refill Last Dose/Taking   acetaminophen  (TYLENOL ) 650 MG CR tablet Take 650-1,300 mg by mouth every 8 (eight) hours as needed for pain. TYLENOL  ARTHRITIS   Past Week   b complex vitamins capsule Take 1 capsule by mouth daily.   Past Week   calcium  carbonate (OSCAL) 1500 (600 Ca) MG TABS tablet Take 1,500 mg by mouth 2 (two) times daily with a meal.   Past Week   Cholecalciferol (VITAMIN D -3) 125 MCG (5000 UT) TABS Take by mouth daily.   Past Week   cholestyramine  (QUESTRAN ) 4 g packet Take 1 packet (4 g total) by mouth 3 (three) times daily with meals. 60 each 0 Past Week   DULoxetine  (CYMBALTA ) 60 MG capsule Take 1 capsule (60 mg total) by mouth daily. With Other SSRI 90 capsule 3 12/05/2023 at  6:30 AM   escitalopram  (LEXAPRO ) 20 MG tablet Take 1 tablet by mouth daily.   12/05/2023 at  6:30 AM   gabapentin  (NEURONTIN ) 400 MG capsule Take 1 capsule (400 mg total) by mouth 4 (four) times daily. Take 1 pill in Am and 2 pills at night- for nerve pain 360 capsule 1 12/05/2023 at  6:30 AM   HYDROcodone -acetaminophen  (NORCO) 10-325 MG tablet Take 1 tablet by mouth every 6 (six) hours as needed. 120 tablet 0 Past Week   methocarbamol  (ROBAXIN ) 500 MG tablet TAKE 1 TABLET (500 MG TOTAL) EVERY 8 (EIGHT) HOURS AS NEEDED FOR MUSCLE  SPASMS. 180 tablet 5 Past Week   Multiple Vitamins-Minerals (PRESERVISION AREDS 2 PO) Take 1 tablet by mouth in the morning and at bedtime.   Past Week   pantoprazole  (PROTONIX ) 40 MG tablet    12/05/2023 at  6:30 AM   pravastatin  (PRAVACHOL ) 40 MG tablet Take 1 tablet by mouth daily.   12/05/2023 at  6:30 AM   sucralfate  (CARAFATE ) 1 g tablet Take 1 g by mouth daily.   12/05/2023 at  6:30 AM   TURMERIC CURCUMIN PO Take 1 capsule by mouth in the morning and at bedtime.   Past Week   [Paused] losartan -hydrochlorothiazide  (HYZAAR) 100-12.5 MG tablet Take 0.5 tablets by mouth daily. Reported on 08/22/2015 30 tablet     magnesium  gluconate (MAGONATE) 500 MG tablet Take 500 mg by mouth 2 (two) times daily.      [Paused] propranolol  (INDERAL ) 20 MG tablet Take 1 tablet by mouth 2 (two) times daily.      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    Physical Exam  Gen: Alert, oriented. Appears stated age.  HEENT: PERRLA. Lungs: No respiratory distress CV: RRR Abd: soft, benign, no masses Ext: No edema    Planned procedures: Proceed with EGD.  The patient understands the nature of the planned procedure, indications, risks, alternatives and potential complications including but not limited to bleeding, infection, perforation, damage to internal organs and possible oversedation/side effects from anesthesia. The patient agrees and gives consent to proceed.  Please refer to procedure notes for findings, recommendations and patient disposition/instructions.     Jamie ONEIDA Schick, MD [facility]

## 2023-12-05 NOTE — Op Note (Signed)
 [facility] Patient Name: Jamie Phillips Procedure Date: 12/05/2023 10:15 AM MRN: [id number] Account #: [id number] Date of Birth: [birth year] Admit Type: Outpatient Age: 83 Room: [facility] ENDO ROOM 3 Gender: Female Note Status: Finalized Instrument Name: Barnie GI Scope [id number] Procedure:             Upper GI endoscopy Indications:           Dysphagia Providers:             Ole Schick MD, MD Referring MD:          Layman ORN. Lenon MD, MD (Referring MD) Medicines:             Monitored Anesthesia Care Complications:         No immediate complications. Estimated blood loss:                         Minimal. Procedure:             Pre-Anesthesia Assessment:                        - Prior to the procedure, a History and Physical was                         performed, and patient medications and allergies were                         reviewed. The patient is competent. The risks and                         benefits of the procedure and the sedation options and                         risks were discussed with the patient. All questions                         were answered and informed consent was obtained.                         Patient identification and proposed procedure were                         verified by the physician, the nurse, the                         anesthesiologist, the anesthetist and the technician                         in the endoscopy suite. Mental Status Examination:                         alert and oriented. Airway Examination: normal                         oropharyngeal airway and neck mobility. Respiratory                         Examination: clear to auscultation. CV Examination:  normal. Prophylactic Antibiotics: The patient does not                         require prophylactic antibiotics. Prior                         Anticoagulants: The patient has taken no anticoagulant                          or antiplatelet agents. ASA Grade Assessment: III - A                         patient with severe systemic disease. After reviewing                         the risks and benefits, the patient was deemed in                         satisfactory condition to undergo the procedure. The                         anesthesia plan was to use monitored anesthesia care                         (MAC). Immediately prior to administration of                         medications, the patient was re-assessed for adequacy                         to receive sedatives. The heart rate, respiratory                         rate, oxygen saturations, blood pressure, adequacy of                         pulmonary ventilation, and response to care were                         monitored throughout the procedure. The physical                         status of the patient was re-assessed after the                         procedure.                        After obtaining informed consent, the endoscope was                         passed under direct vision. Throughout the procedure,                         the patient's blood pressure, pulse, and oxygen                         saturations were monitored continuously. The Endoscope  was introduced through the mouth, and advanced to the                         second part of duodenum. The upper GI endoscopy was                         accomplished without difficulty. The patient tolerated                         the procedure well. 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      The exam of the esophagus was otherwise normal.      Mild gastric antral vascular ectasia without bleeding was present in the       gastric antrum. Biopsies were taken with a cold forceps for histology.       Estimated blood loss was minimal.      Multiple small sessile fundic gland polyps with no stigmata of recent       bleeding were found in the  gastric body.      The examined duodenum was normal. Impression:            - Hypertonic lower esophageal sphincter.                        - Gastric antral vascular ectasia without bleeding.                         Biopsied.                        - Multiple fundic gland polyps.                        - Normal examined duodenum. Recommendation:        - Discharge patient to home.                        - Resume previous diet.                        - Continue present medications.                        - Await pathology results.                        - Return to referring physician as previously                         scheduled. Procedure Code(s):     --- Professional ---                        [id number], Esophagogastroduodenoscopy, flexible,                         transoral; with biopsy, single or multiple Diagnosis Code(s):     --- Professional ---                        K22.89, Other specified disease of esophagus  K31.819, Angiodysplasia of stomach and duodenum                         without bleeding                        K31.7, Polyp of stomach and duodenum                        R13.10, Dysphagia, unspecified CPT copyright 2022 American Medical Association. All rights reserved. The codes documented in this report are preliminary and upon coder review may  be revised to meet current compliance requirements. Ole Schick MD, MD 12/05/2023 10:49:15 AM Number of Addenda: 0 Note Initiated On: 12/05/2023 10:15 AM Estimated Blood Loss:  Estimated blood loss was minimal.      [facility]

## 2023-12-05 NOTE — Anesthesia Postprocedure Evaluation (Signed)
 Anesthesia Post Note  Patient: Jamie Phillips  Procedure(s) Performed: EGD (ESOPHAGOGASTRODUODENOSCOPY)  Patient location during evaluation: PACU Anesthesia Type: General Level of consciousness: awake Pain management: satisfactory to patient Vital Signs Assessment: post-procedure vital signs reviewed and stable Respiratory status: nonlabored ventilation and spontaneous breathing Cardiovascular status: stable Anesthetic complications: no   No notable events documented.   Last Vitals:  Vitals:   12/05/23 1057 12/05/23 1106  BP: 132/71 137/78  Pulse: 81 78  Resp: 18 16  Temp:  (!) 36.1 C  SpO2: 92% 94%    Last Pain:  Vitals:   12/05/23 1106  TempSrc:   PainSc: 0-No pain                 VAN STAVEREN,Asya Derryberry

## 2023-12-05 NOTE — Transfer of Care (Signed)
 Immediate Anesthesia Transfer of Care Note  Patient: Jamie Phillips  Procedure(s) Performed: EGD (ESOPHAGOGASTRODUODENOSCOPY)  Patient Location: PACU  Anesthesia Type:General  Level of Consciousness: sedated  Airway & Oxygen Therapy: Patient Spontanous Breathing  Post-op Assessment: Report given to RN and Post -op Vital signs reviewed and stable  Post vital signs: Reviewed and stable  Last Vitals:  Vitals Value Taken Time  BP 98/68 12/05/23 10:43  Temp    Pulse    Resp 17 12/05/23 10:43  SpO2      Last Pain:  Vitals:   12/05/23 0952  TempSrc:   PainSc: 0-No pain         Complications: No notable events documented.

## 2023-12-05 NOTE — Interval H&P Note (Signed)
 History and Physical Interval Note:  12/05/2023 10:23 AM  Jamie Phillips  has presented today for surgery, with the diagnosis of JACKHAMMER ESOPHAGUIS,PHARYNGOESOPHAGEAL DYSPHAGIA.  The various methods of treatment have been discussed with the patient and family. After consideration of risks, benefits and other options for treatment, the patient has consented to  Procedure(s): EGD (ESOPHAGOGASTRODUODENOSCOPY) (N/A) as a surgical intervention.  The patient's history has been reviewed, patient examined, no change in status, stable for surgery.  I have reviewed the patient's chart and labs.  Questions were answered to the patient's satisfaction.     Jamie Phillips to proceed with EGD

## 2023-12-08 DIAGNOSIS — N183 Chronic kidney disease, stage 3 unspecified: Secondary | ICD-10-CM | POA: Diagnosis not present

## 2023-12-08 DIAGNOSIS — D631 Anemia in chronic kidney disease: Secondary | ICD-10-CM | POA: Diagnosis not present

## 2023-12-08 DIAGNOSIS — D62 Acute posthemorrhagic anemia: Secondary | ICD-10-CM | POA: Diagnosis not present

## 2023-12-08 DIAGNOSIS — K219 Gastro-esophageal reflux disease without esophagitis: Secondary | ICD-10-CM | POA: Diagnosis not present

## 2023-12-08 DIAGNOSIS — I129 Hypertensive chronic kidney disease with stage 1 through stage 4 chronic kidney disease, or unspecified chronic kidney disease: Secondary | ICD-10-CM | POA: Diagnosis not present

## 2023-12-08 DIAGNOSIS — G2581 Restless legs syndrome: Secondary | ICD-10-CM | POA: Diagnosis not present

## 2023-12-08 DIAGNOSIS — E7800 Pure hypercholesterolemia, unspecified: Secondary | ICD-10-CM | POA: Diagnosis not present

## 2023-12-08 DIAGNOSIS — K5733 Diverticulitis of large intestine without perforation or abscess with bleeding: Secondary | ICD-10-CM | POA: Diagnosis not present

## 2023-12-08 DIAGNOSIS — G4733 Obstructive sleep apnea (adult) (pediatric): Secondary | ICD-10-CM | POA: Diagnosis not present

## 2023-12-08 LAB — SURGICAL PATHOLOGY

## 2023-12-09 DIAGNOSIS — G4733 Obstructive sleep apnea (adult) (pediatric): Secondary | ICD-10-CM | POA: Diagnosis not present

## 2023-12-09 DIAGNOSIS — I2782 Chronic pulmonary embolism: Secondary | ICD-10-CM | POA: Diagnosis not present

## 2023-12-09 DIAGNOSIS — C9110 Chronic lymphocytic leukemia of B-cell type not having achieved remission: Secondary | ICD-10-CM | POA: Diagnosis not present

## 2023-12-09 DIAGNOSIS — F325 Major depressive disorder, single episode, in full remission: Secondary | ICD-10-CM | POA: Diagnosis not present

## 2023-12-09 DIAGNOSIS — I129 Hypertensive chronic kidney disease with stage 1 through stage 4 chronic kidney disease, or unspecified chronic kidney disease: Secondary | ICD-10-CM | POA: Diagnosis not present

## 2023-12-09 DIAGNOSIS — I2692 Saddle embolus of pulmonary artery without acute cor pulmonale: Secondary | ICD-10-CM | POA: Diagnosis not present

## 2023-12-09 DIAGNOSIS — R7303 Prediabetes: Secondary | ICD-10-CM | POA: Diagnosis not present

## 2023-12-09 DIAGNOSIS — N1831 Chronic kidney disease, stage 3a: Secondary | ICD-10-CM | POA: Diagnosis not present

## 2023-12-11 DIAGNOSIS — R7303 Prediabetes: Secondary | ICD-10-CM | POA: Diagnosis not present

## 2023-12-11 DIAGNOSIS — Z6841 Body Mass Index (BMI) 40.0 and over, adult: Secondary | ICD-10-CM | POA: Diagnosis not present

## 2023-12-11 DIAGNOSIS — N183 Chronic kidney disease, stage 3 unspecified: Secondary | ICD-10-CM | POA: Diagnosis not present

## 2023-12-11 DIAGNOSIS — C9110 Chronic lymphocytic leukemia of B-cell type not having achieved remission: Secondary | ICD-10-CM | POA: Diagnosis not present

## 2023-12-11 DIAGNOSIS — G4733 Obstructive sleep apnea (adult) (pediatric): Secondary | ICD-10-CM | POA: Diagnosis not present

## 2023-12-11 DIAGNOSIS — Z01818 Encounter for other preprocedural examination: Secondary | ICD-10-CM | POA: Diagnosis not present

## 2023-12-11 DIAGNOSIS — E7800 Pure hypercholesterolemia, unspecified: Secondary | ICD-10-CM | POA: Diagnosis not present

## 2023-12-11 DIAGNOSIS — I129 Hypertensive chronic kidney disease with stage 1 through stage 4 chronic kidney disease, or unspecified chronic kidney disease: Secondary | ICD-10-CM | POA: Diagnosis not present

## 2023-12-11 DIAGNOSIS — D631 Anemia in chronic kidney disease: Secondary | ICD-10-CM | POA: Diagnosis not present

## 2023-12-11 DIAGNOSIS — K5733 Diverticulitis of large intestine without perforation or abscess with bleeding: Secondary | ICD-10-CM | POA: Diagnosis not present

## 2023-12-11 DIAGNOSIS — F325 Major depressive disorder, single episode, in full remission: Secondary | ICD-10-CM | POA: Diagnosis not present

## 2023-12-11 DIAGNOSIS — K219 Gastro-esophageal reflux disease without esophagitis: Secondary | ICD-10-CM | POA: Diagnosis not present

## 2023-12-11 DIAGNOSIS — G2581 Restless legs syndrome: Secondary | ICD-10-CM | POA: Diagnosis not present

## 2023-12-11 DIAGNOSIS — D62 Acute posthemorrhagic anemia: Secondary | ICD-10-CM | POA: Diagnosis not present

## 2023-12-12 ENCOUNTER — Other Ambulatory Visit: Payer: Self-pay | Admitting: Neurosurgery

## 2023-12-12 DIAGNOSIS — G2581 Restless legs syndrome: Secondary | ICD-10-CM | POA: Diagnosis not present

## 2023-12-12 DIAGNOSIS — K5733 Diverticulitis of large intestine without perforation or abscess with bleeding: Secondary | ICD-10-CM | POA: Diagnosis not present

## 2023-12-12 DIAGNOSIS — E7800 Pure hypercholesterolemia, unspecified: Secondary | ICD-10-CM | POA: Diagnosis not present

## 2023-12-12 DIAGNOSIS — K219 Gastro-esophageal reflux disease without esophagitis: Secondary | ICD-10-CM | POA: Diagnosis not present

## 2023-12-12 DIAGNOSIS — N183 Chronic kidney disease, stage 3 unspecified: Secondary | ICD-10-CM | POA: Diagnosis not present

## 2023-12-12 DIAGNOSIS — D631 Anemia in chronic kidney disease: Secondary | ICD-10-CM | POA: Diagnosis not present

## 2023-12-12 DIAGNOSIS — G4733 Obstructive sleep apnea (adult) (pediatric): Secondary | ICD-10-CM | POA: Diagnosis not present

## 2023-12-12 DIAGNOSIS — D62 Acute posthemorrhagic anemia: Secondary | ICD-10-CM | POA: Diagnosis not present

## 2023-12-12 DIAGNOSIS — I129 Hypertensive chronic kidney disease with stage 1 through stage 4 chronic kidney disease, or unspecified chronic kidney disease: Secondary | ICD-10-CM | POA: Diagnosis not present

## 2023-12-12 MED ORDER — HYDROCODONE-ACETAMINOPHEN 10-325 MG PO TABS: 1.0000 | ORAL_TABLET | Freq: Four times a day (QID) | ORAL | 0 refills | Status: DC | PRN

## 2023-12-12 NOTE — Telephone Encounter (Signed)
 Patient advised.

## 2023-12-12 NOTE — Telephone Encounter (Addendum)
 Patient last seen 09/03/2023, next appointment 12/18/2023. Asking for pain medication to be refilled.   Thank you Filled  Written  ID  Drug  QTY  Days  Prescriber  RX #  Dispenser  Refill  Daily Dose*  Pymt Type  PMP  10/28/2023 04/25/2023 1  Gabapentin  400 Mg Capsule 270.00 90 Ma And [id number] [facility] (5313) 2/3  Medicare [place] 09/03/2023 09/03/2023 1  Hydrocodone -Acetamin 10-325 Mg 120.00 30 Eu Tho [id number] [facility] ([id number]) 0/0 40.00 MME Medicare [place]  Please advise.

## 2023-12-15 DIAGNOSIS — K219 Gastro-esophageal reflux disease without esophagitis: Secondary | ICD-10-CM | POA: Diagnosis not present

## 2023-12-15 DIAGNOSIS — D62 Acute posthemorrhagic anemia: Secondary | ICD-10-CM | POA: Diagnosis not present

## 2023-12-15 DIAGNOSIS — G2581 Restless legs syndrome: Secondary | ICD-10-CM | POA: Diagnosis not present

## 2023-12-15 DIAGNOSIS — E7800 Pure hypercholesterolemia, unspecified: Secondary | ICD-10-CM | POA: Diagnosis not present

## 2023-12-15 DIAGNOSIS — G4733 Obstructive sleep apnea (adult) (pediatric): Secondary | ICD-10-CM | POA: Diagnosis not present

## 2023-12-15 DIAGNOSIS — N183 Chronic kidney disease, stage 3 unspecified: Secondary | ICD-10-CM | POA: Diagnosis not present

## 2023-12-15 DIAGNOSIS — I129 Hypertensive chronic kidney disease with stage 1 through stage 4 chronic kidney disease, or unspecified chronic kidney disease: Secondary | ICD-10-CM | POA: Diagnosis not present

## 2023-12-15 DIAGNOSIS — D631 Anemia in chronic kidney disease: Secondary | ICD-10-CM | POA: Diagnosis not present

## 2023-12-15 DIAGNOSIS — K5733 Diverticulitis of large intestine without perforation or abscess with bleeding: Secondary | ICD-10-CM | POA: Diagnosis not present

## 2023-12-16 ENCOUNTER — Telehealth: Payer: Self-pay

## 2023-12-16 DIAGNOSIS — I129 Hypertensive chronic kidney disease with stage 1 through stage 4 chronic kidney disease, or unspecified chronic kidney disease: Secondary | ICD-10-CM | POA: Diagnosis not present

## 2023-12-16 DIAGNOSIS — M19011 Primary osteoarthritis, right shoulder: Secondary | ICD-10-CM | POA: Diagnosis not present

## 2023-12-16 DIAGNOSIS — N1831 Chronic kidney disease, stage 3a: Secondary | ICD-10-CM | POA: Diagnosis not present

## 2023-12-16 NOTE — Telephone Encounter (Signed)
 SABRA

## 2023-12-16 NOTE — Telephone Encounter (Addendum)
 Please call Dr Lamount office and tell them they sent this fax to the wrong office. She is scheduled for surgery with Dr Louis, not Dr Clois. Thank you!

## 2023-12-17 NOTE — Telephone Encounter (Signed)
 Left message for Jamie Phillips at [facility].

## 2023-12-18 DIAGNOSIS — K219 Gastro-esophageal reflux disease without esophagitis: Secondary | ICD-10-CM | POA: Diagnosis not present

## 2023-12-18 DIAGNOSIS — K5733 Diverticulitis of large intestine without perforation or abscess with bleeding: Secondary | ICD-10-CM | POA: Diagnosis not present

## 2023-12-18 DIAGNOSIS — I129 Hypertensive chronic kidney disease with stage 1 through stage 4 chronic kidney disease, or unspecified chronic kidney disease: Secondary | ICD-10-CM | POA: Diagnosis not present

## 2023-12-18 DIAGNOSIS — D62 Acute posthemorrhagic anemia: Secondary | ICD-10-CM | POA: Diagnosis not present

## 2023-12-18 DIAGNOSIS — D631 Anemia in chronic kidney disease: Secondary | ICD-10-CM | POA: Diagnosis not present

## 2023-12-18 DIAGNOSIS — G2581 Restless legs syndrome: Secondary | ICD-10-CM | POA: Diagnosis not present

## 2023-12-18 DIAGNOSIS — E7800 Pure hypercholesterolemia, unspecified: Secondary | ICD-10-CM | POA: Diagnosis not present

## 2023-12-18 DIAGNOSIS — G4733 Obstructive sleep apnea (adult) (pediatric): Secondary | ICD-10-CM | POA: Diagnosis not present

## 2023-12-18 DIAGNOSIS — N183 Chronic kidney disease, stage 3 unspecified: Secondary | ICD-10-CM | POA: Diagnosis not present

## 2023-12-19 ENCOUNTER — Encounter: Attending: Registered Nurse | Admitting: Registered Nurse

## 2023-12-19 VITALS — BP 123/83 | HR 73 | Ht 64.0 in | Wt 242.0 lb

## 2023-12-19 DIAGNOSIS — Z6841 Body Mass Index (BMI) 40.0 and over, adult: Secondary | ICD-10-CM | POA: Insufficient documentation

## 2023-12-19 DIAGNOSIS — G5793 Unspecified mononeuropathy of bilateral lower limbs: Secondary | ICD-10-CM | POA: Diagnosis not present

## 2023-12-19 DIAGNOSIS — G894 Chronic pain syndrome: Secondary | ICD-10-CM | POA: Insufficient documentation

## 2023-12-19 DIAGNOSIS — G8929 Other chronic pain: Secondary | ICD-10-CM | POA: Diagnosis not present

## 2023-12-19 DIAGNOSIS — Z79891 Long term (current) use of opiate analgesic: Secondary | ICD-10-CM | POA: Diagnosis not present

## 2023-12-19 DIAGNOSIS — M5441 Lumbago with sciatica, right side: Secondary | ICD-10-CM | POA: Insufficient documentation

## 2023-12-19 DIAGNOSIS — M25511 Pain in right shoulder: Secondary | ICD-10-CM | POA: Diagnosis not present

## 2023-12-19 DIAGNOSIS — M5442 Lumbago with sciatica, left side: Secondary | ICD-10-CM | POA: Insufficient documentation

## 2023-12-19 DIAGNOSIS — Z5181 Encounter for therapeutic drug level monitoring: Secondary | ICD-10-CM | POA: Insufficient documentation

## 2023-12-19 MED ORDER — HYDROCODONE-ACETAMINOPHEN 10-325 MG PO TABS: 1.0000 | ORAL_TABLET | Freq: Four times a day (QID) | ORAL | 0 refills | Status: DC | PRN

## 2023-12-19 NOTE — Progress Notes (Signed)
 Subjective:    Patient ID: Jamie Phillips, female    DOB: [birth year], 83 y.o.   MRN: [id number]  HPI: Jamie Phillips is a 83 y.o. female who returns for follow up appointment for chronic pain and medication refill. She states her pain is located in her right shoulder, lower back and bilateral feet with tingling. She rates her pain 4. Her current exercise regime is walking  with her walker, attending physical therapy two days a week and performing stretching exercises.  Jamie Phillips Morphine  equivalent is 40.00 MME.   Last Oral Swab was Performed on 09/03/2023, it was consistent.    Pain Inventory Average Pain 6 Pain Right Now 4 My pain is aching  In the last 24 hours, has pain interfered with the following? General activity 7 Relation with others 0 Enjoyment of life 0 What TIME of day is your pain at its worst? evening Sleep (in general) Fair  Pain is worse with: walking and standing Pain improves with: medication Relief from Meds: 1  Family History  Problem Relation Age of Onset   Pulmonary embolism Mother    Sudden death Mother    Obesity Mother    Heart failure Father    Congestive Heart Failure Father    Sudden death Father    Thyroid  cancer Brother    Breast cancer Neg Hx    Social History   Socioeconomic History   Marital status: Widowed    Spouse name: Not on file   Number of children: Not on file   Years of education: Not on file   Highest education level: Not on file  Occupational History   Occupation: Retired  Tobacco Use   Smoking status: Never   Smokeless tobacco: Never  Vaping Use   Vaping status: Never Used  Substance and Sexual Activity   Alcohol  use: No    Alcohol /week: 0.0 standard drinks of alcohol    Drug use: No   Sexual activity: Not on file  Other Topics Concern   Not on file  Social History Narrative   ** Merged History Encounter **       Social Drivers of Health   Financial Resource Strain: Low Risk  (12/11/2023)   Received  from [facility]   Overall Financial Resource Strain (CARDIA)    Difficulty of Paying Living Expenses: Not hard at all  Food Insecurity: No Food Insecurity (12/11/2023)   Received from [facility]   Hunger Vital Sign    Within the past 12 months, you worried that your food would run out before you got the money to buy more.: Never true    Within the past 12 months, the food you bought just didn't last and you didn't have money to get more.: Never true  Transportation Needs: No Transportation Needs (12/11/2023)   Received from [facility] - Transportation    In the past 12 months, has lack of transportation kept you from medical appointments or from getting medications?: No    Lack of Transportation (Non-Medical): No  Physical Activity: Unknown (03/18/2017)   Received from [facility]   Exercise Vital Sign    Days of Exercise per Week: Patient declined    Minutes of Exercise per Session: Patient declined  Stress: Unknown (03/18/2017)   Received from [facility] - Occupational Stress Questionnaire    Feeling of Stress : Patient declined  Social  Connections: Moderately Integrated (11/20/2023)   Social Connection and Isolation Panel    Frequency of Communication with Friends and Family: Three times a week    Frequency of Social Gatherings with Friends and Family: Three times a week    Attends Religious Services: 1 to 4 times per year    Active Member of Clubs or Organizations: No    Attends Banker Meetings: 1 to 4 times per year    Marital Status: Widowed   Past Surgical History:  Procedure Laterality Date   APPENDECTOMY     BREAST BIOPSY Left 08/19/2004   lt bx/clip-neg   BREAST BIOPSY Left 03/05/2023   US  BX heart clip, path pending-   BREAST BIOPSY Left 03/05/2023   US  BX hydromark coil clip axilla node- path pending   BREAST  BIOPSY Left 03/05/2023   US  LT BREAST BX W LOC DEV 1ST LESION IMG BX SPEC US  GUIDE 03/05/2023 [facility]   CATARACT EXTRACTION W/PHACO Left 09/10/2022   Procedure: CATARACT EXTRACTION PHACO AND INTRAOCULAR LENS PLACEMENT (IOC) LEFT  5.88  00:31.9;  Surgeon: Jaye Fallow, MD;  Location: [facility];  Service: Ophthalmology;  Laterality: Left;  sleep apnea   COLONOSCOPY     COLONOSCOPY WITH PROPOFOL  N/A 03/10/2017   Procedure: COLONOSCOPY WITH PROPOFOL ;  Surgeon: Gaylyn Gladis PENNER, MD;  Location: [facility] ENDOSCOPY;  Service: Endoscopy;  Laterality: N/A;   DILATION AND CURETTAGE OF UTERUS     ESOPHAGOGASTRODUODENOSCOPY N/A 12/05/2023   Procedure: EGD (ESOPHAGOGASTRODUODENOSCOPY);  Surgeon: Maryruth Ole DASEN, MD;  Location: [facility] ENDOSCOPY;  Service: Endoscopy;  Laterality: N/A;   ESOPHAGOGASTRODUODENOSCOPY (EGD) WITH PROPOFOL  N/A 02/02/2019   Procedure: ESOPHAGOGASTRODUODENOSCOPY (EGD) WITH PROPOFOL ;  Surgeon: Toledo, Ladell POUR, MD;  Location: [facility] ENDOSCOPY;  Service: Endoscopy;  Laterality: N/A;   EYE SURGERY     IR EMBO ART  VEN HEMORR LYMPH EXTRAV  INC GUIDE ROADMAPPING  11/20/2023   IVC FILTER INSERTION N/A 06/11/2016   Procedure: IVC Filter Insertion;  Surgeon: Cordella KANDICE Shawl, MD;  Location: [facility] INVASIVE CV LAB;  Service: Cardiovascular;  Laterality: N/A;   IVC FILTER INSERTION N/A 11/27/2023   Procedure: IVC FILTER INSERTION;  Surgeon: Marea Selinda RAMAN, MD;  Location: [facility] LAB;  Service: Cardiovascular;  Laterality: N/A;   IVC FILTER REMOVAL N/A 05/06/2017   Procedure: IVC FILTER REMOVAL;  Surgeon: Shawl Cordella KANDICE, MD;  Location: [facility] LAB;  Service: Cardiovascular;  Laterality: N/A;   JOINT REPLACEMENT Left 06/17/2016   Knees, pt stated left and right have been replaced.   LAPAROSCOPIC APPENDECTOMY N/A 10/10/2018   Procedure: APPENDECTOMY LAPAROSCOPIC ATTEMPTED CONVERTED TO OPEN;  Surgeon: Rodolph Romano, MD;  Location: [facility];  Service:  General;  Laterality: N/A;   LAPAROSCOPY N/A 10/10/2018   Procedure: LAPAROSCOPY DIAGNOSTIC ATTEMPTED CONVERTED TO OPEN;  Surgeon: Rodolph Romano, MD;  Location: [facility];  Service: General;  Laterality: N/A;   LUMBAR LAMINECTOMY/DECOMPRESSION MICRODISCECTOMY N/A 07/09/2021   Procedure: Laminectomy and Foraminotomy - Lumbar Two-Lumbar Three - Lumbar Three-Lumbar Four - Lumbar Four-Lumbar Five;  Surgeon: Louis Shove, MD;  [facility];  Service: Neurosurgery;  Laterality: N/A;   neck fusion     OPEN REDUCTION INTERNAL FIXATION (ORIF) WRIST WITH ILIAC CREST BONE GRAFT Left    PARTIAL COLECTOMY Right 10/10/2018   Procedure: PARTIAL COLECTOMY;  Surgeon: Rodolph Romano, MD;  Location: [facility];  Service: General;  Laterality: Right;   PERIPHERAL VASCULAR CATHETERIZATION N/A 08/15/2015   Procedure: pulmonary angiogram with lysis;  Surgeon: Cordella  KANDICE Shawl, MD;  Location: [facility] LAB;  Service: Cardiovascular;  Laterality: N/A;   WRIST SURGERY Left 2000   Past Surgical History:  Procedure Laterality Date   APPENDECTOMY     BREAST BIOPSY Left 08/19/2004   lt bx/clip-neg   BREAST BIOPSY Left 03/05/2023   US  BX heart clip, path pending-   BREAST BIOPSY Left 03/05/2023   US  BX hydromark coil clip axilla node- path pending   BREAST BIOPSY Left 03/05/2023   US  LT BREAST BX W LOC DEV 1ST LESION IMG BX SPEC US  GUIDE 03/05/2023 [facility]   CATARACT EXTRACTION W/PHACO Left 09/10/2022   Procedure: CATARACT EXTRACTION PHACO AND INTRAOCULAR LENS PLACEMENT (IOC) LEFT  5.88  00:31.9;  Surgeon: Jaye Fallow, MD;  Location: [facility];  Service: Ophthalmology;  Laterality: Left;  sleep apnea   COLONOSCOPY     COLONOSCOPY WITH PROPOFOL  N/A 03/10/2017   Procedure: COLONOSCOPY WITH PROPOFOL ;  Surgeon: Gaylyn Gladis PENNER, MD;  Location: [facility] ENDOSCOPY;  Service: Endoscopy;  Laterality: N/A;   DILATION AND CURETTAGE OF UTERUS     ESOPHAGOGASTRODUODENOSCOPY N/A  12/05/2023   Procedure: EGD (ESOPHAGOGASTRODUODENOSCOPY);  Surgeon: Maryruth Ole DASEN, MD;  Location: [facility] ENDOSCOPY;  Service: Endoscopy;  Laterality: N/A;   ESOPHAGOGASTRODUODENOSCOPY (EGD) WITH PROPOFOL  N/A 02/02/2019   Procedure: ESOPHAGOGASTRODUODENOSCOPY (EGD) WITH PROPOFOL ;  Surgeon: Toledo, Ladell POUR, MD;  Location: [facility] ENDOSCOPY;  Service: Endoscopy;  Laterality: N/A;   EYE SURGERY     IR EMBO ART  VEN HEMORR LYMPH EXTRAV  INC GUIDE ROADMAPPING  11/20/2023   IVC FILTER INSERTION N/A 06/11/2016   Procedure: IVC Filter Insertion;  Surgeon: Cordella KANDICE Shawl, MD;  Location: [facility];  Service: Cardiovascular;  Laterality: N/A;   IVC FILTER INSERTION N/A 11/27/2023   Procedure: IVC FILTER INSERTION;  Surgeon: Marea Selinda RAMAN, MD;  Location: [facility];  Service: Cardiovascular;  Laterality: N/A;   IVC FILTER REMOVAL N/A 05/06/2017   Procedure: IVC FILTER REMOVAL;  Surgeon: Shawl Cordella KANDICE, MD;  Location: [facility];  Service: Cardiovascular;  Laterality: N/A;   JOINT REPLACEMENT Left 06/17/2016   Knees, pt stated left and right have been replaced.   LAPAROSCOPIC APPENDECTOMY N/A 10/10/2018   Procedure: APPENDECTOMY LAPAROSCOPIC ATTEMPTED CONVERTED TO OPEN;  Surgeon: Rodolph Romano, MD;  Location: [facility];  Service: General;  Laterality: N/A;   LAPAROSCOPY N/A 10/10/2018   Procedure: LAPAROSCOPY DIAGNOSTIC ATTEMPTED CONVERTED TO OPEN;  Surgeon: Rodolph Romano, MD;  Location: [facility];  Service: General;  Laterality: N/A;   LUMBAR LAMINECTOMY/DECOMPRESSION MICRODISCECTOMY N/A 07/09/2021   Procedure: Laminectomy and Foraminotomy - Lumbar Two-Lumbar Three - Lumbar Three-Lumbar Four - Lumbar Four-Lumbar Five;  Surgeon: Louis Shove, MD;  Location[facility];  Service: Neurosurgery;  Laterality: N/A;   neck fusion     OPEN REDUCTION INTERNAL FIXATION (ORIF) WRIST WITH ILIAC CREST BONE GRAFT Left    PARTIAL COLECTOMY Right 10/10/2018   Procedure: PARTIAL  COLECTOMY;  Surgeon: Rodolph Romano, MD;  Location: [facility];  Service: General;  Laterality: Right;   PERIPHERAL VASCULAR CATHETERIZATION N/A 08/15/2015   Procedure: pulmonary angiogram with lysis;  Surgeon: Cordella KANDICE Shawl, MD;  Location: [facility] INVASIVE CV LAB;  Service: Cardiovascular;  Laterality: N/A;   WRIST SURGERY Left 2000   Past Medical History:  Diagnosis Date   Adnexal cyst    Anginal pain (HCC)    atypical chest pain   Anxiety    Arthritis    Back pain    Cancer (  HCC)    cll   Chronic kidney disease    CLL (chronic lymphocytic leukemia) (HCC)    DDD (degenerative disc disease), lumbar    Depression    Epigastric pain    GERD (gastroesophageal reflux disease)    Headache    Hemorrhoids    History of Bell's palsy    Hypercholesteremia    Hyperlipidemia    Hypertension    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)    Macular degeneration    MGUS (monoclonal gammopathy of unknown significance)    Morbid obesity (HCC)    Obesity    Pre-diabetes    Pulmonary embolism (HCC)    PVD (peripheral vascular disease) (HCC)    RLS (restless legs syndrome)    Sleep apnea    CPAP   BP 123/83   Pulse 73   Ht 5' 4 (1.626 m)   Wt 242 lb (109.8 kg)   SpO2 93%   BMI 41.54 kg/m   Opioid Risk Score:   Fall Risk Score:  `1  Depression screen PHQ 2/9     07/04/2023    2:03 PM 03/31/2023    2:03 PM 09/23/2022    1:07 PM 06/24/2022    1:46 PM 03/11/2022    1:37 PM 11/16/2021   11:18 AM 11/05/2021   10:42 AM  Depression screen PHQ 2/9  Decreased Interest 3 0 0 1 0 2 0  Down, Depressed, Hopeless 3 0 0 1 0 0 0  PHQ - 2 Score 6 0 0 2 0 2 0  Altered sleeping      1   Tired, decreased energy      1   Change in appetite      0   Feeling bad or failure about yourself       0   Trouble concentrating      0   Moving slowly or fidgety/restless      0   Suicidal thoughts      0   PHQ-9 Score      4   Difficult doing work/chores      Very difficult        Review of Systems  Musculoskeletal:  Positive for back pain and gait problem.  All other systems reviewed and are negative.      Objective:   Physical Exam Vitals and nursing note reviewed.  Constitutional:      Appearance: Normal appearance.  Cardiovascular:     Rate and Rhythm: Normal rate and regular rhythm.  Pulmonary:     Effort: Pulmonary effort is normal.     Breath sounds: Normal breath sounds.  Musculoskeletal:     Comments: Normal Muscle Bulk and Muscle Testing Reveals:  Upper Extremities: Right: Decreased ROM  90 Degrees and Muscle Strength 5/5 Right AC Joint Tenderness Left Upper Extremity: Full ROM and Muscle Strength 5/5 Lumbar Hypersensitivity Lower Extremities: Full ROM and Muscle Strength 5/5 Arises from Table slowly Narrow Based  Gait     Skin:    General: Skin is warm and dry.  Neurological:     Mental Status: She is alert and oriented to person, place, and time.  Psychiatric:        Mood and Affect: Mood normal.        Behavior: Behavior normal.           Assessment & Plan:  Chronic Low Back Pain w/ sciatica: Lumbar Spinal Stenosis: Continue HEP as Tolerated. Continue to monitor. 12/19/2023 Chronic  Right Shoulder Pain: Continue Physical Therapy. Continue current medication regimen. Continue to Monitor. 12/19/2023 Neuropathic Pain Bilateral Feet: Continue current medication regimen. Continue to monitor. 12/19/2023 Chronic Pain Syndrome: Refilled:Hydrocodone  10/325 mg one tablet every 6 hours as needed or pain #120. Second script sent or the following month. We will continue the opioid monitoring program, this consists of regular clinic visits, examinations, urine drug screen, pill counts as well as use of [place]  Controlled Substance Reporting system. A 12 month History has been reviewed on the [place]  Controlled Substance Reporting System  on 08/ 29//2025   F/U in 2 months

## 2023-12-24 DIAGNOSIS — N183 Chronic kidney disease, stage 3 unspecified: Secondary | ICD-10-CM | POA: Diagnosis not present

## 2023-12-24 DIAGNOSIS — I129 Hypertensive chronic kidney disease with stage 1 through stage 4 chronic kidney disease, or unspecified chronic kidney disease: Secondary | ICD-10-CM | POA: Diagnosis not present

## 2023-12-24 DIAGNOSIS — D631 Anemia in chronic kidney disease: Secondary | ICD-10-CM | POA: Diagnosis not present

## 2023-12-24 DIAGNOSIS — K219 Gastro-esophageal reflux disease without esophagitis: Secondary | ICD-10-CM | POA: Diagnosis not present

## 2023-12-24 DIAGNOSIS — G4733 Obstructive sleep apnea (adult) (pediatric): Secondary | ICD-10-CM | POA: Diagnosis not present

## 2023-12-24 DIAGNOSIS — K5733 Diverticulitis of large intestine without perforation or abscess with bleeding: Secondary | ICD-10-CM | POA: Diagnosis not present

## 2023-12-24 DIAGNOSIS — G2581 Restless legs syndrome: Secondary | ICD-10-CM | POA: Diagnosis not present

## 2023-12-24 DIAGNOSIS — C9110 Chronic lymphocytic leukemia of B-cell type not having achieved remission: Secondary | ICD-10-CM | POA: Diagnosis not present

## 2023-12-24 DIAGNOSIS — D62 Acute posthemorrhagic anemia: Secondary | ICD-10-CM | POA: Diagnosis not present

## 2023-12-24 DIAGNOSIS — E7800 Pure hypercholesterolemia, unspecified: Secondary | ICD-10-CM | POA: Diagnosis not present

## 2023-12-24 DIAGNOSIS — R0609 Other forms of dyspnea: Secondary | ICD-10-CM | POA: Diagnosis not present

## 2023-12-24 DIAGNOSIS — Z86711 Personal history of pulmonary embolism: Secondary | ICD-10-CM | POA: Diagnosis not present

## 2023-12-28 ENCOUNTER — Encounter: Payer: Self-pay | Admitting: Registered Nurse

## 2023-12-29 DIAGNOSIS — D649 Anemia, unspecified: Secondary | ICD-10-CM | POA: Diagnosis not present

## 2023-12-30 ENCOUNTER — Encounter (HOSPITAL_COMMUNITY): Admission: RE | Payer: Self-pay | Source: Home / Self Care

## 2023-12-30 ENCOUNTER — Inpatient Hospital Stay (HOSPITAL_COMMUNITY): Admission: RE | Admit: 2023-12-30 | Source: Home / Self Care | Admitting: Neurosurgery

## 2023-12-30 DIAGNOSIS — D62 Acute posthemorrhagic anemia: Secondary | ICD-10-CM | POA: Diagnosis not present

## 2023-12-30 DIAGNOSIS — I129 Hypertensive chronic kidney disease with stage 1 through stage 4 chronic kidney disease, or unspecified chronic kidney disease: Secondary | ICD-10-CM | POA: Diagnosis not present

## 2023-12-30 DIAGNOSIS — K5733 Diverticulitis of large intestine without perforation or abscess with bleeding: Secondary | ICD-10-CM | POA: Diagnosis not present

## 2023-12-30 DIAGNOSIS — E7800 Pure hypercholesterolemia, unspecified: Secondary | ICD-10-CM | POA: Diagnosis not present

## 2023-12-30 DIAGNOSIS — D631 Anemia in chronic kidney disease: Secondary | ICD-10-CM | POA: Diagnosis not present

## 2023-12-30 DIAGNOSIS — G2581 Restless legs syndrome: Secondary | ICD-10-CM | POA: Diagnosis not present

## 2023-12-30 DIAGNOSIS — K219 Gastro-esophageal reflux disease without esophagitis: Secondary | ICD-10-CM | POA: Diagnosis not present

## 2023-12-30 DIAGNOSIS — N183 Chronic kidney disease, stage 3 unspecified: Secondary | ICD-10-CM | POA: Diagnosis not present

## 2023-12-30 DIAGNOSIS — G4733 Obstructive sleep apnea (adult) (pediatric): Secondary | ICD-10-CM | POA: Diagnosis not present

## 2023-12-30 SURGERY — ANTERIOR LATERAL LUMBAR FUSION WITH PERCUTANEOUS SCREW 2 LEVEL
Anesthesia: General | Laterality: Left

## 2024-01-02 ENCOUNTER — Other Ambulatory Visit: Payer: Self-pay

## 2024-01-02 ENCOUNTER — Encounter (HOSPITAL_COMMUNITY): Payer: Self-pay | Admitting: Neurosurgery

## 2024-01-02 ENCOUNTER — Other Ambulatory Visit: Payer: Self-pay | Admitting: Neurosurgery

## 2024-01-02 NOTE — Progress Notes (Signed)
 PCP - Layman Piety, MD Cardiologist - Cara Lovelace, MD  Chest x-ray - 11/20/23 EKG - 11/24/23 Stress Test - 11/06/20 ECHO - 11/08/20  CPAP - Does not wear. Unable to tolerate laying on back   Anesthesia review: Y  Patient verbally denies any shortness of breath, fever, cough and chest pain during phone call   -------------  SDW INSTRUCTIONS given:  Your procedure is scheduled on Monday, Sept 15th.  Report to [facility] Main Entrance A at 0915 A.M., and check in at the Admitting office.  Call this number if you have problems the morning of surgery:  [phone]   Remember:  Do not eat after midnight the night before your surgery  You may drink clear liquids until 0830 the morning of your surgery.   Clear liquids allowed are: Water, Non-Citrus Juices (without pulp), Carbonated Beverages, Clear Tea, Black Coffee Only, and Gatorade    Take these medicines the morning of surgery with A SIP OF WATER  DULoxetine  (CYMBALTA )  escitalopram  (LEXAPRO )  gabapentin  (NEURONTIN )  methocarbamol  (ROBAXIN )  pantoprazole  (PROTONIX )  pravastatin  (PRAVACHOL )  acetaminophen  (TYLENOL )-if needed   OR HYDROcodone -acetaminophen  (NORCO)-if needed   As of today, STOP taking any Aspirin  (unless otherwise instructed by your surgeon) Aleve, Naproxen, Ibuprofen , Motrin , Advil , Goody's, BC's, all herbal medications, fish oil, and all vitamins.                      Do not wear jewelry, make up, or nail polish            Do not wear lotions, powders, perfumes/colognes, or deodorant.            Do not shave 48 hours prior to surgery.  Men may shave face and neck.            Do not bring valuables to the hospital.            [facility] is not responsible for any belongings or valuables.  Do NOT Smoke (Tobacco/Vaping) 24 hours prior to your procedure If you use a CPAP at night, you may bring all equipment for your overnight stay.   Contacts, glasses, dentures or bridgework may not be worn  into surgery.      For patients admitted to the hospital, discharge time will be determined by your treatment team.   Patients discharged the day of surgery will not be allowed to drive home, and someone needs to stay with them for 24 hours.    Special instructions:   [place]- Preparing For Surgery  Before surgery, you can play an important role. Because skin is not sterile, your skin needs to be as free of germs as possible. You can reduce the number of germs on your skin by washing with CHG (chlorahexidine gluconate) Soap before surgery.  CHG is an antiseptic cleaner which kills germs and bonds with the skin to continue killing germs even after washing.    Oral Hygiene is also important to reduce your risk of infection.  Remember - BRUSH YOUR TEETH THE MORNING OF SURGERY WITH YOUR REGULAR TOOTHPASTE  Please do not use if you have an allergy to CHG or antibacterial soaps. If your skin becomes reddened/irritated stop using the CHG.  Do not shave (including legs and underarms) for at least 48 hours prior to first CHG shower. It is OK to shave your face.  Please follow these instructions carefully.   Shower the NIGHT BEFORE SURGERY and the MORNING OF SURGERY with DIAL  Soap.   Pat yourself dry with a CLEAN TOWEL.  Wear CLEAN PAJAMAS to bed the night before surgery  Place CLEAN SHEETS on your bed the night of your first shower and DO NOT SLEEP WITH PETS.   Day of Surgery: Please shower morning of surgery  Wear Clean/Comfortable clothing the morning of surgery Do not apply any deodorants/lotions.   Remember to brush your teeth WITH YOUR REGULAR TOOTHPASTE.   Questions were answered. Patient verbalized understanding of instructions.

## 2024-01-02 NOTE — Progress Notes (Signed)
 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 S/p coil embolization for active bleeding at the terminal branch of the left colic artery on 11/21/2023. S/p 1 unit PRBC. She also received 8 days Zosyn  for diverticulitis.  IR placed an IVC filter on 11/27/2023.   Evaluated by PCP Lenon Layman ORN, MD on 12/16/2023. BP and edema controlled post her recent GI bleed. HGB mostly 9 or greater since discharge. He noted plans for surgery. He wrote, Ok to do the surgery but will need to watch post hgb closely. Last HGB 9.2 on 12/29/2023.   Evaluated by cardiologist Dr. Florencio on 12/11/2023. He wrote, Pre-op clearance, mild risk, clearance provided for back surgery. Full note can be viewed in Care Everywhere.   She is a same day work-up, so she will get updated labs on arrival. She should be getting a T&S.   VS: Ht 5' 4.02 (1.626 m)   Wt 109.8 kg   BMI 41.52 kg/m  BP Readings from Last 3 Encounters:  12/19/23 123/83  12/05/23 137/78   11/28/23 107/64   Pulse Readings from Last 3 Encounters:  12/19/23 73  12/05/23 78  11/28/23 92     PROVIDERS: Lenon Layman ORN, MD is PCP Theotis Chancellor, MD is pulmonologist ([facility]). Last visit 12/24/2023. Florencio Kava, MD is cardiologist   LABS: For day of surgery as indicated. Most recent results in [facility]: CBC 12/29/2023: WBC 12.3, H/H 9.2/30.6 (up from 8.8/29/2 on 12/16/2023), PLT 354. CMP 12/16/2023: glucose 114, Na 145, K 4.6, Cr 1.0, AST 16, ALT 10. TSH 6.421 on 12/09/2023. A1c 5.9 09/09/2023.   IMAGES: PCXR 11/20/2023: FINDINGS: Portable AP semi upright view at 1239 hours. Lower lung volumes since 2022. Stable cardiac size and mediastinal contours. Allowing for portable technique the lungs are clear. No pneumothorax or pleural effusion. Paucity of bowel gas. Chronic cervical ACDF. No acute osseous abnormality identified. IMPRESSION: Low lung volumes, otherwise no acute cardiopulmonary abnormality.    EKG: 11/24/2023: Normal sinus rhythm Nonspecific T wave abnormality Abnormal ECG When compared with ECG of 20-Nov-2023 08:25, Possible Anterior infarct is no longer Present Confirmed by End, Lonni [phone]) on 12/03/2023 8:03:43 AM   CV: BLE Venous US  11/26/2023: IMPRESSION: 1. No evidence of deep venous thrombosis within either lower extremity.    Echo 11/08/2020: IMPRESSIONS   1. Left ventricular ejection fraction, by estimation, is 50 to 55%. The  left ventricle has low normal function. The left ventricle has no regional  wall motion abnormalities. Left ventricular diastolic parameters were  normal.   2. Right ventricular systolic function is normal. The right ventricular  size  is normal.   3. The mitral valve is normal in structure. Mild mitral valve  regurgitation.   4. The aortic valve is normal in structure. Aortic valve regurgitation is  not visualized.   Nuclear stress test 11/06/2020:  The study is normal.  This is a low risk study.  The  left ventricular ejection fraction is normal (55-65%).  Blood pressure demonstrated a normal response to exercise.  There was no ST segment deviation noted during stress.   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)    Macular degeneration    MGUS (monoclonal gammopathy of unknown significance)    Morbid obesity (HCC)    Obesity    Pre-diabetes    Pulmonary embolism (HCC)    PVD (peripheral vascular disease) (HCC)    RLS (restless legs syndrome)    Sleep apnea    CPAP    Past Surgical History:  Procedure Laterality Date   APPENDECTOMY     BREAST BIOPSY Left 08/19/2004   lt bx/clip-neg   BREAST BIOPSY Left 03/05/2023   US  BX heart clip, path pending-   BREAST BIOPSY Left 03/05/2023   US  BX hydromark coil clip axilla node- path pending   BREAST BIOPSY Left 03/05/2023   US  LT BREAST BX W LOC DEV 1ST LESION IMG BX SPEC US  GUIDE 03/05/2023 [facility]   CATARACT EXTRACTION W/PHACO Left 09/10/2022   Procedure: CATARACT EXTRACTION PHACO AND INTRAOCULAR LENS PLACEMENT (IOC) LEFT  5.88  00:31.9;  Surgeon: Jaye Fallow, MD;  Location: [facility];  Service: Ophthalmology;  Laterality: Left;  sleep apnea   COLONOSCOPY     COLONOSCOPY WITH PROPOFOL  N/A 03/10/2017   Procedure: COLONOSCOPY WITH PROPOFOL ;  Surgeon: Gaylyn Gladis PENNER, MD;  Location: [facility] ENDOSCOPY;  Service: Endoscopy;  Laterality: N/A;   DILATION AND CURETTAGE OF UTERUS     ESOPHAGOGASTRODUODENOSCOPY N/A 12/05/2023   Procedure: EGD  (ESOPHAGOGASTRODUODENOSCOPY);  Surgeon: Maryruth Ole DASEN, MD;  Location: [facility] ENDOSCOPY;  Service: Endoscopy;  Laterality: N/A;   ESOPHAGOGASTRODUODENOSCOPY (EGD) WITH PROPOFOL  N/A 02/02/2019   Procedure: ESOPHAGOGASTRODUODENOSCOPY (EGD) WITH PROPOFOL ;  Surgeon: Toledo, Ladell POUR, MD;  Location: [facility] ENDOSCOPY;  Service: Endoscopy;  Laterality: N/A;   EYE SURGERY     IR EMBO ART  VEN HEMORR LYMPH EXTRAV  INC GUIDE ROADMAPPING  11/20/2023   IVC FILTER INSERTION N/A 06/11/2016   Procedure: IVC Filter Insertion;  Surgeon: Cordella KANDICE Shawl, MD;  Location: [facility] INVASIVE CV LAB;  Service: Cardiovascular;  Laterality: N/A;   IVC FILTER INSERTION N/A 11/27/2023   Procedure: IVC FILTER INSERTION;  Surgeon: Marea Selinda RAMAN, MD;  Location: [facility] INVASIVE CV LAB;  Service: Cardiovascular;  Laterality: N/A;   IVC FILTER REMOVAL N/A 05/06/2017   Procedure: IVC FILTER REMOVAL;  Surgeon: Shawl Cordella KANDICE, MD;  Location: [facility] INVASIVE CV LAB;  Service: Cardiovascular;  Laterality: N/A;   JOINT REPLACEMENT  Left 06/17/2016   Knees, pt stated left and right have been replaced.   LAPAROSCOPIC APPENDECTOMY N/A 10/10/2018   Procedure: APPENDECTOMY LAPAROSCOPIC ATTEMPTED CONVERTED TO OPEN;  Surgeon: Rodolph Romano, MD;  Location: [facility] ORS;  Service: General;  Laterality: N/A;   LAPAROSCOPY N/A 10/10/2018   Procedure: LAPAROSCOPY DIAGNOSTIC ATTEMPTED CONVERTED TO OPEN;  Surgeon: Rodolph Romano, MD;  Location: [facility] ORS;  Service: General;  Laterality: N/A;   LUMBAR LAMINECTOMY/DECOMPRESSION MICRODISCECTOMY N/A 07/09/2021   Procedure: Laminectomy and Foraminotomy - Lumbar Two-Lumbar Three - Lumbar Three-Lumbar Four - Lumbar Four-Lumbar Five;  Surgeon: Louis Shove, MD;  [facility];  Service: Neurosurgery;  Laterality: N/A;   neck fusion     OPEN REDUCTION INTERNAL FIXATION (ORIF) WRIST WITH ILIAC CREST BONE GRAFT Left    PARTIAL COLECTOMY Right 10/10/2018   Procedure: PARTIAL COLECTOMY;  Surgeon:  Rodolph Romano, MD;  Location: [facility] ORS;  Service: General;  Laterality: Right;   PERIPHERAL VASCULAR CATHETERIZATION N/A 08/15/2015   Procedure: pulmonary angiogram with lysis;  Surgeon: Cordella KANDICE Shawl, MD;  Location: [facility] INVASIVE CV LAB;  Service: Cardiovascular;  Laterality: N/A;   WRIST SURGERY Left 2000    MEDICATIONS: No current facility-administered medications for this encounter.    acetaminophen  (TYLENOL ) 650 MG CR tablet   b complex vitamins capsule   calcium  carbonate (OSCAL) 1500 (600 Ca) MG TABS tablet   Cholecalciferol (VITAMIN D -3) 125 MCG (5000 UT) TABS   cholestyramine  (QUESTRAN ) 4 g packet   Cyanocobalamin (VITAMIN B-12 PO)   DULoxetine  (CYMBALTA ) 60 MG capsule   escitalopram  (LEXAPRO ) 20 MG tablet   gabapentin  (NEURONTIN ) 400 MG capsule   HYDROcodone -acetaminophen  (NORCO) 10-325 MG tablet   MAGNESIUM  PO   methocarbamol  (ROBAXIN ) 500 MG tablet   Multiple Vitamins-Minerals (PRESERVISION AREDS 2 PO)   naphazoline-glycerin  (CLEAR EYES REDNESS) 0.012-0.25 % SOLN   pantoprazole  (PROTONIX ) 40 MG tablet   pravastatin  (PRAVACHOL ) 40 MG tablet   sucralfate  (CARAFATE ) 1 g tablet   TURMERIC CURCUMIN PO    Isaiah Ruder, PA-C Surgical Short Stay/Anesthesiology [facility] Main Phone [phone] [facility] Phone [phone] 01/02/2024 2:51 PM

## 2024-01-02 NOTE — Anesthesia Preprocedure Evaluation (Addendum)
 Anesthesia Evaluation  Patient identified by MRN, date of birth, ID band Patient awake    Reviewed: Allergy & Precautions, H&P , NPO status , Patient's Chart, lab work & pertinent test results  Airway Mallampati: II  TM Distance: >3 FB Neck ROM: Full    Dental no notable dental hx. (+) Teeth Intact, Dental Advisory Given   Pulmonary sleep apnea and Continuous Positive Airway Pressure Ventilation    Pulmonary exam normal breath sounds clear to auscultation       Cardiovascular hypertension,  Rhythm:Regular Rate:Normal     Neuro/Psych  Headaches  Anxiety Depression       GI/Hepatic Neg liver ROS,GERD  Medicated,,  Endo/Other    Class 3 obesity  Renal/GU Renal InsufficiencyRenal disease  negative genitourinary   Musculoskeletal  (+) Arthritis , Osteoarthritis,    Abdominal   Peds  Hematology negative hematology ROS (+)   Anesthesia Other Findings   Reproductive/Obstetrics negative OB ROS                              Anesthesia Physical Anesthesia Plan  ASA: 3  Anesthesia Plan: General   Post-op Pain Management: Tylenol  PO (pre-op)*   Induction: Intravenous  PONV Risk Score and Plan: 4 or greater and Ondansetron , Dexamethasone  and Treatment may vary due to age or medical condition  Airway Management Planned: Oral ETT  Additional Equipment: Arterial line  Intra-op Plan:   Post-operative Plan: Extubation in OR  Informed Consent: I have reviewed the patients History and Physical, chart, labs and discussed the procedure including the risks, benefits and alternatives for the proposed anesthesia with the patient or authorized representative who has indicated his/her understanding and acceptance.     Dental advisory given  Plan Discussed with: CRNA  Anesthesia Plan Comments: (PAT note written 01/02/2024 by Allison Zelenak, PA-C.  )         Anesthesia Quick Evaluation

## 2024-01-05 ENCOUNTER — Encounter (HOSPITAL_COMMUNITY): Payer: Self-pay | Admitting: Neurosurgery

## 2024-01-05 ENCOUNTER — Other Ambulatory Visit: Payer: Self-pay

## 2024-01-05 ENCOUNTER — Inpatient Hospital Stay (HOSPITAL_COMMUNITY)
Admission: RE | Admit: 2024-01-05 | Discharge: 2024-01-06 | DRG: 448 | Disposition: A | Discharge status: Home Health | Attending: Neurosurgery | Admitting: Neurosurgery

## 2024-01-05 ENCOUNTER — Inpatient Hospital Stay (HOSPITAL_COMMUNITY): Payer: Self-pay | Admitting: Vascular Surgery

## 2024-01-05 ENCOUNTER — Encounter (HOSPITAL_COMMUNITY)
Admission: RE | Disposition: A | Discharge status: Home Health | Payer: Self-pay | Source: Home / Self Care | Attending: Neurosurgery

## 2024-01-05 ENCOUNTER — Inpatient Hospital Stay (HOSPITAL_COMMUNITY)

## 2024-01-05 DIAGNOSIS — N189 Chronic kidney disease, unspecified: Secondary | ICD-10-CM | POA: Diagnosis not present

## 2024-01-05 DIAGNOSIS — Z981 Arthrodesis status: Secondary | ICD-10-CM | POA: Diagnosis not present

## 2024-01-05 DIAGNOSIS — M4316 Spondylolisthesis, lumbar region: Principal | ICD-10-CM | POA: Diagnosis present

## 2024-01-05 DIAGNOSIS — N183 Chronic kidney disease, stage 3 unspecified: Secondary | ICD-10-CM

## 2024-01-05 DIAGNOSIS — Z8249 Family history of ischemic heart disease and other diseases of the circulatory system: Secondary | ICD-10-CM | POA: Diagnosis not present

## 2024-01-05 DIAGNOSIS — M9689 Other intraoperative and postprocedural complications and disorders of the musculoskeletal system: Secondary | ICD-10-CM | POA: Diagnosis not present

## 2024-01-05 DIAGNOSIS — M5416 Radiculopathy, lumbar region: Secondary | ICD-10-CM | POA: Diagnosis present

## 2024-01-05 DIAGNOSIS — Z808 Family history of malignant neoplasm of other organs or systems: Secondary | ICD-10-CM | POA: Diagnosis not present

## 2024-01-05 DIAGNOSIS — I129 Hypertensive chronic kidney disease with stage 1 through stage 4 chronic kidney disease, or unspecified chronic kidney disease: Secondary | ICD-10-CM | POA: Diagnosis not present

## 2024-01-05 DIAGNOSIS — Z6841 Body Mass Index (BMI) 40.0 and over, adult: Secondary | ICD-10-CM | POA: Diagnosis not present

## 2024-01-05 DIAGNOSIS — Z856 Personal history of leukemia: Secondary | ICD-10-CM

## 2024-01-05 DIAGNOSIS — G2581 Restless legs syndrome: Secondary | ICD-10-CM | POA: Diagnosis not present

## 2024-01-05 DIAGNOSIS — G4733 Obstructive sleep apnea (adult) (pediatric): Secondary | ICD-10-CM | POA: Diagnosis present

## 2024-01-05 DIAGNOSIS — M48061 Spinal stenosis, lumbar region without neurogenic claudication: Secondary | ICD-10-CM | POA: Diagnosis present

## 2024-01-05 DIAGNOSIS — I10 Essential (primary) hypertension: Secondary | ICD-10-CM | POA: Diagnosis not present

## 2024-01-05 DIAGNOSIS — E66813 Obesity, class 3: Secondary | ICD-10-CM | POA: Diagnosis not present

## 2024-01-05 DIAGNOSIS — Z7901 Long term (current) use of anticoagulants: Secondary | ICD-10-CM | POA: Diagnosis not present

## 2024-01-05 DIAGNOSIS — Z86711 Personal history of pulmonary embolism: Secondary | ICD-10-CM

## 2024-01-05 DIAGNOSIS — K219 Gastro-esophageal reflux disease without esophagitis: Secondary | ICD-10-CM | POA: Diagnosis present

## 2024-01-05 DIAGNOSIS — Z8719 Personal history of other diseases of the digestive system: Secondary | ICD-10-CM | POA: Diagnosis not present

## 2024-01-05 DIAGNOSIS — Z79899 Other long term (current) drug therapy: Secondary | ICD-10-CM | POA: Diagnosis not present

## 2024-01-05 DIAGNOSIS — M4310 Spondylolisthesis, site unspecified: Principal | ICD-10-CM | POA: Diagnosis present

## 2024-01-05 DIAGNOSIS — I739 Peripheral vascular disease, unspecified: Secondary | ICD-10-CM | POA: Diagnosis present

## 2024-01-05 DIAGNOSIS — E7800 Pure hypercholesterolemia, unspecified: Secondary | ICD-10-CM | POA: Diagnosis present

## 2024-01-05 HISTORY — PX: ANTERIOR LATERAL LUMBAR FUSION WITH PERCUTANEOUS SCREW 2 LEVEL: SHX5554

## 2024-01-05 LAB — SURGICAL PCR SCREEN
MRSA, PCR: NEGATIVE
Staphylococcus aureus: NEGATIVE

## 2024-01-05 LAB — PREPARE RBC (CROSSMATCH)

## 2024-01-05 SURGERY — ANTERIOR LATERAL LUMBAR FUSION WITH PERCUTANEOUS SCREW 2 LEVEL
Anesthesia: General | Site: Spine Lumbar | Laterality: Left

## 2024-01-05 MED ORDER — POLYETHYLENE GLYCOL 3350 17 G PO PACK: 17.0000 g | PACK | Freq: Every day | ORAL | Status: DC | PRN

## 2024-01-05 MED ORDER — METHOCARBAMOL 500 MG PO TABS
500.0000 mg | ORAL_TABLET | Freq: Four times a day (QID) | ORAL | Status: DC | PRN
  Administered 2024-01-05 – 2024-01-06 (×3): 500 mg via ORAL
  Filled 2024-01-05 (×2): qty 1

## 2024-01-05 MED ORDER — SODIUM CHLORIDE 0.9 % IV SOLN
250.0000 mL | INTRAVENOUS | Status: AC
  Administered 2024-01-05: 250 mL via INTRAVENOUS

## 2024-01-05 MED ORDER — B COMPLEX-C PO TABS
1.0000 | ORAL_TABLET | Freq: Every day | ORAL | Status: DC
  Administered 2024-01-06: 1 via ORAL
  Filled 2024-01-05 (×2): qty 1

## 2024-01-05 MED ORDER — GABAPENTIN 400 MG PO CAPS
400.0000 mg | ORAL_CAPSULE | Freq: Every morning | ORAL | Status: DC
  Administered 2024-01-06: 400 mg via ORAL
  Filled 2024-01-05: qty 1

## 2024-01-05 MED ORDER — ALBUMIN HUMAN 5 % IV SOLN: INTRAVENOUS | Status: DC | PRN

## 2024-01-05 MED ORDER — FENTANYL CITRATE (PF) 250 MCG/5ML IJ SOLN
INTRAMUSCULAR | Status: DC | PRN
  Administered 2024-01-05 (×2): 50 ug via INTRAVENOUS

## 2024-01-05 MED ORDER — HYDROMORPHONE HCL 1 MG/ML IJ SOLN
INTRAMUSCULAR | Status: AC
  Filled 2024-01-05: qty 1

## 2024-01-05 MED ORDER — CEFAZOLIN SODIUM-DEXTROSE 2-4 GM/100ML-% IV SOLN
2.0000 g | INTRAVENOUS | Status: AC
  Administered 2024-01-05: 2 g via INTRAVENOUS
  Filled 2024-01-05: qty 100

## 2024-01-05 MED ORDER — VITAMIN D 25 MCG (1000 UNIT) PO TABS: 5000.0000 [IU] | ORAL_TABLET | Freq: Every day | ORAL | Status: DC

## 2024-01-05 MED ORDER — DEXAMETHASONE SODIUM PHOSPHATE 10 MG/ML IJ SOLN
INTRAMUSCULAR | Status: DC | PRN
  Administered 2024-01-05: 10 mg via INTRAVENOUS

## 2024-01-05 MED ORDER — CHLORHEXIDINE GLUCONATE CLOTH 2 % EX PADS: 6.0000 | MEDICATED_PAD | Freq: Once | CUTANEOUS | Status: DC

## 2024-01-05 MED ORDER — SODIUM CHLORIDE 0.9 % IV SOLN: INTRAVENOUS | Status: DC | PRN

## 2024-01-05 MED ORDER — DEXAMETHASONE SODIUM PHOSPHATE 10 MG/ML IJ SOLN
INTRAMUSCULAR | Status: AC
  Filled 2024-01-05: qty 1

## 2024-01-05 MED ORDER — PHENYLEPHRINE 80 MCG/ML (10ML) SYRINGE FOR IV PUSH (FOR BLOOD PRESSURE SUPPORT)
PREFILLED_SYRINGE | INTRAVENOUS | Status: AC
  Filled 2024-01-05: qty 10

## 2024-01-05 MED ORDER — 0.9 % SODIUM CHLORIDE (POUR BTL) OPTIME
TOPICAL | Status: DC | PRN
  Administered 2024-01-05: 1000 mL

## 2024-01-05 MED ORDER — METHOCARBAMOL 1000 MG/10ML IJ SOLN: 500.0000 mg | Freq: Four times a day (QID) | INTRAMUSCULAR | Status: DC | PRN

## 2024-01-05 MED ORDER — ONDANSETRON HCL 4 MG PO TABS: 4.0000 mg | ORAL_TABLET | Freq: Four times a day (QID) | ORAL | Status: DC | PRN

## 2024-01-05 MED ORDER — THROMBIN 20000 UNITS EX SOLR
CUTANEOUS | Status: AC
  Filled 2024-01-05: qty 20000

## 2024-01-05 MED ORDER — HYDROCODONE-ACETAMINOPHEN 10-325 MG PO TABS
1.0000 | ORAL_TABLET | ORAL | Status: DC | PRN
  Administered 2024-01-05 – 2024-01-06 (×5): 1 via ORAL
  Filled 2024-01-05 (×6): qty 1

## 2024-01-05 MED ORDER — ACETAMINOPHEN 325 MG PO TABS: 650.0000 mg | ORAL_TABLET | ORAL | Status: DC | PRN

## 2024-01-05 MED ORDER — SODIUM CHLORIDE 0.9% FLUSH
3.0000 mL | Freq: Two times a day (BID) | INTRAVENOUS | Status: DC
Start: 2024-01-05 — End: 2024-01-07
  Administered 2024-01-05: 3 mL via INTRAVENOUS

## 2024-01-05 MED ORDER — PROPOFOL 500 MG/50ML IV EMUL
INTRAVENOUS | Status: DC | PRN
  Administered 2024-01-05: 75 ug/kg/min via INTRAVENOUS

## 2024-01-05 MED ORDER — VITAMIN B-12 100 MCG PO TABS
100.0000 ug | ORAL_TABLET | Freq: Every morning | ORAL | Status: DC
  Administered 2024-01-06: 100 ug via ORAL
  Filled 2024-01-05 (×2): qty 1

## 2024-01-05 MED ORDER — CHOLESTYRAMINE 4 G PO PACK
4.0000 g | PACK | Freq: Every day | ORAL | Status: DC
  Filled 2024-01-05: qty 1

## 2024-01-05 MED ORDER — THROMBIN 20000 UNITS EX SOLR
CUTANEOUS | Status: DC | PRN
  Administered 2024-01-05: 20 mL via TOPICAL

## 2024-01-05 MED ORDER — VITAMIN D-3 125 MCG (5000 UT) PO TABS: 5000.0000 [IU] | ORAL_TABLET | Freq: Every morning | ORAL | Status: DC

## 2024-01-05 MED ORDER — FENTANYL CITRATE (PF) 250 MCG/5ML IJ SOLN
INTRAMUSCULAR | Status: AC
  Filled 2024-01-05: qty 5

## 2024-01-05 MED ORDER — ROCURONIUM BROMIDE 10 MG/ML (PF) SYRINGE
PREFILLED_SYRINGE | INTRAVENOUS | Status: AC
  Filled 2024-01-05: qty 10

## 2024-01-05 MED ORDER — TURMERIC CURCUMIN 500 MG PO CAPS: ORAL_CAPSULE | Freq: Every morning | ORAL | Status: DC

## 2024-01-05 MED ORDER — ORAL CARE MOUTH RINSE: 15.0000 mL | Freq: Once | OROMUCOSAL | Status: AC

## 2024-01-05 MED ORDER — SODIUM CHLORIDE 0.9% IV SOLUTION: Freq: Once | INTRAVENOUS | Status: DC

## 2024-01-05 MED ORDER — ONDANSETRON HCL 4 MG/2ML IJ SOLN
INTRAMUSCULAR | Status: AC
  Filled 2024-01-05: qty 2

## 2024-01-05 MED ORDER — PANTOPRAZOLE SODIUM 40 MG PO TBEC
40.0000 mg | DELAYED_RELEASE_TABLET | Freq: Every day | ORAL | Status: DC
  Administered 2024-01-05 – 2024-01-06 (×2): 40 mg via ORAL
  Filled 2024-01-05 (×2): qty 1

## 2024-01-05 MED ORDER — ACETAMINOPHEN 10 MG/ML IV SOLN
1000.0000 mg | Freq: Once | INTRAVENOUS | Status: AC
  Administered 2024-01-05: 1000 mg via INTRAVENOUS

## 2024-01-05 MED ORDER — ONDANSETRON HCL 4 MG/2ML IJ SOLN
INTRAMUSCULAR | Status: DC | PRN
  Administered 2024-01-05: 4 mg via INTRAVENOUS

## 2024-01-05 MED ORDER — MENTHOL 3 MG MT LOZG: 1.0000 | LOZENGE | OROMUCOSAL | Status: DC | PRN

## 2024-01-05 MED ORDER — ACETAMINOPHEN 10 MG/ML IV SOLN
INTRAVENOUS | Status: AC
  Filled 2024-01-05: qty 100

## 2024-01-05 MED ORDER — BUPIVACAINE HCL (PF) 0.25 % IJ SOLN
INTRAMUSCULAR | Status: AC
  Filled 2024-01-05: qty 30

## 2024-01-05 MED ORDER — GABAPENTIN 400 MG PO CAPS
800.0000 mg | ORAL_CAPSULE | Freq: Every day | ORAL | Status: DC
  Administered 2024-01-06: 800 mg via ORAL
  Filled 2024-01-05: qty 2

## 2024-01-05 MED ORDER — LIDOCAINE 2% (20 MG/ML) 5 ML SYRINGE
INTRAMUSCULAR | Status: AC
  Filled 2024-01-05: qty 5

## 2024-01-05 MED ORDER — CEFAZOLIN SODIUM-DEXTROSE 1-4 GM/50ML-% IV SOLN
1.0000 g | Freq: Three times a day (TID) | INTRAVENOUS | Status: AC
  Administered 2024-01-05 – 2024-01-06 (×2): 1 g via INTRAVENOUS
  Filled 2024-01-05 (×2): qty 50

## 2024-01-05 MED ORDER — LACTATED RINGERS IV SOLN: INTRAVENOUS | Status: DC

## 2024-01-05 MED ORDER — B COMPLEX VITAMINS PO CAPS: 1.0000 | ORAL_CAPSULE | Freq: Every morning | ORAL | Status: DC

## 2024-01-05 MED ORDER — PROPOFOL 10 MG/ML IV BOLUS
INTRAVENOUS | Status: DC | PRN
  Administered 2024-01-05 (×3): 30 mg via INTRAVENOUS
  Administered 2024-01-05: 70 mg via INTRAVENOUS
  Administered 2024-01-05: 40 mg via INTRAVENOUS

## 2024-01-05 MED ORDER — HYDROMORPHONE HCL 1 MG/ML IJ SOLN
0.2500 mg | INTRAMUSCULAR | Status: DC | PRN
  Administered 2024-01-05 (×4): 0.5 mg via INTRAVENOUS

## 2024-01-05 MED ORDER — SUCCINYLCHOLINE CHLORIDE 200 MG/10ML IV SOSY
PREFILLED_SYRINGE | INTRAVENOUS | Status: DC | PRN
  Administered 2024-01-05: 120 mg via INTRAVENOUS

## 2024-01-05 MED ORDER — LIDOCAINE 2% (20 MG/ML) 5 ML SYRINGE
INTRAMUSCULAR | Status: DC | PRN
  Administered 2024-01-05: 60 mg via INTRAVENOUS

## 2024-01-05 MED ORDER — DULOXETINE HCL 60 MG PO CPEP
60.0000 mg | ORAL_CAPSULE | Freq: Every day | ORAL | Status: DC
  Administered 2024-01-06 (×2): 60 mg via ORAL
  Filled 2024-01-05 (×2): qty 1

## 2024-01-05 MED ORDER — ACETAMINOPHEN 500 MG PO TABS
1000.0000 mg | ORAL_TABLET | Freq: Once | ORAL | Status: AC
Start: 2024-01-05 — End: 2024-01-05
  Administered 2024-01-05: 1000 mg via ORAL
  Filled 2024-01-05: qty 2

## 2024-01-05 MED ORDER — SUCRALFATE 1 G PO TABS
1.0000 g | ORAL_TABLET | Freq: Three times a day (TID) | ORAL | Status: DC
  Filled 2024-01-05: qty 1

## 2024-01-05 MED ORDER — BISACODYL 10 MG RE SUPP: 10.0000 mg | Freq: Every day | RECTAL | Status: DC | PRN

## 2024-01-05 MED ORDER — CALCIUM CARBONATE 1250 (500 CA) MG PO TABS
600.0000 mg | ORAL_TABLET | Freq: Every morning | ORAL | Status: DC
  Administered 2024-01-06: 1250 mg via ORAL
  Filled 2024-01-05: qty 1

## 2024-01-05 MED ORDER — BUPIVACAINE HCL (PF) 0.25 % IJ SOLN
INTRAMUSCULAR | Status: DC | PRN
  Administered 2024-01-05: 30 mL

## 2024-01-05 MED ORDER — ESCITALOPRAM OXALATE 20 MG PO TABS
20.0000 mg | ORAL_TABLET | Freq: Every morning | ORAL | Status: DC
Start: 2024-01-06 — End: 2024-01-07
  Administered 2024-01-06: 20 mg via ORAL
  Filled 2024-01-05: qty 1

## 2024-01-05 MED ORDER — HYDROMORPHONE HCL 1 MG/ML IJ SOLN
1.0000 mg | INTRAMUSCULAR | Status: DC | PRN
  Administered 2024-01-05: 1 mg via INTRAVENOUS
  Filled 2024-01-05: qty 1

## 2024-01-05 MED ORDER — MAGNESIUM OXIDE -MG SUPPLEMENT 400 (240 MG) MG PO TABS
400.0000 mg | ORAL_TABLET | Freq: Every day | ORAL | Status: DC
  Administered 2024-01-06: 400 mg via ORAL
  Filled 2024-01-05: qty 1

## 2024-01-05 MED ORDER — ONDANSETRON HCL 4 MG/2ML IJ SOLN: 4.0000 mg | Freq: Four times a day (QID) | INTRAMUSCULAR | Status: DC | PRN

## 2024-01-05 MED ORDER — AMISULPRIDE (ANTIEMETIC) 5 MG/2ML IV SOLN
INTRAVENOUS | Status: AC
  Filled 2024-01-05: qty 2

## 2024-01-05 MED ORDER — FLEET ENEMA RE ENEM: 1.0000 | ENEMA | Freq: Once | RECTAL | Status: DC | PRN

## 2024-01-05 MED ORDER — PHENOL 1.4 % MT LIQD: 1.0000 | OROMUCOSAL | Status: DC | PRN

## 2024-01-05 MED ORDER — GABAPENTIN 400 MG PO CAPS: 400.0000 mg | ORAL_CAPSULE | ORAL | Status: DC

## 2024-01-05 MED ORDER — CHLORHEXIDINE GLUCONATE 0.12 % MT SOLN
15.0000 mL | Freq: Once | OROMUCOSAL | Status: AC
  Administered 2024-01-05: 15 mL via OROMUCOSAL
  Filled 2024-01-05: qty 15

## 2024-01-05 MED ORDER — SODIUM CHLORIDE 0.9% FLUSH: 3.0000 mL | INTRAVENOUS | Status: DC | PRN

## 2024-01-05 MED ORDER — PRAVASTATIN SODIUM 40 MG PO TABS
40.0000 mg | ORAL_TABLET | Freq: Every morning | ORAL | Status: DC
Start: 2024-01-06 — End: 2024-01-07
  Administered 2024-01-06: 40 mg via ORAL
  Filled 2024-01-05: qty 1

## 2024-01-05 MED ORDER — ACETAMINOPHEN 650 MG RE SUPP: 650.0000 mg | RECTAL | Status: DC | PRN

## 2024-01-05 MED ORDER — MAGNESIUM 200 MG PO TABS
ORAL_TABLET | Freq: Every morning | ORAL | Status: DC
Start: 2024-01-06 — End: 2024-01-05

## 2024-01-05 SURGICAL SUPPLY — 49 items
BAG COUNTER SPONGE SURGICOUNT (BAG) ×2 IMPLANT
BENZOIN TINCTURE PRP APPL 2/3 (GAUZE/BANDAGES/DRESSINGS) ×2 IMPLANT
BLADE CLIPPER SURG (BLADE) IMPLANT
BONE MATRIX OSTEOCEL PRO MED (Bone Implant) IMPLANT
BUR MATCHSTICK NEURO 3.0 LAGG (BURR) IMPLANT
CAGE MODULUS XL 12X18X55 - 10 (Cage) IMPLANT
CAGE MODULUS XL 12X18X60 - 10 (Cage) IMPLANT
COVER BACK TABLE 60X90IN (DRAPES) ×2 IMPLANT
DERMABOND ADVANCED .7 DNX12 (GAUZE/BANDAGES/DRESSINGS) ×4 IMPLANT
DRAPE C-ARM 42X72 X-RAY (DRAPES) ×2 IMPLANT
DRAPE C-ARMOR (DRAPES) ×2 IMPLANT
DRAPE LAPAROTOMY 100X72X124 (DRAPES) ×2 IMPLANT
DRAPE SURG 17X23 STRL (DRAPES) ×4 IMPLANT
DRSG OPSITE POSTOP 3X4 (GAUZE/BANDAGES/DRESSINGS) IMPLANT
DRSG OPSITE POSTOP 4X6 (GAUZE/BANDAGES/DRESSINGS) IMPLANT
DURAPREP 26ML APPLICATOR (WOUND CARE) ×2 IMPLANT
ELECT NVM5 SURFACE MEP/EMG (ELECTRODE) IMPLANT
ELECTRODE REM PT RTRN 9FT ADLT (ELECTROSURGICAL) ×2 IMPLANT
GAUZE 4X4 16PLY ~~LOC~~+RFID DBL (SPONGE) IMPLANT
GLOVE BIO SURGEON STRL SZ 6 (GLOVE) ×2 IMPLANT
GLOVE BIOGEL PI IND STRL 6.5 (GLOVE) ×2 IMPLANT
GLOVE ECLIPSE 9.0 STRL (GLOVE) ×2 IMPLANT
GLOVE EXAM NITRILE XL STR (GLOVE) IMPLANT
GOWN STRL REUS W/ TWL LRG LVL3 (GOWN DISPOSABLE) IMPLANT
GOWN STRL REUS W/ TWL XL LVL3 (GOWN DISPOSABLE) ×4 IMPLANT
GOWN STRL REUS W/TWL 2XL LVL3 (GOWN DISPOSABLE) IMPLANT
GUIDEWIRE NITINOL BEVEL TIP (WIRE) IMPLANT
KIT BASIN OR (CUSTOM PROCEDURE TRAY) ×2 IMPLANT
KIT DILATOR XLIF 5 (KITS) IMPLANT
KIT SURGICAL ACCESS MAXCESS 4 (KITS) IMPLANT
KIT TURNOVER KIT B (KITS) ×2 IMPLANT
NDL HYPO 22X1.5 SAFETY MO (MISCELLANEOUS) ×2 IMPLANT
NDL I-PASS III (NEEDLE) IMPLANT
NEEDLE HYPO 22X1.5 SAFETY MO (MISCELLANEOUS) ×1 IMPLANT
NEEDLE I-PASS III (NEEDLE) ×1 IMPLANT
NS IRRIG 1000ML POUR BTL (IV SOLUTION) ×2 IMPLANT
PACK LAMINECTOMY NEURO (CUSTOM PROCEDURE TRAY) ×2 IMPLANT
ROD SPINAL 5.5X80 TI LORDOSE (Rod) IMPLANT
SCREW LOCK RELINE 5.5 TULIP (Screw) IMPLANT
SCREW RELINE PA 6.5X45 (Screw) IMPLANT
SPONGE SURGIFOAM ABS GEL 100 (HEMOSTASIS) IMPLANT
SPONGE T-LAP 4X18 ~~LOC~~+RFID (SPONGE) IMPLANT
STRIP CLOSURE SKIN 1/2X4 (GAUZE/BANDAGES/DRESSINGS) ×2 IMPLANT
SUT VIC AB 2-0 CT1 18 (SUTURE) ×4 IMPLANT
SUT VIC AB 3-0 SH 8-18 (SUTURE) ×4 IMPLANT
TOWEL GREEN STERILE (TOWEL DISPOSABLE) ×2 IMPLANT
TOWEL GREEN STERILE FF (TOWEL DISPOSABLE) ×2 IMPLANT
TRAY FOLEY MTR SLVR 16FR STAT (SET/KITS/TRAYS/PACK) ×2 IMPLANT
WATER STERILE IRR 1000ML POUR (IV SOLUTION) ×2 IMPLANT

## 2024-01-05 NOTE — Anesthesia Procedure Notes (Signed)
 Arterial Line Insertion Start/End9/15/2025 1:00 PM, 01/05/2024 1:05 PM Performed by: Epifanio Fallow, MD, anesthesiologist  Patient location: Pre-op. Preanesthetic checklist: patient identified, IV checked, site marked, risks and benefits discussed, surgical consent, monitors and equipment checked, pre-op evaluation, timeout performed and anesthesia consent Lidocaine  1% used for infiltration Right, radial was placed Catheter size: 20 G Hand hygiene performed  and maximum sterile barriers used   Attempts: 1 Procedure performed without using ultrasound guided technique. Following insertion, dressing applied. Post procedure assessment: normal and unchanged  Patient tolerated the procedure well with no immediate complications.

## 2024-01-05 NOTE — Brief Op Note (Signed)
 01/05/2024  3:15 PM  PATIENT:  Jamie Phillips  83 y.o. female  PRE-OPERATIVE DIAGNOSIS:  Spondylolisthesis site unspecified  POST-OPERATIVE DIAGNOSIS:  Spondylolisthesis site unspecified  PROCEDURE:  Procedure(s): ANTERIOR LATERAL LUMBAR FUSION WITH PERCUTANEOUS SCREW LUMBAR THREE-FOUR, LUMBAR FOUR-FIVE WITH PERCUTANEOUS PEDICLE SCREWS (Left)  SURGEON:  Surgeons and Role:    DEWAINE Louis Shove, MD - Primary  PHYSICIAN ASSISTANT:   ASSISTANTSBETHA Jennetta PIETY   ANESTHESIA:   general  EBL:  100 mL   BLOOD ADMINISTERED:none  DRAINS: none   LOCAL MEDICATIONS USED:  MARCAINE      SPECIMEN:  No Specimen  DISPOSITION OF SPECIMEN:  N/A  COUNTS:  YES  TOURNIQUET:  * No tourniquets in log *  DICTATION: .Dragon Dictation  PLAN OF CARE: Admit to inpatient   PATIENT DISPOSITION:  PACU - hemodynamically stable.   Delay start of Pharmacological VTE agent (>24hrs) due to surgical blood loss or risk of bleeding: yes

## 2024-01-05 NOTE — Anesthesia Procedure Notes (Signed)
 Arterial Line Insertion Start/End9/15/2025 10:46 AM, 01/05/2024 10:49 AM Performed by: Richarda Silvano HERO, CRNA  Lidocaine  1% used for infiltration Left, radial was placed Catheter size: 20 G Hand hygiene performed , maximum sterile barriers used  and Seldinger technique used Allen's test indicative of satisfactory collateral circulation Attempts: 1 Procedure performed without using ultrasound guided technique. Following insertion, Biopatch and dressing applied. Post procedure assessment: normal  Patient tolerated the procedure well with no immediate complications.

## 2024-01-05 NOTE — H&P (Signed)
 Jamie Phillips is an 83 y.o. female.   Chief Complaint: Back pain HPI: 83 year old female remotely status post lumbar decompressive surgery presents with worsening back pain with intermittent right lower extremity pain which is failed conservative manage.  Workup demonstrates evidence of unstable degenerative/postlaminectomy spondylolisthesis at L3-4 and L4-5.  Patient presents now for L3-4 and L4-5 anterior lateral retroperitoneal decompression and fusion in hopes improving her symptoms.  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)    Macular degeneration    MGUS (monoclonal gammopathy of unknown significance)    Morbid obesity (HCC)    Obesity    Pre-diabetes    Pulmonary embolism (HCC)    PVD (peripheral vascular disease) (HCC)    RLS (restless legs syndrome)    Sleep apnea    CPAP    Past Surgical History:  Procedure Laterality Date   APPENDECTOMY     BREAST BIOPSY Left 08/19/2004   lt bx/clip-neg   BREAST BIOPSY Left 03/05/2023   US  BX heart clip, path pending-   BREAST BIOPSY Left 03/05/2023   US  BX hydromark coil clip axilla node- path pending   BREAST BIOPSY Left 03/05/2023   US  LT BREAST BX W LOC DEV 1ST LESION IMG BX SPEC US  GUIDE 03/05/2023 [facility]   CATARACT EXTRACTION W/PHACO Left 09/10/2022   Procedure: CATARACT EXTRACTION PHACO AND INTRAOCULAR LENS PLACEMENT (IOC) LEFT  5.88  00:31.9;  Surgeon: Jaye Fallow, MD;  Location: [facility];  Service: Ophthalmology;  Laterality:  Left;  sleep apnea   COLONOSCOPY     COLONOSCOPY WITH PROPOFOL  N/A 03/10/2017   Procedure: COLONOSCOPY WITH PROPOFOL ;  Surgeon: Gaylyn Gladis PENNER, MD;  Location: [facility] ENDOSCOPY;  Service: Endoscopy;  Laterality: N/A;   DILATION AND CURETTAGE OF UTERUS     ESOPHAGOGASTRODUODENOSCOPY N/A 12/05/2023   Procedure: EGD (ESOPHAGOGASTRODUODENOSCOPY);  Surgeon: Maryruth Ole DASEN, MD;  Location: [facility] ENDOSCOPY;  Service: Endoscopy;  Laterality: N/A;   ESOPHAGOGASTRODUODENOSCOPY (EGD) WITH PROPOFOL  N/A 02/02/2019   Procedure: ESOPHAGOGASTRODUODENOSCOPY (EGD) WITH PROPOFOL ;  Surgeon: Toledo, Ladell POUR, MD;  Location: [facility] ENDOSCOPY;  Service: Endoscopy;  Laterality: N/A;   EYE SURGERY     IR EMBO ART  VEN HEMORR LYMPH EXTRAV  INC GUIDE ROADMAPPING  11/20/2023   IVC FILTER INSERTION N/A 06/11/2016   Procedure: IVC Filter Insertion;  Surgeon: Cordella KANDICE Shawl, MD;  Location: [facility] INVASIVE CV LAB;  Service: Cardiovascular;  Laterality: N/A;   IVC FILTER INSERTION N/A 11/27/2023   Procedure: IVC FILTER INSERTION;  Surgeon: Marea Selinda GORMAN, MD;  Location: [facility] INVASIVE CV LAB;  Service: Cardiovascular;  Laterality: N/A;   IVC FILTER REMOVAL N/A 05/06/2017   Procedure: IVC FILTER REMOVAL;  Surgeon: Shawl Cordella KANDICE, MD;  Location: [facility] INVASIVE CV LAB;  Service: Cardiovascular;  Laterality: N/A;   JOINT REPLACEMENT Left 06/17/2016   Knees, pt stated left and right have been  replaced.   LAPAROSCOPIC APPENDECTOMY N/A 10/10/2018   Procedure: APPENDECTOMY LAPAROSCOPIC ATTEMPTED CONVERTED TO OPEN;  Surgeon: Rodolph Romano, MD;  Location: [facility] ORS;  Service: General;  Laterality: N/A;   LAPAROSCOPY N/A 10/10/2018   Procedure: LAPAROSCOPY DIAGNOSTIC ATTEMPTED CONVERTED TO OPEN;  Surgeon: Rodolph Romano, MD;  Location: [facility] ORS;  Service: General;  Laterality: N/A;   LUMBAR LAMINECTOMY/DECOMPRESSION MICRODISCECTOMY N/A 07/09/2021   Procedure: Laminectomy and Foraminotomy - Lumbar Two-Lumbar Three - Lumbar  Three-Lumbar Four - Lumbar Four-Lumbar Five;  Surgeon: Louis Shove, MD;  [facility];  Service: Neurosurgery;  Laterality: N/A;   neck fusion     OPEN REDUCTION INTERNAL FIXATION (ORIF) WRIST WITH ILIAC CREST BONE GRAFT Left    PARTIAL COLECTOMY Right 10/10/2018   Procedure: PARTIAL COLECTOMY;  Surgeon: Rodolph Romano, MD;  Location: [facility] ORS;  Service: General;  Laterality: Right;   PERIPHERAL VASCULAR CATHETERIZATION N/A 08/15/2015   Procedure: pulmonary angiogram with lysis;  Surgeon: Cordella KANDICE Shawl, MD;  Location: [facility] INVASIVE CV LAB;  Service: Cardiovascular;  Laterality: N/A;   WRIST SURGERY Left 2000    Family History  Problem Relation Age of Onset   Pulmonary embolism Mother    Sudden death Mother    Obesity Mother    Heart failure Father    Congestive Heart Failure Father    Sudden death Father    Thyroid  cancer Brother    Breast cancer Neg Hx    Social History:  reports that she has never smoked. She has never used smokeless tobacco. She reports that she does not drink alcohol  and does not use drugs.  Allergies: No Known Allergies  Medications Prior to Admission  Medication Sig Dispense Refill   acetaminophen  (TYLENOL ) 650 MG CR tablet Take 650-1,300 mg by mouth every 8 (eight) hours as needed for pain. TYLENOL  ARTHRITIS     b complex vitamins capsule Take 1 capsule by mouth in the morning.     calcium  carbonate (OSCAL) 1500 (600 Ca) MG TABS tablet Take 600 mg of elemental calcium  by mouth in the morning.     Cholecalciferol (VITAMIN D -3) 125 MCG (5000 UT) TABS Take 5,000 Units by mouth in the morning.     cholestyramine  (QUESTRAN ) 4 g packet Take 1 packet (4 g total) by mouth 3 (three) times daily with meals. (Patient taking differently: Take 4 g by mouth daily before supper.) 60 each 0   Cyanocobalamin  (VITAMIN B-12 PO) Take 1 tablet by mouth in the morning.     DULoxetine  (CYMBALTA ) 60 MG capsule Take 1 capsule (60 mg total) by mouth daily. With Other SSRI  90 capsule 3   escitalopram  (LEXAPRO ) 20 MG tablet Take 20 mg by mouth in the morning.     gabapentin  (NEURONTIN ) 400 MG capsule Take 1 capsule (400 mg total) by mouth 4 (four) times daily. Take 1 pill in Am and 2 pills at night- for nerve pain (Patient taking differently: Take 400-800 mg by mouth See admin instructions. Take 1 capsule (400 mg) by mouth in the morning, take 1 capsule (400 mg) by mouth with lunch & take 2 capsules (800 mg) by mouth at night.) 360 capsule 1   HYDROcodone -acetaminophen  (NORCO) 10-325 MG tablet Take 1 tablet by mouth every 6 (six) hours as needed. 120 tablet 0   MAGNESIUM  PO Take 1 tablet by mouth in the morning.     methocarbamol  (ROBAXIN ) 500 MG tablet TAKE 1 TABLET (500 MG TOTAL) EVERY 8 (EIGHT) HOURS AS NEEDED FOR MUSCLE SPASMS. (Patient  taking differently: Take 500 mg by mouth See admin instructions. Take 1 tablet (500 mg) by mouth scheduled every morning, may take an additional dose (500 mg) by mouth if needed for muscle spasms.) 180 tablet 5   Multiple Vitamins-Minerals (PRESERVISION AREDS 2 PO) Take 1 tablet by mouth in the morning and at bedtime.     naphazoline-glycerin  (CLEAR EYES REDNESS) 0.012-0.25 % SOLN Place 1-2 drops into both eyes 4 (four) times daily as needed for eye irritation.     pantoprazole  (PROTONIX ) 40 MG tablet Take 40 mg by mouth See admin instructions. Take 1 tablet (40 mg) by mouth scheduled every morning, may take an additional dose (40 mg) by mouth if needed for acid reflux/indigestion.     pravastatin  (PRAVACHOL ) 40 MG tablet Take 40 mg by mouth in the morning.     sucralfate  (CARAFATE ) 1 g tablet Take 1 g by mouth 3 (three) times daily before meals.     TURMERIC CURCUMIN PO Take 1 capsule by mouth in the morning.      No results found for this or any previous visit (from the past 48 hours). No results found.  Pertinent items noted in HPI and remainder of comprehensive ROS otherwise negative.  Blood pressure (!) 155/77, pulse 85,  temperature 97.9 F (36.6 C), temperature source Oral, resp. rate 19, height 5' 4 (1.626 m), weight 106.1 kg, SpO2 94%.  Patient is awake and alert.  She is oriented and appropriate.  Speech is fluent.  Judgment insight intact.  Cranial nerve function normal bilateral.  Motor examination reveals intact motor strength bilaterally sensory examination some patchy distal sensory loss in both distal lower extremities.  Reflexes are hypoactive but symmetric.  No evidence of long track signs.  Gait antalgic.  Posture mildly flexed peer examination head ears eyes nose throat is unremarkable chest and abdomen are benign.  Extremities are free of major deformity. Assessment/Plan L3-4, L4-5 unstable degenerative spondylolisthesis with foraminal stenosis and radiculopathy.  Plan left L3-4 and left L4-5 anterior lateral retroperitoneal interbody decompression and fusion utilizing interbody cage and morselized allograft coupled with posterior percutaneous pedicle screw fixation.  Risks and benefits been explained.  Patient wishes to proceed.  [facility] Camreigh Michie 01/05/2024, 9:23 AM

## 2024-01-05 NOTE — Anesthesia Postprocedure Evaluation (Signed)
 Anesthesia Post Note  Patient: Jamie Phillips  Procedure(s) Performed: ANTERIOR LATERAL LUMBAR FUSION WITH PERCUTANEOUS SCREW LUMBAR THREE-FOUR, LUMBAR FOUR-FIVE WITH PERCUTANEOUS PEDICLE SCREWS (Left: Spine Lumbar)     Patient location during evaluation: PACU Anesthesia Type: General Level of consciousness: awake and alert Pain management: pain level controlled Vital Signs Assessment: post-procedure vital signs reviewed and stable Respiratory status: spontaneous breathing, nonlabored ventilation and respiratory function stable Cardiovascular status: blood pressure returned to baseline and stable Postop Assessment: no apparent nausea or vomiting Anesthetic complications: no   No notable events documented.  Last Vitals:  Vitals:   01/05/24 1734 01/05/24 1759  BP: (!) 178/76 (!) 169/78  Pulse: 76 86  Resp: 13 19  Temp: 36.5 C 36.9 C  SpO2: 100% 99%    Last Pain:  Vitals:   01/05/24 1645  TempSrc:   PainSc: 10-Worst pain ever                 Jd Mccaster,W. EDMOND

## 2024-01-05 NOTE — Plan of Care (Signed)

## 2024-01-05 NOTE — Transfer of Care (Signed)
 Immediate Anesthesia Transfer of Care Note  Patient: Jamie Phillips  Procedure(s) Performed: ANTERIOR LATERAL LUMBAR FUSION WITH PERCUTANEOUS SCREW LUMBAR THREE-FOUR, LUMBAR FOUR-FIVE WITH PERCUTANEOUS PEDICLE SCREWS (Left: Spine Lumbar)  Patient Location: PACU  Anesthesia Type:General  Level of Consciousness: drowsy and patient cooperative  Airway & Oxygen Therapy: Patient Spontanous Breathing and Patient connected to face mask oxygen  Post-op Assessment: Report given to RN and Post -op Vital signs reviewed and stable  Post vital signs: Reviewed and stable  Last Vitals:  Vitals Value Taken Time  BP 133/106 01/05/24 15:45  Temp    Pulse 89 01/05/24 15:48  Resp 19 01/05/24 15:48  SpO2 92 % 01/05/24 15:48  Vitals shown include unfiled device data.  Last Pain:  Vitals:   01/05/24 0932  TempSrc:   PainSc: 0-No pain         Complications: No notable events documented.

## 2024-01-05 NOTE — Anesthesia Procedure Notes (Addendum)
 Procedure Name: Intubation Date/Time: 01/05/2024 12:55 PM  Performed by: Jolynn Mage, CRNAPre-anesthesia Checklist: Patient identified, Patient being monitored, Timeout performed, Emergency Drugs available and Suction available Patient Re-evaluated:Patient Re-evaluated prior to induction Oxygen Delivery Method: Circle System Utilized Preoxygenation: Pre-oxygenation with 100% oxygen Induction Type: IV induction Ventilation: Mask ventilation without difficulty Laryngoscope Size: Miller and 2 Grade View: Grade I Tube type: Oral Tube size: 7.0 mm Number of attempts: 1 Airway Equipment and Method: Stylet Placement Confirmation: ETT inserted through vocal cords under direct vision, positive ETCO2 and breath sounds checked- equal and bilateral Secured at: 21 cm Tube secured with: Tape Dental Injury: Teeth and Oropharynx as per pre-operative assessment

## 2024-01-05 NOTE — Op Note (Signed)
 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  Asst. Surgeon: Jennetta, NP  Anesthesia: General  Indication: 83 year old female who with remote history of L3-L5 decompressive laminectomy presents with worsening back and lower extremity symptoms right greater than left.  Workup demonstrates evidence of unstable spondylolisthesis with foraminal stenosis at L3-4 and L4-5.  Patient has failed conservative management and presents now for decompression and fusion surgery in hopes improving her symptoms.  Operative note: After induction of anesthesia, patient was positioned in the right lateral decubitus position where she was appropriately padded.  Localizing fluoroscopy was used to align her spine and then the patient was secured to the bed frame.  The bed was flexed.  Planned incision sites were marked using fluoroscopic guidance.  Left flank abdomen and the lumbar region were prepped and draped sterilely.  Incision made overlying L4.  Dissection proceeded down to the abdominal fascia.  Blunt dissection was made inferiorly.  A secondary incision was made in the left flank.  Using blunt dissection the retroperitoneal space was entered and the peritoneal sac and contents were reflected anteriorly.  Starting first at L4-5 a dilator was then passed toward the lateral disc base under fluoroscopic guidance.  After docking into the disc space neuromonitoring was performed with direct stimulation and no evidence of any adjacent structures of the lumbar plexus were encountered.  The dilators were  sequentially enlarged.  Self-retaining retractor was placed.  The disc base was visualized.  It was directly stimulated.  No evidence of overlying nerves were encountered.  The shim was then impacted into place under fluoroscopic guidance.  The retractor was widened.  Disc base is once again examined and cleaned of soft tissue.  No evidence of adjacent nerves.  Disc base was incised.  Discectomy then performed using various instruments.  Contralateral release was performed with Cobb elevators along the superior endplate of L5 and inferior endplate of L4.  Disc base was sequentially distracted and a 12 mm implant was found to be appropriate with good snug fit.  A 12 mm x 18 mm x 60 mm 3 degree titanium printed cage from [employer] was then packed with Osteocel plus.  Endplates were prepared.  The cage was then impacted into place and confirmed to be in good position both the AP and lateral plane.  The procedure was then repeated at the L3-4 level in a similar fashion again without complications and again without evidence of any involvement of the lumbar plexus.  A 12 mm lordotic by 18 mm x 55 mm 3 degree.  Cage with morselized allograft was then impacted in place.  The patient's bed was returned to the neutral position.  Images reveal good position of the cages at the proper level with good reduction of her deformity.  Entry sites overlying the pedicles of L3-L4 and L5 on the left were ascertained.  Stab incisions were made over the pedicles.  A Jamshidi needle introducer was then advanced through the pedicle under fluoroscopic guidance.  Neuromonitoring was performed throughout during this stage as well.  Guidewires were placed into the vertebral bodies of L3-4 and 5.  Each pedicle was then tapped with a screw tap.  6.5 x 45 mm  [employer] pedicle screws were then placed at L3-L4 and L5.  This is again then using neuromonitoring and direct stabilization.  AP and lateral fluoroscopic images reveal good position of the  cages and the hardware with proper operative level.  Short segment titanium rods in place through the screw towers from L3-L5.  Locking caps were placed over the screw towers.  Locking caps were then engaged.  Next locking caps were given a final tightening.  The screw towers were removed.  Final images reveal good position of the cage and the hardware proper level with normal alignment of spine.  Wounds were then irrigated and closed with layers of Vicryl sutures.  Steri-Strips and sterile dressing were applied.  No apparent complications.  Patient tolerated procedure well and she returns to the recovery room postop.

## 2024-01-06 ENCOUNTER — Encounter (HOSPITAL_COMMUNITY): Payer: Self-pay | Admitting: Neurosurgery

## 2024-01-06 MED ORDER — OXYCODONE HCL 5 MG PO TABS: 5.0000 mg | ORAL_TABLET | ORAL | 0 refills | Status: DC | PRN

## 2024-01-06 MED ORDER — SUCRALFATE 1 G PO TABS
1.0000 g | ORAL_TABLET | Freq: Three times a day (TID) | ORAL | Status: DC
  Administered 2024-01-06 (×3): 1 g via ORAL
  Filled 2024-01-06 (×4): qty 1

## 2024-01-06 NOTE — Care Management (Addendum)
 Patient with order to DC to home today. Unit staff to provide DME needed for home.    Discussed HH recommendations with patient and provided with [facility] agency choices. Patient chose [facility] she is currently active with them. Referral accepted.   Liaison for agency has been notified of DC. Information added to AVS  Patient will have family/ friends provide transportation home. No other TOC needs identified for DC

## 2024-01-06 NOTE — Evaluation (Signed)
 Physical Therapy Brief Evaluation and Discharge Note Patient Details Name: Jamie Phillips MRN: [id number] DOB: [birth year] Today's Date: 01/06/2024   History of Present Illness  83 y.o. female admitted 9/15 and underwent  Left L3-4 and left L4-5 anterior lateral retroperitoneal interbody decompression and fusion utilizing interbody cage and morselized allograft.  Recent admission [facility]  11/20/2023 - 11/28/2023 for GI bleed due to diverticular bleed and in setting of long term Eliquis . S/p coil embolization for active bleeding at the terminal branch of the left colic artery on 11/21/2023. S/p 1 unit PRBC.  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  Clinical Impression  Patient evaluated by Physical Therapy with no further acute PT needs identified. All education has been completed and the patient has no further questions. Previously independent, using SPC inside, rollator outside. Reports limited tolerance with prolonged standing PTA. Pt able to transfer and ambulate >100 feet with RW at a supervision level today. Antalgic with trendelenburg on Rt. Reviewed precautions and handout provided. Safely navigates 3 steps similar to home set up at [facility] level using bil rails. States family will be available to assist (son and 50 y.o. granddaughter that lives with her.) Was getting HHPT and making progress after recent admission for diverticulitis. See below for any follow-up Physical Therapy or equipment needs. PT is signing off. Thank you for this referral.        PT Assessment All further PT needs can be met in the next venue of care  Assistance Needed at Discharge  Set up Supervision/Assistance    Equipment  Recommendations None recommended by PT  Recommendations for Other Services       Precautions/Restrictions Precautions Precautions: Back Precaution Booklet Issued: Yes (comment) Recall of Precautions/Restrictions: Intact Required Braces or Orthoses: Spinal Brace Spinal Brace: Lumbar corset;Applied in sitting position Restrictions Weight Bearing Restrictions Per Provider Order: No        Mobility  Bed Mobility       General bed mobility comments: In recliner  Transfers Overall transfer level: Needs assistance Equipment used: Rolling walker (2 wheels) Transfers: Sit to/from Stand Sit to Stand: Supervision           General transfer comment: Supervision for safety, cues for hand placement. No physical assist. Cues to use hand to control descent.    Ambulation/Gait Ambulation/Gait assistance: Supervision Gait Distance (Feet): 120 Feet Assistive device: Rolling walker (2 wheels) Gait Pattern/deviations: Step-through pattern, Decreased stride length, Antalgic, Trendelenburg Gait Speed: Below normal General Gait Details: Cues for safe AD use with RW to maximize safety and support as needed. Moderately antalgic with slight trendelenburg Rt. RW provided adequate support without any overt LOB during bout. Supervision for safety.  Home Activity Instructions Home Activity Instructions: Multiple short ambulatory bouts per day with family supervision. RW for support at all times. Follow back precautions. Do not sleep in brace.  Stairs Stairs: Yes Stairs assistance: Contact guard assist Stair Management: Two rails, Step to pattern, Forwards Number of Stairs: 3 General stair comments: Demonstrated forward and lateral approach. Prefers forward. Able to perform with CGA for safety, step to pattern, bil rails similar to home set up. Feels confident with task. States family will guard (as they usually do.)  Modified Rankin (Stroke Patients Only)        Balance Overall balance  assessment: Needs assistance Sitting-balance support: No upper extremity supported, Feet supported Sitting balance-Leahy Scale: Fair     Standing balance support: No upper extremity supported Standing balance-Leahy Scale: Fair            Pertinent Vitals/Pain PT - Brief Vital Signs All Vital Signs Stable: Yes Pain Assessment Pain Assessment: Faces Faces Pain Scale: Hurts little more Pain Location: back Pain Descriptors / Indicators: Aching, Operative site guarding Pain Intervention(s): Limited activity within patient's tolerance, Monitored during session, Repositioned     Home Living Family/patient expects to be discharged to:: Private residence Living Arrangements: Children Available Help at Discharge: Family;Available PRN/intermittently Home Environment: Stairs to enter  Stairs-Number of Steps: 3 (bil rails) Home Equipment: Cane - single point;Rollator (4 wheels);Grab bars - tub/shower;Shower Counsellor (2 wheels)        Prior Function Level of Independence: Independent with assistive device(s) Comments: spc indoors rollator outside    UE/LE Assessment   UE ROM/Strength/Tone/Coordination:  (Defer to OT)    LE ROM/Strength/Tone/Coordination: Generalized weakness      Communication   Communication Communication: No apparent difficulties     Cognition Overall Cognitive Status: Appears within functional limits for tasks assessed/performed       General Comments      Exercises General Exercises - Lower Extremity Ankle Circles/Pumps: AROM, Both, 10 reps, Seated Long Arc Quad: Strengthening, Both, 10 reps, Seated   Assessment/Plan    PT Problem List Decreased strength;Decreased range of motion;Decreased activity tolerance;Decreased balance;Decreased mobility;Decreased knowledge of precautions;Pain       PT Visit Diagnosis Unsteadiness on feet (R26.81);Other abnormalities of gait and mobility (R26.89);Muscle weakness (generalized)  (M62.81);Difficulty in walking, not elsewhere classified (R26.2);Pain    No Skilled PT     Co-evaluation                AMPAC 6 Clicks Help needed turning from your back to your side while in a flat bed without using bedrails?: None Help needed moving from lying on your back to sitting on the side of a flat bed without using bedrails?: A Little Help needed moving to and from a bed to a chair (including a wheelchair)?: A Little Help needed standing up from a chair using your arms (e.g., wheelchair or bedside chair)?: A Little Help needed to walk in hospital room?: A Little Help needed climbing 3-5 steps with a railing? : A Little 6 Click Score: 19      End of Session Equipment Utilized During Treatment: Gait belt;Back brace Activity Tolerance: Patient tolerated treatment well Patient left: in chair;with call bell/phone within reach;with nursing/sitter in room Nurse Communication: Mobility status PT Visit Diagnosis: Unsteadiness on feet (R26.81);Other abnormalities of gait and mobility (R26.89);Muscle weakness (generalized) (M62.81);Difficulty in walking, not elsewhere classified (R26.2);Pain Pain - part of body:  (back)     Time: 0711-0728 PT Time Calculation (min) (ACUTE ONLY): 17 min  Charges:   PT Evaluation $PT Eval Low Complexity: 1 Low      Leontine Roads, PT, DPT [facility] Services Physical Therapist Office: [phone] Website: [email or web address]   Leontine GORMAN Roads  01/06/2024, 8:43 AM

## 2024-01-06 NOTE — Discharge Summary (Addendum)
 Physician Discharge Summary  Patient ID: Jamie Phillips MRN: [id number] DOB/AGE: 1940-12-27 83 y.o.  Admit date: 01/05/2024 Discharge date: 01/06/2024  Admission Diagnoses:  Discharge Diagnoses:  Principal Problem:   Degenerative spondylolisthesis   Discharged Condition: good  Hospital Course: Patient have a hospital where she underwent uncomplicated left-sided L3-4 and L4-5 decompression and fusion.  Postoperatively doing well.  Standing ambulating and voiding without difficulty.  Pain well-controlled.  Likely discharge home.  Consults:   Significant Diagnostic Studies:   Treatments:   Discharge Exam: Blood pressure 111/60, pulse 84, temperature 98.7 F (37.1 C), temperature source Oral, resp. rate 17, height 5' 4 (1.626 m), weight 106.1 kg, SpO2 99%. Awake and alert.  Oriented and appropriate motor and sensory function intact.  Wounds clean and dry.  Chest movement benign.  Disposition: Discharge disposition: 01-Home or Self Care        Allergies as of 01/06/2024   No Known Allergies      Medication List     STOP taking these medications    HYDROcodone -acetaminophen  10-325 MG tablet Commonly known as: Norco       TAKE these medications    acetaminophen  650 MG CR tablet Commonly known as: TYLENOL  Take 650-1,300 mg by mouth every 8 (eight) hours as needed for pain. TYLENOL  ARTHRITIS   b complex vitamins capsule Take 1 capsule by mouth in the morning.   calcium  carbonate 1500 (600 Ca) MG Tabs tablet Commonly known as: OSCAL Take 600 mg of elemental calcium  by mouth in the morning.   cholestyramine  4 g packet Commonly known as: QUESTRAN  Take 1 packet (4 g total) by mouth 3 (three) times daily with meals. What changed: when to take this   DULoxetine  60 MG capsule Commonly known as: CYMBALTA  Take 1 capsule (60 mg total) by mouth daily. With Other SSRI   escitalopram  20 MG tablet Commonly known as: LEXAPRO  Take 20 mg by mouth in the morning.    gabapentin  400 MG capsule Commonly known as: Neurontin  Take 1 capsule (400 mg total) by mouth 4 (four) times daily. Take 1 pill in Am and 2 pills at night- for nerve pain What changed:  how much to take when to take this additional instructions   MAGNESIUM  PO Take 1 tablet by mouth in the morning.   methocarbamol  500 MG tablet Commonly known as: ROBAXIN  TAKE 1 TABLET (500 MG TOTAL) EVERY 8 (EIGHT) HOURS AS NEEDED FOR MUSCLE SPASMS. What changed: See the new instructions.   naphazoline-glycerin  0.012-0.25 % Soln Commonly known as: CLEAR EYES REDNESS Place 1-2 drops into both eyes 4 (four) times daily as needed for eye irritation.   oxyCODONE  5 MG immediate release tablet Commonly known as: Roxicodone  Take 1-2 tablets (5-10 mg total) by mouth every 4 (four) hours as needed for severe pain (pain score 7-10).   pantoprazole  40 MG tablet Commonly known as: PROTONIX  Take 40 mg by mouth See admin instructions. Take 1 tablet (40 mg) by mouth scheduled every morning, may take an additional dose (40 mg) by mouth if needed for acid reflux/indigestion.   pravastatin  40 MG tablet Commonly known as: PRAVACHOL  Take 40 mg by mouth in the morning.   PRESERVISION AREDS 2 PO Take 1 tablet by mouth in the morning and at bedtime.   sucralfate  1 g tablet Commonly known as: CARAFATE  Take 1 g by mouth 3 (three) times daily before meals.   TURMERIC CURCUMIN PO Take 1 capsule by mouth in the morning.   VITAMIN B-12 PO Take  1 tablet by mouth in the morning.   Vitamin D -3 125 MCG (5000 UT) Tabs Take 5,000 Units by mouth in the morning.               Durable Medical Equipment  (From admission, onward)           Start     Ordered   01/05/24 1738  DME Walker rolling  Once       Question:  Patient needs a walker to treat with the following condition  Answer:  Degenerative spondylolisthesis   01/05/24 1737   01/05/24 1738  DME 3 n 1  Once        01/05/24 1737             Follow-up Information     [facility] Follow up.   Specialty: Home Health Services Why: for home health services, they will call you in 1-2 days to set up an appointment to continue services Contact information: [address] [place] [address] [phone]                 Signed: [facility] [employer] Tripp Goins 01/06/2024, 11:01 AM

## 2024-01-06 NOTE — Discharge Instructions (Addendum)
 Wound Care Keep incision covered and dry until post op day 3. You may remove the Honeycomb dressing on post op day 3. Leave steri-strips on back.  They will fall off by themselves. Do not put any creams, lotions, or ointments on incision. You are fine to shower. Let water run over incision and pat dry.   Activity Walk each and every day, increasing distance each day. No lifting greater than 8 lbs.  No lifting no bending no twisting no driving . You can ride as a Dealer. If provided with back brace, wear when out of bed.  It is not necessary to wear brace in bed.  Diet Resume your normal diet.  Call Your Doctor If Any of These Occur Redness, drainage, or swelling at the wound.  Temperature greater than 101 degrees. Severe pain not relieved by pain medication. Incision starts to come apart.  Follow Up Appt Call [phone] if you have one or any problem.

## 2024-01-06 NOTE — Plan of Care (Signed)

## 2024-01-06 NOTE — Evaluation (Signed)
 Occupational Therapy Evaluation Patient Details Name: Jamie Phillips MRN: [id number] DOB: 11-Jun-1940 Today's Date: 01/06/2024   History of Present Illness   83 y.o. female admitted 9/15 and underwent  Left L3-4 and left L4-5 anterior lateral retroperitoneal interbody decompression and fusion utilizing interbody cage and morselized allograft.  Recent admission [facility]  11/20/2023 - 11/28/2023 for GI bleed due to diverticular bleed and in setting of long term Eliquis . S/p coil embolization for active bleeding at the terminal branch of the left colic artery on 11/21/2023. S/p 1 unit PRBC.  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     Clinical Impressions Pt ind at baseline with ADLs/ uses cane and rollator at baseline for mobility, lives with family who can assist PRN. Pt currently needing up to mod A for ADLs, educated on use of reacher for LB ADLs, compensatory strategies for ADLs, back precautions and, brace wear. Pt able to demo understanding during session. Pt presenting with impairments listed below, will follow acutely. Recommend HHOT at d/c.      If plan is discharge home, recommend the following:   A little help with walking and/or transfers;A little help with bathing/dressing/bathroom;Assistance with cooking/housework;Direct supervision/assist for medications management;Direct supervision/assist for financial management;Assist for transportation;Help with stairs or ramp for entrance     Functional Status Assessment   Patient has had a recent decline in their functional status and demonstrates the ability to make significant improvements in function in a reasonable and predictable amount of time.      Equipment Recommendations   None recommended by OT     Recommendations for Other Services   PT consult     Precautions/Restrictions   Precautions Precautions: Back Precaution Booklet Issued: Yes (comment) Recall of Precautions/Restrictions: Intact Required Braces or Orthoses: Spinal Brace Spinal Brace: Lumbar corset;Applied in sitting position Restrictions Weight Bearing Restrictions Per Provider Order: No     Mobility Bed Mobility               General bed mobility comments: verbally reviewed, pt sleeps in recliner    Transfers Overall transfer level: Needs assistance Equipment used: Rolling walker (2 wheels) Transfers: Sit to/from Stand Sit to Stand: Contact guard assist                  Balance Overall balance assessment: Needs assistance Sitting-balance support: No upper extremity supported, Feet supported Sitting balance-Leahy Scale: Fair     Standing balance support: No upper extremity supported Standing balance-Leahy Scale: Fair                             ADL either performed or assessed with clinical judgement   ADL Overall ADL's : Needs assistance/impaired Eating/Feeding: Set up   Grooming: Set up   Upper Body Bathing: Minimal assistance   Lower Body Bathing: Moderate assistance   Upper Body Dressing : Minimal assistance   Lower Body Dressing: Moderate assistance;Cueing for back precautions;With adaptive equipment   Toilet Transfer: Contact guard assist;Ambulation;Rolling walker (2 wheels)   Toileting- Clothing Manipulation and Hygiene: Contact guard assist       Functional mobility during ADLs: Contact guard assist       Vision Baseline Vision/History: 1 Wears glasses Vision Assessment?: No apparent visual deficits  Perception Perception: Not tested       Praxis Praxis: Not tested       Pertinent Vitals/Pain Pain Assessment Pain Assessment: Faces Pain Score: 4  Faces Pain Scale: Hurts  little more Pain Location: back Pain Descriptors / Indicators: Aching, Operative site guarding Pain Intervention(s): Limited activity within patient's tolerance, Monitored during session, Repositioned     Extremity/Trunk Assessment Upper Extremity Assessment Upper Extremity Assessment: Generalized weakness   Lower Extremity Assessment Lower Extremity Assessment: Defer to PT evaluation   Cervical / Trunk Assessment Cervical / Trunk Assessment: Back Surgery   Communication Communication Communication: No apparent difficulties   Cognition Arousal: Alert Behavior During Therapy: WFL for tasks assessed/performed Cognition: No apparent impairments                               Following commands: Intact       Cueing  General Comments   Cueing Techniques: Verbal cues      Exercises     Shoulder Instructions      Home Living Family/patient expects to be discharged to:: Private residence Living Arrangements: Children Available Help at Discharge: Family;Available PRN/intermittently Type of Home: House Home Access: Stairs to enter [employer] of Steps: 3 Entrance Stairs-Rails: Can reach both Home Layout: One level     Bathroom Shower/Tub: Producer, television/film/video: Standard Bathroom Accessibility: Yes   Home Equipment: Cane - single point;Rollator (4 wheels);Grab bars - tub/shower;Rolling Walker (2 wheels);Tub bench          Prior Functioning/Environment               Mobility Comments: cane in home, rollator outside of home ADLs Comments: ind    OT Problem List: Decreased strength;Decreased range of motion;Decreased activity tolerance;Impaired balance (sitting and/or standing);Decreased cognition;Decreased safety awareness   OT Treatment/Interventions: Self-care/ADL training;Therapeutic exercise;Energy conservation;DME and/or AE instruction;Therapeutic activities;Balance training;Patient/family education      OT  Goals(Current goals can be found in the care plan section)   Acute Rehab OT Goals Patient Stated Goal: none stated OT Goal Formulation: With patient Time For Goal Achievement: 01/20/24 Potential to Achieve Goals: Good   OT Frequency:  Min 2X/week    Co-evaluation              AM-PAC OT 6 Clicks Daily Activity     Outcome Measure Help from another person eating meals?: A Little Help from another person taking care of personal grooming?: A Little Help from another person toileting, which includes using toliet, bedpan, or urinal?: A Little Help from another person bathing (including washing, rinsing, drying)?: A Lot Help from another person to put on and taking off regular upper body clothing?: A Little Help from another person to put on and taking off regular lower body clothing?: A Lot 6 Click Score: 16   End of Session Equipment Utilized During Treatment: Rolling walker (2 wheels);Back brace Nurse Communication: Mobility status  Activity Tolerance: Patient tolerated treatment well Patient left: in chair;with call bell/phone within reach  OT Visit Diagnosis: Unsteadiness on feet (R26.81);Other abnormalities of gait and mobility (R26.89);Muscle weakness (generalized) (M62.81)                Time: 9154-9086 OT Time Calculation (min): 28 min Charges:  OT General Charges $OT Visit: 1 Visit OT Evaluation $OT Eval Low Complexity: 1 Low OT Treatments $Self Care/Home Management : 8-22 mins  Jakeisha Stricker K, OTD, OTR/L SecureChat Preferred Acute Rehab (  [phone]   Jamie Phillips 01/06/2024, 9:40 AM

## 2024-01-06 NOTE — Progress Notes (Signed)
 Patient awaiting family for discharge home, patient is at work and will come for patient after work. Patient in no acute distress nor complaints of pain nor discomfort; incision on back is clean, dry and intact; No c/o pain at this time. Room was checked and accounted for all patient's belongings; discharge instructions concerning her medications, incision care, follow up appointment and when to call the doctor as needed were all discussed with patient by RN and she expressed understanding on the instructions given

## 2024-01-09 LAB — TYPE AND SCREEN
ABO/RH(D): A POS
Antibody Screen: NEGATIVE
Unit division: 0
Unit division: 0

## 2024-01-09 LAB — BPAM RBC
Blood Product Expiration Date: 202510032359
Blood Product Expiration Date: 202510102359
Unit Type and Rh: 6200
Unit Type and Rh: 6200

## 2024-01-21 ENCOUNTER — Other Ambulatory Visit: Payer: Self-pay

## 2024-01-21 ENCOUNTER — Emergency Department
Admission: EM | Admit: 2024-01-21 | Discharge: 2024-01-21 | Disposition: A | Discharge status: Home / Self Care | Attending: Emergency Medicine | Admitting: Emergency Medicine

## 2024-01-21 DIAGNOSIS — G8929 Other chronic pain: Secondary | ICD-10-CM | POA: Diagnosis not present

## 2024-01-21 DIAGNOSIS — M549 Dorsalgia, unspecified: Secondary | ICD-10-CM | POA: Diagnosis not present

## 2024-01-21 DIAGNOSIS — M545 Low back pain, unspecified: Secondary | ICD-10-CM | POA: Diagnosis not present

## 2024-01-21 DIAGNOSIS — M5441 Lumbago with sciatica, right side: Secondary | ICD-10-CM | POA: Diagnosis not present

## 2024-01-21 MED ORDER — OXYCODONE HCL 5 MG PO TABS
10.0000 mg | ORAL_TABLET | Freq: Once | ORAL | Status: AC
  Administered 2024-01-21: 10 mg via ORAL
  Filled 2024-01-21: qty 2

## 2024-01-21 MED ORDER — METHOCARBAMOL 500 MG PO TABS
500.0000 mg | ORAL_TABLET | Freq: Once | ORAL | Status: AC
Start: 2024-01-21 — End: 2024-01-21
  Administered 2024-01-21: 500 mg via ORAL
  Filled 2024-01-21: qty 1

## 2024-01-21 MED ORDER — GABAPENTIN 300 MG PO CAPS
400.0000 mg | ORAL_CAPSULE | Freq: Once | ORAL | Status: AC
  Administered 2024-01-21: 400 mg via ORAL
  Filled 2024-01-21: qty 1

## 2024-01-21 MED ORDER — LIDOCAINE 5 % EX PTCH
2.0000 | MEDICATED_PATCH | CUTANEOUS | Status: DC
  Administered 2024-01-21: 2 via TRANSDERMAL
  Filled 2024-01-21: qty 2

## 2024-01-21 MED ORDER — GABAPENTIN 400 MG PO CAPS
400.0000 mg | ORAL_CAPSULE | Freq: Three times a day (TID) | ORAL | 0 refills | Status: DC
Start: 2024-01-21 — End: 2024-02-27

## 2024-01-21 MED ORDER — ACETAMINOPHEN 500 MG PO TABS
1000.0000 mg | ORAL_TABLET | Freq: Once | ORAL | Status: AC
  Administered 2024-01-21: 1000 mg via ORAL
  Filled 2024-01-21: qty 2

## 2024-01-21 NOTE — Discharge Instructions (Signed)
 Refill of gabapentin  sent to the pharmacy to hold you over until your mail delivery arrives  Continue all other pain medications  Return to the ED with any worsening symptoms despite this

## 2024-01-21 NOTE — ED Notes (Signed)
 Pt ambulatory to restroom with walker and returned to bed without incident.

## 2024-01-21 NOTE — ED Provider Notes (Signed)
 [facility] Provider Note    Event Date/Time   First MD Initiated Contact with Patient 01/21/24 1120     (approximate)   History   Back Pain   HPI  Jamie Phillips is a 83 y.o. female who presents to the ED for evaluation of Back Pain   I reviewed op note from 9/15 L3-L5 decompression and fusion  Patient presents to the ED due to acute on chronic low back pain.  Reports living at home with her son as had no acute injuries, falls or trauma.    She reports running out of her 400 mg gabapentin  in the past couple days and the refill arriving via mail has not yet arrived and will not for a couple more days.  Reports compliance with her oxycodone  and other analgesia but despite this and traumas has had worsening pain.  No fevers, changes to her toileting or new saddle anesthesias.   Physical Exam   Triage Vital Signs: ED Triage Vitals  Encounter Vitals Group     BP 01/21/24 1125 (!) 156/80     Girls Systolic BP Percentile --      Girls Diastolic BP Percentile --      Boys Systolic BP Percentile --      Boys Diastolic BP Percentile --      Pulse Rate 01/21/24 1125 80     Resp 01/21/24 1125 18     Temp 01/21/24 1125 98.2 F (36.8 C)     Temp Source 01/21/24 1125 Oral     SpO2 01/21/24 1124 100 %     Weight 01/21/24 1126 240 lb 1.6 oz (108.9 kg)     Height 01/21/24 1126 5' 4 (1.626 m)     Head Circumference --      Peak Flow --      Pain Score 01/21/24 1126 10     Pain Loc --      Pain Education --      Exclude from Growth Chart --     Most recent vital signs: Vitals:   01/21/24 1124 01/21/24 1125  BP:  (!) 156/80  Pulse:  80  Resp:  18  Temp:  98.2 F (36.8 C)  SpO2: 100% 100%    General: Awake, no distress.  CV:  Good peripheral perfusion.  Resp:  Normal effort.  Abd:  No distention.  MSK:  No deformity noted.  Neuro:  No focal deficits appreciated. Other:  Well-healed surgical incision to lumbar back without signs of  dehiscence or superimposed infection. Ambulatory with a walker independently without evidence of neurologic deficits   ED Results / Procedures / Treatments   Labs (all labs ordered are listed, but only abnormal results are displayed) Labs Reviewed - No data to display  EKG   RADIOLOGY   Official radiology report(s): No results found.  PROCEDURES and INTERVENTIONS:  Procedures  Medications  lidocaine  (LIDODERM ) 5 % 2 patch (2 patches Transdermal Patch Applied 01/21/24 1204)  acetaminophen  (TYLENOL ) tablet 1,000 mg (1,000 mg Oral Given 01/21/24 1203)  oxyCODONE  (Oxy IR/ROXICODONE ) immediate release tablet 10 mg (10 mg Oral Given 01/21/24 1203)  gabapentin  (NEURONTIN ) capsule 400 mg (400 mg Oral Given 01/21/24 1202)  methocarbamol  (ROBAXIN ) tablet 500 mg (500 mg Oral Given 01/21/24 1204)     IMPRESSION / MDM / ASSESSMENT AND PLAN / ED COURSE  I reviewed the triage vital signs and the nursing notes.  Differential diagnosis includes, but is not limited to, cauda equina, paraspinal  abscess or hematoma, muscular spasm, organ function  {Patient presents with symptoms of an acute illness or injury that is potentially life-threatening.  Patient presents with acute on chronic lumbar pain without red flag features, trauma and suitable for outpatient management.  Looks well and has ran out of medications at home as likely etiology of her exacerbated pain.  Home with multimodal pain she is ambulatory at her baseline with a walker on repeated assessments and believe suitable for outpatient management.  She is not getting her refill of gabapentin  for a couple days so I write a short course of gabapentin  to hold her over until this time.  Discussed return precautions.  Discharged with son.  Clinical Course as of 01/21/24 1509  Wed Jan 21, 2024  1354 Reassessed, with some assistance from me patient was able to stand and ambulate with a walker.  Discussed plan of care.  She reports feeling better  but still has back pain and asks why she has back pain.  We discussed her chronic pain [DS]    Clinical Course User Index [DS] Claudene Rover, MD     FINAL CLINICAL IMPRESSION(S) / ED DIAGNOSES   Final diagnoses:  Chronic right-sided low back pain with right-sided sciatica     Rx / DC Orders   ED Discharge Orders          Ordered    gabapentin  (NEURONTIN ) 400 MG capsule  3 times daily        01/21/24 1509             Note:  This document was prepared using Dragon voice recognition software and may include unintentional dictation errors.   Claudene Rover, MD 01/21/24 [phone]

## 2024-01-21 NOTE — ED Triage Notes (Signed)
 Pt BIB [facility] from home due to R sided back pain in the lower lumbar area. Pt recently had pacers placed in her vertebrae on the 15th of September. Pt endorses pain in her feet and claims she has ran out of her gabapentin .   AXO, NAD

## 2024-01-23 DIAGNOSIS — K5733 Diverticulitis of large intestine without perforation or abscess with bleeding: Secondary | ICD-10-CM | POA: Diagnosis not present

## 2024-01-23 DIAGNOSIS — D62 Acute posthemorrhagic anemia: Secondary | ICD-10-CM | POA: Diagnosis not present

## 2024-01-23 DIAGNOSIS — E7800 Pure hypercholesterolemia, unspecified: Secondary | ICD-10-CM | POA: Diagnosis not present

## 2024-01-23 DIAGNOSIS — I129 Hypertensive chronic kidney disease with stage 1 through stage 4 chronic kidney disease, or unspecified chronic kidney disease: Secondary | ICD-10-CM | POA: Diagnosis not present

## 2024-01-23 DIAGNOSIS — G4733 Obstructive sleep apnea (adult) (pediatric): Secondary | ICD-10-CM | POA: Diagnosis not present

## 2024-01-23 DIAGNOSIS — G2581 Restless legs syndrome: Secondary | ICD-10-CM | POA: Diagnosis not present

## 2024-01-23 DIAGNOSIS — D631 Anemia in chronic kidney disease: Secondary | ICD-10-CM | POA: Diagnosis not present

## 2024-01-23 DIAGNOSIS — N183 Chronic kidney disease, stage 3 unspecified: Secondary | ICD-10-CM | POA: Diagnosis not present

## 2024-01-23 DIAGNOSIS — K219 Gastro-esophageal reflux disease without esophagitis: Secondary | ICD-10-CM | POA: Diagnosis not present

## 2024-01-26 DIAGNOSIS — G2581 Restless legs syndrome: Secondary | ICD-10-CM | POA: Diagnosis not present

## 2024-01-26 DIAGNOSIS — E7800 Pure hypercholesterolemia, unspecified: Secondary | ICD-10-CM | POA: Diagnosis not present

## 2024-01-26 DIAGNOSIS — G4733 Obstructive sleep apnea (adult) (pediatric): Secondary | ICD-10-CM | POA: Diagnosis not present

## 2024-01-26 DIAGNOSIS — D631 Anemia in chronic kidney disease: Secondary | ICD-10-CM | POA: Diagnosis not present

## 2024-01-26 DIAGNOSIS — N183 Chronic kidney disease, stage 3 unspecified: Secondary | ICD-10-CM | POA: Diagnosis not present

## 2024-01-26 DIAGNOSIS — I129 Hypertensive chronic kidney disease with stage 1 through stage 4 chronic kidney disease, or unspecified chronic kidney disease: Secondary | ICD-10-CM | POA: Diagnosis not present

## 2024-01-26 DIAGNOSIS — K219 Gastro-esophageal reflux disease without esophagitis: Secondary | ICD-10-CM | POA: Diagnosis not present

## 2024-01-26 DIAGNOSIS — K5733 Diverticulitis of large intestine without perforation or abscess with bleeding: Secondary | ICD-10-CM | POA: Diagnosis not present

## 2024-01-27 DIAGNOSIS — G2581 Restless legs syndrome: Secondary | ICD-10-CM | POA: Diagnosis not present

## 2024-01-27 DIAGNOSIS — Z6841 Body Mass Index (BMI) 40.0 and over, adult: Secondary | ICD-10-CM | POA: Diagnosis not present

## 2024-01-27 DIAGNOSIS — I129 Hypertensive chronic kidney disease with stage 1 through stage 4 chronic kidney disease, or unspecified chronic kidney disease: Secondary | ICD-10-CM | POA: Diagnosis not present

## 2024-01-27 DIAGNOSIS — I2692 Saddle embolus of pulmonary artery without acute cor pulmonale: Secondary | ICD-10-CM | POA: Diagnosis not present

## 2024-01-27 DIAGNOSIS — D631 Anemia in chronic kidney disease: Secondary | ICD-10-CM | POA: Diagnosis not present

## 2024-01-27 DIAGNOSIS — K219 Gastro-esophageal reflux disease without esophagitis: Secondary | ICD-10-CM | POA: Diagnosis not present

## 2024-01-27 DIAGNOSIS — E7800 Pure hypercholesterolemia, unspecified: Secondary | ICD-10-CM | POA: Diagnosis not present

## 2024-01-27 DIAGNOSIS — N1831 Chronic kidney disease, stage 3a: Secondary | ICD-10-CM | POA: Diagnosis not present

## 2024-01-27 DIAGNOSIS — I2782 Chronic pulmonary embolism: Secondary | ICD-10-CM | POA: Diagnosis not present

## 2024-01-27 DIAGNOSIS — N183 Chronic kidney disease, stage 3 unspecified: Secondary | ICD-10-CM | POA: Diagnosis not present

## 2024-01-27 DIAGNOSIS — K5733 Diverticulitis of large intestine without perforation or abscess with bleeding: Secondary | ICD-10-CM | POA: Diagnosis not present

## 2024-01-27 DIAGNOSIS — D62 Acute posthemorrhagic anemia: Secondary | ICD-10-CM | POA: Diagnosis not present

## 2024-01-27 DIAGNOSIS — G4733 Obstructive sleep apnea (adult) (pediatric): Secondary | ICD-10-CM | POA: Diagnosis not present

## 2024-01-27 DIAGNOSIS — C9110 Chronic lymphocytic leukemia of B-cell type not having achieved remission: Secondary | ICD-10-CM | POA: Diagnosis not present

## 2024-01-30 ENCOUNTER — Telehealth: Payer: Self-pay | Admitting: *Deleted

## 2024-01-30 MED ORDER — OXYCODONE HCL 5 MG PO TABS
5.0000 mg | ORAL_TABLET | ORAL | 0 refills | Status: DC | PRN
Start: 2024-01-30 — End: 2024-02-20

## 2024-01-30 NOTE — Telephone Encounter (Signed)
 Mrs Lennie called and is hurting really bad. She had surgery with Dr Malcolm 01/05/24 and is still having a lot of pain. It looks like she went to the ED 01/21/24.  She says she does not follow up with Dr Lovorn until 02/20/24. She is asking for a refill on her oxycodone  5 mg.   01/21/2024 Gabapentin  400 Mg Capsule #270.00 90 Ma And    01/16/2024 Oxycodone  Hcl (Ir) 5 Mg Tablet #40.00 5 Me Ber   01/06/2024 Oxycodone  Hcl (Ir) 5 Mg Tablet #40.00 7 He Poo 12/12/2023 Hydrocodone -Acetamin 10-325 Mg #120.00 Me Lov

## 2024-01-30 NOTE — Addendum Note (Signed)
 Addended by: Eyleen Rawlinson on: 01/30/2024 04:18 PM   Modules accepted: Orders

## 2024-02-02 DIAGNOSIS — G2581 Restless legs syndrome: Secondary | ICD-10-CM | POA: Diagnosis not present

## 2024-02-02 DIAGNOSIS — Z4789 Encounter for other orthopedic aftercare: Secondary | ICD-10-CM | POA: Diagnosis not present

## 2024-02-02 DIAGNOSIS — I129 Hypertensive chronic kidney disease with stage 1 through stage 4 chronic kidney disease, or unspecified chronic kidney disease: Secondary | ICD-10-CM | POA: Diagnosis not present

## 2024-02-02 DIAGNOSIS — M4316 Spondylolisthesis, lumbar region: Secondary | ICD-10-CM | POA: Diagnosis not present

## 2024-02-02 DIAGNOSIS — N183 Chronic kidney disease, stage 3 unspecified: Secondary | ICD-10-CM | POA: Diagnosis not present

## 2024-02-02 DIAGNOSIS — E7800 Pure hypercholesterolemia, unspecified: Secondary | ICD-10-CM | POA: Diagnosis not present

## 2024-02-02 DIAGNOSIS — K219 Gastro-esophageal reflux disease without esophagitis: Secondary | ICD-10-CM | POA: Diagnosis not present

## 2024-02-02 DIAGNOSIS — D631 Anemia in chronic kidney disease: Secondary | ICD-10-CM | POA: Diagnosis not present

## 2024-02-02 DIAGNOSIS — G4733 Obstructive sleep apnea (adult) (pediatric): Secondary | ICD-10-CM | POA: Diagnosis not present

## 2024-02-03 DIAGNOSIS — H43813 Vitreous degeneration, bilateral: Secondary | ICD-10-CM | POA: Diagnosis not present

## 2024-02-03 DIAGNOSIS — H353221 Exudative age-related macular degeneration, left eye, with active choroidal neovascularization: Secondary | ICD-10-CM | POA: Diagnosis not present

## 2024-02-03 DIAGNOSIS — H353114 Nonexudative age-related macular degeneration, right eye, advanced atrophic with subfoveal involvement: Secondary | ICD-10-CM | POA: Diagnosis not present

## 2024-02-03 DIAGNOSIS — Z961 Presence of intraocular lens: Secondary | ICD-10-CM | POA: Diagnosis not present

## 2024-02-04 DIAGNOSIS — Z4789 Encounter for other orthopedic aftercare: Secondary | ICD-10-CM | POA: Diagnosis not present

## 2024-02-04 DIAGNOSIS — G2581 Restless legs syndrome: Secondary | ICD-10-CM | POA: Diagnosis not present

## 2024-02-04 DIAGNOSIS — G4733 Obstructive sleep apnea (adult) (pediatric): Secondary | ICD-10-CM | POA: Diagnosis not present

## 2024-02-04 DIAGNOSIS — K219 Gastro-esophageal reflux disease without esophagitis: Secondary | ICD-10-CM | POA: Diagnosis not present

## 2024-02-04 DIAGNOSIS — E7800 Pure hypercholesterolemia, unspecified: Secondary | ICD-10-CM | POA: Diagnosis not present

## 2024-02-04 DIAGNOSIS — N183 Chronic kidney disease, stage 3 unspecified: Secondary | ICD-10-CM | POA: Diagnosis not present

## 2024-02-04 DIAGNOSIS — M4316 Spondylolisthesis, lumbar region: Secondary | ICD-10-CM | POA: Diagnosis not present

## 2024-02-04 DIAGNOSIS — I129 Hypertensive chronic kidney disease with stage 1 through stage 4 chronic kidney disease, or unspecified chronic kidney disease: Secondary | ICD-10-CM | POA: Diagnosis not present

## 2024-02-06 DIAGNOSIS — H43813 Vitreous degeneration, bilateral: Secondary | ICD-10-CM | POA: Diagnosis not present

## 2024-02-06 DIAGNOSIS — H353221 Exudative age-related macular degeneration, left eye, with active choroidal neovascularization: Secondary | ICD-10-CM | POA: Diagnosis not present

## 2024-02-06 DIAGNOSIS — Z961 Presence of intraocular lens: Secondary | ICD-10-CM | POA: Diagnosis not present

## 2024-02-06 DIAGNOSIS — H353114 Nonexudative age-related macular degeneration, right eye, advanced atrophic with subfoveal involvement: Secondary | ICD-10-CM | POA: Diagnosis not present

## 2024-02-11 DIAGNOSIS — M4310 Spondylolisthesis, site unspecified: Secondary | ICD-10-CM | POA: Diagnosis not present

## 2024-02-12 DIAGNOSIS — I129 Hypertensive chronic kidney disease with stage 1 through stage 4 chronic kidney disease, or unspecified chronic kidney disease: Secondary | ICD-10-CM | POA: Diagnosis not present

## 2024-02-12 DIAGNOSIS — E7800 Pure hypercholesterolemia, unspecified: Secondary | ICD-10-CM | POA: Diagnosis not present

## 2024-02-12 DIAGNOSIS — K219 Gastro-esophageal reflux disease without esophagitis: Secondary | ICD-10-CM | POA: Diagnosis not present

## 2024-02-12 DIAGNOSIS — D631 Anemia in chronic kidney disease: Secondary | ICD-10-CM | POA: Diagnosis not present

## 2024-02-12 DIAGNOSIS — N183 Chronic kidney disease, stage 3 unspecified: Secondary | ICD-10-CM | POA: Diagnosis not present

## 2024-02-12 DIAGNOSIS — G2581 Restless legs syndrome: Secondary | ICD-10-CM | POA: Diagnosis not present

## 2024-02-12 DIAGNOSIS — G4733 Obstructive sleep apnea (adult) (pediatric): Secondary | ICD-10-CM | POA: Diagnosis not present

## 2024-02-12 DIAGNOSIS — M4316 Spondylolisthesis, lumbar region: Secondary | ICD-10-CM | POA: Diagnosis not present

## 2024-02-12 DIAGNOSIS — Z4789 Encounter for other orthopedic aftercare: Secondary | ICD-10-CM | POA: Diagnosis not present

## 2024-02-17 DIAGNOSIS — I129 Hypertensive chronic kidney disease with stage 1 through stage 4 chronic kidney disease, or unspecified chronic kidney disease: Secondary | ICD-10-CM | POA: Diagnosis not present

## 2024-02-20 ENCOUNTER — Encounter: Attending: Registered Nurse | Admitting: Physical Medicine and Rehabilitation

## 2024-02-20 ENCOUNTER — Encounter: Payer: Self-pay | Admitting: Physical Medicine and Rehabilitation

## 2024-02-20 VITALS — BP 129/86 | HR 83 | Ht 64.0 in | Wt 238.0 lb

## 2024-02-20 DIAGNOSIS — G8929 Other chronic pain: Secondary | ICD-10-CM | POA: Insufficient documentation

## 2024-02-20 DIAGNOSIS — M4316 Spondylolisthesis, lumbar region: Secondary | ICD-10-CM | POA: Diagnosis not present

## 2024-02-20 DIAGNOSIS — Z9889 Other specified postprocedural states: Secondary | ICD-10-CM | POA: Diagnosis not present

## 2024-02-20 DIAGNOSIS — M5441 Lumbago with sciatica, right side: Secondary | ICD-10-CM | POA: Diagnosis not present

## 2024-02-20 DIAGNOSIS — M47817 Spondylosis without myelopathy or radiculopathy, lumbosacral region: Secondary | ICD-10-CM | POA: Insufficient documentation

## 2024-02-20 DIAGNOSIS — M5417 Radiculopathy, lumbosacral region: Secondary | ICD-10-CM | POA: Diagnosis not present

## 2024-02-20 DIAGNOSIS — M5442 Lumbago with sciatica, left side: Secondary | ICD-10-CM | POA: Insufficient documentation

## 2024-02-20 MED ORDER — GABAPENTIN 400 MG PO CAPS: 400.0000 mg | ORAL_CAPSULE | Freq: Four times a day (QID) | ORAL | 1 refills | Status: AC

## 2024-02-20 MED ORDER — OXYCODONE HCL 5 MG PO TABS: 5.0000 mg | ORAL_TABLET | Freq: Four times a day (QID) | ORAL | 0 refills | Status: DC | PRN

## 2024-02-20 NOTE — Patient Instructions (Signed)
 Pt is a 83 yr old female with BMI of 41, CLL; hx of Bell's palsy; central lumbar stenosis and radiculopathy- hx of Lumbar lami L2-L5; HTN- but not lately. 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   Also now S/P IVC filter that's new and recent admission for Diverticulr GI bleed.   Has a Hemoglobin  of 8.3- should be 13 or so-  Was weak and Hemoglobin was already low due to GI bleed/diverticulitis bleed in July- so hadn't rebooted. Probably why shaking inside- so weak-     2. Pain meds- will con't Oxycodone  1 more Rx- has 51 left- will give 90  tabs- so can take 1 tab 3x/day- as needed- and this will give her 6 more weeks of meds- so should hopefully be able ot transition back to Hydrocodone - in 6 weeks ago.  Let us  know- can do 1 more month of Oxycodone  after that IF NEEDED!  3. Con't Gabapentin  400 mg 2 pills in Am and 1 tab in PM- #270- 1 refill.    4. Cannot get Iron per PCP- so needs beans- chew up well , dairy- red meat or seafood/chicken; and leafy green veggies!- Make self a salad- dark lettuce/spinach, collard greens, etc.  So we can build your blood back!  5. F/U with Eunic ein 2months and me in 4 months.

## 2024-02-20 NOTE — Progress Notes (Signed)
 Subjective:    Patient ID: Jamie Phillips, female    DOB: 01/06/1941, 83 y.o.   MRN: [id number]  HPI  Pt is a 83 yr old female with BMI of 41, CLL; hx of Bell's palsy; central lumbar stenosis and radiculopathy- hx of Lumbar lami L2-L5; HTN- but not lately. 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     Ever since had back surgery- it hurts.   Went through L side even though was hurting on R side.  Was an extensive surgery-  01/05/24-    Got  L pedicle screws and posterior rod fixation and spacers between C3-L5- - by Dr Louis.     Also bout of diverticulitis prior to that. 11/20/23- was in hospital 8 days- had GI bleed as a result of diverticulitis.    Feels like whole body/torso is shaking inside.  Hb 8.3- after diverticular bleed then back surgery-  Even got a transfusion of 1 unit pRBCs.   Feels useless. - cannot do anything! Hurts more than did before surgery-  So frustrated- near tears   Cannot cook up meal and put sheets on bed- and make her beds.   Son works 7 days/wek and helping  her.   Fell in shower 1 week after back surgery.  Caught self on cabinet.  Hit R anterior thigh/hip on Rolator- still bruised 5 weeks ago.   Went back to see PCP after surgery- had labs 01/27/24. That's where found Hb of 8.3.   PCP restarted Gabapentin - hard to know if was helping or not after restarted-   Also uses horse lineament to go to sleep- helps within 30 minutes. After gabapentin    - Taking  Only Oxycodone  for pain- will take with Tylenol  arthritis- has 51 left- after 20 days- and given 90- you took 39 pills.  So takes ~ 2 pills/day- hurts more in AM.     Pain Inventory Average Pain 5 Pain Right Now 4 My pain is constant and aching  In the last 24 hours, has pain interfered with the following? General activity 4 Relation with others  0 Enjoyment of life 7 What TIME of day is your pain at its worst? morning  Sleep (in general) Fair  Pain is worse with: walking Pain improves with: heat/ice and medication Relief from Meds: 5  Family History  Problem Relation Age of Onset   Pulmonary embolism Mother    Sudden death Mother    Obesity Mother    Heart failure Father    Congestive Heart Failure Father    Sudden death Father    Thyroid  cancer Brother    Breast cancer Neg Hx    Social History   Socioeconomic History   Marital status: Widowed    Spouse name: Not on file   Number of children: Not on file   Years of education: Not on file   Highest education level: Not on file  Occupational History   Occupation: Retired  Tobacco Use   Smoking status: Never   Smokeless tobacco: Never  Vaping Use   Vaping status: Never Used  Substance and Sexual Activity   Alcohol  use: No    Alcohol /week: 0.0 standard drinks of alcohol    Drug use: No   Sexual activity: Not on file  Other Topics Concern   Not on file  Social History  Narrative   ** Merged History Encounter **       Social Drivers of Health   Financial Resource Strain: Low Risk  (12/11/2023)   Received from [facility]   Overall Financial Resource Strain (CARDIA)    Difficulty of Paying Living Expenses: Not hard at all  Food Insecurity: No Food Insecurity (12/11/2023)   Received from [facility]   Hunger Vital Sign    Within the past 12 months, you worried that your food would run out before you got the money to buy more.: Never true    Within the past 12 months, the food you bought just didn't last and you didn't have money to get more.: Never true  Transportation Needs: No Transportation Needs (12/11/2023)   Received from [facility] - Transportation    In the past 12 months, has lack of transportation kept you from medical appointments or from getting medications?: No    Lack of  Transportation (Non-Medical): No  Physical Activity: Unknown (03/18/2017)   Received from [facility]   Exercise Vital Sign    Days of Exercise per Week: Patient declined    Minutes of Exercise per Session: Patient declined  Stress: Unknown (03/18/2017)   Received from [facility] - Occupational Stress Questionnaire    Feeling of Stress : Patient declined  Social Connections: Moderately Integrated (11/20/2023)   Social Connection and Isolation Panel    Frequency of Communication with Friends and Family: Three times a week    Frequency of Social Gatherings with Friends and Family: Three times a week    Attends Religious Services: 1 to 4 times per year    Active Member of Clubs or Organizations: No    Attends Banker Meetings: 1 to 4 times per year    Marital Status: Widowed   Past Surgical History:  Procedure Laterality Date   ANTERIOR LATERAL LUMBAR FUSION WITH PERCUTANEOUS SCREW 2 LEVEL Left 01/05/2024   Procedure: ANTERIOR LATERAL LUMBAR FUSION WITH PERCUTANEOUS SCREW LUMBAR THREE-FOUR, LUMBAR FOUR-FIVE WITH PERCUTANEOUS PEDICLE SCREWS;  Surgeon: Louis Shove, MD;  [facility]: [facility];  Service: Neurosurgery;  Laterality: Left;   APPENDECTOMY     BREAST BIOPSY Left 08/19/2004   lt bx/clip-neg   BREAST BIOPSY Left 03/05/2023   US  BX heart clip, path pending-   BREAST BIOPSY Left 03/05/2023   US  BX hydromark coil clip axilla node- path pending   BREAST BIOPSY Left 03/05/2023   US  LT BREAST BX W LOC DEV 1ST LESION IMG BX SPEC US  GUIDE 03/05/2023 [facility]   CATARACT EXTRACTION W/PHACO Left 09/10/2022   Procedure: CATARACT EXTRACTION PHACO AND INTRAOCULAR LENS PLACEMENT (IOC) LEFT  5.88  00:31.9;  Surgeon: Jaye Fallow, MD;  Location: [facility];  Service: Ophthalmology;  Laterality: Left;  sleep apnea   COLONOSCOPY     COLONOSCOPY WITH PROPOFOL  N/A 03/10/2017   Procedure:  COLONOSCOPY WITH PROPOFOL ;  Surgeon: Gaylyn Gladis PENNER, MD;  Location: [facility]COPY;  Service: Endoscopy;  Laterality: N/A;   DILATION AND CURETTAGE OF UTERUS     ESOPHAGOGASTRODUODENOSCOPY N/A 12/05/2023   Procedure: EGD (ESOPHAGOGASTRODUODENOSCOPY);  Surgeon: Maryruth Ole DASEN, MD;  Location: [facility] ENDOSCOPY;  Service: Endoscopy;  Laterality: N/A;   ESOPHAGOGASTRODUODENOSCOPY (EGD) WITH PROPOFOL  N/A 02/02/2019   Procedure: ESOPHAGOGASTRODUODENOSCOPY (EGD) WITH PROPOFOL ;  Surgeon: Toledo, Ladell POUR, MD;  Location: [facility] ENDOSCOPY;  Service: Endoscopy;  Laterality: N/A;  EYE SURGERY     IR EMBO ART  VEN HEMORR LYMPH EXTRAV  INC GUIDE ROADMAPPING  11/20/2023   IVC FILTER INSERTION N/A 06/11/2016   Procedure: IVC Filter Insertion;  Surgeon: Cordella KANDICE Shawl, MD;  Location: [facility]VAS[facility] CV LAB;  Service: Cardiovascular;  Laterality: N/A;   IVC FILTER INSERTION N/A 11/27/2023   Procedure: IVC FILTER INSERTION;  Surgeon: Marea Selinda RAMAN, MD;  Location: [facility];  Service: Cardiovascular;  Laterality: N/A;   IVC FILTER REMOVAL N/A 05/06/2017   Procedure: IVC FILTER REMOVAL;  Surgeon: Shawl Cordella KANDICE, MD;  Location: [facility];  Service: Cardiovascular;  Laterality: N/A;   JOINT REPLACEMENT Left 06/17/2016   Knees, pt stated left and right have been replaced.   LAPAROSCOPIC APPENDECTOMY N/A 10/10/2018   Procedure: APPENDECTOMY LAPAROSCOPIC ATTEMPTED CONVERTED TO OPEN;  Surgeon: Rodolph Romano, MD;  Location: [facility];  Service: General;  Laterality: N/A;   LAPAROSCOPY N/A 10/10/2018   Procedure: LAPAROSCOPY DIAGNOSTIC ATTEMPTED CONVERTED TO OPEN;  Surgeon: Rodolph Romano, MD;  Location: [facility];  Service: General;  Laterality: N/A;   LUMBAR LAMINECTOMY/DECOMPRESSION MICRODISCECTOMY N/A 07/09/2021   Procedure: Laminectomy and Foraminotomy - Lumbar Two-Lumbar Three - Lumbar Three-Lumbar Four - Lumbar Four-Lumbar Five;  Surgeon: Louis Shove, MD;  [facility];   Service: Neurosurgery;  Laterality: N/A;   neck fusion     OPEN REDUCTION INTERNAL FIXATION (ORIF) WRIST WITH ILIAC CREST BONE GRAFT Left    PARTIAL COLECTOMY Right 10/10/2018   Procedure: PARTIAL COLECTOMY;  Surgeon: Rodolph Romano, MD;  Location: [facility];  Service: General;  Laterality: Right;   PERIPHERAL VASCULAR CATHETERIZATION N/A 08/15/2015   Procedure: pulmonary angiogram with lysis;  Surgeon: Cordella KANDICE Shawl, MD;  Location: [facility] INVASIVE CV LAB;  Service: Cardiovascular;  Laterality: N/A;   WRIST SURGERY Left 2000   Past Surgical History:  Procedure Laterality Date   ANTERIOR LATERAL LUMBAR FUSION WITH PERCUTANEOUS SCREW 2 LEVEL Left 01/05/2024   Procedure: ANTERIOR LATERAL LUMBAR FUSION WITH PERCUTANEOUS SCREW LUMBAR THREE-FOUR, LUMBAR FOUR-FIVE WITH PERCUTANEOUS PEDICLE SCREWS;  Surgeon: Louis Shove, MD;  Location[facility];  Service: Neurosurgery;  Laterality: Left;   APPENDECTOMY     BREAST BIOPSY Left 08/19/2004   lt bx/clip-neg   BREAST BIOPSY Left 03/05/2023   US  BX heart clip, path pending-   BREAST BIOPSY Left 03/05/2023   US  BX hydromark coil clip axilla node- path pending   BREAST BIOPSY Left 03/05/2023   US  LT BREAST BX W LOC DEV 1ST LESION IMG BX SPEC US  GUIDE 03/05/2023 [facility]   CATARACT EXTRACTION W/PHACO Left 09/10/2022   Procedure: CATARACT EXTRACTION PHACO AND INTRAOCULAR LENS PLACEMENT (IOC) LEFT  5.88  00:31.9;  Surgeon: Jaye Fallow, MD;  Location: [facility];  Service: Ophthalmology;  Laterality: Left;  sleep apnea   COLONOSCOPY     COLONOSCOPY WITH PROPOFOL  N/A 03/10/2017   Procedure: COLONOSCOPY WITH PROPOFOL ;  Surgeon: Gaylyn Gladis PENNER, MD;  Location: [facility] ENDOSCOPY;  Service: Endoscopy;  Laterality: N/A;   DILATION AND CURETTAGE OF UTERUS     ESOPHAGOGASTRODUODENOSCOPY N/A 12/05/2023   Procedure: EGD (ESOPHAGOGASTRODUODENOSCOPY);  Surgeon: Maryruth Ole DASEN, MD;  Location: [facility];  Service: Endoscopy;   Laterality: N/A;   ESOPHAGOGASTRODUODENOSCOPY (EGD) WITH PROPOFOL  N/A 02/02/2019   Procedure: ESOPHAGOGASTRODUODENOSCOPY (EGD) WITH PROPOFOL ;  Surgeon: Toledo, Ladell POUR, MD;  Location: [facility];  Service: Endoscopy;  Laterality: N/A;   EYE SURGERY     IR EMBO ART  VEN HEMORR LYMPH EXTRAV  INC GUIDE ROADMAPPING  11/20/2023   IVC FILTER INSERTION N/A 06/11/2016   Procedure: IVC Filter Insertion;  Surgeon: Cordella KANDICE Shawl, MD;  Location: [facility];  Service: Cardiovascular;  Laterality: N/A;   IVC FILTER INSERTION N/A 11/27/2023   Procedure: IVC FILTER INSERTION;  Surgeon: Marea Selinda RAMAN, MD;  Location: [facility];  Service: Cardiovascular;  Laterality: N/A;   IVC FILTER REMOVAL N/A 05/06/2017   Procedure: IVC FILTER REMOVAL;  Surgeon: Shawl Cordella KANDICE, MD;  Location: [facility];  Service: Cardiovascular;  Laterality: N/A;   JOINT REPLACEMENT Left 06/17/2016   Knees, pt stated left and right have been replaced.   LAPAROSCOPIC APPENDECTOMY N/A 10/10/2018   Procedure: APPENDECTOMY LAPAROSCOPIC ATTEMPTED CONVERTED TO OPEN;  Surgeon: Rodolph Romano, MD;  Location: [facility];  Service: General;  Laterality: N/A;   LAPAROSCOPY N/A 10/10/2018   Procedure: LAPAROSCOPY DIAGNOSTIC ATTEMPTED CONVERTED TO OPEN;  Surgeon: Rodolph Romano, MD;  Location: [facility];  Service: General;  Laterality: N/A;   LUMBAR LAMINECTOMY/DECOMPRESSION MICRODISCECTOMY N/A 07/09/2021   Procedure: Laminectomy and Foraminotomy - Lumbar Two-Lumbar Three - Lumbar Three-Lumbar Four - Lumbar Four-Lumbar Five;  Surgeon: Louis Shove, MD;  Location[facility];  Service: Neurosurgery;  Laterality: N/A;   neck fusion     OPEN REDUCTION INTERNAL FIXATION (ORIF) WRIST WITH ILIAC CREST BONE GRAFT Left    PARTIAL COLECTOMY Right 10/10/2018   Procedure: PARTIAL COLECTOMY;  Surgeon: Rodolph Romano, MD;  Location: [facility];  Service: General;  Laterality: Right;   PERIPHERAL VASCULAR  CATHETERIZATION N/A 08/15/2015   Procedure: pulmonary angiogram with lysis;  Surgeon: Cordella KANDICE Shawl, MD;  Location: [facility] INVASIVE CV LAB;  Service: Cardiovascular;  Laterality: N/A;   WRIST SURGERY Left 2000   Past Medical History:  Diagnosis Date   Adnexal cyst    Anginal pain    atypical chest pain   Anxiety    Arthritis    Back pain    Cancer (HCC)    cll   Chronic kidney disease    CLL (chronic lymphocytic leukemia) (HCC)    DDD (degenerative disc disease), lumbar    Depression    Epigastric pain    GERD (gastroesophageal reflux disease)    Headache    Hemorrhoids    History of Bell's palsy    Hypercholesteremia    Hyperlipidemia    Hypertension    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)    Macular degeneration    MGUS (monoclonal gammopathy of unknown significance)    Morbid obesity (HCC)    Obesity    Pre-diabetes    Pulmonary embolism (HCC)    PVD (peripheral vascular disease)    RLS (restless legs syndrome)    Sleep apnea    CPAP   BP 129/86   Pulse 83   Ht 5' 4 (1.626 m)   Wt 238 lb (108 kg)   SpO2 96%   BMI 40.85 kg/m   Opioid Risk Score:   Fall Risk Score:  `1  Depression screen PHQ 2/9     07/04/2023    2:03 PM 03/31/2023    2:03 PM 09/23/2022    1:07 PM 06/24/2022    1:46 PM 03/11/2022    1:37 PM 11/16/2021   11:18 AM 11/05/2021   10:42 AM  Depression screen PHQ 2/9  Decreased Interest 3 0 0 1 0 2 0  Down, Depressed, Hopeless 3 0 0 1 0 0 0  PHQ - 2 Score 6 0  0 2 0 2 0  Altered sleeping      1   Tired, decreased energy      1   Change in appetite      0   Feeling bad or failure about yourself       0   Trouble concentrating      0   Moving slowly or fidgety/restless      0   Suicidal thoughts      0   PHQ-9 Score      4   Difficult doing work/chores      Very difficult      Review of Systems  Musculoskeletal:  Positive for back pain.  All other systems reviewed and are negative.      Objective:   Physical  Exam  Awake alert, appropriate- using Rolator to walk, NAD Hands shaking/tremors intermittently       Assessment & Plan:   Pt is a 83 yr old female with BMI of 41, CLL; hx of Bell's palsy; central lumbar stenosis and radiculopathy- hx of Lumbar lami L2-L5; HTN- but not lately. 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   Also now S/P IVC filter that's new and recent admission for Diverticulr GI bleed.   Has a Hemoglobin  of 8.3- should be 13 or so-  Was weak and Hemoglobin was already low due to GI bleed/diverticulitis bleed in July- so hadn't rebooted. Probably why shaking inside- so weak-     2. Pain meds- will con't Oxycodone  1 more Rx- has 51 left- will give 90  tabs- so can take 1 tab 3x/day- as needed- and this will give her 6 more weeks of meds- so should hopefully be able ot transition back to Hydrocodone - in 6 weeks ago.  Let us  know- can do 1 more month of Oxycodone  after that IF NEEDED!  3. Con't Gabapentin  400 mg 2 pills in Am and 1 tab in PM- #270- 1 refill.    4. Cannot get Iron per PCP- so needs beans- chew up well , dairy- red meat or seafood/chicken; and leafy green veggies!- Make self a salad- dark lettuce/spinach, collard greens, etc.  So we can build your blood back!  5. F/U with Fidela in 2 months and me in 4 months.    6. Dealing with grief- daughter died in 07/30/2023- we discussed this.    I spent a total of  34  minutes on total care today- >50% coordination of care- due to d/w pt about daughter death-

## 2024-02-23 DIAGNOSIS — D631 Anemia in chronic kidney disease: Secondary | ICD-10-CM | POA: Diagnosis not present

## 2024-02-25 DIAGNOSIS — M4316 Spondylolisthesis, lumbar region: Secondary | ICD-10-CM | POA: Diagnosis not present

## 2024-02-26 ENCOUNTER — Telehealth: Payer: Self-pay

## 2024-02-26 MED ORDER — GABAPENTIN 400 MG PO CAPS: 400.0000 mg | ORAL_CAPSULE | Freq: Four times a day (QID) | ORAL | 1 refills | Status: AC

## 2024-02-26 NOTE — Telephone Encounter (Signed)
 Summary: Take 1 capsule (400 mg total) by mouth 4 (four) times daily. Take 2 pills in Am and 1 pills at night- for nerve pain, Starting Fri 02/20/2024, Normal    [facility] need clarification on Gabapentin . It has two sets for directions.  Jamie Phillips is almost out of the Rx.   Please resend with new directions to [facility]. (It will be okay to put on the script cancel prior Rx).

## 2024-02-27 ENCOUNTER — Telehealth: Payer: Self-pay | Admitting: *Deleted

## 2024-02-27 MED ORDER — GABAPENTIN 400 MG PO CAPS: 400.0000 mg | ORAL_CAPSULE | Freq: Four times a day (QID) | ORAL | 0 refills | Status: AC

## 2024-02-27 NOTE — Telephone Encounter (Signed)
 Needs a week supply sent to local pharmacy of her gabapentin  to get through until mail order arrives.

## 2024-03-01 DIAGNOSIS — K219 Gastro-esophageal reflux disease without esophagitis: Secondary | ICD-10-CM | POA: Diagnosis not present

## 2024-03-01 DIAGNOSIS — Z4789 Encounter for other orthopedic aftercare: Secondary | ICD-10-CM | POA: Diagnosis not present

## 2024-03-01 DIAGNOSIS — G2581 Restless legs syndrome: Secondary | ICD-10-CM | POA: Diagnosis not present

## 2024-03-01 DIAGNOSIS — N183 Chronic kidney disease, stage 3 unspecified: Secondary | ICD-10-CM | POA: Diagnosis not present

## 2024-03-01 DIAGNOSIS — D631 Anemia in chronic kidney disease: Secondary | ICD-10-CM | POA: Diagnosis not present

## 2024-03-01 DIAGNOSIS — E7800 Pure hypercholesterolemia, unspecified: Secondary | ICD-10-CM | POA: Diagnosis not present

## 2024-03-01 DIAGNOSIS — M4316 Spondylolisthesis, lumbar region: Secondary | ICD-10-CM | POA: Diagnosis not present

## 2024-03-01 DIAGNOSIS — G4733 Obstructive sleep apnea (adult) (pediatric): Secondary | ICD-10-CM | POA: Diagnosis not present

## 2024-03-01 DIAGNOSIS — I129 Hypertensive chronic kidney disease with stage 1 through stage 4 chronic kidney disease, or unspecified chronic kidney disease: Secondary | ICD-10-CM | POA: Diagnosis not present

## 2024-03-02 DIAGNOSIS — I129 Hypertensive chronic kidney disease with stage 1 through stage 4 chronic kidney disease, or unspecified chronic kidney disease: Secondary | ICD-10-CM | POA: Diagnosis not present

## 2024-03-02 DIAGNOSIS — N1831 Chronic kidney disease, stage 3a: Secondary | ICD-10-CM | POA: Diagnosis not present

## 2024-03-02 DIAGNOSIS — Z23 Encounter for immunization: Secondary | ICD-10-CM | POA: Diagnosis not present

## 2024-03-02 DIAGNOSIS — M25511 Pain in right shoulder: Secondary | ICD-10-CM | POA: Diagnosis not present

## 2024-03-02 DIAGNOSIS — C9110 Chronic lymphocytic leukemia of B-cell type not having achieved remission: Secondary | ICD-10-CM | POA: Diagnosis not present

## 2024-03-02 NOTE — Progress Notes (Signed)
 Jamie Phillips is a 83 y.o. female here for follow up of their medical problems  CHIEF COMPLAINT:  Follow up medical problems in the problem list and as discussed in the history and assessment areas as well as new complaints as listed.   Patient Active Problem List  Diagnosis  . 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 OSA on CPAP  . Pure hypercholesterolemia  . History of Bell's palsy  . GERD without esophagitis  . Generalized OA  . CLL (chronic lymphocytic leukemia) (CMS/HHS-HCC)  . Depression, major, in remission ()  . Prediabetes  . Chronic pulmonary embolism (CMS/HHS-HCC)  . Healthcare maintenance  . Morbid obesity with BMI of 40.0-44.9, adult (CMS-HCC)  . RLS (restless legs syndrome)  . DDD (degenerative disc disease), lumbar  . Walker as ambulation aid  . Adnexal cyst  . Degenerative arthritis of right shoulder region     HISTORY OF PRESENT ILLNESS:  Big issue is continued fatigue and weakness. Hasn't followed up with heme. Right shoulder pain also. No cp and no cough or edema   Past Medical History:  Diagnosis Date  . Anxiety   . Atypical chest pain   . Cervical disc disease   . Chronic kidney disease   . Chronic low back pain   . Chronic pulmonary embolism (CMS/HHS-HCC) 08/25/2015   4-17, eliquis  started: filter to come out after 9-18 knee surgery  . CLL (chronic lymphocytic leukemia) (CMS/HHS-HCC) 01/23/2014  . Depression   . Depression, major, in remission () 02/06/2014  . Fibroid   . GERD without esophagitis 01/23/2014  . HA (headache)   . History of Bell's palsy 01/23/2014  . Hyperglycemia 10/13/2014  . Hyperlipidemia   . Hypertension   . Lymphocytosis    CLL versus myeloproliferative disorder  . Macular degeneration   . Morbid obesity with BMI of 45.0-49.9, adult (CMS/HHS-HCC) 02/10/2014  . Obesity   . OSA on CPAP 01/23/2014  . Osteoarthritis   . Ovarian cyst   . Peripheral vascular disease of lower extremity ()   . Pneumonia   .  Pulmonary embolism (CMS/HHS-HCC) 08/14/2015  . Sleep apnea   . Status post left knee replacement 06/21/2016  . Status post total right knee replacement using cement 01/30/2017    Past Surgical History:  Procedure Laterality Date  . COLONOSCOPY  08/28/2006   Hyperplastic colon polyp  . pulmonary angiogram with lysis  08/15/2015  . JOINT REPLACEMENT Left 06/17/2016   mako total knee  . JOINT REPLACEMENT Right 01/13/2017   MAKO Dr. [facility]  . COLONOSCOPY  03/10/2017   HP Repeat 32yrs if desired by patient MUS   . LAPAROSCOPIC APPENDECTOMY  10/10/2018   Dr Lucas Catchings   . colon surgery Right 10/10/2018   Dr. Lucas Catchings   . EGD  02/02/2019   GAVE/Diffuse esophageal spasm/Repeat 70yrs/TKT  . EGD @ [facility]  12/05/2023   EGD path unremarkable. Repeat EGD prn/CTL  . COLON SURGERY    . DILATION AND CURETTAGE, DIAGNOSTIC / THERAPEUTIC    . Open reduction internal fixation of the left wrist with pin placement       No fever chills or sweats   No nausea, vomiting or diarrhea  No chest pain, shortness of breath   Social History   Socioeconomic History  . Marital status: Widowed  . Number of children: 4  . Years of education: 12  Occupational History  . Occupation: Retired  Tobacco Use  . Smoking status: Never  . Smokeless  tobacco: Never  Vaping Use  . Vaping status: Never Used  Substance and Sexual Activity  . Alcohol  use: No  . Drug use: No  . Sexual activity: Not Currently    Partners: Male    Birth control/protection: Post-menopausal   Social Drivers of Health   Financial Resource Strain: Low Risk  (12/11/2023)   Overall Financial Resource Strain (CARDIA)   . Difficulty of Paying Living Expenses: Not hard at all  Food Insecurity: No Food Insecurity (12/11/2023)   Hunger Vital Sign   . Worried About Programme Researcher, Broadcasting/film/video in the Last Year: Never true   . Ran Out of Food in the Last Year: Never true  Transportation Needs: No Transportation Needs (12/11/2023)   PRAPARE  - Transportation   . Lack of Transportation (Medical): No   . Lack of Transportation (Non-Medical): No  Physical Activity: Unknown (03/18/2017)   Exercise Vital Sign   . Days of Exercise per Week: Patient declined   . Minutes of Exercise per Session: Patient declined  Stress: Unknown (03/18/2017)   [employer] of Occupational [employer] - Occupational Stress Questionnaire   . Feeling of Stress : Patient declined  Social Connections: Moderately Integrated (11/20/2023)   Received from [facility]   Social Connection and Isolation Panel   . In a typical week, how many times do you talk on the phone with family, friends, or neighbors?: Three times a week   . How often do you get together with friends or relatives?: Three times a week   . How often do you attend church or religious services?: 1 to 4 times per year   . Do you belong to any clubs or organizations such as church groups, unions, fraternal or athletic groups, or school groups?: No   . How often do you attend meetings of the clubs or organizations you belong to?: 1 to 4 times per year   . Are you married, widowed, divorced, separated, never married, or living with a partner?: Widowed  Housing Stability: Low Risk  (12/11/2023)   Housing Stability Vital Sign   . Unable to Pay for Housing in the Last Year: No   . Number of Times Moved in the Last Year: 0   . Homeless in the Last Year: No      Current Outpatient Medications:  .  antiox #8/om3/dha/epa/lut/zeax (PRESERVISION AREDS 2, OMEGA-3, ORAL), Take by mouth, Disp: , Rfl:  .  apixaban  (ELIQUIS ) 2.5 mg tablet, Take 1 tablet (2.5 mg total) by mouth every 12 (twelve) hours for 360 days, Disp: 180 tablet, Rfl: 3 .  calcium  carbonate 600 mg calcium  (1,500 mg) Tab tablet, Take 1 tablet by mouth 2 (two) times daily, Disp: , Rfl:  .  cholecalciferol (VITAMIN D3) 5,000 unit capsule, Take 1 capsule by mouth daily with breakfast, Disp: , Rfl:  .  cholestyramine  (QUESTRAN ) 4 gram oral powder  packet, MIX 1 PACKET IN LIQUID AND DRINK THREE TIMES DAILY WITH MEALS, Disp: 270 packet, Rfl: 10 .  cyanocobalamin  (VITAMIN B12) 1000 MCG tablet, Take 1,000 mcg by mouth once daily, Disp: , Rfl:  .  DULoxetine  (CYMBALTA ) 60 MG DR capsule, Take 1 capsule (60 mg total) by mouth once daily, Disp: 90 capsule, Rfl: 3 .  escitalopram  oxalate (LEXAPRO ) 20 MG tablet, Take 20 mg by mouth once daily, Disp: , Rfl:  .  gabapentin  (NEURONTIN ) 400 MG capsule, Take 2 capsules (800 mg total) by mouth 2 (two) times daily, Disp: 360 capsule, Rfl: 3 .  HYDROcodone -acetaminophen  (NORCO) 10-325 mg tablet, Take 1 tablet by mouth every 6 (six) hours as needed, Disp: , Rfl:  .  magnesium  gluconate (MAGONATE) 27.5 mg magne- sium (500 mg) tablet, Take 500 mg by mouth 2 (two) times daily, Disp: , Rfl:  .  methocarbamoL  (ROBAXIN ) 500 MG tablet, Take 500 mg by mouth once daily, Disp: , Rfl:  .  pantoprazole  (PROTONIX ) 40 MG DR tablet, TAKE 1 TABLET TWICE DAILY BEFORE MEALS, Disp: 180 tablet, Rfl: 1 .  pravastatin  (PRAVACHOL ) 40 MG tablet, Take 1 tablet (40 mg total) by mouth once daily, Disp: 90 tablet, Rfl: 10 .  sucralfate  (CARAFATE ) 1 gram tablet, TAKE 1 TABLET  TWO TIMES DAILY BEFORE MEALS, Disp: 180 tablet, Rfl: 1 .  TURMERIC ORAL, Take 1 capsule by mouth 2 (two) times daily, Disp: , Rfl:   Vitals:   03/02/24 1521  BP: 118/71  Pulse: 81   Body mass index is 40.68 kg/m. No acute distress Lungs; clear to ascultation Heart; Regular rate and rhythm  Abdomen; Soft and flat, normal bowel sounds Extremities; No clubbing, cyanosis or edema  Office Visit on 01/27/2024  Component Date Value Ref Range Status  . WBC (White Blood Cell Count) 01/27/2024 13.4 (H)  4.1 - 10.2 10^3/uL Final  . RBC (Red Blood Cell Count) 01/27/2024 3.47 (L)  4.04 - 5.48 10^6/uL Final  . Hemoglobin 01/27/2024 8.3 (L)  12.0 - 15.0 gm/dL Final  . Hematocrit 89/92/7974 28.9 (L)  35.0 - 47.0 % Final  . MCV (Mean Corpuscular Volume) 01/27/2024 83.3   80.0 - 100.0 fl Final  . MCH (Mean Corpuscular Hemoglobin) 01/27/2024 23.9 (L)  27.0 - 31.2 pg Final  . MCHC (Mean Corpuscular Hemoglobin * 01/27/2024 28.7 (L)  32.0 - 36.0 gm/dL Final  . Platelet Count 01/27/2024 377  150 - 450 10^3/uL Final  . RDW-CV (Red Cell Distribution Widt* 01/27/2024 18.2 (H)  11.6 - 14.8 % Final  . MPV (Mean Platelet Volume) 01/27/2024 10.8  9.4 - 12.4 fl Final  . Neutrophils 01/27/2024 7.94 (H)  1.50 - 7.80 10^3/uL Final  . Lymphocytes 01/27/2024 4.47 (H)  1.00 - 3.60 10^3/uL Final  . Monocytes 01/27/2024 0.87  0.00 - 1.50 10^3/uL Final  . Eosinophils 01/27/2024 0.04  0.00 - 0.55 10^3/uL Final  . Basophils 01/27/2024 0.05  0.00 - 0.09 10^3/uL Final  . Neutrophil % 01/27/2024 59.2  32.0 - 70.0 % Final  . Lymphocyte % 01/27/2024 33.3  10.0 - 50.0 % Final  . Monocyte % 01/27/2024 6.5  4.0 - 13.0 % Final  . Eosinophil % 01/27/2024 0.3 (L)  1.0 - 5.0 % Final  . Basophil% 01/27/2024 0.4  0.0 - 2.0 % Final  . Immature Granulocyte % 01/27/2024 0.3  <=0.7 % Final  . Immature Granulocyte Count 01/27/2024 0.04  <=0.06 10^3/L Final  . Glucose 01/27/2024 129 (H)  70 - 110 mg/dL Final  . Sodium 89/92/7974 141  136 - 145 mmol/L Final  . Potassium 01/27/2024 4.2  3.6 - 5.1 mmol/L Final  . Chloride 01/27/2024 104  97 - 109 mmol/L Final  . Carbon Dioxide (CO2) 01/27/2024 29.9  22.0 - 32.0 mmol/L Final  . Calcium  01/27/2024 9.2  8.7 - 10.3 mg/dL Final  . Urea  Nitrogen (BUN) 01/27/2024 23  7 - 25 mg/dL Final  . Creatinine 89/92/7974 1.1  0.6 - 1.1 mg/dL Final  . Glomerular Filtration Rate (eGFR) 01/27/2024 50 (L)  >60 mL/min/1.73sq m Final  . BUN/Crea Ratio 01/27/2024 20.9 (H)  6.0 -  20.0 Final  . Anion Gap w/K 01/27/2024 11.3  6.0 - 16.0 Final  Appointment on 12/29/2023  Component Date Value Ref Range Status  . WBC (White Blood Cell Count) 12/29/2023 12.3 (H)  4.1 - 10.2 10^3/uL Final  . RBC (Red Blood Cell Count) 12/29/2023 3.46 (L)  4.04 - 5.48 10^6/uL Final  . Hemoglobin  12/29/2023 9.2 (L)  12.0 - 15.0 gm/dL Final  . Hematocrit 90/91/7974 30.6 (L)  35.0 - 47.0 % Final  . MCV (Mean Corpuscular Volume) 12/29/2023 88.4  80.0 - 100.0 fl Final  . MCH (Mean Corpuscular Hemoglobin) 12/29/2023 26.6 (L)  27.0 - 31.2 pg Final  . MCHC (Mean Corpuscular Hemoglobin * 12/29/2023 30.1 (L)  32.0 - 36.0 gm/dL Final  . Platelet Count 12/29/2023 354  150 - 450 10^3/uL Final  . RDW-CV (Red Cell Distribution Widt* 12/29/2023 16.7 (H)  11.6 - 14.8 % Final  . MPV (Mean Platelet Volume) 12/29/2023 10.4  9.4 - 12.4 fl Final  . Neutrophils 12/29/2023 5.99  1.50 - 7.80 10^3/uL Final  . Lymphocytes 12/29/2023 5.31 (H)  1.00 - 3.60 10^3/uL Final  . Monocytes 12/29/2023 0.83  0.00 - 1.50 10^3/uL Final  . Eosinophils 12/29/2023 0.10  0.00 - 0.55 10^3/uL Final  . Basophils 12/29/2023 0.06  0.00 - 0.09 10^3/uL Final  . Neutrophil % 12/29/2023 48.7  32.0 - 70.0 % Final  . Lymphocyte % 12/29/2023 43.1  10.0 - 50.0 % Final  . Monocyte % 12/29/2023 6.7  4.0 - 13.0 % Final  . Eosinophil % 12/29/2023 0.8 (L)  1.0 - 5.0 % Final  . Basophil% 12/29/2023 0.5  0.0 - 2.0 % Final  . Immature Granulocyte % 12/29/2023 0.2  <=0.7 % Final  . Immature Granulocyte Count 12/29/2023 0.03  <=0.06 10^3/L Final  Office Visit on 12/16/2023  Component Date Value Ref Range Status  . WBC (White Blood Cell Count) 12/16/2023 9.6  4.1 - 10.2 10^3/uL Final  . RBC (Red Blood Cell Count) 12/16/2023 3.19 (L)  4.04 - 5.48 10^6/uL Final  . Hemoglobin 12/16/2023 8.8 (L)  12.0 - 15.0 gm/dL Final  . Hematocrit 91/73/7974 29.2 (L)  35.0 - 47.0 % Final  . MCV (Mean Corpuscular Volume) 12/16/2023 91.5  80.0 - 100.0 fl Final  . MCH (Mean Corpuscular Hemoglobin) 12/16/2023 27.6  27.0 - 31.2 pg Final  . MCHC (Mean Corpuscular Hemoglobin * 12/16/2023 30.1 (L)  32.0 - 36.0 gm/dL Final  . Platelet Count 12/16/2023 289  150 - 450 10^3/uL Final  . RDW-CV (Red Cell Distribution Widt* 12/16/2023 15.8 (H)  11.6 - 14.8 % Final  . MPV  (Mean Platelet Volume) 12/16/2023 11.1  9.4 - 12.4 fl Final  . Neutrophils 12/16/2023 3.99  1.50 - 7.80 10^3/uL Final  . Lymphocytes 12/16/2023 4.61 (H)  1.00 - 3.60 10^3/uL Final  . Monocytes 12/16/2023 0.67  0.00 - 1.50 10^3/uL Final  . Eosinophils 12/16/2023 0.26  0.00 - 0.55 10^3/uL Final  . Basophils 12/16/2023 0.07  0.00 - 0.09 10^3/uL Final  . Neutrophil % 12/16/2023 41.5  32.0 - 70.0 % Final  . Lymphocyte % 12/16/2023 47.9  10.0 - 50.0 % Final  . Monocyte % 12/16/2023 7.0  4.0 - 13.0 % Final  . Eosinophil % 12/16/2023 2.7  1.0 - 5.0 % Final  . Basophil% 12/16/2023 0.7  0.0 - 2.0 % Final  . Immature Granulocyte % 12/16/2023 0.2  <=0.7 % Final  . Immature Granulocyte Count 12/16/2023 0.02  <=0.06 10^3/L Final  .  Glucose 12/16/2023 114 (H)  70 - 110 mg/dL Final  . Sodium 91/73/7974 145  136 - 145 mmol/L Final  . Potassium 12/16/2023 4.6  3.6 - 5.1 mmol/L Final  . Chloride 12/16/2023 105  97 - 109 mmol/L Final  . Carbon Dioxide (CO2) 12/16/2023 33.0 (H)  22.0 - 32.0 mmol/L Final  . Urea  Nitrogen (BUN) 12/16/2023 16  7 - 25 mg/dL Final  . Creatinine 91/73/7974 1.0  0.6 - 1.1 mg/dL Final  . Glomerular Filtration Rate (eGFR) 12/16/2023 57 (L)  >60 mL/min/1.73sq m Final  . Calcium  12/16/2023 9.2  8.7 - 10.3 mg/dL Final  . AST  91/73/7974 16  8 - 39 U/L Final  . ALT  12/16/2023 10  5 - 38 U/L Final  . Alk Phos (alkaline Phosphatase) 12/16/2023 95  34 - 104 U/L Final  . Albumin  12/16/2023 4.0  3.5 - 4.8 g/dL Final  . Bilirubin, Total 12/16/2023 0.3  0.3 - 1.2 mg/dL Final  . Protein, Total 12/16/2023 6.0 (L)  6.1 - 7.9 g/dL Final  . A/G Ratio 91/73/7974 2.0  1.0 - 5.0 gm/dL Final  Initial consult on 12/11/2023  Component Date Value Ref Range Status  . Vent Rate (bpm) 12/11/2023 90   Final  . PR Interval (msec) 12/11/2023 148   Final  . QRS Interval (msec) 12/11/2023 90   Final  . QT Interval (msec) 12/11/2023 340   Final  . QTc (msec) 12/11/2023 415   Final  Office Visit on  12/09/2023  Component Date Value Ref Range Status  . Glucose 12/09/2023 114 (H)  70 - 110 mg/dL Final  . Sodium 91/80/7974 144  136 - 145 mmol/L Final  . Potassium 12/09/2023 4.3  3.6 - 5.1 mmol/L Final  . Chloride 12/09/2023 105  97 - 109 mmol/L Final  . Carbon Dioxide (CO2) 12/09/2023 28.0  22.0 - 32.0 mmol/L Final  . Urea  Nitrogen (BUN) 12/09/2023 18  7 - 25 mg/dL Final  . Creatinine 91/80/7974 1.0  0.6 - 1.1 mg/dL Final  . Glomerular Filtration Rate (eGFR) 12/09/2023 57 (L)  >60 mL/min/1.73sq m Final  . Calcium  12/09/2023 9.3  8.7 - 10.3 mg/dL Final  . AST  91/80/7974 11  8 - 39 U/L Final  . ALT  12/09/2023 7  5 - 38 U/L Final  . Alk Phos (alkaline Phosphatase) 12/09/2023 94  34 - 104 U/L Final  . Albumin  12/09/2023 3.9  3.5 - 4.8 g/dL Final  . Bilirubin, Total 12/09/2023 0.3  0.3 - 1.2 mg/dL Final  . Protein, Total 12/09/2023 6.0 (L)  6.1 - 7.9 g/dL Final  . A/G Ratio 91/80/7974 1.9  1.0 - 5.0 gm/dL Final  . WBC (White Blood Cell Count) 12/09/2023 13.7 (H)  4.1 - 10.2 10^3/uL Final  . RBC (Red Blood Cell Count) 12/09/2023 3.13 (L)  4.04 - 5.48 10^6/uL Final  . Hemoglobin 12/09/2023 9.0 (L)  12.0 - 15.0 gm/dL Final  . Hematocrit 91/80/7974 29.5 (L)  35.0 - 47.0 % Final  . MCV (Mean Corpuscular Volume) 12/09/2023 94.2  80.0 - 100.0 fl Final  . MCH (Mean Corpuscular Hemoglobin) 12/09/2023 28.8  27.0 - 31.2 pg Final  . MCHC (Mean Corpuscular Hemoglobin * 12/09/2023 30.5 (L)  32.0 - 36.0 gm/dL Final  . Platelet Count 12/09/2023 317  150 - 450 10^3/uL Final  . RDW-CV (Red Cell Distribution Widt* 12/09/2023 15.6 (H)  11.6 - 14.8 % Final  . MPV (Mean Platelet Volume) 12/09/2023 10.3  9.4 - 12.4 fl  Final  . Neutrophils 12/09/2023 6.55  1.50 - 7.80 10^3/uL Final  . Lymphocytes 12/09/2023 5.85 (H)  1.00 - 3.60 10^3/uL Final  . Monocytes 12/09/2023 0.94  0.00 - 1.50 10^3/uL Final  . Eosinophils 12/09/2023 0.25  0.00 - 0.55 10^3/uL Final  . Basophils 12/09/2023 0.09  0.00 - 0.09 10^3/uL Final   . Neutrophil % 12/09/2023 47.7  32.0 - 70.0 % Final  . Lymphocyte % 12/09/2023 42.6  10.0 - 50.0 % Final  . Monocyte % 12/09/2023 6.9  4.0 - 13.0 % Final  . Eosinophil % 12/09/2023 1.8  1.0 - 5.0 % Final  . Basophil% 12/09/2023 0.7  0.0 - 2.0 % Final  . Immature Granulocyte % 12/09/2023 0.3  <=0.7 % Final  . Immature Granulocyte Count 12/09/2023 0.04  <=0.06 10^3/L Final  . Thyroid  Stimulating Hormone (TSH) 12/09/2023 6.421 (H)  0.450-5.330 uIU/ml uIU/mL Final    ASSESSMENT  AND PLAN:  Diagnoses and all orders for this visit:  CLL (chronic lymphocytic leukemia) (CMS/HHS-HCC) -     CBC w/auto Differential (5 Part) -     Comprehensive Metabolic Panel (CMP) -     Thyroid  Stimulating-Hormone (TSH)  Need for influenza vaccination -     FLU VACCINE MDCK IIV3 (EGG FREE), IM PF, (33MO+)(FLUCELVAX)  Hypertensive kidney disease with stage 3a chronic kidney disease (CMS-HCC) -     CBC w/auto Differential (5 Part) -     Comprehensive Metabolic Panel (CMP) -     Thyroid  Stimulating-Hormone (TSH)     Right shoulder injected after alcohol  cleaing and lidocain/triamcinolone  given. Small ammount of bleeding, a small vein may have been hit. Discussed with her.

## 2024-03-11 DIAGNOSIS — Z6839 Body mass index (BMI) 39.0-39.9, adult: Secondary | ICD-10-CM | POA: Diagnosis not present

## 2024-03-11 DIAGNOSIS — M4310 Spondylolisthesis, site unspecified: Secondary | ICD-10-CM | POA: Diagnosis not present

## 2024-03-11 NOTE — Telephone Encounter (Signed)
 Patient states she is returning a call regarding her lab results from 11/12.  Please advise.  [phone]

## 2024-03-12 ENCOUNTER — Telehealth: Payer: Self-pay | Admitting: Internal Medicine

## 2024-03-12 ENCOUNTER — Other Ambulatory Visit: Payer: Self-pay | Admitting: *Deleted

## 2024-03-12 DIAGNOSIS — C9110 Chronic lymphocytic leukemia of B-cell type not having achieved remission: Secondary | ICD-10-CM

## 2024-03-12 NOTE — Telephone Encounter (Addendum)
 Dr. Lamount office has called to ensure that patient's most recent labs have been reviewed and he believes patient may need an urgent iron  infusion. Please advise.

## 2024-03-15 ENCOUNTER — Other Ambulatory Visit: Payer: Self-pay

## 2024-03-15 ENCOUNTER — Inpatient Hospital Stay: Attending: Internal Medicine

## 2024-03-15 DIAGNOSIS — G8929 Other chronic pain: Secondary | ICD-10-CM | POA: Insufficient documentation

## 2024-03-15 DIAGNOSIS — K5731 Diverticulosis of large intestine without perforation or abscess with bleeding: Secondary | ICD-10-CM | POA: Insufficient documentation

## 2024-03-15 DIAGNOSIS — M4316 Spondylolisthesis, lumbar region: Secondary | ICD-10-CM | POA: Insufficient documentation

## 2024-03-15 DIAGNOSIS — G2581 Restless legs syndrome: Secondary | ICD-10-CM | POA: Insufficient documentation

## 2024-03-15 DIAGNOSIS — D649 Anemia, unspecified: Secondary | ICD-10-CM | POA: Insufficient documentation

## 2024-03-15 DIAGNOSIS — E7800 Pure hypercholesterolemia, unspecified: Secondary | ICD-10-CM | POA: Insufficient documentation

## 2024-03-15 DIAGNOSIS — D472 Monoclonal gammopathy: Secondary | ICD-10-CM | POA: Insufficient documentation

## 2024-03-15 DIAGNOSIS — Z79899 Other long term (current) drug therapy: Secondary | ICD-10-CM | POA: Insufficient documentation

## 2024-03-15 DIAGNOSIS — N189 Chronic kidney disease, unspecified: Secondary | ICD-10-CM | POA: Insufficient documentation

## 2024-03-15 DIAGNOSIS — Z9049 Acquired absence of other specified parts of digestive tract: Secondary | ICD-10-CM | POA: Insufficient documentation

## 2024-03-15 DIAGNOSIS — Z86711 Personal history of pulmonary embolism: Secondary | ICD-10-CM | POA: Insufficient documentation

## 2024-03-15 DIAGNOSIS — K219 Gastro-esophageal reflux disease without esophagitis: Secondary | ICD-10-CM | POA: Insufficient documentation

## 2024-03-15 DIAGNOSIS — I10 Essential (primary) hypertension: Secondary | ICD-10-CM | POA: Insufficient documentation

## 2024-03-15 DIAGNOSIS — Z86718 Personal history of other venous thrombosis and embolism: Secondary | ICD-10-CM | POA: Insufficient documentation

## 2024-03-15 DIAGNOSIS — C9110 Chronic lymphocytic leukemia of B-cell type not having achieved remission: Secondary | ICD-10-CM

## 2024-03-15 DIAGNOSIS — E785 Hyperlipidemia, unspecified: Secondary | ICD-10-CM | POA: Insufficient documentation

## 2024-03-15 LAB — CBC WITH DIFFERENTIAL (CANCER CENTER ONLY)
Abs Immature Granulocytes: 0.03 K/uL (ref 0.00–0.07)
Basophils Absolute: 0.1 K/uL (ref 0.0–0.1)
Basophils Relative: 1 %
Eosinophils Absolute: 0.1 K/uL (ref 0.0–0.5)
Eosinophils Relative: 1 %
HCT: 30.5 % — ABNORMAL LOW (ref 36.0–46.0)
Hemoglobin: 8.7 g/dL — ABNORMAL LOW (ref 12.0–15.0)
Immature Granulocytes: 0 %
Lymphocytes Relative: 42 %
Lymphs Abs: 4.2 K/uL — ABNORMAL HIGH (ref 0.7–4.0)
MCH: 21.2 pg — ABNORMAL LOW (ref 26.0–34.0)
MCHC: 28.5 g/dL — ABNORMAL LOW (ref 30.0–36.0)
MCV: 74.2 fL — ABNORMAL LOW (ref 80.0–100.0)
Monocytes Absolute: 0.6 K/uL (ref 0.1–1.0)
Monocytes Relative: 6 %
Neutro Abs: 4.9 K/uL (ref 1.7–7.7)
Neutrophils Relative %: 50 %
Platelet Count: 336 K/uL (ref 150–400)
RBC: 4.11 MIL/uL (ref 3.87–5.11)
RDW: 19.1 % — ABNORMAL HIGH (ref 11.5–15.5)
WBC Count: 9.9 K/uL (ref 4.0–10.5)
nRBC: 0 % (ref 0.0–0.2)

## 2024-03-15 LAB — IRON AND TIBC
Iron: 21 ug/dL — ABNORMAL LOW (ref 28–170)
Saturation Ratios: 5 % — ABNORMAL LOW (ref 10.4–31.8)
TIBC: 424 ug/dL (ref 250–450)
UIBC: 403 ug/dL

## 2024-03-15 LAB — ABO/RH: ABO/RH(D): A POS

## 2024-03-15 LAB — SAMPLE TO BLOOD BANK

## 2024-03-15 LAB — FERRITIN: Ferritin: 19 ng/mL (ref 11–307)

## 2024-03-16 ENCOUNTER — Telehealth: Payer: Self-pay | Admitting: Internal Medicine

## 2024-03-16 ENCOUNTER — Encounter: Payer: Self-pay | Admitting: Nurse Practitioner

## 2024-03-16 ENCOUNTER — Inpatient Hospital Stay

## 2024-03-16 ENCOUNTER — Inpatient Hospital Stay (HOSPITAL_BASED_OUTPATIENT_CLINIC_OR_DEPARTMENT_OTHER): Admitting: Nurse Practitioner

## 2024-03-16 VITALS — BP 113/70 | HR 99 | Temp 96.9°F | Resp 16 | Ht 64.0 in | Wt 234.9 lb

## 2024-03-16 VITALS — BP 117/63 | HR 70 | Resp 16

## 2024-03-16 DIAGNOSIS — D508 Other iron deficiency anemias: Secondary | ICD-10-CM | POA: Diagnosis not present

## 2024-03-16 DIAGNOSIS — C9110 Chronic lymphocytic leukemia of B-cell type not having achieved remission: Secondary | ICD-10-CM

## 2024-03-16 DIAGNOSIS — D509 Iron deficiency anemia, unspecified: Secondary | ICD-10-CM | POA: Insufficient documentation

## 2024-03-16 MED ORDER — IRON SUCROSE 20 MG/ML IV SOLN
200.0000 mg | Freq: Once | INTRAVENOUS | Status: AC
  Administered 2024-03-16: 200 mg via INTRAVENOUS
  Filled 2024-03-16: qty 10

## 2024-03-16 NOTE — Telephone Encounter (Signed)
 Called patient to inform her of the change of appointments- (not able to add to infusions)left voicemail asking her to call back to get updated appointments.

## 2024-03-16 NOTE — Patient Instructions (Addendum)
 **Please pick up Gentle Iron  over the counter & take daily**  Iron  Sucrose Injection What is this medication? IRON  SUCROSE (EYE ern SOO krose) treats low levels of iron  (iron  deficiency anemia) in people with kidney disease. Iron  is a mineral that plays an important role in making red blood cells, which carry oxygen from your lungs to the rest of your body. This medicine may be used for other purposes; ask your health care provider or pharmacist if you have questions. COMMON BRAND NAME(S): Venofer  What should I tell my care team before I take this medication? They need to know if you have any of these conditions: Anemia not caused by low iron  levels Heart disease High levels of iron  in the blood Kidney disease Liver disease An unusual or allergic reaction to iron , other medications, foods, dyes, or preservatives Pregnant or trying to get pregnant Breastfeeding How should I use this medication? This medication is infused into a vein. It is given by your care team in a hospital or clinic setting. Talk to your care team about the use of this medication in children. While it may be prescribed for children as young as 2 years for selected conditions, precautions do apply. Overdosage: If you think you have taken too much of this medicine contact a poison control center or emergency room at once. NOTE: This medicine is only for you. Do not share this medicine with others. What if I miss a dose? Keep appointments for follow-up doses. It is important not to miss your dose. Call your care team if you are unable to keep an appointment. What may interact with this medication? Do not take this medication with any of the following: Deferoxamine Dimercaprol Other iron  products This medication may also interact with the following: Chloramphenicol Deferasirox This list may not describe all possible interactions. Give your health care provider a list of all the medicines, herbs, non-prescription drugs,  or dietary supplements you use. Also tell them if you smoke, drink alcohol , or use illegal drugs. Some items may interact with your medicine. What should I watch for while using this medication? Visit your care team for regular checks on your progress. Tell your care team if your symptoms do not start to get better or if they get worse. You may need blood work done while you are taking this medication. You may need to eat more foods that contain iron . Talk to your care team. Foods that contain iron  include whole grains or cereals, dried fruits, beans, peas, leafy green vegetables, and organ meats (liver, kidney). What side effects may I notice from receiving this medication? Side effects that you should report to your care team as soon as possible: Allergic reactions--skin rash, itching, hives, swelling of the face, lips, tongue, or throat Low blood pressure--dizziness, feeling faint or lightheaded, blurry vision Shortness of breath Side effects that usually do not require medical attention (report to your care team if they continue or are bothersome): Flushing Headache Joint pain Muscle pain Nausea Pain, redness, or irritation at injection site This list may not describe all possible side effects. Call your doctor for medical advice about side effects. You may report side effects to FDA at 1-800-FDA-1088. Where should I keep my medication? This medication is given in a hospital or clinic. It will not be stored at home. NOTE: This sheet is a summary. It may not cover all possible information. If you have questions about this medicine, talk to your doctor, pharmacist, or health care provider.  2024  Elsevier/Gold Standard (2022-11-27 00:00:00)

## 2024-03-16 NOTE — Progress Notes (Unsigned)
 C/o weakness, headache and back pain. Had back surgery 9/25.

## 2024-03-16 NOTE — Progress Notes (Unsigned)
  Cancer Center OFFICE PROGRESS NOTE  Patient Care Team: Lenon Layman ORN, MD as PCP - General (Internal Medicine) Dessa, Reyes ORN, MD (General Surgery) Meeler, Benton CROME, FNP as Referring Physician (Family Medicine) Rennie Cindy SAUNDERS, MD as Consulting Physician (Oncology); Dr.harold Maryl;    SUMMARY OF ONCOLOGIC HISTORY:  Oncology History Overview Note  # April 2017- Bil Massive Pulmonary embolus/R LE DVT- s/p Thrombolysis [Dr.Schnier, [facility]]-Eliquis ; NEG factor V leide/ Prothrombin gene. S/p IV Filter explantation.    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  2) CD56 expression on monocytes.   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  --------------------------------------------------------  DIAGNOSIS: CLL  STAGE: I        ;GOALS: COntrol  CURRENT/MOST RECENT THERAPY: surveillaince    CLL (chronic lymphocytic leukemia) (HCC)     INTERVAL HISTORY: Alone.  Ambulating with a rolling walker.   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  She had a hospitalization for a GI bleed in July.  Diverticular bleed was seen active on her CT scan in July status post embolization of small active bleed in the sigmoid colon.  At that time she required hospitalization and a unit of packed red blood cells.  Today she denies any ongoing fever chills or abdominal pain.  She does endorse intermittent diarrhea at times.   Denies any blood in stool or dark stools.  Second hospitalization was for degenerative spondylolisthesis where she underwent an uncomplicated left-sided L3-L4 and L4-L5 decompression and fusion.  This has healed but she continues to endorse chronic back pain.  Since the GI bleed and her surgery she has remained anemic.  I suspect she is iron  deficient.  We will proceed with Venofer  today.  Iron  panel and ferritin pending.  Hemoglobin 8.7 today.  She reports ongoing fatigue.  Denies any dizziness or shortness of breath.  She does continue to follow-up with neurosurgery as well as GI.   Review of Systems  Constitutional:  Positive for malaise/fatigue. Negative for chills, diaphoresis, fever and weight loss.  HENT:  Negative for nosebleeds and sore throat.   Eyes:  Negative for double vision.  Respiratory:  Negative for cough, hemoptysis, sputum production, shortness of breath and wheezing.   Cardiovascular:  Negative for chest pain, palpitations, orthopnea and leg swelling.  Gastrointestinal:  Negative for abdominal pain, blood in stool, constipation, diarrhea, heartburn, melena, nausea and vomiting.  Genitourinary:  Negative for dysuria, frequency and urgency.  Musculoskeletal:  Positive for back pain, joint pain and neck pain.  Skin: Negative.  Negative for itching and rash.  Neurological:  Positive for dizziness and headaches. Negative for tingling, focal weakness and weakness.  Endo/Heme/Allergies:  Does not bruise/bleed easily.  Psychiatric/Behavioral:  Negative for depression. The patient is not nervous/anxious and does not have insomnia.     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)    Macular degeneration    MGUS (monoclonal gammopathy of unknown significance)    Morbid obesity (HCC)    Obesity    Pre-diabetes    Pulmonary embolism (HCC)    PVD (peripheral vascular disease)    RLS (restless legs syndrome)    Sleep apnea    CPAP    PAST SURGICAL HISTORY :   Past Surgical History:  Procedure Laterality Date   ANTERIOR LATERAL LUMBAR FUSION WITH PERCUTANEOUS SCREW 2 LEVEL Left 01/05/2024   Procedure: ANTERIOR LATERAL LUMBAR FUSION WITH PERCUTANEOUS SCREW LUMBAR THREE-FOUR, LUMBAR FOUR-FIVE WITH PERCUTANEOUS PEDICLE SCREWS;  Surgeon: Louis Shove, MD;  [facility]: [facility];  Service: Neurosurgery;  Laterality: Left;   APPENDECTOMY     BREAST BIOPSY Left 08/19/2004   lt bx/clip-neg   BREAST BIOPSY Left 03/05/2023   US  BX heart clip, path pending-   BREAST BIOPSY Left 03/05/2023   US  BX hydromark coil clip axilla node- path pending   BREAST BIOPSY Left 03/05/2023   US  LT BREAST BX W LOC DEV 1ST LESION IMG BX SPEC US  GUIDE 03/05/2023 [facility]   CATARACT EXTRACTION W/PHACO Left 09/10/2022   Procedure: CATARACT EXTRACTION PHACO AND INTRAOCULAR LENS PLACEMENT (IOC) LEFT  5.88  00:31.9;  Surgeon: Jaye Fallow, MD;  Location: [facility];  Service: Ophthalmology;  Laterality: Left;  sleep apnea   COLONOSCOPY     COLONOSCOPY WITH PROPOFOL  N/A 03/10/2017   Procedure: COLONOSCOPY WITH PROPOFOL ;  Surgeon: Gaylyn Gladis PENNER, MD;  Location: [facility] ENDOSCOPY;  Service: Endoscopy;  Laterality: N/A;   DILATION AND CURETTAGE OF UTERUS     ESOPHAGOGASTRODUODENOSCOPY N/A 12/05/2023   Procedure: EGD (ESOPHAGOGASTRODUODENOSCOPY);  Surgeon: Maryruth Ole DASEN, MD;  Location: [facility] ENDOSCOPY;  Service: Endoscopy;  Laterality: N/A;   ESOPHAGOGASTRODUODENOSCOPY (EGD) WITH  PROPOFOL  N/A 02/02/2019   Procedure: ESOPHAGOGASTRODUODENOSCOPY (EGD) WITH PROPOFOL ;  Surgeon: Toledo, Ladell POUR, MD;  Location: [facility] ENDOSCOPY;  Service: Endoscopy;  Laterality: N/A;   EYE SURGERY     IR EMBO ART  VEN HEMORR LYMPH EXTRAV  INC GUIDE ROADMAPPING  11/20/2023   IVC FILTER INSERTION N/A 06/11/2016   Procedure: IVC Filter Insertion;  Surgeon: Cordella KANDICE Shawl, MD;  Location: [facility] INVASIVE CV LAB;  Service: Cardiovascular;  Laterality: N/A;   IVC FILTER INSERTION N/A 11/27/2023   Procedure: IVC FILTER INSERTION;  Surgeon: Marea Selinda RAMAN, MD;  Location: [facility] INVASIVE CV LAB;  Service: Cardiovascular;  Laterality: N/A;   IVC FILTER REMOVAL N/A 05/06/2017   Procedure: IVC FILTER REMOVAL;  Surgeon: Shawl Cordella KANDICE, MD;  Location: [facility] INVASIVE CV LAB;  Service: Cardiovascular;  Laterality: N/A;   JOINT REPLACEMENT Left 06/17/2016   Knees, pt stated left and right have been replaced.   LAPAROSCOPIC APPENDECTOMY N/A 10/10/2018   Procedure: APPENDECTOMY LAPAROSCOPIC ATTEMPTED CONVERTED TO OPEN;  Surgeon: Rodolph Romano, MD;  Location: [facility] ORS;  Service: General;  Laterality: N/A;   LAPAROSCOPY N/A 10/10/2018   Procedure: LAPAROSCOPY DIAGNOSTIC ATTEMPTED CONVERTED TO OPEN;  Surgeon: Rodolph Romano, MD;  Location: [facility] ORS;  Service: General;  Laterality: N/A;   LUMBAR LAMINECTOMY/DECOMPRESSION MICRODISCECTOMY N/A 07/09/2021   Procedure: Laminectomy and Foraminotomy - Lumbar Two-Lumbar Three - Lumbar Three-Lumbar Four - Lumbar Four-Lumbar Five;  Surgeon: Louis Shove, MD;  Location: [facility];  Service: Neurosurgery;  Laterality: N/A;   neck fusion     OPEN REDUCTION INTERNAL FIXATION (ORIF) WRIST WITH ILIAC CREST BONE GRAFT Left    PARTIAL COLECTOMY Right 10/10/2018   Procedure: PARTIAL COLECTOMY;  Surgeon: Rodolph Romano, MD;  Location: [facility] ORS;  Service: General;  Laterality: Right;   PERIPHERAL VASCULAR CATHETERIZATION N/A 08/15/2015   Procedure: pulmonary angiogram with  lysis;  Surgeon: Cordella KANDICE Shawl, MD;  [facility] INV[facility] CV [facility];  Service: Cardiovascular;  Laterality: N/A;   WRIST SURGERY Left 2000    FAMILY HISTORY :   Family History  Problem Relation Age of Onset   Pulmonary embolism Mother    Sudden death Mother    Obesity Mother    Heart failure Father    Congestive Heart Failure Father    Sudden death Father    Thyroid  cancer Brother    Breast cancer Neg Hx     SOCIAL HISTORY:   Social History   Tobacco Use   Smoking status: Never   Smokeless tobacco: Never  Vaping Use   Vaping status: Never Used  Substance Use Topics   Alcohol  use: No    Alcohol /week: 0.0 standard drinks of alcohol    Drug use: No    ALLERGIES:  has no known allergies.  MEDICATIONS:  Current Outpatient Medications  Medication Sig Dispense Refill   acetaminophen  (TYLENOL ) 650 MG CR tablet Take 650-1,300 mg by mouth every 8 (eight) hours as needed for pain. TYLENOL  ARTHRITIS     apixaban  (ELIQUIS ) 2.5 MG TABS tablet Take 2.5 mg by mouth.     b complex vitamins capsule Take 1 capsule by mouth in the morning.     calcium  carbonate (OSCAL) 1500 (600 Ca) MG TABS tablet Take 600 mg of elemental calcium  by mouth in the morning.     Cholecalciferol (VITAMIN D -3) 125 MCG (5000 UT) TABS Take 5,000 Units by mouth in the morning.     cholestyramine  (QUESTRAN ) 4 g packet Take 1 packet (4 g total) by mouth 3 (three) times daily with meals. (Patient taking differently: Take 4 g by mouth daily before supper.) 60 each 0   Cyanocobalamin  (VITAMIN B-12 PO) Take 1 tablet by mouth in the morning.     DULoxetine  (CYMBALTA ) 60 MG capsule Take 1 capsule (60 mg total) by mouth daily. With Other SSRI 90 capsule 3   escitalopram  (LEXAPRO ) 20 MG tablet Take 20 mg by mouth in the morning.     gabapentin  (NEURONTIN ) 400 MG capsule Take 1 capsule (400 mg total) by mouth 4 (four) times daily. Take 2 pills in Am and 1 pills at night- for nerve pain 360 capsule 1   gabapentin  (NEURONTIN )  400 MG capsule Take 1 capsule (400 mg total) by mouth 4 (four) times daily. Pt takes 2 tabs in AM, 1 tab in evening, and if needed takes 1 additional tab daily- so 4 tabs/day- for nerve pain 360 capsule 1   gabapentin  (NEURONTIN ) 400 MG capsule Take 1 capsule (400 mg total) by mouth 4 (four) times daily for 10 days. 40 capsule 0   MAGNESIUM  PO Take 1 tablet by mouth in the morning.     methocarbamol  (ROBAXIN ) 500 MG tablet TAKE 1 TABLET (500 MG TOTAL) EVERY  8 (EIGHT) HOURS AS NEEDED FOR MUSCLE SPASMS. (Patient taking differently: Take 500 mg by mouth See admin instructions. Take 1 tablet (500 mg) by mouth scheduled every morning, may take an additional dose (500 mg) by mouth if needed for muscle spasms.) 180 tablet 5   Multiple Vitamins-Minerals (PRESERVISION AREDS 2 PO) Take 1 tablet by mouth in the morning and at bedtime.     naphazoline-glycerin  (CLEAR EYES REDNESS) 0.012-0.25 % SOLN Place 1-2 drops into both eyes 4 (four) times daily as needed for eye irritation.     oxyCODONE  (ROXICODONE ) 5 MG immediate release tablet Take 1-2 tablets (5-10 mg total) by mouth every 6 (six) hours as needed for severe pain (pain score 7-10). acute pain after surgery- acute on chronic pain- G89.4 90 tablet 0   pantoprazole  (PROTONIX ) 40 MG tablet Take 40 mg by mouth See admin instructions. Take 1 tablet (40 mg) by mouth scheduled every morning, may take an additional dose (40 mg) by mouth if needed for acid reflux/indigestion.     pravastatin  (PRAVACHOL ) 40 MG tablet Take 40 mg by mouth in the morning.     TURMERIC CURCUMIN PO Take 1 capsule by mouth in the morning.     sucralfate  (CARAFATE ) 1 g tablet Take 1 g by mouth 3 (three) times daily before meals.     No current facility-administered medications for this visit.    PHYSICAL EXAMINATION:   BP 113/70 (BP Location: Left Arm, Patient Position: Sitting, Cuff Size: Large) Comment: pt advised bp elevated, keep check at home, contact pcp if con't to stay elevated   Pulse 99   Temp (!) 96.9 F (36.1 C) (Tympanic)   Resp 16   Ht 5' 4 (1.626 m)   Wt 234 lb 14.4 oz (106.5 kg)   SpO2 97%   BMI 40.32 kg/m   Filed Weights   03/16/24 0812  Weight: 234 lb 14.4 oz (106.5 kg)      Physical Exam Constitutional:      Comments: Obese.  Walks by herself.  HENT:     Head: Normocephalic and atraumatic.     Mouth/Throat:     Pharynx: No oropharyngeal exudate.  Eyes:     Pupils: Pupils are equal, round, and reactive to light.  Cardiovascular:     Rate and Rhythm: Normal rate and regular rhythm.  Pulmonary:     Effort: No respiratory distress.     Breath sounds: No wheezing.  Abdominal:     General: Bowel sounds are normal. There is no distension.     Palpations: Abdomen is soft. There is no mass.     Tenderness: There is no abdominal tenderness. There is no guarding or rebound.  Musculoskeletal:        General: No tenderness. Normal range of motion.     Cervical back: Normal range of motion and neck supple.  Skin:    General: Skin is warm.     Comments: Chronic bruises.  Neurological:     Mental Status: She is alert and oriented to person, place, and time.  Psychiatric:        Mood and Affect: Affect normal.      LABORATORY DATA:  I have reviewed the data as listed    Component Value Date/Time   NA 143 11/28/2023 0506   NA 142 12/26/2020 1112   NA 141 04/29/2012 1822   K 4.2 11/28/2023 0506   K 3.9 04/29/2012 1822   CL 106 11/28/2023 0506   CL 107 04/29/2012  1822   CO2 28 11/28/2023 0506   CO2 28 04/29/2012 1822   GLUCOSE 102 (H) 11/28/2023 0506   GLUCOSE 129 (H) 04/29/2012 1822   BUN 26 (H) 11/28/2023 0506   BUN 15 12/26/2020 1112   BUN 21 (H) 04/29/2012 1822   CREATININE 0.90 11/28/2023 0506   CREATININE 1.12 (H) 11/18/2023 1012   CREATININE 1.02 (H) 07/28/2014 0950   CALCIUM  8.8 (L) 11/28/2023 0506   CALCIUM  8.7 04/29/2012 1822   PROT 5.6 (L) 11/28/2023 0506   PROT 5.8 (L) 12/26/2020 1112   PROT 7.1 04/29/2012 1822    ALBUMIN  3.0 (L) 11/28/2023 0506   ALBUMIN  4.0 12/26/2020 1112   ALBUMIN  3.3 (L) 04/29/2012 1822   AST 14 (L) 11/28/2023 0506   AST 14 (L) 11/18/2023 1012   ALT 11 11/28/2023 0506   ALT 11 11/18/2023 1012   ALT 33 04/29/2012 1822   ALKPHOS 63 11/28/2023 0506   ALKPHOS 169 (H) 04/29/2012 1822   BILITOT 0.5 11/28/2023 0506   BILITOT 0.6 11/18/2023 1012   GFRNONAA >60 11/28/2023 0506   GFRNONAA 49 (L) 11/18/2023 1012   GFRNONAA 55 (L) 07/28/2014 0950   GFRAA 55 (L) 09/03/2019 0830   GFRAA >60 07/28/2014 0950    No results found for: SPEP, UPEP  Lab Results  Component Value Date   WBC 9.9 03/15/2024   NEUTROABS 4.9 03/15/2024   HGB 8.7 (L) 03/15/2024   HCT 30.5 (L) 03/15/2024   MCV 74.2 (L) 03/15/2024   PLT 336 03/15/2024      Chemistry      Component Value Date/Time   NA 143 11/28/2023 0506   NA 142 12/26/2020 1112   NA 141 04/29/2012 1822   K 4.2 11/28/2023 0506   K 3.9 04/29/2012 1822   CL 106 11/28/2023 0506   CL 107 04/29/2012 1822   CO2 28 11/28/2023 0506   CO2 28 04/29/2012 1822   BUN 26 (H) 11/28/2023 0506   BUN 15 12/26/2020 1112   BUN 21 (H) 04/29/2012 1822   CREATININE 0.90 11/28/2023 0506   CREATININE 1.12 (H) 11/18/2023 1012   CREATININE 1.02 (H) 07/28/2014 0950      Component Value Date/Time   CALCIUM  8.8 (L) 11/28/2023 0506   CALCIUM  8.7 04/29/2012 1822   ALKPHOS 63 11/28/2023 0506   ALKPHOS 169 (H) 04/29/2012 1822   AST 14 (L) 11/28/2023 0506   AST 14 (L) 11/18/2023 1012   ALT 11 11/28/2023 0506   ALT 11 11/18/2023 1012   ALT 33 04/29/2012 1822   BILITOT 0.5 11/28/2023 0506   BILITOT 0.6 11/18/2023 1012            Component Ref Range & Units (hover) 1 d ago (03/15/24) 5 yr ago (12/18/18) 5 yr ago (12/17/18) 5 yr ago (12/16/18) 5 yr ago (12/15/18)  Ferritin 19 86 CM 94 CM 85      (hover) 1 d ago 3 mo ago  Iron  21 Low  31  TIBC 424 291  Saturation Ratios 5 Low  11  UIBC 403 260     ASSESSMENT & PLAN:  CLL (chronic  lymphocytic leukemia) (HCC) # APRIL 2017- Massive bilateral Pulmonary Embolus-on indefinite anticoagulation.  Continue Eliquis - Refill Eliquis  with Dr.Anderson.  No concerns for bleeding. See below re: falls- consider decreasing dose of eliquis  next visit- no other falls    # Chronic low-grade B-cell lymphoproliferative disorder/CLL-hemoglobin platelets normal.  Platelets slightly elevated 14 absolute lymphocyte count 4-5,000.  Asymptomatic/continue surveillance- Mammogram/Left breast/ left AX  LN:    -SMALL LYMPHOCYTIC LYMPHOMA.  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  Repeat CT scan ordered for Jan will keep this scheduled  # GI hemorrhage/IDA:  Hg 8.7 today we will proceed with Venofer  today and again next week ferritin and iron  and TIBC pending I suspect she will need 5 treatments of Venofer  waiting for iron  panel to make that decision.  # Degenerative spondylolisthesis: Status post left-sided L3-L4 and L4-L5 decompression and fusion in September.  Continues to follow-up with neurology.  Defer to them.     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 JULY 2025- 0.3gm/dl; K/l=7- stable- monitor for now- repeat in Feb   # Fatigue-history of obstructive sleep apnea/continue CPAP.stable.        Follow up plan: Venofer  today and weekly x 2 weeks  F/U in 2 weeks see np/md cbc with diff + venofer  F/U in 6 weeks see np/md cbc with diff/cmp/ferritin/iron  and TIBC +/- venofer  Keep all previous appointments the same LP        Morna Husband, NP 03/17/2024 3:49 PM

## 2024-03-16 NOTE — Progress Notes (Signed)
 Per preferred product reference  venofer  preferred product not requiring auth

## 2024-03-17 ENCOUNTER — Encounter: Payer: Self-pay | Admitting: Oncology

## 2024-03-23 ENCOUNTER — Telehealth: Payer: Self-pay | Admitting: Internal Medicine

## 2024-03-23 DIAGNOSIS — R222 Localized swelling, mass and lump, trunk: Secondary | ICD-10-CM | POA: Diagnosis not present

## 2024-03-23 NOTE — Telephone Encounter (Signed)
 Pt called to verify next few appts - said there was some confusion on the appt days/times - clarified appt info w/pt and sent appt reminder via mail - [facility]

## 2024-03-24 ENCOUNTER — Inpatient Hospital Stay

## 2024-03-25 ENCOUNTER — Other Ambulatory Visit: Payer: Self-pay | Admitting: Internal Medicine

## 2024-03-25 DIAGNOSIS — R222 Localized swelling, mass and lump, trunk: Secondary | ICD-10-CM

## 2024-03-31 ENCOUNTER — Inpatient Hospital Stay: Attending: Internal Medicine

## 2024-03-31 VITALS — BP 109/78 | HR 77 | Temp 96.0°F | Resp 18

## 2024-03-31 DIAGNOSIS — G8929 Other chronic pain: Secondary | ICD-10-CM | POA: Insufficient documentation

## 2024-03-31 DIAGNOSIS — G2581 Restless legs syndrome: Secondary | ICD-10-CM | POA: Diagnosis not present

## 2024-03-31 DIAGNOSIS — G4730 Sleep apnea, unspecified: Secondary | ICD-10-CM | POA: Insufficient documentation

## 2024-03-31 DIAGNOSIS — I129 Hypertensive chronic kidney disease with stage 1 through stage 4 chronic kidney disease, or unspecified chronic kidney disease: Secondary | ICD-10-CM | POA: Diagnosis not present

## 2024-03-31 DIAGNOSIS — N189 Chronic kidney disease, unspecified: Secondary | ICD-10-CM | POA: Insufficient documentation

## 2024-03-31 DIAGNOSIS — Z808 Family history of malignant neoplasm of other organs or systems: Secondary | ICD-10-CM | POA: Insufficient documentation

## 2024-03-31 DIAGNOSIS — D508 Other iron deficiency anemias: Secondary | ICD-10-CM

## 2024-03-31 DIAGNOSIS — K5731 Diverticulosis of large intestine without perforation or abscess with bleeding: Secondary | ICD-10-CM | POA: Diagnosis not present

## 2024-03-31 DIAGNOSIS — E7800 Pure hypercholesterolemia, unspecified: Secondary | ICD-10-CM | POA: Insufficient documentation

## 2024-03-31 DIAGNOSIS — E785 Hyperlipidemia, unspecified: Secondary | ICD-10-CM | POA: Insufficient documentation

## 2024-03-31 DIAGNOSIS — D649 Anemia, unspecified: Secondary | ICD-10-CM | POA: Insufficient documentation

## 2024-03-31 DIAGNOSIS — M4316 Spondylolisthesis, lumbar region: Secondary | ICD-10-CM | POA: Diagnosis not present

## 2024-03-31 DIAGNOSIS — C9110 Chronic lymphocytic leukemia of B-cell type not having achieved remission: Secondary | ICD-10-CM | POA: Insufficient documentation

## 2024-03-31 DIAGNOSIS — K219 Gastro-esophageal reflux disease without esophagitis: Secondary | ICD-10-CM | POA: Insufficient documentation

## 2024-03-31 DIAGNOSIS — M1990 Unspecified osteoarthritis, unspecified site: Secondary | ICD-10-CM | POA: Diagnosis not present

## 2024-03-31 DIAGNOSIS — I739 Peripheral vascular disease, unspecified: Secondary | ICD-10-CM | POA: Insufficient documentation

## 2024-03-31 DIAGNOSIS — Z79899 Other long term (current) drug therapy: Secondary | ICD-10-CM | POA: Insufficient documentation

## 2024-03-31 DIAGNOSIS — Z9049 Acquired absence of other specified parts of digestive tract: Secondary | ICD-10-CM | POA: Insufficient documentation

## 2024-03-31 DIAGNOSIS — Z86718 Personal history of other venous thrombosis and embolism: Secondary | ICD-10-CM | POA: Insufficient documentation

## 2024-03-31 DIAGNOSIS — D472 Monoclonal gammopathy: Secondary | ICD-10-CM | POA: Diagnosis not present

## 2024-03-31 DIAGNOSIS — Z86711 Personal history of pulmonary embolism: Secondary | ICD-10-CM | POA: Diagnosis not present

## 2024-03-31 MED ORDER — IRON SUCROSE 20 MG/ML IV SOLN
200.0000 mg | Freq: Once | INTRAVENOUS | Status: AC
  Administered 2024-03-31: 200 mg via INTRAVENOUS
  Filled 2024-03-31: qty 10

## 2024-04-05 ENCOUNTER — Inpatient Hospital Stay

## 2024-04-05 ENCOUNTER — Inpatient Hospital Stay: Admitting: Nurse Practitioner

## 2024-04-06 ENCOUNTER — Ambulatory Visit: Admission: RE | Admit: 2024-04-06 | Discharge: 2024-04-06 | Attending: Internal Medicine | Admitting: Internal Medicine

## 2024-04-06 DIAGNOSIS — R222 Localized swelling, mass and lump, trunk: Secondary | ICD-10-CM | POA: Diagnosis present

## 2024-04-07 ENCOUNTER — Inpatient Hospital Stay

## 2024-04-07 VITALS — BP 134/94 | HR 66 | Temp 97.6°F | Resp 16

## 2024-04-07 DIAGNOSIS — D649 Anemia, unspecified: Secondary | ICD-10-CM | POA: Diagnosis not present

## 2024-04-07 DIAGNOSIS — D508 Other iron deficiency anemias: Secondary | ICD-10-CM

## 2024-04-07 MED ORDER — IRON SUCROSE 20 MG/ML IV SOLN
200.0000 mg | Freq: Once | INTRAVENOUS | Status: AC
  Administered 2024-04-07: 09:00:00 200 mg via INTRAVENOUS
  Filled 2024-04-07: qty 10

## 2024-04-07 NOTE — Patient Instructions (Signed)

## 2024-04-12 ENCOUNTER — Other Ambulatory Visit: Payer: Self-pay | Admitting: *Deleted

## 2024-04-12 DIAGNOSIS — C9110 Chronic lymphocytic leukemia of B-cell type not having achieved remission: Secondary | ICD-10-CM

## 2024-04-13 ENCOUNTER — Inpatient Hospital Stay

## 2024-04-13 ENCOUNTER — Encounter: Payer: Self-pay | Admitting: Nurse Practitioner

## 2024-04-13 ENCOUNTER — Inpatient Hospital Stay (HOSPITAL_BASED_OUTPATIENT_CLINIC_OR_DEPARTMENT_OTHER): Admitting: Nurse Practitioner

## 2024-04-13 VITALS — BP 105/70 | HR 81 | Temp 98.0°F | Resp 18 | Ht 64.0 in | Wt 238.0 lb

## 2024-04-13 VITALS — BP 133/76 | HR 82

## 2024-04-13 DIAGNOSIS — D649 Anemia, unspecified: Secondary | ICD-10-CM | POA: Diagnosis not present

## 2024-04-13 DIAGNOSIS — C9110 Chronic lymphocytic leukemia of B-cell type not having achieved remission: Secondary | ICD-10-CM

## 2024-04-13 DIAGNOSIS — D508 Other iron deficiency anemias: Secondary | ICD-10-CM

## 2024-04-13 DIAGNOSIS — D509 Iron deficiency anemia, unspecified: Secondary | ICD-10-CM

## 2024-04-13 LAB — CBC WITH DIFFERENTIAL/PLATELET
Abs Immature Granulocytes: 0.05 K/uL (ref 0.00–0.07)
Basophils Absolute: 0.1 K/uL (ref 0.0–0.1)
Basophils Relative: 1 %
Eosinophils Absolute: 0.1 K/uL (ref 0.0–0.5)
Eosinophils Relative: 1 %
HCT: 36.8 % (ref 36.0–46.0)
Hemoglobin: 10.6 g/dL — ABNORMAL LOW (ref 12.0–15.0)
Immature Granulocytes: 1 %
Lymphocytes Relative: 40 %
Lymphs Abs: 4.1 K/uL — ABNORMAL HIGH (ref 0.7–4.0)
MCH: 23.8 pg — ABNORMAL LOW (ref 26.0–34.0)
MCHC: 28.8 g/dL — ABNORMAL LOW (ref 30.0–36.0)
MCV: 82.7 fL (ref 80.0–100.0)
Monocytes Absolute: 0.6 K/uL (ref 0.1–1.0)
Monocytes Relative: 6 %
Neutro Abs: 5.3 K/uL (ref 1.7–7.7)
Neutrophils Relative %: 51 %
Platelets: 255 K/uL (ref 150–400)
RBC: 4.45 MIL/uL (ref 3.87–5.11)
RDW: 26.8 % — ABNORMAL HIGH (ref 11.5–15.5)
WBC: 10.2 K/uL (ref 4.0–10.5)
nRBC: 0 % (ref 0.0–0.2)

## 2024-04-13 MED ORDER — IRON SUCROSE 20 MG/ML IV SOLN
200.0000 mg | Freq: Once | INTRAVENOUS | Status: AC
  Administered 2024-04-13: 200 mg via INTRAVENOUS

## 2024-04-13 NOTE — Patient Instructions (Signed)
 [facility] - A DEPT OF [facility]  Discharge Instructions: Thank you for choosing [facility] to provide your oncology and hematology care.  If you have a lab appointment with the Cancer Center, please go directly to the Cancer Center and check in at the registration area.  Wear comfortable clothing and clothing appropriate for easy access to any Portacath or PICC line.   We strive to give you quality time with your provider. You may need to reschedule your appointment if you arrive late (15 or more minutes).  Arriving late affects you and other patients whose appointments are after yours.  Also, if you miss three or more appointments without notifying the office, you may be dismissed from the clinic at the provider's discretion.      For prescription refill requests, have your pharmacy contact our office and allow 72 hours for refills to be completed.    Today you received the following chemotherapy and/or immunotherapy agents Venofer.      To help prevent nausea and vomiting after your treatment, we encourage you to take your nausea medication as directed.  BELOW ARE SYMPTOMS THAT SHOULD BE REPORTED IMMEDIATELY: *FEVER GREATER THAN 100.4 F (38 C) OR HIGHER *CHILLS OR SWEATING *NAUSEA AND VOMITING THAT IS NOT CONTROLLED WITH YOUR NAUSEA MEDICATION *UNUSUAL SHORTNESS OF BREATH *UNUSUAL BRUISING OR BLEEDING *URINARY PROBLEMS (pain or burning when urinating, or frequent urination) *BOWEL PROBLEMS (unusual diarrhea, constipation, pain near the anus) TENDERNESS IN MOUTH AND THROAT WITH OR WITHOUT PRESENCE OF ULCERS (sore throat, sores in mouth, or a toothache) UNUSUAL RASH, SWELLING OR PAIN  UNUSUAL VAGINAL DISCHARGE OR ITCHING   Items with * indicate a potential emergency and should be followed up as soon as possible or go to the Emergency Department if any problems should occur.  Please show the CHEMOTHERAPY ALERT CARD or IMMUNOTHERAPY  ALERT CARD at check-in to the Emergency Department and triage nurse.  Should you have questions after your visit or need to cancel or reschedule your appointment, please contact [facility] - A DEPT OF [facility]  [phone] and follow the prompts.  Office hours are 8:00 a.m. to 4:30 p.m. Monday - Friday. Please note that voicemails left after 4:00 p.m. may not be returned until the following business day.  We are closed weekends and major holidays. You have access to a nurse at all times for urgent questions. Please call the main number to the clinic [phone] and follow the prompts.  For any non-urgent questions, you may also contact your provider using MyChart. We now offer e-Visits for anyone 39 and older to request care online for non-urgent symptoms. For details visit [email or web address].   Also download the MyChart app! Go to the app store, search "MyChart", open the app, select [place], and log in with your MyChart username and password.

## 2024-04-13 NOTE — Progress Notes (Signed)
 Patient states that since coming for her iron  infusions her stool has been watery and dark black color.

## 2024-04-16 ENCOUNTER — Other Ambulatory Visit: Payer: Self-pay

## 2024-04-16 ENCOUNTER — Encounter: Payer: Self-pay | Admitting: Emergency Medicine

## 2024-04-16 ENCOUNTER — Emergency Department

## 2024-04-16 ENCOUNTER — Emergency Department
Admission: EM | Admit: 2024-04-16 | Discharge: 2024-04-16 | Disposition: A | Discharge status: Home / Self Care | Attending: Emergency Medicine | Admitting: Emergency Medicine

## 2024-04-16 DIAGNOSIS — I10 Essential (primary) hypertension: Secondary | ICD-10-CM | POA: Insufficient documentation

## 2024-04-16 DIAGNOSIS — M25551 Pain in right hip: Secondary | ICD-10-CM | POA: Insufficient documentation

## 2024-04-16 DIAGNOSIS — Z856 Personal history of leukemia: Secondary | ICD-10-CM | POA: Diagnosis not present

## 2024-04-16 MED ORDER — LIDOCAINE 5 % EX PTCH
1.0000 | MEDICATED_PATCH | CUTANEOUS | Status: DC
  Administered 2024-04-16: 1 via TRANSDERMAL
  Filled 2024-04-16: qty 1

## 2024-04-16 MED ORDER — OXYCODONE HCL 5 MG PO TABS: 5.0000 mg | ORAL_TABLET | Freq: Once | ORAL | Status: DC

## 2024-04-16 MED ORDER — OXYCODONE HCL 5 MG PO TABS
5.0000 mg | ORAL_TABLET | Freq: Once | ORAL | Status: AC
  Administered 2024-04-16: 5 mg via ORAL
  Filled 2024-04-16: qty 1

## 2024-04-16 MED ORDER — LIDOCAINE 5 % EX PTCH: 1.0000 | MEDICATED_PATCH | CUTANEOUS | 0 refills | Status: AC

## 2024-04-16 MED ORDER — OXYCODONE HCL 5 MG PO TABS: 5.0000 mg | ORAL_TABLET | Freq: Three times a day (TID) | ORAL | 0 refills | Status: DC | PRN

## 2024-04-16 NOTE — ED Triage Notes (Signed)
 Pt arrives via EMS from home with right hip pain for a week. Pain from hip to knee, worse with standing. Pt took home oxy before EMS arrived. Ambulatory for EMS.

## 2024-04-16 NOTE — ED Notes (Signed)
 Pt's daughter driving pt home. Pt dressed in cloth scrubs due to not having any clothes.

## 2024-04-16 NOTE — ED Provider Notes (Signed)
 "  [facility] Provider Note    Event Date/Time   First MD Initiated Contact with Patient 04/16/24 1049     (approximate)   History   Hip Pain   HPI  Jamie Phillips is a 83 y.o. female   presents to the ED via EMS from home with complaint of right hip pain for 1 week.  Patient denies any injury.  She reports that she does see pain management but has not taken any medication today.  Patient has history of hypertension, chronic lymphocytic leukemia, chronic pulmonary embolus, degenerative joint disease, chronic anticoagulation, chronic pain, RLS and GERD.      Physical Exam   Triage Vital Signs: ED Triage Vitals  Encounter Vitals Group     BP 04/16/24 1012 (!) 155/85     Girls Systolic BP Percentile --      Girls Diastolic BP Percentile --      Boys Systolic BP Percentile --      Boys Diastolic BP Percentile --      Pulse Rate 04/16/24 1012 79     Resp 04/16/24 1012 18     Temp 04/16/24 1012 97.6 F (36.4 C)     Temp Source 04/16/24 1012 Oral     SpO2 04/16/24 1009 97 %     Weight 04/16/24 1010 238 lb 1.6 oz (108 kg)     Height 04/16/24 1010 5' 4 (1.626 m)     Head Circumference --      Peak Flow --      Pain Score --      Pain Loc --      Pain Education --      Exclude from Growth Chart --     Most recent vital signs: Vitals:   04/16/24 1012 04/16/24 1050  BP: (!) 155/85   Pulse: 79   Resp: 18   Temp: 97.6 F (36.4 C)   SpO2: 97% 100%     General: Awake, no distress.  Alert, able to answer questions. CV:  Good peripheral perfusion.  Heart rate and rate rhythm. Resp:  Normal effort.  Lungs are clear bilaterally. Abd:  No distention.  Other:  Generalized tenderness on palpation of the right hip.  No gross deformity.  No shortening of the extremity or rotation is noted.  Skin is intact.  No erythema or abrasions are noted.   ED Results / Procedures / Treatments   Labs (all labs ordered are listed, but only abnormal results are  displayed) Labs Reviewed - No data to display    RADIOLOGY Right hip x-ray images reviewed and interpreted by myself independent of the radiologist and was negative for fracture or dislocation.  Radiology report mentions degenerative changes.    PROCEDURES:  Critical Care performed:   Procedures   MEDICATIONS ORDERED IN ED: Medications  lidocaine  (LIDODERM ) 5 % 1 patch (1 patch Transdermal Patch Applied 04/16/24 1253)  oxyCODONE  (Oxy IR/ROXICODONE ) immediate release tablet 5 mg (5 mg Oral Given 04/16/24 1253)     IMPRESSION / MDM / ASSESSMENT AND PLAN / ED COURSE  I reviewed the triage vital signs and the nursing notes.   Differential diagnosis includes, but is not limited to, right hip pain, musculoskeletal pain, dislocation, fracture, exacerbation of her chronic pain.  83 year old female is brought to the ED by EMS with complaint of right hip pain.  No recent injury.  Patient is seen by pain management for chronic pain.  She has been taking oxycodone  5  mg with her last prescription being in October.  She reports that she has 2 tablets left.  X-rays were reassuring.  Patient was given oxycodone  while in the ED and a Lidoderm  patch.  She is to call and make an appointment with her pain management doctor.  A prescription for oxycodone  was sent to the pharmacy for 12 tablets for the holiday weekend.      Patient's presentation is most consistent with acute complicated illness / injury requiring diagnostic workup.  FINAL CLINICAL IMPRESSION(S) / ED DIAGNOSES   Final diagnoses:  Acute hip pain, right     Rx / DC Orders   ED Discharge Orders          Ordered    oxyCODONE  (OXY IR/ROXICODONE ) 5 MG immediate release tablet  Every 8 hours PRN        04/16/24 1258             Note:  This document was prepared using Dragon voice recognition software and may include unintentional dictation errors.   Saunders Shona CROME, PA-C 04/16/24 1322    Dorothyann Drivers,  MD 04/16/24 1829  "

## 2024-04-16 NOTE — ED Notes (Signed)
 Pt going to X-ray at this time.

## 2024-04-16 NOTE — Discharge Instructions (Addendum)
 Follow-up with your primary care provider if any continued problems.  Also make an appointment with your pain medication management doctor for further pain medicine.  A prescription for oxycodone  was sent to the pharmacy along with Lidoderm  patches.  This is a limited amount of pain medication to get you through the holiday weekend.  Call your pain management doctor on Monday.

## 2024-04-19 ENCOUNTER — Encounter: Payer: Self-pay | Admitting: Oncology

## 2024-04-19 NOTE — Progress Notes (Signed)
 [facility] OFFICE PROGRESS NOTE  Patient Care Team: Lenon Layman ORN, MD as PCP - General (Internal Medicine) Dessa, Reyes ORN, MD (General Surgery) Meeler, Benton CROME, FNP as Referring Physician (Family Medicine) Rennie Cindy SAUNDERS, MD as Consulting Physician (Oncology); Dr.harold Maryl;    SUMMARY OF ONCOLOGIC HISTORY:  Oncology History Overview Note  # April 2017- Bil Massive Pulmonary embolus/R LE DVT- s/p Thrombolysis [Dr.Schnier, [facility]]-Eliquis ; NEG factor V leide/ Prothrombin gene. S/p IV Filter explantation.    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  2) CD56 expression on monocytes.   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  --------------------------------------------------------  DIAGNOSIS: CLL  STAGE: I        ;GOALS: COntrol  CURRENT/MOST RECENT THERAPY: surveillaince    CLL (chronic lymphocytic leukemia) (HCC)     INTERVAL HISTORY: Alone.  Ambulating with a rolling walker.   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  She had a hospitalization for a GI bleed in July.  Diverticular bleed was seen active on her CT scan in July status post embolization of small active bleed in the sigmoid colon.  At that time she required hospitalization and a unit of packed red blood cells.  Today she denies any ongoing fever chills or abdominal pain.  She does endorse intermittent diarrhea at times.   Denies any blood in stool or dark stools.  Second hospitalization was for degenerative spondylolisthesis where she underwent an uncomplicated left-sided L3-L4 and L4-L5 decompression and fusion.  This has healed but she continues to endorse chronic back pain.  Since the GI bleed and her surgery she has remained anemic.  Ferritin and Iron  and TIBC confirmed IDA.  She has tolerated IV venofer  well and reports overall feeling better.  Hemoglobin 10.6 today.    She reports some intermittent loose stools that is very dark since starting PO Iron  overall able to tolerate po iron  ok.  Will continue to monitor this.  Hg improving.  No other symptoms of GI bleed.    Review of Systems  Constitutional:  Positive for malaise/fatigue. Negative for chills, diaphoresis, fever and weight loss.  HENT:  Negative for nosebleeds and sore throat.   Eyes:  Negative for double vision.  Respiratory:  Negative for cough, hemoptysis, sputum production, shortness of breath and wheezing.   Cardiovascular:  Negative for chest pain, palpitations, orthopnea and leg swelling.  Gastrointestinal:  Positive for diarrhea. Negative for abdominal pain, blood in stool, constipation, heartburn, melena, nausea and vomiting.  Genitourinary:  Negative for dysuria, frequency and urgency.  Musculoskeletal:  Positive for joint pain. Negative for back pain and neck pain.  Skin: Negative.  Negative for itching and rash.  Neurological:  Negative for dizziness, tingling, focal weakness, weakness and headaches.  Endo/Heme/Allergies:  Does not bruise/bleed easily.  Psychiatric/Behavioral:  Negative for depression. The patient is not nervous/anxious and does not have insomnia.     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)    Macular degeneration    MGUS (monoclonal gammopathy of unknown significance)    Morbid obesity (HCC)    Obesity    Pre-diabetes    Pulmonary embolism (HCC)    PVD (peripheral vascular disease)    RLS (restless legs syndrome)    Sleep apnea    CPAP    PAST SURGICAL HISTORY :   Past Surgical History:  Procedure Laterality Date   ANTERIOR LATERAL LUMBAR FUSION WITH PERCUTANEOUS SCREW 2 LEVEL Left 01/05/2024   Procedure: ANTERIOR LATERAL LUMBAR FUSION WITH PERCUTANEOUS SCREW LUMBAR THREE-FOUR, LUMBAR FOUR-FIVE WITH PERCUTANEOUS PEDICLE SCREWS;  Surgeon: Louis Shove, MD;  Location: [facility];  Service: Neurosurgery;  Laterality: Left;   APPENDECTOMY     BREAST BIOPSY Left 08/19/2004   lt bx/clip-neg   BREAST BIOPSY Left 03/05/2023   US  BX heart clip, path pending-   BREAST BIOPSY Left 03/05/2023   US  BX hydromark coil clip axilla node- path pending   BREAST BIOPSY Left 03/05/2023   US  LT BREAST BX W LOC DEV 1ST LESION IMG BX SPEC US  GUIDE 03/05/2023 [facility]   CATARACT EXTRACTION W/PHACO Left 09/10/2022   Procedure: CATARACT EXTRACTION PHACO AND INTRAOCULAR LENS PLACEMENT (IOC) LEFT  5.88  00:31.9;  Surgeon: Jaye Fallow, MD;  Location: [facility];  Service: Ophthalmology;  Laterality: Left;  sleep apnea   COLONOSCOPY     COLONOSCOPY WITH PROPOFOL  N/A 03/10/2017   Procedure: COLONOSCOPY WITH PROPOFOL ;  Surgeon: Gaylyn Gladis PENNER, MD;  Location: [facility] ENDOSCOPY;  Service: Endoscopy;  Laterality: N/A;   DILATION AND CURETTAGE OF UTERUS     ESOPHAGOGASTRODUODENOSCOPY N/A 12/05/2023   Procedure: EGD (ESOPHAGOGASTRODUODENOSCOPY);  Surgeon: Maryruth Ole DASEN, MD;  Location: [facility] ENDOSCOPY;   Service: Endoscopy;  Laterality: N/A;   ESOPHAGOGASTRODUODENOSCOPY (EGD) WITH PROPOFOL  N/A 02/02/2019   Procedure: ESOPHAGOGASTRODUODENOSCOPY (EGD) WITH PROPOFOL ;  Surgeon: Toledo, Ladell POUR, MD;  Location: [facility] ENDOSCOPY;  Service: Endoscopy;  Laterality: N/A;   EYE SURGERY     IR EMBO ART  VEN HEMORR LYMPH EXTRAV  INC GUIDE ROADMAPPING  11/20/2023   IVC FILTER INSERTION N/A 06/11/2016   Procedure: IVC Filter Insertion;  Surgeon: Cordella KANDICE Shawl, MD;  Location: [facility] INVASIVE CV LAB;  Service: Cardiovascular;  Laterality: N/A;   IVC FILTER INSERTION N/A 11/27/2023   Procedure: IVC FILTER INSERTION;  Surgeon: Marea Selinda RAMAN, MD;  Location: [facility] INVASIVE CV LAB;  Service: Cardiovascular;  Laterality: N/A;   IVC FILTER REMOVAL N/A 05/06/2017   Procedure: IVC FILTER REMOVAL;  Surgeon: Shawl Cordella KANDICE, MD;  Location: [facility] INVASIVE CV LAB;  Service: Cardiovascular;  Laterality: N/A;   JOINT REPLACEMENT Left 06/17/2016   Knees, pt stated left and right have been replaced.   LAPAROSCOPIC APPENDECTOMY N/A 10/10/2018   Procedure: APPENDECTOMY LAPAROSCOPIC ATTEMPTED CONVERTED TO OPEN;  Surgeon: Rodolph Romano, MD;  Location: [facility] ORS;  Service: General;  Laterality: N/A;  LAPAROSCOPY N/A 10/10/2018   Procedure: LAPAROSCOPY DIAGNOSTIC ATTEMPTED CONVERTED TO OPEN;  Surgeon: Rodolph Romano, MD;  Location: [facility] ORS;  Service: General;  Laterality: N/A;   LUMBAR LAMINECTOMY/DECOMPRESSION MICRODISCECTOMY N/A 07/09/2021   Procedure: Laminectomy and Foraminotomy - Lumbar Two-Lumbar Three - Lumbar Three-Lumbar Four - Lumbar Four-Lumbar Five;  Surgeon: Louis Shove, MD;  Location: [facility] OR;  Service: Neurosurgery;  Laterality: N/A;   neck fusion     OPEN REDUCTION INTERNAL FIXATION (ORIF) WRIST WITH ILIAC CREST BONE GRAFT Left    PARTIAL COLECTOMY Right 10/10/2018   Procedure: PARTIAL COLECTOMY;  Surgeon: Rodolph Romano, MD;  Location: [facility] ORS;  Service: General;  Laterality: Right;   PERIPHERAL  VASCULAR CATHETERIZATION N/A 08/15/2015   Procedure: pulmonary angiogram with lysis;  Surgeon: Cordella KANDICE Shawl, MD;  Location: [facility] INVASIVE CV LAB;  Service: Cardiovascular;  Laterality: N/A;   WRIST SURGERY Left 2000    FAMILY HISTORY :   Family History  Problem Relation Age of Onset   Pulmonary embolism Mother    Sudden death Mother    Obesity Mother    Heart failure Father    Congestive Heart Failure Father    Sudden death Father    Thyroid  cancer Brother    Breast cancer Neg Hx     SOCIAL HISTORY:   Social History   Tobacco Use   Smoking status: Never   Smokeless tobacco: Never  Vaping Use   Vaping status: Never Used  Substance Use Topics   Alcohol  use: No    Alcohol /week: 0.0 standard drinks of alcohol    Drug use: No    ALLERGIES:  has no known allergies.  MEDICATIONS:  Current Outpatient Medications  Medication Sig Dispense Refill   acetaminophen  (TYLENOL ) 650 MG CR tablet Take 650-1,300 mg by mouth every 8 (eight) hours as needed for pain. TYLENOL  ARTHRITIS     apixaban  (ELIQUIS ) 2.5 MG TABS tablet Take 2.5 mg by mouth.     b complex vitamins capsule Take 1 capsule by mouth in the morning.     calcium  carbonate (OSCAL) 1500 (600 Ca) MG TABS tablet Take 600 mg of elemental calcium  by mouth in the morning.     Cholecalciferol (VITAMIN D -3) 125 MCG (5000 UT) TABS Take 5,000 Units by mouth in the morning.     cholestyramine  (QUESTRAN ) 4 g packet Take 1 packet (4 g total) by mouth 3 (three) times daily with meals. (Patient taking differently: Take 4 g by mouth daily before supper.) 60 each 0   Cyanocobalamin  (VITAMIN B-12 PO) Take 1 tablet by mouth in the morning.     DULoxetine  (CYMBALTA ) 60 MG capsule Take 1 capsule (60 mg total) by mouth daily. With Other SSRI 90 capsule 3   escitalopram  (LEXAPRO ) 20 MG tablet Take 20 mg by mouth in the morning.     gabapentin  (NEURONTIN ) 400 MG capsule Take 1 capsule (400 mg total) by mouth 4 (four) times daily. Take 2 pills in  Am and 1 pills at night- for nerve pain 360 capsule 1   gabapentin  (NEURONTIN ) 400 MG capsule Take 1 capsule (400 mg total) by mouth 4 (four) times daily. Pt takes 2 tabs in AM, 1 tab in evening, and if needed takes 1 additional tab daily- so 4 tabs/day- for nerve pain 360 capsule 1   gabapentin  (NEURONTIN ) 400 MG capsule Take 1 capsule (400 mg total) by mouth 4 (four) times daily for 10 days. 40 capsule 0   MAGNESIUM  PO Take 1 tablet by mouth in  the morning.     methocarbamol  (ROBAXIN ) 500 MG tablet TAKE 1 TABLET (500 MG TOTAL) EVERY 8 (EIGHT) HOURS AS NEEDED FOR MUSCLE SPASMS. (Patient taking differently: Take 500 mg by mouth See admin instructions. Take 1 tablet (500 mg) by mouth scheduled every morning, may take an additional dose (500 mg) by mouth if needed for muscle spasms.) 180 tablet 5   Multiple Vitamins-Minerals (PRESERVISION AREDS 2 PO) Take 1 tablet by mouth in the morning and at bedtime.     naphazoline-glycerin  (CLEAR EYES REDNESS) 0.012-0.25 % SOLN Place 1-2 drops into both eyes 4 (four) times daily as needed for eye irritation.     oxyCODONE  (ROXICODONE ) 5 MG immediate release tablet Take 1-2 tablets (5-10 mg total) by mouth every 6 (six) hours as needed for severe pain (pain score 7-10). acute pain after surgery- acute on chronic pain- G89.4 90 tablet 0   pantoprazole  (PROTONIX ) 40 MG tablet Take 40 mg by mouth See admin instructions. Take 1 tablet (40 mg) by mouth scheduled every morning, may take an additional dose (40 mg) by mouth if needed for acid reflux/indigestion.     pravastatin  (PRAVACHOL ) 40 MG tablet Take 40 mg by mouth in the morning.     sucralfate  (CARAFATE ) 1 g tablet Take 1 g by mouth 3 (three) times daily before meals.     TURMERIC CURCUMIN PO Take 1 capsule by mouth in the morning.     lidocaine  (LIDODERM ) 5 % Place 1 patch onto the skin daily for 10 days. Remove & Discard patch within 12 hours or as directed by MD 10 patch 0   oxyCODONE  (OXY IR/ROXICODONE ) 5 MG  immediate release tablet Take 1 tablet (5 mg total) by mouth every 8 (eight) hours as needed. 12 tablet 0   No current facility-administered medications for this visit.    PHYSICAL EXAMINATION:   BP 105/70 (BP Location: Right Arm, Patient Position: Sitting, Cuff Size: Large)   Pulse 81   Temp 98 F (36.7 C) (Tympanic)   Resp 18   Ht 5' 4 (1.626 m)   Wt 238 lb (108 kg)   SpO2 96%   BMI 40.85 kg/m   Filed Weights   04/13/24 1320  Weight: 238 lb (108 kg)      Physical Exam Constitutional:      Comments: Obese.  Walks by herself.  HENT:     Head: Normocephalic and atraumatic.     Mouth/Throat:     Pharynx: No oropharyngeal exudate.  Eyes:     Pupils: Pupils are equal, round, and reactive to light.  Cardiovascular:     Rate and Rhythm: Normal rate and regular rhythm.  Pulmonary:     Effort: No respiratory distress.     Breath sounds: No wheezing.  Abdominal:     General: Bowel sounds are normal. There is no distension.     Palpations: Abdomen is soft. There is no mass.     Tenderness: There is no abdominal tenderness. There is no guarding or rebound.  Musculoskeletal:        General: No tenderness. Normal range of motion.     Cervical back: Normal range of motion and neck supple.  Skin:    General: Skin is warm.     Comments: Chronic bruises.  Neurological:     Mental Status: She is alert and oriented to person, place, and time.  Psychiatric:        Mood and Affect: Affect normal.      LABORATORY  DATA:  I have reviewed the data as listed    Component Value Date/Time   NA 143 11/28/2023 0506   NA 142 12/26/2020 1112   NA 141 04/29/2012 1822   K 4.2 11/28/2023 0506   K 3.9 04/29/2012 1822   CL 106 11/28/2023 0506   CL 107 04/29/2012 1822   CO2 28 11/28/2023 0506   CO2 28 04/29/2012 1822   GLUCOSE 102 (H) 11/28/2023 0506   GLUCOSE 129 (H) 04/29/2012 1822   BUN 26 (H) 11/28/2023 0506   BUN 15 12/26/2020 1112   BUN 21 (H) 04/29/2012 1822   CREATININE  0.90 11/28/2023 0506   CREATININE 1.12 (H) 11/18/2023 1012   CREATININE 1.02 (H) 07/28/2014 0950   CALCIUM  8.8 (L) 11/28/2023 0506   CALCIUM  8.7 04/29/2012 1822   PROT 5.6 (L) 11/28/2023 0506   PROT 5.8 (L) 12/26/2020 1112   PROT 7.1 04/29/2012 1822   ALBUMIN  3.0 (L) 11/28/2023 0506   ALBUMIN  4.0 12/26/2020 1112   ALBUMIN  3.3 (L) 04/29/2012 1822   AST 14 (L) 11/28/2023 0506   AST 14 (L) 11/18/2023 1012   ALT 11 11/28/2023 0506   ALT 11 11/18/2023 1012   ALT 33 04/29/2012 1822   ALKPHOS 63 11/28/2023 0506   ALKPHOS 169 (H) 04/29/2012 1822   BILITOT 0.5 11/28/2023 0506   BILITOT 0.6 11/18/2023 1012   GFRNONAA >60 11/28/2023 0506   GFRNONAA 49 (L) 11/18/2023 1012   GFRNONAA 55 (L) 07/28/2014 0950   GFRAA 55 (L) 09/03/2019 0830   GFRAA >60 07/28/2014 0950    No results found for: SPEP, UPEP  Lab Results  Component Value Date   WBC 10.2 04/13/2024   NEUTROABS 5.3 04/13/2024   HGB 10.6 (L) 04/13/2024   HCT 36.8 04/13/2024   MCV 82.7 04/13/2024   PLT 255 04/13/2024      Chemistry      Component Value Date/Time   NA 143 11/28/2023 0506   NA 142 12/26/2020 1112   NA 141 04/29/2012 1822   K 4.2 11/28/2023 0506   K 3.9 04/29/2012 1822   CL 106 11/28/2023 0506   CL 107 04/29/2012 1822   CO2 28 11/28/2023 0506   CO2 28 04/29/2012 1822   BUN 26 (H) 11/28/2023 0506   BUN 15 12/26/2020 1112   BUN 21 (H) 04/29/2012 1822   CREATININE 0.90 11/28/2023 0506   CREATININE 1.12 (H) 11/18/2023 1012   CREATININE 1.02 (H) 07/28/2014 0950      Component Value Date/Time   CALCIUM  8.8 (L) 11/28/2023 0506   CALCIUM  8.7 04/29/2012 1822   ALKPHOS 63 11/28/2023 0506   ALKPHOS 169 (H) 04/29/2012 1822   AST 14 (L) 11/28/2023 0506   AST 14 (L) 11/18/2023 1012   ALT 11 11/28/2023 0506   ALT 11 11/18/2023 1012   ALT 33 04/29/2012 1822   BILITOT 0.5 11/28/2023 0506   BILITOT 0.6 11/18/2023 1012            Component Ref Range & Units (hover) 1 d ago (03/15/24) 5 yr  ago (12/18/18) 5 yr ago (12/17/18) 5 yr ago (12/16/18) 5 yr ago (12/15/18)  Ferritin 19 86 CM 94 CM 85      (hover) 1 d ago 3 mo ago  Iron  21 Low  31  TIBC 424 291  Saturation Ratios 5 Low  11  UIBC 403 260     ASSESSMENT & PLAN:  CLL (chronic lymphocytic leukemia) (HCC) # APRIL 2017- Massive bilateral Pulmonary Embolus-on indefinite  anticoagulation.  Continue Eliquis - Refill Eliquis  with Dr.Anderson.  No concerns for bleeding. See below re: falls- consider decreasing dose of eliquis  next visit- no other falls no changes   # Chronic low-grade B-cell lymphoproliferative disorder/CLL-hemoglobin platelets normal.  Platelets slightly elevated 14 absolute lymphocyte count 4-5,000.  Asymptomatic/continue surveillance- Mammogram/Left breast/ left AX LN:    -SMALL LYMPHOCYTIC LYMPHOMA.  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  Repeat CT scan ordered for Jan will keep this scheduled  # GI hemorrhage/IDA:  Hg 10.6 today we will proceed with Venofer  today and again next week Ferritin 19 iron  21 and iron  saturation 5 in Nov.  Repeat ferritin and Iron  and TIBC next visit in 4 weeks post 5th venofer  infusion  # Degenerative spondylolisthesis: Status post left-sided L3-L4 and L4-L5 decompression and fusion in September.  Continues to follow-up with neurology.  Defer to them.     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 JULY 2025- 0.3gm/dl; K/l=7- stable- monitor for now- repeat in Feb           Follow up plan:  Proceed with venofer  today and next week F/U in 4 weeks cbc with diff, ferritin, Iron  and TIBC see np/md possible iron  LP         Morna Husband, NP 04/19/2024 2:49 PM

## 2024-04-20 ENCOUNTER — Telehealth: Payer: Self-pay | Admitting: Internal Medicine

## 2024-04-20 NOTE — Telephone Encounter (Signed)
 PCP notified Dr.B reviewed imaging report via fax.

## 2024-04-20 NOTE — Telephone Encounter (Signed)
 Lindsey at next visit -please inform patient that I reviewed the CT scan-from December 2025-done with PCP-suggestive of CLL no progression.  Continue surveillance at this time  GB

## 2024-04-21 ENCOUNTER — Ambulatory Visit
Admission: RE | Admit: 2024-04-21 | Discharge: 2024-04-21 | Disposition: A | Discharge status: Home / Self Care | Source: Ambulatory Visit | Attending: Registered Nurse | Admitting: Registered Nurse

## 2024-04-21 ENCOUNTER — Encounter: Attending: Registered Nurse | Admitting: Registered Nurse

## 2024-04-21 VITALS — BP 135/82 | HR 84 | Ht 64.0 in | Wt 238.0 lb

## 2024-04-21 DIAGNOSIS — M47817 Spondylosis without myelopathy or radiculopathy, lumbosacral region: Secondary | ICD-10-CM | POA: Insufficient documentation

## 2024-04-21 DIAGNOSIS — Z79891 Long term (current) use of opiate analgesic: Secondary | ICD-10-CM | POA: Insufficient documentation

## 2024-04-21 DIAGNOSIS — M545 Low back pain, unspecified: Secondary | ICD-10-CM | POA: Insufficient documentation

## 2024-04-21 DIAGNOSIS — Z5181 Encounter for therapeutic drug level monitoring: Secondary | ICD-10-CM | POA: Insufficient documentation

## 2024-04-21 DIAGNOSIS — G8929 Other chronic pain: Secondary | ICD-10-CM | POA: Diagnosis present

## 2024-04-21 DIAGNOSIS — Z6841 Body Mass Index (BMI) 40.0 and over, adult: Secondary | ICD-10-CM | POA: Insufficient documentation

## 2024-04-21 DIAGNOSIS — M5416 Radiculopathy, lumbar region: Secondary | ICD-10-CM | POA: Diagnosis present

## 2024-04-21 DIAGNOSIS — G894 Chronic pain syndrome: Secondary | ICD-10-CM | POA: Insufficient documentation

## 2024-04-21 MED ORDER — OXYCODONE HCL 5 MG PO TABS: 5.0000 mg | ORAL_TABLET | Freq: Four times a day (QID) | ORAL | 0 refills | Status: DC | PRN

## 2024-04-21 MED ORDER — KETOROLAC TROMETHAMINE 30 MG/ML IJ SOLN
30.0000 mg | Freq: Once | INTRAMUSCULAR | Status: AC
  Administered 2024-04-21: 30 mg via INTRAMUSCULAR

## 2024-04-21 NOTE — Progress Notes (Signed)
 "  Subjective:    Patient ID: Jamie Phillips, female    DOB: 1940-08-18, 83 y.o.   MRN: [id number]  HPI: Jamie Phillips is a 83 y.o. female who returns for follow up appointment for chronic pain and medication refill. She states her  pain is located in her lower back radiating into her right hip and right lower extremity, reports increase intensity and frequency of Lumbar Radicular pain. She will call Dr Louis office to schedule appointment.  She went to [facility] ED on 12/26 with Right hip pain, note was reviewed. Her lab work was reviewed, ALK Phos was elevated discussed with Dr Cornelio, she agreed with one time order to Tramadol , Ms. Blumberg verbalizes understanding. And in agreement.  She rates her pain 8. Her current exercise regime is walking and performing stretching exercises.  Ms. Velador Morphine  equivalent is 56.28 MME.   Oral Swab was Performed today.     Pain Inventory Average Pain 8 Pain Right Now 8 My pain is sharp  In the last 24 hours, has pain interfered with the following? General activity 9 Relation with others 0 Enjoyment of life 9 What TIME of day is your pain at its worst? daytime Sleep (in general) Poor  Pain is worse with: walking, sitting, and standing Pain improves with: medication Relief from Meds: 2  Family History  Problem Relation Age of Onset   Pulmonary embolism Mother    Sudden death Mother    Obesity Mother    Heart failure Father    Congestive Heart Failure Father    Sudden death Father    Thyroid  cancer Brother    Breast cancer Neg Hx    Social History   Socioeconomic History   Marital status: Widowed    Spouse name: Not on file   Number of children: Not on file   Years of education: Not on file   Highest education level: Not on file  Occupational History   Occupation: Retired  Tobacco Use   Smoking status: Never   Smokeless tobacco: Never  Vaping Use   Vaping status: Never Used  Substance and Sexual Activity   Alcohol  use: No     Alcohol /week: 0.0 standard drinks of alcohol    Drug use: No   Sexual activity: Not on file  Other Topics Concern   Not on file  Social History Narrative   ** Merged History Encounter **       Social Drivers of Health   Tobacco Use: Low Risk (04/16/2024)   Patient History    Smoking Tobacco Use: Never    Smokeless Tobacco Use: Never    Passive Exposure: Not on file  Financial Resource Strain: Low Risk  (12/11/2023)   Received from [facility]   Overall Financial Resource Strain (CARDIA)    Difficulty of Paying Living Expenses: Not hard at all  Food Insecurity: No Food Insecurity (03/16/2024)   Epic    Worried About Radiation Protection Practitioner of Food in the Last Year: Never true    Ran Out of Food in the Last Year: Never true  Transportation Needs: No Transportation Needs (03/16/2024)   Epic    Lack of Transportation (Medical): No    Lack of Transportation (Non-Medical): No  Physical Activity: Not on file  Stress: Not on file  Social Connections: Moderately Integrated (11/20/2023)   Social Connection and Isolation Panel    Frequency of Communication with Friends and Family: Three times a week    Frequency of Social Gatherings  with Friends and Family: Three times a week    Attends Religious Services: 1 to 4 times per year    Active Member of Clubs or Organizations: No    Attends Banker Meetings: 1 to 4 times per year    Marital Status: Widowed  Depression (PHQ2-9): Low Risk (04/07/2024)   Depression (PHQ2-9)    PHQ-2 Score: 0  Alcohol  Screen: Not on file  Housing: Low Risk (03/16/2024)   Epic    Unable to Pay for Housing in the Last Year: No    Number of Times Moved in the Last Year: 0    Homeless in the Last Year: No  Utilities: Not At Risk (03/16/2024)   Epic    Threatened with loss of utilities: No  Health Literacy: Not on file   Past Surgical History:  Procedure Laterality Date   ANTERIOR LATERAL LUMBAR FUSION WITH PERCUTANEOUS SCREW 2 LEVEL Left  01/05/2024   Procedure: ANTERIOR LATERAL LUMBAR FUSION WITH PERCUTANEOUS SCREW LUMBAR THREE-FOUR, LUMBAR FOUR-FIVE WITH PERCUTANEOUS PEDICLE SCREWS;  Surgeon: Louis Shove, MD;  Location: [facility];  Service: Neurosurgery;  Laterality: Left;   APPENDECTOMY     BREAST BIOPSY Left 08/19/2004   lt bx/clip-neg   BREAST BIOPSY Left 03/05/2023   US  BX heart clip, path pending-   BREAST BIOPSY Left 03/05/2023   US  BX hydromark coil clip axilla node- path pending   BREAST BIOPSY Left 03/05/2023   US  LT BREAST BX W LOC DEV 1ST LESION IMG BX SPEC US  GUIDE 03/05/2023 [facility]   CATARACT EXTRACTION W/PHACO Left 09/10/2022   Procedure: CATARACT EXTRACTION PHACO AND INTRAOCULAR LENS PLACEMENT (IOC) LEFT  5.88  00:31.9;  Surgeon: Jaye Fallow, MD;  Location: [facility];  Service: Ophthalmology;  Laterality: Left;  sleep apnea   COLONOSCOPY     COLONOSCOPY WITH PROPOFOL  N/A 03/10/2017   Procedure: COLONOSCOPY WITH PROPOFOL ;  Surgeon: Gaylyn Gladis PENNER, MD;  Location: [facility] ENDOSCOPY;  Service: Endoscopy;  Laterality: N/A;   DILATION AND CURETTAGE OF UTERUS     ESOPHAGOGASTRODUODENOSCOPY N/A 12/05/2023   Procedure: EGD (ESOPHAGOGASTRODUODENOSCOPY);  Surgeon: Maryruth Ole DASEN, MD;  Location: [facility] ENDOSCOPY;  Service: Endoscopy;  Laterality: N/A;   ESOPHAGOGASTRODUODENOSCOPY (EGD) WITH PROPOFOL  N/A 02/02/2019   Procedure: ESOPHAGOGASTRODUODENOSCOPY (EGD) WITH PROPOFOL ;  Surgeon: Toledo, Ladell POUR, MD;  Location: [facility] ENDOSCOPY;  Service: Endoscopy;  Laterality: N/A;   EYE SURGERY     IR EMBO ART  VEN HEMORR LYMPH EXTRAV  INC GUIDE ROADMAPPING  11/20/2023   IVC FILTER INSERTION N/A 06/11/2016   Procedure: IVC Filter Insertion;  Surgeon: Cordella KANDICE Shawl, MD;  Location: [facility] INV[facility] CV LAB;  Service: Cardiovascular;  Laterality: N/A;   IVC FILTER INSERTION N/A 11/27/2023   Procedure: IVC FILTER INSERTION;  Surgeon: Marea Selinda RAMAN, MD;  Location: [facility];  Service: Cardiovascular;   Laterality: N/A;   IVC FILTER REMOVAL N/A 05/06/2017   Procedure: IVC FILTER REMOVAL;  Surgeon: Shawl Cordella KANDICE, MD;  Location: [facility];  Service: Cardiovascular;  Laterality: N/A;   JOINT REPLACEMENT Left 06/17/2016   Knees, pt stated left and right have been replaced.   LAPAROSCOPIC APPENDECTOMY N/A 10/10/2018   Procedure: APPENDECTOMY LAPAROSCOPIC ATTEMPTED CONVERTED TO OPEN;  Surgeon: Rodolph Romano, MD;  Location: [facility];  Service: General;  Laterality: N/A;   LAPAROSCOPY N/A 10/10/2018   Procedure: LAPAROSCOPY DIAGNOSTIC ATTEMPTED CONVERTED TO OPEN;  Surgeon: Rodolph Romano, MD;  Location: [facility];  Service: General;  Laterality: N/A;  LUMBAR LAMINECTOMY/DECOMPRESSION MICRODISCECTOMY N/A 07/09/2021   Procedure: Laminectomy and Foraminotomy - Lumbar Two-Lumbar Three - Lumbar Three-Lumbar Four - Lumbar Four-Lumbar Five;  Surgeon: Louis Shove, MD;  Location: [facility];  Service: Neurosurgery;  Laterality: N/A;   neck fusion     OPEN REDUCTION INTERNAL FIXATION (ORIF) WRIST WITH ILIAC CREST BONE GRAFT Left    PARTIAL COLECTOMY Right 10/10/2018   Procedure: PARTIAL COLECTOMY;  Surgeon: Rodolph Romano, MD;  Location: [facility];  Service: General;  Laterality: Right;   PERIPHERAL VASCULAR CATHETERIZATION N/A 08/15/2015   Procedure: pulmonary angiogram with lysis;  Surgeon: Cordella KANDICE Shawl, MD;  Location: [facility] INVASIVE CV LAB;  Service: Cardiovascular;  Laterality: N/A;   WRIST SURGERY Left 2000   Past Surgical History:  Procedure Laterality Date   ANTERIOR LATERAL LUMBAR FUSION WITH PERCUTANEOUS SCREW 2 LEVEL Left 01/05/2024   Procedure: ANTERIOR LATERAL LUMBAR FUSION WITH PERCUTANEOUS SCREW LUMBAR THREE-FOUR, LUMBAR FOUR-FIVE WITH PERCUTANEOUS PEDICLE SCREWS;  Surgeon: Louis Shove, MD;  Location: [facility];  Service: Neurosurgery;  Laterality: Left;   APPENDECTOMY     BREAST BIOPSY Left 08/19/2004   lt bx/clip-neg   BREAST BIOPSY Left 03/05/2023   US  BX  heart clip, path pending-   BREAST BIOPSY Left 03/05/2023   US  BX hydromark coil clip axilla node- path pending   BREAST BIOPSY Left 03/05/2023   US  LT BREAST BX W LOC DEV 1ST LESION IMG BX SPEC US  GUIDE 03/05/2023 [facility]   CATARACT EXTRACTION W/PHACO Left 09/10/2022   Procedure: CATARACT EXTRACTION PHACO AND INTRAOCULAR LENS PLACEMENT (IOC) LEFT  5.88  00:31.9;  Surgeon: Jaye Fallow, MD;  Location: [facility];  Service: Ophthalmology;  Laterality: Left;  sleep apnea   COLONOSCOPY     COLONOSCOPY WITH PROPOFOL  N/A 03/10/2017   Procedure: COLONOSCOPY WITH PROPOFOL ;  Surgeon: Gaylyn Gladis PENNER, MD;  Location: [facility] ENDOSCOPY;  Service: Endoscopy;  Laterality: N/A;   DILATION AND CURETTAGE OF UTERUS     ESOPHAGOGASTRODUODENOSCOPY N/A 12/05/2023   Procedure: EGD (ESOPHAGOGASTRODUODENOSCOPY);  Surgeon: Maryruth Ole DASEN, MD;  Location: [facility] ENDOSCOPY;  Service: Endoscopy;  Laterality: N/A;   ESOPHAGOGASTRODUODENOSCOPY (EGD) WITH PROPOFOL  N/A 02/02/2019   Procedure: ESOPHAGOGASTRODUODENOSCOPY (EGD) WITH PROPOFOL ;  Surgeon: Toledo, Ladell POUR, MD;  Location: [facility]OS[facility];  Service: Endoscopy;  Laterality: N/A;   EYE SURGERY     IR EMBO ART  VEN HEMORR LYMPH EXTRAV  INC GUIDE ROADMAPPING  11/20/2023   IVC FILTER INSERTION N/A 06/11/2016   Procedure: IVC Filter Insertion;  Surgeon: Cordella KANDICE Shawl, MD;  Location: [facility];  Service: Cardiovascular;  Laterality: N/A;   IVC FILTER INSERTION N/A 11/27/2023   Procedure: IVC FILTER INSERTION;  Surgeon: Marea Selinda RAMAN, MD;  Location: [facility];  Service: Cardiovascular;  Laterality: N/A;   IVC FILTER REMOVAL N/A 05/06/2017   Procedure: IVC FILTER REMOVAL;  Surgeon: Shawl Cordella KANDICE, MD;  Location: [facility];  Service: Cardiovascular;  Laterality: N/A;   JOINT REPLACEMENT Left 06/17/2016   Knees, pt stated left and right have been replaced.   LAPAROSCOPIC APPENDECTOMY N/A 10/10/2018   Procedure:  APPENDECTOMY LAPAROSCOPIC ATTEMPTED CONVERTED TO OPEN;  Surgeon: Rodolph Romano, MD;  Location: [facility];  Service: General;  Laterality: N/A;   LAPAROSCOPY N/A 10/10/2018   Procedure: LAPAROSCOPY DIAGNOSTIC ATTEMPTED CONVERTED TO OPEN;  Surgeon: Rodolph Romano, MD;  Location: [facility];  Service: General;  Laterality: N/A;   LUMBAR LAMINECTOMY/DECOMPRESSION MICRODISCECTOMY N/A 07/09/2021   Procedure: Laminectomy and Foraminotomy - Lumbar Two-Lumbar Three -  Lumbar Three-Lumbar Four - Lumbar Four-Lumbar Five;  Surgeon: Louis Shove, MD;  Location: [facility] OR;  Service: Neurosurgery;  Laterality: N/A;   neck fusion     OPEN REDUCTION INTERNAL FIXATION (ORIF) WRIST WITH ILIAC CREST BONE GRAFT Left    PARTIAL COLECTOMY Right 10/10/2018   Procedure: PARTIAL COLECTOMY;  Surgeon: Rodolph Romano, MD;  Location: [facility];  Service: General;  Laterality: Right;   PERIPHERAL VASCULAR CATHETERIZATION N/A 08/15/2015   Procedure: pulmonary angiogram with lysis;  Surgeon: Cordella KANDICE Shawl, MD;  Location: [facility] INVASIVE CV LAB;  Service: Cardiovascular;  Laterality: N/A;   WRIST SURGERY Left 2000   Past Medical History:  Diagnosis Date   Adnexal cyst    Anginal pain    atypical chest pain   Anxiety    Arthritis    Back pain    Cancer (HCC)    cll   Chronic kidney disease    CLL (chronic lymphocytic leukemia) (HCC)    DDD (degenerative disc disease), lumbar    Depression    Epigastric pain    GERD (gastroesophageal reflux disease)    Headache    Hemorrhoids    History of Bell's palsy    Hypercholesteremia    Hyperlipidemia    Hypertension    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  Opioid Risk Score:   Fall Risk Score:  `1  Depression screen [facility] 2/9     04/07/2024    8:51 AM 03/16/2024    8:33 AM 07/04/2023    2:03 PM 03/31/2023    2:03 PM 09/23/2022    1:07 PM 06/24/2022    1:46 PM 03/11/2022    1:37 PM  Depression screen PHQ 2/9  Decreased Interest 0 0 3 0 0 1 0  Down, Depressed, Hopeless 0 0 3 0 0 1 0  PHQ - 2 Score 0 0 6 0 0 2 0    Review of Systems  Musculoskeletal:  Positive for gait problem.       Pain in the back of upper left leg  All other systems reviewed and are negative.      Objective:   Physical Exam Vitals and nursing note reviewed.  Constitutional:      Appearance: Normal appearance.  Cardiovascular:     Rate and Rhythm: Normal rate and regular rhythm.     Pulses: Normal pulses.     Heart sounds: Normal heart sounds.  Pulmonary:     Effort: Pulmonary effort is normal.     Breath sounds: Normal breath sounds.  Musculoskeletal:     Cervical back: Normal range of motion and neck supple.     Comments: Normal Muscle Bulk and Muscle Testing Reveals:  Upper Extremities: Right: Decreased ROM 45 Degrees  and Muscle Strength 5/5 Right AC Joint Tenderness Left: Upper Extremity: Full ROM and Muscle Strength 5/5 Lumbar Paraspinal Tenderness: L-3-L-5  Lower Extremities: Full ROM and Muscle Strength 5/5 Arises from Table slowly  Antalgic  Gait      Skin:    General: Skin is warm and dry.  Neurological:     Mental Status:  She is alert and oriented to person, place, and time.  Psychiatric:        Mood and Affect: Mood normal.        Behavior: Behavior normal.          Assessment & Plan:  Acute Exacerbation of Chronic Low Back Pain: RX: Lumbar X-ray, She will call Dr Louis office to schedule an appointment, RX: Toradol  30 mg IM x 1.  2 Right Lumbar Radiculitis/ Chronic Low Back Pain w/ sciatica: Lumbar Spinal Stenosis: Ms. Florance will call Dr Louis office to schedule an appointment. Continue HEP as Tolerated. Continue to  monitor. 04/21/2024 3, Chronic Right Shoulder Pain:No complaints today. Continue current medication regimen. Continue to Monitor. 04/21/2024 4. Neuropathic Pain Bilateral Feet: Continue current medication regimen. Continue to monitor. 04/21/2024 5. Chronic Pain Syndrome: Refilled: Oxycodone  5 mg one- two  tablets every 6 hours   as needed or pain #90. We will continue the opioid monitoring program, this consists of regular clinic visits, examinations, urine drug screen, pill counts as well as use of [place]  Controlled Substance Reporting system. A 12 month History has been reviewed on the   Controlled Substance Reporting System  on 12/ 31//2025   F/U in 2 months     "

## 2024-04-23 ENCOUNTER — Telehealth: Payer: Self-pay | Admitting: Internal Medicine

## 2024-04-23 ENCOUNTER — Encounter: Payer: Self-pay | Admitting: Oncology

## 2024-04-23 NOTE — Telephone Encounter (Signed)
 Patient called to say Dr. Lenon told her she needs to be seen asap due to results of CT scan. She is currently scheduled to be seen 1/20.   Please advise if she needs to be seen sooner.

## 2024-04-24 ENCOUNTER — Encounter: Payer: Self-pay | Admitting: Oncology

## 2024-04-25 ENCOUNTER — Encounter: Payer: Self-pay | Admitting: Registered Nurse

## 2024-04-25 LAB — DRUG TOX MONITOR 1 W/CONF, ORAL FLD
Amphetamines: NEGATIVE ng/mL
Barbiturates: NEGATIVE ng/mL
Benzodiazepines: NEGATIVE ng/mL
Buprenorphine: NEGATIVE ng/mL
Cocaine: NEGATIVE ng/mL
Codeine: NEGATIVE ng/mL
Dihydrocodeine: NEGATIVE ng/mL
Fentanyl: NEGATIVE ng/mL
Heroin Metabolite: NEGATIVE ng/mL
Hydrocodone: NEGATIVE ng/mL
Hydromorphone: NEGATIVE ng/mL
MARIJUANA: NEGATIVE ng/mL
MDMA: NEGATIVE ng/mL
Meprobamate: NEGATIVE ng/mL
Methadone: NEGATIVE ng/mL
Morphine: NEGATIVE ng/mL
Nicotine Metabolite: NEGATIVE ng/mL
Norhydrocodone: NEGATIVE ng/mL
Noroxycodone: 29.5 ng/mL — ABNORMAL HIGH
Opiates: POSITIVE ng/mL — AB
Oxycodone: 74.3 ng/mL — ABNORMAL HIGH
Oxymorphone: NEGATIVE ng/mL
Phencyclidine: NEGATIVE ng/mL
Tapentadol: NEGATIVE ng/mL
Tramadol: NEGATIVE ng/mL
Zolpidem: NEGATIVE ng/mL

## 2024-04-25 LAB — DRUG TOX ALC METAB W/CON, ORAL FLD: Alcohol Metabolite: NEGATIVE ng/mL

## 2024-04-26 ENCOUNTER — Inpatient Hospital Stay: Attending: Internal Medicine

## 2024-04-26 VITALS — BP 142/89 | HR 68 | Temp 96.9°F | Resp 19

## 2024-04-26 DIAGNOSIS — Z808 Family history of malignant neoplasm of other organs or systems: Secondary | ICD-10-CM | POA: Insufficient documentation

## 2024-04-26 DIAGNOSIS — G2581 Restless legs syndrome: Secondary | ICD-10-CM | POA: Diagnosis not present

## 2024-04-26 DIAGNOSIS — E785 Hyperlipidemia, unspecified: Secondary | ICD-10-CM | POA: Insufficient documentation

## 2024-04-26 DIAGNOSIS — M129 Arthropathy, unspecified: Secondary | ICD-10-CM | POA: Insufficient documentation

## 2024-04-26 DIAGNOSIS — C9110 Chronic lymphocytic leukemia of B-cell type not having achieved remission: Secondary | ICD-10-CM | POA: Insufficient documentation

## 2024-04-26 DIAGNOSIS — D509 Iron deficiency anemia, unspecified: Secondary | ICD-10-CM | POA: Diagnosis not present

## 2024-04-26 DIAGNOSIS — I2699 Other pulmonary embolism without acute cor pulmonale: Secondary | ICD-10-CM | POA: Insufficient documentation

## 2024-04-26 DIAGNOSIS — N189 Chronic kidney disease, unspecified: Secondary | ICD-10-CM | POA: Diagnosis not present

## 2024-04-26 DIAGNOSIS — E669 Obesity, unspecified: Secondary | ICD-10-CM | POA: Insufficient documentation

## 2024-04-26 DIAGNOSIS — I739 Peripheral vascular disease, unspecified: Secondary | ICD-10-CM | POA: Insufficient documentation

## 2024-04-26 DIAGNOSIS — Z7901 Long term (current) use of anticoagulants: Secondary | ICD-10-CM | POA: Diagnosis not present

## 2024-04-26 DIAGNOSIS — Z86718 Personal history of other venous thrombosis and embolism: Secondary | ICD-10-CM | POA: Diagnosis present

## 2024-04-26 DIAGNOSIS — Z86711 Personal history of pulmonary embolism: Secondary | ICD-10-CM | POA: Insufficient documentation

## 2024-04-26 DIAGNOSIS — D472 Monoclonal gammopathy: Secondary | ICD-10-CM | POA: Diagnosis not present

## 2024-04-26 DIAGNOSIS — G4730 Sleep apnea, unspecified: Secondary | ICD-10-CM | POA: Diagnosis not present

## 2024-04-26 DIAGNOSIS — G8929 Other chronic pain: Secondary | ICD-10-CM | POA: Diagnosis not present

## 2024-04-26 DIAGNOSIS — D649 Anemia, unspecified: Secondary | ICD-10-CM | POA: Diagnosis not present

## 2024-04-26 DIAGNOSIS — M4316 Spondylolisthesis, lumbar region: Secondary | ICD-10-CM | POA: Insufficient documentation

## 2024-04-26 DIAGNOSIS — D508 Other iron deficiency anemias: Secondary | ICD-10-CM

## 2024-04-26 DIAGNOSIS — Z79899 Other long term (current) drug therapy: Secondary | ICD-10-CM | POA: Diagnosis not present

## 2024-04-26 DIAGNOSIS — Z9049 Acquired absence of other specified parts of digestive tract: Secondary | ICD-10-CM | POA: Insufficient documentation

## 2024-04-26 DIAGNOSIS — E7800 Pure hypercholesterolemia, unspecified: Secondary | ICD-10-CM | POA: Diagnosis not present

## 2024-04-26 DIAGNOSIS — I129 Hypertensive chronic kidney disease with stage 1 through stage 4 chronic kidney disease, or unspecified chronic kidney disease: Secondary | ICD-10-CM | POA: Insufficient documentation

## 2024-04-26 MED ORDER — IRON SUCROSE 20 MG/ML IV SOLN
200.0000 mg | Freq: Once | INTRAVENOUS | Status: AC
  Administered 2024-04-26: 200 mg via INTRAVENOUS
  Filled 2024-04-26: qty 10

## 2024-04-27 ENCOUNTER — Inpatient Hospital Stay

## 2024-04-27 ENCOUNTER — Inpatient Hospital Stay: Admitting: Internal Medicine

## 2024-04-29 ENCOUNTER — Other Ambulatory Visit (HOSPITAL_COMMUNITY): Payer: Self-pay

## 2024-05-04 ENCOUNTER — Telehealth: Payer: Self-pay | Admitting: Registered Nurse

## 2024-05-04 NOTE — Telephone Encounter (Signed)
 Call placed to Ms. Orlando regarding X-ray, no answer. Left message to return the call.

## 2024-05-05 ENCOUNTER — Telehealth: Payer: Self-pay | Admitting: Registered Nurse

## 2024-05-05 NOTE — Telephone Encounter (Signed)
 Patient called you back!

## 2024-05-05 NOTE — Telephone Encounter (Signed)
 Return Ms. Orlando call no answer

## 2024-05-06 ENCOUNTER — Encounter: Payer: Self-pay | Attending: Registered Nurse | Admitting: Physical Medicine & Rehabilitation

## 2024-05-06 ENCOUNTER — Encounter: Payer: Self-pay | Admitting: Physical Medicine & Rehabilitation

## 2024-05-06 VITALS — BP 120/77 | HR 100 | Ht 64.0 in | Wt 237.0 lb

## 2024-05-06 DIAGNOSIS — M549 Dorsalgia, unspecified: Secondary | ICD-10-CM | POA: Insufficient documentation

## 2024-05-06 DIAGNOSIS — G8929 Other chronic pain: Secondary | ICD-10-CM | POA: Insufficient documentation

## 2024-05-06 MED ORDER — KETOROLAC TROMETHAMINE 30 MG/ML IJ SOLN
30.0000 mg | Freq: Once | INTRAMUSCULAR | Status: AC
  Administered 2024-05-06: 30 mg via INTRAVENOUS

## 2024-05-06 NOTE — Progress Notes (Signed)
 "  Subjective:    Patient ID: Jamie Phillips, female    DOB: 25-Sep-1940, 84 y.o.   MRN: [id number]  HPI Discussed the use of AI scribe software for clinical note transcription with the patient, who gave verbal consent to proceed.  History of Present Illness MAZEY Phillips is an 84 year old female, status post lumbar spinal fusion, who presents with new onset right buttock pain radiating into the posterior thigh.  She reports constant pain localized to the right buttock with intermittent radiation into the posterior thigh, not extending past the knee. The pain began approximately two and a half weeks ago, without preceding trauma, falls, or changes in activity. She describes the sensation as a muscle spasm and notes that certain sitting positions exacerbate the pain, occasionally causing dyspnea and requiring her to shift position. She denies similar pain prior to or immediately following her recent lumbar surgery and has not experienced previous episodes of this type of pain.  She underwent lumbar spinal fusion at L3-4 and L4-5 on December 28, 2023. Postoperative imaging demonstrated anterolisthesis at L3-4 and L4-5, interbody spacers, disc narrowing at L1-2, and no fractures, with hardware in appropriate position.  She has longstanding scoliosis and significant sacroiliac joint arthritis, previously discussed with her spine surgeon. The current pain is distinct from her prior lumbar symptoms, which were located higher in the back.  She also experiences chronic right shoulder pain described as bone on bone, with crepitus on movement. She declined surgical intervention and receives periodic cortisone injections every three to four months. She takes calcium  and vitamin D  supplementation for bone health and has a prescription topical medication for her back, which she is unable to apply herself due to limited reach.   Pain Inventory Average Pain 9 Pain Right Now 9 My pain is sharp  In the  last 24 hours, has pain interfered with the following? General activity 7 Relation with others 0 Enjoyment of life 10 What TIME of day is your pain at its worst? daytime Sleep (in general) Poor  Pain is worse with: unsure Pain improves with: heat/ice Relief from Meds: 5  Family History  Problem Relation Age of Onset   Pulmonary embolism Mother    Sudden death Mother    Obesity Mother    Heart failure Father    Congestive Heart Failure Father    Sudden death Father    Thyroid  cancer Brother    Breast cancer Neg Hx    Social History   Socioeconomic History   Marital status: Widowed    Spouse name: Not on file   Number of children: Not on file   Years of education: Not on file   Highest education level: Not on file  Occupational History   Occupation: Retired  Tobacco Use   Smoking status: Never   Smokeless tobacco: Never  Vaping Use   Vaping status: Never Used  Substance and Sexual Activity   Alcohol  use: No    Alcohol /week: 0.0 standard drinks of alcohol    Drug use: No   Sexual activity: Not on file  Other Topics Concern   Not on file  Social History Narrative   ** Merged History Encounter **       Social Drivers of Health   Tobacco Use: Low Risk (04/25/2024)   Patient History    Smoking Tobacco Use: Never    Smokeless Tobacco Use: Never    Passive Exposure: Not on file  Financial Resource Strain: Low Risk  (12/11/2023)  Received from [facility]   Overall Financial Resource Strain (CARDIA)    Difficulty of Paying Living Expenses: Not hard at all  Food Insecurity: No Food Insecurity (03/16/2024)   Epic    Worried About Running Out of Food in the Last Year: Never true    Ran Out of Food in the Last Year: Never true  Transportation Needs: No Transportation Needs (03/16/2024)   Epic    Lack of Transportation (Medical): No    Lack of Transportation (Non-Medical): No  Physical Activity: Not on file  Stress: Not on file  Social Connections:  Moderately Integrated (11/20/2023)   Social Connection and Isolation Panel    Frequency of Communication with Friends and Family: Three times a week    Frequency of Social Gatherings with Friends and Family: Three times a week    Attends Religious Services: 1 to 4 times per year    Active Member of Clubs or Organizations: No    Attends Banker Meetings: 1 to 4 times per year    Marital Status: Widowed  Depression (PHQ2-9): Low Risk (04/26/2024)   Depression (PHQ2-9)    PHQ-2 Score: 0  Alcohol  Screen: Not on file  Housing: Low Risk (03/16/2024)   Epic    Unable to Pay for Housing in the Last Year: No    Number of Times Moved in the Last Year: 0    Homeless in the Last Year: No  Utilities: Not At Risk (03/16/2024)   Epic    Threatened with loss of utilities: No  Health Literacy: Not on file   Past Surgical History:  Procedure Laterality Date   ANTERIOR LATERAL LUMBAR FUSION WITH PERCUTANEOUS SCREW 2 LEVEL Left 01/05/2024   Procedure: ANTERIOR LATERAL LUMBAR FUSION WITH PERCUTANEOUS SCREW LUMBAR THREE-FOUR, LUMBAR FOUR-FIVE WITH PERCUTANEOUS PEDICLE SCREWS;  Surgeon: Louis Shove, MD;  Location: [facility];  Service: Neurosurgery;  Laterality: Left;   APPENDECTOMY     BREAST BIOPSY Left 08/19/2004   lt bx/clip-neg   BREAST BIOPSY Left 03/05/2023   US  BX heart clip, path pending-   BREAST BIOPSY Left 03/05/2023   US  BX hydromark coil clip axilla node- path pending   BREAST BIOPSY Left 03/05/2023   US  LT BREAST BX W LOC DEV 1ST LESION IMG BX SPEC US  GUIDE 03/05/2023 [facility]   CATARACT EXTRACTION W/PHACO Left 09/10/2022   Procedure: CATARACT EXTRACTION PHACO AND INTRAOCULAR LENS PLACEMENT (IOC) LEFT  5.88  00:31.9;  Surgeon: Jaye Fallow, MD;  Location: [facility];  Service: Ophthalmology;  Laterality: Left;  sleep apnea   COLONOSCOPY     COLONOSCOPY WITH PROPOFOL  N/A 03/10/2017   Procedure: COLONOSCOPY WITH PROPOFOL ;  Surgeon: Gaylyn Gladis PENNER, MD;   Location: [facility] ENDOSCOPY;  Service: Endoscopy;  Laterality: N/A;   DILATION AND CURETTAGE OF UTERUS     ESOPHAGOGASTRODUODENOSCOPY N/A 12/05/2023   Procedure: EGD (ESOPHAGOGASTRODUODENOSCOPY);  Surgeon: Maryruth Ole DASEN, MD;  Location: [facility] ENDOSCOPY;  Service: Endoscopy;  Laterality: N/A;   ESOPHAGOGASTRODUODENOSCOPY (EGD) WITH PROPOFOL  N/A 02/02/2019   Procedure: ESOPHAGOGASTRODUODENOSCOPY (EGD) WITH PROPOFOL ;  Surgeon: Toledo, Ladell POUR, MD;  Location: [facility] ENDOSCOPY;  Service: Endoscopy;  Laterality: N/A;   EYE SURGERY     IR EMBO ART  VEN HEMORR LYMPH EXTRAV  INC GUIDE ROADMAPPING  11/20/2023   IVC FILTER INSERTION N/A 06/11/2016   Procedure: IVC Filter Insertion;  Surgeon: Cordella KANDICE Shawl, MD;  Location: [facility]VAS[facility] CV LAB;  Service: Cardiovascular;  Laterality: N/A;   IVC FILTER INSERTION N/A  11/27/2023   Procedure: IVC FILTER INSERTION;  Surgeon: Marea Selinda RAMAN, MD;  Location: [facility];  Service: Cardiovascular;  Laterality: N/A;   IVC FILTER REMOVAL N/A 05/06/2017   Procedure: IVC FILTER REMOVAL;  Surgeon: Jama Cordella MATSU, MD;  Location: [facility];  Service: Cardiovascular;  Laterality: N/A;   JOINT REPLACEMENT Left 06/17/2016   Knees, pt stated left and right have been replaced.   LAPAROSCOPIC APPENDECTOMY N/A 10/10/2018   Procedure: APPENDECTOMY LAPAROSCOPIC ATTEMPTED CONVERTED TO OPEN;  Surgeon: Rodolph Romano, MD;  Location: [facility];  Service: General;  Laterality: N/A;   LAPAROSCOPY N/A 10/10/2018   Procedure: LAPAROSCOPY DIAGNOSTIC ATTEMPTED CONVERTED TO OPEN;  Surgeon: Rodolph Romano, MD;  Location: [facility];  Service: General;  Laterality: N/A;   LUMBAR LAMINECTOMY/DECOMPRESSION MICRODISCECTOMY N/A 07/09/2021   Procedure: Laminectomy and Foraminotomy - Lumbar Two-Lumbar Three - Lumbar Three-Lumbar Four - Lumbar Four-Lumbar Five;  Surgeon: Louis Shove, MD;  Location: [facility];  Service: Neurosurgery;  Laterality: N/A;   neck fusion      OPEN REDUCTION INTERNAL FIXATION (ORIF) WRIST WITH ILIAC CREST BONE GRAFT Left    PARTIAL COLECTOMY Right 10/10/2018   Procedure: PARTIAL COLECTOMY;  Surgeon: Rodolph Romano, MD;  Location: [facility];  Service: General;  Laterality: Right;   PERIPHERAL VASCULAR CATHETERIZATION N/A 08/15/2015   Procedure: pulmonary angiogram with lysis;  Surgeon: Cordella MATSU Jama, MD;  Location: [facility] INVASIVE CV LAB;  Service: Cardiovascular;  Laterality: N/A;   WRIST SURGERY Left 2000   Past Surgical History:  Procedure Laterality Date   ANTERIOR LATERAL LUMBAR FUSION WITH PERCUTANEOUS SCREW 2 LEVEL Left 01/05/2024   Procedure: ANTERIOR LATERAL LUMBAR FUSION WITH PERCUTANEOUS SCREW LUMBAR THREE-FOUR, LUMBAR FOUR-FIVE WITH PERCUTANEOUS PEDICLE SCREWS;  Surgeon: Louis Shove, MD;  Location: [facility];  Service: Neurosurgery;  Laterality: Left;   APPENDECTOMY     BREAST BIOPSY Left 08/19/2004   lt bx/clip-neg   BREAST BIOPSY Left 03/05/2023   US  BX heart clip, path pending-   BREAST BIOPSY Left 03/05/2023   US  BX hydromark coil clip axilla node- path pending   BREAST BIOPSY Left 03/05/2023   US  LT BREAST BX W LOC DEV 1ST LESION IMG BX SPEC US  GUIDE 03/05/2023 [facility]   CATARACT EXTRACTION W/PHACO Left 09/10/2022   Procedure: CATARACT EXTRACTION PHACO AND INTRAOCULAR LENS PLACEMENT (IOC) LEFT  5.88  00:31.9;  Surgeon: Jaye Fallow, MD;  Location: [facility];  Service: Ophthalmology;  Laterality: Left;  sleep apnea   COLONOSCOPY     COLONOSCOPY WITH PROPOFOL  N/A 03/10/2017   Procedure: COLONOSCOPY WITH PROPOFOL ;  Surgeon: Gaylyn Gladis PENNER, MD;  Location: [facility] ENDOSCOPY;  Service: Endoscopy;  Laterality: N/A;   DILATION AND CURETTAGE OF UTERUS     ESOPHAGOGASTRODUODENOSCOPY N/A 12/05/2023   Procedure: EGD (ESOPHAGOGASTRODUODENOSCOPY);  Surgeon: Maryruth Ole DASEN, MD;  Location: [facility] ENDOSCOPY;  Service: Endoscopy;  Laterality: N/A;   ESOPHAGOGASTRODUODENOSCOPY (EGD) WITH PROPOFOL  N/A  02/02/2019   Procedure: ESOPHAGOGASTRODUODENOSCOPY (EGD) WITH PROPOFOL ;  Surgeon: Toledo, Ladell POUR, MD;  Location: [facility];  Service: Endoscopy;  Laterality: N/A;   EYE SURGERY     IR EMBO ART  VEN HEMORR LYMPH EXTRAV  INC GUIDE ROADMAPPING  11/20/2023   IVC FILTER INSERTION N/A 06/11/2016   Procedure: IVC Filter Insertion;  Surgeon: Cordella MATSU Jama, MD;  Location: [facility];  Service: Cardiovascular;  Laterality: N/A;   IVC FILTER INSERTION N/A 11/27/2023   Procedure: IVC FILTER INSERTION;  Surgeon: Marea Selinda RAMAN, MD;  Location:  [facility];  Service: Cardiovascular;  Laterality: N/A;   IVC FILTER REMOVAL N/A 05/06/2017   Procedure: IVC FILTER REMOVAL;  Surgeon: Jama Cordella MATSU, MD;  Location: [facility];  Service: Cardiovascular;  Laterality: N/A;   JOINT REPLACEMENT Left 06/17/2016   Knees, pt stated left and right have been replaced.   LAPAROSCOPIC APPENDECTOMY N/A 10/10/2018   Procedure: APPENDECTOMY LAPAROSCOPIC ATTEMPTED CONVERTED TO OPEN;  Surgeon: Rodolph Romano, MD;  Location: [facility];  Service: General;  Laterality: N/A;   LAPAROSCOPY N/A 10/10/2018   Procedure: LAPAROSCOPY DIAGNOSTIC ATTEMPTED CONVERTED TO OPEN;  Surgeon: Rodolph Romano, MD;  Location: [facility];  Service: General;  Laterality: N/A;   LUMBAR LAMINECTOMY/DECOMPRESSION MICRODISCECTOMY N/A 07/09/2021   Procedure: Laminectomy and Foraminotomy - Lumbar Two-Lumbar Three - Lumbar Three-Lumbar Four - Lumbar Four-Lumbar Five;  Surgeon: Louis Shove, MD;  Location: [facility];  Service: Neurosurgery;  Laterality: N/A;   neck fusion     OPEN REDUCTION INTERNAL FIXATION (ORIF) WRIST WITH ILIAC CREST BONE GRAFT Left    PARTIAL COLECTOMY Right 10/10/2018   Procedure: PARTIAL COLECTOMY;  Surgeon: Rodolph Romano, MD;  Location: [facility];  Service: General;  Laterality: Right;   PERIPHERAL VASCULAR CATHETERIZATION N/A 08/15/2015   Procedure: pulmonary angiogram with lysis;   Surgeon: Cordella MATSU Jama, MD;  Location: [facility] INVASIVE CV LAB;  Service: Cardiovascular;  Laterality: N/A;   WRIST SURGERY Left 2000   Past Medical History:  Diagnosis Date   Adnexal cyst    Anginal pain    atypical chest pain   Anxiety    Arthritis    Back pain    Cancer (HCC)    cll   Chronic kidney disease    CLL (chronic lymphocytic leukemia) (HCC)    DDD (degenerative disc disease), lumbar    Depression    Epigastric pain    GERD (gastroesophageal reflux disease)    Headache    Hemorrhoids    History of Bell's palsy    Hypercholesteremia    Hyperlipidemia    Hypertension    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)    Macular degeneration    MGUS (monoclonal gammopathy of unknown significance)    Morbid obesity (HCC)    Obesity    Pre-diabetes    Pulmonary embolism (HCC)    PVD (peripheral vascular disease)    RLS (restless legs syndrome)    Sleep apnea    CPAP   BP 120/77   Pulse 100   Ht 5' 4 (1.626 m)   Wt 237 lb (107.5 kg)   SpO2 90%   BMI 40.68 kg/m   Opioid Risk Score:   Fall Risk Score:  `1  Depression screen [facility] 2/9     04/26/2024    3:57 PM 04/07/2024    8:51 AM 03/16/2024    8:33 AM 07/04/2023    2:03 PM 03/31/2023    2:03 PM 09/23/2022    1:07 PM 06/24/2022    1:46 PM  Depression screen PHQ 2/9  Decreased Interest 0 0 0 3 0 0 1  Down, Depressed, Hopeless 0 0 0 3 0 0 1  PHQ - 2 Score 0 0 0 6 0 0 2     Review of Systems  Musculoskeletal:        Pain in right buttock going down back leg  All other systems reviewed and are negative.      Objective:   Physical Exam  General no acute distress mood and affect  slightly anxious Has some pain when getting up and down from the exam table she can ambulate with a rolling walker which is her baseline She has tenderness over the right PSIS tenderness around incision as well as healed.  There is no swelling or fluctuance around the lumbar incision. Negative straight leg raising  bilaterally She has some difficulty lying totally flat due to back pain. Gaenslens: Negative Sacral thrust (prone) : Positive right Lateral compression: Negative FABER's: Positive right Distraction (supine): Negative Thigh thrust test: Positive right  Motor strength is 4/5 bilateral hip flexor knee extensor ankle dorsiflexor    Assessment & Plan:   Assessment and Plan Assessment & Plan Sacroiliac joint arthritis Confirmed significant right sacroiliac joint involvement with pain distinct from prior lumbar symptoms. Coordination with spine surgeon required before sacroiliac joint injection due to recent lumbar fusion. - Sent message to spine surgeon Dr. Louis to confirm appropriateness of sacroiliac joint injection post follow-up.  This was done and Dr. Louis agrees with SI injection  - Administered Toradol  injection for acute pain relief.  Status post lumbar spinal fusion for spondylolisthesis and disc degeneration Healing phase post L3-4 and L4-5 fusion with intact hardware. Corticosteroid injections contraindicated due to potential impact on bone healing. - Advised against additional corticosteroid injections. - Reinforced importance of calcium  and vitamin D  supplementation for bone health and fusion healing.    "

## 2024-05-06 NOTE — Patient Instructions (Signed)
" °  VISIT SUMMARY: Today, we discussed your new right buttock pain radiating into the posterior thigh, which started about two and a half weeks ago. We also reviewed your recent lumbar spinal fusion and chronic right shoulder pain.  YOUR PLAN: SACROILIAC JOINT ARTHRITIS: You have significant right sacroiliac joint arthritis causing pain distinct from your prior lumbar symptoms. -We have sent a message to your spine surgeon, Dr. Malcolm, to confirm if a sacroiliac joint injection is appropriate after your recent lumbar fusion. -You received a Toradol  injection today for acute pain relief.  STATUS POST LUMBAR SPINAL FUSION FOR SPONDYLOLISTHESIS AND DISC DEGENERATION: You are in the healing phase after your L3-4 and L4-5 fusion surgery, and your hardware is intact. -Avoid additional corticosteroid injections as they may affect bone healing. -Continue taking calcium  and vitamin D  supplements to support bone health and fusion healing. -We will communicate with your spine surgeon regarding the safety of a sacroiliac joint injection after your fusion surgery.                      Contains text generated by [employer]bridge.                                 Contains text generated by Abridge.   "

## 2024-05-11 ENCOUNTER — Encounter: Payer: Self-pay | Admitting: Nurse Practitioner

## 2024-05-11 ENCOUNTER — Inpatient Hospital Stay: Admitting: Nurse Practitioner

## 2024-05-11 ENCOUNTER — Inpatient Hospital Stay

## 2024-05-11 VITALS — BP 121/77 | HR 82 | Temp 96.7°F | Resp 16 | Wt 231.0 lb

## 2024-05-11 DIAGNOSIS — C9110 Chronic lymphocytic leukemia of B-cell type not having achieved remission: Secondary | ICD-10-CM | POA: Diagnosis not present

## 2024-05-11 DIAGNOSIS — Z86718 Personal history of other venous thrombosis and embolism: Secondary | ICD-10-CM | POA: Diagnosis not present

## 2024-05-11 DIAGNOSIS — D500 Iron deficiency anemia secondary to blood loss (chronic): Secondary | ICD-10-CM

## 2024-05-11 DIAGNOSIS — D508 Other iron deficiency anemias: Secondary | ICD-10-CM

## 2024-05-11 LAB — CBC WITH DIFFERENTIAL (CANCER CENTER ONLY)
Abs Immature Granulocytes: 0.06 K/uL (ref 0.00–0.07)
Basophils Absolute: 0.1 K/uL (ref 0.0–0.1)
Basophils Relative: 1 %
Eosinophils Absolute: 0.2 K/uL (ref 0.0–0.5)
Eosinophils Relative: 1 %
HCT: 43.4 % (ref 36.0–46.0)
Hemoglobin: 13 g/dL (ref 12.0–15.0)
Immature Granulocytes: 0 %
Lymphocytes Relative: 44 %
Lymphs Abs: 6.2 K/uL — ABNORMAL HIGH (ref 0.7–4.0)
MCH: 26.2 pg (ref 26.0–34.0)
MCHC: 30 g/dL (ref 30.0–36.0)
MCV: 87.3 fL (ref 80.0–100.0)
Monocytes Absolute: 0.8 K/uL (ref 0.1–1.0)
Monocytes Relative: 6 %
Neutro Abs: 6.8 K/uL (ref 1.7–7.7)
Neutrophils Relative %: 48 %
Platelet Count: 219 K/uL (ref 150–400)
RBC: 4.97 MIL/uL (ref 3.87–5.11)
RDW: 26.6 % — ABNORMAL HIGH (ref 11.5–15.5)
WBC Count: 14.2 K/uL — ABNORMAL HIGH (ref 4.0–10.5)
nRBC: 0 % (ref 0.0–0.2)

## 2024-05-11 LAB — CMP (CANCER CENTER ONLY)
ALT: 11 U/L (ref 0–44)
AST: 16 U/L (ref 15–41)
Albumin: 4.1 g/dL (ref 3.5–5.0)
Alkaline Phosphatase: 114 U/L (ref 38–126)
Anion gap: 11 (ref 5–15)
BUN: 22 mg/dL (ref 8–23)
CO2: 28 mmol/L (ref 22–32)
Calcium: 9.7 mg/dL (ref 8.9–10.3)
Chloride: 104 mmol/L (ref 98–111)
Creatinine: 0.95 mg/dL (ref 0.44–1.00)
GFR, Estimated: 59 mL/min — ABNORMAL LOW
Glucose, Bld: 111 mg/dL — ABNORMAL HIGH (ref 70–99)
Potassium: 3.7 mmol/L (ref 3.5–5.1)
Sodium: 144 mmol/L (ref 135–145)
Total Bilirubin: <0.2 mg/dL (ref 0.0–1.2)
Total Protein: 6.6 g/dL (ref 6.5–8.1)

## 2024-05-11 LAB — IRON AND TIBC
Iron: 60 ug/dL (ref 28–170)
Saturation Ratios: 18 % (ref 10.4–31.8)
TIBC: 326 ug/dL (ref 250–450)
UIBC: 267 ug/dL

## 2024-05-11 LAB — FERRITIN: Ferritin: 166 ng/mL (ref 11–307)

## 2024-05-11 NOTE — Progress Notes (Signed)
 [facility] OFFICE PROGRESS NOTE  Patient Care Team: Lenon Layman ORN, MD as PCP - General (Internal Medicine) Dessa, Reyes ORN, MD (General Surgery) Meeler, Benton CROME, FNP as Referring Physician (Family Medicine) Rennie Cindy SAUNDERS, MD as Consulting Physician (Oncology); Dr.harold Maryl;    SUMMARY OF ONCOLOGIC HISTORY:  Oncology History Overview Note  # April 2017- Bil Massive Pulmonary embolus/R LE DVT- s/p Thrombolysis [Dr.Schnier, [facility]]-Eliquis ; NEG factor V leide/ Prothrombin gene. S/p IV Filter explantation.    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  2) CD56 expression on monocytes.   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  --------------------------------------------------------  DIAGNOSIS: CLL  STAGE: I        ;GOALS: COntrol  CURRENT/MOST RECENT THERAPY: surveillaince    CLL (chronic lymphocytic leukemia) (HCC)     INTERVAL HISTORY: Alone.  Ambulating with a rolling walker.   A pleasant 84 year-old female patient with above history of History of CLL/SLL- on surveillance; and history of bilateral PE-   Her case has been complicated with a couple of different hospitalizations since July.  She had a hospitalization for a GI bleed in July.  Diverticular bleed was seen active on her CT scan in July status post embolization of small active bleed in the sigmoid colon.  At that time she required hospitalization and a unit of packed red blood cells.  Today she denies any ongoing fever chills or abdominal pain.    Second hospitalization was for degenerative  spondylolisthesis where she underwent an uncomplicated left-sided L3-L4 and L4-L5 decompression and fusion.  This has healed but she continues to endorse chronic back pain.  Since the GI bleed and her surgery she has remained anemic.  Ferritin and Iron  and TIBC confirmed IDA in Nov.  She has tolerated IV venofer  well and reports overall feeling better.  Hemoglobin 13 today.    She reports continued very dark since starting PO Iron  overall able to tolerate po iron  ok.  Will continue to monitor this.  Hg improving.  No other symptoms of GI bleed.  No s/sx of infection at this time as well.  Patient had a CT of chest completed in ER back in Dec.  Dr. Rennie has reviewed scan I informed patient that there was no signs of disease progression of CLL on that scan.  She verbalizes understanding.   Review of Systems  Constitutional:  Positive for malaise/fatigue. Negative for chills, diaphoresis, fever and weight loss.  HENT:  Negative for nosebleeds and sore throat.   Eyes:  Negative for double vision.  Respiratory:  Negative for cough, hemoptysis, sputum production, shortness of breath and wheezing.   Cardiovascular:  Negative for chest pain, palpitations, orthopnea and leg swelling.  Gastrointestinal:  Positive for diarrhea. Negative for abdominal pain, blood in stool, constipation, heartburn, melena, nausea and vomiting.  Genitourinary:  Negative for dysuria, frequency and urgency.  Musculoskeletal:  Positive for joint pain. Negative for back pain and neck pain.  Skin: Negative.  Negative for itching and rash.  Neurological:  Negative for dizziness, tingling, focal weakness, weakness and headaches.  Endo/Heme/Allergies:  Does not bruise/bleed easily.  Psychiatric/Behavioral:  Negative for depression. The patient  is not nervous/anxious and does not have insomnia.     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)    Macular degeneration    MGUS (monoclonal gammopathy of unknown significance)    Morbid obesity (HCC)    Obesity    Pre-diabetes    Pulmonary embolism (HCC)    PVD (peripheral vascular disease)    RLS (restless legs syndrome)    Sleep apnea    CPAP    PAST SURGICAL HISTORY :   Past Surgical History:  Procedure Laterality Date   ANTERIOR LATERAL LUMBAR FUSION WITH PERCUTANEOUS SCREW 2 LEVEL Left 01/05/2024   Procedure: ANTERIOR LATERAL LUMBAR FUSION WITH PERCUTANEOUS SCREW LUMBAR THREE-FOUR, LUMBAR FOUR-FIVE WITH PERCUTANEOUS PEDICLE SCREWS;  Surgeon: Louis Shove, MD;  Location: [facility] OR;  Service: Neurosurgery;  Laterality: Left;   APPENDECTOMY     BREAST BIOPSY Left 08/19/2004   lt bx/clip-neg   BREAST BIOPSY Left 03/05/2023   US  BX heart clip, path pending-   BREAST BIOPSY Left 03/05/2023   US  BX hydromark coil clip axilla node- path pending   BREAST BIOPSY Left 03/05/2023   US  LT BREAST BX W LOC DEV 1ST LESION IMG BX SPEC US  GUIDE 03/05/2023 [facility]   CATARACT EXTRACTION W/PHACO Left 09/10/2022   Procedure: CATARACT EXTRACTION PHACO AND INTRAOCULAR LENS PLACEMENT (IOC) LEFT  5.88  00:31.9;  Surgeon: Jaye Fallow, MD;  Location: [facility];  Service: Ophthalmology;  Laterality: Left;  sleep apnea   COLONOSCOPY     COLONOSCOPY WITH PROPOFOL  N/A 03/10/2017   Procedure: COLONOSCOPY WITH PROPOFOL ;  Surgeon: Gaylyn Gladis PENNER, MD;  Location: [facility] ENDOSCOPY;  Service: Endoscopy;  Laterality: N/A;   DILATION AND CURETTAGE OF UTERUS      ESOPHAGOGASTRODUODENOSCOPY N/A 12/05/2023   Procedure: EGD (ESOPHAGOGASTRODUODENOSCOPY);  Surgeon: Maryruth Ole DASEN, MD;  Location: [facility] ENDOSCOPY;  Service: Endoscopy;  Laterality: N/A;   ESOPHAGOGASTRODUODENOSCOPY (EGD) WITH PROPOFOL  N/A 02/02/2019   Procedure: ESOPHAGOGASTRODUODENOSCOPY (EGD) WITH PROPOFOL ;  Surgeon: Toledo, Ladell POUR, MD;  Location: [facility] ENDOSCOPY;  Service: Endoscopy;  Laterality: N/A;   EYE SURGERY     IR EMBO ART  VEN HEMORR LYMPH EXTRAV  INC GUIDE ROADMAPPING  11/20/2023   IVC FILTER INSERTION N/A 06/11/2016   Procedure: IVC Filter Insertion;  Surgeon: Cordella KANDICE Shawl, MD;  Location: [facility] INVASIVE CV LAB;  Service: Cardiovascular;  Laterality: N/A;   IVC FILTER INSERTION N/A 11/27/2023   Procedure: IVC FILTER INSERTION;  Surgeon: Marea Selinda RAMAN, MD;  Location: [facility] INVASIVE CV LAB;  Service: Cardiovascular;  Laterality: N/A;   IVC FILTER REMOVAL N/A 05/06/2017   Procedure: IVC FILTER REMOVAL;  Surgeon: Shawl Cordella KANDICE, MD;  Location: [facility] INVASIVE CV LAB;  Service: Cardiovascular;  Laterality: N/A;   JOINT REPLACEMENT Left 06/17/2016   Knees, pt stated left and right have been replaced.  LAPAROSCOPIC APPENDECTOMY N/A 10/10/2018   Procedure: APPENDECTOMY LAPAROSCOPIC ATTEMPTED CONVERTED TO OPEN;  Surgeon: Rodolph Romano, MD;  Location: [facility] ORS;  Service: General;  Laterality: N/A;   LAPAROSCOPY N/A 10/10/2018   Procedure: LAPAROSCOPY DIAGNOSTIC ATTEMPTED CONVERTED TO OPEN;  Surgeon: Rodolph Romano, MD;  Location: [facility] ORS;  Service: General;  Laterality: N/A;   LUMBAR LAMINECTOMY/DECOMPRESSION MICRODISCECTOMY N/A 07/09/2021   Procedure: Laminectomy and Foraminotomy - Lumbar Two-Lumbar Three - Lumbar Three-Lumbar Four - Lumbar Four-Lumbar Five;  Surgeon: Louis Shove, MD;  Location: [facility] OR;  Service: Neurosurgery;  Laterality: N/A;   neck fusion     OPEN REDUCTION INTERNAL FIXATION (ORIF) WRIST WITH ILIAC CREST BONE GRAFT Left    PARTIAL COLECTOMY Right  10/10/2018   Procedure: PARTIAL COLECTOMY;  Surgeon: Rodolph Romano, MD;  Location: [facility] ORS;  Service: General;  Laterality: Right;   PERIPHERAL VASCULAR CATHETERIZATION N/A 08/15/2015   Procedure: pulmonary angiogram with lysis;  Surgeon: Cordella KANDICE Shawl, MD;  Location: [facility] INVASIVE CV LAB;  Service: Cardiovascular;  Laterality: N/A;   WRIST SURGERY Left 2000    FAMILY HISTORY :   Family History  Problem Relation Age of Onset   Pulmonary embolism Mother    Sudden death Mother    Obesity Mother    Heart failure Father    Congestive Heart Failure Father    Sudden death Father    Thyroid  cancer Brother    Breast cancer Neg Hx     SOCIAL HISTORY:   Social History   Tobacco Use   Smoking status: Never   Smokeless tobacco: Never  Vaping Use   Vaping status: Never Used  Substance Use Topics   Alcohol  use: No    Alcohol /week: 0.0 standard drinks of alcohol    Drug use: No    ALLERGIES:  has no known allergies.  MEDICATIONS:  Current Outpatient Medications  Medication Sig Dispense Refill   acetaminophen  (TYLENOL ) 650 MG CR tablet Take 650-1,300 mg by mouth every 8 (eight) hours as needed for pain. TYLENOL  ARTHRITIS     apixaban  (ELIQUIS ) 2.5 MG TABS tablet Take 2.5 mg by mouth.     b complex vitamins capsule Take 1 capsule by mouth in the morning.     calcium  carbonate (OSCAL) 1500 (600 Ca) MG TABS tablet Take 600 mg of elemental calcium  by mouth in the morning.     Cholecalciferol (VITAMIN D -3) 125 MCG (5000 UT) TABS Take 5,000 Units by mouth in the morning.     cholestyramine  (QUESTRAN ) 4 g packet Take 1 packet (4 g total) by mouth 3 (three) times daily with meals. (Patient taking differently: Take 4 g by mouth daily before supper.) 60 each 0   Cyanocobalamin  (VITAMIN B-12 PO) Take 1 tablet by mouth in the morning.     DULoxetine  (CYMBALTA ) 60 MG capsule Take 1 capsule (60 mg total) by mouth daily. With Other SSRI 90 capsule 3   escitalopram  (LEXAPRO ) 20 MG tablet  Take 20 mg by mouth in the morning.     gabapentin  (NEURONTIN ) 400 MG capsule Take 1 capsule (400 mg total) by mouth 4 (four) times daily. Take 2 pills in Am and 1 pills at night- for nerve pain 360 capsule 1   gabapentin  (NEURONTIN ) 400 MG capsule Take 1 capsule (400 mg total) by mouth 4 (four) times daily. Pt takes 2 tabs in AM, 1 tab in evening, and if needed takes 1 additional tab daily- so 4 tabs/day- for nerve pain 360 capsule 1   gabapentin  (NEURONTIN ) 400  MG capsule Take 1 capsule (400 mg total) by mouth 4 (four) times daily for 10 days. 40 capsule 0   MAGNESIUM  PO Take 1 tablet by mouth in the morning.     methocarbamol  (ROBAXIN ) 500 MG tablet TAKE 1 TABLET (500 MG TOTAL) EVERY 8 (EIGHT) HOURS AS NEEDED FOR MUSCLE SPASMS. (Patient taking differently: Take 500 mg by mouth See admin instructions. Take 1 tablet (500 mg) by mouth scheduled every morning, may take an additional dose (500 mg) by mouth if needed for muscle spasms.) 180 tablet 5   Multiple Vitamins-Minerals (PRESERVISION AREDS 2 PO) Take 1 tablet by mouth in the morning and at bedtime.     naphazoline-glycerin  (CLEAR EYES REDNESS) 0.012-0.25 % SOLN Place 1-2 drops into both eyes 4 (four) times daily as needed for eye irritation.     oxyCODONE  (ROXICODONE ) 5 MG immediate release tablet Take 1-2 tablets (5-10 mg total) by mouth every 6 (six) hours as needed for severe pain (pain score 7-10). acute on chronic pain- G89.4 90 tablet 0   pantoprazole  (PROTONIX ) 40 MG tablet Take 40 mg by mouth See admin instructions. Take 1 tablet (40 mg) by mouth scheduled every morning, may take an additional dose (40 mg) by mouth if needed for acid reflux/indigestion.     pravastatin  (PRAVACHOL ) 40 MG tablet Take 40 mg by mouth in the morning.     sucralfate  (CARAFATE ) 1 g tablet Take 1 g by mouth 3 (three) times daily before meals.     TURMERIC CURCUMIN PO Take 1 capsule by mouth in the morning.     No current facility-administered medications for this  visit.    PHYSICAL EXAMINATION:   BP 121/77 (BP Location: Left Arm, Patient Position: Sitting, Cuff Size: Large)   Pulse 82   Temp (!) 96.7 F (35.9 C) (Tympanic)   Resp 16   Wt 231 lb (104.8 kg)   SpO2 93%   PF 93 L/min   BMI 39.65 kg/m   Filed Weights   05/11/24 1019  Weight: 231 lb (104.8 kg)       Physical Exam Constitutional:      Comments: Obese.  Walks by herself.  HENT:     Head: Normocephalic and atraumatic.     Mouth/Throat:     Pharynx: No oropharyngeal exudate.  Eyes:     Pupils: Pupils are equal, round, and reactive to light.  Cardiovascular:     Rate and Rhythm: Normal rate and regular rhythm.  Pulmonary:     Effort: No respiratory distress.     Breath sounds: No wheezing.  Abdominal:     General: Bowel sounds are normal. There is no distension.     Palpations: Abdomen is soft. There is no mass.     Tenderness: There is no abdominal tenderness. There is no guarding or rebound.  Musculoskeletal:        General: No tenderness. Normal range of motion.     Cervical back: Normal range of motion and neck supple.  Skin:    General: Skin is warm.     Comments: Chronic bruises.  Neurological:     Mental Status: She is alert and oriented to person, place, and time.  Psychiatric:        Mood and Affect: Affect normal.      LABORATORY DATA:  I have reviewed the data as listed    Component Value Date/Time   NA 144 05/11/2024 1004   NA 142 12/26/2020 1112   NA 141 04/29/2012  1822   K 3.7 05/11/2024 1004   K 3.9 04/29/2012 1822   CL 104 05/11/2024 1004   CL 107 04/29/2012 1822   CO2 28 05/11/2024 1004   CO2 28 04/29/2012 1822   GLUCOSE 111 (H) 05/11/2024 1004   GLUCOSE 129 (H) 04/29/2012 1822   BUN 22 05/11/2024 1004   BUN 15 12/26/2020 1112   BUN 21 (H) 04/29/2012 1822   CREATININE 0.95 05/11/2024 1004   CREATININE 1.02 (H) 07/28/2014 0950   CALCIUM  9.7 05/11/2024 1004   CALCIUM  8.7 04/29/2012 1822   PROT 6.6 05/11/2024 1004   PROT 5.8  (L) 12/26/2020 1112   PROT 7.1 04/29/2012 1822   ALBUMIN  4.1 05/11/2024 1004   ALBUMIN  4.0 12/26/2020 1112   ALBUMIN  3.3 (L) 04/29/2012 1822   AST 16 05/11/2024 1004   ALT 11 05/11/2024 1004   ALT 33 04/29/2012 1822   ALKPHOS 114 05/11/2024 1004   ALKPHOS 169 (H) 04/29/2012 1822   BILITOT <0.2 05/11/2024 1004   GFRNONAA 59 (L) 05/11/2024 1004   GFRNONAA 55 (L) 07/28/2014 0950   GFRAA 55 (L) 09/03/2019 0830   GFRAA >60 07/28/2014 0950    No results found for: SPEP, UPEP  Lab Results  Component Value Date   WBC 14.2 (H) 05/11/2024   NEUTROABS 6.8 05/11/2024   HGB 13.0 05/11/2024   HCT 43.4 05/11/2024   MCV 87.3 05/11/2024   PLT 219 05/11/2024      Latest Ref Rng & Units 05/11/2024   10:03 AM 04/13/2024   12:20 PM 03/15/2024   10:40 AM  CBC EXTENDED  WBC 4.0 - 10.5 K/uL 14.2  10.2  9.9   RBC 3.87 - 5.11 MIL/uL 4.97  4.45  4.11   Hemoglobin 12.0 - 15.0 g/dL 86.9  89.3  8.7   HCT 63.9 - 46.0 % 43.4  36.8  30.5   Platelets 150 - 400 K/uL 219  255  336   NEUT# 1.7 - 7.7 K/uL 6.8  5.3  4.9   Lymph# 0.7 - 4.0 K/uL 6.2  4.1  4.2        Chemistry      Component Value Date/Time   NA 144 05/11/2024 1004   NA 142 12/26/2020 1112   NA 141 04/29/2012 1822   K 3.7 05/11/2024 1004   K 3.9 04/29/2012 1822   CL 104 05/11/2024 1004   CL 107 04/29/2012 1822   CO2 28 05/11/2024 1004   CO2 28 04/29/2012 1822   BUN 22 05/11/2024 1004   BUN 15 12/26/2020 1112   BUN 21 (H) 04/29/2012 1822   CREATININE 0.95 05/11/2024 1004   CREATININE 1.02 (H) 07/28/2014 0950      Component Value Date/Time   CALCIUM  9.7 05/11/2024 1004   CALCIUM  8.7 04/29/2012 1822   ALKPHOS 114 05/11/2024 1004   ALKPHOS 169 (H) 04/29/2012 1822   AST 16 05/11/2024 1004   ALT 11 05/11/2024 1004   ALT 33 04/29/2012 1822   BILITOT <0.2 05/11/2024 1004            Component Ref Range & Units (hover) 1 d ago (03/15/24) 5 yr ago (12/18/18) 5 yr ago (12/17/18) 5 yr ago (12/16/18) 5 yr ago (12/15/18)   Ferritin 19 86 CM 94 CM 85      (hover) 1 d ago 3 mo ago  Iron  21 Low  31  TIBC 424 291  Saturation Ratios 5 Low  11  UIBC 403 260     ASSESSMENT &  PLAN:  CLL (chronic lymphocytic leukemia) (HCC) # APRIL 2017- Massive bilateral Pulmonary Embolus-on indefinite anticoagulation.  Continue Eliquis - Refill Eliquis  with Dr.Anderson.  No concerns for bleeding. See below re: falls- consider decreasing dose of eliquis  next visit- no other falls no changes   # Chronic low-grade B-cell lymphoproliferative disorder/CLL-hemoglobin platelets normal.  Platelets WNL at 219 today WBC elevated without s/sx of infection ALC elevated at 6.2 Asymptomatic/continue surveillance- Mammogram/Left breast/ left AX LN:    -SMALL LYMPHOCYTIC LYMPHOMA.  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  Repeat CT scan ordered for Jan will keep this scheduled  # GI hemorrhage/IDA:  Hg 13.0 today no symptoms of GI bleed, ferritin and iron  studies pending hold off on venofer  today f/u with Dr. KATHEE in Feb as previously scheduled  # Degenerative spondylolisthesis: Status post left-sided L3-L4 and L4-L5 decompression and fusion in September.  Continues to follow-up with neurology.  Defer to them.     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 JULY 2025- 0.3gm/dl; K/l=7- stable- monitor for now- repeat in Feb           Follow up plan: No venofer  today F/U per IS with Dr. KATHEE LP          Morna Husband, NP 05/11/2024 1:05 PM

## 2024-05-20 ENCOUNTER — Inpatient Hospital Stay: Admission: RE | Admit: 2024-05-20 | Source: Ambulatory Visit

## 2024-05-21 ENCOUNTER — Other Ambulatory Visit: Payer: Self-pay | Admitting: Neurosurgery

## 2024-05-21 DIAGNOSIS — M4310 Spondylolisthesis, site unspecified: Secondary | ICD-10-CM

## 2024-05-26 ENCOUNTER — Telehealth: Payer: Self-pay | Admitting: Registered Nurse

## 2024-05-26 ENCOUNTER — Telehealth: Payer: Self-pay | Admitting: *Deleted

## 2024-05-26 DIAGNOSIS — M545 Low back pain, unspecified: Secondary | ICD-10-CM

## 2024-05-26 DIAGNOSIS — M47817 Spondylosis without myelopathy or radiculopathy, lumbosacral region: Secondary | ICD-10-CM

## 2024-05-26 DIAGNOSIS — G894 Chronic pain syndrome: Secondary | ICD-10-CM

## 2024-05-26 MED ORDER — OXYCODONE HCL 5 MG PO TABS: 5.0000 mg | ORAL_TABLET | Freq: Four times a day (QID) | ORAL | 0 refills | Status: AC | PRN

## 2024-05-26 NOTE — Telephone Encounter (Signed)
 Jamie Phillips is calling for a refill on her oxycodone  to [facility] in [place].

## 2024-05-26 NOTE — Telephone Encounter (Signed)
 PDMP was Reviewed,  Oxycodone  e-scribed to pharmacy.  Call placed to Jamie Phillips, no answer. Left message to return the call.

## 2024-05-27 ENCOUNTER — Other Ambulatory Visit

## 2024-05-27 ENCOUNTER — Inpatient Hospital Stay: Admitting: Internal Medicine

## 2024-06-09 ENCOUNTER — Other Ambulatory Visit

## 2024-06-11 ENCOUNTER — Encounter: Admitting: Physical Medicine & Rehabilitation

## 2024-06-16 ENCOUNTER — Inpatient Hospital Stay: Payer: Self-pay

## 2024-06-16 ENCOUNTER — Inpatient Hospital Stay: Payer: Self-pay | Admitting: Internal Medicine

## 2024-06-18 ENCOUNTER — Encounter: Admitting: Physical Medicine and Rehabilitation
